# Patient Record
Sex: Female | Born: 1995 | Race: Black or African American | Hispanic: No | Smoking: Former smoker
Health system: Southern US, Community
[De-identification: ages and names within clinical notes are randomized; demographics above are authoritative.]

## PROBLEM LIST (undated history)

## (undated) ENCOUNTER — Inpatient Hospital Stay (HOSPITAL_COMMUNITY): Payer: Medicaid Other

## (undated) ENCOUNTER — Ambulatory Visit (HOSPITAL_COMMUNITY): Admission: EM | Disposition: A | Discharge status: Home / Self Care | Payer: Medicaid Other

## (undated) DIAGNOSIS — E119 Type 2 diabetes mellitus without complications: Secondary | ICD-10-CM

## (undated) DIAGNOSIS — F419 Anxiety disorder, unspecified: Secondary | ICD-10-CM

## (undated) DIAGNOSIS — N12 Tubulo-interstitial nephritis, not specified as acute or chronic: Secondary | ICD-10-CM

## (undated) DIAGNOSIS — N83209 Unspecified ovarian cyst, unspecified side: Secondary | ICD-10-CM

## (undated) DIAGNOSIS — R1011 Right upper quadrant pain: Secondary | ICD-10-CM

## (undated) DIAGNOSIS — E876 Hypokalemia: Secondary | ICD-10-CM

## (undated) DIAGNOSIS — E1110 Type 2 diabetes mellitus with ketoacidosis without coma: Secondary | ICD-10-CM

## (undated) HISTORY — PX: NO PAST SURGERIES: SHX2092

## (undated) HISTORY — DX: Right upper quadrant pain: R10.11

---
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
## 2017-01-26 ENCOUNTER — Other Ambulatory Visit: Payer: Self-pay | Admitting: Pediatrics

## 2017-01-26 DIAGNOSIS — E1065 Type 1 diabetes mellitus with hyperglycemia: Secondary | ICD-10-CM

## 2017-01-26 DIAGNOSIS — IMO0002 Reserved for concepts with insufficient information to code with codable children: Secondary | ICD-10-CM

## 2017-02-12 ENCOUNTER — Other Ambulatory Visit: Payer: Self-pay | Admitting: Internal Medicine

## 2017-02-13 ENCOUNTER — Telehealth: Payer: Self-pay | Admitting: Internal Medicine

## 2017-02-13 MED ORDER — INSULIN PEN NEEDLE 31G X 5 MM MISC: 2 refills | Status: DC

## 2017-02-13 NOTE — Telephone Encounter (Signed)
Called mother to discuss no shows and not making follow up appointments to manage DM.   The phone number on file is her mother's. She reports that patient has been going through a lot of things while here. She was being around people she shouldn't be around. Because of this she is currently with her father for the past 1.5 months.  Mother is unsure when she is back. Reports that she has been doing well with her cbgs which are under 160. The only highs is when she eats and does not take her insulin. Mother also reports that she needs more needles. Mother reports that she will speak to patient about coming in. She states that patient is still having a hard time with this diagnosis of DM1.   Mother provided patient's number.  Patient's Phone number: [phone]

## 2017-06-12 ENCOUNTER — Other Ambulatory Visit: Payer: Self-pay | Admitting: Internal Medicine

## 2017-06-12 NOTE — Telephone Encounter (Signed)
Will forward to MD to advise.  We have still been unable to reach patient to get her in the office for an appointment. Jazmin Hartsell,CMA

## 2017-06-13 ENCOUNTER — Emergency Department (HOSPITAL_COMMUNITY): Payer: Medicaid Other

## 2017-06-13 ENCOUNTER — Inpatient Hospital Stay (HOSPITAL_COMMUNITY)
Admission: EM | Admit: 2017-06-13 | Discharge: 2017-06-15 | DRG: 638 | Disposition: A | Discharge status: Home / Self Care | Payer: Medicaid Other | Attending: Family Medicine | Admitting: Family Medicine

## 2017-06-13 ENCOUNTER — Encounter (HOSPITAL_COMMUNITY): Payer: Self-pay

## 2017-06-13 ENCOUNTER — Encounter: Payer: Self-pay | Admitting: Internal Medicine

## 2017-06-13 ENCOUNTER — Other Ambulatory Visit: Payer: Self-pay

## 2017-06-13 DIAGNOSIS — E861 Hypovolemia: Secondary | ICD-10-CM | POA: Diagnosis present

## 2017-06-13 DIAGNOSIS — E109 Type 1 diabetes mellitus without complications: Secondary | ICD-10-CM | POA: Diagnosis not present

## 2017-06-13 DIAGNOSIS — F411 Generalized anxiety disorder: Secondary | ICD-10-CM | POA: Diagnosis present

## 2017-06-13 DIAGNOSIS — Z833 Family history of diabetes mellitus: Secondary | ICD-10-CM

## 2017-06-13 DIAGNOSIS — E1065 Type 1 diabetes mellitus with hyperglycemia: Secondary | ICD-10-CM

## 2017-06-13 DIAGNOSIS — D72828 Other elevated white blood cell count: Secondary | ICD-10-CM | POA: Diagnosis present

## 2017-06-13 DIAGNOSIS — E876 Hypokalemia: Secondary | ICD-10-CM | POA: Diagnosis present

## 2017-06-13 DIAGNOSIS — Z823 Family history of stroke: Secondary | ICD-10-CM

## 2017-06-13 DIAGNOSIS — N179 Acute kidney failure, unspecified: Secondary | ICD-10-CM | POA: Diagnosis present

## 2017-06-13 DIAGNOSIS — E1010 Type 1 diabetes mellitus with ketoacidosis without coma: Secondary | ICD-10-CM | POA: Diagnosis present

## 2017-06-13 DIAGNOSIS — E860 Dehydration: Secondary | ICD-10-CM | POA: Diagnosis present

## 2017-06-13 DIAGNOSIS — IMO0002 Reserved for concepts with insufficient information to code with codable children: Secondary | ICD-10-CM

## 2017-06-13 DIAGNOSIS — B009 Herpesviral infection, unspecified: Secondary | ICD-10-CM | POA: Insufficient documentation

## 2017-06-13 DIAGNOSIS — E108 Type 1 diabetes mellitus with unspecified complications: Secondary | ICD-10-CM | POA: Diagnosis present

## 2017-06-13 DIAGNOSIS — Z794 Long term (current) use of insulin: Secondary | ICD-10-CM | POA: Diagnosis not present

## 2017-06-13 HISTORY — DX: Type 2 diabetes mellitus with ketoacidosis without coma: E11.10

## 2017-06-13 HISTORY — DX: Hypokalemia: E87.6

## 2017-06-13 HISTORY — DX: Tubulo-interstitial nephritis, not specified as acute or chronic: N12

## 2017-06-13 LAB — I-STAT VENOUS BLOOD GAS, ED
Acid-base deficit: 17 mmol/L — ABNORMAL HIGH (ref 0.0–2.0)
BICARBONATE: 8.6 mmol/L — AB (ref 20.0–28.0)
O2 Saturation: 75 %
PCO2 VEN: 22.2 mmHg — AB (ref 44.0–60.0)
PH VEN: 7.195 — AB (ref 7.250–7.430)
PO2 VEN: 48 mmHg — AB (ref 32.0–45.0)
TCO2: 9 mmol/L — ABNORMAL LOW (ref 22–32)

## 2017-06-13 LAB — BASIC METABOLIC PANEL
ANION GAP: 10 (ref 5–15)
ANION GAP: 12 (ref 5–15)
Anion gap: 25 — ABNORMAL HIGH (ref 5–15)
Anion gap: 16 — ABNORMAL HIGH (ref 5–15)
BUN: 19 mg/dL (ref 6–20)
BUN: 16 mg/dL (ref 6–20)
BUN: 10 mg/dL (ref 6–20)
BUN: 8 mg/dL (ref 6–20)
BUN: 7 mg/dL (ref 6–20)
CHLORIDE: 89 mmol/L — AB (ref 101–111)
CHLORIDE: 98 mmol/L — AB (ref 101–111)
CO2: <7 mmol/L — ABNORMAL LOW (ref 22–32)
CO2: 10 mmol/L — ABNORMAL LOW (ref 22–32)
CO2: 15 mmol/L — ABNORMAL LOW (ref 22–32)
CO2: 14 mmol/L — ABNORMAL LOW (ref 22–32)
CO2: 13 mmol/L — ABNORMAL LOW (ref 22–32)
Calcium: 9.7 mg/dL (ref 8.9–10.3)
Calcium: 8.8 mg/dL — ABNORMAL LOW (ref 8.9–10.3)
Calcium: 7.6 mg/dL — ABNORMAL LOW (ref 8.9–10.3)
Calcium: 7.9 mg/dL — ABNORMAL LOW (ref 8.9–10.3)
Calcium: 8.5 mg/dL — ABNORMAL LOW (ref 8.9–10.3)
Chloride: 110 mmol/L (ref 101–111)
Chloride: 108 mmol/L (ref 101–111)
Chloride: 105 mmol/L (ref 101–111)
Creatinine, Ser: 1.88 mg/dL — ABNORMAL HIGH (ref 0.44–1.00)
Creatinine, Ser: 1.66 mg/dL — ABNORMAL HIGH (ref 0.44–1.00)
Creatinine, Ser: 1.02 mg/dL — ABNORMAL HIGH (ref 0.44–1.00)
Creatinine, Ser: 0.88 mg/dL (ref 0.44–1.00)
Creatinine, Ser: 0.92 mg/dL (ref 0.44–1.00)
GFR calc Af Amer: >60 mL/min (ref >60)
GFR, EST AFRICAN AMERICAN: 44 mL/min — AB (ref >60)
GFR, EST AFRICAN AMERICAN: 51 mL/min — AB (ref >60)
GFR, EST NON AFRICAN AMERICAN: 38 mL/min — AB (ref >60)
GFR, EST NON AFRICAN AMERICAN: 44 mL/min — AB (ref >60)
Glucose, Bld: 619 mg/dL (ref 65–99)
Glucose, Bld: 420 mg/dL — ABNORMAL HIGH (ref 65–99)
Glucose, Bld: 170 mg/dL — ABNORMAL HIGH (ref 65–99)
Glucose, Bld: 184 mg/dL — ABNORMAL HIGH (ref 65–99)
Glucose, Bld: 275 mg/dL — ABNORMAL HIGH (ref 65–99)
POTASSIUM: 3.7 mmol/L (ref 3.5–5.1)
POTASSIUM: 3.9 mmol/L (ref 3.5–5.1)
POTASSIUM: 3.8 mmol/L (ref 3.5–5.1)
POTASSIUM: 3.6 mmol/L (ref 3.5–5.1)
POTASSIUM: 3.7 mmol/L (ref 3.5–5.1)
SODIUM: 128 mmol/L — AB (ref 135–145)
SODIUM: 133 mmol/L — AB (ref 135–145)
SODIUM: 134 mmol/L — AB (ref 135–145)
SODIUM: 134 mmol/L — AB (ref 135–145)
Sodium: 135 mmol/L (ref 135–145)

## 2017-06-13 LAB — CBC WITH DIFFERENTIAL/PLATELET
BASOS ABS: 0 10*3/uL (ref 0.0–0.1)
BASOS PCT: 0 %
EOS ABS: 0 10*3/uL (ref 0.0–0.7)
EOS PCT: 0 %
HCT: 46 % (ref 36.0–46.0)
HEMOGLOBIN: 16.3 g/dL — AB (ref 12.0–15.0)
Lymphocytes Relative: 22 %
Lymphs Abs: 2.9 10*3/uL (ref 0.7–4.0)
MCH: 30.5 pg (ref 26.0–34.0)
MCHC: 35.4 g/dL (ref 30.0–36.0)
MCV: 86 fL (ref 78.0–100.0)
Monocytes Absolute: 0.5 10*3/uL (ref 0.1–1.0)
Monocytes Relative: 4 %
NEUTROS PCT: 74 %
Neutro Abs: 9.6 10*3/uL — ABNORMAL HIGH (ref 1.7–7.7)
PLATELETS: 331 10*3/uL (ref 150–400)
RBC: 5.35 MIL/uL — AB (ref 3.87–5.11)
RDW: 14.5 % (ref 11.5–15.5)
WBC: 13 10*3/uL — AB (ref 4.0–10.5)

## 2017-06-13 LAB — URINALYSIS, ROUTINE W REFLEX MICROSCOPIC
BILIRUBIN URINE: NEGATIVE
Glucose, UA: >=500 mg/dL — AB
KETONES UR: 80 mg/dL — AB
NITRITE: NEGATIVE
Protein, ur: 30 mg/dL — AB
SPECIFIC GRAVITY, URINE: 1.025 (ref 1.005–1.030)
pH: 5 (ref 5.0–8.0)

## 2017-06-13 LAB — CBG MONITORING, ED
GLUCOSE-CAPILLARY: 587 mg/dL — AB (ref 65–99)
GLUCOSE-CAPILLARY: 226 mg/dL — AB (ref 65–99)
GLUCOSE-CAPILLARY: 146 mg/dL — AB (ref 65–99)
GLUCOSE-CAPILLARY: 173 mg/dL — AB (ref 65–99)
GLUCOSE-CAPILLARY: 198 mg/dL — AB (ref 65–99)
Glucose-Capillary: 338 mg/dL — ABNORMAL HIGH (ref 65–99)
Glucose-Capillary: 158 mg/dL — ABNORMAL HIGH (ref 65–99)

## 2017-06-13 LAB — I-STAT BETA HCG BLOOD, ED (MC, WL, AP ONLY)

## 2017-06-13 LAB — GLUCOSE, CAPILLARY
Glucose-Capillary: 186 mg/dL — ABNORMAL HIGH (ref 65–99)
Glucose-Capillary: 186 mg/dL — ABNORMAL HIGH (ref 65–99)
Glucose-Capillary: 140 mg/dL — ABNORMAL HIGH (ref 65–99)

## 2017-06-13 MED ORDER — SODIUM CHLORIDE 0.9 % IV BOLUS (SEPSIS)
1000.0000 mL | Freq: Once | INTRAVENOUS | Status: AC
  Administered 2017-06-13: 1000 mL via INTRAVENOUS

## 2017-06-13 MED ORDER — INSULIN GLARGINE 100 UNIT/ML ~~LOC~~ SOLN
10.0000 [IU] | Freq: Every day | SUBCUTANEOUS | Status: DC
  Administered 2017-06-13 – 2017-06-14 (×2): 10 [IU] via SUBCUTANEOUS
  Filled 2017-06-13 (×2): qty 0.1

## 2017-06-13 MED ORDER — POTASSIUM CHLORIDE CRYS ER 20 MEQ PO TBCR
40.0000 meq | EXTENDED_RELEASE_TABLET | Freq: Once | ORAL | Status: AC
  Administered 2017-06-13: 40 meq via ORAL
  Filled 2017-06-13: qty 2

## 2017-06-13 MED ORDER — ENOXAPARIN SODIUM 40 MG/0.4ML ~~LOC~~ SOLN
40.0000 mg | SUBCUTANEOUS | Status: DC
  Administered 2017-06-13 – 2017-06-14 (×2): 40 mg via SUBCUTANEOUS
  Filled 2017-06-13 (×2): qty 0.4

## 2017-06-13 MED ORDER — DEXTROSE-NACL 5-0.45 % IV SOLN
INTRAVENOUS | Status: DC
  Administered 2017-06-13 (×2): via INTRAVENOUS

## 2017-06-13 MED ORDER — SODIUM CHLORIDE 0.9 % IV SOLN
INTRAVENOUS | Status: AC
  Administered 2017-06-13: 999 mL via INTRAVENOUS

## 2017-06-13 MED ORDER — LACTATED RINGERS IV BOLUS (SEPSIS): 1000.0000 mL | Freq: Once | INTRAVENOUS | Status: DC

## 2017-06-13 MED ORDER — POTASSIUM CHLORIDE 10 MEQ/100ML IV SOLN
10.0000 meq | INTRAVENOUS | Status: AC
  Administered 2017-06-13 (×2): 10 meq via INTRAVENOUS
  Filled 2017-06-13 (×2): qty 100

## 2017-06-13 MED ORDER — INSULIN ASPART 100 UNIT/ML ~~LOC~~ SOLN: 0.0000 [IU] | Freq: Three times a day (TID) | SUBCUTANEOUS | Status: DC

## 2017-06-13 MED ORDER — POTASSIUM CHLORIDE 10 MEQ/100ML IV SOLN: 10.0000 meq | INTRAVENOUS | Status: DC

## 2017-06-13 MED ORDER — SODIUM CHLORIDE 0.9 % IV SOLN
INTRAVENOUS | Status: DC
  Administered 2017-06-13: 125 mL/h via INTRAVENOUS
  Administered 2017-06-14: 09:00:00 via INTRAVENOUS

## 2017-06-13 MED ORDER — SODIUM CHLORIDE 0.9 % IV SOLN
INTRAVENOUS | Status: DC
  Administered 2017-06-13: 2.8 [IU]/h via INTRAVENOUS
  Filled 2017-06-13: qty 1

## 2017-06-13 MED ORDER — ONDANSETRON 4 MG PO TBDP
4.0000 mg | ORAL_TABLET | Freq: Four times a day (QID) | ORAL | Status: DC | PRN
  Administered 2017-06-13: 4 mg via ORAL
  Filled 2017-06-13 (×2): qty 1

## 2017-06-13 NOTE — H&P (Signed)
[facility] Admission History and Physical Service Pager: [phone]  Patient name: Sabrina Sutton Medical record number: [id number] Date of birth: 07/31/1995 Age: 21 y.o. Gender: female  Primary Care Provider: Smiley Houseman, MD Consultants: None  Code Status:  Full   Chief Complaint: Hyperglycemia   Assessment and Plan: Sabrina Sutton is a 21 y.o. female presenting with  . PMH is significant T1DM, hx of DKA, and hx of pyelonephritis   Moderate/Severe DKA: DKA likely induced by lack of patient taking her Lantus over the last 2 days. Alert and orienatated x 3, tachycardic to 120s, RR 18.  Bicarb less than 7, approximately  AG of 35, CBG  619, potassium of 3.7; VBG done 3 hours later with ph 7.195, bicarb 22. Due hx of 2 weeks of cough, getting over a cold,  chest xray obtained, negative for any acute findings. Leukocytosis and elevated hemoglobin likely due to severe dehydration.  - Admit to stepdown, attending Dr. McDiramid  -Insulin ggt, NS running at 150, transition to Dextrose containing saline at CBG of 250  -q1h CBGs  -q4h BMETs  -Transition to home Lantus when gap is closed x 2  -Watch potassium - 10 meq x1 in ED, replete as needed  -UA pending  -NPO for now  -vitals per floor  - Consider another fluid bolus if tachycardia does not improve - EKG -sinus tachycardia- poor quality, will repeat   T1DM: Last A1C 13.2 (2017) Per patient she is currently on a regimen of 26 units of Lantus daily, and NovoLog 10 units 3 times daily.  She sometimes will give an extra 10 units of NovoLog if she is having snacks.  Likely poor social circumstances patient lives with her sister and denies much motivation to come into clinic. -Obtain A1c -Diabetes teaching -Social work consult -Transtion home regiment when patient is ready  AKI: Likely due to dehydration from DKA, SCr 1.88, baseline Scr 1.00.  Has received 3 L bolus today, still tachycardic,  lips  chapped -Continue to follow serum creatinine -Will continue IV fluid replacement  FEN/GI: NPO Prophylaxis: Lovenox   Disposition: Home   History of Present Illness:  Sabrina Sutton is a 21 y.o. female presenting with hyperglycemia. Patient has been two day without her Lantus. She states she called her pharmacy two days ago and was waiting for refills. Patient states that on Monday blood sugars were slight elevated in the 200s without her blood sugars. Yesterday she developed vomiting and polyuria. Her blood sugars continued to rise into 300s and then greater. She used 16 units of novolog x3 times yesterday. Patient denies any symptoms of illness. States she has residual cough from a cold about two weeks ago. Patient is not sure why she has not been able to make to the clinic for medical appointments. She lives with her older sister. She has dropped out of high school. She would like to pursue a GED in the future. She states her mother is peripheral involved in her care.   Per ED when patient first came in she did have some Kussmaul respirations and seemed a little drowsy. However improved significantly after fluid rehydration. Patient alert and active, no longer with Kussmaul respirations at my assessment.   Review Of Systems: Per HPI with the following additions:   Review of Systems  Constitutional: Negative for chills and fever.  HENT: Negative for congestion and sore throat.   Eyes: Negative for pain and discharge.  Respiratory: Positive for cough. Negative for  sputum production and shortness of breath.   Cardiovascular: Positive for palpitations. Negative for chest pain.  Gastrointestinal: Positive for nausea and vomiting. Negative for abdominal pain and diarrhea.  Genitourinary: Positive for frequency. Negative for dysuria and urgency.  Musculoskeletal: Negative for myalgias and neck pain.  Neurological: Positive for weakness. Negative for headaches.    Patient Active Problem List    Diagnosis Date Noted  . Frequent No-show for appointment 11/03/2016  . Hyperglycemia due to type 1 diabetes mellitus ([place])   . Hypokalemia   . Pyelonephritis 04/17/2016  . Type 1 diabetes mellitus without complication ([place]) 67/34/1937  . Hypoglycemia due to type 1 diabetes mellitus ([place]) 11/19/2015  . Diabetic ketoacidosis without coma associated with type 1 diabetes mellitus ([place])   . Diabetes mellitus, new onset ([place])   . Dehydration   . DKA (diabetic ketoacidoses) ([place]) 09/20/2015    Past Medical History: Past Medical History:  Diagnosis Date  . Diabetes mellitus without complication ([place]) 03/26/39   + GAD Ab    Past Surgical History: History reviewed. No pertinent surgical history.  Social History: Social History   Tobacco Use  . Smoking status: Passive Smoke Exposure - Never Smoker  . Smokeless tobacco: Never Used  Substance Use Topics  . Alcohol use: Yes    Comment: Last drank 1 month ago per MD note  . Drug use: Yes    Types: Marijuana    Comment: Last used 1  month ago per MD note   Additional social history: Lives with older sister currently.  Please also refer to relevant sections of EMR.  Family History: Family History  Problem Relation Age of Onset  . Diabetes Maternal Grandmother   . Heart disease Maternal Grandmother        Deceased from MI at age 16  . Hypertension Maternal Grandmother   . Hypercholesterolemia Mother   . Seizures Mother   . Kidney Stones Mother   . Hyperlipidemia Mother   . Stroke Maternal Grandfather        Deceased from stroke at age 11  . Hypertension Paternal Grandmother   . Healthy Father     Allergies and Medications: No Known Allergies No current facility-administered medications on file prior to encounter.    Current Outpatient Medications on File Prior to Encounter  Medication Sig Dispense Refill  . ACCU-CHEK FASTCLIX LANCETS MISC Check sugar 10 x daily 304 each 4  . acetone, urine, test strip Check ketones per  protocol 50 each 3  . Alcohol Swabs (ALCOHOL PADS) 70 % PADS Use to wipe skin prior to insulin injections twice daily 200 each 6  . glucagon 1 MG injection Inject 1mg  IM if unconscious, seizing, or unable to eat to correct low blood sugar 2 each 2  . glucose blood (ACCU-CHEK GUIDE) test strip Check Bg 6x daily 200 each 12  . Insulin Pen Needle (B-D UF III MINI PEN NEEDLES) 31G X 5 MM MISC CHECK BLOOD SUGAR IN THE MORNING BEFORE EATING, BEFORE EACH MEAL, AND AS NEEDED 100 each 2  . NOVOLOG FLEXPEN 100 UNIT/ML FlexPen PLACE TAKE AS INSTRUCTED AT OFFICE VISIT( CURRENT DOSE 9 UNITS BEFORE MEALS) (Patient taking differently: INJECT 10 UNITS SUBCUTANEOUSLY THREE TIMES DAILY BEFORE MEALS) 15 mL 0  . acyclovir (ZOVIRAX) 400 MG tablet Take 0.5 tablets (200 mg total) by mouth 5 (five) times daily. (Patient not taking: Reported on 06/13/2017) 25 tablet 0  . imiquimod (ALDARA) 5 % cream Apply topically 3 (three) times a week. Apply a  thin layer 3 times per week (on alternate days) prior to bedtime, levae on skin for 6-10 hours, then remove with mild soap and water. Continue until there is total clearance of the genital, perianal warts or for a maximum duration of therapy of 16 weeks. (Patient not taking: Reported on 06/13/2017) 24 each 0  . LANTUS SOLOSTAR 100 UNIT/ML Solostar Pen INJECT 26 UNITS UNDER THE SKIN DAILY OR AS DIRECTED 15 mL 0    Objective: BP 117/83   Pulse (!) 127   Temp 98.3 F (36.8 C) (Oral)   Resp (!) 22   LMP 06/10/2017   SpO2 100%  Exam: Physical Exam  Constitutional: She is oriented to person, place, and time and well-developed, well-nourished, and in no distress.  HENT:  Head: Normocephalic and atraumatic.  Mouth/Throat: Oropharynx is clear and moist.  Mucous membranes are dry   Eyes: Conjunctivae are normal. Pupils are equal, round, and reactive to light.  Neck: Normal range of motion. Neck supple.  Cardiovascular: Regular rhythm, normal heart sounds and intact distal pulses.   tachycardic  Pulmonary/Chest: Effort normal and breath sounds normal.  Abdominal: Soft. Bowel sounds are normal.  Musculoskeletal: Normal range of motion. She exhibits no edema or tenderness.  Neurological: She is alert and oriented to person, place, and time.  Skin: Skin is warm and dry.  Psychiatric: Affect and judgment normal.    Labs and Imaging: CBC BMET  Recent Labs  Lab 06/13/17 0744  WBC 13.0*  HGB 16.3*  HCT 46.0  PLT 331   Recent Labs  Lab 06/13/17 0744  NA 128*  K 3.7  CL 89*  CO2 <7*  BUN 19  CREATININE 1.88*  GLUCOSE 619*  CALCIUM 9.7     Dg Chest 2 View  Result Date: 06/13/2017 CLINICAL DATA:  Productive cough for 2 days EXAM: CHEST  2 VIEW COMPARISON:  04/17/2016 FINDINGS: The heart size and mediastinal contours are within normal limits. Both lungs are clear. The visualized skeletal structures are unremarkable. IMPRESSION: No active cardiopulmonary disease. Electronically Signed   By: Inez Catalina M.D.   On: 06/13/2017 08:41    EKG - sinus tachycardia  Tonette Bihari, MD 06/13/2017, 10:06 AM PGY-3, [place] [facility] pager: [phone], text pages welcome

## 2017-06-13 NOTE — ED Notes (Signed)
CBG 198 

## 2017-06-13 NOTE — ED Notes (Signed)
I-Stat venous blood gas to be redrawn due to bad sample.

## 2017-06-13 NOTE — Progress Notes (Signed)
Social Visit:   I tried to get in contact with Sabrina Sutton this morning after seeing a refill request for Lantus. Her mother answered the phone and reported that she is currently in the hospital.   I went to see patient who is in the process of being admitted to the hospital for DKA. For the past few months we have been having a difficult time getting in contact with her to try to make a follow up appointment in clinic. I discussed this with her this morning. She reports she does understand the importance of this. She is living with her sister and reports everything is fine at home. She now has her own cell phone and I updated the contact information in her chart. She reports that she was told she will be admitted to the ICU from the ED. I told her that our inpatient team will be more than happy to take over care after she is transfered out from the ICU.

## 2017-06-13 NOTE — ED Provider Notes (Signed)
[facility] EMERGENCY DEPARTMENT Provider Note   CSN: [id number] Arrival date & time: 06/13/17  0865     History   Chief Complaint Chief Complaint  Patient presents with  . Emesis    HPI Sabrina Rautio is a 21 y.o. female.  Patient with history of type 1 diabetes diagnosed approximately 1 year ago, treating at home with IM insulin --presents with vomiting, cough over the past 2 days.  Patient states that she has not been taking her insulin during this time.  No fevers or URI symptoms.  She has occasional nonproductive cough.  No abdominal pain or diarrhea.  No urinary symptoms.  She states that she has been very thirsty and urinating a lot. The onset of this condition was acute. The course is worsening. Aggravating factors: none. Alleviating factors: none.        Past Medical History:  Diagnosis Date  . Diabetes mellitus without complication ([place]) 7/84/69   + GAD Ab    Patient Active Problem List   Diagnosis Date Noted  . Frequent No-show for appointment 11/03/2016  . Hyperglycemia due to type 1 diabetes mellitus ([place])   . Hypokalemia   . Pyelonephritis 04/17/2016  . Type 1 diabetes mellitus without complication ([place]) 62/95/2841  . Hypoglycemia due to type 1 diabetes mellitus ([place]) 11/19/2015  . Diabetic ketoacidosis without coma associated with type 1 diabetes mellitus ([place])   . Diabetes mellitus, new onset ([place])   . Dehydration   . DKA (diabetic ketoacidoses) ([place]) 09/20/2015    History reviewed. No pertinent surgical history.  OB History    No data available       Home Medications    Prior to Admission medications   Medication Sig Start Date End Date Taking? Authorizing Provider  ACCU-CHEK FASTCLIX LANCETS MISC Check sugar 10 x daily 09/23/15   Levon Hedger, MD  acetone, urine, test strip Check ketones per protocol 09/23/15   Levon Hedger, MD  acyclovir (ZOVIRAX) 400 MG tablet Take 0.5 tablets (200 mg total) by  mouth 5 (five) times daily. 08/06/16   Orlie Dakin, MD  Alcohol Swabs (ALCOHOL PADS) 70 % PADS Use to wipe skin prior to insulin injections twice daily 09/23/15   Levon Hedger, MD  glucagon 1 MG injection Inject 1mg  IM if unconscious, seizing, or unable to eat to correct low blood sugar 09/23/15   Levon Hedger, MD  glucose blood (ACCU-CHEK GUIDE) test strip Check Bg 6x daily 11/19/15   Levon Hedger, MD  imiquimod Leroy [place]) 5 % cream Apply topically 3 (three) times a week. Apply a thin layer 3 times per week (on alternate days) prior to bedtime, levae on skin for 6-10 hours, then remove with mild soap and water. Continue until there is total clearance of the genital, perianal warts or for a maximum duration of therapy of 16 weeks. 08/25/16   Gareth Morgan, MD  Insulin Pen Needle (B-D UF III MINI PEN NEEDLES) 31G X 5 MM MISC CHECK BLOOD SUGAR IN THE MORNING BEFORE EATING, BEFORE EACH MEAL, AND AS NEEDED 02/13/17   Smiley Houseman, MD  LANTUS SOLOSTAR 100 UNIT/ML Solostar Pen INJECT 26 UNITS UNDER THE SKIN DAILY OR AS DIRECTED 02/13/17   Smiley Houseman, MD  NOVOLOG FLEXPEN 100 UNIT/ML FlexPen PLACE TAKE AS INSTRUCTED AT OFFICE VISIT( CURRENT DOSE 9 UNITS BEFORE MEALS) 02/13/17   Smiley Houseman, MD    Family History Family History  Problem Relation Age of Onset  .  Diabetes Maternal Grandmother   . Heart disease Maternal Grandmother        Deceased from MI at age 32  . Hypertension Maternal Grandmother   . Hypercholesterolemia Mother   . Seizures Mother   . Kidney Stones Mother   . Hyperlipidemia Mother   . Stroke Maternal Grandfather        Deceased from stroke at age 33  . Hypertension Paternal Grandmother   . Healthy Father     Social History Social History   Tobacco Use  . Smoking status: Passive Smoke Exposure - Never Smoker  . Smokeless tobacco: Never Used  Substance Use Topics  . Alcohol use: Yes    Comment: Last drank 1 month ago per  MD note  . Drug use: Yes    Types: Marijuana    Comment: Last used 1  month ago per MD note     Allergies   Patient has no known allergies.   Review of Systems Review of Systems  Constitutional: Negative for fever.  HENT: Negative for rhinorrhea and sore throat.   Eyes: Negative for redness.  Respiratory: Positive for cough. Negative for shortness of breath.   Cardiovascular: Negative for chest pain.  Gastrointestinal: Positive for nausea and vomiting. Negative for abdominal pain and diarrhea.  Genitourinary: Negative for dysuria.  Musculoskeletal: Negative for myalgias.  Skin: Negative for rash.  Neurological: Negative for headaches.     Physical Exam Updated Vital Signs BP 125/83 (BP Location: Right Arm)   Pulse (!) 25   Temp 98.3 F (36.8 C) (Oral)   Resp (!) 31   SpO2 (!) 89%   Physical Exam  Constitutional: She appears well-developed and well-nourished.  HENT:  Head: Normocephalic and atraumatic.  Mouth/Throat: Oropharynx is clear and moist.  Eyes: Conjunctivae are normal. Right eye exhibits no discharge. Left eye exhibits no discharge.  Neck: Normal range of motion. Neck supple.  Cardiovascular: Regular rhythm and normal heart sounds. Tachycardia present.  Pulmonary/Chest: Effort normal and breath sounds normal. No stridor. Tachypnea noted. No respiratory distress. She has no wheezes.  Abdominal: Soft. There is no tenderness.  Neurological: She is alert.  Skin: Skin is warm and dry.  Psychiatric: She has a normal mood and affect.  Nursing note and vitals reviewed.    ED Treatments / Results  Labs (all labs ordered are listed, but only abnormal results are displayed) Labs Reviewed  CBC WITH DIFFERENTIAL/PLATELET - Abnormal; Notable for the following components:      Result Value   WBC 13.0 (*)    RBC 5.35 (*)    Hemoglobin 16.3 (*)    Neutro Abs 9.6 (*)    All other components within normal limits  BASIC METABOLIC PANEL - Abnormal; Notable for the  following components:   Sodium 128 (*)    Chloride 89 (*)    CO2 <7 (*)    Glucose, Bld 619 (*)    Creatinine, Ser 1.88 (*)    GFR calc non Af Amer 38 (*)    GFR calc Af Amer 44 (*)    All other components within normal limits  CBG MONITORING, ED - Abnormal; Notable for the following components:   Glucose-Capillary 587 (*)    All other components within normal limits  URINALYSIS, ROUTINE W REFLEX MICROSCOPIC  I-STAT BETA HCG BLOOD, ED ([facility], [facility], AP ONLY)  I-STAT VENOUS BLOOD GAS, ED    EKG  EKG Interpretation  Date/Time:  Wednesday June 13 2017 07:47:54 EST Ventricular Rate:  142  PR Interval:    QRS Duration: 80 QT Interval:  320 QTC Calculation: 491 R Axis:   86 Text Interpretation:  Sinus tachycardia Multiple ventricular premature complexes Consider right atrial enlargement Borderline prolonged QT interval Rate faster Confirmed by Ezequiel Essex [phone]) on 06/13/2017 7:53:03 AM       Radiology No results found.  Procedures Procedures (including critical care time)  Medications Ordered in ED Medications  sodium chloride 0.9 % bolus 1,000 mL (not administered)  sodium chloride 0.9 % bolus 1,000 mL (not administered)     Initial Impression / Assessment and Plan / ED Course  I have reviewed the triage vital signs and the nursing notes.  Pertinent labs & imaging results that were available during my care of the patient were reviewed by me and considered in my medical decision making (see chart for details).     Patient seen and examined.   Vital signs reviewed and are as follows: BP 125/83 (BP Location: Right Arm)   Pulse (!) 25   Temp 98.3 F (36.8 C) (Oral)   Resp (!) 31   SpO2 (!) 89%    Seen with Dr. Wyvonnia Dusky.   9:05 AM CXR clear. AG>33, AKI. Insulin and K repletion ordered. Additional 1L LR ordered for total of 3L.   Will call [facility] for admission. Pt aware. States she is starting to feel better. Clinically respirations have improved.    [facility]  aware and will admit to stepdown.      CRITICAL CARE Performed by: Faustino Congress Total critical care time: 40 minutes Critical care time was exclusive of separately billable procedures and treating other patients. Critical care was necessary to treat or prevent imminent or life-threatening deterioration. Critical care was time spent personally by me on the following activities: development of treatment plan with patient and/or surrogate as well as nursing, discussions with consultants, evaluation of patient's response to treatment, examination of patient, obtaining history from patient or surrogate, ordering and performing treatments and interventions, ordering and review of laboratory studies, ordering and review of radiographic studies, pulse oximetry and re-evaluation of patient's condition.    Final Clinical Impressions(s) / ED Diagnoses   Final diagnoses:  Diabetic ketoacidosis without coma associated with type 1 diabetes mellitus ([place])   Admit.   ED Discharge Orders    None       Carlisle Cater, Hershal Coria 06/13/17 1227    Ezequiel Essex, MD 06/14/17 [phone]

## 2017-06-13 NOTE — Telephone Encounter (Signed)
Called home phone and mother picked up.   Mother reports that patient is living with her sister. The sister called the mother today to report that patient is in the ICU at [facility] and has renal failure. Mother reports that she has been telling patient to come to be seen in clinic but "she is being very stubborn". Mother reports that she apparently has not been taking her medications for the past few days.

## 2017-06-13 NOTE — Progress Notes (Signed)
Patient threw up after eating dinner after being NPO all day. Paged MD to notify and orders for PRN zofran to be written by MD.

## 2017-06-13 NOTE — ED Triage Notes (Signed)
Pt states she has been vomiting for 2 days. She states she is diabetic and has been out of meds for 2 days. Unable to check her sugar at home. Pt very weak. AOX4.

## 2017-06-13 NOTE — Progress Notes (Addendum)
Inpatient Diabetes Program Recommendations  AACE/ADA: New Consensus Statement on Inpatient Glycemic Control (2015)  Target Ranges:  Prepandial:   less than 140 mg/dL      Peak postprandial:   less than 180 mg/dL (1-2 hours)      Critically ill patients:  140 - 180 mg/dL   Lab Results  Component Value Date   GLUCAP 158 (H) 06/13/2017   HGBA1C 13.2 (H) 04/17/2016    Spoke with patient at bedside to discuss reasons related to her admission. Patient was diagnosed with Diabetes about 1 year ago. Patient reports she ran out of insulin and tried to get the the clinics phone number from her mom but could not get up with her mom. In past notes patient does not have her own phone. Spoke with patient about looking the number up on the Internet and calling. Spoke with patient about the back up option of insulin at [facility] she had an emergency or was out of town in the future. Patient reports not knowing what her A1c number was but said she thought it was within a good range. Patient says she checks her glucose 3 times a day with meals and it usually reads in the 100 level. Last A1c on file was 13.2% last year on 04/17/16, I spoke with her at that time in regards to her glucose control. Patient had seen Dr. Charna Archer in the past with the pediatric sub-specialists Endocrinology group. Per Dr. Grover Canavan note patient was diagnosed with DM type 1 on 09/20/15. Patient has seen Pharmacist, Dr. Darrick Meigs for DM management/adjustments/education. Note Current A1c in process.  Thanks,  Tama Headings RN, MSN, [facility] Inpatient Diabetes Coordinator Team Pager [phone] (8a-5p)

## 2017-06-13 NOTE — Progress Notes (Signed)
Spoke to Dr Rosalyn Gess, he stated he would enter orders for sliding scale every 4 hours.

## 2017-06-13 NOTE — ED Notes (Signed)
Attempted IV stick x 2 unsuccessfully.  

## 2017-06-14 DIAGNOSIS — E860 Dehydration: Secondary | ICD-10-CM

## 2017-06-14 DIAGNOSIS — E109 Type 1 diabetes mellitus without complications: Secondary | ICD-10-CM

## 2017-06-14 DIAGNOSIS — N179 Acute kidney failure, unspecified: Secondary | ICD-10-CM

## 2017-06-14 LAB — HEMOGLOBIN A1C
Hgb A1c MFr Bld: >15.5 % — ABNORMAL HIGH (ref 4.8–5.6)
Mean Plasma Glucose: >398 mg/dL

## 2017-06-14 LAB — BASIC METABOLIC PANEL
ANION GAP: 20 — AB (ref 5–15)
ANION GAP: 19 — AB (ref 5–15)
ANION GAP: 10 (ref 5–15)
Anion gap: 19 — ABNORMAL HIGH (ref 5–15)
BUN: 8 mg/dL (ref 6–20)
BUN: 7 mg/dL (ref 6–20)
BUN: 6 mg/dL (ref 6–20)
CHLORIDE: 102 mmol/L (ref 101–111)
CHLORIDE: 104 mmol/L (ref 101–111)
CHLORIDE: 108 mmol/L (ref 101–111)
CO2: 8 mmol/L — AB (ref 22–32)
CO2: 7 mmol/L — AB (ref 22–32)
CO2: 9 mmol/L — AB (ref 22–32)
CO2: 15 mmol/L — ABNORMAL LOW (ref 22–32)
Calcium: 8.4 mg/dL — ABNORMAL LOW (ref 8.9–10.3)
Calcium: 8.8 mg/dL — ABNORMAL LOW (ref 8.9–10.3)
Calcium: 8.7 mg/dL — ABNORMAL LOW (ref 8.9–10.3)
Calcium: 8.6 mg/dL — ABNORMAL LOW (ref 8.9–10.3)
Chloride: 105 mmol/L (ref 101–111)
Creatinine, Ser: 1.2 mg/dL — ABNORMAL HIGH (ref 0.44–1.00)
Creatinine, Ser: 1.29 mg/dL — ABNORMAL HIGH (ref 0.44–1.00)
Creatinine, Ser: 1.18 mg/dL — ABNORMAL HIGH (ref 0.44–1.00)
Creatinine, Ser: 0.79 mg/dL (ref 0.44–1.00)
GFR calc Af Amer: >60 mL/min (ref >60)
GFR calc Af Amer: >60 mL/min (ref >60)
GFR calc Af Amer: >60 mL/min (ref >60)
GFR calc non Af Amer: >60 mL/min (ref >60)
GFR calc non Af Amer: 60 mL/min — ABNORMAL LOW (ref >60)
GFR calc non Af Amer: >60 mL/min (ref >60)
GFR calc non Af Amer: >60 mL/min (ref >60)
GLUCOSE: 417 mg/dL — AB (ref 65–99)
GLUCOSE: 281 mg/dL — AB (ref 65–99)
GLUCOSE: 200 mg/dL — AB (ref 65–99)
Glucose, Bld: 271 mg/dL — ABNORMAL HIGH (ref 65–99)
POTASSIUM: 4.2 mmol/L (ref 3.5–5.1)
POTASSIUM: 3.8 mmol/L (ref 3.5–5.1)
POTASSIUM: 3.5 mmol/L (ref 3.5–5.1)
POTASSIUM: 3.5 mmol/L (ref 3.5–5.1)
SODIUM: 132 mmol/L — AB (ref 135–145)
SODIUM: 133 mmol/L — AB (ref 135–145)
Sodium: 130 mmol/L — ABNORMAL LOW (ref 135–145)
Sodium: 131 mmol/L — ABNORMAL LOW (ref 135–145)

## 2017-06-14 LAB — GLUCOSE, CAPILLARY
GLUCOSE-CAPILLARY: 434 mg/dL — AB (ref 65–99)
GLUCOSE-CAPILLARY: 207 mg/dL — AB (ref 65–99)
GLUCOSE-CAPILLARY: 244 mg/dL — AB (ref 65–99)
Glucose-Capillary: 175 mg/dL — ABNORMAL HIGH (ref 65–99)
Glucose-Capillary: 249 mg/dL — ABNORMAL HIGH (ref 65–99)

## 2017-06-14 MED ORDER — POTASSIUM CHLORIDE CRYS ER 20 MEQ PO TBCR
40.0000 meq | EXTENDED_RELEASE_TABLET | Freq: Once | ORAL | Status: AC
  Administered 2017-06-14: 40 meq via ORAL
  Filled 2017-06-14: qty 2

## 2017-06-14 MED ORDER — INSULIN GLARGINE 100 UNIT/ML ~~LOC~~ SOLN
5.0000 [IU] | Freq: Once | SUBCUTANEOUS | Status: AC
  Administered 2017-06-14: 5 [IU] via SUBCUTANEOUS
  Filled 2017-06-14: qty 0.05

## 2017-06-14 MED ORDER — INSULIN ASPART 100 UNIT/ML ~~LOC~~ SOLN
11.0000 [IU] | Freq: Once | SUBCUTANEOUS | Status: AC
  Administered 2017-06-14: 11 [IU] via SUBCUTANEOUS

## 2017-06-14 MED ORDER — SODIUM CHLORIDE 0.9 % IV BOLUS (SEPSIS)
1000.0000 mL | Freq: Once | INTRAVENOUS | Status: AC
  Administered 2017-06-14: 1000 mL via INTRAVENOUS

## 2017-06-14 MED ORDER — INSULIN ASPART 100 UNIT/ML ~~LOC~~ SOLN
3.0000 [IU] | Freq: Three times a day (TID) | SUBCUTANEOUS | Status: DC
  Administered 2017-06-14 (×2): 3 [IU] via SUBCUTANEOUS

## 2017-06-14 MED ORDER — INSULIN ASPART 100 UNIT/ML ~~LOC~~ SOLN
0.0000 [IU] | Freq: Three times a day (TID) | SUBCUTANEOUS | Status: DC
  Administered 2017-06-14: 7 [IU] via SUBCUTANEOUS

## 2017-06-14 MED ORDER — INSULIN ASPART 100 UNIT/ML ~~LOC~~ SOLN
0.0000 [IU] | Freq: Three times a day (TID) | SUBCUTANEOUS | Status: DC
  Administered 2017-06-14: 5 [IU] via SUBCUTANEOUS
  Administered 2017-06-14: 3 [IU] via SUBCUTANEOUS

## 2017-06-14 MED ORDER — SODIUM CHLORIDE 0.9 % IV SOLN
INTRAVENOUS | Status: DC
  Administered 2017-06-14 – 2017-06-15 (×2): via INTRAVENOUS

## 2017-06-14 MED ORDER — INSULIN GLARGINE 100 UNIT/ML ~~LOC~~ SOLN
15.0000 [IU] | Freq: Every day | SUBCUTANEOUS | Status: DC
  Filled 2017-06-14: qty 0.15

## 2017-06-14 NOTE — Plan of Care (Signed)
Patient nauseated tonight. Vomited after eating. Zofran PRN given with some relief. Encouraged patient to slowly advance diet. Will continue to monitor.

## 2017-06-14 NOTE — Discharge Summary (Signed)
[facility] Discharge Summary  Patient name: Sabrina Sutton Medical record number: [id number] Date of birth: Apr 15, 1996 Age: 21 y.o. Gender: female Date of Admission: 06/13/2017  Date of Discharge: 06/15/2017 Admitting Physician: Blane Ohara McDiarmid, MD  Primary Care Provider: Smiley Houseman, MD Consultants: None  Indication for Hospitalization: DKA  Discharge Diagnoses/Problem List:  Principal Problem:   Type 1 diabetes mellitus without complication ([place]) Active Problems:   Dehydration   DKA, type 1 ([place])   Acute kidney injury ([place])  Disposition: Home  Discharge Condition: Improved  Discharge Exam:  Temp:  [97.9 F (36.6 C)-98.9 F (37.2 C)] 97.9 F (36.6 C) (11/23 0532) Pulse Rate:  [85-113] 85 (11/23 0532) Resp:  [16-18] 17 (11/23 0027) BP: (102-119)/(66-79) 102/72 (11/23 0532) SpO2:  [97 %-100 %] 100 % (11/23 0532) Weight:  [129 lb 3.2 oz (58.6 kg)] 129 lb 3.2 oz (58.6 kg) (11/23 0532) Physical Exam: General: Thin young woman, resting in bed; AOx3 HENT: MMM Cardiovascular: RRR, no m/r/g; cap refill < 3 sec Respiratory: CTAB, no increased WOB Abdomen: +BS, soft, NT, ND Extremities: warm, moves all spontaneously  Brief Hospital Course:  Sabrina Sutton is a 21-y/o female with history of T1DM (diagnosed in 2017) and history of pyelonephritis who presented in moderate/severe DKA in the setting of running out of insulin for 2 days. She denied feeling unwell prior to running out of insulin. She was tachycardic and had leukocytosis and elevated hgb, thought to be 2/2 dehydration. She was placed on insulin drip and transitioned the evening of 11/21. She had nausea and vomiting overnight 11/21 and continued to be tachycardic 06/14/17. Anion gap remained closed 11/22-11/23. Patient able to tolerate PO by time of discharge. She was counseled by a diabetes coordinator while admitted; ways to avoid running out of insulin were reviewed.  Patient's A1c was significantly elevated at >15.5. However, she is able to correctly recount her insulin regimen and denies missing doses, so more frequent outpatient follow-up will be very important. New glucometer was ordered to patient's pharmacy along with other blood sugar monitoring supplies, as she reports having run out.   Issues for Follow Up:  1. Ensure patient has received diabetes supplies and glucometer 2. F/u urine culture --> multiple species present 3. Consider [facility] consult at follow-up to investigate further resources that may be helpful for patient to better manage her diabetes; had been seen previously by pediatric endocrinology  Significant Procedures:   Significant Labs and Imaging:  Recent Labs  Lab 06/13/17 0744 06/15/17 0356  WBC 13.0* 7.0  HGB 16.3* 13.1  HCT 46.0 38.4  PLT 331 211   Recent Labs  Lab 06/14/17 0150 06/14/17 0544 06/14/17 0955 06/14/17 1438 06/15/17 0356  NA 130* 131* 132* 133* 133*  K 4.2 3.8 3.5 3.5 3.3*  CL 102 105 104 108 104  CO2 8* 7* 9* 15* 20*  GLUCOSE 417* 281* 271* 200* 315*  BUN _0 <5* <5*  CREATININE 1.20* 1.29* 1.18* 0.79 0.74  CALCIUM 8.4* 8.8* 8.7* 8.6* 8.4*   Dg Chest 2 View  Result Date: 06/13/2017 CLINICAL DATA:  Productive cough for 2 days EXAM: CHEST  2 VIEW COMPARISON:  04/17/2016 FINDINGS: The heart size and mediastinal contours are within normal limits. Both lungs are clear. The visualized skeletal structures are unremarkable. IMPRESSION: No active cardiopulmonary disease. Electronically Signed   By: Inez Catalina M.D.   On: 06/13/2017 08:41   Results/Tests Pending at Time of Discharge: Urine culture  Discharge Medications:  Allergies as of 06/15/2017   No Known Allergies     Medication List    STOP taking these medications   acyclovir 400 MG tablet Commonly known as:  ZOVIRAX   imiquimod 5 % cream Commonly known as:  ALDARA     TAKE these medications   ACCU-CHEK AVIVA PLUS w/Device Kit 1 Device  by Does not apply route once for 1 dose.   ACCU-CHEK FASTCLIX LANCET Kit 1 Device by Does not apply route once for 1 dose.   ACCU-CHEK FASTCLIX LANCETS Misc Check sugar 10 x daily   acetone (urine) test strip Check ketones per protocol   Alcohol Pads 70 % Pads Use to wipe skin prior to insulin injections twice daily   glucagon 1 MG injection Inject 23m IM if unconscious, seizing, or unable to eat to correct low blood sugar   glucose blood test strip Commonly known as:  ACCU-CHEK AVIVA PLUS Use as instructed What changed:  additional instructions   insulin aspart 100 UNIT/ML FlexPen Commonly known as:  NOVOLOG FLEXPEN INJECT 10 UNITS SUBCUTANEOUSLY THREE TIMES DAILY BEFORE MEALS   Insulin Glargine 100 UNIT/ML Solostar Pen Commonly known as:  LANTUS SOLOSTAR INJECT 26 UNITS UNDER THE SKIN DAILY OR AS DIRECTED   Insulin Pen Needle 31G X 5 MM Misc Commonly known as:  B-D UF III MINI PEN NEEDLES CHECK BLOOD SUGAR IN THE MORNING BEFORE EATING, BEFORE EACH MEAL, AND AS NEEDED   ondansetron 4 MG disintegrating tablet Commonly known as:  ZOFRAN-ODT Take 1 tablet (4 mg total) by mouth every 6 (six) hours as needed for nausea or vomiting.       Discharge Instructions: Please refer to Patient Instructions section of EMR for full details.  Patient was counseled important signs and symptoms that should prompt return to medical care, changes in medications, dietary instructions, activity restrictions, and follow up appointments.   Follow-Up Appointments: Follow-up Information    FRogue Bussing MD. Go on 06/18/2017.   Specialty:  Family Medicine Why:  hospital follow-up appointment at 2:50 pm. Please call if need to reschedule.  Contact information: [phone]           FRogue Bussing MD 06/15/2017, 11:52 AM PGY-3, [place]

## 2017-06-14 NOTE — Progress Notes (Signed)
Family Medicine Teaching Service Daily Progress Note Intern Pager: [phone]  Patient name: Sabrina Sutton Medical record number: [id number] Date of birth: Oct 01, 1995 Age: 21 y.o. Gender: female  Primary Care Provider: Smiley Houseman, MD Consultants:  Code Status:   Pt Overview and Major Events to Date:   Assessment and Plan: Sabrina Sutton is a 21 y.o. female presenting with DKA. PMH is significant T1DM, hx of DKA, and hx of pyelonephritis   Moderate/Severe DKA: DKA likely induced by lack of patient taking her Lantus the 2 days prior to admission. CBG 207 this am but BMP with anion gap of 19; patient tachycardic and vomited overnight so still hypovolemic. UA with hgb and leuks but no nitrites; patient does not endorse abdominal pain or dysuria.  - Transition from SDU to med-surg if pm BMP shows closed AG - lantus 10 U nightly as just starting to eat again this a.m. - will increase SSI to resistant scale - urine cx - vitals per floor  - will give 1L fluid bolus and start IVFs at 100 cc/hr  T1DM: Last A1C 13.2 (2017) Per patient she is currently on a regimen of 26 units of Lantus daily, and NovoLog 10 units 3 times daily.  She sometimes will give an extra 10 units of NovoLog if she is having snacks.  Likely poor social circumstances patient lives with her sister and denies much motivation to come into clinic. - worsening A1c at >15.5 -Diabetes teaching -Social work consult -Transtion home regiment when patient is ready (would like to see eating more consistently)  AKI: SCr improved to 1.18. Likely due to dehydration from DKA, SCr 1.88 on admission, baseline Scr 1.00.   -Continue to follow serum creatinine -Will continue IV fluid replacement  FEN/GI: carb-modified diet Prophylaxis: Lovenox   Disposition: home pending improvement in PO intake and CBGs, possibly tomorrow (11/23)  Subjective:  Patient able to tolerate PO this am. She did have several episodes of  vomiting overnight. Denies abdominal pain or nausea at present.   Objective: Temp:  [97.6 F (36.4 C)-98.9 F (37.2 C)] 97.8 F (36.6 C) (11/22 0546) Pulse Rate:  [100-139] 118 (11/22 0546) Resp:  [15-25] 22 (11/22 0546) BP: (99-122)/(56-87) 118/87 (11/22 0546) SpO2:  [96 %-100 %] 100 % (11/22 0546) Weight:  [120 lb 11.2 oz (54.7 kg)-123 lb 10.9 oz (56.1 kg)] 120 lb 11.2 oz (54.7 kg) (11/22 0546) Physical Exam: General: Thin young woman, resting in bed; AOx3 HENT: MMM Cardiovascular: Tachycardic, regular, no m/r/g; cap refill < 3 sec Respiratory: CTAB, no increased WOB Abdomen: +BS, soft, NT, ND Extremities: warm, moves all spontaneously  Laboratory: Recent Labs  Lab 06/13/17 0744  WBC 13.0*  HGB 16.3*  HCT 46.0  PLT 331   Recent Labs  Lab 06/13/17 2214 06/14/17 0150 06/14/17 0544  NA 134* 130* 131*  K 3.7 4.2 3.8  CL 105 102 105  CO2 13* 8* 7*  BUN 7 8 7   CREATININE 0.92 1.20* 1.29*  CALCIUM 8.5* 8.4* 8.8*  GLUCOSE 275* 417* 281*    Imaging/Diagnostic Tests: Dg Chest 2 View  Result Date: 06/13/2017 CLINICAL DATA:  Productive cough for 2 days EXAM: CHEST  2 VIEW COMPARISON:  04/17/2016 FINDINGS: The heart size and mediastinal contours are within normal limits. Both lungs are clear. The visualized skeletal structures are unremarkable. IMPRESSION: No active cardiopulmonary disease. Electronically Signed   By: Inez Catalina M.D.   On: 06/13/2017 08:41    Rogue Bussing, MD 06/14/2017, 7:56 AM PGY-3,  [place] Intern pager: [phone], text pages welcome

## 2017-06-14 NOTE — Progress Notes (Signed)
Paged on call MD for CBG result of 434.

## 2017-06-15 DIAGNOSIS — E1010 Type 1 diabetes mellitus with ketoacidosis without coma: Principal | ICD-10-CM

## 2017-06-15 LAB — CBC
HEMATOCRIT: 38.4 % (ref 36.0–46.0)
HEMOGLOBIN: 13.1 g/dL (ref 12.0–15.0)
MCH: 29.2 pg (ref 26.0–34.0)
MCHC: 34.1 g/dL (ref 30.0–36.0)
MCV: 85.5 fL (ref 78.0–100.0)
Platelets: 211 10*3/uL (ref 150–400)
RBC: 4.49 MIL/uL (ref 3.87–5.11)
RDW: 14.7 % (ref 11.5–15.5)
WBC: 7 10*3/uL (ref 4.0–10.5)

## 2017-06-15 LAB — URINE CULTURE

## 2017-06-15 LAB — BASIC METABOLIC PANEL
ANION GAP: 9 (ref 5–15)
BUN: <5 mg/dL — ABNORMAL LOW (ref 6–20)
CHLORIDE: 104 mmol/L (ref 101–111)
CO2: 20 mmol/L — AB (ref 22–32)
Calcium: 8.4 mg/dL — ABNORMAL LOW (ref 8.9–10.3)
Creatinine, Ser: 0.74 mg/dL (ref 0.44–1.00)
GFR calc Af Amer: >60 mL/min (ref >60)
GFR calc non Af Amer: >60 mL/min (ref >60)
GLUCOSE: 315 mg/dL — AB (ref 65–99)
POTASSIUM: 3.3 mmol/L — AB (ref 3.5–5.1)
Sodium: 133 mmol/L — ABNORMAL LOW (ref 135–145)

## 2017-06-15 LAB — GLUCOSE, CAPILLARY
Glucose-Capillary: 302 mg/dL — ABNORMAL HIGH (ref 65–99)
Glucose-Capillary: 152 mg/dL — ABNORMAL HIGH (ref 65–99)

## 2017-06-15 MED ORDER — INSULIN GLARGINE 100 UNIT/ML ~~LOC~~ SOLN
26.0000 [IU] | Freq: Every day | SUBCUTANEOUS | Status: DC
  Filled 2017-06-15: qty 0.26

## 2017-06-15 MED ORDER — INSULIN GLARGINE 100 UNIT/ML SOLOSTAR PEN: PEN_INJECTOR | SUBCUTANEOUS | 1 refills | Status: DC

## 2017-06-15 MED ORDER — ONDANSETRON 4 MG PO TBDP: 4.0000 mg | ORAL_TABLET | Freq: Four times a day (QID) | ORAL | 0 refills | Status: DC | PRN

## 2017-06-15 MED ORDER — GLUCOSE BLOOD VI STRP: ORAL_STRIP | 12 refills | Status: DC

## 2017-06-15 MED ORDER — ACCU-CHEK FASTCLIX LANCET KIT: 1.0000 | PACK | Freq: Once | 0 refills | Status: AC

## 2017-06-15 MED ORDER — ACCU-CHEK FASTCLIX LANCETS MISC: 3 refills | Status: DC

## 2017-06-15 MED ORDER — POTASSIUM CHLORIDE CRYS ER 20 MEQ PO TBCR
40.0000 meq | EXTENDED_RELEASE_TABLET | Freq: Once | ORAL | Status: AC
  Administered 2017-06-15: 40 meq via ORAL
  Filled 2017-06-15: qty 2

## 2017-06-15 MED ORDER — INSULIN PEN NEEDLE 31G X 5 MM MISC: 2 refills | Status: DC

## 2017-06-15 MED ORDER — INSULIN ASPART 100 UNIT/ML FLEXPEN: PEN_INJECTOR | SUBCUTANEOUS | 1 refills | Status: DC

## 2017-06-15 MED ORDER — ACCU-CHEK AVIVA PLUS W/DEVICE KIT: 1.0000 | PACK | Freq: Once | 0 refills | Status: AC

## 2017-06-15 MED ORDER — INSULIN GLARGINE 100 UNIT/ML ~~LOC~~ SOLN
26.0000 [IU] | Freq: Every day | SUBCUTANEOUS | Status: DC
  Administered 2017-06-15: 26 [IU] via SUBCUTANEOUS
  Filled 2017-06-15: qty 0.26

## 2017-06-15 MED ORDER — INSULIN ASPART 100 UNIT/ML ~~LOC~~ SOLN
0.0000 [IU] | Freq: Three times a day (TID) | SUBCUTANEOUS | Status: DC
  Administered 2017-06-15: 11 [IU] via SUBCUTANEOUS
  Administered 2017-06-15: 3 [IU] via SUBCUTANEOUS

## 2017-06-15 NOTE — Progress Notes (Signed)
Patient's glucose level at 315, text MD and changed order for Lantus to be given early 26 units.  Waiting on pharmacy confirmation.

## 2017-06-15 NOTE — Progress Notes (Signed)
Patient has not complained of nausea at this time, has been resting all night w/o any problems.

## 2017-06-15 NOTE — Discharge Instructions (Signed)
Ms. Uzelac,  Please resume lantus 26 units daily. Continue 10 units novolog with meals. However, if you are unable to eat due to nausea, hold mealtime insulin. I have ordered the anti-nausea medicine zofran to take in case you have continued trouble with nausea.   Please pick up your insulin and meter with supplies from your pharmacy. I recommend checking your blood sugar at least 3 x a day.   Drink plenty of water as you continue to recover.   Thank you for letting us take part in your care.

## 2017-06-15 NOTE — Progress Notes (Signed)
Family Medicine Teaching Service Daily Progress Note Intern Pager: [phone]  Patient name: Sabrina Sutton Medical record number: [id number] Date of birth: 1995-11-06 Age: 21 y.o. Gender: female  Primary Care Provider: Smiley Houseman, MD Consultants:  Code Status:   Pt Overview and Major Events to Date:   Assessment and Plan: Sabrina Sutton is a 21 y.o. female presenting with DKA. PMH is significant T1DM, hx of DKA, and hx of pyelonephritis   Moderate/Severe DKA: DKA likely induced by lack of patient taking her Lantus the 2 days prior to admission. Glucose 315 this am and no anion gap; tolerated PO yesterday and HR improved. UA with hgb and leuks but no nitrites; patient does not endorse abdominal pain or dysuria.  - Transferred to med-surg floor yesterday afternoon, 11/22 - resumed home lantus 26 U this am - moderate SSI - urine cx in process - vitals per floor  - d/c IVFs  T1DM: Last A1C 13.2 (2017) Per patient she is currently on a regimen of 26 units of Lantus daily, and NovoLog 10 units 3 times daily.  She sometimes will give an extra 10 units of NovoLog if she is having snacks.  Likely poor social circumstances patient lives with her sister and denies much motivation to come into clinic. - worsening A1c at >15.5 -Diabetes teaching --> Coordinator reviewed ways to avoid running out of insulin -Social work consult  AKI: Resolved. SCr 0.74 compared to 1.88 on admission.   Hypokalemia: K 3.3 this a.m. - ordered 40 mEq kdur  FEN/GI: carb-modified diet Prophylaxis: Lovenox   Disposition: anticipate discharge home today with close follow-up  Subjective:  Patient says she has been eating well since yesterday. She feels better and would like to go home. She knows to drink plenty of water. She also requests glucometer, as she no longer has one at home.   Objective: Temp:  [97.9 F (36.6 C)-98.9 F (37.2 C)] 97.9 F (36.6 C) (11/23 0532) Pulse Rate:  [85-113]  85 (11/23 0532) Resp:  [16-18] 17 (11/23 0027) BP: (102-119)/(66-79) 102/72 (11/23 0532) SpO2:  [97 %-100 %] 100 % (11/23 0532) Weight:  [129 lb 3.2 oz (58.6 kg)] 129 lb 3.2 oz (58.6 kg) (11/23 0532) Physical Exam: General: Thin young woman, resting in bed; AOx3 HENT: MMM Cardiovascular: RRR, no m/r/g; cap refill < 3 sec Respiratory: CTAB, no increased WOB Abdomen: +BS, soft, NT, ND Extremities: warm, moves all spontaneously  Laboratory: Recent Labs  Lab 06/13/17 0744 06/15/17 0356  WBC 13.0* 7.0  HGB 16.3* 13.1  HCT 46.0 38.4  PLT 331 211   Recent Labs  Lab 06/14/17 0955 06/14/17 1438 06/15/17 0356  NA 132* 133* 133*  K 3.5 3.5 3.3*  CL 104 108 104  CO2 9* 15* 20*  BUN 6 <5* <5*  CREATININE 1.18* 0.79 0.74  CALCIUM 8.7* 8.6* 8.4*  GLUCOSE 271* 200* 315*    Imaging/Diagnostic Tests: Dg Chest 2 View  Result Date: 06/13/2017 CLINICAL DATA:  Productive cough for 2 days EXAM: CHEST  2 VIEW COMPARISON:  04/17/2016 FINDINGS: The heart size and mediastinal contours are within normal limits. Both lungs are clear. The visualized skeletal structures are unremarkable. IMPRESSION: No active cardiopulmonary disease. Electronically Signed   By: Inez Catalina M.D.   On: 06/13/2017 08:41    Rogue Bussing, MD 06/15/2017, 6:27 AM PGY-3, [place] Intern pager: [phone], text pages welcome

## 2017-06-18 ENCOUNTER — Ambulatory Visit: Payer: Medicaid Other | Admitting: Internal Medicine

## 2017-06-26 ENCOUNTER — Ambulatory Visit: Payer: Medicaid Other | Admitting: Internal Medicine

## 2017-08-25 IMAGING — CR DG CHEST 1V PORT
1 series · 1 of 1 positions shown · non-contrast
Comparison: None.

CLINICAL DATA: Acute onset of fever.  Initial encounter.

EXAM:
PORTABLE CHEST 1 VIEW

[AP]
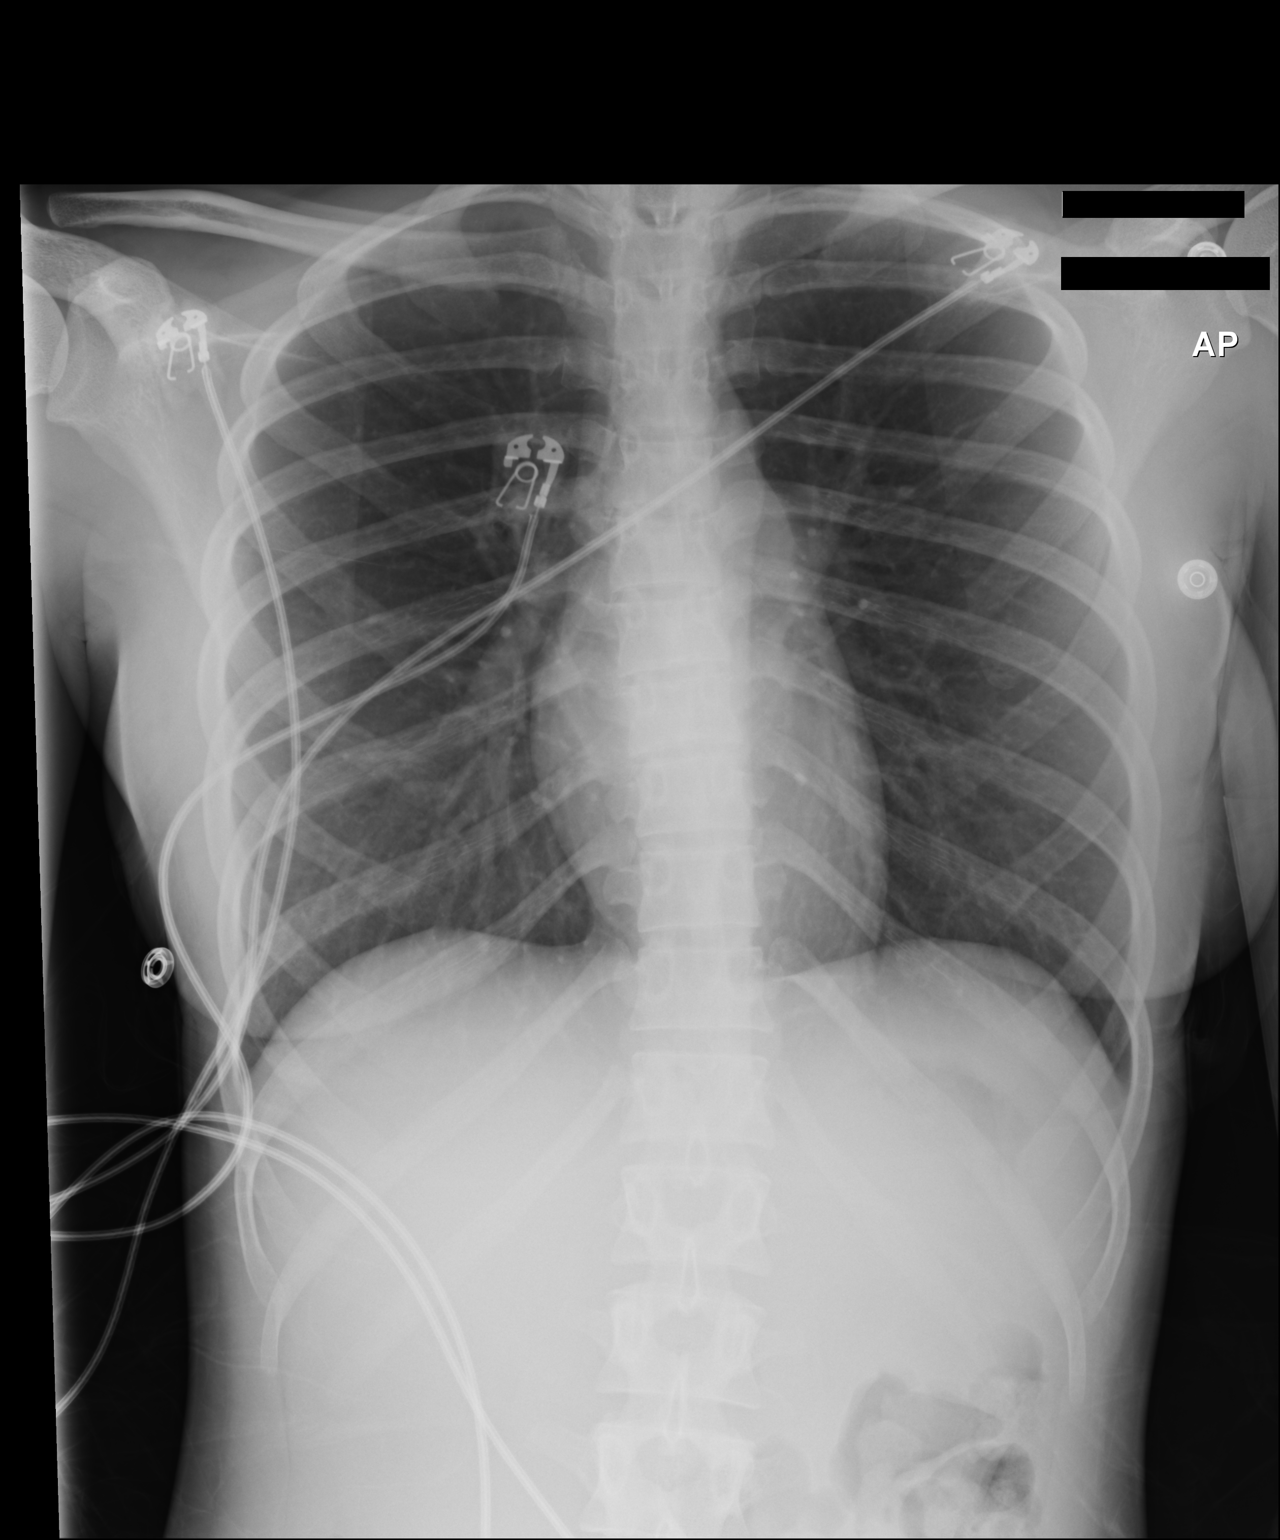

[1 of 1 positions shown; findings below may reference images not displayed]

FINDINGS: The lungs are well-aerated and clear. There is no evidence of focal
opacification, pleural effusion or pneumothorax.

The cardiomediastinal silhouette is within normal limits. No acute
osseous abnormalities are seen.
IMPRESSION: No acute cardiopulmonary process seen.

## 2017-10-23 ENCOUNTER — Encounter (HOSPITAL_COMMUNITY): Payer: Self-pay

## 2017-10-23 ENCOUNTER — Inpatient Hospital Stay (HOSPITAL_COMMUNITY)
Admission: EM | Admit: 2017-10-23 | Discharge: 2017-10-24 | DRG: 638 | Disposition: A | Discharge status: Home / Self Care | Payer: Medicaid Other | Attending: Family Medicine | Admitting: Family Medicine

## 2017-10-23 ENCOUNTER — Other Ambulatory Visit: Payer: Self-pay

## 2017-10-23 DIAGNOSIS — Z823 Family history of stroke: Secondary | ICD-10-CM | POA: Diagnosis not present

## 2017-10-23 DIAGNOSIS — N179 Acute kidney failure, unspecified: Secondary | ICD-10-CM | POA: Diagnosis present

## 2017-10-23 DIAGNOSIS — E860 Dehydration: Secondary | ICD-10-CM | POA: Diagnosis present

## 2017-10-23 DIAGNOSIS — E1010 Type 1 diabetes mellitus with ketoacidosis without coma: Secondary | ICD-10-CM | POA: Diagnosis not present

## 2017-10-23 DIAGNOSIS — B951 Streptococcus, group B, as the cause of diseases classified elsewhere: Secondary | ICD-10-CM | POA: Diagnosis present

## 2017-10-23 DIAGNOSIS — E1065 Type 1 diabetes mellitus with hyperglycemia: Secondary | ICD-10-CM | POA: Diagnosis not present

## 2017-10-23 DIAGNOSIS — E109 Type 1 diabetes mellitus without complications: Secondary | ICD-10-CM

## 2017-10-23 DIAGNOSIS — E108 Type 1 diabetes mellitus with unspecified complications: Secondary | ICD-10-CM | POA: Diagnosis present

## 2017-10-23 DIAGNOSIS — Z8249 Family history of ischemic heart disease and other diseases of the circulatory system: Secondary | ICD-10-CM

## 2017-10-23 DIAGNOSIS — Z833 Family history of diabetes mellitus: Secondary | ICD-10-CM

## 2017-10-23 DIAGNOSIS — Z794 Long term (current) use of insulin: Secondary | ICD-10-CM | POA: Diagnosis not present

## 2017-10-23 DIAGNOSIS — R739 Hyperglycemia, unspecified: Secondary | ICD-10-CM | POA: Diagnosis not present

## 2017-10-23 DIAGNOSIS — E861 Hypovolemia: Secondary | ICD-10-CM | POA: Diagnosis present

## 2017-10-23 LAB — BASIC METABOLIC PANEL
ANION GAP: 15 (ref 5–15)
Anion gap: 11 (ref 5–15)
Anion gap: 12 (ref 5–15)
Anion gap: 14 (ref 5–15)
BUN: 15 mg/dL (ref 6–20)
BUN: 12 mg/dL (ref 6–20)
BUN: 10 mg/dL (ref 6–20)
BUN: 8 mg/dL (ref 6–20)
CALCIUM: 8.5 mg/dL — AB (ref 8.9–10.3)
CALCIUM: 8.4 mg/dL — AB (ref 8.9–10.3)
CALCIUM: 9 mg/dL (ref 8.9–10.3)
CALCIUM: 9.1 mg/dL (ref 8.9–10.3)
CO2: 12 mmol/L — ABNORMAL LOW (ref 22–32)
CO2: 15 mmol/L — ABNORMAL LOW (ref 22–32)
CO2: 17 mmol/L — ABNORMAL LOW (ref 22–32)
CO2: 17 mmol/L — ABNORMAL LOW (ref 22–32)
CREATININE: 0.78 mg/dL (ref 0.44–1.00)
Chloride: 106 mmol/L (ref 101–111)
Chloride: 107 mmol/L (ref 101–111)
Chloride: 104 mmol/L (ref 101–111)
Chloride: 101 mmol/L (ref 101–111)
Creatinine, Ser: 1 mg/dL (ref 0.44–1.00)
Creatinine, Ser: 0.68 mg/dL (ref 0.44–1.00)
Creatinine, Ser: 0.84 mg/dL (ref 0.44–1.00)
GFR calc Af Amer: >60 mL/min (ref >60)
GFR calc Af Amer: >60 mL/min (ref >60)
GFR calc Af Amer: >60 mL/min (ref >60)
GFR calc Af Amer: >60 mL/min (ref >60)
GLUCOSE: 164 mg/dL — AB (ref 65–99)
GLUCOSE: 173 mg/dL — AB (ref 65–99)
GLUCOSE: 149 mg/dL — AB (ref 65–99)
GLUCOSE: 293 mg/dL — AB (ref 65–99)
Potassium: 4 mmol/L (ref 3.5–5.1)
Potassium: 3.7 mmol/L (ref 3.5–5.1)
Potassium: 3.6 mmol/L (ref 3.5–5.1)
Potassium: 3.8 mmol/L (ref 3.5–5.1)
Sodium: 133 mmol/L — ABNORMAL LOW (ref 135–145)
Sodium: 133 mmol/L — ABNORMAL LOW (ref 135–145)
Sodium: 133 mmol/L — ABNORMAL LOW (ref 135–145)
Sodium: 132 mmol/L — ABNORMAL LOW (ref 135–145)

## 2017-10-23 LAB — CBC WITH DIFFERENTIAL/PLATELET
BASOS ABS: 0.1 10*3/uL (ref 0.0–0.1)
Basophils Relative: 0 %
Eosinophils Absolute: 0 10*3/uL (ref 0.0–0.7)
Eosinophils Relative: 0 %
HEMATOCRIT: 49.6 % — AB (ref 36.0–46.0)
Hemoglobin: 16.9 g/dL — ABNORMAL HIGH (ref 12.0–15.0)
LYMPHS ABS: 5.1 10*3/uL — AB (ref 0.7–4.0)
LYMPHS PCT: 21 %
MCH: 30.3 pg (ref 26.0–34.0)
MCHC: 34.1 g/dL (ref 30.0–36.0)
MCV: 89 fL (ref 78.0–100.0)
MONO ABS: 1.5 10*3/uL — AB (ref 0.1–1.0)
MONOS PCT: 6 %
Neutro Abs: 17.3 10*3/uL — ABNORMAL HIGH (ref 1.7–7.7)
Neutrophils Relative %: 73 %
Platelets: 434 10*3/uL — ABNORMAL HIGH (ref 150–400)
RBC: 5.57 MIL/uL — ABNORMAL HIGH (ref 3.87–5.11)
RDW: 13.1 % (ref 11.5–15.5)
WBC: 24 10*3/uL — ABNORMAL HIGH (ref 4.0–10.5)

## 2017-10-23 LAB — CBG MONITORING, ED
GLUCOSE-CAPILLARY: 220 mg/dL — AB (ref 65–99)
GLUCOSE-CAPILLARY: 192 mg/dL — AB (ref 65–99)
GLUCOSE-CAPILLARY: 172 mg/dL — AB (ref 65–99)
GLUCOSE-CAPILLARY: 272 mg/dL — AB (ref 65–99)
Glucose-Capillary: 366 mg/dL — ABNORMAL HIGH (ref 65–99)
Glucose-Capillary: 178 mg/dL — ABNORMAL HIGH (ref 65–99)
Glucose-Capillary: 165 mg/dL — ABNORMAL HIGH (ref 65–99)
Glucose-Capillary: 181 mg/dL — ABNORMAL HIGH (ref 65–99)
Glucose-Capillary: 164 mg/dL — ABNORMAL HIGH (ref 65–99)

## 2017-10-23 LAB — I-STAT VENOUS BLOOD GAS, ED
Acid-base deficit: 22 mmol/L — ABNORMAL HIGH (ref 0.0–2.0)
Bicarbonate: 8.2 mmol/L — ABNORMAL LOW (ref 20.0–28.0)
O2 SAT: 37 %
PCO2 VEN: 31.9 mmHg — AB (ref 44.0–60.0)
TCO2: 9 mmol/L — AB (ref 22–32)
pH, Ven: 7.016 — CL (ref 7.250–7.430)
pO2, Ven: 32 mmHg (ref 32.0–45.0)

## 2017-10-23 LAB — HEMOGLOBIN A1C
HEMOGLOBIN A1C: 17.4 % — AB (ref 4.8–5.6)
Mean Plasma Glucose: 452.68 mg/dL

## 2017-10-23 LAB — GLUCOSE, CAPILLARY
GLUCOSE-CAPILLARY: 136 mg/dL — AB (ref 65–99)
GLUCOSE-CAPILLARY: 329 mg/dL — AB (ref 65–99)
Glucose-Capillary: 174 mg/dL — ABNORMAL HIGH (ref 65–99)
Glucose-Capillary: 155 mg/dL — ABNORMAL HIGH (ref 65–99)
Glucose-Capillary: 298 mg/dL — ABNORMAL HIGH (ref 65–99)

## 2017-10-23 LAB — COMPREHENSIVE METABOLIC PANEL
ALBUMIN: 4.7 g/dL (ref 3.5–5.0)
ALT: 30 U/L (ref 14–54)
AST: 44 U/L — ABNORMAL HIGH (ref 15–41)
Alkaline Phosphatase: 180 U/L — ABNORMAL HIGH (ref 38–126)
Anion gap: 32 — ABNORMAL HIGH (ref 5–15)
BUN: 22 mg/dL — AB (ref 6–20)
CHLORIDE: 89 mmol/L — AB (ref 101–111)
CO2: 8 mmol/L — AB (ref 22–32)
Calcium: 9.8 mg/dL (ref 8.9–10.3)
Creatinine, Ser: 1.64 mg/dL — ABNORMAL HIGH (ref 0.44–1.00)
GFR calc Af Amer: 52 mL/min — ABNORMAL LOW (ref >60)
GFR calc non Af Amer: 45 mL/min — ABNORMAL LOW (ref >60)
GLUCOSE: 693 mg/dL — AB (ref 65–99)
POTASSIUM: 4.8 mmol/L (ref 3.5–5.1)
Sodium: 129 mmol/L — ABNORMAL LOW (ref 135–145)
Total Bilirubin: 1.6 mg/dL — ABNORMAL HIGH (ref 0.3–1.2)
Total Protein: 9 g/dL — ABNORMAL HIGH (ref 6.5–8.1)

## 2017-10-23 LAB — I-STAT BETA HCG BLOOD, ED (MC, WL, AP ONLY)

## 2017-10-23 LAB — LIPASE, BLOOD: Lipase: 22 U/L (ref 11–51)

## 2017-10-23 MED ORDER — INSULIN ASPART 100 UNIT/ML ~~LOC~~ SOLN
0.0000 [IU] | Freq: Three times a day (TID) | SUBCUTANEOUS | Status: DC
  Administered 2017-10-24: 7 [IU] via SUBCUTANEOUS

## 2017-10-23 MED ORDER — SODIUM CHLORIDE 0.9 % IV SOLN
INTRAVENOUS | Status: DC
  Administered 2017-10-23: 2.6 [IU]/h via INTRAVENOUS
  Administered 2017-10-23: 2.1 [IU]/h via INTRAVENOUS
  Administered 2017-10-23: 3.1 [IU]/h via INTRAVENOUS
  Filled 2017-10-23: qty 1

## 2017-10-23 MED ORDER — ONDANSETRON HCL 4 MG/2ML IJ SOLN
4.0000 mg | Freq: Once | INTRAMUSCULAR | Status: AC
  Administered 2017-10-23: 4 mg via INTRAVENOUS
  Filled 2017-10-23: qty 2

## 2017-10-23 MED ORDER — POTASSIUM CHLORIDE 10 MEQ/100ML IV SOLN
10.0000 meq | INTRAVENOUS | Status: DC
  Administered 2017-10-23 (×3): 10 meq via INTRAVENOUS
  Filled 2017-10-23 (×3): qty 100

## 2017-10-23 MED ORDER — DEXTROSE-NACL 5-0.45 % IV SOLN
INTRAVENOUS | Status: DC
  Administered 2017-10-23: 10:00:00 via INTRAVENOUS

## 2017-10-23 MED ORDER — INSULIN ASPART 100 UNIT/ML ~~LOC~~ SOLN
0.0000 [IU] | SUBCUTANEOUS | Status: DC
  Administered 2017-10-23: 7 [IU] via SUBCUTANEOUS
  Administered 2017-10-24: 5 [IU] via SUBCUTANEOUS

## 2017-10-23 MED ORDER — ACETAMINOPHEN 650 MG RE SUPP: 650.0000 mg | Freq: Four times a day (QID) | RECTAL | Status: DC | PRN

## 2017-10-23 MED ORDER — ENOXAPARIN SODIUM 30 MG/0.3ML ~~LOC~~ SOLN
30.0000 mg | SUBCUTANEOUS | Status: DC
  Administered 2017-10-23: 30 mg via SUBCUTANEOUS
  Filled 2017-10-23: qty 0.3

## 2017-10-23 MED ORDER — ACETAMINOPHEN 325 MG PO TABS: 650.0000 mg | ORAL_TABLET | Freq: Four times a day (QID) | ORAL | Status: DC | PRN

## 2017-10-23 MED ORDER — SODIUM CHLORIDE 0.9 % IV SOLN
INTRAVENOUS | Status: DC
  Administered 2017-10-23: 07:00:00 via INTRAVENOUS

## 2017-10-23 MED ORDER — DEXTROSE 50 % IV SOLN: 25.0000 mL | INTRAVENOUS | Status: DC | PRN

## 2017-10-23 MED ORDER — ONDANSETRON HCL 4 MG/2ML IJ SOLN: 4.0000 mg | Freq: Four times a day (QID) | INTRAMUSCULAR | Status: DC | PRN

## 2017-10-23 MED ORDER — INSULIN GLARGINE 100 UNIT/ML ~~LOC~~ SOLN
26.0000 [IU] | Freq: Every day | SUBCUTANEOUS | Status: DC
  Administered 2017-10-23 – 2017-10-24 (×2): 26 [IU] via SUBCUTANEOUS
  Filled 2017-10-23 (×2): qty 0.26

## 2017-10-23 MED ORDER — SODIUM CHLORIDE 0.9 % IV BOLUS
1000.0000 mL | Freq: Once | INTRAVENOUS | Status: AC
  Administered 2017-10-23: 1000 mL via INTRAVENOUS

## 2017-10-23 MED ORDER — KCL IN DEXTROSE-NACL 20-5-0.45 MEQ/L-%-% IV SOLN
INTRAVENOUS | Status: DC
  Administered 2017-10-23: 15:00:00 via INTRAVENOUS
  Filled 2017-10-23: qty 1000

## 2017-10-23 MED ORDER — SODIUM CHLORIDE 0.9 % IV SOLN
INTRAVENOUS | Status: DC
  Administered 2017-10-23 (×2): via INTRAVENOUS

## 2017-10-23 MED ORDER — ONDANSETRON HCL 4 MG PO TABS: 4.0000 mg | ORAL_TABLET | Freq: Four times a day (QID) | ORAL | Status: DC | PRN

## 2017-10-23 MED ORDER — INSULIN REGULAR BOLUS VIA INFUSION
0.0000 [IU] | Freq: Three times a day (TID) | INTRAVENOUS | Status: DC
  Administered 2017-10-23: 3.1 [IU] via INTRAVENOUS
  Filled 2017-10-23: qty 10

## 2017-10-23 MED ORDER — MORPHINE SULFATE (PF) 4 MG/ML IV SOLN
4.0000 mg | Freq: Once | INTRAVENOUS | Status: AC
  Administered 2017-10-23: 4 mg via INTRAVENOUS
  Filled 2017-10-23: qty 1

## 2017-10-23 NOTE — ED Provider Notes (Signed)
[facility] EMERGENCY DEPARTMENT Provider Note   CSN: [id number] Arrival date & time: 10/23/17  0409     History   Chief Complaint Chief Complaint  Patient presents with  . Hyperglycemia  . Flank Pain    HPI Sabrina Sutton is a 22 y.o. female.  The history is provided by the patient.  Hyperglycemia  Flank Pain   She has history of diabetes mellitus with ketoacidosis, pyelonephritis and comes in complaining of bilateral flank pain, nausea, vomiting.  Flank pain is severe and she rates it at 8/10.  It started about 10 PM.  Vomiting started at about 11 PM.  She admits to missing her morning dose of insulin because she left the house and forgot to take it.  Apparently, her mother gave her insulin tonight when she started throwing up.  She denies fever or chills.  There is been no constipation or diarrhea.  She denies any sick contacts.  Past Medical History:  Diagnosis Date  . Diabetes mellitus without complication ([place]) 4/76/54   + GAD Ab  . DKA (diabetic ketoacidoses) ([place]) 09/20/2015  . DKA (diabetic ketoacidoses) ([place]) 06/13/2017  . Hyperglycemia due to type 1 diabetes mellitus ([place])   . Hypoglycemia due to type 1 diabetes mellitus ([place]) 11/19/2015  . Hypokalemia   . Pyelonephritis 04/17/2016    Patient Active Problem List   Diagnosis Date Noted  . DKA, type 1 ([place]) 06/13/2017  . Acute kidney injury ([place]) 06/13/2017  . 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 Frequent No-show for appointment 11/03/2016  . Type 1 diabetes mellitus without complication ([place]) 65/09/5463  . Dehydration     Past Surgical History:  Procedure Laterality Date  . NO PAST SURGERIES       OB History   None      Home Medications    Prior to Admission medications   Medication Sig Start Date End Date Taking? Authorizing Provider  ACCU-CHEK FASTCLIX LANCETS MISC Check sugar 10 x daily 06/15/17   Rogue Bussing, MD  acetone, urine, test strip Check ketones per protocol  09/23/15   Levon Hedger, MD  Alcohol Swabs (ALCOHOL PADS) 70 % PADS Use to wipe skin prior to insulin injections twice daily 09/23/15   Levon Hedger, MD  glucagon 1 MG injection Inject 1mg  IM if unconscious, seizing, or unable to eat to correct low blood sugar 09/23/15   Levon Hedger, MD  glucose blood (ACCU-CHEK AVIVA PLUS) test strip Use as instructed 06/15/17   Rogue Bussing, MD  insulin aspart (NOVOLOG FLEXPEN) 100 UNIT/ML FlexPen INJECT 10 UNITS SUBCUTANEOUSLY THREE TIMES DAILY BEFORE MEALS 06/15/17   Rogue Bussing, MD  Insulin Glargine (LANTUS SOLOSTAR) 100 UNIT/ML Solostar Pen INJECT 26 UNITS UNDER THE SKIN DAILY OR AS DIRECTED 06/15/17   Rogue Bussing, MD  Insulin Pen Needle (B-D UF III MINI PEN NEEDLES) 31G X 5 MM MISC CHECK BLOOD SUGAR IN THE MORNING BEFORE EATING, BEFORE EACH MEAL, AND AS NEEDED 06/15/17   Rogue Bussing, MD  ondansetron (ZOFRAN-ODT) 4 MG disintegrating tablet Take 1 tablet (4 mg total) by mouth every 6 (six) hours as needed for nausea or vomiting. 06/15/17   Rogue Bussing, MD    Family History Family History  Problem Relation Age of Onset  . Diabetes Maternal Grandmother   . Heart disease Maternal Grandmother        Deceased from MI at age 85  . Hypertension Maternal Grandmother   . Hypercholesterolemia Mother   .  Seizures Mother   . Kidney Stones Mother   . Hyperlipidemia Mother   . Stroke Maternal Grandfather        Deceased from stroke at age 75  . Hypertension Paternal Grandmother   . Healthy Father     Social History Social History   Tobacco Use  . Smoking status: Passive Smoke Exposure - Never Smoker  . Smokeless tobacco: Never Used  Substance Use Topics  . Alcohol use: Yes    Comment: Last drank 1 month ago per MD note  . Drug use: Yes    Types: Marijuana    Comment: Last used 1  month ago per MD note     Allergies   Patient has no known  allergies.   Review of Systems Review of Systems  Genitourinary: Positive for flank pain.  All other systems reviewed and are negative.    Physical Exam Updated Vital Signs BP 107/86   Pulse (!) 141   Temp 98.4 F (36.9 C) (Oral)   Resp (!) 43   Ht 5\' 7"  (1.702 m)   Wt 59 kg (130 lb)   LMP 09/24/2017 (Approximate)   SpO2 98%   BMI 20.36 kg/m   Physical Exam  Nursing note and vitals reviewed.  22 year old female, appears uncomfortable and with clue small respirations, but is in no acute distress. Vital signs are significant for tachycardia and tachypnea. Oxygen saturation is 98%, which is normal. Head is normocephalic and atraumatic. PERRLA, EOMI. Oropharynx is clear. Neck is nontender and supple without adenopathy or JVD. Back is nontender and there is no CVA tenderness. Lungs are clear without rales, wheezes, or rhonchi. Chest is nontender. Heart has regular rate and rhythm without murmur. Abdomen is soft, flat, nontender without masses or hepatosplenomegaly and peristalsis is hypoactive. Extremities have no cyanosis or edema, full range of motion is present. Skin is warm and dry without rash. Neurologic: Mental status is normal, cranial nerves are intact, there are no motor or sensory deficits.  ED Treatments / Results  Labs (all labs ordered are listed, but only abnormal results are displayed) Labs Reviewed  COMPREHENSIVE METABOLIC PANEL - Abnormal; Notable for the following components:      Result Value   Sodium 129 (*)    Chloride 89 (*)    CO2 8 (*)    Glucose, Bld 693 (*)    BUN 22 (*)    Creatinine, Ser 1.64 (*)    Total Protein 9.0 (*)    AST 44 (*)    Alkaline Phosphatase 180 (*)    Total Bilirubin 1.6 (*)    GFR calc non Af Amer 45 (*)    GFR calc Af Amer 52 (*)    Anion gap 32 (*)    All other components within normal limits  CBC WITH DIFFERENTIAL/PLATELET - Abnormal; Notable for the following components:   WBC 24.0 (*)    RBC 5.57 (*)     Hemoglobin 16.9 (*)    HCT 49.6 (*)    Platelets 434 (*)    Neutro Abs 17.3 (*)    Lymphs Abs 5.1 (*)    Monocytes Absolute 1.5 (*)    All other components within normal limits  CBG MONITORING, ED - Abnormal; Notable for the following components:   Glucose-Capillary >600 (*)    All other components within normal limits  I-STAT VENOUS BLOOD GAS, ED - Abnormal; Notable for the following components:   pH, Ven 7.016 (*)    pCO2, Ven  31.9 (*)    Bicarbonate 8.2 (*)    TCO2 9 (*)    Acid-base deficit 22.0 (*)    All other components within normal limits  LIPASE, BLOOD  URINALYSIS, ROUTINE W REFLEX MICROSCOPIC  CBG MONITORING, ED  I-STAT BETA HCG BLOOD, ED ([facility], [facility], [facility] ONLY)    EKG EKG Interpretation  Date/Time:  Tuesday October 23 2017 04:15:45 EDT Ventricular Rate:  144 PR Interval:    QRS Duration: 166 QT Interval:  316 QTC Calculation: 491 R Axis:   78 Text Interpretation:  Sinus tachycardia Consider right atrial enlargement Nonspecific intraventricular conduction delay Minimal ST depression, lateral leads Artifact in lead(s) I II III aVR aVL aVF V3 V4 V6 When compared with ECG of 06/13/2017, No significant change was found Confirmed by Delora Fuel ([id number]) on 10/23/2017 4:28:41 AM  Procedures Procedures  CRITICAL CARE Performed by: Delora Fuel Total critical care time: 100 minutes Critical care time was exclusive of separately billable procedures and treating other patients. Critical care was necessary to treat or prevent imminent or life-threatening deterioration. Critical care was time spent personally by me on the following activities: development of treatment plan with patient and/or surrogate as well as nursing, discussions with consultants, evaluation of patient's response to treatment, examination of patient, obtaining history from patient or surrogate, ordering and performing treatments and interventions, ordering and review of laboratory studies, ordering and review of  radiographic studies, pulse oximetry and re-evaluation of patient's condition.  Medications Ordered in ED Medications - No data to display   Initial Impression / Assessment and Plan / ED Course  I have reviewed the triage vital signs and the nursing notes.  Pertinent labs & imaging results that were available during my care of the patient were reviewed by me and considered in my medical decision making (see chart for details).  Flank pain with vomiting and hyperglycemia and associated clue small respirations strongly suggestive of diabetic ketoacidosis.  Screening labs are obtained and she is given IV fluids and ondansetron.  Old records are reviewed showing prior hospital admissions for ketoacidosis, and also for pyelonephritis.  Venous blood gas confirms metabolic acidosis.  Metabolic panel shows glucose 693, CO2 of 8 with anion gap of 32 consistent with ketoacidosis.  Creatinine is elevated to 1.64.  Last creatinine on record was 0.745 months ago.  Potassium is normal, so she started on insulin infusion.  Case is discussed with Dr. Reesa Chew of family practice service who agrees to admit the patient.  Final Clinical Impressions(s) / ED Diagnoses   Final diagnoses:  Diabetic ketoacidosis without coma associated with type 1 diabetes mellitus ([place])  Acute kidney injury (nontraumatic) [facility])    ED Discharge Orders    None       Delora Fuel, MD 94/49/67 [phone]

## 2017-10-23 NOTE — ED Triage Notes (Signed)
Per [facility], pt has had bilateral flank pain 8/10 since 10 pm on 10/22/17. Pt has type 1 DM and had not taken insulin since yesterday. Pt reports throwing up so mother gave 30 units of novolog insulin without checking her sugar. Pt has had 3 episodes of vomiting. Pt reports mother gave insulin around 0200. EMS CBG read 216.

## 2017-10-23 NOTE — ED Notes (Signed)
ED Provider at bedside. 

## 2017-10-23 NOTE — Progress Notes (Signed)
Inpatient Diabetes Program   AACE/ADA: New Consensus Statement on Inpatient Glycemic Control (2015)  Target Ranges:  Prepandial:   less than 140 mg/dL      Peak postprandial:   less than 180 mg/dL (1-2 hours)      Critically ill patients:  140 - 180 mg/dL   Lab Results  Component Value Date   GLUCAP 165 (H) 10/23/2017   HGBA1C 17.4 (H) 10/23/2017   Review of Glycemic Control  Diabetes history: DM 1 Outpatient Diabetes medications: Lantus 26 units, Novolog 10 units tid with meals Current orders for Inpatient glycemic control: IV insulin gtt  Inpatient Diabetes Program Recommendations:    Spoke with patient about diabetes and home regimen for diabetes control. Patient reports that she is checking her blood sugars and taking her insulin. Discussed her A1c value of 17.4%, patient admits to not taking her insulin as she should. Spoke to patient about complications and importance of taking her insulin. [place] reports needing supplies for her meter. Mom to bring in her supplies so we know the brand needed for refills. Reviewed sick day guidelines of taking insulin and checking glucose more often. Will see patient tomorrow and will follow this admission.  Thanks,  Tama Headings RN, MSN, [facility] Inpatient Diabetes Coordinator Team Pager [phone] (8a-5p)

## 2017-10-23 NOTE — ED Notes (Signed)
Meal tray delivered.

## 2017-10-23 NOTE — H&P (Signed)
[facility] Admission History and Physical Service Pager: [phone]  Patient name: Sabrina Sutton Medical record number: [id number] Date of birth: [birth year] Age: 22 y.o. Gender: female  Primary Care Provider: Smiley Houseman, MD Consultants: None Code Status: FULL  Chief Complaint: hyperglycemia  Assessment and Plan: Sabrina Sutton is a 22 y.o. female presenting with hyperglycemia and flank pain. PMH is significant for T1DM, h/o pyelonephritis and HSV-2.   Hyperglycemia 2/2 DKA On arrival to ED, pt reporting bilateral flank pain, nausea and vomiting since yesterday evening. In ED found to be hyperglycemic with CBG >600 and BMP glucose 693.  Reports missing morning dose of insulin because she forgot and her mother gave her 30 units of NovoLog insulin due to feeling ill.  Home insulin regimen is NovoLog 10 units TID before meals and Lantus 26 units daily.  Initially tachycardic to 141 with respiratory rate 43 in the ED.  She is afebrile. In setting of tachycardia with elevated white count of 24 and hgb elevated to 16.9 this is likely 2/2 severe dehydration.  Labs notable for SCr 1.64, AST 44, Na 129, K+ 4.8 and anion gap of 32.  Lipase within normal limit and urine pregnancy test negative.  EKG with sinus tachycardia and UA still pending.  Most recent A1c >15.5 in 05/2017.  Patient appears chronically poorly controlled with frequent no-show at PCP office.  I-STAT VBG exhibiting metabolic acidosis with pH 7.061, bicarb 8, and PCO2 31.9.  In the ED, she received IV fluids, insulin, morphine for pain control and Zofran for nausea.   It is possible in setting of flank pain she may have an underlying infection that precipitated DKA.  Will admit to [facility]PTS for diabetic ketoacidosis management.   -Admitting to stepdown unit, attending Dr. McDiarmid - Insulin gtt per glucose stabilizer -s/p 1L NS bolus x1 in ED - Continue IV NS at 250 ml/hr; change fluids to D5 1/2  once CBGs <250 -q1h CBGs  -Q4h BMET -Diet NPO for now -UA pending -Will add urine GC chlamydia -May consider repeat A1c -Vitals per unit routine  AKI SCr on admission elevated to 1.64, baseline appears to be 0.7-0.8 per chart.  Acute kidney injury likely secondary to severe dehydration. -Continue IV fluids  -monitor on BMET for resolution   HSV-2 Controlled.  Patient is sexually active.  Not currently on medications and patient denies active lesions.  Social Per chart review, pt is a frequent no show-er, however has had numerous visits to ED. Patient will need extensive diabetes teaching regarding her insulin therapy. -Encourage close f/u outpatient with PCP -May benefit from DM educator while inpatient; consult placed -CSW consulted to assess for barriers to follow-up  FEN/GI: NPO for now, IV NS >> followed by D5 1/2 NS Prophylaxis: lovenox   Disposition: Admit to stepdown, attending Dr. McDiarmid  History of Present Illness:  Sabrina Sutton is a 22 y.o. female presenting with nausea and vomiting that began yesterday evening.  Patient endorses forgetting to take a dose of her insulin.  Her mother gave her 30 units of Lantus without checking a CBG.  She also reports bilateral flank pain that comes and goes.  Pain is worse with deep inspiration.  Denies urinary symptoms.  No fevers, chills.  She has been eating and drinking normally.  She states she usually manages her own insulin unless she is feeling too weak at which time her mother helps her.  She has had 3-4 episodes of vomiting total.  Denies abdominal  pain, diarrhea.    Review Of Systems: Per HPI with the following additions:   Review of Systems  Constitutional: Negative for chills and fever.  HENT: Negative for congestion.   Respiratory: Positive for shortness of breath. Negative for cough.   Cardiovascular: Negative for chest pain and leg swelling.  Gastrointestinal: Positive for nausea and vomiting. Negative for  abdominal pain and diarrhea.  Genitourinary: Positive for flank pain. Negative for dysuria, frequency and urgency.  Skin: Negative for rash.   Patient Active Problem List   Diagnosis Date Noted  . Acute kidney injury ([place]) 06/13/2017  . 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 Frequent No-show for appointment 11/03/2016  . Type 1 diabetes mellitus without complication ([place]) 82/99/3716  . Hypovolemia due to dehydration   . Diabetic ketoacidosis without coma associated with type 1 diabetes mellitus ([place]) 09/20/2015   Past Medical History: Past Medical History:  Diagnosis Date  . Diabetes mellitus without complication ([place]) 9/67/89   + GAD Ab  . DKA (diabetic ketoacidoses) ([place]) 09/20/2015  . Hypokalemia   . Pyelonephritis 04/17/2016   Past Surgical History: Past Surgical History:  Procedure Laterality Date  . NO PAST SURGERIES     Social History: Social History   Tobacco Use  . Smoking status: Passive Smoke Exposure - Never Smoker  . Smokeless tobacco: Never Used  Substance Use Topics  . Alcohol use: Yes    Comment: Last drank 1 month ago per MD note  . Drug use: Yes    Types: Marijuana    Comment: Last used 1  month ago per MD note   Additional social history: Patient is a never smoker, endorses occasional marijuana and alcohol use. Please also refer to relevant sections of EMR.  Family History: Family History  Problem Relation Age of Onset  . Diabetes Maternal Grandmother   . Heart disease Maternal Grandmother        Deceased from MI at age 92  . Hypertension Maternal Grandmother   . Hypercholesterolemia Mother   . Seizures Mother   . Kidney Stones Mother   . Hyperlipidemia Mother   . Stroke Maternal Grandfather        Deceased from stroke at age 47  . Hypertension Paternal Grandmother   . Healthy Father    Allergies and Medications: No Known Allergies No current facility-administered medications on file prior to encounter.    Current Outpatient Medications on File  Prior to Encounter  Medication Sig Dispense Refill  . ACCU-CHEK FASTCLIX LANCETS MISC Check sugar 10 x daily 304 each 3  . acetone, urine, test strip Check ketones per protocol 50 each 3  . Alcohol Swabs (ALCOHOL PADS) 70 % PADS Use to wipe skin prior to insulin injections twice daily 200 each 6  . glucagon 1 MG injection Inject 1mg  IM if unconscious, seizing, or unable to eat to correct low blood sugar 2 each 2  . glucose blood (ACCU-CHEK AVIVA PLUS) test strip Use as instructed 100 each 12  . insulin aspart (NOVOLOG FLEXPEN) 100 UNIT/ML FlexPen INJECT 10 UNITS SUBCUTANEOUSLY THREE TIMES DAILY BEFORE MEALS 15 mL 1  . Insulin Glargine (LANTUS SOLOSTAR) 100 UNIT/ML Solostar Pen INJECT 26 UNITS UNDER THE SKIN DAILY OR AS DIRECTED 15 mL 1  . Insulin Pen Needle (B-D UF III MINI PEN NEEDLES) 31G X 5 MM MISC CHECK BLOOD SUGAR IN THE MORNING BEFORE EATING, BEFORE EACH MEAL, AND AS NEEDED 100 each 2  . ondansetron (ZOFRAN-ODT) 4 MG disintegrating tablet Take 1 tablet (4 mg  total) by mouth every 6 (six) hours as needed for nausea or vomiting. 20 tablet 0   Objective: BP 108/77   Pulse (!) 130   Temp 98.4 F (36.9 C) (Oral)   Resp 12   Ht 5\' 7"  (1.702 m)   Wt 130 lb (59 kg)   LMP 09/24/2017 (Approximate)   SpO2 99%   BMI 20.36 kg/m    Exam: General: Pleasant 22 year old female, NAD  Eyes: EOMI, PERRLA ENTM: Dry mucous membranes, oropharynx is clear Neck: Supple, normal range of motion Cardiovascular: Tachycardic, normal rhythm, no MRG Respiratory: CTA B, normal effort Gastrointestinal: Soft, NT ND, no CVA tenderness bilaterally, no rebound or guarding, +bs  MSK: No edema or tenderness Derm: Warm, dry, no rash Neuro: Alert, oriented x3, no focal deficits, motor strength 5/5 bilaterally in upper and lower extremities Psych: Normal mood and affect  Labs and Imaging: CBC BMET  Recent Labs  Lab 10/23/17 0434  WBC 24.0*  HGB 16.9*  HCT 49.6*  PLT 434*   Recent Labs  Lab 10/23/17 0434   NA 129*  K 4.8  CL 89*  CO2 8*  BUN 22*  CREATININE 1.64*  GLUCOSE 693*  CALCIUM 9.8     Lovenia Kim, MD 10/23/2017, 9:12 AM PGY-2, [place] Intern pager: [phone], text pages welcome

## 2017-10-23 NOTE — ED Notes (Signed)
EMERGENCY DEPARTMENT  US GUIDANCE EXAM Emergency Ultrasound:  US Guidance for Needle Guidance  INDICATIONS: Difficult vascular access Linear probe used in real-time to visualize location of needle entry through skin.   PERFORMED BY: Myself IMAGES ARCHIVED?: no LIMITATIONS: none VIEWS USED: Transverse INTERPRETATION: Needle visualized within vein   Tolerated well w/no complications.   Aaron Edelman RN  4:55 AM 10/23/17

## 2017-10-23 NOTE — ED Notes (Signed)
Provider at bedside

## 2017-10-23 NOTE — ED Notes (Signed)
Admitting team at bedside.

## 2017-10-24 LAB — BASIC METABOLIC PANEL
ANION GAP: 11 (ref 5–15)
ANION GAP: 16 — AB (ref 5–15)
Anion gap: 9 (ref 5–15)
BUN: 6 mg/dL (ref 6–20)
BUN: 8 mg/dL (ref 6–20)
BUN: 7 mg/dL (ref 6–20)
CALCIUM: 9 mg/dL (ref 8.9–10.3)
CHLORIDE: 105 mmol/L (ref 101–111)
CO2: 19 mmol/L — ABNORMAL LOW (ref 22–32)
CO2: 21 mmol/L — AB (ref 22–32)
CO2: 15 mmol/L — AB (ref 22–32)
CREATININE: 0.59 mg/dL (ref 0.44–1.00)
Calcium: 9.2 mg/dL (ref 8.9–10.3)
Calcium: 9.1 mg/dL (ref 8.9–10.3)
Chloride: 103 mmol/L (ref 101–111)
Chloride: 108 mmol/L (ref 101–111)
Creatinine, Ser: 0.76 mg/dL (ref 0.44–1.00)
Creatinine, Ser: 0.59 mg/dL (ref 0.44–1.00)
GFR calc Af Amer: >60 mL/min (ref >60)
GFR calc Af Amer: >60 mL/min (ref >60)
GFR calc Af Amer: >60 mL/min (ref >60)
GFR calc non Af Amer: >60 mL/min (ref >60)
GFR calc non Af Amer: >60 mL/min (ref >60)
GFR calc non Af Amer: >60 mL/min (ref >60)
GLUCOSE: 103 mg/dL — AB (ref 65–99)
GLUCOSE: 296 mg/dL — AB (ref 65–99)
GLUCOSE: 105 mg/dL — AB (ref 65–99)
Potassium: 3.6 mmol/L (ref 3.5–5.1)
Potassium: 4.6 mmol/L (ref 3.5–5.1)
Potassium: 3.2 mmol/L — ABNORMAL LOW (ref 3.5–5.1)
Sodium: 133 mmol/L — ABNORMAL LOW (ref 135–145)
Sodium: 136 mmol/L (ref 135–145)
Sodium: 138 mmol/L (ref 135–145)

## 2017-10-24 LAB — RAPID URINE DRUG SCREEN, HOSP PERFORMED
Amphetamines: NOT DETECTED
BENZODIAZEPINES: NOT DETECTED
Barbiturates: NOT DETECTED
COCAINE: NOT DETECTED
OPIATES: POSITIVE — AB
Tetrahydrocannabinol: POSITIVE — AB

## 2017-10-24 LAB — GLUCOSE, CAPILLARY
GLUCOSE-CAPILLARY: 258 mg/dL — AB (ref 65–99)
Glucose-Capillary: 93 mg/dL (ref 65–99)
Glucose-Capillary: 90 mg/dL (ref 65–99)
Glucose-Capillary: 318 mg/dL — ABNORMAL HIGH (ref 65–99)
Glucose-Capillary: 155 mg/dL — ABNORMAL HIGH (ref 65–99)

## 2017-10-24 LAB — CBC
HEMATOCRIT: 37.4 % (ref 36.0–46.0)
Hemoglobin: 13.1 g/dL (ref 12.0–15.0)
MCH: 29.6 pg (ref 26.0–34.0)
MCHC: 35 g/dL (ref 30.0–36.0)
MCV: 84.4 fL (ref 78.0–100.0)
Platelets: 290 10*3/uL (ref 150–400)
RBC: 4.43 MIL/uL (ref 3.87–5.11)
RDW: 13 % (ref 11.5–15.5)
WBC: 7.2 10*3/uL (ref 4.0–10.5)

## 2017-10-24 LAB — URINALYSIS, ROUTINE W REFLEX MICROSCOPIC
BACTERIA UA: NONE SEEN
Bilirubin Urine: NEGATIVE
Hgb urine dipstick: NEGATIVE
Ketones, ur: 5 mg/dL — AB
Nitrite: NEGATIVE
PROTEIN: NEGATIVE mg/dL
Specific Gravity, Urine: 1.018 (ref 1.005–1.030)
pH: 6 (ref 5.0–8.0)

## 2017-10-24 LAB — HIV ANTIBODY (ROUTINE TESTING W REFLEX): HIV Screen 4th Generation wRfx: NONREACTIVE

## 2017-10-24 LAB — MRSA PCR SCREENING: MRSA by PCR: NEGATIVE

## 2017-10-24 MED ORDER — POTASSIUM CHLORIDE CRYS ER 20 MEQ PO TBCR
40.0000 meq | EXTENDED_RELEASE_TABLET | Freq: Two times a day (BID) | ORAL | Status: DC
  Administered 2017-10-24: 40 meq via ORAL
  Filled 2017-10-24: qty 2

## 2017-10-24 MED ORDER — ENOXAPARIN SODIUM 40 MG/0.4ML ~~LOC~~ SOLN: 40.0000 mg | SUBCUTANEOUS | Status: DC

## 2017-10-24 MED ORDER — ACCU-CHEK SOFT TOUCH LANCETS MISC: 5 refills | Status: DC

## 2017-10-24 NOTE — Progress Notes (Addendum)
Inpatient Diabetes Program Recommendations  AACE/ADA: New Consensus Statement on Inpatient Glycemic Control (2015)  Target Ranges:  Prepandial:   less than 140 mg/dL      Peak postprandial:   less than 180 mg/dL (1-2 hours)      Critically ill patients:  140 - 180 mg/dL   Lab Results  Component Value Date   GLUCAP 90 10/24/2017   HGBA1C 17.4 (H) 10/23/2017   Saw patient again today. Asked patient what her plan was for discharge to better care for herself. Patient states that she will take her insulin like she is suppose. When asked how she reports setting alarms in her phone. Patient reports she can do better with her diet. I asked her what does that look like. Patient reports that she will portion out her food and not eat fried foods. Patient reports perfecting her carbohydrate counting skills in order to better match her insulin needs.   Patient reports she told her mom about her A1c. She said it scared bother her mom and herself. Reiterated complications from uncontrolled diabetes. Patient seems motivated for changes. Discussed A1c goals again.  D/C needs: Patient needs lancets for her glucose monitor at home (Accu-Chek Aviva). The ones prescribed last time had multiple lancets in each unit and patient's lancet device can only accommodate one lancet needle at a time. Try ordering the Accu-Chek soft touch lancets.  Thanks,  Tama Headings RN, MSN, BC-ADM, [facility] Inpatient Diabetes Coordinator Team Pager [phone] (8a-5p)

## 2017-10-24 NOTE — Progress Notes (Signed)
CSW received consult regarding helping counsel patient with diabetes, however, Diabetes Coordinator has been working with patient. CSW will sign off as no social work needs.  Percell Locus Ahmed Inniss LCSW [phone]

## 2017-10-24 NOTE — Discharge Instructions (Signed)
Ms. Sabrina Sutton, you were admitted for diabetic ketoacidosis.  After we controlled your sugars and helped you recover from your illness, we talked about ways to prevent this from happening in the future.  It will be very important for you to see Dr. Valentina Lucks on Monday, April 8 at 8:30AM to talk about ways to better treat your diabetes and Dr. Dallas Schimke on Wednesday, April 10 at 1:30PM to check on your health.  These appointments will be at the [facility].  You should also hear from Dr. Cindra Eves (the endocrinologist) office to set up an appointment with him.  People with type 1 diabetes need to see an endocrinologist and their primary doctor regularly to stay healthy, and this is extremely important for you to do.   I also prescribed different lancets for you since the ones you had did not work well for your meter.  Please call the [place] at [phone] if these new lancets do not work for you.  Please continue taking 26 units Lantus daily and using mealtime Novolog, changing the amount to fit what you're eating and what your sugar is.  Best of luck to you - we hope you will see [place] at the [facility] and we look forward to helping your diabetes get better controlled.

## 2017-10-24 NOTE — Progress Notes (Signed)
Pt given discharge instructions, prescriptions, and care notes. Pt strongly encouraged to keep follow-up appts and make changes as needed. Pt encouraged to keep checking blood sugars and take insulin as ordered. Pt verbalized understanding AEB no further questions or concerns at this time. IV was discontinued, no redness, pain, or swelling noted at this time. Telemetry discontinued and Centralized Telemetry was notified. Pt left the floor with staff in stable condition.

## 2017-10-24 NOTE — Progress Notes (Addendum)
Family Medicine Teaching Service Daily Progress Note Intern Pager: [phone]  Patient name: Sabrina Sutton Medical record number: [id number] Date of birth: Jul 20, 1996 Age: 22 y.o. Gender: female  Primary Care Provider: Smiley Houseman, MD Consultants: none Code Status: full  Pt Overview and Major Events to Date:  Admitted on 4/2 for DKA   Assessment and Plan:  Sabrina Sutton is a 22 y.o. female presenting with hyperglycemia and flank pain. PMH is significant for T1DM, h/o pyelonephritis and HSV-2.   DKA in setting of poorly controlled T1DM Home insulin regimen is NovoLog 10 units TID before meals and Lantus 26 units daily.  Has been managed over the past day with glucose stabilizer, and CBGs have now normalized.  Last BMP showed anion gap of 16, so we will repeat BMP.  It is possible in setting of flank pain she may have an underlying infection that precipitated DKA.  Hgb A1c 17.4.  CBGs elevated to low 300s but latest CBG was 105.  WBC down to 7.2 today. - transfer to med-surg - lantus 26 units - novolog 10 units TID with meals - carb modified diet - f/u Urinalysis, urine GC/Chlamydia, UDS   - discuss long term management with patient, will set up appointments with Dr. Valentina Lucks and Dr. Buddy Duty, endocrinologist - diabetes educator consult - order lancets at discharge - replenished potassium 40 mEq BID d/t K of 3.2  AKI SCr on admission elevated to 1.64, has returned to baseline of 0.59.  Acute kidney injury likely secondary to severe dehydration that has now resolved.  HSV-2 Controlled.  Patient is sexually active.  Not currently on medications and patient denies active lesions.  Social Per chart review, pt is a frequent no show-er, however has had numerous visits to ED. Patient will need extensive diabetes teaching regarding her insulin therapy. -Encourage close f/u outpatient with PCP -May benefit from DM educator while inpatient; consult placed -CSW consulted to  assess for barriers to follow-up   FEN/GI: carb-modified diet PPx: lovenox  Disposition: home  Subjective:  Patient says she feels better today than yesterday and is open to talking about plans for better management of diabetes outpatient.  She says she struggles with giving herself injections because she doesn't like needles.  Objective: Temp:  [98.5 F (36.9 C)-98.6 F (37 C)] 98.6 F (37 C) (04/02 2011) Pulse Rate:  [110-130] 112 (04/02 2011) Resp:  [12-19] 19 (04/02 2011) BP: (108-128)/(67-88) 128/88 (04/02 2011) SpO2:  [99 %-100 %] 100 % (04/02 2011) Physical Exam: General: alert, relaxed, pleasant and communicative Cardiovascular: tachycardic, regular rhythm Respiratory: CTAB, normal respiratory rate Abdomen: nontender, soft Extremities: no edema, moves all extremities spontaneously  Laboratory: Recent Labs  Lab 10/23/17 0434 10/24/17 1035  WBC 24.0* 7.2  HGB 16.9* 13.1  HCT 49.6* 37.4  PLT 434* 290   Recent Labs  Lab 10/23/17 0434  10/24/17 0042 10/24/17 0538 10/24/17 0912  NA 129*   < > 133* 136 138  K 4.8   < > 3.6 4.6 3.2*  CL 89*   < > 103 105 108  CO2 8*   < > 19* 15* 21*  BUN 22*   < > 6 8 7   CREATININE 1.64*   < > 0.76 0.59 0.59  CALCIUM 9.8   < > 9.2 9.0 9.1  PROT 9.0*  --   --   --   --   BILITOT 1.6*  --   --   --   --   ALKPHOS 180*  --   --   --   --  ALT 30  --   --   --   --   AST 44*  --   --   --   --   GLUCOSE 693*   < > 296* 105* 103*   < > = values in this interval not displayed.    Imaging/Diagnostic Tests: No results found.   Kathrene Alu, MD 10/24/2017, 7:34 AM PGY-1, [place] Intern pager: [phone], text pages welcome

## 2017-10-24 NOTE — Progress Notes (Signed)
npatient Diabetes Program Recommendations  AACE/ADA: New Consensus Statement on Inpatient Glycemic Control (2015)  Target Ranges:  Prepandial:   less than 140 mg/dL      Peak postprandial:   less than 180 mg/dL (1-2 hours)      Critically ill patients:  140 - 180 mg/dL   Lab Results  Component Value Date   GLUCAP 93 10/24/2017   HGBA1C 17.4 (H) 10/23/2017   Review of Glycemic Control  Diabetes history: DM 1 Outpatient Diabetes medications: Lantus 26 units, Novolog 10 units tid with meals Current orders for Inpatient glycemic control: Lantus 26 units, Novolog Sensitive Correction 0-9 units  Inpatient Diabetes Program Recommendations:    Patient has diet ordered and takes meal coverage at home. Patient has DM type 1 and will need a portion of meal coverage while here. Consider Novolog 5 units tid meal coverage if patient consumes at least 50% of meals and glucose is at least 80 mg/dl.  Thanks,  Tama Headings RN, MSN, BC-ADM, [facility] Inpatient Diabetes Coordinator Team Pager [phone] (8a-5p)

## 2017-10-25 LAB — URINE CULTURE

## 2017-10-25 NOTE — Discharge Summary (Signed)
[facility] Discharge Summary  Patient name: Sabrina Sutton Medical record number: [id number] Date of birth: 10/25/1995 Age: 22 y.o. Gender: female Date of Admission: 10/23/2017  Date of Discharge: 10/24/17 Admitting Physician: Blane Ohara McDiarmid, MD  Primary Care Provider: Smiley Houseman, MD Consultants: none  Indication for Hospitalization: DKA  Discharge Diagnoses/Problem List:  Type 1 DM, poorly controlled HSV2  Disposition: home  Discharge Condition: stable, improved  Discharge Exam:  General: alert, relaxed, pleasant and communicative Cardiovascular: tachycardic, regular rhythm Respiratory: CTAB, normal respiratory rate Abdomen: nontender, soft Extremities: no edema, moves all extremities spontaneously  Brief Hospital Course:  Ms. Hyneman was admitted on 10/23/17 after presenting to the ED for DKA after reporting that she had missed some insulin doses at home.  Her glucose was >600, her anion gap was 32, and her pH was 7.016.  Hgb A1c was 17.4.  She did report some abdominal pain, so a UA and urine GC/Chlamydia were ordered (urine chlamydia was not collected). UA was not indicative of UTI.  Urine culture positive for 50,000 colonies group B strep.  She was started treated according to DKA protocol and transitioned to subcutaneous insulin when her anion gap closed, which occurred at around 1800 on 4/2.  Her subcutaneous orders included 26 units Lantus and sliding scale Novolog.  After she had recovered from DKA, barriers to adherence to clinic visits and her insulin regimen were discussed with the patient.  She noted that she did not like to stick herself but was amenable to further diabetes counseling at [facility].  Dr. Buddy Duty (endocrinologist)'s office was called and plans were made for patient to set up care with him since she had not seen an endocrinologist since she was 73 and saw pediatric endocrinology.     Issues for Follow Up:  1. Patient  scheduled to see Dr. Valentina Lucks on Monday for diabetes education.  She would benefit from further discussion of diabetes care and any barriers she has to this care. 2. She will likely need care set up with ophthalmology and any other specialists needed for her diabetes care. 3. Would benefit from birth control discussion since she is sexually active and not currently on contraceptives. 4. Consider STI testing since urine chlamydia never collected  Significant Procedures: none  Significant Labs and Imaging:  Recent Labs  Lab 10/23/17 0434 10/24/17 1035  WBC 24.0* 7.2  HGB 16.9* 13.1  HCT 49.6* 37.4  PLT 434* 290   Recent Labs  Lab 10/23/17 0434  10/23/17 1735 10/23/17 2142 10/24/17 0042 10/24/17 0538 10/24/17 0912  NA 129*   < > 133* 132* 133* 136 138  K 4.8   < > 3.6 3.8 3.6 4.6 3.2*  CL 89*   < > 104 101 103 105 108  CO2 8*   < > 17* 17* 19* 15* 21*  GLUCOSE 693*   < > 149* 293* 296* 105* 103*  BUN 22*   < > 10 8 6 8 7   CREATININE 1.64*   < > 0.68 0.84 0.76 0.59 0.59  CALCIUM 9.8   < > 9.0 9.1 9.2 9.0 9.1  ALKPHOS 180*  --   --   --   --   --   --   AST 44*  --   --   --   --   --   --   ALT 30  --   --   --   --   --   --  ALBUMIN 4.7  --   --   --   --   --   --    < > = values in this interval not displayed.     Results/Tests Pending at Time of Discharge: Gonoccocal urine culture  Discharge Medications:  Allergies as of 10/24/2017   No Known Allergies     Medication List    TAKE these medications   ACCU-CHEK FASTCLIX LANCETS Misc Check sugar 10 x daily What changed:  Another medication with the same name was added. Make sure you understand how and when to take each.   accu-chek soft touch lancets Use as directed. What changed:  You were already taking a medication with the same name, and this prescription was added. Make sure you understand how and when to take each.   acetone (urine) test strip Check ketones per protocol   Alcohol Pads 70 % Pads Use to  wipe skin prior to insulin injections twice daily   glucagon 1 MG injection Inject 1mg  IM if unconscious, seizing, or unable to eat to correct low blood sugar   glucose blood test strip Commonly known as:  ACCU-CHEK AVIVA PLUS Use as instructed   insulin aspart 100 UNIT/ML FlexPen Commonly known as:  NOVOLOG FLEXPEN INJECT 10 UNITS SUBCUTANEOUSLY THREE TIMES DAILY BEFORE MEALS   Insulin Glargine 100 UNIT/ML Solostar Pen Commonly known as:  LANTUS SOLOSTAR INJECT 26 UNITS UNDER THE SKIN DAILY OR AS DIRECTED   Insulin Pen Needle 31G X 5 MM Misc Commonly known as:  B-D UF III MINI PEN NEEDLES CHECK BLOOD SUGAR IN THE MORNING BEFORE EATING, BEFORE EACH MEAL, AND AS NEEDED   ondansetron 4 MG disintegrating tablet Commonly known as:  ZOFRAN-ODT Take 1 tablet (4 mg total) by mouth every 6 (six) hours as needed for nausea or vomiting.       Discharge Instructions: Please refer to Patient Instructions section of EMR for full details.  Patient was counseled important signs and symptoms that should prompt return to medical care, changes in medications, dietary instructions, activity restrictions, and follow up appointments.   Follow-Up Appointments: Follow-up Information    Koval,Peter. Go on 10/29/2017.   Why:  Please go to appointment with Dr. Valentina Lucks at 8:30AM.       Smiley Houseman, MD. Go on 10/31/2017.   Specialty:  Family Medicine Why:  Please go to appointment with Dr. Dallas Schimke at 1:30PM. Contact information: [place] [id number] [phone]           Kathrene Alu, MD 10/25/2017, 12:11 PM PGY-1, [place]

## 2017-10-29 ENCOUNTER — Ambulatory Visit: Payer: Medicaid Other | Admitting: Pharmacist

## 2017-10-29 NOTE — Progress Notes (Signed)
   [facility] Phone: [phone]   Date of Visit: 10/31/2017   HPI:  Hospital Follow Up: DKA - patient admitted from 4/2 to 4/3 for DKA due to missed insulin doses  - Dr. Buddy Duty (endocrinologist)'s office was called and plans were made for patient to set up care with him since she had not seen an endocrinologist since she was 63 and saw pediatric endocrinology.   DM1: - saw the endocrinologist on 4/5. Currently checking cbs 7 times a day per endocrinology.  - in the mornings: the AM 180 but last few have been in 200s - lunch: 200s (mid 200s)  - before dinner: high 100s  - no lows  - Current doses: Lantus: 26 units, Novolong 10 units - snack coverage: 6 units or so (carb counts)  - next endo appointment is May 9th.  - the endocrinologist is having her keep a food log for a week. She is to send this to him at the end of this week.   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 We had made multiple attempts to call and get in touch to inform her.  - she reports that the bumps never went away, even with the medication given to her by the ED physicians (Acyclovir)  - reports it is not painful but sometimes itchy   ROS: See HPI.  [place]:  PMH: DM1 HSV 2 Infection  PHYSICAL EXAM: BP (!) 94/56   Pulse (!) 119   Temp 98 F (36.7 C) (Oral)   Wt 131 lb (59.4 kg)   LMP 10/22/2017   SpO2 98%   BMI 20.52 kg/m  Reports that she is nervous. Denies any lightheadedness.  GEN: NAD CV: RRR, no murmurs, rubs, or gallops PULM: CTAB, normal effort GU: on external vaginal exam, there are a few small (< 0.5cm) flat-topped and globoid shaped papules that are skin color without erythema or tenderness on the vulva. SKIN: No rash or cyanosis; warm and well-perfused EXTR: No lower extremity edema or calf tenderness PSYCH: Mood and affect euthymic, normal rate and  volume of speech NEURO: Awake, alert, no focal deficits grossly, normal speech   ASSESSMENT/PLAN:   Type 1 diabetes mellitus without complication (HCC) Increase Lantus to 30 units daily. Continue Novolog 10units TID and SSI with snack (she is carb counting). Patient to call if she gets lows. Will defer further management to endocrinology.   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 Discussed possible treatment for this which is mainly cosmetic. Patient would like to this about this. She would like to be screened for STI. She is not currently having any symptoms. HIV, RPR, Urine Gc/Chlamydia obtained.     Smiley Houseman, MD PGY [place]

## 2017-10-31 ENCOUNTER — Encounter: Payer: Self-pay | Admitting: Internal Medicine

## 2017-10-31 ENCOUNTER — Other Ambulatory Visit (HOSPITAL_COMMUNITY)
Admission: RE | Admit: 2017-10-31 | Discharge: 2017-10-31 | Disposition: A | Discharge status: Home / Self Care | Payer: Medicaid Other | Source: Ambulatory Visit | Attending: Family Medicine | Admitting: Family Medicine

## 2017-10-31 ENCOUNTER — Ambulatory Visit (INDEPENDENT_AMBULATORY_CARE_PROVIDER_SITE_OTHER): Payer: Medicaid Other | Admitting: Internal Medicine

## 2017-10-31 ENCOUNTER — Other Ambulatory Visit: Payer: Self-pay

## 2017-10-31 VITALS — BP 94/56 | HR 119 | Temp 98.0°F | Wt 131.0 lb

## 2017-10-31 DIAGNOSIS — E109 Type 1 diabetes mellitus without complications: Secondary | ICD-10-CM | POA: Diagnosis not present

## 2017-10-31 DIAGNOSIS — Z113 Encounter for screening for infections with a predominantly sexual mode of transmission: Secondary | ICD-10-CM | POA: Insufficient documentation

## 2017-10-31 DIAGNOSIS — Z09 Encounter for follow-up examination after completed treatment for conditions other than malignant neoplasm: Secondary | ICD-10-CM | POA: Diagnosis present

## 2017-10-31 DIAGNOSIS — A630 Anogenital (venereal) warts: Secondary | ICD-10-CM

## 2017-10-31 DIAGNOSIS — Z114 Encounter for screening for human immunodeficiency virus [HIV]: Secondary | ICD-10-CM | POA: Diagnosis not present

## 2017-10-31 HISTORY — DX: Anogenital (venereal) warts: A63.0

## 2017-10-31 NOTE — Patient Instructions (Addendum)
Please increase your Lantus to 30 units daily. Continue Novolog 10 units three times a day.    Genital Warts Genital warts are small growths in the genital area or anal area. They are caused by a type of germ (HPV virus). This germ is spread from person to person during sex. It can be spread through vaginal, anal, and oral sex. Genital warts can lead to other problems if they are not treated. Follow these instructions at home: Medicines  Apply over-the-counter and prescription medicines only as told by your doctor.  Do not use medicines that are meant for treating hand warts.  Talk with your doctor about using anti-itch creams. General instructions  Do not touch or scratch the warts.  Do not have sex until your treatment is done.  Tell your current and past sexual partners about your condition. They may need treatment.  Keep all follow-up visits as told by your doctor. This is important.  After treatment, use condoms during sex. Other Instructions for Women  Women who have genital warts may need to be checked more often for cervical cancer.  If you become pregnant, tell your doctor that you have had genital warts. The germ can be passed to the baby. Contact a doctor if:  You have redness, swelling, or pain in the area of the treated skin.  You have a fever.  You feel generally sick.  You feel lumps in and around your genital area or anal area.  You have bleeding in your genital area or anal area.  You have pain during sex. This information is not intended to replace advice given to you by your health care provider. Make sure you discuss any questions you have with your health care provider. Document Released: 10/04/2009 Document Revised: 12/16/2015 Document Reviewed: 10/05/2014 Elsevier Interactive Patient Education  [employer].

## 2017-10-31 NOTE — Assessment & Plan Note (Signed)
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 Discussed possible treatment for this which is mainly cosmetic. Patient would like to this about this. She would like to be screened for STI. She is not currently having any symptoms. HIV, RPR, Urine Gc/Chlamydia obtained.

## 2017-10-31 NOTE — Assessment & Plan Note (Signed)
Increase Lantus to 30 units daily. Continue Novolog 10units TID and SSI with snack (she is carb counting). Patient to call if she gets lows. Will defer further management to endocrinology.

## 2017-11-01 ENCOUNTER — Encounter: Payer: Self-pay | Admitting: Internal Medicine

## 2017-11-01 LAB — RPR: RPR Ser Ql: NONREACTIVE

## 2017-11-01 LAB — URINE CYTOLOGY ANCILLARY ONLY
Chlamydia: NEGATIVE
Neisseria Gonorrhea: NEGATIVE

## 2017-11-01 LAB — HIV ANTIBODY (ROUTINE TESTING W REFLEX): HIV SCREEN 4TH GENERATION: NONREACTIVE

## 2017-11-14 ENCOUNTER — Ambulatory Visit: Payer: Medicaid Other | Admitting: *Deleted

## 2017-12-05 ENCOUNTER — Other Ambulatory Visit: Payer: Self-pay | Admitting: Internal Medicine

## 2017-12-15 ENCOUNTER — Other Ambulatory Visit: Payer: Self-pay | Admitting: Internal Medicine

## 2017-12-16 ENCOUNTER — Other Ambulatory Visit: Payer: Self-pay | Admitting: Internal Medicine

## 2018-02-08 ENCOUNTER — Other Ambulatory Visit: Payer: Self-pay | Admitting: Internal Medicine

## 2018-03-13 ENCOUNTER — Ambulatory Visit: Payer: Medicaid Other | Admitting: Family Medicine

## 2018-03-14 ENCOUNTER — Other Ambulatory Visit: Payer: Self-pay | Admitting: Internal Medicine

## 2018-03-14 NOTE — Telephone Encounter (Signed)
Patient has an appointment on 04-15-18 with PCP. Naveyah Iacovelli,CMA

## 2018-04-10 ENCOUNTER — Emergency Department (HOSPITAL_COMMUNITY)
Admission: EM | Admit: 2018-04-10 | Discharge: 2018-04-10 | Disposition: A | Discharge status: Home / Self Care | Payer: Medicaid Other | Attending: Emergency Medicine | Admitting: Emergency Medicine

## 2018-04-10 ENCOUNTER — Encounter (HOSPITAL_COMMUNITY): Payer: Self-pay | Admitting: *Deleted

## 2018-04-10 DIAGNOSIS — R739 Hyperglycemia, unspecified: Secondary | ICD-10-CM

## 2018-04-10 DIAGNOSIS — N3001 Acute cystitis with hematuria: Secondary | ICD-10-CM

## 2018-04-10 DIAGNOSIS — Z79899 Other long term (current) drug therapy: Secondary | ICD-10-CM | POA: Insufficient documentation

## 2018-04-10 DIAGNOSIS — E1065 Type 1 diabetes mellitus with hyperglycemia: Secondary | ICD-10-CM | POA: Diagnosis not present

## 2018-04-10 DIAGNOSIS — Z7722 Contact with and (suspected) exposure to environmental tobacco smoke (acute) (chronic): Secondary | ICD-10-CM | POA: Insufficient documentation

## 2018-04-10 DIAGNOSIS — R112 Nausea with vomiting, unspecified: Secondary | ICD-10-CM | POA: Diagnosis not present

## 2018-04-10 LAB — URINALYSIS, ROUTINE W REFLEX MICROSCOPIC
BILIRUBIN URINE: NEGATIVE
Glucose, UA: >=500 mg/dL — AB
Ketones, ur: NEGATIVE mg/dL
Nitrite: POSITIVE — AB
PH: 6 (ref 5.0–8.0)
Protein, ur: NEGATIVE mg/dL
SPECIFIC GRAVITY, URINE: 1.027 (ref 1.005–1.030)

## 2018-04-10 LAB — COMPREHENSIVE METABOLIC PANEL
ALK PHOS: 125 U/L (ref 38–126)
ALT: 14 U/L (ref 0–44)
AST: 17 U/L (ref 15–41)
Albumin: 3.6 g/dL (ref 3.5–5.0)
Anion gap: 12 (ref 5–15)
BILIRUBIN TOTAL: 0.6 mg/dL (ref 0.3–1.2)
BUN: 8 mg/dL (ref 6–20)
CALCIUM: 9.5 mg/dL (ref 8.9–10.3)
CO2: 25 mmol/L (ref 22–32)
CREATININE: 0.8 mg/dL (ref 0.44–1.00)
Chloride: 95 mmol/L — ABNORMAL LOW (ref 98–111)
Glucose, Bld: 592 mg/dL (ref 70–99)
Potassium: 4.4 mmol/L (ref 3.5–5.1)
SODIUM: 132 mmol/L — AB (ref 135–145)
TOTAL PROTEIN: 6.9 g/dL (ref 6.5–8.1)

## 2018-04-10 LAB — CBC
HEMATOCRIT: 39.3 % (ref 36.0–46.0)
HEMOGLOBIN: 13.1 g/dL (ref 12.0–15.0)
MCH: 29.2 pg (ref 26.0–34.0)
MCHC: 33.3 g/dL (ref 30.0–36.0)
MCV: 87.7 fL (ref 78.0–100.0)
Platelets: 304 10*3/uL (ref 150–400)
RBC: 4.48 MIL/uL (ref 3.87–5.11)
RDW: 13.6 % (ref 11.5–15.5)
WBC: 7.2 10*3/uL (ref 4.0–10.5)

## 2018-04-10 LAB — I-STAT BETA HCG BLOOD, ED (MC, WL, AP ONLY)

## 2018-04-10 LAB — LIPASE, BLOOD: Lipase: 30 U/L (ref 11–51)

## 2018-04-10 LAB — POC URINE PREG, ED: PREG TEST UR: NEGATIVE

## 2018-04-10 MED ORDER — SODIUM CHLORIDE 0.9 % IV BOLUS: 1000.0000 mL | Freq: Once | INTRAVENOUS | Status: DC

## 2018-04-10 MED ORDER — CEPHALEXIN 500 MG PO CAPS: 500.0000 mg | ORAL_CAPSULE | Freq: Two times a day (BID) | ORAL | 0 refills | Status: DC

## 2018-04-10 NOTE — Discharge Instructions (Addendum)
Please read attached information. If you experience any new or worsening signs or symptoms please return to the emergency room for evaluation. Please follow-up with your primary care provider or specialist as discussed. Please use medication prescribed only as directed and discontinue taking if you have any concerning signs or symptoms.   °

## 2018-04-10 NOTE — ED Provider Notes (Signed)
[facility] EMERGENCY DEPARTMENT Provider Note   CSN: [id number] Arrival date & time: 04/10/18  1414     History   Chief Complaint Chief Complaint  Patient presents with  . Emesis  . Back Pain    HPI Sabrina Sutton is a 22 y.o. female.  HPI  22 year old female presents today with complaints of nausea vomiting and low back pain.  Patient notes symptoms started on Monday with intermittent episodes of vomiting.  She notes she is feeling better today.  She notes generalized back pain around the upper lumbar region, worse with movement.  She denies any abdominal pain or fever.  She notes increased urinary frequency.  Notes she is a diabetic and forgot to take her insulin today.  Denies any fever.     Past Medical History:  Diagnosis Date  . Diabetes mellitus without complication ([place]) 6/73/41   + GAD Ab  . DKA (diabetic ketoacidoses) ([place]) 09/20/2015  . Hypokalemia   . Pyelonephritis 04/17/2016    Patient Active Problem List   Diagnosis Date Noted  . Condyloma acuminatum of vulva 10/31/2017  . Acute kidney injury ([place]) 06/13/2017  . 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 Frequent No-show for appointment 11/03/2016  . Type 1 diabetes mellitus without complication ([place]) 93/79/0240  . Hypovolemia due to dehydration   . Diabetic ketoacidosis without coma associated with type 1 diabetes mellitus ([place]) 09/20/2015    Past Surgical History:  Procedure Laterality Date  . NO PAST SURGERIES       OB History   None      Home Medications    Prior to Admission medications   Medication Sig Start Date End Date Taking? Authorizing Provider  ACCU-CHEK FASTCLIX LANCETS MISC Check sugar 10 x daily 06/15/17   Rogue Bussing, MD  acetone, urine, test strip Check ketones per protocol 09/23/15   Levon Hedger, MD  Alcohol Swabs (ALCOHOL PADS) 70 % PADS Use to wipe skin prior to insulin injections twice daily 09/23/15   Levon Hedger, MD    cephALEXin (KEFLEX) 500 MG capsule Take 1 capsule (500 mg total) by mouth 2 (two) times daily. 04/10/18   Lannie Heaps, Dellis Filbert, PA-C  glucagon 1 MG injection Inject 1mg  IM if unconscious, seizing, or unable to eat to correct low blood sugar 09/23/15   Levon Hedger, MD  glucose blood (ACCU-CHEK AVIVA PLUS) test strip Use as instructed 06/15/17   Rogue Bussing, MD  Insulin Pen Needle (B-D UF III MINI PEN NEEDLES) 31G X 5 MM MISC CHECK BLOOD SUGAR IN THE MORNING BEFORE EATING, BEFORE EACH MEAL, AND AS NEEDED 06/15/17   Rogue Bussing, MD  Insulin Pen Needle (B-D UF III MINI PEN NEEDLES) 31G X 5 MM MISC USE TO CHECK BLOOD SUGAR IN THE MORNING BEFORE EATING, BEFORE EACH MEAL, AND AS NEEDED 02/08/18   Lucila Maine C, DO  Insulin Pen Needle (B-D UF III MINI PEN NEEDLES) 31G X 5 MM MISC USE TO CHECK BLOOD SUGAR IN THE MORNING BEFORE EATING, BEFORE EACH MEAL, AND AS NEEDED 02/08/18   Lucila Maine C, DO  Lancets (ACCU-CHEK SOFT TOUCH) lancets Use as directed. 10/24/17   Kathrene Alu, MD  LANTUS SOLOSTAR 100 UNIT/ML Solostar Pen INJECT 26 UNITS UNDER THE SKIN DAILY OR AS DIRECTED 03/14/18   Riccio, Levada Dy C, DO  NOVOLOG FLEXPEN 100 UNIT/ML FlexPen ADMINISTER 10 UNITS UNDER THE SKIN THREE TIMES DAILY BEFORE MEALS 03/14/18   Lucila Maine C, DO  ondansetron (ZOFRAN-ODT) 4  MG disintegrating tablet Take 1 tablet (4 mg total) by mouth every 6 (six) hours as needed for nausea or vomiting. 06/15/17   Rogue Bussing, MD    Family History Family History  Problem Relation Age of Onset  . Diabetes Maternal Grandmother   . Heart disease Maternal Grandmother        Deceased from MI at age 72  . Hypertension Maternal Grandmother   . Hypercholesterolemia Mother   . Seizures Mother   . Kidney Stones Mother   . Hyperlipidemia Mother   . Stroke Maternal Grandfather        Deceased from stroke at age 86  . Hypertension Paternal Grandmother   . Healthy Father     Social  History Social History   Tobacco Use  . Smoking status: Passive Smoke Exposure - Never Smoker  . Smokeless tobacco: Never Used  Substance Use Topics  . Alcohol use: Yes    Comment: Last drank 1 month ago per MD note  . Drug use: Yes    Types: Marijuana    Comment: Last used 1  month ago per MD note     Allergies   Patient has no known allergies.   Review of Systems Review of Systems  All other systems reviewed and are negative.  Physical Exam Updated Vital Signs BP 128/74 (BP Location: Right Arm)   Pulse 78   Temp 98.9 F (37.2 C) (Oral)   Resp 20   SpO2 100%   Physical Exam  Constitutional: She is oriented to person, place, and time. She appears well-developed and well-nourished.  HENT:  Head: Normocephalic and atraumatic.  Eyes: Pupils are equal, round, and reactive to light. Conjunctivae are normal. Right eye exhibits no discharge. Left eye exhibits no discharge. No scleral icterus.  Neck: Normal range of motion. No JVD present. No tracheal deviation present.  Pulmonary/Chest: Effort normal. No stridor.  Abdominal: Soft. She exhibits no distension and no mass. There is no tenderness. There is no rebound and no guarding. No hernia.  Musculoskeletal:  Minimal tenderness palpation of the bilateral lumbar musculature no midline tenderness  Neurological: She is alert and oriented to person, place, and time. Coordination normal.  Skin: Skin is warm.  Psychiatric: She has a normal mood and affect. Her behavior is normal. Judgment and thought content normal.  Nursing note and vitals reviewed.    ED Treatments / Results  Labs (all labs ordered are listed, but only abnormal results are displayed) Labs Reviewed  URINALYSIS, ROUTINE W REFLEX MICROSCOPIC - Abnormal; Notable for the following components:      Result Value   APPearance HAZY (*)    Glucose, UA >=500 (*)    Hgb urine dipstick LARGE (*)    Nitrite POSITIVE (*)    Leukocytes, UA TRACE (*)    Bacteria, UA  FEW (*)    All other components within normal limits  COMPREHENSIVE METABOLIC PANEL - Abnormal; Notable for the following components:   Sodium 132 (*)    Chloride 95 (*)    Glucose, Bld 592 (*)    All other components within normal limits  LIPASE, BLOOD  CBC  POC URINE PREG, ED  I-STAT BETA HCG BLOOD, ED ([facility], [facility], [facility] ONLY)    EKG None  Radiology No results found.  Procedures Procedures (including critical care time)  Medications Ordered in ED Medications - No data to display   Initial Impression / Assessment and Plan / ED Course  I have reviewed the triage  vital signs and the nursing notes.  Pertinent labs & imaging results that were available during my care of the patient were reviewed by me and considered in my medical decision making (see chart for details).      Labs: Point-of-care urine prior, i-STAT beta-hCG, lipase, CMP, CBC, urinalysis  Imaging:  Consults:  Therapeutics:  Discharge Meds: Keflex  Assessment/Plan: 22 year old female presents today with complaints of nausea and vomiting.  She is well-appearing in no acute distress at the time my evaluation.  Patient's blood sugar significantly elevated at 592.  She has no other significant electrolyte derangement, no signs of DKA.  She does have urine that appears to indicate urinary tract infection, given her symptoms of dysuria she will be started on antibiotics.  Patient unwilling to stay for further evaluation and management.  She understands that her blood sugar significantly elevated, she understands that by not addressing it immediately this could lead to permanent disability and death.  She verbalizes the understanding and risks of leaving [facility].  She is advised to return the emergency room at any time for repeat evaluation and management.      Final Clinical Impressions(s) / ED Diagnoses   Final diagnoses:  Hyperglycemia  Acute cystitis with hematuria    ED Discharge Orders          Ordered    cephALEXin (KEFLEX) 500 MG capsule  2 times daily     04/10/18 1705           Okey Regal, PA-C 04/11/18 1553    Davonna Belling, MD 04/13/18 [id number]

## 2018-04-10 NOTE — ED Notes (Signed)
Upon entering room pt reports she does not want the IV and IV fluids, explained importance of intervention, PA Merry Proud notified and will speak with patient.

## 2018-04-10 NOTE — ED Triage Notes (Addendum)
Pt in c/o lower back pain with n/v x1 yesterday, denies today, no distress noted, reports she has been having trouble sleeping lately as well, denies vaginal discharge or dysuria

## 2018-04-12 ENCOUNTER — Other Ambulatory Visit: Payer: Self-pay | Admitting: Family Medicine

## 2018-04-13 ENCOUNTER — Emergency Department (HOSPITAL_COMMUNITY)
Admission: EM | Admit: 2018-04-13 | Discharge: 2018-04-13 | Disposition: A | Discharge status: Home / Self Care | Payer: Medicaid Other | Attending: Emergency Medicine | Admitting: Emergency Medicine

## 2018-04-13 ENCOUNTER — Encounter (HOSPITAL_COMMUNITY): Payer: Self-pay | Admitting: Emergency Medicine

## 2018-04-13 ENCOUNTER — Other Ambulatory Visit: Payer: Self-pay

## 2018-04-13 DIAGNOSIS — Z79899 Other long term (current) drug therapy: Secondary | ICD-10-CM | POA: Insufficient documentation

## 2018-04-13 DIAGNOSIS — E1065 Type 1 diabetes mellitus with hyperglycemia: Secondary | ICD-10-CM | POA: Diagnosis present

## 2018-04-13 DIAGNOSIS — Z7722 Contact with and (suspected) exposure to environmental tobacco smoke (acute) (chronic): Secondary | ICD-10-CM | POA: Insufficient documentation

## 2018-04-13 DIAGNOSIS — Z794 Long term (current) use of insulin: Secondary | ICD-10-CM | POA: Diagnosis not present

## 2018-04-13 DIAGNOSIS — E1010 Type 1 diabetes mellitus with ketoacidosis without coma: Secondary | ICD-10-CM | POA: Diagnosis not present

## 2018-04-13 LAB — BASIC METABOLIC PANEL
Anion gap: 22 — ABNORMAL HIGH (ref 5–15)
Anion gap: 9 (ref 5–15)
BUN: 15 mg/dL (ref 6–20)
BUN: 10 mg/dL (ref 6–20)
CALCIUM: 9.8 mg/dL (ref 8.9–10.3)
CALCIUM: 8.9 mg/dL (ref 8.9–10.3)
CO2: 16 mmol/L — AB (ref 22–32)
CO2: 25 mmol/L (ref 22–32)
CREATININE: 1.12 mg/dL — AB (ref 0.44–1.00)
CREATININE: 0.71 mg/dL (ref 0.44–1.00)
Chloride: 94 mmol/L — ABNORMAL LOW (ref 98–111)
Chloride: 102 mmol/L (ref 98–111)
GFR calc Af Amer: >60 mL/min (ref >60)
GFR calc non Af Amer: >60 mL/min (ref >60)
GFR calc non Af Amer: >60 mL/min (ref >60)
GLUCOSE: 355 mg/dL — AB (ref 70–99)
Glucose, Bld: 204 mg/dL — ABNORMAL HIGH (ref 70–99)
Potassium: 4.4 mmol/L (ref 3.5–5.1)
Potassium: 3.4 mmol/L — ABNORMAL LOW (ref 3.5–5.1)
SODIUM: 136 mmol/L (ref 135–145)
Sodium: 132 mmol/L — ABNORMAL LOW (ref 135–145)

## 2018-04-13 LAB — URINALYSIS, ROUTINE W REFLEX MICROSCOPIC
Bilirubin Urine: NEGATIVE
Glucose, UA: >=500 mg/dL — AB
Hgb urine dipstick: NEGATIVE
Ketones, ur: 80 mg/dL — AB
Leukocytes, UA: NEGATIVE
Nitrite: NEGATIVE
PH: 5 (ref 5.0–8.0)
PROTEIN: NEGATIVE mg/dL
Specific Gravity, Urine: 1.027 (ref 1.005–1.030)

## 2018-04-13 LAB — CBC
HCT: 44.1 % (ref 36.0–46.0)
Hemoglobin: 14.4 g/dL (ref 12.0–15.0)
MCH: 29.1 pg (ref 26.0–34.0)
MCHC: 32.7 g/dL (ref 30.0–36.0)
MCV: 89.3 fL (ref 78.0–100.0)
PLATELETS: 417 10*3/uL — AB (ref 150–400)
RBC: 4.94 MIL/uL (ref 3.87–5.11)
RDW: 13.6 % (ref 11.5–15.5)
WBC: 12.8 10*3/uL — ABNORMAL HIGH (ref 4.0–10.5)

## 2018-04-13 LAB — I-STAT TROPONIN, ED: TROPONIN I, POC: 0 ng/mL (ref 0.00–0.08)

## 2018-04-13 LAB — CBG MONITORING, ED
GLUCOSE-CAPILLARY: 226 mg/dL — AB (ref 70–99)
Glucose-Capillary: 340 mg/dL — ABNORMAL HIGH (ref 70–99)
Glucose-Capillary: 184 mg/dL — ABNORMAL HIGH (ref 70–99)
Glucose-Capillary: 129 mg/dL — ABNORMAL HIGH (ref 70–99)

## 2018-04-13 LAB — HCG, QUANTITATIVE, PREGNANCY: hCG, Beta Chain, Quant, S: 1 m[IU]/mL (ref <5)

## 2018-04-13 MED ORDER — SODIUM CHLORIDE 0.9 % IV SOLN
INTRAVENOUS | Status: DC
  Administered 2018-04-13: 18:00:00 via INTRAVENOUS

## 2018-04-13 MED ORDER — SODIUM CHLORIDE 0.9 % IV BOLUS
1000.0000 mL | Freq: Once | INTRAVENOUS | Status: AC
  Administered 2018-04-13: 1000 mL via INTRAVENOUS

## 2018-04-13 MED ORDER — SODIUM CHLORIDE 0.9 % IV SOLN
INTRAVENOUS | Status: DC
  Administered 2018-04-13: 2.8 [IU]/h via INTRAVENOUS
  Filled 2018-04-13: qty 1

## 2018-04-13 MED ORDER — DEXTROSE-NACL 5-0.45 % IV SOLN
INTRAVENOUS | Status: DC
  Administered 2018-04-13: 16:00:00 via INTRAVENOUS

## 2018-04-13 NOTE — ED Provider Notes (Signed)
[facility] EMERGENCY DEPARTMENT Provider Note   CSN: [id number] Arrival date & time: 04/13/18  1225     History   Chief Complaint Chief Complaint  Patient presents with  . Hyperglycemia    HPI Sabrina Sutton is a 22 y.o. female who presents the emergency department with chief complaint of hyperglycemia.  Patient was diagnosed with type 1 diabetes 2 years ago.  She uses insulin by injection daily.  Patient states that last night she ate ice cream and forgot to take her insulin when she awoke this morning she was sick to her stomach with 3 episodes of vomiting some epigastric abdominal pain and noticed that her blood sugar was greater than 300.  She did not take her home insulin but came to the emergency department.  Patient states that she also was diagnosed with a urinary tract infection 2 weeks ago but did not have the time to go get her antibiotic.  She denies any current urinary symptoms.  Patient denies fevers, chills, abdominal pain, diarrhea, cough.  HPI  Past Medical History:  Diagnosis Date  . Diabetes mellitus without complication ([place]) 10/31/79   + GAD Ab  . DKA (diabetic ketoacidoses) ([place]) 09/20/2015  . Hypokalemia   . Pyelonephritis 04/17/2016    Patient Active Problem List   Diagnosis Date Noted  . Condyloma acuminatum of vulva 10/31/2017  . Acute kidney injury ([place]) 06/13/2017  . 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 Frequent No-show for appointment 11/03/2016  . Type 1 diabetes mellitus without complication ([place]) 19/14/7829  . Hypovolemia due to dehydration   . Diabetic ketoacidosis without coma associated with type 1 diabetes mellitus ([place]) 09/20/2015    Past Surgical History:  Procedure Laterality Date  . NO PAST SURGERIES       OB History   None      Home Medications    Prior to Admission medications   Medication Sig Start Date End Date Taking? Authorizing Provider  ACCU-CHEK FASTCLIX LANCETS MISC Check sugar 10 x daily 06/15/17    Rogue Bussing, MD  acetone, urine, test strip Check ketones per protocol 09/23/15   Levon Hedger, MD  Alcohol Swabs (ALCOHOL PADS) 70 % PADS Use to wipe skin prior to insulin injections twice daily 09/23/15   Levon Hedger, MD  cephALEXin (KEFLEX) 500 MG capsule Take 1 capsule (500 mg total) by mouth 2 (two) times daily. 04/10/18   Hedges, Dellis Filbert, PA-C  glucagon 1 MG injection Inject 1mg  IM if unconscious, seizing, or unable to eat to correct low blood sugar 09/23/15   Levon Hedger, MD  glucose blood (ACCU-CHEK AVIVA PLUS) test strip Use as instructed 06/15/17   Rogue Bussing, MD  Insulin Pen Needle (B-D UF III MINI PEN NEEDLES) 31G X 5 MM MISC CHECK BLOOD SUGAR IN THE MORNING BEFORE EATING, BEFORE EACH MEAL, AND AS NEEDED 06/15/17   Rogue Bussing, MD  Insulin Pen Needle (B-D UF III MINI PEN NEEDLES) 31G X 5 MM MISC USE TO CHECK BLOOD SUGAR IN THE MORNING BEFORE EATING, BEFORE EACH MEAL, AND AS NEEDED 02/08/18   Lucila Maine C, DO  Insulin Pen Needle (B-D UF III MINI PEN NEEDLES) 31G X 5 MM MISC USE TO CHECK BLOOD SUGAR IN THE MORNING BEFORE EATING, BEFORE EACH MEAL, AND AS NEEDED 02/08/18   Lucila Maine C, DO  Lancets (ACCU-CHEK SOFT TOUCH) lancets Use as directed. 10/24/17   Kathrene Alu, MD  LANTUS SOLOSTAR 100 UNIT/ML Solostar Pen [employer]  26 UNITS UNDER THE SKIN DAILY OR AS DIRECTED 03/14/18   Riccio, Levada Dy C, DO  NOVOLOG FLEXPEN 100 UNIT/ML FlexPen ADMINISTER 10 UNITS UNDER THE SKIN THREE TIMES DAILY BEFORE MEALS 03/14/18   Riccio, Angela C, DO  ondansetron (ZOFRAN-ODT) 4 MG disintegrating tablet Take 1 tablet (4 mg total) by mouth every 6 (six) hours as needed for nausea or vomiting. 06/15/17   Rogue Bussing, MD    Family History Family History  Problem Relation Age of Onset  . Diabetes Maternal Grandmother   . Heart disease Maternal Grandmother        Deceased from MI at age 67  . Hypertension Maternal Grandmother     . Hypercholesterolemia Mother   . Seizures Mother   . Kidney Stones Mother   . Hyperlipidemia Mother   . Stroke Maternal Grandfather        Deceased from stroke at age 32  . Hypertension Paternal Grandmother   . Healthy Father     Social History Social History   Tobacco Use  . Smoking status: Passive Smoke Exposure - Never Smoker  . Smokeless tobacco: Never Used  Substance Use Topics  . Alcohol use: Yes    Comment: Last drank 1 month ago per MD note  . Drug use: Yes    Types: Marijuana    Comment: Last used 1  month ago per MD note     Allergies   Patient has no known allergies.   Review of Systems Review of Systems Ten systems reviewed and are negative for acute change, except as noted in the HPI.    Physical Exam Updated Vital Signs BP 109/72 (BP Location: Right Arm)   Pulse (!) 117   Temp 98.5 F (36.9 C) (Oral)   Resp (!) 26   LMP 03/13/2018   SpO2 97%   Physical Exam  Physical Exam  Nursing note and vitals reviewed. Constitutional: She is oriented to person, place, and time. She appears well-developed and well-nourished. No distress.  HENT:  Head: Normocephalic and atraumatic.  Eyes: Conjunctivae normal and EOM are normal. Pupils are equal, round, and reactive to light. No scleral icterus.  Neck: Normal range of motion.  Cardiovascular: Normal rate, regular rhythm and normal heart sounds.  Exam reveals no gallop and no friction rub.   No murmur heard. Pulmonary/Chest: Effort normal and breath sounds normal. No respiratory distress.  Abdominal: Soft. Bowel sounds are normal. She exhibits no distension and no mass. There is no tenderness. There is no guarding. No CVA tenderness neurological: She is alert and oriented to person, place, and time.  Skin: Skin is warm and dry. She is not diaphoretic.    ED Treatments / Results  Labs (all labs ordered are listed, but only abnormal results are displayed) Labs Reviewed  BASIC METABOLIC PANEL - Abnormal;  Notable for the following components:      Result Value   Sodium 132 (*)    Chloride 94 (*)    CO2 16 (*)    Glucose, Bld 355 (*)    Creatinine, Ser 1.12 (*)    Anion gap 22 (*)    All other components within normal limits  CBC - Abnormal; Notable for the following components:   WBC 12.8 (*)    Platelets 417 (*)    All other components within normal limits  URINALYSIS, ROUTINE W REFLEX MICROSCOPIC - Abnormal; Notable for the following components:   Color, Urine STRAW (*)    Glucose, UA >=500 (*)  Ketones, ur 80 (*)    Bacteria, UA RARE (*)    All other components within normal limits  CBG MONITORING, ED - Abnormal; Notable for the following components:   Glucose-Capillary 340 (*)    All other components within normal limits  I-STAT BETA HCG BLOOD, ED ([facility], [facility], [facility] ONLY)  I-STAT TROPONIN, ED    EKG None  Radiology No results found.  Procedures .Critical Care Performed by: Margarita Mail, PA-C Authorized by: Margarita Mail, PA-C   Critical care provider statement:    Critical care time (minutes):  45   Critical care was necessary to treat or prevent imminent or life-threatening deterioration of the following conditions:  Metabolic crisis   Critical care was time spent personally by me on the following activities:  Discussions with consultants, evaluation of patient's response to treatment, examination of patient, ordering and performing treatments and interventions, ordering and review of laboratory studies, ordering and review of radiographic studies, pulse oximetry, re-evaluation of patient's condition, obtaining history from patient or surrogate and review of old charts   (including critical care time)  Medications Ordered in ED Medications - No data to display   Initial Impression / Assessment and Plan / ED Course  I have reviewed the triage vital signs and the nursing notes.  Pertinent labs & imaging results that were available during my care of the patient  were reviewed by me and considered in my medical decision making (see chart for details).  Clinical Course as of Apr 13 2329  Sat Apr 13, 2018  1651 Recent blood sugar elevated to about 355 on BMP.  She is an anion gap of 22 and ketones in her urine suggestive of DKA.  Patient receiving fluids and IV insulin.   [AH]    Clinical Course User Index [AH] Margarita Mail, PA-C    Patient here with DKA after not using her insulin and eating ice cream.  Anion gap of 22.  Patient placed on the glucose stabilizer with IV insulin drip.  She was able to close her anion gap.  Given fluids and insulin.  Patient very well-appearing.  I reviewed her lab work and she has no signs of active urinary tract infection although she did not treat it with any antibiotic.  Patient's re-Pete BMP shows her blood glucose 204 but anion gap of 9.  Patient appears appropriate for discharge at this time to use her home insulin therapy.  She is having no fever or back pain.  Gust return precautions.   Final Clinical Impressions(s) / ED Diagnoses   Final diagnoses:  None    ED Discharge Orders    None       Margarita Mail, PA-C 04/13/18 2331    Charlesetta Shanks, MD 04/14/18 0001

## 2018-04-13 NOTE — Discharge Instructions (Addendum)
Contact a health care provider if: Your blood glucose level is too high or too low. You have ketones in your urine. You have a fever. You cannot eat. You cannot tolerate fluids. You have been vomiting for more than 2 hours. You continue to have symptoms of this condition. You develop new symptoms. Get help right away if: Your blood glucose levels continue to be high (elevated). Your monitor reads high even when you are taking insulin. You faint. You have chest pain. You have trouble breathing. You have a sudden, severe headache. You have sudden weakness in one arm or one leg. You have sudden trouble speaking or swallowing. You have vomiting or diarrhea that gets worse after 3 hours. You feel severely fatigued. You have trouble thinking. You have abdominal pain. You are severely dehydrated. Symptoms of severe dehydration include: Extreme thirst. Dry mouth. Blue lips. Cold hands and feet. Rapid breathing.

## 2018-04-13 NOTE — ED Triage Notes (Addendum)
Pt states CBG was normal yesterday throughout the day. The pt states she ate ice cream last night and forgot to take her insulin. Woke up and threw up and her CBG was in the 300s. Has not taken any insulin today. Pt states she also has a UTI, was diagnosed 2 weeks ago but has not had time to get her antibiotic filled since then.

## 2018-04-15 ENCOUNTER — Ambulatory Visit (INDEPENDENT_AMBULATORY_CARE_PROVIDER_SITE_OTHER): Payer: Medicaid Other | Admitting: Family Medicine

## 2018-04-15 ENCOUNTER — Encounter: Payer: Self-pay | Admitting: Family Medicine

## 2018-04-15 ENCOUNTER — Other Ambulatory Visit: Payer: Self-pay

## 2018-04-15 ENCOUNTER — Other Ambulatory Visit (HOSPITAL_COMMUNITY)
Admission: RE | Admit: 2018-04-15 | Discharge: 2018-04-15 | Disposition: A | Discharge status: Home / Self Care | Payer: Medicaid Other | Source: Ambulatory Visit | Attending: Family Medicine | Admitting: Family Medicine

## 2018-04-15 VITALS — BP 108/70 | HR 108 | Temp 98.0°F | Ht 67.0 in | Wt 131.0 lb

## 2018-04-15 DIAGNOSIS — E109 Type 1 diabetes mellitus without complications: Secondary | ICD-10-CM | POA: Diagnosis not present

## 2018-04-15 DIAGNOSIS — Z113 Encounter for screening for infections with a predominantly sexual mode of transmission: Secondary | ICD-10-CM | POA: Insufficient documentation

## 2018-04-15 LAB — GLUCOSE, POCT (MANUAL RESULT ENTRY): POC GLUCOSE: 286 mg/dL — AB (ref 70–99)

## 2018-04-15 LAB — POCT GLYCOSYLATED HEMOGLOBIN (HGB A1C): HBA1C, POC (CONTROLLED DIABETIC RANGE): 13.4 % — AB (ref 0.0–7.0)

## 2018-04-15 NOTE — Progress Notes (Signed)
    Subjective:    Patient ID: Sabrina Sutton, female    DOB: September 06, 1995, 22 y.o.   MRN: [id number]   CC: follow up T1DM  Patient was diagnosed with T1DM about 2 years ago. She has struggled with controlling her sugars. She reports she is finally getting used to having T1DM. She reports good family support. She lives with mom, mom's boyfriend, and her nephew. She works at [employer].   She reports the hardest part of T1DM is injecting herself with insulin. She often avoids it. She went to ED this weekend after not taking insulin for 2-3 days due to avoidance. She was treated with insulin drip for DKA. AG closed x 2 and she was sent home. She has since been adherent with insulin. This morning her CBG was 150. She checks CBGs 3-4 times, fasting and w/ meals. She takes lantus 30 units every morning and 10 units novolog with meals. She reports having enough supply of her medication. She voices understanding of the need for insulin as her body does not make it for her. She reports she more often has high sugars than lows. When sugars high she feels weak, hot, feels nauseated - maybe happens 3 x week  When sugars low feels like she'll pass out - maybe 1 x week occurrence.  She endorses increased thirst. Denies increased urination or increased hunger.  She needs to see eye doctor and dentist. Denies other needs or concerns at this time.  Smoking status reviewed- non-smoker  Review of Systems- see HPI   Objective:  BP 108/70   Pulse (!) 108   Temp 98 F (36.7 C) (Oral)   Ht 5\' 7"  (1.702 m)   Wt 131 lb (59.4 kg)   LMP 03/25/2018 (Exact Date)   SpO2 99%   BMI 20.52 kg/m  Vitals and nursing note reviewed  General: well nourished, in no acute distress HEENT: normocephalic, MMM, no erythema or exudate in posterior oropharynx. Neck: supple, non-tender, without lymphadenopathy Cardiac: RRR, clear S1 and S2, no murmurs, rubs, or gallops Respiratory: clear to auscultation bilaterally, no  increased work of breathing Abdomen: soft, nontender Extremities: no edema or cyanosis Skin: warm and dry, no rashes noted Neuro: alert and oriented, no focal deficits   Assessment & Plan:    Type 1 diabetes mellitus without complication ([place])  Poorly controlled due to not taking insulin as prescribed. She does not like to inject herself. She did have improvement in A1C from 17.4 to 13.4 over past several months. Discussed importance of taking insulin as prescribed. At this point I do not want to change around her insulin regimen, I think if she were adherent she would have better control. I do want her to follow up with pharmacy clinic in 1 month to review sugars. Her fasting this morning was quite good. She is able to tell me the importance of eating well. If sugars still elevated in 1 month would change regimen. Follow up w/ me in 3 months for next A1C. Referral made to ophthalmology for eye exam. Urine microalbumin ordered today.     Return in about 4 weeks (around 05/13/2018).   Lucila Maine, DO Family Medicine Resident PGY-3

## 2018-04-15 NOTE — Patient Instructions (Signed)
Good to see you today!  Please continue to stick to your insulin plan.   We'll have you see Dr. Valentina Lucks to check in at the end of October.  I'll see you back in 3 months to repeat A1C.  If you have questions or concerns please do not hesitate to call at [phone].  Lucila Maine, DO PGY-3, [facility] 04/15/2018 3:12 PM    Type 1 Diabetes Mellitus, Self Care, Adult When you have type 1 diabetes (type 1 diabetes mellitus), you must keep your blood sugar (glucose) under control. You can do this with:  Insulin.  Nutrition.  Exercise.  Lifestyle changes.  Other medicines, if needed.  Support from your doctors and others.  How do I manage my blood sugar?  Check your blood sugar every day, as often as told.  Call your doctor if your blood sugar is above your goal numbers for 2 tests in a row.  Have your A1c (hemoglobin A1c) level checked at least twice a year. Have it checked more often if your doctor tells you to. Your doctor will set treatment goals for you. Generally, you should have these blood sugar levels:  Before meals (preprandial): 80-130 mg/dL (4.4-7.2 mmol/L).  After meals (postprandial): below 180 mg/dL (10 mmol/L).  A1c level: less than 7%.  What do I need to know about high blood sugar? High blood sugar is called hyperglycemia. Know the signs of high blood sugar. Signs may include:  Feeling: ? Thirsty. ? Hungry. ? Very tired.  Needing to pee (urinate) more than usual.  Blurry vision.  What do I need to know about low blood sugar? Low blood sugar is called hypoglycemia. This is when blood sugar is at or below 70 mg/dL (3.9 mmol/L). Symptoms may include:  Feeling: ? Hungry. ? Worried or nervous (anxious). ? Sweaty and clammy. ? Confused. ? Dizzy. ? Sleepy. ? Sick to your stomach (nauseous).  Having: ? A fast heartbeat. ? A headache. ? A change in your vision. ? Jerky movements that you cannot control  (seizure). ? Nightmares. ? Tingling or no feeling (numbness) around the mouth, lips, or tongue.  Having trouble with: ? Talking. ? Paying attention (concentrating). ? Moving (coordination). ? Sleeping.  Shaking.  Passing out (fainting).  Getting upset easily (irritability).  Treating low blood sugar  To treat low blood sugar, eat or drink something sugary right away. If you can think clearly and swallow safely, follow the 15:15 rule:  Take 15 grams of a fast-acting carb (carbohydrate). Some fast-acting carbs are: ? 1 tube of glucose gel. ? 3 sugar tablets (glucose pills). ? 6-8 pieces of hard candy. ? 4 oz (120 mL) of fruit juice. ? 4 oz (120 mL) regular (not diet) soda.  Check your blood sugar 15 minutes after you take the carb.  If your blood sugar is still at or below 70 mg/dL (3.9 mmol/L), take 15 grams of a carb again.  If your blood sugar does not go above 70 mg/dL (3.9 mmol/L) after 3 tries, get help right away.  After your blood sugar goes back to normal, eat a meal or a snack within 1 hour.  Treating very low blood sugar If your blood sugar is at or below 54 mg/dL (3 mmol/L), you have very low blood sugar (severe hypoglycemia). This is an emergency. Do not wait to see if the symptoms will go away. Get medical help right away. Call your local emergency services (911 in the [place] Do not drive  yourself to the hospital. If you have very low blood sugar and you cannot eat or drink, you may need a glucagon shot (injection). A family member or friend should learn how to check your blood sugar and how to give you a glucagon shot. Ask your doctor if you need to have a glucagon shot kit at home. What else is important to manage my diabetes? Medicine  Take insulin and diabetes medicines as told.  Adjust your insulin and medicines as told.  Do not run out of insulin or medicines. Having diabetes can put you at risk for other long-term (chronic) conditions. These may  include heart disease and kidney disease. Your doctor may prescribe medicines to help prevent problems from diabetes. Food  Make healthy food choices. These include: ? Chicken, fish, egg whites, and beans. ? Oats, whole wheat, bulgur, brown rice, quinoa, and millet. ? Fresh fruits and vegetables. ? Low-fat dairy products. ? Nuts, avocado, olive oil, and canola oil.  Meet with a food specialist (registered dietitian). He or she can help you make an eating plan that is right for you.  Follow instructions from your doctor about what you cannot eat or drink.  Drink enough fluid to keep your pee (urine) clear or pale yellow.  Eat healthy snacks between healthy meals.  Keep track of carbs that you eat. Do this by reading food labels and learning food serving sizes.  Follow your sick day plan when you cannot eat or drink normally. Make this plan with your doctor so it is ready to use. Activity   Exercise at least 3 times a week.  Do not go more than 2 days without exercising.  Talk with your doctor before you start a new exercise. Your doctor may need to adjust your insulin, medicines, or food. Lifestyle   Do not use any tobacco products. These include cigarettes, chewing tobacco, and e-cigarettes. If you need help quitting, ask your doctor.  Ask your doctor how much alcohol is safe for you.  Learn to deal with stress. If you need help with this, ask your doctor. Body care  Stay up to date with your shots (immunizations).  Have your eyes and feet checked by a doctor as often as told.  Check your skin and feet every day. Check for cuts, bruises, redness, blisters, or sores.  Brush your teeth and gums two times a day, and floss at least one time a day.  Go to the dentist least one time every 6 months.  Stay at a healthy weight. General instructions   Take over-the-counter and prescription medicines only as told by your doctor.  Share your diabetes care plan with: ? Your  work or school. ? People you live with.  Check your pee (urine) for ketones: ? When you are sick. ? As told by your doctor.  Carry a card or wear jewelry that says that you have diabetes.  Ask your doctor: ? Do I need to meet with a diabetes educator? ? Where can I find a support group for people with diabetes?  Keep all follow-up visits as told by your doctor. This is important. Where to find more information: To learn more about diabetes, visit:  American Diabetes Association: www.diabetes.org  American Association of Diabetes Educators: www.diabeteseducator.org/patient-resources  This information is not intended to replace advice given to you by your health care provider. Make sure you discuss any questions you have with your health care provider. Document Released: 11/01/2015 Document Revised: 12/16/2015 Document Reviewed: 08/13/2015  Chartered certified accountant Patient Education  [employer].

## 2018-04-15 NOTE — Assessment & Plan Note (Addendum)
  Poorly controlled due to not taking insulin as prescribed. She does not like to inject herself. She did have improvement in A1C from 17.4 to 13.4 over past several months. Discussed importance of taking insulin as prescribed. At this point I do not want to change around her insulin regimen, I think if she were adherent she would have better control. I do want her to follow up with pharmacy clinic in 1 month to review sugars. Her fasting this morning was quite good. She is able to tell me the importance of eating well. If sugars still elevated in 1 month would change regimen. Follow up w/ me in 3 months for next A1C. Referral made to ophthalmology for eye exam. Urine microalbumin ordered today.

## 2018-04-16 LAB — MICROALBUMIN, URINE: Microalbumin, Urine: 18.4 ug/mL

## 2018-04-16 LAB — URINE CYTOLOGY ANCILLARY ONLY: Chlamydia: NEGATIVE

## 2018-05-02 ENCOUNTER — Other Ambulatory Visit: Payer: Self-pay | Admitting: *Deleted

## 2018-05-02 NOTE — Telephone Encounter (Signed)
Pharmacy needs specific directions so insurance will pay.  They say that pt has been getting 100 strips every 10 days. Fleeger, Salome Spotted, CMA

## 2018-05-03 ENCOUNTER — Encounter (HOSPITAL_COMMUNITY): Payer: Self-pay

## 2018-05-03 ENCOUNTER — Emergency Department (HOSPITAL_COMMUNITY): Payer: Medicaid Other

## 2018-05-03 ENCOUNTER — Observation Stay (HOSPITAL_COMMUNITY)
Admission: EM | Admit: 2018-05-03 | Discharge: 2018-05-04 | Disposition: A | Discharge status: Home / Self Care | Payer: Medicaid Other | Attending: Family Medicine | Admitting: Family Medicine

## 2018-05-03 ENCOUNTER — Other Ambulatory Visit: Payer: Self-pay

## 2018-05-03 DIAGNOSIS — F17210 Nicotine dependence, cigarettes, uncomplicated: Secondary | ICD-10-CM | POA: Diagnosis not present

## 2018-05-03 DIAGNOSIS — N390 Urinary tract infection, site not specified: Secondary | ICD-10-CM | POA: Diagnosis not present

## 2018-05-03 DIAGNOSIS — Z794 Long term (current) use of insulin: Secondary | ICD-10-CM

## 2018-05-03 DIAGNOSIS — Z79899 Other long term (current) drug therapy: Secondary | ICD-10-CM | POA: Diagnosis not present

## 2018-05-03 DIAGNOSIS — E1165 Type 2 diabetes mellitus with hyperglycemia: Secondary | ICD-10-CM

## 2018-05-03 DIAGNOSIS — R55 Syncope and collapse: Secondary | ICD-10-CM

## 2018-05-03 DIAGNOSIS — E1065 Type 1 diabetes mellitus with hyperglycemia: Secondary | ICD-10-CM | POA: Diagnosis not present

## 2018-05-03 DIAGNOSIS — A419 Sepsis, unspecified organism: Secondary | ICD-10-CM

## 2018-05-03 HISTORY — DX: Syncope and collapse: R55

## 2018-05-03 LAB — CBC WITH DIFFERENTIAL/PLATELET
ABS IMMATURE GRANULOCYTES: 0.04 10*3/uL (ref 0.00–0.07)
Basophils Absolute: 0 10*3/uL (ref 0.0–0.1)
Basophils Relative: 0 %
Eosinophils Absolute: 0 10*3/uL (ref 0.0–0.5)
Eosinophils Relative: 0 %
HEMATOCRIT: 39.1 % (ref 36.0–46.0)
HEMOGLOBIN: 12.7 g/dL (ref 12.0–15.0)
IMMATURE GRANULOCYTES: 1 %
LYMPHS ABS: 2.2 10*3/uL (ref 0.7–4.0)
Lymphocytes Relative: 27 %
MCH: 28.5 pg (ref 26.0–34.0)
MCHC: 32.5 g/dL (ref 30.0–36.0)
MCV: 87.9 fL (ref 80.0–100.0)
MONO ABS: 0.4 10*3/uL (ref 0.1–1.0)
MONOS PCT: 6 %
NEUTROS ABS: 5.4 10*3/uL (ref 1.7–7.7)
Neutrophils Relative %: 66 %
PLATELETS: 355 10*3/uL (ref 150–400)
RBC: 4.45 MIL/uL (ref 3.87–5.11)
RDW: 13.3 % (ref 11.5–15.5)
WBC: 8.1 10*3/uL (ref 4.0–10.5)
nRBC: 0 % (ref 0.0–0.2)

## 2018-05-03 LAB — I-STAT CG4 LACTIC ACID, ED
LACTIC ACID, VENOUS: 2.06 mmol/L — AB (ref 0.5–1.9)
Lactic Acid, Venous: 5.32 mmol/L (ref 0.5–1.9)

## 2018-05-03 LAB — URINALYSIS, ROUTINE W REFLEX MICROSCOPIC
BILIRUBIN URINE: NEGATIVE
Glucose, UA: >=500 mg/dL — AB
Ketones, ur: 20 mg/dL — AB
NITRITE: POSITIVE — AB
PH: 6 (ref 5.0–8.0)
Protein, ur: NEGATIVE mg/dL
SPECIFIC GRAVITY, URINE: 1.015 (ref 1.005–1.030)

## 2018-05-03 LAB — BASIC METABOLIC PANEL
ANION GAP: 9 (ref 5–15)
BUN: 14 mg/dL (ref 6–20)
CO2: 26 mmol/L (ref 22–32)
Calcium: 9.1 mg/dL (ref 8.9–10.3)
Chloride: 103 mmol/L (ref 98–111)
Creatinine, Ser: 0.87 mg/dL (ref 0.44–1.00)
GFR calc Af Amer: >60 mL/min (ref >60)
GFR calc non Af Amer: >60 mL/min (ref >60)
GLUCOSE: 162 mg/dL — AB (ref 70–99)
Potassium: 4.6 mmol/L (ref 3.5–5.1)
Sodium: 138 mmol/L (ref 135–145)

## 2018-05-03 LAB — CBG MONITORING, ED: Glucose-Capillary: 148 mg/dL — ABNORMAL HIGH (ref 70–99)

## 2018-05-03 LAB — I-STAT BETA HCG BLOOD, ED (MC, WL, AP ONLY)

## 2018-05-03 LAB — I-STAT VENOUS BLOOD GAS, ED
Acid-Base Excess: 6 mmol/L — ABNORMAL HIGH (ref 0.0–2.0)
Bicarbonate: 31.8 mmol/L — ABNORMAL HIGH (ref 20.0–28.0)
O2 Saturation: 26 %
PCO2 VEN: 50.1 mmHg (ref 44.0–60.0)
PH VEN: 7.41 (ref 7.250–7.430)
TCO2: 33 mmol/L — AB (ref 22–32)
pO2, Ven: 18 mmHg — CL (ref 32.0–45.0)

## 2018-05-03 LAB — GLUCOSE, CAPILLARY: Glucose-Capillary: 161 mg/dL — ABNORMAL HIGH (ref 70–99)

## 2018-05-03 MED ORDER — ENOXAPARIN SODIUM 40 MG/0.4ML ~~LOC~~ SOLN
40.0000 mg | SUBCUTANEOUS | Status: DC
  Administered 2018-05-04: 40 mg via SUBCUTANEOUS
  Filled 2018-05-03: qty 0.4

## 2018-05-03 MED ORDER — ACETAMINOPHEN 325 MG PO TABS: 650.0000 mg | ORAL_TABLET | Freq: Four times a day (QID) | ORAL | Status: DC | PRN

## 2018-05-03 MED ORDER — SODIUM CHLORIDE 0.9 % IV BOLUS
1000.0000 mL | Freq: Once | INTRAVENOUS | Status: AC
  Administered 2018-05-03: 1000 mL via INTRAVENOUS

## 2018-05-03 MED ORDER — SODIUM CHLORIDE 0.9 % IV BOLUS
500.0000 mL | Freq: Once | INTRAVENOUS | Status: AC
  Administered 2018-05-03: 500 mL via INTRAVENOUS

## 2018-05-03 MED ORDER — INSULIN ASPART 100 UNIT/ML ~~LOC~~ SOLN: 0.0000 [IU] | Freq: Every day | SUBCUTANEOUS | Status: DC

## 2018-05-03 MED ORDER — ONDANSETRON HCL 4 MG PO TABS: 4.0000 mg | ORAL_TABLET | Freq: Four times a day (QID) | ORAL | Status: DC | PRN

## 2018-05-03 MED ORDER — INSULIN ASPART 100 UNIT/ML ~~LOC~~ SOLN
0.0000 [IU] | Freq: Three times a day (TID) | SUBCUTANEOUS | Status: DC
  Administered 2018-05-04: 7 [IU] via SUBCUTANEOUS
  Administered 2018-05-04: 3 [IU] via SUBCUTANEOUS
  Filled 2018-05-03 (×2): qty 1

## 2018-05-03 MED ORDER — SODIUM CHLORIDE 0.9% FLUSH
3.0000 mL | Freq: Two times a day (BID) | INTRAVENOUS | Status: DC
  Administered 2018-05-04: 3 mL via INTRAVENOUS

## 2018-05-03 MED ORDER — ONDANSETRON HCL 4 MG/2ML IJ SOLN: 4.0000 mg | Freq: Four times a day (QID) | INTRAMUSCULAR | Status: DC | PRN

## 2018-05-03 MED ORDER — LACTATED RINGERS IV SOLN
INTRAVENOUS | Status: DC
  Administered 2018-05-03: 21:00:00 via INTRAVENOUS

## 2018-05-03 MED ORDER — CEPHALEXIN 500 MG PO CAPS
500.0000 mg | ORAL_CAPSULE | Freq: Two times a day (BID) | ORAL | Status: DC
  Administered 2018-05-04 (×2): 500 mg via ORAL
  Filled 2018-05-03 (×2): qty 1

## 2018-05-03 MED ORDER — GLUCOSE BLOOD VI STRP: ORAL_STRIP | 12 refills | Status: DC

## 2018-05-03 MED ORDER — SODIUM CHLORIDE 0.9 % IV SOLN
1.0000 g | Freq: Once | INTRAVENOUS | Status: AC
  Administered 2018-05-03: 1 g via INTRAVENOUS
  Filled 2018-05-03: qty 10

## 2018-05-03 NOTE — ED Notes (Signed)
Lactic acid results given to Dr. Ronnald Nian

## 2018-05-03 NOTE — ED Triage Notes (Signed)
Pt BIB ems for near syncope while walking into [employer], pt felt dizzy and had 3-4 vomiting episodes prior to feeling like she was going to pass out. Hx of diabetes and her CBg was 420 this morning, she took 30 units Lantus and 10 units Novolog and her CBG with ems was 231. Pt given 4mg  zofran and 246ml saline en route. Pt a.o, nad at this time.    BP 96/66 HR100 CBG 231

## 2018-05-03 NOTE — ED Provider Notes (Signed)
[facility] Provider Note   CSN: [id number] Arrival date & time: 05/03/18  1547     History   Chief Complaint Chief Complaint  Patient presents with  . Near Syncope  . Emesis  . Hyperglycemia    HPI Sabrina Sutton is a 22 y.o. female.  The history is provided by the patient.  Near Syncope  This is a new problem. The current episode started 1 to 2 hours ago. The problem has been resolved. Pertinent negatives include no chest pain, no abdominal pain, no headaches and no shortness of breath. Associated symptoms comments: Hx of IDDM, felt warm and flushed while walking around [employer]. Blood sugar elevated with EMS. Compliant with medications. No LOC. Feels better after fluids. Had one episode of emesis. . Nothing aggravates the symptoms. The symptoms are relieved by rest. She has tried nothing for the symptoms. The treatment provided no relief.    Past Medical History:  Diagnosis Date  . Diabetes mellitus without complication ([place]) 7/84/69   + GAD Ab  . DKA (diabetic ketoacidoses) ([place]) 09/20/2015  . Hypokalemia   . Pyelonephritis 04/17/2016    Patient Active Problem List   Diagnosis Date Noted  . Near syncope 05/03/2018  . Type 2 diabetes mellitus with hyperglycemia, with long-term current use of insulin ([place])   . Condyloma acuminatum of vulva 10/31/2017  . 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 Frequent No-show for appointment 11/03/2016  . Type 1 diabetes mellitus without complication ([place]) 62/95/2841    Past Surgical History:  Procedure Laterality Date  . NO PAST SURGERIES       OB History   None      Home Medications    Prior to Admission medications   Medication Sig Start Date End Date Taking? Authorizing Provider  ACCU-CHEK FASTCLIX LANCETS MISC Check sugar 10 x daily 06/15/17  Yes Rogue Bussing, MD  Alcohol Swabs (ALCOHOL PADS) 70 % PADS Use to wipe skin prior to insulin injections twice daily 09/23/15  Yes  Levon Hedger, MD  glucose blood (ACCU-CHEK AVIVA PLUS) test strip Please check blood sugars 4 times a day 05/03/18  Yes Riccio, Gardiner Rhyme, DO  Insulin Glargine (LANTUS SOLOSTAR) 100 UNIT/ML Solostar Pen Inject 30 Units into the skin daily before breakfast. 04/15/18  Yes Riccio, Angela C, DO  Insulin Pen Needle (B-D UF III MINI PEN NEEDLES) 31G X 5 MM MISC CHECK BLOOD SUGAR IN THE MORNING BEFORE EATING, BEFORE EACH MEAL, AND AS NEEDED 06/15/17  Yes Rogue Bussing, MD  Insulin Pen Needle (B-D UF III MINI PEN NEEDLES) 31G X 5 MM MISC USE TO CHECK BLOOD SUGAR IN THE MORNING BEFORE EATING, BEFORE EACH MEAL, AND AS NEEDED 02/08/18  Yes Riccio, Angela C, DO  Lancets (ACCU-CHEK SOFT TOUCH) lancets Use as directed. 10/24/17  Yes Winfrey, Alcario Drought, MD  NOVOLOG FLEXPEN 100 UNIT/ML FlexPen ADMINISTER 10 UNITS UNDER THE SKIN THREE TIMES DAILY BEFORE MEALS Patient taking differently: Inject 10 Units into the skin 3 (three) times daily with meals.  04/15/18  Yes Steve Rattler, DO  acetaminophen (TYLENOL) 325 MG tablet Take 650 mg by mouth every 6 (six) hours as needed for mild pain.     [provider]  acetone, urine, test strip Check ketones per protocol Patient not taking: Reported on 05/03/2018 09/23/15   Levon Hedger, MD  cephALEXin (KEFLEX) 500 MG capsule Take 1 capsule (500 mg total) by mouth 2 (two) times daily. Patient not taking: Reported  on 05/03/2018 04/10/18   Hedges, Dellis Filbert, PA-C  glucagon 1 MG injection Inject 1mg  IM if unconscious, seizing, or unable to eat to correct low blood sugar Patient not taking: Reported on 05/03/2018 09/23/15   Levon Hedger, MD  Insulin Pen Needle (B-D UF III MINI PEN NEEDLES) 31G X 5 MM MISC USE TO CHECK BLOOD SUGAR IN THE MORNING BEFORE EATING, BEFORE EACH MEAL, AND AS NEEDED Patient not taking: Reported on 05/03/2018 02/08/18   Steve Rattler, DO  ondansetron (ZOFRAN-ODT) 4 MG disintegrating tablet Take 1 tablet (4 mg total)  by mouth every 6 (six) hours as needed for nausea or vomiting. Patient not taking: Reported on 04/13/2018 06/15/17   Rogue Bussing, MD    Family History Family History  Problem Relation Age of Onset  . Diabetes Maternal Grandmother   . Heart disease Maternal Grandmother        Deceased from MI at age 48  . Hypertension Maternal Grandmother   . Hypercholesterolemia Mother   . Seizures Mother   . Kidney Stones Mother   . Hyperlipidemia Mother   . Stroke Maternal Grandfather        Deceased from stroke at age 59  . Hypertension Paternal Grandmother   . Healthy Father     Social History Social History   Tobacco Use  . Smoking status: Passive Smoke Exposure - Never Smoker  . Smokeless tobacco: Never Used  Substance Use Topics  . Alcohol use: Yes    Comment: Last drank 1 month ago per MD note  . Drug use: Yes    Types: Marijuana    Comment: Last used 1  month ago per MD note     Allergies   Patient has no known allergies.   Review of Systems Review of Systems  Constitutional: Negative for chills and fever.  HENT: Negative for ear pain and sore throat.   Eyes: Negative for pain and visual disturbance.  Respiratory: Negative for cough and shortness of breath.   Cardiovascular: Positive for near-syncope. Negative for chest pain and palpitations.  Gastrointestinal: Positive for nausea. Negative for abdominal pain and vomiting.  Genitourinary: Negative for dysuria and hematuria.  Musculoskeletal: Negative for arthralgias and back pain.  Skin: Negative for color change and rash.  Neurological: Positive for light-headedness. Negative for dizziness, tremors, seizures, syncope, facial asymmetry, speech difficulty, weakness, numbness and headaches.  All other systems reviewed and are negative.    Physical Exam Updated Vital Signs  ED Triage Vitals  Enc Vitals Group     BP 05/03/18 1556 97/67     Pulse Rate 05/03/18 1556 92     Resp 05/03/18 1556 16     Temp  05/03/18 1556 97.8 F (36.6 C)     Temp Source 05/03/18 1556 Oral     SpO2 05/03/18 1556 99 %     Weight 05/03/18 1555 131 lb (59.4 kg)     Height 05/03/18 1555 5\' 7"  (1.702 m)     Head Circumference --      Peak Flow --      Pain Score 05/03/18 1555 0     Pain Loc --      Pain Edu? --      Excl. in [place]? --     Physical Exam  Constitutional: She is oriented to person, place, and time. She appears well-developed and well-nourished. No distress.  HENT:  Head: Normocephalic and atraumatic.  Eyes: Pupils are equal, round, and reactive to light. Conjunctivae and  EOM are normal.  Neck: Normal range of motion. Neck supple.  Cardiovascular: Normal rate, regular rhythm, normal heart sounds and intact distal pulses.  No murmur heard. Pulmonary/Chest: Effort normal and breath sounds normal. No respiratory distress.  Abdominal: Soft. She exhibits no distension and no mass. There is no tenderness. There is no guarding.  Musculoskeletal: Normal range of motion. She exhibits no edema or deformity.  Neurological: She is alert and oriented to person, place, and time. No cranial nerve deficit or sensory deficit. She exhibits normal muscle tone. Coordination normal.  5+/5 strength, normal sensation, no drift, normal finger to nose finger  Skin: Skin is warm and dry. Capillary refill takes less than 2 seconds.  Psychiatric: She has a normal mood and affect.  Nursing note and vitals reviewed.    ED Treatments / Results  Labs (all labs ordered are listed, but only abnormal results are displayed) Labs Reviewed  BASIC METABOLIC PANEL - Abnormal; Notable for the following components:      Result Value   Glucose, Bld 162 (*)    All other components within normal limits  URINALYSIS, ROUTINE W REFLEX MICROSCOPIC - Abnormal; Notable for the following components:   APPearance CLOUDY (*)    Glucose, UA >=500 (*)    Hgb urine dipstick SMALL (*)    Ketones, ur 20 (*)    Nitrite POSITIVE (*)     Leukocytes, UA LARGE (*)    WBC, UA >50 (*)    Bacteria, UA FEW (*)    All other components within normal limits  GLUCOSE, CAPILLARY - Abnormal; Notable for the following components:   Glucose-Capillary 161 (*)    All other components within normal limits  CBG MONITORING, ED - Abnormal; Notable for the following components:   Glucose-Capillary 148 (*)    All other components within normal limits  I-STAT VENOUS BLOOD GAS, ED - Abnormal; Notable for the following components:   pO2, Ven 18.0 (*)    Bicarbonate 31.8 (*)    TCO2 33 (*)    Acid-Base Excess 6.0 (*)    All other components within normal limits  I-STAT CG4 LACTIC ACID, ED - Abnormal; Notable for the following components:   Lactic Acid, Venous 5.32 (*)    All other components within normal limits  I-STAT CG4 LACTIC ACID, ED - Abnormal; Notable for the following components:   Lactic Acid, Venous 2.06 (*)    All other components within normal limits  URINE CULTURE  CULTURE, BLOOD (ROUTINE X 2)  CULTURE, BLOOD (ROUTINE X 2)  CBC WITH DIFFERENTIAL/PLATELET  HEMOGLOBIN A1C  TSH  LIPID PANEL  I-STAT BETA HCG BLOOD, ED ([facility], [facility], [facility] ONLY)    EKG EKG Interpretation  Date/Time:  Friday May 03 2018 15:59:43 EDT Ventricular Rate:  89 PR Interval:    QRS Duration: 79 QT Interval:  349 QTC Calculation: 425 R Axis:   62 Text Interpretation:  Sinus rhythm Confirmed by Lennice Sites [phone]) on 05/03/2018 4:09:26 PM   Radiology Dg Chest Portable 1 View  Result Date: 05/03/2018 CLINICAL DATA:  Patient with dizziness. EXAM: PORTABLE CHEST 1 VIEW COMPARISON:  Chest radiograph 06/13/2017 FINDINGS: Monitoring leads overlie the patient. Normal cardiac and mediastinal contours. No consolidative pulmonary opacities. No pleural effusion or pneumothorax. IMPRESSION: No acute cardiopulmonary process. Electronically Signed   By: Lovey Newcomer M.D.   On: 05/03/2018 18:29    Procedures .Critical Care Performed by: Lennice Sites,  DO Authorized by: Lennice Sites, DO   Critical care provider  statement:    Critical care time (minutes):  35   Critical care time was exclusive of:  Separately billable procedures and treating other patients and teaching time   Critical care was necessary to treat or prevent imminent or life-threatening deterioration of the following conditions:  Sepsis   Critical care was time spent personally by me on the following activities:  Development of treatment plan with patient or surrogate, discussions with primary provider, evaluation of patient's response to treatment, examination of patient, obtaining history from patient or surrogate, ordering and performing treatments and interventions, ordering and review of laboratory studies, ordering and review of radiographic studies, pulse oximetry, re-evaluation of patient's condition and review of old charts   I assumed direction of critical care for this patient from another provider in my specialty: no     (including critical care time)  Medications Ordered in ED Medications  lactated ringers infusion ( Intravenous New Bag/Given 05/03/18 2100)  acetaminophen (TYLENOL) tablet 650 mg (has no administration in time range)  cephALEXin (KEFLEX) capsule 500 mg (has no administration in time range)  sodium chloride flush (NS) 0.9 % injection 3 mL (has no administration in time range)  enoxaparin (LOVENOX) injection 40 mg (has no administration in time range)  ondansetron (ZOFRAN) tablet 4 mg (has no administration in time range)    Or  ondansetron (ZOFRAN) injection 4 mg (has no administration in time range)  insulin aspart (novoLOG) injection 0-9 Units (has no administration in time range)  insulin aspart (novoLOG) injection 0-5 Units (0 Units Subcutaneous Not Given 05/03/18 2352)  sodium chloride 0.9 % bolus 1,000 mL (0 mLs Intravenous Stopped 05/03/18 1735)  sodium chloride 0.9 % bolus 500 mL (0 mLs Intravenous Stopped 05/03/18 1818)  sodium chloride  0.9 % bolus 500 mL (0 mLs Intravenous Stopped 05/03/18 2013)  sodium chloride 0.9 % bolus 1,000 mL (0 mLs Intravenous Stopped 05/03/18 1922)  cefTRIAXone (ROCEPHIN) 1 g in sodium chloride 0.9 % 100 mL IVPB (0 g Intravenous Stopped 05/03/18 2216)     Initial Impression / Assessment and Plan / ED Course  I have reviewed the triage vital signs and the nursing notes.  Pertinent labs & imaging results that were available during my care of the patient were reviewed by me and considered in my medical decision making (see chart for details).     Sabrina Sutton is a 22 year old female with history of diabetes who presents to the ED after syncopal event.  Patient with low blood pressure upon arrival but otherwise not tachycardic, no fever.  Patient was feeling fairly well today but while walking around [employer] got lightheaded and dizzy and almost passed out.  She denies any chest pain, shortness of breath, abdominal pain.  Has had some nausea, vomiting.  Denies any urinary symptoms.  Patient with normal neurological exam.  Overall well-appearing.  No abdominal tenderness.  Had some intermittently low blood pressures in the 33A systolic but was difficult to assess as believed was secondary to large blood pressure cuff. Improved with change in BP cuff. However infectious work-up was initiated when her lactic acid came back at 5.3.  However mostly concerned that patient may just be volume depleted from poor diabetic control.  She admits to poor p.o. intake over the last several days.  Patient was given 30 cc/kg of fluids.    Chest x-ray showed no signs of pneumonia, no pneumothorax, no pleural effusion.  Patient with no significant leukocytosis.  No significant anemia or electrolyte abnormality.  Lab  work is not consistent with DKA.  Urinalysis concerning for urinary tract infection and thus patient was started on IV ceftriaxone for concern for sepsis from urinary tract infection.  Blood pressure stabilized  following fluids and lactic acid improved to 2.06 on repeat.  Patient mentating well.  No concern for septic shock.  Given her risk factors with diabetes she was admitted to family medicine for further antibiotics and hydration.  This chart was dictated using voice recognition software.  Despite best efforts to proofread,  errors can occur which can change the documentation meaning.   Final Clinical Impressions(s) / ED Diagnoses   Final diagnoses:  Sepsis, due to unspecified organism, unspecified whether acute organ dysfunction present [facility])  Urinary tract infection without hematuria, site unspecified    ED Discharge Orders    None       Lennice Sites, DO 05/04/18 [id number]

## 2018-05-03 NOTE — ED Notes (Signed)
Patient ambulated to bathroom without assistance, stead gait noted. Denies dizziness.

## 2018-05-03 NOTE — ED Notes (Signed)
Patient given turkey sandwich and PO fluids.  

## 2018-05-03 NOTE — H&P (Addendum)
[facility] Admission History and Physical Service Pager: [phone]  Patient name: Sabrina Sutton Medical record number: [id number] Date of birth: [birth year] Age: 22 y.o. Gender: female  Primary Care Provider: Steve Rattler, DO Consultants: None Code Status: Full  Chief Complaint: vomiting, near syncope, hyperglycemia  Assessment and Plan: Sabrina Sutton is a 22 y.o. female presenting with near syncope, emesis, hyperglycemia . PMH is significant for T1 IDDM.  Near syncope, emesis  Vomited multiple times today and felt very dizzy and close to passing out.  CBG in 200s by EMS.  EKG NSR on presentation to ED.  Afebrile with WBC WNL.  Lactic acid elevated to 5.32, corrected to 2.06 with 1.5L NS IV fluids.  BP hypotensive to 88/66 at presentation, but cuff was too large and when was changed, was WNL, per ED provider's report.  Tachypneia noted to 25, that improved to WNL while in ED.  Negative orthostatic vitals in ED.  Patient very non-toxic appearing and states "I feel completely fine now."  Considering her one recorded respiratory rate of 25 and SBP <100, patient has qSOFA score of 2, but does not meet SIRS criteria.  UA positive for large leukocytes, positive nitrites, 20 ketones, >500 glucose, small Hgb.  S/P 1g rocephin in ED.  Suspect UTI could be contributing or patient could be starting to develop early DKA.  We did consider arrythmia but total clinical picture doesn't fit that as likely etiology.  PE also unlikely given stable respiratory status and improvement s/p fluids. - Admit to telemetry, Dr. Lowella Bandy service - monitor vitals per floor protocol - continuous pulse ox - cardiac monitoring  - keflex 500 mg BID x 6 days (10/11- ) - sSSI with HS coverage - CBGs AC/HS - CBC, BMP in AM - LR @ 100cc/hr - lipid panel - TSH - zofran prn nausea - tylenol prn pain - lovenox DVT PPx - carb modified diet  Hyperglycemia, Type 1 IDDM Last A1c 13.4.   Notes CBG 400s this AM.  States that it is usually 200-220s in the AM.  When EMS arrived, the noted her to be in 200s.  20 ketones and >500 glucose on UA.  CBG 162 on admission with normal anion gap. - plan per above  UTI UA positive for large leukocytes, positive nitrites, 20 ketones, >500 glucose, small Hgb.  No dysuria complaints.  Notes a UTI 1 month ago for which she received a course of Keflex that resolved.  - keflex 500mg  BID - f/u Urine Cx  Tobacco abuse: ~67yr, 3-4 cigarrettes per day. Patient declines nicotine patch and states she would like to quit -will encourage cessation and offer cessation meds on d/c   FEN/GI: Carb modified Prophylaxis: Lovenox  Disposition: admit to telemetry, likely d/c 10/12  History of Present Illness:  Sabrina Sutton is a 22 y.o. female presenting with emesis, near syncope, and hyperglycemia.  She states that she woke up this morning was in her usual state of health.  Her blood glucose was in the 400s upon waking around 11 AM.  She states that she left her house around 2 PM and stopped at a gas station where she got very dizzy and threw up 3-4 times (NBNB).  She then went to [facility] where she was dropped off and proceeded to vomit 2 more times.  She states at that time she felt very dizzy and felt as though she was going to pass out but did not.  She states that she sat  down into random women called the ambulance for her.  She states that when the ambulance arrived EMS checked her blood glucose and it was in the 200s.  Prior to today she has been feeling fine and without any symptoms.  She denies any abdominal pain, diarrhea, constipation.  She denies any dysuria, hematuria.  Denies any vaginal itching, burning.  In the ED, patient received 1.5 L normal saline bolus.  She was also found to have a urinary tract infection and was given 1 g of Rocephin.  At the time of examination patient was without complaints and was eating a [place] sandwich.   She  notes that she had a UTI 1 month ago and was treated with a course of Keflex.  She states that she took the entire course of Keflex.  She states that at the time she was not having any dysuria, and continues to deny dysuria.   Review Of Systems: Per HPI with the following additions:   Review of Systems  Constitutional: Negative for chills and fever.  HENT: Negative for congestion.   Eyes: Positive for blurred vision (while vomiting, resolved).  Respiratory: Negative for cough and shortness of breath.   Cardiovascular: Negative for chest pain.  Gastrointestinal: Positive for nausea and vomiting. Negative for abdominal pain, constipation and diarrhea.  Genitourinary: Negative for dysuria and hematuria.  Neurological: Positive for dizziness. Negative for loss of consciousness and headaches.    Patient Active Problem List   Diagnosis Date Noted  . Condyloma acuminatum of vulva 10/31/2017  . 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 Frequent No-show for appointment 11/03/2016  . Type 1 diabetes mellitus without complication ([place]) 32/44/0102    Past Medical History: Past Medical History:  Diagnosis Date  . Diabetes mellitus without complication ([place]) 02/15/35   + GAD Ab  . DKA (diabetic ketoacidoses) ([place]) 09/20/2015  . Hypokalemia   . Pyelonephritis 04/17/2016    Past Surgical History: Past Surgical History:  Procedure Laterality Date  . NO PAST SURGERIES      Social History: Social History   Tobacco Use  . Smoking status: Passive Smoke Exposure - Never Smoker  . Smokeless tobacco: Never Used  Substance Use Topics  . Alcohol use: Yes    Comment: Last drank 1 month ago per MD note  . Drug use: Yes    Types: Marijuana    Comment: Last used 1  month ago per MD note   Additional social history: Smokes 3-4 cigarettes a day  Please also refer to relevant sections of EMR.  Family History: Family History  Problem Relation Age of Onset  . Diabetes Maternal Grandmother   . Heart disease  Maternal Grandmother        Deceased from MI at age 20  . Hypertension Maternal Grandmother   . Hypercholesterolemia Mother   . Seizures Mother   . Kidney Stones Mother   . Hyperlipidemia Mother   . Stroke Maternal Grandfather        Deceased from stroke at age 18  . Hypertension Paternal Grandmother   . Healthy Father     Allergies and Medications: No Known Allergies No current facility-administered medications on file prior to encounter.    Current Outpatient Medications on File Prior to Encounter  Medication Sig Dispense Refill  . ACCU-CHEK FASTCLIX LANCETS MISC Check sugar 10 x daily 304 each 3  . Alcohol Swabs (ALCOHOL PADS) 70 % PADS Use to wipe skin prior to insulin injections twice daily 200 each 6  . glucose  blood (ACCU-CHEK AVIVA PLUS) test strip Please check blood sugars 4 times a day 300 each 12  . Insulin Glargine (LANTUS SOLOSTAR) 100 UNIT/ML Solostar Pen Inject 30 Units into the skin daily before breakfast. 15 mL 0  . Insulin Pen Needle (B-D UF III MINI PEN NEEDLES) 31G X 5 MM MISC CHECK BLOOD SUGAR IN THE MORNING BEFORE EATING, BEFORE EACH MEAL, AND AS NEEDED 100 each 2  . Insulin Pen Needle (B-D UF III MINI PEN NEEDLES) 31G X 5 MM MISC USE TO CHECK BLOOD SUGAR IN THE MORNING BEFORE EATING, BEFORE EACH MEAL, AND AS NEEDED 100 each 0  . Lancets (ACCU-CHEK SOFT TOUCH) lancets Use as directed. 100 each 5  . NOVOLOG FLEXPEN 100 UNIT/ML FlexPen ADMINISTER 10 UNITS UNDER THE SKIN THREE TIMES DAILY BEFORE MEALS (Patient taking differently: Inject 10 Units into the skin 3 (three) times daily with meals. ) 15 mL 0  . acetaminophen (TYLENOL) 325 MG tablet Take 650 mg by mouth every 6 (six) hours as needed for mild pain.     Marland Kitchen acetone, urine, test strip Check ketones per protocol (Patient not taking: Reported on 05/03/2018) 50 each 3  . cephALEXin (KEFLEX) 500 MG capsule Take 1 capsule (500 mg total) by mouth 2 (two) times daily. (Patient not taking: Reported on 05/03/2018) 10  capsule 0  . glucagon 1 MG injection Inject 1mg  IM if unconscious, seizing, or unable to eat to correct low blood sugar (Patient not taking: Reported on 05/03/2018) 2 each 2  . Insulin Pen Needle (B-D UF III MINI PEN NEEDLES) 31G X 5 MM MISC USE TO CHECK BLOOD SUGAR IN THE MORNING BEFORE EATING, BEFORE EACH MEAL, AND AS NEEDED (Patient not taking: Reported on 05/03/2018) 100 each 2  . ondansetron (ZOFRAN-ODT) 4 MG disintegrating tablet Take 1 tablet (4 mg total) by mouth every 6 (six) hours as needed for nausea or vomiting. (Patient not taking: Reported on 04/13/2018) 20 tablet 0    Objective: BP (!) 92/58   Pulse 73   Temp 97.8 F (36.6 C) (Oral)   Resp 12   Ht 5\' 7"  (1.702 m)   Wt 59.4 kg   LMP 04/01/2018 (Approximate)   SpO2 100%   BMI 20.52 kg/m   Physical Exam: General: 22 y.o. female in NAD, sitting up, eating a [place] sandwich Cardio: RRR no m/r/g Lungs: CTAB, no wheezing, no rhonchi, no crackles Abdomen: Soft, non-tender to palpation, positive bowel sounds Skin: warm and dry Extremities: No edema   Labs and Imaging: CBC BMET  Recent Labs  Lab 05/03/18 1615  WBC 8.1  HGB 12.7  HCT 39.1  PLT 355   Recent Labs  Lab 05/03/18 1615  NA 138  K 4.6  CL 103  CO2 26  BUN 14  CREATININE 0.87  GLUCOSE 162*  CALCIUM 9.1     Lactic Acid, Venous    Component Value Date/Time   LATICACIDVEN 2.06 (HH) 05/03/2018 1942    Urinalysis    Component Value Date/Time   COLORURINE YELLOW 05/03/2018 1928   APPEARANCEUR CLOUDY (A) 05/03/2018 1928   LABSPEC 1.015 05/03/2018 1928   PHURINE 6.0 05/03/2018 1928   GLUCOSEU >=500 (A) 05/03/2018 1928   HGBUR SMALL (A) 05/03/2018 1928   BILIRUBINUR NEGATIVE 05/03/2018 1928   KETONESUR 20 (A) 05/03/2018 1928   PROTEINUR NEGATIVE 05/03/2018 1928   UROBILINOGEN 1.0 08/21/2014 1549   NITRITE POSITIVE (A) 05/03/2018 1928   LEUKOCYTESUR LARGE (A) 05/03/2018 1928     Dg Chest Portable  1 View  Result Date: 05/03/2018 CLINICAL  DATA:  Patient with dizziness. EXAM: PORTABLE CHEST 1 VIEW COMPARISON:  Chest radiograph 06/13/2017 FINDINGS: Monitoring leads overlie the patient. Normal cardiac and mediastinal contours. No consolidative pulmonary opacities. No pleural effusion or pneumothorax. IMPRESSION: No acute cardiopulmonary process. Electronically Signed   By: Lovey Newcomer M.D.   On: 05/03/2018 18:29     [place], Bernita Raisin, DO 05/03/2018, 10:28 PM PGY-1, [place] Intern pager: [phone], text pages welcome  FPTS Upper-Level Resident Addendum   I have independently interviewed and examined the patient. I have discussed the above with the original author and agree with their documentation. My edits for correction/addition/clarification are in blue. Please see also any attending notes.    Sherene Sires, DO PGY-2, [facility] 05/04/2018 4:12 AM  FPTS Service pager: [phone] (text pages welcome through [facility])

## 2018-05-04 DIAGNOSIS — N390 Urinary tract infection, site not specified: Secondary | ICD-10-CM

## 2018-05-04 DIAGNOSIS — A419 Sepsis, unspecified organism: Secondary | ICD-10-CM | POA: Diagnosis not present

## 2018-05-04 DIAGNOSIS — R55 Syncope and collapse: Secondary | ICD-10-CM | POA: Diagnosis not present

## 2018-05-04 LAB — CBC
HEMATOCRIT: 36.1 % (ref 36.0–46.0)
Hemoglobin: 12.1 g/dL (ref 12.0–15.0)
MCH: 29.1 pg (ref 26.0–34.0)
MCHC: 33.5 g/dL (ref 30.0–36.0)
MCV: 86.8 fL (ref 80.0–100.0)
PLATELETS: 300 10*3/uL (ref 150–400)
RBC: 4.16 MIL/uL (ref 3.87–5.11)
RDW: 13.4 % (ref 11.5–15.5)
WBC: 10.9 10*3/uL — ABNORMAL HIGH (ref 4.0–10.5)
nRBC: 0 % (ref 0.0–0.2)

## 2018-05-04 LAB — LIPID PANEL
CHOL/HDL RATIO: 3.2 ratio
CHOLESTEROL: 180 mg/dL (ref 0–200)
HDL: 57 mg/dL (ref >40)
LDL Cholesterol: 99 mg/dL (ref 0–99)
TRIGLYCERIDES: 120 mg/dL (ref <150)
VLDL: 24 mg/dL (ref 0–40)

## 2018-05-04 LAB — GLUCOSE, CAPILLARY
GLUCOSE-CAPILLARY: 340 mg/dL — AB (ref 70–99)
Glucose-Capillary: 219 mg/dL — ABNORMAL HIGH (ref 70–99)

## 2018-05-04 LAB — HEMOGLOBIN A1C
Hgb A1c MFr Bld: 12.8 % — ABNORMAL HIGH (ref 4.8–5.6)
Mean Plasma Glucose: 320.66 mg/dL

## 2018-05-04 LAB — BASIC METABOLIC PANEL
ANION GAP: 9 (ref 5–15)
BUN: 6 mg/dL (ref 6–20)
CO2: 23 mmol/L (ref 22–32)
Calcium: 8.6 mg/dL — ABNORMAL LOW (ref 8.9–10.3)
Chloride: 102 mmol/L (ref 98–111)
Creatinine, Ser: 0.66 mg/dL (ref 0.44–1.00)
GFR calc Af Amer: >60 mL/min (ref >60)
GLUCOSE: 270 mg/dL — AB (ref 70–99)
POTASSIUM: 3.4 mmol/L — AB (ref 3.5–5.1)
Sodium: 134 mmol/L — ABNORMAL LOW (ref 135–145)

## 2018-05-04 LAB — TSH: TSH: 0.586 u[IU]/mL (ref 0.350–4.500)

## 2018-05-04 LAB — LACTIC ACID, PLASMA: Lactic Acid, Venous: 2.2 mmol/L (ref 0.5–1.9)

## 2018-05-04 MED ORDER — CEPHALEXIN 500 MG PO CAPS: 500.0000 mg | ORAL_CAPSULE | Freq: Two times a day (BID) | ORAL | 0 refills | Status: AC

## 2018-05-04 NOTE — Discharge Instructions (Signed)
Dear Sabrina Sutton,   Thank you for letting us participate in your care! In this section, you will find a brief hospital admission summary of why you were admitted to the hospital, what happened, and any further follow up we think would benefit you and your health:   You were admitted because you were experiencing vomiting and high blood sugar. You were found to have a UTI. We started you on an antibiotic. It will be important for you to finish the entire course of your antibiotic. You can pick this up at your local pharmacy.    You were also found to have high blood sugar. This is likely secondary to your UTI. Please finish your antibiotics as above and start taking your home Lantus and Novolog.   Please follow-up at [facility] on 05/13/18 to see how you have improved and have your diabetes re-evaluated.  POST-HOSPITAL/FOLLOW-UP CARE INSTRUCTIONS Home instructions:  1. Please finish Keflex for 4 more days. Take your next dose tonight and twice a day there after. 2. Please follow up at [facility] on 05/13/2018 3. Please restart your home diabetes medicine  Doctors appointments & follow up:  Future Appointments  Date Time Provider [place]  05/13/2018  1:45 PM Benay Pike, MD [facility]  05/17/2018  8:00 AM Bernarda Caffey, MD [facility] None  06/03/2018  9:45 AM Leavy Cella, [facility] [place]   Thank you for choosing [facility]! Take care and be well!  [facility]  [place], [place] [address] [phone]

## 2018-05-04 NOTE — Progress Notes (Signed)
Family Medicine Teaching Service Daily Progress Note Intern Pager: [phone]  Patient name: Sabrina Sutton Medical record number: [id number] Date of birth: 1996/04/15 Age: 22 y.o. Gender: female  Primary Care Provider: Steve Rattler, DO Consultants: None Code Status: Full  Pt Overview and Major Events to Date:  10/11 Admitted to FPTS  Assessment and Plan: Ladesha Pacini is a 22 y.o. female presenting with near syncope, emesis, hyperglycemia . PMH is significant for T1 IDDM.  Near syncope, emesis  Likely 2/2 UTI vs uncontrolled DM.  VSS this AM.  Lactic Acid pending this AM. Patient feeling well without complaints, currently at her baseline.  Has not eaten yet this AM, but tolerated PO last PM. - keflex 500 mg BID x 6 days (10/11- ) - sSSI with HS coverage - CBGs AC/HS - CBC, BMP in AM - LR @ 100cc/hr - zofran prn nausea - tylenol prn pain  Hyperglycemia: Resolved, Type 1 IDDM A1c 12.8.  TSH 0.586.  LDL 99, TG 120, HDL 57.  Blood glucose 270 this AM. - sSSI with HS coverage - CBGs AC/HS  UTI Continues to deny urinary complaints. - cont keflex 500mg  BID x 6 days (10/11- ) - f/u Urine Cx  Tobacco abuse Smokes 3-4 cigarettes a day for 1 year. - declined nicotine patch  FEN/GI: carb modified PPx: lovenox  Disposition: likely home today  Subjective:  Patient states that she is feeling well this AM.  She denies any complaints and states that she is "back to normal."  Objective: Temp:  [97.8 F (36.6 C)-98.7 F (37.1 C)] 98 F (36.7 C) (10/12 0626) Pulse Rate:  [73-106] 81 (10/12 0626) Resp:  [12-25] 15 (10/12 0626) BP: (82-114)/(54-78) 109/71 (10/12 0626) SpO2:  [98 %-100 %] 100 % (10/12 0626) Weight:  [59.4 kg] 59.4 kg (10/12 0441)  Physical Exam:  General: 21 y.o. female in NAD Cardio: RRR no m/r/g Lungs: CTAB, no wheezing, no rhonchi, no crackles Abdomen: Soft, non-tender to palpation, positive bowel sounds Skin: warm and dry Extremities: No  edema   Laboratory: Recent Labs  Lab 05/03/18 1615 05/04/18 0410  WBC 8.1 10.9*  HGB 12.7 12.1  HCT 39.1 36.1  PLT 355 300   Recent Labs  Lab 05/03/18 1615 05/04/18 0410  NA 138 134*  K 4.6 3.4*  CL 103 102  CO2 26 23  BUN 14 6  CREATININE 0.87 0.66  CALCIUM 9.1 8.6*  GLUCOSE 162* 270*   CBG (last 3)  Recent Labs    05/03/18 1622 05/03/18 2330 05/04/18 0620  GLUCAP 148* 161* 219*     Imaging/Diagnostic Tests: Dg Chest Portable 1 View  Result Date: 05/03/2018 CLINICAL DATA:  Patient with dizziness. EXAM: PORTABLE CHEST 1 VIEW COMPARISON:  Chest radiograph 06/13/2017 FINDINGS: Monitoring leads overlie the patient. Normal cardiac and mediastinal contours. No consolidative pulmonary opacities. No pleural effusion or pneumothorax. IMPRESSION: No acute cardiopulmonary process. Electronically Signed   By: Lovey Newcomer M.D.   On: 05/03/2018 18:29    [place]cc[place], Bernita Raisin, DO 05/04/2018, 7:00 AM PGY-1, [place] Intern pager: [phone], text pages welcome

## 2018-05-05 NOTE — Discharge Summary (Signed)
[facility] Discharge Summary  Patient name: Sabrina Sutton Medical record number: [id number] Date of birth: 1996/04/14 Age: 22 y.o. Gender: female Date of Admission: 05/03/2018  Date of Discharge: 05/04/2018 Admitting Physician: Zenia Resides, MD  Primary Care Provider: Steve Rattler, DO Consultants: None  Indication for Hospitalization: near syncope, emesis, hyperglycemia with elevated Lactic acid  Discharge Diagnoses/Problem List:  Near syncope, emesis Hyperglycemia UTI Tobacco abuse  Disposition: home  Discharge Condition: stable  Discharge Exam:   Physical Exam:  General: 22 y.o. female in NAD Cardio: RRR no m/r/g Lungs: CTAB, no wheezing, no rhonchi, no crackles Abdomen: Soft, non-tender to palpation, positive bowel sounds Skin: warm and dry Extremities: No edema  Brief Hospital Course:  Sabrina McCellonis a 22 y.o.who presented with near syncope, emesis, hyperglycemia. PMH is significant for T1 IDDM.  Her hospital course is outlined below.  For admission details please see H&P.  Lactic acid was elevated on admission, although patient not septic appearing and vitals, once taken appropriately, did not meet sepsis criteria.  LA improved with fluid resuscitation.  UTI that was found on admission was treated with Keflex and patient will complete the rest of her treatment as an outpatient.  She also felt much improved and vomiting had resolved by the time she was discharged.  Hyperglycemia has resolved by the time the patient was admitted to the hospital and her BS remained well controlled while she was inpatient.  Her A1c was 12.8, however, and will likely require close outpatient management of her diabetes.  The near syncope workup including EKG and orthostatic vitals were negative.  Given that this was in the setting of vomiting, it was likely caused by her illness/emesis.  It is suspected that her UTI caused her to become ill and  likely, in conjunction with her uncontrolled DM, tip her towards developing DKA.  She was likely admitted in the early stages and therefore was not in DKA at the time of admission.  Her vital signs were stable and she was without any complaints at the time of discharge.  Issues for Follow Up:  1. She will need to complete a course of Keflex for her UTI, ending 10/16 2. She will need continued management of her diabetes, given her A1c of 12.8  Significant Procedures: None  Significant Labs and Imaging:  Recent Labs  Lab 05/03/18 1615 05/04/18 0410  WBC 8.1 10.9*  HGB 12.7 12.1  HCT 39.1 36.1  PLT 355 300   Recent Labs  Lab 05/03/18 1615 05/04/18 0410  NA 138 134*  K 4.6 3.4*  CL 103 102  CO2 26 23  GLUCOSE 162* 270*  BUN 14 6  CREATININE 0.87 0.66  CALCIUM 9.1 8.6*    CBG (last 3)  Recent Labs    05/03/18 2330 05/04/18 0620 05/04/18 1132  GLUCAP 161* 219* 340*    Results/Tests Pending at Time of Discharge: Urine culture  Discharge Medications:  Allergies as of 05/04/2018   No Known Allergies     Medication List    STOP taking these medications   acetaminophen 325 MG tablet Commonly known as:  TYLENOL     TAKE these medications   ACCU-CHEK FASTCLIX LANCETS Misc Check sugar 10 x daily   accu-chek soft touch lancets Use as directed.   acetone (urine) test strip Check ketones per protocol   Alcohol Pads 70 % Pads Use to wipe skin prior to insulin injections twice daily   cephALEXin 500 MG capsule Commonly  known as:  KEFLEX Take 1 capsule (500 mg total) by mouth 2 (two) times daily for 4 days.   glucagon 1 MG injection Inject 1mg  IM if unconscious, seizing, or unable to eat to correct low blood sugar   glucose blood test strip Please check blood sugars 4 times a day   Insulin Glargine 100 UNIT/ML Solostar Pen Commonly known as:  LANTUS Inject 30 Units into the skin daily before breakfast.   Insulin Pen Needle 31G X 5 MM Misc CHECK BLOOD  SUGAR IN THE MORNING BEFORE EATING, BEFORE EACH MEAL, AND AS NEEDED What changed:  Another medication with the same name was removed. Continue taking this medication, and follow the directions you see here.   Insulin Pen Needle 31G X 5 MM Misc USE TO CHECK BLOOD SUGAR IN THE MORNING BEFORE EATING, BEFORE EACH MEAL, AND AS NEEDED What changed:  Another medication with the same name was removed. Continue taking this medication, and follow the directions you see here.   NOVOLOG FLEXPEN 100 UNIT/ML FlexPen Generic drug:  insulin aspart ADMINISTER 10 UNITS UNDER THE SKIN THREE TIMES DAILY BEFORE MEALS What changed:  See the new instructions.   ondansetron 4 MG disintegrating tablet Commonly known as:  ZOFRAN-ODT Take 1 tablet (4 mg total) by mouth every 6 (six) hours as needed for nausea or vomiting.       Discharge Instructions: Please refer to Patient Instructions section of EMR for full details.  Patient was counseled important signs and symptoms that should prompt return to medical care, changes in medications, dietary instructions, activity restrictions, and follow up appointments.   Follow-Up Appointments: Follow-up Information    [place]. Go on 05/13/2018.   Why:  Go to appointment at 1:45 PM. Please bring medications and arrive 15 minutes early. Contact information: [address] [place]          Cleophas Dunker, DO 05/05/2018, 8:46 PM PGY-1, [place]

## 2018-05-06 LAB — URINE CULTURE

## 2018-05-08 LAB — CULTURE, BLOOD (ROUTINE X 2)
CULTURE: NO GROWTH
Culture: NO GROWTH
SPECIAL REQUESTS: ADEQUATE

## 2018-05-13 ENCOUNTER — Ambulatory Visit: Payer: Medicaid Other | Admitting: Family Medicine

## 2018-05-14 ENCOUNTER — Telehealth: Payer: Self-pay | Admitting: Family Medicine

## 2018-05-14 NOTE — Telephone Encounter (Signed)
Faxed

## 2018-05-14 NOTE — Telephone Encounter (Signed)
Derrick from [employer] is calling and would like to have the notes from the pt's appointment concerning having a diabetic eye exam. Dr. Vanetta Shawl referred her to Dr. Coralyn Pear. They would like to have the notes faxed to [phone]. Her next appointment with them is 05/17/18.

## 2018-05-16 ENCOUNTER — Ambulatory Visit: Payer: Medicaid Other | Admitting: Pharmacist

## 2018-05-17 ENCOUNTER — Encounter (INDEPENDENT_AMBULATORY_CARE_PROVIDER_SITE_OTHER): Payer: Medicaid Other | Admitting: Ophthalmology

## 2018-06-03 ENCOUNTER — Ambulatory Visit: Payer: Medicaid Other | Admitting: Pharmacist

## 2018-06-18 ENCOUNTER — Other Ambulatory Visit: Payer: Self-pay | Admitting: Internal Medicine

## 2018-06-19 ENCOUNTER — Telehealth: Payer: Self-pay | Admitting: Family Medicine

## 2018-06-19 NOTE — Telephone Encounter (Signed)
According to [facility] , patient is overusing the test strips and [facility] needs to know the maximum amount of times per day she can test.  Please call to let them know, at [phone]

## 2018-06-25 NOTE — Telephone Encounter (Signed)
Spoke with pharmacist at [facility] and informed him of appropriate use for test strips.   He updated the prescription.

## 2018-06-25 NOTE — Telephone Encounter (Signed)
Please let pharmacy know she can test 5 times a day- fasting, 3 times with meals, once before bed.

## 2018-07-27 ENCOUNTER — Other Ambulatory Visit: Payer: Self-pay | Admitting: Family Medicine

## 2018-08-14 ENCOUNTER — Other Ambulatory Visit: Payer: Self-pay | Admitting: Family Medicine

## 2018-08-21 ENCOUNTER — Encounter (HOSPITAL_COMMUNITY): Payer: Self-pay | Admitting: Emergency Medicine

## 2018-08-21 ENCOUNTER — Emergency Department (HOSPITAL_COMMUNITY): Payer: Medicaid Other

## 2018-08-21 ENCOUNTER — Inpatient Hospital Stay (HOSPITAL_COMMUNITY)
Admission: EM | Admit: 2018-08-21 | Discharge: 2018-08-24 | DRG: 871 | Disposition: A | Discharge status: Home / Self Care | Payer: Medicaid Other | Attending: Internal Medicine | Admitting: Internal Medicine

## 2018-08-21 ENCOUNTER — Other Ambulatory Visit: Payer: Self-pay

## 2018-08-21 DIAGNOSIS — E109 Type 1 diabetes mellitus without complications: Secondary | ICD-10-CM

## 2018-08-21 DIAGNOSIS — E1065 Type 1 diabetes mellitus with hyperglycemia: Secondary | ICD-10-CM

## 2018-08-21 DIAGNOSIS — Z8249 Family history of ischemic heart disease and other diseases of the circulatory system: Secondary | ICD-10-CM

## 2018-08-21 DIAGNOSIS — Z9114 Patient's other noncompliance with medication regimen: Secondary | ICD-10-CM | POA: Diagnosis not present

## 2018-08-21 DIAGNOSIS — Z794 Long term (current) use of insulin: Secondary | ICD-10-CM

## 2018-08-21 DIAGNOSIS — Z91128 Patient's intentional underdosing of medication regimen for other reason: Secondary | ICD-10-CM

## 2018-08-21 DIAGNOSIS — Z823 Family history of stroke: Secondary | ICD-10-CM

## 2018-08-21 DIAGNOSIS — Z833 Family history of diabetes mellitus: Secondary | ICD-10-CM | POA: Diagnosis not present

## 2018-08-21 DIAGNOSIS — D49511 Neoplasm of unspecified behavior of right kidney: Secondary | ICD-10-CM | POA: Diagnosis present

## 2018-08-21 DIAGNOSIS — R000 Tachycardia, unspecified: Secondary | ICD-10-CM

## 2018-08-21 DIAGNOSIS — Z87440 Personal history of urinary (tract) infections: Secondary | ICD-10-CM | POA: Diagnosis not present

## 2018-08-21 DIAGNOSIS — N12 Tubulo-interstitial nephritis, not specified as acute or chronic: Secondary | ICD-10-CM | POA: Diagnosis present

## 2018-08-21 DIAGNOSIS — A419 Sepsis, unspecified organism: Secondary | ICD-10-CM | POA: Diagnosis not present

## 2018-08-21 DIAGNOSIS — Z7722 Contact with and (suspected) exposure to environmental tobacco smoke (acute) (chronic): Secondary | ICD-10-CM | POA: Diagnosis present

## 2018-08-21 DIAGNOSIS — N390 Urinary tract infection, site not specified: Secondary | ICD-10-CM | POA: Diagnosis present

## 2018-08-21 DIAGNOSIS — E860 Dehydration: Secondary | ICD-10-CM | POA: Diagnosis present

## 2018-08-21 DIAGNOSIS — T383X6A Underdosing of insulin and oral hypoglycemic [antidiabetic] drugs, initial encounter: Secondary | ICD-10-CM | POA: Diagnosis present

## 2018-08-21 DIAGNOSIS — N289 Disorder of kidney and ureter, unspecified: Secondary | ICD-10-CM

## 2018-08-21 DIAGNOSIS — IMO0002 Reserved for concepts with insufficient information to code with codable children: Secondary | ICD-10-CM

## 2018-08-21 DIAGNOSIS — Z8349 Family history of other endocrine, nutritional and metabolic diseases: Secondary | ICD-10-CM

## 2018-08-21 DIAGNOSIS — N2889 Other specified disorders of kidney and ureter: Secondary | ICD-10-CM

## 2018-08-21 DIAGNOSIS — E1010 Type 1 diabetes mellitus with ketoacidosis without coma: Secondary | ICD-10-CM

## 2018-08-21 DIAGNOSIS — E108 Type 1 diabetes mellitus with unspecified complications: Secondary | ICD-10-CM | POA: Diagnosis present

## 2018-08-21 DIAGNOSIS — X58XXXA Exposure to other specified factors, initial encounter: Secondary | ICD-10-CM | POA: Diagnosis present

## 2018-08-21 DIAGNOSIS — N151 Renal and perinephric abscess: Secondary | ICD-10-CM | POA: Diagnosis not present

## 2018-08-21 LAB — COMPREHENSIVE METABOLIC PANEL
ALT: 31 U/L (ref 0–44)
AST: 26 U/L (ref 15–41)
Albumin: 3.1 g/dL — ABNORMAL LOW (ref 3.5–5.0)
Alkaline Phosphatase: 223 U/L — ABNORMAL HIGH (ref 38–126)
Anion gap: 20 — ABNORMAL HIGH (ref 5–15)
BUN: 9 mg/dL (ref 6–20)
CO2: 16 mmol/L — ABNORMAL LOW (ref 22–32)
Calcium: 9 mg/dL (ref 8.9–10.3)
Chloride: 93 mmol/L — ABNORMAL LOW (ref 98–111)
Creatinine, Ser: 0.94 mg/dL (ref 0.44–1.00)
GFR calc Af Amer: >60 mL/min (ref >60)
Glucose, Bld: 292 mg/dL — ABNORMAL HIGH (ref 70–99)
Potassium: 3.5 mmol/L (ref 3.5–5.1)
Sodium: 129 mmol/L — ABNORMAL LOW (ref 135–145)
Total Bilirubin: 1.7 mg/dL — ABNORMAL HIGH (ref 0.3–1.2)
Total Protein: 8.2 g/dL — ABNORMAL HIGH (ref 6.5–8.1)

## 2018-08-21 LAB — BLOOD GAS, VENOUS
Acid-base deficit: 11.1 mmol/L — ABNORMAL HIGH (ref 0.0–2.0)
Bicarbonate: 12.6 mmol/L — ABNORMAL LOW (ref 20.0–28.0)
O2 SAT: 89.3 %
Patient temperature: 98.6
pCO2, Ven: 22.7 mmHg — ABNORMAL LOW (ref 44.0–60.0)
pH, Ven: 7.363 (ref 7.250–7.430)
pO2, Ven: 58.8 mmHg — ABNORMAL HIGH (ref 32.0–45.0)

## 2018-08-21 LAB — BASIC METABOLIC PANEL
Anion gap: 11 (ref 5–15)
Anion gap: 11 (ref 5–15)
BUN: 7 mg/dL (ref 6–20)
BUN: 6 mg/dL (ref 6–20)
CHLORIDE: 104 mmol/L (ref 98–111)
CHLORIDE: 102 mmol/L (ref 98–111)
CO2: 18 mmol/L — ABNORMAL LOW (ref 22–32)
CO2: 19 mmol/L — ABNORMAL LOW (ref 22–32)
Calcium: 8 mg/dL — ABNORMAL LOW (ref 8.9–10.3)
Calcium: 8 mg/dL — ABNORMAL LOW (ref 8.9–10.3)
Creatinine, Ser: 0.7 mg/dL (ref 0.44–1.00)
Creatinine, Ser: 0.64 mg/dL (ref 0.44–1.00)
GFR calc Af Amer: >60 mL/min (ref >60)
GFR calc non Af Amer: >60 mL/min (ref >60)
GFR calc non Af Amer: >60 mL/min (ref >60)
Glucose, Bld: 197 mg/dL — ABNORMAL HIGH (ref 70–99)
Glucose, Bld: 215 mg/dL — ABNORMAL HIGH (ref 70–99)
POTASSIUM: 3.8 mmol/L (ref 3.5–5.1)
Potassium: 3.6 mmol/L (ref 3.5–5.1)
Sodium: 133 mmol/L — ABNORMAL LOW (ref 135–145)
Sodium: 132 mmol/L — ABNORMAL LOW (ref 135–145)

## 2018-08-21 LAB — CBG MONITORING, ED
GLUCOSE-CAPILLARY: 166 mg/dL — AB (ref 70–99)
GLUCOSE-CAPILLARY: 160 mg/dL — AB (ref 70–99)
Glucose-Capillary: 286 mg/dL — ABNORMAL HIGH (ref 70–99)
Glucose-Capillary: 254 mg/dL — ABNORMAL HIGH (ref 70–99)
Glucose-Capillary: 240 mg/dL — ABNORMAL HIGH (ref 70–99)
Glucose-Capillary: 158 mg/dL — ABNORMAL HIGH (ref 70–99)
Glucose-Capillary: 163 mg/dL — ABNORMAL HIGH (ref 70–99)
Glucose-Capillary: 184 mg/dL — ABNORMAL HIGH (ref 70–99)
Glucose-Capillary: 170 mg/dL — ABNORMAL HIGH (ref 70–99)
Glucose-Capillary: 149 mg/dL — ABNORMAL HIGH (ref 70–99)

## 2018-08-21 LAB — CBC
HCT: 38.2 % (ref 36.0–46.0)
Hemoglobin: 12.7 g/dL (ref 12.0–15.0)
MCH: 30 pg (ref 26.0–34.0)
MCHC: 33.2 g/dL (ref 30.0–36.0)
MCV: 90.3 fL (ref 80.0–100.0)
NRBC: 0 % (ref 0.0–0.2)
Platelets: 300 10*3/uL (ref 150–400)
RBC: 4.23 MIL/uL (ref 3.87–5.11)
RDW: 13.6 % (ref 11.5–15.5)
WBC: 16.3 10*3/uL — ABNORMAL HIGH (ref 4.0–10.5)

## 2018-08-21 LAB — URINALYSIS, ROUTINE W REFLEX MICROSCOPIC
Bilirubin Urine: NEGATIVE
Glucose, UA: >=500 mg/dL — AB
Ketones, ur: 80 mg/dL — AB
Nitrite: NEGATIVE
Protein, ur: 30 mg/dL — AB
Specific Gravity, Urine: 1.012 (ref 1.005–1.030)
WBC, UA: >50 WBC/hpf — ABNORMAL HIGH (ref 0–5)
pH: 5 (ref 5.0–8.0)

## 2018-08-21 LAB — LACTIC ACID, PLASMA
LACTIC ACID, VENOUS: 1.1 mmol/L (ref 0.5–1.9)
Lactic Acid, Venous: 1.2 mmol/L (ref 0.5–1.9)

## 2018-08-21 LAB — I-STAT BETA HCG BLOOD, ED (MC, WL, AP ONLY): I-stat hCG, quantitative: <5 m[IU]/mL (ref <5)

## 2018-08-21 LAB — LIPASE, BLOOD: LIPASE: 19 U/L (ref 11–51)

## 2018-08-21 LAB — INFLUENZA PANEL BY PCR (TYPE A & B)
Influenza A By PCR: NEGATIVE
Influenza B By PCR: NEGATIVE

## 2018-08-21 MED ORDER — ONDANSETRON HCL 4 MG/2ML IJ SOLN: 4.0000 mg | Freq: Four times a day (QID) | INTRAMUSCULAR | Status: DC | PRN

## 2018-08-21 MED ORDER — SODIUM CHLORIDE 0.9 % IV SOLN
2.0000 g | INTRAVENOUS | Status: DC
  Administered 2018-08-22 – 2018-08-23 (×2): 2 g via INTRAVENOUS
  Filled 2018-08-21 (×2): qty 20
  Filled 2018-08-21: qty 2

## 2018-08-21 MED ORDER — SODIUM CHLORIDE 0.9 % IV SOLN
1.0000 g | Freq: Once | INTRAVENOUS | Status: AC
  Administered 2018-08-21: 1 g via INTRAVENOUS
  Filled 2018-08-21: qty 10

## 2018-08-21 MED ORDER — SODIUM CHLORIDE 0.9 % IV SOLN: 1.0000 g | Freq: Once | INTRAVENOUS | Status: DC

## 2018-08-21 MED ORDER — SODIUM CHLORIDE 0.9 % IV BOLUS
1000.0000 mL | Freq: Once | INTRAVENOUS | Status: AC
  Administered 2018-08-21: 1000 mL via INTRAVENOUS

## 2018-08-21 MED ORDER — DEXTROSE-NACL 5-0.45 % IV SOLN
INTRAVENOUS | Status: DC
  Administered 2018-08-21: 16:00:00 via INTRAVENOUS

## 2018-08-21 MED ORDER — ONDANSETRON HCL 4 MG/2ML IJ SOLN
4.0000 mg | Freq: Once | INTRAMUSCULAR | Status: AC
  Administered 2018-08-21: 4 mg via INTRAVENOUS
  Filled 2018-08-21: qty 2

## 2018-08-21 MED ORDER — SODIUM CHLORIDE 0.9 % IV SOLN
1.0000 g | INTRAVENOUS | Status: AC
  Administered 2018-08-21: 1 g via INTRAVENOUS
  Filled 2018-08-21: qty 10

## 2018-08-21 MED ORDER — ACETAMINOPHEN 650 MG RE SUPP: 650.0000 mg | Freq: Four times a day (QID) | RECTAL | Status: DC | PRN

## 2018-08-21 MED ORDER — INSULIN REGULAR(HUMAN) IN NACL 100-0.9 UT/100ML-% IV SOLN: INTRAVENOUS | Status: DC

## 2018-08-21 MED ORDER — SODIUM CHLORIDE (PF) 0.9 % IJ SOLN
INTRAMUSCULAR | Status: AC
  Filled 2018-08-21: qty 50

## 2018-08-21 MED ORDER — ACETAMINOPHEN 500 MG PO TABS
1000.0000 mg | ORAL_TABLET | Freq: Once | ORAL | Status: AC
  Administered 2018-08-21: 1000 mg via ORAL
  Filled 2018-08-21: qty 2

## 2018-08-21 MED ORDER — SODIUM CHLORIDE 0.9 % IV SOLN
INTRAVENOUS | Status: DC
  Administered 2018-08-21: 20:00:00 via INTRAVENOUS

## 2018-08-21 MED ORDER — POTASSIUM CHLORIDE 10 MEQ/100ML IV SOLN
10.0000 meq | INTRAVENOUS | Status: AC
  Administered 2018-08-21: 10 meq via INTRAVENOUS
  Filled 2018-08-21: qty 100

## 2018-08-21 MED ORDER — INSULIN REGULAR(HUMAN) IN NACL 100-0.9 UT/100ML-% IV SOLN
INTRAVENOUS | Status: DC
  Administered 2018-08-21: 1.9 [IU]/h via INTRAVENOUS
  Filled 2018-08-21: qty 100

## 2018-08-21 MED ORDER — HEPARIN SODIUM (PORCINE) 5000 UNIT/ML IJ SOLN
5000.0000 [IU] | Freq: Three times a day (TID) | INTRAMUSCULAR | Status: DC
  Administered 2018-08-21 – 2018-08-24 (×8): 5000 [IU] via SUBCUTANEOUS
  Filled 2018-08-21 (×9): qty 1

## 2018-08-21 MED ORDER — IOPAMIDOL (ISOVUE-300) INJECTION 61%
100.0000 mL | Freq: Once | INTRAVENOUS | Status: AC | PRN
  Administered 2018-08-21: 100 mL via INTRAVENOUS

## 2018-08-21 MED ORDER — POTASSIUM CHLORIDE 10 MEQ/100ML IV SOLN
10.0000 meq | INTRAVENOUS | Status: AC
  Administered 2018-08-21 (×2): 10 meq via INTRAVENOUS
  Filled 2018-08-21 (×2): qty 100

## 2018-08-21 MED ORDER — IOPAMIDOL (ISOVUE-300) INJECTION 61%
INTRAVENOUS | Status: AC
  Filled 2018-08-21: qty 100

## 2018-08-21 MED ORDER — DEXTROSE-NACL 5-0.45 % IV SOLN
INTRAVENOUS | Status: DC
  Administered 2018-08-21: 100 mL/h via INTRAVENOUS

## 2018-08-21 MED ORDER — ACETAMINOPHEN 325 MG PO TABS
650.0000 mg | ORAL_TABLET | Freq: Four times a day (QID) | ORAL | Status: DC | PRN
  Administered 2018-08-22 (×2): 650 mg via ORAL
  Filled 2018-08-21 (×2): qty 2

## 2018-08-21 MED ORDER — ONDANSETRON HCL 4 MG PO TABS: 4.0000 mg | ORAL_TABLET | Freq: Four times a day (QID) | ORAL | Status: DC | PRN

## 2018-08-21 NOTE — ED Notes (Addendum)
Pt. CBG 184, RN,Sarah made aware.

## 2018-08-21 NOTE — ED Notes (Signed)
Pt wanted to wait till she got back to a room to get her blood drawn.

## 2018-08-21 NOTE — ED Notes (Signed)
MD made aware of pt glucose.

## 2018-08-21 NOTE — ED Notes (Signed)
Patient transported to CT 

## 2018-08-21 NOTE — Consult Note (Signed)
Urology Consult   Physician requesting consult: Dr. Tyrell Antonio  Reason for consult: Right renal mass  History of Present Illness: Sabrina Sutton is a 23 y.o. female with poorly controlled Type I diabetes who presented to the ED with a 4 day history of right flank pain, nausea/vomiting, and anorexia.  She had a low grade temperature last night.  She did have an E. Coli UTI in October with symptoms consistent with simple cystitis but she denies a history of recurrent UTIs or pyelonephritis.  She underwent a CT scan due to her pain and this demonstrated a right renal lesion.  She is being admitted for DKA.  She has a history of acute renal failure in the past related to complications from her diabetes but her baseline renal function is normal with a Cr of 0.9.  She denies a history of voiding or storage urinary symptoms, hematuria, UTIs, STDs, urolithiasis, GU malignancy/trauma/surgery.  Past Medical History:  Diagnosis Date  . Diabetes mellitus without complication ([place]) 4/48/18   + GAD Ab  . DKA (diabetic ketoacidoses) ([place]) 09/20/2015  . Hypokalemia   . Pyelonephritis 04/17/2016    Past Surgical History:  Procedure Laterality Date  . NO PAST SURGERIES       Current Hospital Medications:  Home meds:  No current facility-administered medications on file prior to encounter.    Current Outpatient Medications on File Prior to Encounter  Medication Sig Dispense Refill  . LANTUS SOLOSTAR 100 UNIT/ML Solostar Pen INJECT 30 UNITS UNDER THE SKIN DAILY BEFORE BREAKFAST (Patient taking differently: Inject 30 Units into the skin daily before breakfast. Send home with patient when discharged. Do NOT mix with other insulins.) 15 mL 0  . NOVOLOG FLEXPEN 100 UNIT/ML FlexPen ADMINISTER 10 UNITS UNDER THE SKIN THREE TIMES DAILY BEFORE MEALS (Patient taking differently: Inject 10 Units into the skin 3 (three) times daily before meals. ) 15 mL 0  . ACCU-CHEK AVIVA PLUS test strip USE AS DIRECTED 100  each 11  . ACCU-CHEK FASTCLIX LANCETS MISC Check sugar 10 x daily 304 each 3  . acetone, urine, test strip Check ketones per protocol (Patient not taking: Reported on 05/03/2018) 50 each 3  . Alcohol Swabs (ALCOHOL PADS) 70 % PADS Use to wipe skin prior to insulin injections twice daily 200 each 6  . glucagon 1 MG injection Inject 1mg  IM if unconscious, seizing, or unable to eat to correct low blood sugar (Patient not taking: Reported on 05/03/2018) 2 each 2  . glucose blood (ACCU-CHEK AVIVA PLUS) test strip Please check blood sugars 4 times a day 300 each 12  . Insulin Pen Needle (B-D UF III MINI PEN NEEDLES) 31G X 5 MM MISC CHECK BLOOD SUGAR IN THE MORNING BEFORE EATING, BEFORE EACH MEAL, AND AS NEEDED 100 each 2  . Insulin Pen Needle (B-D UF III MINI PEN NEEDLES) 31G X 5 MM MISC USE TO CHECK BLOOD SUGAR IN THE MORNING BEFORE EATING, BEFORE EACH MEAL, AND AS NEEDED 100 each 0  . Lancets (ACCU-CHEK SOFT TOUCH) lancets Use as directed. 100 each 5  . ondansetron (ZOFRAN-ODT) 4 MG disintegrating tablet Take 1 tablet (4 mg total) by mouth every 6 (six) hours as needed for nausea or vomiting. (Patient not taking: Reported on 04/13/2018) 20 tablet 0     Scheduled Meds: . iopamidol      . sodium chloride (PF)       Continuous Infusions: . [START ON 08/22/2018] cefTRIAXone (ROCEPHIN)  IV    . dextrose 5 %  and 0.45% NaCl 125 mL/hr at 08/21/18 1605  . insulin Stopped (08/21/18 1618)   PRN Meds:.acetaminophen **OR** acetaminophen, ondansetron **OR** ondansetron (ZOFRAN) IV  Allergies: No Known Allergies  Family History  Problem Relation Age of Onset  . Diabetes Maternal Grandmother   . Heart disease Maternal Grandmother        Deceased from MI at age 57  . Hypertension Maternal Grandmother   . Hypercholesterolemia Mother   . Seizures Mother   . Kidney Stones Mother   . Hyperlipidemia Mother   . Stroke Maternal Grandfather        Deceased from stroke at age 38  . Hypertension Paternal  Grandmother   . Healthy Father     Social History:  reports that she is a non-smoker but has been exposed to tobacco smoke. She has never used smokeless tobacco. She reports current alcohol use. She reports current drug use. Drug: Marijuana.  ROS: A complete review of systems was performed.  All systems are negative except for pertinent findings as noted.  Physical Exam:  Vital signs in last 24 hours: Temp:  [100 F (37.8 C)] 100 F (37.8 C) (01/29 0749) Pulse Rate:  [109-140] 124 (01/29 1745) Resp:  [17-38] 38 (01/29 1745) BP: (98-125)/(53-82) 120/75 (01/29 1745) SpO2:  [97 %-100 %] 99 % (01/29 1745) Constitutional:  Alert and oriented, No acute distress Cardiovascular: Regular but tachycardic, No JVD Respiratory: Normal respiratory effort, Lungs clear bilaterally GI: Abdomen is soft, nondistended, no abdominal masses. She does have moderate RUQ tenderness to palpation without guarding or rebound tenderness.  GU: No CVA tenderness Lymphatic: No lymphadenopathy Neurologic: Grossly intact, no focal deficits Psychiatric: Normal mood and affect  Laboratory Data:  Recent Labs    08/21/18 1033  WBC 16.3*  HGB 12.7  HCT 38.2  PLT 300    Recent Labs    08/21/18 1033 08/21/18 1603  NA 129* 133*  K 3.5 3.8  CL 93* 104  GLUCOSE 292* 197*  BUN 9 7  CALCIUM 9.0 8.0*  CREATININE 0.94 0.70     Results for orders placed or performed during the hospital encounter of 08/21/18 (from the past 24 hour(s))  Lipase, blood     Status: None   Collection Time: 08/21/18 10:33 AM  Result Value Ref Range   Lipase 19 11 - 51 U/L  Comprehensive metabolic panel     Status: Abnormal   Collection Time: 08/21/18 10:33 AM  Result Value Ref Range   Sodium 129 (L) 135 - 145 mmol/L   Potassium 3.5 3.5 - 5.1 mmol/L   Chloride 93 (L) 98 - 111 mmol/L   CO2 16 (L) 22 - 32 mmol/L   Glucose, Bld 292 (H) 70 - 99 mg/dL   BUN 9 6 - 20 mg/dL   Creatinine, Ser 0.94 0.44 - 1.00 mg/dL   Calcium 9.0  8.9 - 10.3 mg/dL   Total Protein 8.2 (H) 6.5 - 8.1 g/dL   Albumin 3.1 (L) 3.5 - 5.0 g/dL   AST 26 15 - 41 U/L   ALT 31 0 - 44 U/L   Alkaline Phosphatase 223 (H) 38 - 126 U/L   Total Bilirubin 1.7 (H) 0.3 - 1.2 mg/dL   GFR calc non Af Amer >60 >60 mL/min   GFR calc Af Amer >60 >60 mL/min   Anion gap 20 (H) 5 - 15  CBC     Status: Abnormal   Collection Time: 08/21/18 10:33 AM  Result Value Ref Range   WBC  16.3 (H) 4.0 - 10.5 K/uL   RBC 4.23 3.87 - 5.11 MIL/uL   Hemoglobin 12.7 12.0 - 15.0 g/dL   HCT 38.2 36.0 - 46.0 %   MCV 90.3 80.0 - 100.0 fL   MCH 30.0 26.0 - 34.0 pg   MCHC 33.2 30.0 - 36.0 g/dL   RDW 13.6 11.5 - 15.5 %   Platelets 300 150 - 400 K/uL   nRBC 0.0 0.0 - 0.2 %  POC CBG, ED     Status: Abnormal   Collection Time: 08/21/18 10:37 AM  Result Value Ref Range   Glucose-Capillary 286 (H) 70 - 99 mg/dL  I-Stat beta hCG blood, ED     Status: None   Collection Time: 08/21/18 10:39 AM  Result Value Ref Range   I-stat hCG, quantitative <5.0 <5 mIU/mL   Comment 3          Influenza panel by PCR (type A & B)     Status: None   Collection Time: 08/21/18 10:39 AM  Result Value Ref Range   Influenza A By PCR NEGATIVE NEGATIVE   Influenza B By PCR NEGATIVE NEGATIVE  Urinalysis, Routine w reflex microscopic     Status: Abnormal   Collection Time: 08/21/18 12:39 PM  Result Value Ref Range   Color, Urine YELLOW YELLOW   APPearance CLOUDY (A) CLEAR   Specific Gravity, Urine 1.012 1.005 - 1.030   pH 5.0 5.0 - 8.0   Glucose, UA >=500 (A) NEGATIVE mg/dL   Hgb urine dipstick MODERATE (A) NEGATIVE   Bilirubin Urine NEGATIVE NEGATIVE   Ketones, ur 80 (A) NEGATIVE mg/dL   Protein, ur 30 (A) NEGATIVE mg/dL   Nitrite NEGATIVE NEGATIVE   Leukocytes, UA LARGE (A) NEGATIVE   RBC / HPF 21-50 0 - 5 RBC/hpf   WBC, UA >50 (H) 0 - 5 WBC/hpf   Bacteria, UA FEW (A) NONE SEEN   Squamous Epithelial / LPF 0-5 0 - 5   WBC Clumps PRESENT    Mucus PRESENT   CBG monitoring, ED     Status:  Abnormal   Collection Time: 08/21/18 12:53 PM  Result Value Ref Range   Glucose-Capillary 254 (H) 70 - 99 mg/dL  Blood gas, venous     Status: Abnormal   Collection Time: 08/21/18 12:55 PM  Result Value Ref Range   pH, Ven 7.363 7.250 - 7.430   pCO2, Ven 22.7 (L) 44.0 - 60.0 mmHg   pO2, Ven 58.8 (H) 32.0 - 45.0 mmHg   Bicarbonate 12.6 (L) 20.0 - 28.0 mmol/L   Acid-base deficit 11.1 (H) 0.0 - 2.0 mmol/L   O2 Saturation 89.3 %   Patient temperature 98.6    Collection site VEIN    Drawn by COLLECTED BY LABORATORY    Sample type VENOUS   CBG monitoring, ED     Status: Abnormal   Collection Time: 08/21/18  2:28 PM  Result Value Ref Range   Glucose-Capillary 240 (H) 70 - 99 mg/dL  Lactic acid, plasma     Status: None   Collection Time: 08/21/18  3:04 PM  Result Value Ref Range   Lactic Acid, Venous 1.2 0.5 - 1.9 mmol/L  CBG monitoring, ED     Status: Abnormal   Collection Time: 08/21/18  3:31 PM  Result Value Ref Range   Glucose-Capillary 158 (H) 70 - 99 mg/dL  Basic metabolic panel     Status: Abnormal   Collection Time: 08/21/18  4:03 PM  Result Value Ref Range  Sodium 133 (L) 135 - 145 mmol/L   Potassium 3.8 3.5 - 5.1 mmol/L   Chloride 104 98 - 111 mmol/L   CO2 18 (L) 22 - 32 mmol/L   Glucose, Bld 197 (H) 70 - 99 mg/dL   BUN 7 6 - 20 mg/dL   Creatinine, Ser 0.70 0.44 - 1.00 mg/dL   Calcium 8.0 (L) 8.9 - 10.3 mg/dL   GFR calc non Af Amer >60 >60 mL/min   GFR calc Af Amer >60 >60 mL/min   Anion gap 11 5 - 15  Lactic acid, plasma     Status: None   Collection Time: 08/21/18  4:36 PM  Result Value Ref Range   Lactic Acid, Venous 1.1 0.5 - 1.9 mmol/L  CBG monitoring, ED     Status: Abnormal   Collection Time: 08/21/18  4:38 PM  Result Value Ref Range   Glucose-Capillary 163 (H) 70 - 99 mg/dL  CBG monitoring, ED     Status: Abnormal   Collection Time: 08/21/18  5:57 PM  Result Value Ref Range   Glucose-Capillary 166 (H) 70 - 99 mg/dL   No results found for this or any  previous visit (from the past 240 hour(s)).  Renal Function: Recent Labs    08/21/18 1033 08/21/18 1603  CREATININE 0.94 0.70   CrCl cannot be calculated (Unknown ideal weight.).  Radiologic Imaging: Ct Abdomen Pelvis W Contrast  Result Date: 08/21/2018 CLINICAL DATA:  Acute right-sided abdominal pain. EXAM: CT ABDOMEN AND PELVIS WITH CONTRAST TECHNIQUE: Multidetector CT imaging of the abdomen and pelvis was performed using the standard protocol following bolus administration of intravenous contrast. CONTRAST:  130mL ISOVUE-300 IOPAMIDOL (ISOVUE-300) INJECTION 61% COMPARISON:  None. FINDINGS: Lower chest: No acute abnormality. Hepatobiliary: No focal liver abnormality is seen. No gallstones, gallbladder wall thickening, or biliary dilatation. Pancreas: Unremarkable. No pancreatic ductal dilatation or surrounding inflammatory changes. Spleen: Normal in size without focal abnormality. Adrenals/Urinary Tract: Adrenal glands appear normal. 3.4 x 2.8 cm heterogeneously enhancing mass is seen in the posterior portion of the midpole of the right kidney concerning for neoplasm or malignancy. Left kidney demonstrates wedge-shaped low density most consistent with cyst, but other pathology can not be excluded. No hydronephrosis or renal obstruction is noted. Urinary bladder is unremarkable. No renal or ureteral calculi are noted. Stomach/Bowel: Stomach is within normal limits. Appendix appears normal. No evidence of bowel wall thickening, distention, or inflammatory changes. Vascular/Lymphatic: No significant vascular findings are present. No enlarged abdominal or pelvic lymph nodes. Reproductive: Uterus and left adnexal regions are unremarkable. 3.2 cm right ovarian cyst is noted. Other: No abdominal wall hernia or abnormality. No abdominopelvic ascites. Musculoskeletal: No acute or significant osseous findings. IMPRESSION: 3.4 cm heterogeneously enhancing mass seen in the right kidney concerning for probable  renal cell carcinoma. Abscess is less likely. Further evaluation with MRI and urologic consult is recommended. These results were called by telephone at the time of interpretation on 08/21/2018 at 2:13 pm to Dr. Franchot Heidelberg , who verbally acknowledged these results. Wedge-shaped low density seen in the left kidney which may represent cyst, but other pathology can not be excluded. Further evaluation with ultrasound is recommended. 3.2 cm right ovarian cyst. Electronically Signed   By: Marijo Conception, M.D.   On: 08/21/2018 14:13    I independently reviewed the above imaging studies.  Impression/Recommendation: Right renal mass/UTI:  I reviewed her CT scan and her clinical picture is most consistent with an infectious etiology.  Her imaging is likely  a focal segmental pyelonephritis with early abscess formation although a renal tumor cannot be definitively excluded at this time.  I would recommend continued broad spectrum IV antibiotics (she is currently receiving ceftriaxone) pending her urine and blood culture results.  If she improves clinically over the next few days, she will just need appropriate antibiotic therapy but likely will need to continue therapy for 4-6 weeks with repeat abdominal imaging afterwards.  If she is not clinically improving over the next 48-72 hours, will consider repeat imaging at that time to ensure she does not have progression of abscess formation.  Will continue to follow her in the hospital.  Dutch Gray 08/21/2018, 6:11 PM  Pryor Curia. MD   CC: Dr. Frederic Jericho

## 2018-08-21 NOTE — ED Notes (Signed)
Pt cbg 160. RN notified

## 2018-08-21 NOTE — ED Notes (Signed)
Pt. CBG 149, RN,Sarah made aware.

## 2018-08-21 NOTE — ED Notes (Signed)
Pt c/o abd pains, vomiting, headache for 3 days.

## 2018-08-21 NOTE — H&P (Signed)
History and Physical  Andrienne Havener CZY:[id number] DOB: [birth year] DOA: 08/21/2018  PCP: Steve Rattler, DO Patient coming from: Home   I have personally briefly reviewed patient's old medical records in [place]   Chief Complaint: Right side flank pain, nausea and vomiting.  HPI: Sabrina Sutton is a 23 y.o. female past medical history significant for type 1 diabetes, pyelonephritis, who presents complaining of nausea vomiting, right-sided flank pain that is started to 3 days prior to admission.  He reports the pain is sharp, 8 out of 10.  Symptoms has been associated with chills, sweats.  She has been using her insulin.  She denies dysuria, chest pain, shortness of breath.  Evaluation in the ED; tachycardic heart rate 130s, febrile temperature 093, systolic blood pressure in the 90s.  Lactic acid 1.1, pH 7.3, bicarb 16, glucose 292, sodium 129, anion gap 20, alkaline phosphatase 223, white blood cell 16.  UA white blood cell more than 50 red blood cell 21-50.  Pregnancy test negative.  CT abdomen and pelvis:3.4 cm heterogeneously enhancing mass seen in the right kidney concerning for probable renal cell carcinoma. Abscess is less likely. Further evaluation with MRI and urologic consult is recommended.  Wedge-shaped low density seen in the left kidney which may represent cyst, but other pathology can not be excluded. Further evaluation with ultrasound is recommended Audiology will see patient in consultation.  Review of Systems: All systems reviewed and apart from history of presenting illness, are negative.  Past Medical History:  Diagnosis Date  . Diabetes mellitus without complication ([place]) 2/35/57   + GAD Ab  . DKA (diabetic ketoacidoses) ([place]) 09/20/2015  . Hypokalemia   . Pyelonephritis 04/17/2016   Past Surgical History:  Procedure Laterality Date  . NO PAST SURGERIES     Social History:  reports that she is a non-smoker but has been exposed to tobacco smoke.  She has never used smokeless tobacco. She reports current alcohol use. She reports current drug use. Drug: Marijuana.   No Known Allergies  Family History  Problem Relation Age of Onset  . Diabetes Maternal Grandmother   . Heart disease Maternal Grandmother        Deceased from MI at age 67  . Hypertension Maternal Grandmother   . Hypercholesterolemia Mother   . Seizures Mother   . Kidney Stones Mother   . Hyperlipidemia Mother   . Stroke Maternal Grandfather        Deceased from stroke at age 60  . Hypertension Paternal Grandmother   . Healthy Father     Prior to Admission medications   Medication Sig Start Date End Date Taking? Authorizing Provider  LANTUS SOLOSTAR 100 UNIT/ML Solostar Pen INJECT 30 UNITS UNDER THE SKIN DAILY BEFORE BREAKFAST Patient taking differently: Inject 30 Units into the skin daily before breakfast. Send home with patient when discharged. Do NOT mix with other insulins. 07/29/18  Yes Riccio, Angela C, DO  NOVOLOG FLEXPEN 100 UNIT/ML FlexPen ADMINISTER 10 UNITS UNDER THE SKIN THREE TIMES DAILY BEFORE MEALS Patient taking differently: Inject 10 Units into the skin 3 (three) times daily before meals.  07/29/18  Yes Riccio, Levada Dy C, DO  ACCU-CHEK AVIVA PLUS test strip USE AS DIRECTED 06/18/18   Steve Rattler, DO  ACCU-CHEK FASTCLIX LANCETS MISC Check sugar 10 x daily 06/15/17   Rogue Bussing, MD  acetone, urine, test strip Check ketones per protocol Patient not taking: Reported on 05/03/2018 09/23/15   Levon Hedger, MD  Alcohol Swabs (ALCOHOL PADS) 70 % PADS Use to wipe skin prior to insulin injections twice daily 09/23/15   Levon Hedger, MD  glucagon 1 MG injection Inject 66m IM if unconscious, seizing, or unable to eat to correct low blood sugar Patient not taking: Reported on 05/03/2018 09/23/15   JLevon Hedger MD  glucose blood (ACCU-CHEK AVIVA PLUS) test strip Please check blood sugars 4 times a day 05/03/18   RLucila MaineC, DO  Insulin Pen Needle (B-D UF III MINI PEN NEEDLES) 31G X 5 MM MISC CHECK BLOOD SUGAR IN THE MORNING BEFORE EATING, BEFORE EACH MEAL, AND AS NEEDED 06/15/17   FRogue Bussing MD  Insulin Pen Needle (B-D UF III MINI PEN NEEDLES) 31G X 5 MM MISC USE TO CHECK BLOOD SUGAR IN THE MORNING BEFORE EATING, BEFORE EACH MEAL, AND AS NEEDED 08/14/18   RLucila MaineC, DO  Lancets (ACCU-CHEK SOFT TOUCH) lancets Use as directed. 10/24/17   WKathrene Alu MD  ondansetron (ZOFRAN-ODT) 4 MG disintegrating tablet Take 1 tablet (4 mg total) by mouth every 6 (six) hours as needed for nausea or vomiting. Patient not taking: Reported on 04/13/2018 06/15/17   FRogue Bussing MD   Physical Exam: Vitals:   08/21/18 1400 08/21/18 1430 08/21/18 1500 08/21/18 1530  BP: 102/64 102/63 (!) 98/57 99/64  Pulse: (!) 109 (!) 111 (!) 110 (!) 109  Resp: 19 (!) 22 19 (!) 29  Temp:      TempSrc:      SpO2: 100% 100% 99% 100%  Height:         General exam: Moderately built and nourished patient, lying comfortably supine on the gurney in no obvious distress.  Head, eyes and ENT: Nontraumatic and normocephalic. Pupils equally reacting to light and accommodation. Oral mucosa moist.  Neck: Supple. No JVD, carotid bruit or thyromegaly.  Lymphatics: No lymphadenopathy.  Respiratory system: Clear to auscultation.   Mild tachypnea  Cardiovascular system: S1 and S2 heard, RRR. No JVD, murmurs, gallops, clicks or pedal edema.  Gastrointestinal system: Bowel sounds present, abdomen is soft, tenderness in the right side of abdomen and flank area, CVA tenderness.  Central nervous system: Alert and oriented. No focal neurological deficits.  Extremities: Symmetric 5 x 5 power. Peripheral pulses symmetrically felt.   Skin: No rashes or acute findings.  Musculoskeletal system: Negative exam.  Psychiatry: Pleasant and cooperative.   Labs on Admission:  Basic Metabolic Panel: Recent Labs  Lab  08/21/18 1033  NA 129*  K 3.5  CL 93*  CO2 16*  GLUCOSE 292*  BUN 9  CREATININE 0.94  CALCIUM 9.0   Liver Function Tests: Recent Labs  Lab 08/21/18 1033  AST 26  ALT 31  ALKPHOS 223*  BILITOT 1.7*  PROT 8.2*  ALBUMIN 3.1*   Recent Labs  Lab 08/21/18 1033  LIPASE 19   No results for input(s): AMMONIA in the last 168 hours. CBC: Recent Labs  Lab 08/21/18 1033  WBC 16.3*  HGB 12.7  HCT 38.2  MCV 90.3  PLT 300   Cardiac Enzymes: No results for input(s): CKTOTAL, CKMB, CKMBINDEX, TROPONINI in the last 168 hours.  BNP (last 3 results) No results for input(s): PROBNP in the last 8760 hours. CBG: Recent Labs  Lab 08/21/18 1037 08/21/18 1253 08/21/18 1428 08/21/18 1531  GLUCAP 286* 254* 240* 158*    Radiological Exams on Admission: Ct Abdomen Pelvis W Contrast  Result Date: 08/21/2018 CLINICAL DATA:  Acute right-sided abdominal pain.  EXAM: CT ABDOMEN AND PELVIS WITH CONTRAST TECHNIQUE: Multidetector CT imaging of the abdomen and pelvis was performed using the standard protocol following bolus administration of intravenous contrast. CONTRAST:  190m ISOVUE-300 IOPAMIDOL (ISOVUE-300) INJECTION 61% COMPARISON:  None. FINDINGS: Lower chest: No acute abnormality. Hepatobiliary: No focal liver abnormality is seen. No gallstones, gallbladder wall thickening, or biliary dilatation. Pancreas: Unremarkable. No pancreatic ductal dilatation or surrounding inflammatory changes. Spleen: Normal in size without focal abnormality. Adrenals/Urinary Tract: Adrenal glands appear normal. 3.4 x 2.8 cm heterogeneously enhancing mass is seen in the posterior portion of the midpole of the right kidney concerning for neoplasm or malignancy. Left kidney demonstrates wedge-shaped low density most consistent with cyst, but other pathology can not be excluded. No hydronephrosis or renal obstruction is noted. Urinary bladder is unremarkable. No renal or ureteral calculi are noted. Stomach/Bowel:  Stomach is within normal limits. Appendix appears normal. No evidence of bowel wall thickening, distention, or inflammatory changes. Vascular/Lymphatic: No significant vascular findings are present. No enlarged abdominal or pelvic lymph nodes. Reproductive: Uterus and left adnexal regions are unremarkable. 3.2 cm right ovarian cyst is noted. Other: No abdominal wall hernia or abnormality. No abdominopelvic ascites. Musculoskeletal: No acute or significant osseous findings. IMPRESSION: 3.4 cm heterogeneously enhancing mass seen in the right kidney concerning for probable renal cell carcinoma. Abscess is less likely. Further evaluation with MRI and urologic consult is recommended. These results were called by telephone at the time of interpretation on 08/21/2018 at 2:13 pm to Dr. SFranchot Heidelberg, who verbally acknowledged these results. Wedge-shaped low density seen in the left kidney which may represent cyst, but other pathology can not be excluded. Further evaluation with ultrasound is recommended. 3.2 cm right ovarian cyst. Electronically Signed   By: JMarijo Conception M.D.   On: 08/21/2018 14:13    EKG: tachycardic on telemetry   Assessment/Plan Active Problems:   Type 1 diabetes mellitus without complication (HCC)   D[place], type 1 ([place]   Urinary tract infection without hematuria   [place]   Tachycardia   Renal mass  1-DKA; patient present with increased anion gap at 28, bicarb 16 blood sugar 290.  Likely in the setting of infection. She has been compliant with her insulin, she uses Lantus 30 units daily. Continue with IV fluids, insulin drip. Blood sugar less than 250, IV fluids will be changed to D5 half-normal saline. Follow the B-Met.  Supplement potassium. We will need to transition from insulin drip to home dose Lantus when gap is close.  2 -Sepsis; pyelonephritis.  Presents with hypotension, tachycardia, fever.  Leukocytosis white blood cell at 16.  UA with more than 50 white blood  cell.  Nausea vomiting. CT abdomen showed renal mass versus infection. IV fluids, IV ceftriaxone. Increase ceftriaxone to 2 gr IV.  Check blood cultures, follow urine culture. We will check chlamydia and gonorrhea and urine.  3-Renal mass, CT report renal mass versus less likely abscess. Patient will be evaluated by urology. Continue with IV fluids. IV antibiotics.   4 -screening for HIV.  DVT Prophylaxis: Heparin Code Status: Full code Family Communication: Care discussed with patient Disposition Plan: Admit to the hospital for treatment of DKA, sepsis, UTI, concern for renal mass versus abscess.  Stepdown unit.  Time spent: 75 minutes.   BElmarie ShileyMD [facility]   If 7PM-7AM, please contact night-coverage [email or web address] Password [email or web address] 08/21/2018, 3:53 PM

## 2018-08-21 NOTE — ED Notes (Signed)
Pt. CBG 170, RN,Sarah made aware.

## 2018-08-21 NOTE — ED Provider Notes (Signed)
[facility] DEPT Provider Note   CSN: [id number] Arrival date & time: 08/21/18  4098     History   Chief Complaint Chief Complaint  Patient presents with  . Abdominal Pain  . Emesis  . Diarrhea    HPI Sabrina Sutton is a 23 y.o. female presenting for evaluation of nausea, vomiting, abdominal pain.  Patient states 3 days ago, she developed right upper quadrant abdominal pain, nausea, and vomiting.  She has been having subjective fevers at home.  She reports throwing up 3-5 times a day, it is nonbloody and nonbilious.  She has been unable to tolerate solid foods, has been drinking some water without difficulty.  She denies sick contacts.  She denies chest pain, shortness of breath, left-sided abdominal pain, abnormal urination, or abnormal bowel movements.  Patient states her pain is mostly on the right side, it is constant.  It was mildly improved with ibuprofen and cold and flu medicine.  Additional history obtained from chart review, patient with a history of diabetes and frequent UTIs.  HPI  Past Medical History:  Diagnosis Date  . Diabetes mellitus without complication ([place]) 08/11/12   + GAD Ab  . DKA (diabetic ketoacidoses) ([place]) 09/20/2015  . Hypokalemia   . Pyelonephritis 04/17/2016    Patient Active Problem List   Diagnosis Date Noted  . Urinary tract infection without hematuria   . Sepsis ([place])   . Near syncope 05/03/2018  . Type 2 diabetes mellitus with hyperglycemia, with long-term current use of insulin ([place])   . Condyloma acuminatum of vulva 10/31/2017  . 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 Frequent No-show for appointment 11/03/2016  . Type 1 diabetes mellitus without complication ([place]) 78/29/5621    Past Surgical History:  Procedure Laterality Date  . NO PAST SURGERIES       OB History   No obstetric history on file.      Home Medications    Prior to Admission medications   Medication Sig Start Date End Date Taking?  Authorizing Provider  LANTUS SOLOSTAR 100 UNIT/ML Solostar Pen INJECT 30 UNITS UNDER THE SKIN DAILY BEFORE BREAKFAST Patient taking differently: Inject 30 Units into the skin daily before breakfast. Send home with patient when discharged. Do NOT mix with other insulins. 07/29/18  Yes Riccio, Angela C, DO  NOVOLOG FLEXPEN 100 UNIT/ML FlexPen ADMINISTER 10 UNITS UNDER THE SKIN THREE TIMES DAILY BEFORE MEALS Patient taking differently: Inject 10 Units into the skin 3 (three) times daily before meals.  07/29/18  Yes Riccio, Levada Dy C, DO  ACCU-CHEK AVIVA PLUS test strip USE AS DIRECTED 06/18/18   Steve Rattler, DO  ACCU-CHEK FASTCLIX LANCETS MISC Check sugar 10 x daily 06/15/17   Rogue Bussing, MD  acetone, urine, test strip Check ketones per protocol Patient not taking: Reported on 05/03/2018 09/23/15   Levon Hedger, MD  Alcohol Swabs (ALCOHOL PADS) 70 % PADS Use to wipe skin prior to insulin injections twice daily 09/23/15   Levon Hedger, MD  glucagon 1 MG injection Inject 1mg  IM if unconscious, seizing, or unable to eat to correct low blood sugar Patient not taking: Reported on 05/03/2018 09/23/15   Levon Hedger, MD  glucose blood (ACCU-CHEK AVIVA PLUS) test strip Please check blood sugars 4 times a day 05/03/18   Lucila Maine C, DO  Insulin Pen Needle (B-D UF III MINI PEN NEEDLES) 31G X 5 MM MISC CHECK BLOOD SUGAR IN THE MORNING BEFORE EATING, BEFORE EACH MEAL, AND AS  NEEDED 06/15/17   Rogue Bussing, MD  Insulin Pen Needle (B-D UF III MINI PEN NEEDLES) 31G X 5 MM MISC USE TO CHECK BLOOD SUGAR IN THE MORNING BEFORE EATING, BEFORE EACH MEAL, AND AS NEEDED 08/14/18   Lucila Maine C, DO  Lancets (ACCU-CHEK SOFT TOUCH) lancets Use as directed. 10/24/17   Kathrene Alu, MD  ondansetron (ZOFRAN-ODT) 4 MG disintegrating tablet Take 1 tablet (4 mg total) by mouth every 6 (six) hours as needed for nausea or vomiting. Patient not taking: Reported on 04/13/2018  06/15/17   Rogue Bussing, MD    Family History Family History  Problem Relation Age of Onset  . Diabetes Maternal Grandmother   . Heart disease Maternal Grandmother        Deceased from MI at age 29  . Hypertension Maternal Grandmother   . Hypercholesterolemia Mother   . Seizures Mother   . Kidney Stones Mother   . Hyperlipidemia Mother   . Stroke Maternal Grandfather        Deceased from stroke at age 77  . Hypertension Paternal Grandmother   . Healthy Father     Social History Social History   Tobacco Use  . Smoking status: Passive Smoke Exposure - Never Smoker  . Smokeless tobacco: Never Used  Substance Use Topics  . Alcohol use: Yes    Comment: Last drank 1 month ago per MD note  . Drug use: Yes    Types: Marijuana    Comment: Last used 1  month ago per MD note     Allergies   Patient has no known allergies.   Review of Systems Review of Systems  Gastrointestinal: Positive for abdominal pain, nausea and vomiting.  All other systems reviewed and are negative.    Physical Exam Updated Vital Signs BP 98/64 (BP Location: Left Arm)   Pulse (!) 115   Temp 100 F (37.8 C) (Oral)   Resp 18   Ht 5\' 7"  (1.702 m)   LMP 07/25/2018 Comment: negative HCG blood test 08-21-2018  SpO2 100%   BMI 21.44 kg/m   Physical Exam Vitals signs and nursing note reviewed.  Constitutional:      Comments: Appears dehydrated.  HENT:     Head: Normocephalic and atraumatic.     Comments: MM dry Eyes:     Conjunctiva/sclera: Conjunctivae normal.     Pupils: Pupils are equal, round, and reactive to light.  Neck:     Musculoskeletal: Normal range of motion and neck supple.  Cardiovascular:     Rate and Rhythm: Normal rate and regular rhythm.  Pulmonary:     Effort: Pulmonary effort is normal. No respiratory distress.     Breath sounds: Normal breath sounds. No wheezing.     Comments: Clear lung sounds in all fields Abdominal:     General: There is no  distension.     Palpations: Abdomen is soft. There is no mass.     Tenderness: There is abdominal tenderness. There is no guarding or rebound.     Comments: TTP of R sided abd, worse in the RUQ. No cva tenderness.  Musculoskeletal: Normal range of motion.     Comments: No TTP of back or midline spine.   Skin:    General: Skin is warm and dry.  Neurological:     Mental Status: She is alert and oriented to person, place, and time.      ED Treatments / Results  Labs (all labs ordered are listed,  but only abnormal results are displayed) Labs Reviewed  COMPREHENSIVE METABOLIC PANEL - Abnormal; Notable for the following components:      Result Value   Sodium 129 (*)    Chloride 93 (*)    CO2 16 (*)    Glucose, Bld 292 (*)    Total Protein 8.2 (*)    Albumin 3.1 (*)    Alkaline Phosphatase 223 (*)    Total Bilirubin 1.7 (*)    Anion gap 20 (*)    All other components within normal limits  CBC - Abnormal; Notable for the following components:   WBC 16.3 (*)    All other components within normal limits  URINALYSIS, ROUTINE W REFLEX MICROSCOPIC - Abnormal; Notable for the following components:   APPearance CLOUDY (*)    Glucose, UA >=500 (*)    Hgb urine dipstick MODERATE (*)    Ketones, ur 80 (*)    Protein, ur 30 (*)    Leukocytes, UA LARGE (*)    WBC, UA >50 (*)    Bacteria, UA FEW (*)    All other components within normal limits  BLOOD GAS, VENOUS - Abnormal; Notable for the following components:   pCO2, Ven 22.7 (*)    pO2, Ven 58.8 (*)    Bicarbonate 12.6 (*)    Acid-base deficit 11.1 (*)    All other components within normal limits  CBG MONITORING, ED - Abnormal; Notable for the following components:   Glucose-Capillary 286 (*)    All other components within normal limits  CBG MONITORING, ED - Abnormal; Notable for the following components:   Glucose-Capillary 254 (*)    All other components within normal limits  URINE CULTURE  LIPASE, BLOOD  INFLUENZA PANEL BY  PCR (TYPE A & B)  I-STAT BETA HCG BLOOD, ED ([facility], [facility], [facility] ONLY)    EKG None  Radiology Ct Abdomen Pelvis W Contrast  Result Date: 08/21/2018 CLINICAL DATA:  Acute right-sided abdominal pain. EXAM: CT ABDOMEN AND PELVIS WITH CONTRAST TECHNIQUE: Multidetector CT imaging of the abdomen and pelvis was performed using the standard protocol following bolus administration of intravenous contrast. CONTRAST:  185mL ISOVUE-300 IOPAMIDOL (ISOVUE-300) INJECTION 61% COMPARISON:  None. FINDINGS: Lower chest: No acute abnormality. Hepatobiliary: No focal liver abnormality is seen. No gallstones, gallbladder wall thickening, or biliary dilatation. Pancreas: Unremarkable. No pancreatic ductal dilatation or surrounding inflammatory changes. Spleen: Normal in size without focal abnormality. Adrenals/Urinary Tract: Adrenal glands appear normal. 3.4 x 2.8 cm heterogeneously enhancing mass is seen in the posterior portion of the midpole of the right kidney concerning for neoplasm or malignancy. Left kidney demonstrates wedge-shaped low density most consistent with cyst, but other pathology can not be excluded. No hydronephrosis or renal obstruction is noted. Urinary bladder is unremarkable. No renal or ureteral calculi are noted. Stomach/Bowel: Stomach is within normal limits. Appendix appears normal. No evidence of bowel wall thickening, distention, or inflammatory changes. Vascular/Lymphatic: No significant vascular findings are present. No enlarged abdominal or pelvic lymph nodes. Reproductive: Uterus and left adnexal regions are unremarkable. 3.2 cm right ovarian cyst is noted. Other: No abdominal wall hernia or abnormality. No abdominopelvic ascites. Musculoskeletal: No acute or significant osseous findings. IMPRESSION: 3.4 cm heterogeneously enhancing mass seen in the right kidney concerning for probable renal cell carcinoma. Abscess is less likely. Further evaluation with MRI and urologic consult is recommended. These  results were called by telephone at the time of interpretation on 08/21/2018 at 2:13 pm to Dr. Franchot Heidelberg , who verbally  acknowledged these results. Wedge-shaped low density seen in the left kidney which may represent cyst, but other pathology can not be excluded. Further evaluation with ultrasound is recommended. 3.2 cm right ovarian cyst. Electronically Signed   By: Marijo Conception, M.D.   On: 08/21/2018 14:13    Procedures .Critical Care Performed by: Franchot Heidelberg, PA-C Authorized by: Franchot Heidelberg, PA-C   Critical care provider statement:    Critical care time (minutes):  45   Critical care time was exclusive of:  Separately billable procedures and treating other patients and teaching time   Critical care was necessary to treat or prevent imminent or life-threatening deterioration of the following conditions:  Metabolic crisis   Critical care was time spent personally by me on the following activities:  Blood draw for specimens, development of treatment plan with patient or surrogate, discussions with consultants, evaluation of patient's response to treatment, examination of patient, obtaining history from patient or surrogate, ordering and performing treatments and interventions, ordering and review of laboratory studies, ordering and review of radiographic studies, pulse oximetry, review of old charts and re-evaluation of patient's condition   I assumed direction of critical care for this patient from another provider in my specialty: no   Comments:     Pt in DKA, insulin gtt started.    (including critical care time)  Medications Ordered in ED Medications  insulin regular, human (MYXREDLIN) 100 units/ 100 mL infusion (1.9 Units/hr Intravenous New Bag/Given 08/21/18 1307)  iopamidol (ISOVUE-300) 61 % injection (  Hold 08/21/18 1311)  sodium chloride (PF) 0.9 % injection (0 mLs  Hold 08/21/18 1311)  sodium chloride 0.9 % bolus 1,000 mL (0 mLs Intravenous Stopped 08/21/18 1244)    acetaminophen (TYLENOL) tablet 1,000 mg (1,000 mg Oral Given 08/21/18 1056)  ondansetron (ZOFRAN) injection 4 mg (4 mg Intravenous Given 08/21/18 1057)  potassium chloride 10 mEq in 100 mL IVPB (0 mEq Intravenous Stopped 08/21/18 1259)  iopamidol (ISOVUE-300) 61 % injection 100 mL (100 mLs Intravenous Contrast Given 08/21/18 1317)     Initial Impression / Assessment and Plan / ED Course  I have reviewed the triage vital signs and the nursing notes.  Pertinent labs & imaging results that were available during my care of the patient were reviewed by me and considered in my medical decision making (see chart for details).     Patient presenting for evaluation of nausea, vomiting, and right-sided abdominal pain.  Physical exam shows patient appears dehydrated.  She is tachycardic around 130 with a low-grade fever of 100.  History of diabetes, concern for possible DKA.  Also consider flu, infected gallstone, kidney infection, and appendicitis.  As such, will obtain labs, urine, and CT abdomen pelvis for further evaluation.  Tylenol, Zofran, and fluids started for symptom control.  Lab work concerning for DKA, patient with gap of 20, bicarb of 16, and CBG of 292. DKA protocol started, will give K with insulin.  Ketones in UA with possible UTI. Urine sent for culture, will start abx. White count elevated at 16. Favor DKA over sepsis at this time. vbg pH reassuring at 7.3. CT pending.  On reassessment, patient reports pain is improved.  She is feeling better, heart rate improved to 115.  CT concerning for heterogeneous enhancing mass of the right kidney concerning for renal cell carcinoma.  Also consider possible abscess, although felt to be less likely by the radiologist.  Will consult with urology, and plan for admission to hospitalist service for DKA and  further evaluation. Pt informed of results and plan.   Discussed with Dr. Alinda Money from urology, who favors infection/early abscess over carcinoma at  this time, however will consult while patient is in the hospital.  Discussed with Dr. Tyrell Antonio from [facility], pt to be admitted.    Final Clinical Impressions(s) / ED Diagnoses   Final diagnoses:  Diabetic ketoacidosis without coma associated with type 1 diabetes mellitus ([place])  Urinary tract infection without hematuria, site unspecified  Lesion of right native kidney    ED Discharge Orders    None       Franchot Heidelberg, PA-C 08/21/18 1547    Davonna Belling, MD 08/22/18 1459

## 2018-08-22 DIAGNOSIS — N151 Renal and perinephric abscess: Secondary | ICD-10-CM

## 2018-08-22 DIAGNOSIS — N289 Disorder of kidney and ureter, unspecified: Secondary | ICD-10-CM

## 2018-08-22 LAB — URINE CULTURE

## 2018-08-22 LAB — HEMOGLOBIN A1C
Hgb A1c MFr Bld: 13.3 % — ABNORMAL HIGH (ref 4.8–5.6)
Mean Plasma Glucose: 335.01 mg/dL

## 2018-08-22 LAB — CBG MONITORING, ED
GLUCOSE-CAPILLARY: 139 mg/dL — AB (ref 70–99)
GLUCOSE-CAPILLARY: 154 mg/dL — AB (ref 70–99)
GLUCOSE-CAPILLARY: 167 mg/dL — AB (ref 70–99)
Glucose-Capillary: 113 mg/dL — ABNORMAL HIGH (ref 70–99)
Glucose-Capillary: 114 mg/dL — ABNORMAL HIGH (ref 70–99)
Glucose-Capillary: 122 mg/dL — ABNORMAL HIGH (ref 70–99)
Glucose-Capillary: 123 mg/dL — ABNORMAL HIGH (ref 70–99)
Glucose-Capillary: 141 mg/dL — ABNORMAL HIGH (ref 70–99)
Glucose-Capillary: 148 mg/dL — ABNORMAL HIGH (ref 70–99)
Glucose-Capillary: 178 mg/dL — ABNORMAL HIGH (ref 70–99)
Glucose-Capillary: 184 mg/dL — ABNORMAL HIGH (ref 70–99)
Glucose-Capillary: 197 mg/dL — ABNORMAL HIGH (ref 70–99)
Glucose-Capillary: 245 mg/dL — ABNORMAL HIGH (ref 70–99)

## 2018-08-22 LAB — CBC
HCT: 32.1 % — ABNORMAL LOW (ref 36.0–46.0)
Hemoglobin: 10.5 g/dL — ABNORMAL LOW (ref 12.0–15.0)
MCH: 29 pg (ref 26.0–34.0)
MCHC: 32.7 g/dL (ref 30.0–36.0)
MCV: 88.7 fL (ref 80.0–100.0)
Platelets: 292 10*3/uL (ref 150–400)
RBC: 3.62 MIL/uL — AB (ref 3.87–5.11)
RDW: 13.8 % (ref 11.5–15.5)
WBC: 11.5 10*3/uL — ABNORMAL HIGH (ref 4.0–10.5)
nRBC: 0 % (ref 0.0–0.2)

## 2018-08-22 LAB — RPR: RPR Ser Ql: NONREACTIVE

## 2018-08-22 LAB — COMPREHENSIVE METABOLIC PANEL
ALBUMIN: 2.2 g/dL — AB (ref 3.5–5.0)
ALT: 38 U/L (ref 0–44)
AST: 61 U/L — ABNORMAL HIGH (ref 15–41)
Alkaline Phosphatase: 232 U/L — ABNORMAL HIGH (ref 38–126)
Anion gap: 8 (ref 5–15)
BUN: <5 mg/dL — ABNORMAL LOW (ref 6–20)
CO2: 17 mmol/L — ABNORMAL LOW (ref 22–32)
Calcium: 7.2 mg/dL — ABNORMAL LOW (ref 8.9–10.3)
Chloride: 105 mmol/L (ref 98–111)
Creatinine, Ser: 0.55 mg/dL (ref 0.44–1.00)
GFR calc Af Amer: >60 mL/min (ref >60)
GFR calc non Af Amer: >60 mL/min (ref >60)
GLUCOSE: 231 mg/dL — AB (ref 70–99)
POTASSIUM: 3.8 mmol/L (ref 3.5–5.1)
Sodium: 130 mmol/L — ABNORMAL LOW (ref 135–145)
Total Bilirubin: 0.7 mg/dL (ref 0.3–1.2)
Total Protein: 5.8 g/dL — ABNORMAL LOW (ref 6.5–8.1)

## 2018-08-22 LAB — GLUCOSE, CAPILLARY
GLUCOSE-CAPILLARY: 183 mg/dL — AB (ref 70–99)
Glucose-Capillary: 213 mg/dL — ABNORMAL HIGH (ref 70–99)

## 2018-08-22 LAB — BASIC METABOLIC PANEL
Anion gap: 9 (ref 5–15)
Anion gap: 9 (ref 5–15)
BUN: <5 mg/dL — ABNORMAL LOW (ref 6–20)
BUN: <5 mg/dL — ABNORMAL LOW (ref 6–20)
CALCIUM: 7.6 mg/dL — AB (ref 8.9–10.3)
CO2: 17 mmol/L — ABNORMAL LOW (ref 22–32)
CO2: 17 mmol/L — ABNORMAL LOW (ref 22–32)
Calcium: 7.4 mg/dL — ABNORMAL LOW (ref 8.9–10.3)
Chloride: 102 mmol/L (ref 98–111)
Chloride: 104 mmol/L (ref 98–111)
Creatinine, Ser: 0.52 mg/dL (ref 0.44–1.00)
Creatinine, Ser: 0.55 mg/dL (ref 0.44–1.00)
GFR calc Af Amer: >60 mL/min (ref >60)
GFR calc Af Amer: >60 mL/min (ref >60)
GFR calc non Af Amer: >60 mL/min (ref >60)
GFR calc non Af Amer: >60 mL/min (ref >60)
Glucose, Bld: 130 mg/dL — ABNORMAL HIGH (ref 70–99)
Glucose, Bld: 140 mg/dL — ABNORMAL HIGH (ref 70–99)
Potassium: 3.2 mmol/L — ABNORMAL LOW (ref 3.5–5.1)
Potassium: 3.5 mmol/L (ref 3.5–5.1)
Sodium: 128 mmol/L — ABNORMAL LOW (ref 135–145)
Sodium: 130 mmol/L — ABNORMAL LOW (ref 135–145)

## 2018-08-22 LAB — MAGNESIUM: Magnesium: 1.7 mg/dL (ref 1.7–2.4)

## 2018-08-22 LAB — HIV ANTIBODY (ROUTINE TESTING W REFLEX): HIV SCREEN 4TH GENERATION: NONREACTIVE

## 2018-08-22 MED ORDER — INSULIN ASPART 100 UNIT/ML ~~LOC~~ SOLN: 0.0000 [IU] | Freq: Every day | SUBCUTANEOUS | Status: DC

## 2018-08-22 MED ORDER — INSULIN ASPART 100 UNIT/ML ~~LOC~~ SOLN
0.0000 [IU] | Freq: Three times a day (TID) | SUBCUTANEOUS | Status: DC
  Administered 2018-08-22: 1 [IU] via SUBCUTANEOUS
  Administered 2018-08-22: 3 [IU] via SUBCUTANEOUS
  Administered 2018-08-23: 1 [IU] via SUBCUTANEOUS
  Administered 2018-08-23: 3 [IU] via SUBCUTANEOUS
  Administered 2018-08-23: 1 [IU] via SUBCUTANEOUS
  Administered 2018-08-24: 3 [IU] via SUBCUTANEOUS
  Filled 2018-08-22: qty 1

## 2018-08-22 MED ORDER — SODIUM CHLORIDE 0.9 % IV SOLN
INTRAVENOUS | Status: DC
  Administered 2018-08-22: 12:00:00 via INTRAVENOUS

## 2018-08-22 MED ORDER — POTASSIUM CHLORIDE CRYS ER 20 MEQ PO TBCR
40.0000 meq | EXTENDED_RELEASE_TABLET | Freq: Once | ORAL | Status: AC
  Administered 2018-08-22: 40 meq via ORAL
  Filled 2018-08-22: qty 2

## 2018-08-22 MED ORDER — INSULIN GLARGINE 100 UNIT/ML ~~LOC~~ SOLN
30.0000 [IU] | Freq: Every morning | SUBCUTANEOUS | Status: DC
  Administered 2018-08-22 – 2018-08-24 (×3): 30 [IU] via SUBCUTANEOUS
  Filled 2018-08-22 (×3): qty 0.3

## 2018-08-22 MED ORDER — INSULIN GLARGINE 100 UNIT/ML ~~LOC~~ SOLN
30.0000 [IU] | Freq: Every day | SUBCUTANEOUS | Status: DC
  Filled 2018-08-22: qty 0.3

## 2018-08-22 MED ORDER — SODIUM CHLORIDE 0.9 % IV SOLN: INTRAVENOUS | Status: DC

## 2018-08-22 MED ORDER — POTASSIUM CHLORIDE 10 MEQ/100ML IV SOLN
10.0000 meq | INTRAVENOUS | Status: AC
  Administered 2018-08-22 (×3): 10 meq via INTRAVENOUS
  Filled 2018-08-22 (×3): qty 100

## 2018-08-22 MED ORDER — DEXTROSE 5 % IV SOLN: INTRAVENOUS | Status: DC

## 2018-08-22 MED ORDER — INSULIN ASPART 100 UNIT/ML ~~LOC~~ SOLN: 0.0000 [IU] | SUBCUTANEOUS | Status: DC

## 2018-08-22 MED ORDER — SODIUM CHLORIDE 0.9 % IV BOLUS
1000.0000 mL | Freq: Once | INTRAVENOUS | Status: AC
  Administered 2018-08-22: 1000 mL via INTRAVENOUS

## 2018-08-22 NOTE — Progress Notes (Signed)
Patient ID: Sabrina Sutton, female   DOB: 1996-01-03, 23 y.o.   MRN: [id number]    Subjective: Pt feeling better. Denies any flank or abdominal pain today.  Nausea better.  Objective: Vital signs in last 24 hours: Temp:  [99.9 F (37.7 C)] 99.9 F (37.7 C) (01/30 1320) Pulse Rate:  [104-131] 117 (01/30 1600) Resp:  [12-28] 26 (01/30 1600) BP: (93-125)/(59-90) 119/71 (01/30 1600) SpO2:  [98 %-100 %] 100 % (01/30 1600)  Intake/Output from previous day: 01/29 0701 - 01/30 0700 In: 2586.7 [I.V.:36.7; IV Piggyback:2550] Out: -  Intake/Output this shift: Total I/O In: 2519.6 [I.V.:2419.6; IV Piggyback:100] Out: -   Physical Exam:  General: Alert and oriented Abdomen: Soft, ND, NT, No CVAT Ext: NT, No erythema  Lab Results: Recent Labs    08/21/18 1033 08/22/18 0614  HGB 12.7 10.5*  HCT 38.2 32.1*   CBC Latest Ref Rng & Units 08/22/2018 08/21/2018 05/04/2018  WBC 4.0 - 10.5 K/uL 11.5(H) 16.3(H) 10.9(H)  Hemoglobin 12.0 - 15.0 g/dL 10.5(L) 12.7 12.1  Hematocrit 36.0 - 46.0 % 32.1(L) 38.2 36.1  Platelets 150 - 400 K/uL 292 300 300     BMET Recent Labs    08/22/18 0142 08/22/18 0614  NA 128* 130*  K 3.2* 3.8  CL 102 105  CO2 17* 17*  GLUCOSE 130* 231*  BUN <5* <5*  CREATININE 0.55 0.55  CALCIUM 7.4* 7.2*     Studies/Results:   Assessment/Plan: 1) Right focal pyelonephritis/renal abscess/renal mass:  Pt improving clinically on IV antibiotics.  Continue pending culture results.     LOS: 1 day   Dutch Gray 08/22/2018, 6:11 PM

## 2018-08-22 NOTE — Progress Notes (Signed)
[facility]                                                                              Patient Demographics  Sabrina Sutton, is a 23 y.o. female, DOB - 01/07/1996, BWI:[id number]  Admit date - 08/21/2018   Admitting Physician No admitting provider for patient encounter.  Outpatient Primary MD for the patient is Steve Rattler, DO  Outpatient specialists:   LOS - 1  days   Medical records reviewed and are as summarized below:    Chief Complaint  Patient presents with  . Abdominal Pain  . Emesis  . Diarrhea       Brief summary   Sabrina Sutton is a 23 y.o. female past medical history significant for type 1 diabetes, pyelonephritis, presented with nausea vomiting, right-sided flank pain that is started to 3 days prior to admission.  She had been using her insulin.  She denied dysuria, chest pain, shortness of breath.  In ED, patient was noticed to be tachycardia, febrile, systolic BP in 63A, lactic acid 1.1, pH 7.3, bicarb 16, anion gap 20, CT abdomen pelvis showed 3.4 cm enhancing mass concerning for renal cell CA versus abscess Urology was consulted, patient seen by Dr. Alinda Money, recommended IV antibiotics for likely renal abscess.   Assessment & Plan    Principal Problem:   DKA, type 1 ([place]) -Presented with anion gap of 28, bicarb 16, blood sugar 290 likely capacitated due to pyelonephritis and right renal abscess -CBGs improving less than 1804, gap closed, bicarb improving, will transition to subcu insulin -Started on Lantus 30 units per patient's regimen, NovoLog meal coverage.  -Continue IV fluids until patient is taking solid diet  Active Problems:   Type 1 diabetes mellitus with DKA ([place]) -Follow hemoglobin A1c, transition to subcu insulin    Urinary tract infection without hematuria with right-sided pyelonephritis and abscess,   Sepsis ([place]) -Patient met sepsis criteria at the time of admission with leukocytosis, hypotension, fever,  tachycardia, source likely due to UTI with pyelonephritis -CT abdomen pelvis showed 3.4 cm heterogeneously enhancing mass in the right kidney -Urology consulted per Raynelle Bring, felt imaging consistent with infectious etiology although renal tumor cannot be definitively excluded at this time -Recommended continue broad-spectrum antibiotics, follow urine culture and blood culture results -She will need appropriate antibiotic therapy for 4 to 6 weeks and repeat additional imagings.  If not clinically improving in next 48 to 72 hours, repeat imaging to assess any progression of abscess. -Urine culture showed multiple species, follow blood cultures.  Previous urine cultures in 04/2018 had shown pansensitive E. coli   Code Status: Full CODE STATUS DVT Prophylaxis: Heparin subcu Family Communication: Discussed in detail with the patient, all imaging results, lab results explained to the patient *   Disposition Plan: Once transitioned to subcutaneous insulin, will admit to med telemetry floor, does not need stepdown.  Discussed with RN  Time Spent in minutes   35 minutes  Procedures:  CT abdomen pelvis  Consultants:   Urology  Antimicrobials:   Anti-infectives (From admission, onward)   Start     Dose/Rate Route Frequency Ordered Stop  08/22/18 1500  cefTRIAXone (ROCEPHIN) 2 g in sodium chloride 0.9 % 100 mL IVPB     2 g 200 mL/hr over 30 Minutes Intravenous Every 24 hours 08/21/18 1546     08/21/18 1615  cefTRIAXone (ROCEPHIN) 1 g in sodium chloride 0.9 % 100 mL IVPB     1 g 200 mL/hr over 30 Minutes Intravenous STAT 08/21/18 1608 08/21/18 1809   08/21/18 1600  cefTRIAXone (ROCEPHIN) 1 g in sodium chloride 0.9 % 100 mL IVPB  Status:  Discontinued     1 g 200 mL/hr over 30 Minutes Intravenous  Once 08/21/18 1545 08/21/18 1607   08/21/18 1445  cefTRIAXone (ROCEPHIN) 1 g in sodium chloride 0.9 % 100 mL IVPB     1 g 200 mL/hr over 30 Minutes Intravenous  Once 08/21/18 1434 08/21/18  1655         Medications  Scheduled Meds: . heparin  5,000 Units Subcutaneous Q8H  . insulin aspart  0-5 Units Subcutaneous QHS  . insulin aspart  0-9 Units Subcutaneous TID WC  . insulin glargine  30 Units Subcutaneous q morning - 10a   Continuous Infusions: . sodium chloride 100 mL/hr at 08/22/18 1130  . cefTRIAXone (ROCEPHIN)  IV    . insulin Stopped (08/21/18 1618)   PRN Meds:.acetaminophen **OR** acetaminophen, ondansetron **OR** ondansetron (ZOFRAN) IV      Subjective:   Sabrina Sutton was seen and examined today.  Reports nausea and vomiting x1 episode this morning otherwise starting to feel better.  Patient denies dizziness, chest pain, shortness of breath.  Currently no fever.  Flank pain improving.  Objective:   Vitals:   08/22/18 0900 08/22/18 0915 08/22/18 0930 08/22/18 1000  BP: 118/72  121/77 112/78  Pulse: (!) 118 (!) 119 (!) 125 (!) 119  Resp: (!) 21 (!) 28    Temp:      TempSrc:      SpO2: 99% 98% 100% 100%  Height:        Intake/Output Summary (Last 24 hours) at 08/22/2018 1140 Last data filed at 08/22/2018 1125 Gross per 24 hour  Intake 5006.22 ml  Output -  Net 5006.22 ml     Wt Readings from Last 3 Encounters:  05/04/18 62.1 kg  04/15/18 59.4 kg  04/13/18 59 kg     Exam  General: Alert and oriented x 3, NAD  Eyes: PERRLA, EOMI, Anicteric Sclera,  HEENT:  Atraumatic, normocephalic, normal oropharynx  Cardiovascular: S1 S2 auscultated, sinus tachycardia regular rate and rhythm.  Respiratory: Clear to auscultation bilaterally, no wheezing, rales or rhonchi  Gastrointestinal: Soft, nontender, nondistended, + bowel sounds, no CVAT  Ext: no pedal edema bilaterally  Neuro: no new deficits  Musculoskeletal: No digital cyanosis, clubbing  Skin: No rashes  Psych: Normal affect and demeanor, alert and oriented x3    Data Reviewed:  I have personally reviewed following labs and imaging studies  Micro Results Recent  Results (from the past 240 hour(s))  Urine culture     Status: Abnormal   Collection Time: 08/21/18 12:39 PM  Result Value Ref Range Status   Specimen Description   Final    URINE, CLEAN CATCH Performed at [facility] [address] [place], [place] [address]    Special Requests   Final    NONE Performed at [facility], [place] [address] [place],  [address]    Culture MULTIPLE SPECIES PRESENT, SUGGEST RECOLLECTION (A)  Final   Report Status 08/22/2018 FINAL  Final    Radiology Reports Ct Abdomen Pelvis W Contrast  Result Date: 08/21/2018 CLINICAL DATA:  Acute right-sided abdominal pain. EXAM: CT ABDOMEN AND PELVIS WITH CONTRAST TECHNIQUE: Multidetector CT imaging of the abdomen and pelvis was performed using the standard protocol following bolus administration of intravenous contrast. CONTRAST:  190m ISOVUE-300 IOPAMIDOL (ISOVUE-300) INJECTION 61% COMPARISON:  None. FINDINGS: Lower chest: No acute abnormality. Hepatobiliary: No focal liver abnormality is seen. No gallstones, gallbladder wall thickening, or biliary dilatation. Pancreas: Unremarkable. No pancreatic ductal dilatation or surrounding inflammatory changes. Spleen: Normal in size without focal abnormality. Adrenals/Urinary Tract: Adrenal glands appear normal. 3.4 x 2.8 cm heterogeneously enhancing mass is seen in the posterior portion of the midpole of the right kidney concerning for neoplasm or malignancy. Left kidney demonstrates wedge-shaped low density most consistent with cyst, but other pathology can not be excluded. No hydronephrosis or renal obstruction is noted. Urinary bladder is unremarkable. No renal or ureteral calculi are noted. Stomach/Bowel: Stomach is within normal limits. Appendix appears normal. No evidence of bowel wall thickening, distention, or inflammatory changes. Vascular/Lymphatic: No significant vascular findings are present. No enlarged abdominal or pelvic lymph  nodes. Reproductive: Uterus and left adnexal regions are unremarkable. 3.2 cm right ovarian cyst is noted. Other: No abdominal wall hernia or abnormality. No abdominopelvic ascites. Musculoskeletal: No acute or significant osseous findings. IMPRESSION: 3.4 cm heterogeneously enhancing mass seen in the right kidney concerning for probable renal cell carcinoma. Abscess is less likely. Further evaluation with MRI and urologic consult is recommended. These results were called by telephone at the time of interpretation on 08/21/2018 at 2:13 pm to Dr. SFranchot Heidelberg, who verbally acknowledged these results. Wedge-shaped low density seen in the left kidney which may represent cyst, but other pathology can not be excluded. Further evaluation with ultrasound is recommended. 3.2 cm right ovarian cyst. Electronically Signed   By: JMarijo Conception M.D.   On: 08/21/2018 14:13    Lab Data:  CBC: Recent Labs  Lab 08/21/18 1033 08/22/18 0614  WBC 16.3* 11.5*  HGB 12.7 10.5*  HCT 38.2 32.1*  MCV 90.3 88.7  PLT 300 2212  Basic Metabolic Panel: Recent Labs  Lab 08/21/18 1603 08/21/18 1945 08/21/18 2359 08/22/18 0142 08/22/18 0614  NA 133* 132* 130* 128* 130*  K 3.8 3.6 3.5 3.2* 3.8  CL 104 102 104 102 105  CO2 18* 19* 17* 17* 17*  GLUCOSE 197* 215* 140* 130* 231*  BUN 7 6 <5* <5* <5*  CREATININE 0.70 0.64 0.52 0.55 0.55  CALCIUM 8.0* 8.0* 7.6* 7.4* 7.2*  MG  --   --   --   --  1.7   GFR: CrCl cannot be calculated (Unknown ideal weight.). Liver Function Tests: Recent Labs  Lab 08/21/18 1033 08/22/18 0614  AST 26 61*  ALT 31 38  ALKPHOS 223* 232*  BILITOT 1.7* 0.7  PROT 8.2* 5.8*  ALBUMIN 3.1* 2.2*   Recent Labs  Lab 08/21/18 1033  LIPASE 19   No results for input(s): AMMONIA in the last 168 hours. Coagulation Profile: No results for input(s): INR, PROTIME in the last 168 hours. Cardiac Enzymes: No results for input(s): CKTOTAL, CKMB, CKMBINDEX, TROPONINI in the last 168  hours. BNP (last 3 results) No results for input(s): PROBNP in the last 8760 hours. HbA1C: Recent Labs    08/22/18 0614  HGBA1C 13.3*   CBG: Recent Labs  Lab 08/22/18 0705 08/22/18 0809 08/22/18 0918 08/22/18 1023 08/22/18 1123  GLUCAP 184* 167*  141* 122* 113*   Lipid Profile: No results for input(s): CHOL, HDL, LDLCALC, TRIG, CHOLHDL, LDLDIRECT in the last 72 hours. Thyroid Function Tests: No results for input(s): TSH, T4TOTAL, FREET4, T3FREE, THYROIDAB in the last 72 hours. Anemia Panel: No results for input(s): VITAMINB12, FOLATE, FERRITIN, TIBC, IRON, RETICCTPCT in the last 72 hours. Urine analysis:    Component Value Date/Time   COLORURINE YELLOW 08/21/2018 1239   APPEARANCEUR CLOUDY (A) 08/21/2018 1239   LABSPEC 1.012 08/21/2018 1239   PHURINE 5.0 08/21/2018 1239   GLUCOSEU >=500 (A) 08/21/2018 1239   HGBUR MODERATE (A) 08/21/2018 1239   BILIRUBINUR NEGATIVE 08/21/2018 1239   KETONESUR 80 (A) 08/21/2018 1239   PROTEINUR 30 (A) 08/21/2018 1239   UROBILINOGEN 1.0 08/21/2014 1549   NITRITE NEGATIVE 08/21/2018 1239   LEUKOCYTESUR LARGE (A) 08/21/2018 1239       M.D. [facility] 08/22/2018, 11:40 AM  Pager: [phone] Between 7am to 7pm - call Pager - [phone]  After 7pm go to [email or web address] - password [email or web address]  Call night coverage person covering after 7pm

## 2018-08-22 NOTE — ED Notes (Signed)
Sabrina Najjar, MD notified that bmp has resulted

## 2018-08-22 NOTE — ED Notes (Signed)
ED TO INPATIENT HANDOFF REPORT  Name/Age/Gender Sabrina Sutton 23 y.o. female  Code Status    Code Status Orders  (From admission, onward)         Start     Ordered   08/21/18 1603  Full code  Continuous     08/21/18 1602        Code Status History    Date Active Date Inactive Code Status Order ID Comments User Context   05/03/2018 2327 05/04/2018 1719 Full Code [id number]  Sherene Sires, DO Inpatient   10/23/2017 2037 10/24/2017 2046 Full Code [id number]  Lovenia Kim, MD Inpatient   06/13/2017 1027 06/15/2017 1726 Full Code [id number]  Tonette Bihari, MD ED   04/17/2016 0540 04/18/2016 1823 Full Code [id number]  Veatrice Bourbon, MD ED   09/20/2015 2211 09/25/2015 1853 Full Code [id number]  Montel Clock, MD ED      Home/SNF/Other Home  Chief Complaint Flu Sx  Level of Care/Admitting Diagnosis ED Disposition    ED Disposition Condition [facility] Area: [facility] [[id number]]  Level of Care: Telemetry [5]  Admit to tele based on following criteria: Complex arrhythmia (Bradycardia/Tachycardia)  Diagnosis: Renal abscess [[id number]]  Admitting Physician: Elmarie Shiley [phone]  Attending Physician: RAI, RIPUDEEP K [[id number]]  Estimated length of stay: past midnight tomorrow  Certification:: I certify this patient will need inpatient services for at least 2 midnights  PT Class (Do Not Modify): Inpatient [101]  PT Acc Code (Do Not Modify): Private [1]       Medical History Past Medical History:  Diagnosis Date  . Diabetes mellitus without complication ([place]) 0/37/04   + GAD Ab  . DKA (diabetic ketoacidoses) ([place]) 09/20/2015  . Hypokalemia   . Pyelonephritis 04/17/2016    Allergies No Known Allergies  IV Location/Drains/Wounds Patient Lines/Drains/Airways Status   Active Line/Drains/Airways    Name:   Placement date:   Placement time:   Site:   Days:   Peripheral IV 08/21/18 Right Forearm   08/21/18    1035    Forearm   1    Peripheral IV 08/21/18 Right;Upper Arm   08/21/18    1300    Arm   1          Labs/Imaging Results for orders placed or performed during the hospital encounter of 08/21/18 (from the past 48 hour(s))  Lipase, blood     Status: None   Collection Time: 08/21/18 10:33 AM  Result Value Ref Range   Lipase 19 11 - 51 U/L    Comment: Performed at [facility], [place] [address] [place],  [address]  Comprehensive metabolic panel     Status: Abnormal   Collection Time: 08/21/18 10:33 AM  Result Value Ref Range   Sodium 129 (L) 135 - 145 mmol/L   Potassium 3.5 3.5 - 5.1 mmol/L   Chloride 93 (L) 98 - 111 mmol/L   CO2 16 (L) 22 - 32 mmol/L   Glucose, Bld 292 (H) 70 - 99 mg/dL   BUN 9 6 - 20 mg/dL   Creatinine, Ser 0.94 0.44 - 1.00 mg/dL   Calcium 9.0 8.9 - 10.3 mg/dL   Total Protein 8.2 (H) 6.5 - 8.1 g/dL   Albumin 3.1 (L) 3.5 - 5.0 g/dL   AST 26 15 - 41 U/L   ALT 31 0 - 44 U/L   Alkaline Phosphatase 223 (H) 38 - 126 U/L   Total Bilirubin 1.7 (H) 0.3 -  1.2 mg/dL   GFR calc non Af Amer >60 >60 mL/min   GFR calc Af Amer >60 >60 mL/min   Anion gap 20 (H) 5 - 15    Comment: Performed at [facility], [place] [address] [place], [place] [address]  CBC     Status: Abnormal   Collection Time: 08/21/18 10:33 AM  Result Value Ref Range   WBC 16.3 (H) 4.0 - 10.5 K/uL   RBC 4.23 3.87 - 5.11 MIL/uL   Hemoglobin 12.7 12.0 - 15.0 g/dL   HCT 38.2 36.0 - 46.0 %   MCV 90.3 80.0 - 100.0 fL   MCH 30.0 26.0 - 34.0 pg   MCHC 33.2 30.0 - 36.0 g/dL   RDW 13.6 11.5 - 15.5 %   Platelets 300 150 - 400 K/uL   nRBC 0.0 0.0 - 0.2 %    Comment: Performed at [facility], [place] [address] [place], [place] [address]  POC CBG, ED     Status: Abnormal   Collection Time: 08/21/18 10:37 AM  Result Value Ref Range   Glucose-Capillary 286 (H) 70 - 99 mg/dL  I-Stat beta hCG blood, ED     Status: None   Collection Time: 08/21/18 10:39 AM  Result Value Ref  Range   I-stat hCG, quantitative <5.0 <5 mIU/mL   Comment 3            Comment:   GEST. AGE      CONC.  (mIU/mL)   <=1 WEEK        5 - 50     2 WEEKS       50 - 500     3 WEEKS       100 - 10,000     4 WEEKS     1,000 - 30,000        FEMALE AND NON-PREGNANT FEMALE:     LESS THAN 5 mIU/mL   Influenza panel by PCR (type A & B)     Status: None   Collection Time: 08/21/18 10:39 AM  Result Value Ref Range   Influenza A By PCR NEGATIVE NEGATIVE   Influenza B By PCR NEGATIVE NEGATIVE    Comment: (NOTE) The Xpert Xpress Flu assay is intended as an aid in the diagnosis of  influenza and should not be used as a sole basis for treatment.  This  assay is FDA approved for nasopharyngeal swab specimens only. Nasal  washings and aspirates are unacceptable for Xpert Xpress Flu testing. Performed at [facility], [place] [address], [place], [place] [address]   RPR     Status: None   Collection Time: 08/21/18 10:53 AM  Result Value Ref Range   RPR Ser Ql Non Reactive Non Reactive    Comment: (NOTE) Performed At: [facility] [address] [place], [place] [id number] Rush Farmer MD [id number]   HIV Antibody (routine testing w rflx)     Status: None   Collection Time: 08/21/18 10:53 AM  Result Value Ref Range   HIV Screen 4th Generation wRfx Non Reactive Non Reactive    Comment: (NOTE) Performed At: [facility] [address] [place], [place] [id number] Rush Farmer MD [id number]   Urinalysis, Routine w reflex microscopic     Status: Abnormal   Collection Time: 08/21/18 12:39 PM  Result Value Ref Range   Color, Urine YELLOW YELLOW   APPearance CLOUDY (A) CLEAR   Specific Gravity, Urine 1.012 1.005 - 1.030  pH 5.0 5.0 - 8.0   Glucose, UA >=500 (A) NEGATIVE mg/dL   Hgb urine dipstick MODERATE (A) NEGATIVE   Bilirubin Urine NEGATIVE NEGATIVE   Ketones, ur 80 (A) NEGATIVE mg/dL   Protein, ur 30 (A) NEGATIVE mg/dL   Nitrite NEGATIVE NEGATIVE    Leukocytes, UA LARGE (A) NEGATIVE   RBC / HPF 21-50 0 - 5 RBC/hpf   WBC, UA >50 (H) 0 - 5 WBC/hpf   Bacteria, UA FEW (A) NONE SEEN   Squamous Epithelial / LPF 0-5 0 - 5   WBC Clumps PRESENT    Mucus PRESENT     Comment: Performed at [facility], [place] [address], [place], [place] [address]  Urine culture     Status: Abnormal   Collection Time: 08/21/18 12:39 PM  Result Value Ref Range   Specimen Description      URINE, CLEAN CATCH Performed at [facility], [place] [address], [place], [place] [address]    Special Requests      NONE Performed at [facility], [place] [address], [place], [place] [address]    Culture MULTIPLE SPECIES PRESENT, SUGGEST RECOLLECTION (A)    Report Status 08/22/2018 FINAL   CBG monitoring, ED     Status: Abnormal   Collection Time: 08/21/18 12:53 PM  Result Value Ref Range   Glucose-Capillary 254 (H) 70 - 99 mg/dL  Blood gas, venous     Status: Abnormal   Collection Time: 08/21/18 12:55 PM  Result Value Ref Range   pH, Ven 7.363 7.250 - 7.430   pCO2, Ven 22.7 (L) 44.0 - 60.0 mmHg   pO2, Ven 58.8 (H) 32.0 - 45.0 mmHg   Bicarbonate 12.6 (L) 20.0 - 28.0 mmol/L   Acid-base deficit 11.1 (H) 0.0 - 2.0 mmol/L   O2 Saturation 89.3 %   Patient temperature 98.6    Collection site VEIN    Drawn by COLLECTED BY LABORATORY    Sample type VENOUS     Comment: Performed at [facility], [place] [address], [place], [place] [address]  CBG monitoring, ED     Status: Abnormal   Collection Time: 08/21/18  2:28 PM  Result Value Ref Range   Glucose-Capillary 240 (H) 70 - 99 mg/dL  Lactic acid, plasma     Status: None   Collection Time: 08/21/18  3:04 PM  Result Value Ref Range   Lactic Acid, Venous 1.2 0.5 - 1.9 mmol/L    Comment: Performed at [facility], [place] [address], [place], [place] [address]  CBG monitoring, ED     Status: Abnormal   Collection Time: 08/21/18  3:31 PM   Result Value Ref Range   Glucose-Capillary 158 (H) 70 - 99 mg/dL  Basic metabolic panel     Status: Abnormal   Collection Time: 08/21/18  4:03 PM  Result Value Ref Range   Sodium 133 (L) 135 - 145 mmol/L   Potassium 3.8 3.5 - 5.1 mmol/L   Chloride 104 98 - 111 mmol/L   CO2 18 (L) 22 - 32 mmol/L   Glucose, Bld 197 (H) 70 - 99 mg/dL   BUN 7 6 - 20 mg/dL   Creatinine, Ser 0.70 0.44 - 1.00 mg/dL   Calcium 8.0 (L) 8.9 - 10.3 mg/dL   GFR calc non Af Amer >60 >60 mL/min   GFR calc Af Amer >60 >60 mL/min   Anion gap 11 5 - 15    Comment: Performed at  [facility], [place] [address] [place], [place] [address]  Lactic acid, plasma     Status: None   Collection Time: 08/21/18  4:36 PM  Result Value Ref Range   Lactic Acid, Venous 1.1 0.5 - 1.9 mmol/L    Comment: Performed at [facility], [place] [address], [place], [place] [address]  CBG monitoring, ED     Status: Abnormal   Collection Time: 08/21/18  4:38 PM  Result Value Ref Range   Glucose-Capillary 163 (H) 70 - 99 mg/dL  CBG monitoring, ED     Status: Abnormal   Collection Time: 08/21/18  5:57 PM  Result Value Ref Range   Glucose-Capillary 166 (H) 70 - 99 mg/dL  CBG monitoring, ED     Status: Abnormal   Collection Time: 08/21/18  7:27 PM  Result Value Ref Range   Glucose-Capillary 160 (H) 70 - 99 mg/dL   Comment 1 Notify RN   Basic metabolic panel     Status: Abnormal   Collection Time: 08/21/18  7:45 PM  Result Value Ref Range   Sodium 132 (L) 135 - 145 mmol/L   Potassium 3.6 3.5 - 5.1 mmol/L   Chloride 102 98 - 111 mmol/L   CO2 19 (L) 22 - 32 mmol/L   Glucose, Bld 215 (H) 70 - 99 mg/dL   BUN 6 6 - 20 mg/dL   Creatinine, Ser 0.64 0.44 - 1.00 mg/dL   Calcium 8.0 (L) 8.9 - 10.3 mg/dL   GFR calc non Af Amer >60 >60 mL/min   GFR calc Af Amer >60 >60 mL/min   Anion gap 11 5 - 15    Comment: Performed at [facility], [place] [address], [place], [place] [address]  CBG  monitoring, ED     Status: Abnormal   Collection Time: 08/21/18  8:39 PM  Result Value Ref Range   Glucose-Capillary 184 (H) 70 - 99 mg/dL   Comment 1 Notify RN   CBG monitoring, ED     Status: Abnormal   Collection Time: 08/21/18  9:58 PM  Result Value Ref Range   Glucose-Capillary 170 (H) 70 - 99 mg/dL   Comment 1 Notify RN   CBG monitoring, ED     Status: Abnormal   Collection Time: 08/21/18 11:22 PM  Result Value Ref Range   Glucose-Capillary 149 (H) 70 - 99 mg/dL   Comment 1 Notify RN   Basic metabolic panel     Status: Abnormal   Collection Time: 08/21/18 11:59 PM  Result Value Ref Range   Sodium 130 (L) 135 - 145 mmol/L   Potassium 3.5 3.5 - 5.1 mmol/L   Chloride 104 98 - 111 mmol/L   CO2 17 (L) 22 - 32 mmol/L   Glucose, Bld 140 (H) 70 - 99 mg/dL   BUN <5 (L) 6 - 20 mg/dL   Creatinine, Ser 0.52 0.44 - 1.00 mg/dL   Calcium 7.6 (L) 8.9 - 10.3 mg/dL   GFR calc non Af Amer >60 >60 mL/min   GFR calc Af Amer >60 >60 mL/min   Anion gap 9 5 - 15    Comment: Performed at [facility], [place] [address], [place], [place] [address]  CBG monitoring, ED     Status: Abnormal   Collection Time: 08/22/18 12:53 AM  Result Value Ref Range   Glucose-Capillary 123 (H) 70 - 99 mg/dL   Comment 1 Notify RN   CBG monitoring, ED  Status: Abnormal   Collection Time: 08/22/18  1:35 AM  Result Value Ref Range   Glucose-Capillary 114 (H) 70 - 99 mg/dL  Basic metabolic panel     Status: Abnormal   Collection Time: 08/22/18  1:42 AM  Result Value Ref Range   Sodium 128 (L) 135 - 145 mmol/L   Potassium 3.2 (L) 3.5 - 5.1 mmol/L   Chloride 102 98 - 111 mmol/L   CO2 17 (L) 22 - 32 mmol/L   Glucose, Bld 130 (H) 70 - 99 mg/dL   BUN <5 (L) 6 - 20 mg/dL   Creatinine, Ser 0.55 0.44 - 1.00 mg/dL   Calcium 7.4 (L) 8.9 - 10.3 mg/dL   GFR calc non Af Amer >60 >60 mL/min   GFR calc Af Amer >60 >60 mL/min   Anion gap 9 5 - 15    Comment: Performed at [facility],  [place] [address], [place], [place] [address]  CBG monitoring, ED     Status: Abnormal   Collection Time: 08/22/18  2:45 AM  Result Value Ref Range   Glucose-Capillary 139 (H) 70 - 99 mg/dL   Comment 1 Notify RN   CBG monitoring, ED     Status: Abnormal   Collection Time: 08/22/18  3:49 AM  Result Value Ref Range   Glucose-Capillary 154 (H) 70 - 99 mg/dL  CBG monitoring, ED     Status: Abnormal   Collection Time: 08/22/18  4:53 AM  Result Value Ref Range   Glucose-Capillary 178 (H) 70 - 99 mg/dL  CBG monitoring, ED     Status: Abnormal   Collection Time: 08/22/18  6:08 AM  Result Value Ref Range   Glucose-Capillary 197 (H) 70 - 99 mg/dL  Comprehensive metabolic panel     Status: Abnormal   Collection Time: 08/22/18  6:14 AM  Result Value Ref Range   Sodium 130 (L) 135 - 145 mmol/L   Potassium 3.8 3.5 - 5.1 mmol/L   Chloride 105 98 - 111 mmol/L   CO2 17 (L) 22 - 32 mmol/L   Glucose, Bld 231 (H) 70 - 99 mg/dL   BUN <5 (L) 6 - 20 mg/dL   Creatinine, Ser 0.55 0.44 - 1.00 mg/dL   Calcium 7.2 (L) 8.9 - 10.3 mg/dL   Total Protein 5.8 (L) 6.5 - 8.1 g/dL   Albumin 2.2 (L) 3.5 - 5.0 g/dL   AST 61 (H) 15 - 41 U/L   ALT 38 0 - 44 U/L   Alkaline Phosphatase 232 (H) 38 - 126 U/L   Total Bilirubin 0.7 0.3 - 1.2 mg/dL   GFR calc non Af Amer >60 >60 mL/min   GFR calc Af Amer >60 >60 mL/min   Anion gap 8 5 - 15    Comment: Performed at [facility], [place] [address] [place], [place] [address]  Magnesium     Status: None   Collection Time: 08/22/18  6:14 AM  Result Value Ref Range   Magnesium 1.7 1.7 - 2.4 mg/dL    Comment: Performed at [facility], [place] [address], [place], [place] [address]  CBC     Status: Abnormal   Collection Time: 08/22/18  6:14 AM  Result Value Ref Range   WBC 11.5 (H) 4.0 - 10.5 K/uL   RBC 3.62 (L) 3.87 - 5.11 MIL/uL   Hemoglobin 10.5 (L) 12.0 - 15.0 g/dL   HCT 32.1 (L) 36.0 - 46.0 %   MCV 88.7 80.0 -  100.0 fL   MCH 29.0  26.0 - 34.0 pg   MCHC 32.7 30.0 - 36.0 g/dL   RDW 13.8 11.5 - 15.5 %   Platelets 292 150 - 400 K/uL   nRBC 0.0 0.0 - 0.2 %    Comment: Performed at [facility], [place] [address] [place], [place] [address]  Hemoglobin A1c     Status: Abnormal   Collection Time: 08/22/18  6:14 AM  Result Value Ref Range   Hgb A1c MFr Bld 13.3 (H) 4.8 - 5.6 %    Comment: (NOTE) Pre diabetes:          5.7%-6.4% Diabetes:              >6.4% Glycemic control for   <7.0% adults with diabetes    Mean Plasma Glucose 335.01 mg/dL    Comment: Performed at [place] [address], [place], [place] [address]  CBG monitoring, ED     Status: Abnormal   Collection Time: 08/22/18  7:05 AM  Result Value Ref Range   Glucose-Capillary 184 (H) 70 - 99 mg/dL  CBG monitoring, ED     Status: Abnormal   Collection Time: 08/22/18  8:09 AM  Result Value Ref Range   Glucose-Capillary 167 (H) 70 - 99 mg/dL  CBG monitoring, ED     Status: Abnormal   Collection Time: 08/22/18  9:18 AM  Result Value Ref Range   Glucose-Capillary 141 (H) 70 - 99 mg/dL  CBG monitoring, ED     Status: Abnormal   Collection Time: 08/22/18 10:23 AM  Result Value Ref Range   Glucose-Capillary 122 (H) 70 - 99 mg/dL  CBG monitoring, ED     Status: Abnormal   Collection Time: 08/22/18 11:23 AM  Result Value Ref Range   Glucose-Capillary 113 (H) 70 - 99 mg/dL  CBG monitoring, ED     Status: Abnormal   Collection Time: 08/22/18 12:43 PM  Result Value Ref Range   Glucose-Capillary 148 (H) 70 - 99 mg/dL  CBG monitoring, ED     Status: Abnormal   Collection Time: 08/22/18  4:43 PM  Result Value Ref Range   Glucose-Capillary 245 (H) 70 - 99 mg/dL   Ct Abdomen Pelvis W Contrast  Result Date: 08/21/2018 CLINICAL DATA:  Acute right-sided abdominal pain. EXAM: CT ABDOMEN AND PELVIS WITH CONTRAST TECHNIQUE: Multidetector CT imaging of the abdomen and pelvis was performed using the standard protocol following bolus  administration of intravenous contrast. CONTRAST:  142mL ISOVUE-300 IOPAMIDOL (ISOVUE-300) INJECTION 61% COMPARISON:  None. FINDINGS: Lower chest: No acute abnormality. Hepatobiliary: No focal liver abnormality is seen. No gallstones, gallbladder wall thickening, or biliary dilatation. Pancreas: Unremarkable. No pancreatic ductal dilatation or surrounding inflammatory changes. Spleen: Normal in size without focal abnormality. Adrenals/Urinary Tract: Adrenal glands appear normal. 3.4 x 2.8 cm heterogeneously enhancing mass is seen in the posterior portion of the midpole of the right kidney concerning for neoplasm or malignancy. Left kidney demonstrates wedge-shaped low density most consistent with cyst, but other pathology can not be excluded. No hydronephrosis or renal obstruction is noted. Urinary bladder is unremarkable. No renal or ureteral calculi are noted. Stomach/Bowel: Stomach is within normal limits. Appendix appears normal. No evidence of bowel wall thickening, distention, or inflammatory changes. Vascular/Lymphatic: No significant vascular findings are present. No enlarged abdominal or pelvic lymph nodes. Reproductive: Uterus and left adnexal regions are unremarkable. 3.2 cm right ovarian cyst is noted. Other: No abdominal wall hernia or abnormality. No abdominopelvic  ascites. Musculoskeletal: No acute or significant osseous findings. IMPRESSION: 3.4 cm heterogeneously enhancing mass seen in the right kidney concerning for probable renal cell carcinoma. Abscess is less likely. Further evaluation with MRI and urologic consult is recommended. These results were called by telephone at the time of interpretation on 08/21/2018 at 2:13 pm to Dr. Franchot Heidelberg , who verbally acknowledged these results. Wedge-shaped low density seen in the left kidney which may represent cyst, but other pathology can not be excluded. Further evaluation with ultrasound is recommended. 3.2 cm right ovarian cyst. Electronically  Signed   By: Marijo Conception, M.D.   On: 08/21/2018 14:13    Pending Labs Unresulted Labs (From admission, onward)    Start     Ordered   08/22/18 1147  Urine Culture  ONCE - STAT,   R    Comments:  Need repeat urine culture    08/22/18 1147   08/21/18 1532  Culture, blood (routine x 2)  BLOOD CULTURE X 2,   R     08/21/18 1531          Vitals/Pain Today's Vitals   08/22/18 1515 08/22/18 1530 08/22/18 1545 08/22/18 1600  BP:  121/81  119/71  Pulse: (!) 116 (!) 113 (!) 114 (!) 117  Resp: (!) 25 (!) 22 (!) 24 (!) 26  Temp:      TempSrc:      SpO2: 99% 99% 100% 100%  Height:      PainSc:        Isolation Precautions No active isolations  Medications Medications  iopamidol (ISOVUE-300) 61 % injection (  Hold 08/21/18 1503)  sodium chloride (PF) 0.9 % injection (0 mLs  Hold 08/21/18 1503)  cefTRIAXone (ROCEPHIN) 2 g in sodium chloride 0.9 % 100 mL IVPB (0 g Intravenous Stopped 08/22/18 1747)  ondansetron (ZOFRAN) tablet 4 mg (has no administration in time range)    Or  ondansetron (ZOFRAN) injection 4 mg (has no administration in time range)  acetaminophen (TYLENOL) tablet 650 mg (650 mg Oral Given 08/22/18 0101)    Or  acetaminophen (TYLENOL) suppository 650 mg ( Rectal See Alternative 08/22/18 0101)  heparin injection 5,000 Units (5,000 Units Subcutaneous Given 08/22/18 1636)  potassium chloride 10 mEq in 100 mL IVPB ( Intravenous Canceled Entry 08/22/18 0800)  potassium chloride 10 mEq in 100 mL IVPB (10 mEq Intravenous Not Given 08/21/18 2141)  potassium chloride 10 mEq in 100 mL IVPB ( Intravenous Canceled Entry 08/22/18 1346)  insulin glargine (LANTUS) injection 30 Units (30 Units Subcutaneous Given 08/22/18 0922)  insulin aspart (novoLOG) injection 0-5 Units (has no administration in time range)  insulin aspart (novoLOG) injection 0-9 Units (1 Units Subcutaneous Given 08/22/18 1256)  0.9 %  sodium chloride infusion ( Intravenous New Bag/Given 08/22/18 1130)  sodium chloride  0.9 % bolus 1,000 mL (0 mLs Intravenous Stopped 08/21/18 1244)  acetaminophen (TYLENOL) tablet 1,000 mg (1,000 mg Oral Given 08/21/18 1056)  ondansetron (ZOFRAN) injection 4 mg (4 mg Intravenous Given 08/21/18 1057)  potassium chloride 10 mEq in 100 mL IVPB (0 mEq Intravenous Stopped 08/21/18 1259)  iopamidol (ISOVUE-300) 61 % injection 100 mL (100 mLs Intravenous Contrast Given 08/21/18 1317)  cefTRIAXone (ROCEPHIN) 1 g in sodium chloride 0.9 % 100 mL IVPB (0 g Intravenous Stopped 08/21/18 1655)  sodium chloride 0.9 % bolus 1,000 mL (0 mLs Intravenous Stopped 08/21/18 1655)  cefTRIAXone (ROCEPHIN) 1 g in sodium chloride 0.9 % 100 mL IVPB (0 g Intravenous Stopped 08/21/18 1809)  potassium chloride SA (K-DUR,KLOR-CON) CR tablet 40 mEq (40 mEq Oral Given 08/22/18 0256)  sodium chloride 0.9 % bolus 1,000 mL (0 mLs Intravenous Stopped 08/22/18 0438)    Mobility walks

## 2018-08-22 NOTE — ED Notes (Signed)
Pt. CBG 197, RN, Judson Roch made aware.

## 2018-08-22 NOTE — ED Notes (Signed)
CBG 167 

## 2018-08-22 NOTE — ED Notes (Signed)
Pt.  CBG 178, RN, Judson Roch made aware.

## 2018-08-22 NOTE — ED Notes (Addendum)
Pt reports emesis x4. Clear bile looking liquid.

## 2018-08-22 NOTE — ED Notes (Signed)
Pt. CBG 123, RN,Sarah made aware.

## 2018-08-22 NOTE — ED Notes (Signed)
Pt. CBG 139, RN, Judson Roch made aware.

## 2018-08-22 NOTE — ED Notes (Signed)
Pt stated that are "in a little bit of pain"

## 2018-08-22 NOTE — Progress Notes (Signed)
Patient received from ED.  Stable at time of transfer.  Telemetry placed, verified with CMT.  Ordered dinner for patient.  Patient oriented to unit and equipment.  Will continue to monitor.

## 2018-08-23 LAB — URINE CULTURE: Culture: NO GROWTH

## 2018-08-23 LAB — GLUCOSE, CAPILLARY
Glucose-Capillary: 130 mg/dL — ABNORMAL HIGH (ref 70–99)
Glucose-Capillary: 225 mg/dL — ABNORMAL HIGH (ref 70–99)
Glucose-Capillary: 141 mg/dL — ABNORMAL HIGH (ref 70–99)
Glucose-Capillary: 123 mg/dL — ABNORMAL HIGH (ref 70–99)

## 2018-08-23 LAB — GC/CHLAMYDIA PROBE AMP (~~LOC~~) NOT AT ARMC
Chlamydia: NEGATIVE
Neisseria Gonorrhea: NEGATIVE

## 2018-08-23 MED ORDER — INSULIN ASPART 100 UNIT/ML ~~LOC~~ SOLN
10.0000 [IU] | Freq: Three times a day (TID) | SUBCUTANEOUS | Status: DC
  Administered 2018-08-23 – 2018-08-24 (×3): 10 [IU] via SUBCUTANEOUS

## 2018-08-23 MED ORDER — INSULIN ASPART 100 UNIT/ML ~~LOC~~ SOLN: 5.0000 [IU] | Freq: Three times a day (TID) | SUBCUTANEOUS | Status: DC

## 2018-08-23 NOTE — Progress Notes (Signed)
Nutrition Brief Note  Patient identified on the Malnutrition Screening Tool (MST) Report  Wt Readings from Last 15 Encounters:  08/22/18 60.9 kg  05/04/18 62.1 kg  04/15/18 59.4 kg  04/13/18 59 kg  10/31/17 59.4 kg (55 %, Z= 0.12)*  10/23/17 59 kg (53 %, Z= 0.08)*  06/15/17 58.6 kg (53 %, Z= 0.07)*  08/05/16 59 kg (58 %, Z= 0.21)*  05/09/16 60.3 kg (64 %, Z= 0.37)*  04/27/16 64.9 kg (77 %, Z= 0.75)*  04/21/16 61 kg (67 %, Z= 0.43)*  04/17/16 56.1 kg (48 %, Z= -0.06)*  11/19/15 63.8 kg (76 %, Z= 0.71)*  10/20/15 61.1 kg (69 %, Z= 0.50)*  10/15/15 61.8 kg (71 %, Z= 0.55)*   * Growth percentiles are based on CDC (Girls, 2-20 Years) data.   MST filled by nursing. Patient reports good at home PO, 3 meals/day and denies recent changes in wt. Patient recognizes when experiencing high/low blood sugar and how to respond.   Body mass index is 21.03 kg/m. Patient meets criteria for WDL based on current BMI.   Current diet order is CHO Mod, patient is consuming approximately 100% of meals at this time. Labs and medications reviewed.   No nutrition interventions warranted at this time. If nutrition issues arise, please consult RD.   Lajuan Lines, RD, LDN  After Hours/Weekend Pager: [phone]

## 2018-08-23 NOTE — Progress Notes (Signed)
Inpatient Diabetes Program Recommendations  AACE/ADA: New Consensus Statement on Inpatient Glycemic Control (2015)  Target Ranges:  Prepandial:   less than 140 mg/dL      Peak postprandial:   less than 180 mg/dL (1-2 hours)      Critically ill patients:  140 - 180 mg/dL   Lab Results  Component Value Date   GLUCAP 130 (H) 08/23/2018   HGBA1C 13.3 (H) 08/22/2018    Review of Glycemic Control  Diabetes history: DM1 Outpatient Diabetes medications: Lantus 30 units ac Breakfast, Novolog 10 units tidwc Current orders for Inpatient glycemic control: Lantus 30 units QAM, Novolog 0-9 units tidwc and hs + 10 units tidwc  HgbA1C - 13.3% Spoke with pt regarding her HgbA1C and diabetes control at home. Pt eating 75% meal tray. Pt states she wants an insulin pump but was told that Medicare would not pay for it. Has not been checking blood sugars and taking insulin as ordered. Pt states she needs to "get back on track" and take care of herself. Last visit with PCP was 04/15/2018.  Inpatient Diabetes Program Recommendations:     Agree with orders.  Long discussion with pt regarding her HgbA1C and glucose control at home. Pt "does not like sticking herself" and would love to have an insulin pump, although worried that insurance would not cover it. Recommended pt talk with PCP about requirements for an insulin pump and insurance info on co-pays. Also recommended pt to talk with PCP about a continuous glucose monitor such as Elenor Legato and she states she will do that. Stressed importance of checking blood sugars and taking insulin as prescribed to prevent complications from high blood sugars. Very pleasant and appreciative of information.  Continue to follow.  Thank you. Lorenda Peck, RD, LDN, CDE Inpatient Diabetes Coordinator [phone]

## 2018-08-23 NOTE — Progress Notes (Signed)
[facility]                                                                              Patient Demographics  Sabrina Sutton, is a 23 y.o. female, DOB - Mar 18, 1996, GOT:[id number]  Admit date - 08/21/2018   Admitting Physician Elmarie Shiley, MD  Outpatient Primary MD for the patient is Steve Rattler, DO  Outpatient specialists:   LOS - 2  days   Medical records reviewed and are as summarized below:    Chief Complaint  Patient presents with  . Abdominal Pain  . Emesis  . Diarrhea       Brief summary   Sabrina Sutton is a 23 y.o. female past medical history significant for type 1 diabetes, pyelonephritis, presented with nausea vomiting, right-sided flank pain that is started to 3 days prior to admission.  She had been using her insulin.  She denied dysuria, chest pain, shortness of breath.  In ED, patient was noticed to be tachycardia, febrile, systolic BP in 59R, lactic acid 1.1, pH 7.3, bicarb 16, anion gap 20, CT abdomen pelvis showed 3.4 cm enhancing mass concerning for renal cell CA versus abscess Urology was consulted, patient seen by Dr. Alinda Money, recommended IV antibiotics for likely renal abscess.   Assessment & Plan    Principal Problem:   DKA, type 1 ([place]) with a history of type 1 diabetes mellitus, uncontrolled -Presented with anion gap of 28, bicarb 16, blood sugar 290 3 was precipitated due to pyelonephritis and abscess. -Patient has been transitioned to subcu insulin, Lantus 30 units daily, added NovoLog 10 units 3 times daily AC, continue sliding scale insulin  -Hemoglobin A1c 13.3.  Diabetic coordinator and dietitian consulted -Patient reports that she had not been taking her insulin as prescribed as she does not like poking herself and she tries to "avoid it".  Recommended to talk to her PCP regarding insulin pump, per patient, she is afraid that Medicaid will not cover it.   Active Problems:    Urinary tract infection without  hematuria with right-sided pyelonephritis and abscess,   Sepsis ([place]) -Patient met sepsis criteria at the time of admission with leukocytosis, hypotension, fever, tachycardia, source likely due to UTI with pyelonephritis -CT abdomen pelvis showed 3.4 cm heterogeneously enhancing mass in the right kidney -Urology consulted per Raynelle Bring, felt imaging consistent with infectious etiology although renal tumor cannot be definitively excluded at this time -Recommended continue broad-spectrum antibiotics, follow urine culture and blood culture results -She will need appropriate antibiotic therapy for 4 to 6 weeks and repeat additional imagings.  If not clinically improving in next 48 to 72 hours, repeat imaging to assess any progression of abscess. -Initial urine culture showed multiple species, blood cultures negative so far, repeat urine culture pending - Previous urine cultures in 04/2018 had shown pansensitive E. coli.  For now continue IV Rocephin.    Code Status: Full CODE STATUS DVT Prophylaxis: Heparin subcu Family Communication: Discussed in detail with the patient, all imaging results, lab results explained to the patient    Disposition Plan: Urine culture pending, if no fevers and sensitivities available, hopefully DC  home tomorrow, will await urology recommendations  Time Spent in minutes   25 minutes  Procedures:  CT abdomen pelvis  Consultants:   Urology  Antimicrobials:   Anti-infectives (From admission, onward)   Start     Dose/Rate Route Frequency Ordered Stop   08/22/18 1500  cefTRIAXone (ROCEPHIN) 2 g in sodium chloride 0.9 % 100 mL IVPB     2 g 200 mL/hr over 30 Minutes Intravenous Every 24 hours 08/21/18 1546     08/21/18 1615  cefTRIAXone (ROCEPHIN) 1 g in sodium chloride 0.9 % 100 mL IVPB     1 g 200 mL/hr over 30 Minutes Intravenous STAT 08/21/18 1608 08/21/18 1809   08/21/18 1600  cefTRIAXone (ROCEPHIN) 1 g in sodium chloride 0.9 % 100 mL IVPB  Status:   Discontinued     1 g 200 mL/hr over 30 Minutes Intravenous  Once 08/21/18 1545 08/21/18 1607   08/21/18 1445  cefTRIAXone (ROCEPHIN) 1 g in sodium chloride 0.9 % 100 mL IVPB     1 g 200 mL/hr over 30 Minutes Intravenous  Once 08/21/18 1434 08/21/18 1655         Medications  Scheduled Meds: . heparin  5,000 Units Subcutaneous Q8H  . insulin aspart  0-5 Units Subcutaneous QHS  . insulin aspart  0-9 Units Subcutaneous TID WC  . insulin aspart  10 Units Subcutaneous TID WC  . insulin glargine  30 Units Subcutaneous q morning - 10a   Continuous Infusions: . sodium chloride Stopped (08/22/18 1826)  . cefTRIAXone (ROCEPHIN)  IV Stopped (08/22/18 1747)   PRN Meds:.acetaminophen **OR** acetaminophen, ondansetron **OR** ondansetron (ZOFRAN) IV      Subjective:   Sabrina Sutton was seen and examined today.  Overall starting to feel better, no fevers or chills, no nausea or vomiting.  Flank pain improving. Patient denies dizziness, chest pain, shortness of breath.   Objective:   Vitals:   08/22/18 2314 08/22/18 2315 08/23/18 0203 08/23/18 0642  BP:   107/72 119/69  Pulse:   (!) 106 (!) 112  Resp:   18 18  Temp: 98.3 F (36.8 C) 98.3 F (36.8 C) 98 F (36.7 C) 99.3 F (37.4 C)  TempSrc: Oral Oral Oral Oral  SpO2:   100% 100%  Weight:      Height:        Intake/Output Summary (Last 24 hours) at 08/23/2018 1258 Last data filed at 08/23/2018 0900 Gross per 24 hour  Intake 940 ml  Output 4 ml  Net 936 ml     Wt Readings from Last 3 Encounters:  08/22/18 60.9 kg  05/04/18 62.1 kg  04/15/18 59.4 kg    Physical Exam  General: Alert and oriented x 3, NAD  Eyes:  HEENT:    Cardiovascular: S1 S2 clear, RRR. No pedal edema b/l  Respiratory: CTAB, no wheezing, rales or rhonchi  Gastrointestinal: Soft, nontender, nondistended, NBS  Ext: no pedal edema bilaterally  Neuro: no new deficits  Musculoskeletal: No cyanosis, clubbing  Skin: No rashes  Psych:  Normal affect and demeanor, alert and oriented x3   Data Reviewed:  I have personally reviewed following labs and imaging studies  Micro Results Recent Results (from the past 240 hour(s))  Urine culture     Status: Abnormal   Collection Time: 08/21/18 12:39 PM  Result Value Ref Range Status   Specimen Description   Final    URINE, CLEAN CATCH Performed at [facility] [address] [place], [place]  [address]    Special Requests   Final    NONE Performed at [facility], [place] [address], [place], [place] [address]    Culture MULTIPLE SPECIES PRESENT, SUGGEST RECOLLECTION (A)  Final   Report Status 08/22/2018 FINAL  Final  Culture, blood (routine x 2)     Status: None (Preliminary result)   Collection Time: 08/21/18  3:32 PM  Result Value Ref Range Status   Specimen Description   Final    BLOOD RIGHT ARM Performed at [facility] [address] [place], [place] [address]    Special Requests   Final    BOTTLES DRAWN AEROBIC AND ANAEROBIC Blood Culture adequate volume Performed at [facility] [address], [place], [place] [address]    Culture   Final    NO GROWTH 1 DAY Performed at [facility], [place] [address] [place], [place] [address]    Report Status PENDING  Incomplete  Culture, blood (routine x 2)     Status: None (Preliminary result)   Collection Time: 08/21/18  3:37 PM  Result Value Ref Range Status   Specimen Description   Final    BLOOD LEFT ANTECUBITAL Performed at [facility] [address], [place], [place] [address]    Special Requests   Final    BOTTLES DRAWN AEROBIC AND ANAEROBIC Blood Culture adequate volume Performed at [facility] [address] [place], [place] [address]    Culture   Final    NO GROWTH 1 DAY Performed at [facility], [place] [address], [place], [place] [address]    Report Status PENDING  Incomplete      Radiology Reports Ct Abdomen Pelvis W Contrast  Result Date: 08/21/2018 CLINICAL DATA:  Acute right-sided abdominal pain. EXAM: CT ABDOMEN AND PELVIS WITH CONTRAST TECHNIQUE: Multidetector CT imaging of the abdomen and pelvis was performed using the standard protocol following bolus administration of intravenous contrast. CONTRAST:  142m ISOVUE-300 IOPAMIDOL (ISOVUE-300) INJECTION 61% COMPARISON:  None. FINDINGS: Lower chest: No acute abnormality. Hepatobiliary: No focal liver abnormality is seen. No gallstones, gallbladder wall thickening, or biliary dilatation. Pancreas: Unremarkable. No pancreatic ductal dilatation or surrounding inflammatory changes. Spleen: Normal in size without focal abnormality. Adrenals/Urinary Tract: Adrenal glands appear normal. 3.4 x 2.8 cm heterogeneously enhancing mass is seen in the posterior portion of the midpole of the right kidney concerning for neoplasm or malignancy. Left kidney demonstrates wedge-shaped low density most consistent with cyst, but other pathology can not be excluded. No hydronephrosis or renal obstruction is noted. Urinary bladder is unremarkable. No renal or ureteral calculi are noted. Stomach/Bowel: Stomach is within normal limits. Appendix appears normal. No evidence of bowel wall thickening, distention, or inflammatory changes. Vascular/Lymphatic: No significant vascular findings are present. No enlarged abdominal or pelvic lymph nodes. Reproductive: Uterus and left adnexal regions are unremarkable. 3.2 cm right ovarian cyst is noted. Other: No abdominal wall hernia or abnormality. No abdominopelvic ascites. Musculoskeletal: No acute or significant osseous findings. IMPRESSION: 3.4 cm heterogeneously enhancing mass seen in the right kidney concerning for probable renal cell carcinoma. Abscess is less likely. Further evaluation with MRI and urologic consult is recommended. These results were called by telephone at the time of interpretation on  08/21/2018 at 2:13 pm to Dr. SFranchot Heidelberg, who verbally acknowledged these results. Wedge-shaped low density seen in the left kidney which may represent cyst, but other pathology can not be excluded. Further evaluation with  ultrasound is recommended. 3.2 cm right ovarian cyst. Electronically Signed   By: Marijo Conception, M.D.   On: 08/21/2018 14:13    Lab Data:  CBC: Recent Labs  Lab 08/21/18 1033 08/22/18 0614  WBC 16.3* 11.5*  HGB 12.7 10.5*  HCT 38.2 32.1*  MCV 90.3 88.7  PLT 300 131   Basic Metabolic Panel: Recent Labs  Lab 08/21/18 1603 08/21/18 1945 08/21/18 2359 08/22/18 0142 08/22/18 0614  NA 133* 132* 130* 128* 130*  K 3.8 3.6 3.5 3.2* 3.8  CL 104 102 104 102 105  CO2 18* 19* 17* 17* 17*  GLUCOSE 197* 215* 140* 130* 231*  BUN 7 6 <5* <5* <5*  CREATININE 0.70 0.64 0.52 0.55 0.55  CALCIUM 8.0* 8.0* 7.6* 7.4* 7.2*  MG  --   --   --   --  1.7   GFR: Estimated Creatinine Clearance: 107.8 mL/min (by C-G formula based on SCr of 0.55 mg/dL). Liver Function Tests: Recent Labs  Lab 08/21/18 1033 08/22/18 0614  AST 26 61*  ALT 31 38  ALKPHOS 223* 232*  BILITOT 1.7* 0.7  PROT 8.2* 5.8*  ALBUMIN 3.1* 2.2*   Recent Labs  Lab 08/21/18 1033  LIPASE 19   No results for input(s): AMMONIA in the last 168 hours. Coagulation Profile: No results for input(s): INR, PROTIME in the last 168 hours. Cardiac Enzymes: No results for input(s): CKTOTAL, CKMB, CKMBINDEX, TROPONINI in the last 168 hours. BNP (last 3 results) No results for input(s): PROBNP in the last 8760 hours. HbA1C: Recent Labs    08/22/18 0614  HGBA1C 13.3*   CBG: Recent Labs  Lab 08/22/18 1643 08/22/18 1823 08/22/18 2021 08/23/18 0757 08/23/18 1237  GLUCAP 245* 213* 183* 130* 225*   Lipid Profile: No results for input(s): CHOL, HDL, LDLCALC, TRIG, CHOLHDL, LDLDIRECT in the last 72 hours. Thyroid Function Tests: No results for input(s): TSH, T4TOTAL, FREET4, T3FREE, THYROIDAB in the  last 72 hours. Anemia Panel: No results for input(s): VITAMINB12, FOLATE, FERRITIN, TIBC, IRON, RETICCTPCT in the last 72 hours. Urine analysis:    Component Value Date/Time   COLORURINE YELLOW 08/21/2018 1239   APPEARANCEUR CLOUDY (A) 08/21/2018 1239   LABSPEC 1.012 08/21/2018 1239   PHURINE 5.0 08/21/2018 1239   GLUCOSEU >=500 (A) 08/21/2018 1239   HGBUR MODERATE (A) 08/21/2018 1239   BILIRUBINUR NEGATIVE 08/21/2018 1239   KETONESUR 80 (A) 08/21/2018 1239   PROTEINUR 30 (A) 08/21/2018 1239   UROBILINOGEN 1.0 08/21/2014 1549   NITRITE NEGATIVE 08/21/2018 1239   LEUKOCYTESUR LARGE (A) 08/21/2018 1239     Boleslaus Holloway M.D. [facility] 08/23/2018, 12:58 PM  Pager: [phone] Between 7am to 7pm - call Pager - [phone]  After 7pm go to [email or web address] - password [email or web address]  Call night coverage person covering after 7pm

## 2018-08-23 NOTE — Progress Notes (Signed)
Patient ID: Sabrina Sutton, female   DOB: 1995-08-12, 23 y.o.   MRN: [id number]    Subjective: She continues to deny any pain over last 24 hrs.  Low grade fever yesterday.  Objective: Vital signs in last 24 hours: Temp:  [98 F (36.7 C)-100.6 F (38.1 C)] 98.9 F (37.2 C) (01/31 1433) Pulse Rate:  [106-119] 117 (01/31 1433) Resp:  [15-20] 15 (01/31 1433) BP: (107-130)/(69-84) 121/76 (01/31 1433) SpO2:  [99 %-100 %] 99 % (01/31 1433) Weight:  [60.9 kg] 60.9 kg (01/30 2023)  Intake/Output from previous day: 01/30 0701 - 01/31 0700 In: 2999.6 [P.O.:480; I.V.:2419.6; IV Piggyback:100] Out: 4 [Urine:3; Stool:1] Intake/Output this shift: Total I/O In: 726 [P.O.:720; I.V.:6] Out: -   Physical Exam:  General: Alert and oriented CV: RRR Lungs: Clear Abdomen: Soft, ND Incisions: Ext: NT, No erythema  Lab Results: Recent Labs    08/21/18 1033 08/22/18 0614  HGB 12.7 10.5*  HCT 38.2 32.1*   CBC Latest Ref Rng & Units 08/22/2018 08/21/2018 05/04/2018  WBC 4.0 - 10.5 K/uL 11.5(H) 16.3(H) 10.9(H)  Hemoglobin 12.0 - 15.0 g/dL 10.5(L) 12.7 12.1  Hematocrit 36.0 - 46.0 % 32.1(L) 38.2 36.1  Platelets 150 - 400 K/uL 292 300 300     BMET Recent Labs    08/22/18 0142 08/22/18 0614  NA 128* 130*  K 3.2* 3.8  CL 102 105  CO2 17* 17*  GLUCOSE 130* 231*  BUN <5* <5*  CREATININE 0.55 0.55  CALCIUM 7.4* 7.2*     Studies/Results: Urine culture negative  Assessment/Plan: Right renal mass (pyelo/abscess vs tumor): If blood cultures prove to be negative, I would recommend discharging patient on 1 month of lipid soluble antibiotic based on last urine culture (TMP/SMX or ciprofloxacin) as long as she remains afebrile.  I will then arrange outpatient follow up at that time with repeat imaging of the kidney.    LOS: 2 days   Dutch Gray 08/23/2018, 5:05 PM

## 2018-08-23 NOTE — Progress Notes (Signed)
Assumed care of patient at this time. Patient is stable with no complaints at this time. Agree with previously documented assessment. Will continue to monitor patient.   

## 2018-08-24 DIAGNOSIS — E1065 Type 1 diabetes mellitus with hyperglycemia: Secondary | ICD-10-CM

## 2018-08-24 LAB — GLUCOSE, CAPILLARY
Glucose-Capillary: 208 mg/dL — ABNORMAL HIGH (ref 70–99)
Glucose-Capillary: 188 mg/dL — ABNORMAL HIGH (ref 70–99)

## 2018-08-24 MED ORDER — GLUCAGON (RDNA) 1 MG IJ KIT: PACK | INTRAMUSCULAR | 2 refills | Status: DC

## 2018-08-24 MED ORDER — ACETAMINOPHEN 325 MG PO TABS: 650.0000 mg | ORAL_TABLET | Freq: Four times a day (QID) | ORAL | 0 refills | Status: DC | PRN

## 2018-08-24 MED ORDER — ONDANSETRON 4 MG PO TBDP: 4.0000 mg | ORAL_TABLET | Freq: Four times a day (QID) | ORAL | 0 refills | Status: DC | PRN

## 2018-08-24 MED ORDER — CIPROFLOXACIN HCL 500 MG PO TABS: 500.0000 mg | ORAL_TABLET | Freq: Two times a day (BID) | ORAL | 0 refills | Status: AC

## 2018-08-24 NOTE — Discharge Summary (Signed)
Physician Discharge Summary   Patient ID: Sabrina Sutton MRN: [id number] DOB/AGE: 26-Nov-1995 23 y.o.  Admit date: 08/21/2018 Discharge date: 08/24/2018  Primary Care Physician:  Sabrina Rattler, DO   Recommendations for Outpatient Follow-up:  1. Follow up with PCP in 1-2 weeks 2. Patient will follow-up with urology, office will arrange, will need repeat imaging to ensure resolution of the renal abscess 3. Per urology recommendations, placed on ciprofloxacin for a month  Home Health: None Equipment/Devices:   Discharge Condition: stable CODE STATUS: FULL  Diet recommendation: Carb modified diet   Discharge Diagnoses:    . Type 1 diabetes mellitus uncontrolled with DKA . Sepsis ([place]) . UTI with right-sided pyelonephritis . Renal abscess   Consults: Urology, Dr. Alinda Money    Allergies:  No Known Allergies   DISCHARGE MEDICATIONS: Allergies as of 08/24/2018   No Known Allergies     Medication List    TAKE these medications   ACCU-CHEK FASTCLIX LANCETS Misc Check sugar 10 x daily   accu-chek soft touch lancets Use as directed.   acetaminophen 325 MG tablet Commonly known as:  TYLENOL Take 2 tablets (650 mg total) by mouth every 6 (six) hours as needed for mild pain or fever (over the counter).   acetone (urine) test strip Check ketones per protocol   Alcohol Pads 70 % Pads Use to wipe skin prior to insulin injections twice daily   ciprofloxacin 500 MG tablet Commonly known as:  CIPRO Take 1 tablet (500 mg total) by mouth 2 (two) times daily for 30 days.   glucagon 1 MG injection Inject 60m IM if unconscious, seizing, or unable to eat to correct low blood sugar   glucose blood test strip Commonly known as:  ACCU-CHEK AVIVA PLUS Please check blood sugars 4 times a day   ACCU-CHEK AVIVA PLUS test strip Generic drug:  glucose blood USE AS DIRECTED   Insulin Pen Needle 31G X 5 MM Misc Commonly known as:  B-D UF III MINI PEN NEEDLES CHECK BLOOD SUGAR  IN THE MORNING BEFORE EATING, BEFORE EACH MEAL, AND AS NEEDED   Insulin Pen Needle 31G X 5 MM Misc Commonly known as:  B-D UF III MINI PEN NEEDLES USE TO CHECK BLOOD SUGAR IN THE MORNING BEFORE EATING, BEFORE EACH MEAL, AND AS NEEDED   LANTUS SOLOSTAR 100 UNIT/ML Solostar Pen Generic drug:  Insulin Glargine INJECT 30 UNITS UNDER THE SKIN DAILY BEFORE BREAKFAST What changed:  See the new instructions.   NOVOLOG FLEXPEN 100 UNIT/ML FlexPen Generic drug:  insulin aspart ADMINISTER 10 UNITS UNDER THE SKIN THREE TIMES DAILY BEFORE MEALS What changed:  See the new instructions.   ondansetron 4 MG disintegrating tablet Commonly known as:  ZOFRAN-ODT Take 1 tablet (4 mg total) by mouth every 6 (six) hours as needed for nausea or vomiting.        Brief H and P: For complete details please refer to admission H and P, but in brief Sabrina Sutton a 23y.o.femalepast medical history significant for type 1 diabetes, pyelonephritis, presented with nausea vomiting, right-sided flank pain that is started to 3 days prior to admission. She had been using her insulin. She denied dysuria, chest pain, shortness of breath.  In ED, patient was noticed to be tachycardia, febrile, systolic BP in 982N lactic acid 1.1, pH 7.3, bicarb 16, anion gap 20, CT abdomen pelvis showed 3.4 cm enhancing mass concerning for renal cell CA versus abscess Urology was consulted, patient seen by Dr. BAlinda Money recommended IV antibiotics  for likely renal abscess.   Hospital Course:  DKA, type 1 ([place]) with a history of type 1 diabetes mellitus, uncontrolled -Presented with anion gap of 28, bicarb 16, blood sugar 290, likely DKA.was precipitated due to pyelonephritis and abscess, noncompliance with insulin. -Patient has been transitioned to subcu insulin, Lantus 30 units daily, added NovoLog 10 units 3 times daily AC and sliding scale insulin -Hemoglobin A1c 13.3.  Diabetic coordinator and dietitian was  consulted -Patient reported that she had not been taking her insulin as prescribed as she does not like poking herself and she tries to "avoid it".  Recommended to talk to her PCP regarding insulin pump and Libre continuous glucose monitor     Urinary tract infection without hematuria with right-sided pyelonephritis and abscess, Sepsis ([place]) -Patient met sepsis criteria at the time of admission with leukocytosis, hypotension, fever, tachycardia, source likely due to UTI with pyelonephritis -CT abdomen pelvis showed 3.4 cm heterogeneously enhancing mass in the right kidney -Neurology was consulted and patient was seen by Dr. Alinda Money who felt that imaging was consistent with infectious etiology although renal tumor cannot be definitively excluded at this time -Blood cultures were negative, initial urine culture showed multiple species, repeat urine culture showed no growth -- Previous urine cultures in 04/2018 had shown pansensitive E. Coli -Patient was placed on IV Rocephin, transition to oral ciprofloxacin for a month, follow closely outpatient with urology, will need repeat imaging  Day of Discharge S: Feels good, no fevers or chills, wants to go home, tolerating diet.  BP 124/83 (BP Location: Left Arm)   Pulse 88   Temp 98.7 F (37.1 C) (Oral)   Resp 14   Ht '5\' 7"'  (1.702 m)   Wt 60.9 kg   LMP 07/25/2018 Comment: negative HCG blood test 08-21-2018  SpO2 100%   BMI 21.03 kg/m   Physical Exam: General: Alert and awake oriented x3 not in any acute distress. HEENT: anicteric sclera, pupils reactive to light and accommodation CVS: S1-S2 clear no murmur rubs or gallops Chest: clear to auscultation bilaterally, no wheezing rales or rhonchi Abdomen: soft nontender, nondistended, normal bowel sounds Extremities: no cyanosis, clubbing or edema noted bilaterally Neuro: Cranial nerves II-XII intact, no focal neurological deficits   The results of significant diagnostics from this  hospitalization (including imaging, microbiology, ancillary and laboratory) are listed below for reference.      Procedures/Studies:  Ct Abdomen Pelvis W Contrast  Result Date: 08/21/2018 CLINICAL DATA:  Acute right-sided abdominal pain. EXAM: CT ABDOMEN AND PELVIS WITH CONTRAST TECHNIQUE: Multidetector CT imaging of the abdomen and pelvis was performed using the standard protocol following bolus administration of intravenous contrast. CONTRAST:  151m ISOVUE-300 IOPAMIDOL (ISOVUE-300) INJECTION 61% COMPARISON:  None. FINDINGS: Lower chest: No acute abnormality. Hepatobiliary: No focal liver abnormality is seen. No gallstones, gallbladder wall thickening, or biliary dilatation. Pancreas: Unremarkable. No pancreatic ductal dilatation or surrounding inflammatory changes. Spleen: Normal in size without focal abnormality. Adrenals/Urinary Tract: Adrenal glands appear normal. 3.4 x 2.8 cm heterogeneously enhancing mass is seen in the posterior portion of the midpole of the right kidney concerning for neoplasm or malignancy. Left kidney demonstrates wedge-shaped low density most consistent with cyst, but other pathology can not be excluded. No hydronephrosis or renal obstruction is noted. Urinary bladder is unremarkable. No renal or ureteral calculi are noted. Stomach/Bowel: Stomach is within normal limits. Appendix appears normal. No evidence of bowel wall thickening, distention, or inflammatory changes. Vascular/Lymphatic: No significant vascular findings are present. No enlarged abdominal or  pelvic lymph nodes. Reproductive: Uterus and left adnexal regions are unremarkable. 3.2 cm right ovarian cyst is noted. Other: No abdominal wall hernia or abnormality. No abdominopelvic ascites. Musculoskeletal: No acute or significant osseous findings. IMPRESSION: 3.4 cm heterogeneously enhancing mass seen in the right kidney concerning for probable renal cell carcinoma. Abscess is less likely. Further evaluation with MRI  and urologic consult is recommended. These results were called by telephone at the time of interpretation on 08/21/2018 at 2:13 pm to Dr. Franchot Heidelberg , who verbally acknowledged these results. Wedge-shaped low density seen in the left kidney which may represent cyst, but other pathology can not be excluded. Further evaluation with ultrasound is recommended. 3.2 cm right ovarian cyst. Electronically Signed   By: Marijo Conception, M.D.   On: 08/21/2018 14:13       LAB RESULTS: Basic Metabolic Panel: Recent Labs  Lab 08/22/18 0142 08/22/18 0614  NA 128* 130*  K 3.2* 3.8  CL 102 105  CO2 17* 17*  GLUCOSE 130* 231*  BUN <5* <5*  CREATININE 0.55 0.55  CALCIUM 7.4* 7.2*  MG  --  1.7   Liver Function Tests: Recent Labs  Lab 08/21/18 1033 08/22/18 0614  AST 26 61*  ALT 31 38  ALKPHOS 223* 232*  BILITOT 1.7* 0.7  PROT 8.2* 5.8*  ALBUMIN 3.1* 2.2*   Recent Labs  Lab 08/21/18 1033  LIPASE 19   No results for input(s): AMMONIA in the last 168 hours. CBC: Recent Labs  Lab 08/21/18 1033 08/22/18 0614  WBC 16.3* 11.5*  HGB 12.7 10.5*  HCT 38.2 32.1*  MCV 90.3 88.7  PLT 300 292   Cardiac Enzymes: No results for input(s): CKTOTAL, CKMB, CKMBINDEX, TROPONINI in the last 168 hours. BNP: Invalid input(s): POCBNP CBG: Recent Labs  Lab 08/24/18 0739 08/24/18 1104  GLUCAP 208* 188*      Disposition and Follow-up: Discharge Instructions    Diet Carb Modified   Complete by:  As directed    Discharge instructions   Complete by:  As directed    It is VERY IMPORTANT that you follow up with a PCP on a regular basis.  Check your blood glucoses before each meal and at bedtime and maintain a log of your readings.  Bring this log with you when you follow up with your PCP so that he or she can adjust your insulin at your follow up visit.   Increase activity slowly   Complete by:  As directed        DISPOSITION: [place]     Raynelle Bring, MD Follow up.   Specialty:  Urology Why:  We will call you to set up appt Contact information: [place] [place] [address] [place], [place], [place]. Schedule an appointment as soon as possible for a visit in 2 week(s).   Specialty:  Family Medicine Contact information: [place] [address] [phone]            Time coordinating discharge:  35 minutes  Signed:   Estill Cotta M.D. [facility] 08/24/2018, 11:32 AM Pager: [phone]

## 2018-08-24 NOTE — Progress Notes (Signed)
Subjective: Patient feels much better.  No pain, no shakes or chills.  She is eating well.  Objective: Vital signs in last 24 hours: Temp:  [98.5 F (36.9 C)-98.9 F (37.2 C)] 98.7 F (37.1 C) (02/01 0521) Pulse Rate:  [88-117] 88 (02/01 0521) Resp:  [14-16] 14 (02/01 0521) BP: (117-128)/(72-83) 124/83 (02/01 0521) SpO2:  [99 %-100 %] 100 % (02/01 0521)  Intake/Output from previous day: 01/31 0701 - 02/01 0700 In: 826 [P.O.:720; I.V.:6; IV Piggyback:100] Out: -  Intake/Output this shift: No intake/output data recorded.  Physical Exam:  Constitutional: Vital signs reviewed. WD WN in NAD   Eyes: PERRL, No scleral icterus.   Cardiovascular: RRR Pulmonary/Chest: Normal effort   Lab Results: Recent Labs    08/21/18 1033 08/22/18 0614  HGB 12.7 10.5*  HCT 38.2 32.1*   BMET Recent Labs    08/22/18 0142 08/22/18 0614  NA 128* 130*  K 3.2* 3.8  CL 102 105  CO2 17* 17*  GLUCOSE 130* 231*  BUN <5* <5*  CREATININE 0.55 0.55  CALCIUM 7.4* 7.2*   No results for input(s): LABPT, INR in the last 72 hours. No results for input(s): LABURIN in the last 72 hours. Results for orders placed or performed during the hospital encounter of 08/21/18  Urine culture     Status: Abnormal   Collection Time: 08/21/18 12:39 PM  Result Value Ref Range Status   Specimen Description   Final    URINE, CLEAN CATCH Performed at [facility], [place] [address], [place], [place] [address]    Special Requests   Final    NONE Performed at [facility], [place] [address] [place], [place] [address]    Culture MULTIPLE SPECIES PRESENT, SUGGEST RECOLLECTION (A)  Final   Report Status 08/22/2018 FINAL  Final  Culture, blood (routine x 2)     Status: None (Preliminary result)   Collection Time: 08/21/18  3:32 PM  Result Value Ref Range Status   Specimen Description BLOOD RIGHT ARM  Final   Special Requests   Final    BOTTLES DRAWN AEROBIC AND ANAEROBIC Blood  Culture adequate volume Performed at [facility] [address] [place], [place] [address]    Culture NO GROWTH 2 DAYS  Final   Report Status PENDING  Incomplete  Culture, blood (routine x 2)     Status: None (Preliminary result)   Collection Time: 08/21/18  3:37 PM  Result Value Ref Range Status   Specimen Description BLOOD LEFT ANTECUBITAL  Final   Special Requests   Final    BOTTLES DRAWN AEROBIC AND ANAEROBIC Blood Culture adequate volume Performed at [facility] [address] [place], [place] [address]    Culture NO GROWTH 2 DAYS  Final   Report Status PENDING  Incomplete  Urine Culture     Status: None   Collection Time: 08/22/18  5:10 PM  Result Value Ref Range Status   Specimen Description URINE, CLEAN CATCH  Final   Special Requests   Final    NONE Performed at [facility], [place] [address], [place], [place] [address]    Culture NO GROWTH  Final   Report Status 08/23/2018 FINAL  Final    Studies/Results: No results found.  Assessment/Plan:   Right renal mass, infectious source versus tumor.  Clinically improving.    Urine/blood cultures negative.  Recommend sending home with 1 month of Cipro or Bactrim.  We will arrange follow-up  in our office.    Okay for DC from urologic standpoint   LOS: 3 days   Jorja Loa 08/24/2018, 9:00 AM

## 2018-08-27 LAB — CULTURE, BLOOD (ROUTINE X 2)
Culture: NO GROWTH
Culture: NO GROWTH
Special Requests: ADEQUATE
Special Requests: ADEQUATE

## 2018-09-13 ENCOUNTER — Ambulatory Visit: Payer: Medicaid Other | Admitting: Family Medicine

## 2018-09-30 ENCOUNTER — Other Ambulatory Visit: Payer: Self-pay | Admitting: Family Medicine

## 2018-10-04 ENCOUNTER — Ambulatory Visit: Payer: Medicaid Other | Admitting: Family Medicine

## 2018-10-13 ENCOUNTER — Encounter (HOSPITAL_COMMUNITY): Payer: Self-pay

## 2018-10-13 ENCOUNTER — Other Ambulatory Visit: Payer: Self-pay

## 2018-10-13 ENCOUNTER — Inpatient Hospital Stay (HOSPITAL_COMMUNITY)
Admission: EM | Admit: 2018-10-13 | Discharge: 2018-10-15 | DRG: 638 | Disposition: A | Discharge status: Home / Self Care | Payer: Medicaid Other | Attending: Internal Medicine | Admitting: Internal Medicine

## 2018-10-13 DIAGNOSIS — R7989 Other specified abnormal findings of blood chemistry: Secondary | ICD-10-CM | POA: Diagnosis not present

## 2018-10-13 DIAGNOSIS — E109 Type 1 diabetes mellitus without complications: Secondary | ICD-10-CM

## 2018-10-13 DIAGNOSIS — Z87448 Personal history of other diseases of urinary system: Secondary | ICD-10-CM | POA: Diagnosis present

## 2018-10-13 DIAGNOSIS — E876 Hypokalemia: Secondary | ICD-10-CM | POA: Diagnosis present

## 2018-10-13 DIAGNOSIS — N10 Acute tubulo-interstitial nephritis: Secondary | ICD-10-CM

## 2018-10-13 DIAGNOSIS — E1010 Type 1 diabetes mellitus with ketoacidosis without coma: Secondary | ICD-10-CM | POA: Diagnosis not present

## 2018-10-13 DIAGNOSIS — Z7722 Contact with and (suspected) exposure to environmental tobacco smoke (acute) (chronic): Secondary | ICD-10-CM | POA: Diagnosis present

## 2018-10-13 DIAGNOSIS — Z833 Family history of diabetes mellitus: Secondary | ICD-10-CM

## 2018-10-13 DIAGNOSIS — Z794 Long term (current) use of insulin: Secondary | ICD-10-CM

## 2018-10-13 DIAGNOSIS — R778 Other specified abnormalities of plasma proteins: Secondary | ICD-10-CM | POA: Diagnosis present

## 2018-10-13 DIAGNOSIS — R112 Nausea with vomiting, unspecified: Secondary | ICD-10-CM | POA: Diagnosis present

## 2018-10-13 DIAGNOSIS — E108 Type 1 diabetes mellitus with unspecified complications: Secondary | ICD-10-CM | POA: Diagnosis present

## 2018-10-13 DIAGNOSIS — Z66 Do not resuscitate: Secondary | ICD-10-CM | POA: Diagnosis present

## 2018-10-13 DIAGNOSIS — Z9114 Patient's other noncompliance with medication regimen: Secondary | ICD-10-CM

## 2018-10-13 DIAGNOSIS — F1290 Cannabis use, unspecified, uncomplicated: Secondary | ICD-10-CM | POA: Diagnosis present

## 2018-10-13 DIAGNOSIS — R1110 Vomiting, unspecified: Secondary | ICD-10-CM

## 2018-10-13 DIAGNOSIS — N12 Tubulo-interstitial nephritis, not specified as acute or chronic: Secondary | ICD-10-CM | POA: Diagnosis present

## 2018-10-13 DIAGNOSIS — N83201 Unspecified ovarian cyst, right side: Secondary | ICD-10-CM | POA: Diagnosis present

## 2018-10-13 HISTORY — DX: Acute pyelonephritis: N10

## 2018-10-13 HISTORY — DX: Hypokalemia: E87.6

## 2018-10-13 LAB — CBC WITH DIFFERENTIAL/PLATELET
Abs Immature Granulocytes: 0.04 10*3/uL (ref 0.00–0.07)
Basophils Absolute: 0 10*3/uL (ref 0.0–0.1)
Basophils Relative: 1 %
Eosinophils Absolute: 0 10*3/uL (ref 0.0–0.5)
Eosinophils Relative: 0 %
HCT: 45.2 % (ref 36.0–46.0)
HEMOGLOBIN: 15.9 g/dL — AB (ref 12.0–15.0)
Immature Granulocytes: 1 %
LYMPHS PCT: 28 %
Lymphs Abs: 2 10*3/uL (ref 0.7–4.0)
MCH: 28.9 pg (ref 26.0–34.0)
MCHC: 35.2 g/dL (ref 30.0–36.0)
MCV: 82 fL (ref 80.0–100.0)
Monocytes Absolute: 0.4 10*3/uL (ref 0.1–1.0)
Monocytes Relative: 5 %
NEUTROS ABS: 4.6 10*3/uL (ref 1.7–7.7)
Neutrophils Relative %: 65 %
Platelets: 340 10*3/uL (ref 150–400)
RBC: 5.51 MIL/uL — AB (ref 3.87–5.11)
RDW: 12.5 % (ref 11.5–15.5)
WBC: 7.1 10*3/uL (ref 4.0–10.5)
nRBC: 0 % (ref 0.0–0.2)

## 2018-10-13 LAB — URINALYSIS, ROUTINE W REFLEX MICROSCOPIC
Bacteria, UA: NONE SEEN
Glucose, UA: 150 mg/dL — AB
Ketones, ur: 80 mg/dL — AB
Nitrite: NEGATIVE
PROTEIN: 100 mg/dL — AB
RBC / HPF: >50 RBC/hpf — ABNORMAL HIGH (ref 0–5)
Specific Gravity, Urine: 1.03 (ref 1.005–1.030)
WBC, UA: >50 WBC/hpf — ABNORMAL HIGH (ref 0–5)
pH: 5 (ref 5.0–8.0)

## 2018-10-13 LAB — POCT I-STAT EG7
Acid-base deficit: 4 mmol/L — ABNORMAL HIGH (ref 0.0–2.0)
Bicarbonate: 20.7 mmol/L (ref 20.0–28.0)
CALCIUM ION: 1.16 mmol/L (ref 1.15–1.40)
HCT: 48 % — ABNORMAL HIGH (ref 36.0–46.0)
Hemoglobin: 16.3 g/dL — ABNORMAL HIGH (ref 12.0–15.0)
O2 Saturation: 32 %
Potassium: 3.3 mmol/L — ABNORMAL LOW (ref 3.5–5.1)
Sodium: 137 mmol/L (ref 135–145)
TCO2: 22 mmol/L (ref 22–32)
pCO2, Ven: 35.5 mmHg — ABNORMAL LOW (ref 44.0–60.0)
pH, Ven: 7.374 (ref 7.250–7.430)
pO2, Ven: 20 mmHg — CL (ref 32.0–45.0)

## 2018-10-13 LAB — HCG, QUANTITATIVE, PREGNANCY: hCG, Beta Chain, Quant, S: <1 m[IU]/mL (ref <5)

## 2018-10-13 LAB — COMPREHENSIVE METABOLIC PANEL
ALT: 53 U/L — ABNORMAL HIGH (ref 0–44)
AST: 104 U/L — ABNORMAL HIGH (ref 15–41)
Albumin: 4.4 g/dL (ref 3.5–5.0)
Alkaline Phosphatase: 109 U/L (ref 38–126)
Anion gap: 18 — ABNORMAL HIGH (ref 5–15)
BUN: 16 mg/dL (ref 6–20)
CHLORIDE: 99 mmol/L (ref 98–111)
CO2: 18 mmol/L — ABNORMAL LOW (ref 22–32)
Calcium: 9.9 mg/dL (ref 8.9–10.3)
Creatinine, Ser: 0.85 mg/dL (ref 0.44–1.00)
GFR calc non Af Amer: >60 mL/min (ref >60)
Glucose, Bld: 163 mg/dL — ABNORMAL HIGH (ref 70–99)
Potassium: 3.3 mmol/L — ABNORMAL LOW (ref 3.5–5.1)
Sodium: 135 mmol/L (ref 135–145)
Total Bilirubin: 1 mg/dL (ref 0.3–1.2)
Total Protein: 8.5 g/dL — ABNORMAL HIGH (ref 6.5–8.1)

## 2018-10-13 LAB — BASIC METABOLIC PANEL
Anion gap: 20 — ABNORMAL HIGH (ref 5–15)
BUN: 11 mg/dL (ref 6–20)
CO2: 12 mmol/L — ABNORMAL LOW (ref 22–32)
Calcium: 8.7 mg/dL — ABNORMAL LOW (ref 8.9–10.3)
Chloride: 101 mmol/L (ref 98–111)
Creatinine, Ser: 0.98 mg/dL (ref 0.44–1.00)
GFR calc Af Amer: >60 mL/min (ref >60)
GFR calc non Af Amer: >60 mL/min (ref >60)
Glucose, Bld: 177 mg/dL — ABNORMAL HIGH (ref 70–99)
Potassium: 3.3 mmol/L — ABNORMAL LOW (ref 3.5–5.1)
Sodium: 133 mmol/L — ABNORMAL LOW (ref 135–145)

## 2018-10-13 LAB — CBG MONITORING, ED
Glucose-Capillary: 143 mg/dL — ABNORMAL HIGH (ref 70–99)
Glucose-Capillary: 170 mg/dL — ABNORMAL HIGH (ref 70–99)

## 2018-10-13 LAB — I-STAT TROPONIN, ED: Troponin i, poc: 0.13 ng/mL (ref 0.00–0.08)

## 2018-10-13 LAB — MAGNESIUM: Magnesium: 1.6 mg/dL — ABNORMAL LOW (ref 1.7–2.4)

## 2018-10-13 LAB — GLUCOSE, CAPILLARY: Glucose-Capillary: 168 mg/dL — ABNORMAL HIGH (ref 70–99)

## 2018-10-13 LAB — TROPONIN I: Troponin I: <0.03 ng/mL (ref <0.03)

## 2018-10-13 LAB — LIPASE, BLOOD: Lipase: 23 U/L (ref 11–51)

## 2018-10-13 MED ORDER — INSULIN REGULAR(HUMAN) IN NACL 100-0.9 UT/100ML-% IV SOLN
INTRAVENOUS | Status: DC
  Administered 2018-10-13: 1.1 [IU]/h via INTRAVENOUS
  Filled 2018-10-13: qty 100

## 2018-10-13 MED ORDER — PROMETHAZINE HCL 25 MG/ML IJ SOLN
12.5000 mg | Freq: Once | INTRAMUSCULAR | Status: AC
  Administered 2018-10-14: 12.5 mg via INTRAVENOUS
  Filled 2018-10-13: qty 1

## 2018-10-13 MED ORDER — SODIUM CHLORIDE 0.9 % IV SOLN: INTRAVENOUS | Status: AC

## 2018-10-13 MED ORDER — SODIUM CHLORIDE 0.9 % IV BOLUS
1000.0000 mL | Freq: Once | INTRAVENOUS | Status: AC
  Administered 2018-10-13: 1000 mL via INTRAVENOUS

## 2018-10-13 MED ORDER — ACETAMINOPHEN 325 MG PO TABS: 650.0000 mg | ORAL_TABLET | Freq: Four times a day (QID) | ORAL | Status: DC | PRN

## 2018-10-13 MED ORDER — ONDANSETRON HCL 4 MG/2ML IJ SOLN
4.0000 mg | Freq: Once | INTRAMUSCULAR | Status: AC
  Administered 2018-10-13: 4 mg via INTRAVENOUS
  Filled 2018-10-13: qty 2

## 2018-10-13 MED ORDER — DEXTROSE-NACL 5-0.45 % IV SOLN
INTRAVENOUS | Status: DC
  Administered 2018-10-13 – 2018-10-14 (×3): via INTRAVENOUS

## 2018-10-13 MED ORDER — POTASSIUM CHLORIDE 10 MEQ/100ML IV SOLN
10.0000 meq | INTRAVENOUS | Status: AC
  Administered 2018-10-13 – 2018-10-14 (×4): 10 meq via INTRAVENOUS
  Filled 2018-10-13: qty 100

## 2018-10-13 MED ORDER — ENOXAPARIN SODIUM 40 MG/0.4ML ~~LOC~~ SOLN
40.0000 mg | SUBCUTANEOUS | Status: DC
  Administered 2018-10-14: 40 mg via SUBCUTANEOUS
  Filled 2018-10-13 (×2): qty 0.4

## 2018-10-13 MED ORDER — POTASSIUM CHLORIDE 10 MEQ/100ML IV SOLN
INTRAVENOUS | Status: AC
  Administered 2018-10-13: 10 meq via INTRAVENOUS
  Filled 2018-10-13: qty 100

## 2018-10-13 MED ORDER — SODIUM CHLORIDE 0.9 % IV SOLN
1.0000 g | INTRAVENOUS | Status: DC
  Administered 2018-10-13 – 2018-10-14 (×2): 1 g via INTRAVENOUS
  Filled 2018-10-13 (×2): qty 10

## 2018-10-13 MED ORDER — SODIUM CHLORIDE 0.9 % IV SOLN: INTRAVENOUS | Status: DC

## 2018-10-13 MED ORDER — POTASSIUM CHLORIDE 10 MEQ/100ML IV SOLN
10.0000 meq | Freq: Once | INTRAVENOUS | Status: AC
  Administered 2018-10-13: 10 meq via INTRAVENOUS
  Filled 2018-10-13: qty 100

## 2018-10-13 MED ORDER — ASPIRIN EC 81 MG PO TBEC
81.0000 mg | DELAYED_RELEASE_TABLET | Freq: Every day | ORAL | Status: DC
  Administered 2018-10-15: 81 mg via ORAL
  Filled 2018-10-13: qty 1

## 2018-10-13 MED ORDER — METOCLOPRAMIDE HCL 5 MG/ML IJ SOLN
10.0000 mg | Freq: Once | INTRAMUSCULAR | Status: AC
  Administered 2018-10-13: 10 mg via INTRAVENOUS
  Filled 2018-10-13: qty 2

## 2018-10-13 MED ORDER — ONDANSETRON HCL 4 MG/2ML IJ SOLN
4.0000 mg | Freq: Three times a day (TID) | INTRAMUSCULAR | Status: DC | PRN
  Administered 2018-10-13: 4 mg via INTRAVENOUS
  Filled 2018-10-13: qty 2

## 2018-10-13 NOTE — ED Provider Notes (Signed)
[facility] EMERGENCY DEPARTMENT Provider Note   CSN: [id number] Arrival date & time: 10/13/18  1535    History   Chief Complaint Chief Complaint  Patient presents with  . Nausea  . Emesis    HPI Sabrina Sutton is a 23 y.o. female with a PMH of type 1 Diabetes, DKA, and Pyelonephritis presenting with nausea and vomiting onset 2 days ago. Mother is a contributing historian. Mother reports patient does not like to take her insulin and did not take her insulin on Friday. Mother states her blood glucose was not able to be read by the machine on Saturday because it was elevated. Patient reports generalized abdominal pain and reports multiple episodes of vomiting. Patient reports she took zofran at 12pm today with minimal relief. Patient denies fever, cough, congestion, diarrhea, dysuria, sick contacts, or recent travel. Patient reports marijuana use and tobacco use. Patient denies alcohol use. Mother reports last meal was on Friday night. Mother reports patient takes 30 units of Lantus at night and 10 units of Novolog with meals.     HPI  Past Medical History:  Diagnosis Date  . Diabetes mellitus without complication ([place]) 6/60/63   + GAD Ab  . DKA (diabetic ketoacidoses) ([place]) 09/20/2015  . Hypokalemia   . Pyelonephritis 04/17/2016    Patient Active Problem List   Diagnosis Date Noted  . Acute pyelonephritis 10/13/2018  . Elevated troponin 10/13/2018  . Hypokalemia 10/13/2018  . Nausea & vomiting 10/13/2018  . Renal abscess 08/22/2018  . Tachycardia 08/21/2018  . Renal mass 08/21/2018  . Urinary tract infection without hematuria   . Sepsis ([place])   . Near syncope 05/03/2018  . Type 2 diabetes mellitus with hyperglycemia, with long-term current use of insulin ([place])   . Condyloma acuminatum of vulva 10/31/2017  . DKA, type 1 ([place]) 10/23/2017  . 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 Frequent No-show for appointment 11/03/2016  . Type 1 diabetes mellitus without  complication ([place]) 01/60/1093    Past Surgical History:  Procedure Laterality Date  . NO PAST SURGERIES       OB History   No obstetric history on file.      Home Medications    Prior to Admission medications   Medication Sig Start Date End Date Taking? Authorizing Provider  LANTUS SOLOSTAR 100 UNIT/ML Solostar Pen ADMINISTER 30 UNITS UNDER THE SKIN DAILY BEFORE BREAKFAST Patient taking differently: Inject 30 Units into the skin daily.  10/01/18  Yes Riccio, Angela C, DO  NOVOLOG FLEXPEN 100 UNIT/ML FlexPen ADMINISTER 10 UNITS UNDER THE SKIN THREE TIMES DAILY BEFORE MEALS Patient taking differently: Inject 10 Units into the skin 3 (three) times daily before meals.  07/29/18  Yes Riccio, Angela C, DO  ondansetron (ZOFRAN-ODT) 4 MG disintegrating tablet Take 1 tablet (4 mg total) by mouth every 6 (six) hours as needed for nausea or vomiting. 08/24/18  Yes Rai, Vernelle Emerald, MD  ACCU-CHEK AVIVA PLUS test strip USE AS DIRECTED 06/18/18   Steve Rattler, DO  ACCU-CHEK FASTCLIX LANCETS MISC Check sugar 10 x daily 06/15/17   Rogue Bussing, MD  acetaminophen (TYLENOL) 325 MG tablet Take 2 tablets (650 mg total) by mouth every 6 (six) hours as needed for mild pain or fever (over the counter). Patient not taking: Reported on 10/13/2018 08/24/18   Mendel Corning, MD  acetone, urine, test strip Check ketones per protocol Patient not taking: Reported on 05/03/2018 09/23/15   Levon Hedger, MD  Alcohol Swabs (ALCOHOL  PADS) 70 % PADS Use to wipe skin prior to insulin injections twice daily 09/23/15   Levon Hedger, MD  glucagon 1 MG injection Inject 1mg  IM if unconscious, seizing, or unable to eat to correct low blood sugar 08/24/18   Rai, Ripudeep K, MD  glucose blood (ACCU-CHEK AVIVA PLUS) test strip Please check blood sugars 4 times a day 05/03/18   Lucila Maine C, DO  Insulin Pen Needle (B-D UF III MINI PEN NEEDLES) 31G X 5 MM MISC CHECK BLOOD SUGAR IN THE MORNING BEFORE  EATING, BEFORE EACH MEAL, AND AS NEEDED 06/15/17   Rogue Bussing, MD  Insulin Pen Needle (B-D UF III MINI PEN NEEDLES) 31G X 5 MM MISC USE TO CHECK BLOOD SUGAR IN THE MORNING BEFORE EATING, BEFORE EACH MEAL, AND AS NEEDED 08/14/18   Lucila Maine C, DO  Lancets (ACCU-CHEK SOFT TOUCH) lancets Use as directed. 10/24/17   Kathrene Alu, MD    Family History Family History  Problem Relation Age of Onset  . Diabetes Maternal Grandmother   . Heart disease Maternal Grandmother        Deceased from MI at age 54  . Hypertension Maternal Grandmother   . Hypercholesterolemia Mother   . Seizures Mother   . Kidney Stones Mother   . Hyperlipidemia Mother   . Stroke Maternal Grandfather        Deceased from stroke at age 78  . Hypertension Paternal Grandmother   . Healthy Father     Social History Social History   Tobacco Use  . Smoking status: Passive Smoke Exposure - Never Smoker  . Smokeless tobacco: Never Used  Substance Use Topics  . Alcohol use: Yes    Comment: Last drank 1 month ago per MD note  . Drug use: Yes    Types: Marijuana    Comment: Last used 1  month ago per MD note     Allergies   Patient has no known allergies.   Review of Systems Review of Systems  Constitutional: Negative for activity change, appetite change, chills, fever and unexpected weight change.  HENT: Negative for congestion, rhinorrhea and sore throat.   Eyes: Negative for visual disturbance.  Respiratory: Negative for cough and shortness of breath.   Cardiovascular: Negative for chest pain.  Gastrointestinal: Positive for abdominal pain, nausea and vomiting. Negative for constipation and diarrhea.  Genitourinary: Negative for dysuria, flank pain and frequency.  Musculoskeletal: Negative for back pain.  Skin: Negative for rash.  Allergic/Immunologic: Negative for immunocompromised state.  Neurological: Negative for dizziness and weakness.  Psychiatric/Behavioral: The patient is not  nervous/anxious.     Physical Exam Updated Vital Signs BP 118/83   Pulse 77   Temp 98 F (36.7 C) (Oral)   Resp (!) 22   Ht 5\' 7"  (1.702 m)   Wt 59 kg   SpO2 100%   BMI 20.36 kg/m   Physical Exam Vitals signs and nursing note reviewed.  Constitutional:      General: She is not in acute distress.    Appearance: She is well-developed. She is not diaphoretic.     Comments: Patient had nausea and vomiting throughout the exam.  HENT:     Head: Normocephalic and atraumatic.     Right Ear: Tympanic membrane, ear canal and external ear normal.     Left Ear: Tympanic membrane, ear canal and external ear normal.     Nose: Nose normal. No congestion or rhinorrhea.     Mouth/Throat:  Mouth: Mucous membranes are dry.     Pharynx: No posterior oropharyngeal erythema.  Eyes:     General: No scleral icterus.    Extraocular Movements: Extraocular movements intact.     Conjunctiva/sclera: Conjunctivae normal.     Pupils: Pupils are equal, round, and reactive to light.  Neck:     Musculoskeletal: Normal range of motion and neck supple.  Cardiovascular:     Rate and Rhythm: Normal rate and regular rhythm.     Heart sounds: Normal heart sounds. No murmur. No friction rub. No gallop.   Pulmonary:     Effort: Pulmonary effort is normal. No respiratory distress.     Breath sounds: Normal breath sounds. No wheezing or rales.  Abdominal:     General: Bowel sounds are normal. There is no distension.     Palpations: Abdomen is soft. Abdomen is not rigid. There is no mass.     Tenderness: There is no abdominal tenderness. There is no guarding or rebound.     Hernia: No hernia is present.     Comments: No tenderness on abdominal exam.   Musculoskeletal: Normal range of motion.  Skin:    Findings: No rash.  Neurological:     Mental Status: She is alert and oriented to person, place, and time.     ED Treatments / Results  Labs (all labs ordered are listed, but only abnormal results are  displayed) Labs Reviewed  COMPREHENSIVE METABOLIC PANEL - Abnormal; Notable for the following components:      Result Value   Potassium 3.3 (*)    CO2 18 (*)    Glucose, Bld 163 (*)    Total Protein 8.5 (*)    AST 104 (*)    ALT 53 (*)    Anion gap 18 (*)    All other components within normal limits  CBC WITH DIFFERENTIAL/PLATELET - Abnormal; Notable for the following components:   RBC 5.51 (*)    Hemoglobin 15.9 (*)    All other components within normal limits  URINALYSIS, ROUTINE W REFLEX MICROSCOPIC - Abnormal; Notable for the following components:   Color, Urine AMBER (*)    APPearance CLOUDY (*)    Glucose, UA 150 (*)    Hgb urine dipstick LARGE (*)    Bilirubin Urine SMALL (*)    Ketones, ur 80 (*)    Protein, ur 100 (*)    Leukocytes,Ua MODERATE (*)    RBC / HPF >50 (*)    WBC, UA >50 (*)    All other components within normal limits  CBG MONITORING, ED - Abnormal; Notable for the following components:   Glucose-Capillary 143 (*)    All other components within normal limits  POCT I-STAT EG7 - Abnormal; Notable for the following components:   pCO2, Ven 35.5 (*)    pO2, Ven 20.0 (*)    Acid-base deficit 4.0 (*)    Potassium 3.3 (*)    HCT 48.0 (*)    Hemoglobin 16.3 (*)    All other components within normal limits  I-STAT TROPONIN, ED - Abnormal; Notable for the following components:   Troponin i, poc 0.13 (*)    All other components within normal limits  URINE CULTURE  LIPASE, BLOOD  TROPONIN I  I-STAT VENOUS BLOOD GAS, ED    EKG EKG Interpretation  Date/Time:  Sunday October 13 2018 16:13:23 EDT Ventricular Rate:  71 PR Interval:    QRS Duration: 69 QT Interval:  399 QTC Calculation: 434 R  Axis:   78 Text Interpretation:  Sinus rhythm Atrial premature complex Short PR interval Confirmed by Quintella Reichert [phone]) on 10/13/2018 5:27:34 PM   Radiology No results found.  Procedures Procedures (including critical care time)  Medications Ordered in ED  Medications  potassium chloride 10 mEq in 100 mL IVPB (has no administration in time range)  sodium chloride 0.9 % bolus 1,000 mL (1,000 mLs Intravenous Bolus from Bag 10/13/18 1651)  metoCLOPramide (REGLAN) injection 10 mg (10 mg Intravenous Given 10/13/18 1652)  ondansetron (ZOFRAN) injection 4 mg (4 mg Intravenous Given 10/13/18 2001)     Initial Impression / Assessment and Plan / ED Course  I have reviewed the triage vital signs and the nursing notes.  Pertinent labs & imaging results that were available during my care of the patient were reviewed by me and considered in my medical decision making (see chart for details).  Clinical Course as of Oct 13 2002  Sun Oct 13, 2018  1716 UA reveals moderate leukocytes, RBCs, WBCs, glucose, ketones, and Hgb. Will order urine culture.  [place] GuestMarland Kitchen): MODERATE [AH]  4665 WBCs are within normal limits.  WBC: 7.1 [AH]  9935 Patient reports symptoms have improved while in the ER.   [AH]  1754 Elevated anion gap, potassium at 3.3, CO2 low at 18, and hyperglycemia noted at 163.  Anion gap(!): 18 [AH]  1934 Troponin elevated at 0.13. Lab accidentally ran troponin instead of blood gas sample. Patient denies chest pain.  Troponin i, poc(!!): 0.13 [AH]  1937 Venous pH is 7.374. PCO2 is 35.5.   pCO2, Ven(!): 35.5 [AH]  1938 Hypokalemia noted at 3.3.  Potassium(!): 3.3 [AH]    Clinical Course User Index [AH] Arville Lime, PA-C      Patient presents with nausea and vomiting. Patient has a history of type 1 diabetes. Patient has ketones, elevated anion gap, and mild hyperglycemia. Provided IVF, antiemetics, and potassium. Symptoms have improved while in the ER, but patient continues to have intermittent vomiting. Patient will require admission for DKA. Troponin was elevated at 0.13. Ordered troponin I due to possibility of false positive value. Serial troponin and EKGs will need monitoring. Consulted hospitalist. Hospitalist has agreed to admit  patient. Findings and plan of care discussed with supervising physician Dr. Ralene Bathe.   Final Clinical Impressions(s) / ED Diagnoses   Final diagnoses:  Diabetic ketoacidosis without coma associated with type 1 diabetes mellitus [facility])    ED Discharge Orders    None       Julienne Kass 10/13/18 2004    Quintella Reichert, MD 10/13/18 2022

## 2018-10-13 NOTE — ED Notes (Signed)
Respiratory called about ABG being ordered.

## 2018-10-13 NOTE — ED Notes (Signed)
ED TO INPATIENT HANDOFF REPORT  ED Nurse Name and Phone #: Crystal/Saxton Chain [phone]  S Name/Age/Gender Sabrina Sutton 23 y.o. female Room/Bed: 039C/039C  Code Status   Code Status: Full Code  Home/SNF/Other Home Patient oriented to: self, place, time and situation Is this baseline? Yes   Triage Complete: Triage complete  Chief Complaint dehydrated  Triage Note Pt has been having fluctuating blood sugars  since yesterday as well as n/v. The last meal she was able to hold down was Friday.   Allergies No Known Allergies  Level of Care/Admitting Diagnosis ED Disposition    ED Disposition Condition Comment   Admit  Hospital Area: [facility] [[id number]]  Level of Care: Progressive [102]  I expect the patient will be discharged within 24 hours: No (not a candidate for 5C-Observation unit)  Diagnosis: DKA, type 1 [facility]) [[id number]]  Admitting Physician: Ivor Costa [[id number]]  Attending Physician: Ivor Costa [[id number]]  PT Class (Do Not Modify): Observation [104]  PT Acc Code (Do Not Modify): Observation [10022]       B Medical/Surgery History Past Medical History:  Diagnosis Date  . Diabetes mellitus without complication ([place]) 2/37/62   + GAD Ab  . DKA (diabetic ketoacidoses) ([place]) 09/20/2015  . Hypokalemia   . Pyelonephritis 04/17/2016   Past Surgical History:  Procedure Laterality Date  . NO PAST SURGERIES       A IV Location/Drains/Wounds Patient Lines/Drains/Airways Status   Active Line/Drains/Airways    Name:   Placement date:   Placement time:   Site:   Days:   Peripheral IV 10/13/18 Right Hand   10/13/18    1651    Hand   less than 1          Intake/Output Last 24 hours No intake or output data in the 24 hours ending 10/13/18 2039  Labs/Imaging Results for orders placed or performed during the hospital encounter of 10/13/18 (from the past 48 hour(s))  CBG monitoring, ED     Status: Abnormal   Collection Time: 10/13/18  3:48 PM  Result  Value Ref Range   Glucose-Capillary 143 (H) 70 - 99 mg/dL   Comment 1 Notify RN    Comment 2 Document in Chart   Urinalysis, Routine w reflex microscopic     Status: Abnormal   Collection Time: 10/13/18  4:22 PM  Result Value Ref Range   Color, Urine AMBER (A) YELLOW    Comment: BIOCHEMICALS MAY BE AFFECTED BY COLOR   APPearance CLOUDY (A) CLEAR   Specific Gravity, Urine 1.030 1.005 - 1.030   pH 5.0 5.0 - 8.0   Glucose, UA 150 (A) NEGATIVE mg/dL   Hgb urine dipstick LARGE (A) NEGATIVE   Bilirubin Urine SMALL (A) NEGATIVE   Ketones, ur 80 (A) NEGATIVE mg/dL   Protein, ur 100 (A) NEGATIVE mg/dL   Nitrite NEGATIVE NEGATIVE   Leukocytes,Ua MODERATE (A) NEGATIVE   RBC / HPF >50 (H) 0 - 5 RBC/hpf   WBC, UA >50 (H) 0 - 5 WBC/hpf   Bacteria, UA NONE SEEN NONE SEEN   Squamous Epithelial / LPF 6-10 0 - 5   WBC Clumps PRESENT    Mucus PRESENT     Comment: Performed at [facility], [address], [place],  [address]  Comprehensive metabolic panel     Status: Abnormal   Collection Time: 10/13/18  4:45 PM  Result Value Ref Range   Sodium 135 135 - 145 mmol/L   Potassium 3.3 (L)  3.5 - 5.1 mmol/L   Chloride 99 98 - 111 mmol/L   CO2 18 (L) 22 - 32 mmol/L   Glucose, Bld 163 (H) 70 - 99 mg/dL   BUN 16 6 - 20 mg/dL   Creatinine, Ser 0.85 0.44 - 1.00 mg/dL   Calcium 9.9 8.9 - 10.3 mg/dL   Total Protein 8.5 (H) 6.5 - 8.1 g/dL   Albumin 4.4 3.5 - 5.0 g/dL   AST 104 (H) 15 - 41 U/L   ALT 53 (H) 0 - 44 U/L   Alkaline Phosphatase 109 38 - 126 U/L   Total Bilirubin 1.0 0.3 - 1.2 mg/dL   GFR calc non Af Amer >60 >60 mL/min   GFR calc Af Amer >60 >60 mL/min   Anion gap 18 (H) 5 - 15    Comment: Performed at [facility] [address] [place], [place] [address]  CBC with Differential     Status: Abnormal   Collection Time: 10/13/18  4:45 PM  Result Value Ref Range   WBC 7.1 4.0 - 10.5 K/uL   RBC 5.51 (H) 3.87 - 5.11 MIL/uL   Hemoglobin 15.9 (H) 12.0 - 15.0 g/dL   HCT  45.2 36.0 - 46.0 %   MCV 82.0 80.0 - 100.0 fL   MCH 28.9 26.0 - 34.0 pg   MCHC 35.2 30.0 - 36.0 g/dL   RDW 12.5 11.5 - 15.5 %   Platelets 340 150 - 400 K/uL   nRBC 0.0 0.0 - 0.2 %   Neutrophils Relative % 65 %   Neutro Abs 4.6 1.7 - 7.7 K/uL   Lymphocytes Relative 28 %   Lymphs Abs 2.0 0.7 - 4.0 K/uL   Monocytes Relative 5 %   Monocytes Absolute 0.4 0.1 - 1.0 K/uL   Eosinophils Relative 0 %   Eosinophils Absolute 0.0 0.0 - 0.5 K/uL   Basophils Relative 1 %   Basophils Absolute 0.0 0.0 - 0.1 K/uL   Immature Granulocytes 1 %   Abs Immature Granulocytes 0.04 0.00 - 0.07 K/uL    Comment: Performed at [facility] [address] [place], [place] [address]  Lipase, blood     Status: None   Collection Time: 10/13/18  4:45 PM  Result Value Ref Range   Lipase 23 11 - 51 U/L    Comment: Performed at [facility] [address] [place], [place] [address]  I-stat troponin, ED     Status: Abnormal   Collection Time: 10/13/18  7:08 PM  Result Value Ref Range   Troponin i, poc 0.13 (HH) 0.00 - 0.08 ng/mL   Comment NOTIFIED PHYSICIAN    Comment 3            Comment: Due to the release kinetics of cTnI, a negative result within the first hours of the onset of symptoms does not rule out myocardial infarction with certainty. If myocardial infarction is still suspected, repeat the test at appropriate intervals.   POCT I-Stat EG7     Status: Abnormal   Collection Time: 10/13/18  7:09 PM  Result Value Ref Range   pH, Ven 7.374 7.250 - 7.430   pCO2, Ven 35.5 (L) 44.0 - 60.0 mmHg   pO2, Ven 20.0 (LL) 32.0 - 45.0 mmHg   Bicarbonate 20.7 20.0 - 28.0 mmol/L   TCO2 22 22 - 32 mmol/L   O2 Saturation 32.0 %   Acid-base deficit 4.0 (H) 0.0 - 2.0 mmol/L   Sodium 137  135 - 145 mmol/L   Potassium 3.3 (L) 3.5 - 5.1 mmol/L   Calcium, Ion 1.16 1.15 - 1.40 mmol/L   HCT 48.0 (H) 36.0 - 46.0 %   Hemoglobin 16.3 (H) 12.0 - 15.0 g/dL   Patient temperature HIDE    Sample type VENOUS     Comment NOTIFIED PHYSICIAN    No results found.  Pending Labs Unresulted Labs (From admission, onward)    Start     Ordered   10/14/18 0500  Lipid panel  Tomorrow morning,   R    Comments:  Please obtain as a fasting lipid panel - should not have eaten/ drank food for 8 hours prior to labs.    10/13/18 2013   10/14/18 0500  Hemoglobin A1c  Tomorrow morning,   R     10/13/18 2013   10/13/18 2024  Rapid urine drug screen (hospital performed)  Once,   R     10/13/18 2023   10/13/18 2022  hCG, quantitative, pregnancy  Once,   R     10/13/18 2021   10/13/18 5397  Basic metabolic panel  STAT Now then every 4 hours ,   STAT     10/13/18 2015   10/13/18 2013  Culture, blood (Routine X 2) w Reflex to ID Panel  BLOOD CULTURE X 2,   R    Comments:  Please obtain prior to antibiotic administration.    10/13/18 2012   10/13/18 2013  Troponin I - Now Then Q6H  Now then every 6 hours,   R     10/13/18 2013   10/13/18 2012  Magnesium  Add-on,   R     10/13/18 2012   10/13/18 1926  Troponin I - ONCE - STAT  ONCE - STAT,   STAT     10/13/18 1926   10/13/18 1706  Urine Culture  Add-on,   STAT     10/13/18 1705          Vitals/Pain Today's Vitals   10/13/18 1830 10/13/18 1900 10/13/18 1930 10/13/18 2000  BP: 120/79 118/83  129/85  Pulse: 71 79 77 82  Resp: 14 19 (!) 22 18  Temp:      TempSrc:      SpO2: 100% 100% 100% 100%  Weight:      Height:      PainSc:        Isolation Precautions No active isolations  Medications Medications  potassium chloride 10 mEq in 100 mL IVPB (10 mEq Intravenous New Bag/Given 10/13/18 2011)  cefTRIAXone (ROCEPHIN) 1 g in sodium chloride 0.9 % 100 mL IVPB (has no administration in time range)  0.9 %  sodium chloride infusion (has no administration in time range)  0.9 %  sodium chloride infusion (has no administration in time range)  dextrose 5 %-0.45 % sodium chloride infusion (has no administration in time range)  insulin regular, human (MYXREDLIN)  100 units/ 100 mL infusion (has no administration in time range)  enoxaparin (LOVENOX) injection 40 mg (has no administration in time range)  potassium chloride 10 mEq in 100 mL IVPB (has no administration in time range)  ondansetron (ZOFRAN) injection 4 mg (has no administration in time range)  acetaminophen (TYLENOL) tablet 650 mg (has no administration in time range)  sodium chloride 0.9 % bolus 1,000 mL (1,000 mLs Intravenous Bolus from Bag 10/13/18 1651)  metoCLOPramide (REGLAN) injection 10 mg (10 mg Intravenous Given 10/13/18 1652)  ondansetron (ZOFRAN) injection 4 mg (4 mg  Intravenous Given 10/13/18 2001)  sodium chloride 0.9 % bolus 1,000 mL (1,000 mLs Intravenous New Bag/Given 10/13/18 2020)    Mobility walks     Focused Assessments          R Recommendations: See Admitting Provider Note  Report given to:   Additional Notes: pt is noncompliant with her home meds, states "I don't like sticking myself". Pt provided with education regarding the importance of taking her home meds.

## 2018-10-13 NOTE — H&P (Addendum)
History and Physical    Sabrina Sutton KDT:[id number] DOB: 1995/11/19 DOA: 10/13/2018  Referring MD/NP/PA:   PCP: Steve Rattler, DO   Patient coming from:  The patient is coming from home.  At baseline, pt is independent for most of ADL.        Chief Complaint: Nausea, vomiting, generalized weakness  HPI: Sabrina Sutton is a 23 y.o. female with medical history significant of type 1 diabetes, DKA, pyelonephritis/possible renal abscess, who presents with nausea, vomiting and generalized weakness.  Patient was recently hospitalized from 1/20 9-2/1 due to DKA and pyelonephritis and possible renal abscess.  Patient was treated with Rocephin in hospital and discharged on ciprofloxacin.  Patient states that she completed course of antibiotics.  She also followed up with urologist, Dr. Alinda Money, and scheduled another appointment on 10/25/2018.  Patient states that she has been doing normally on 2 yesterday when he started having nausea vomiting.  He has been having nonbilious and nonbloody vomitus, more than 10 times today.  No abdominal pain, diarrhea.  Denies fever or chills.  No sick contact.  No long distant traveling.  Patient denies any chest pain, shortness of breath, cough.  Patient also denies symptoms of UTI.  No unilateral weakness. Of note, due to mistake when collecting blood sample, poc Trop was checked, which is 0.013.  Patient does not have any chest pain.  ED Course: pt was found to have possible DKA (bicarbonate 18, blood sugar 163, anion gap 18, urinalysis positive for ketone). UA (showed cloudy appearance, moderate amount of leukocyte, no bacteria, WBC>50), WBC 7.1, lipase 23, pending pregnancy test, potassium 3.3, renal function normal, temperature normal, no tachycardia, oxygen saturation 99% on room air.  Patient is placed on stepdown bed for observation.   Review of Systems:   General: no fevers, chills, no body weight gain, has poor appetite, has fatigue HEENT: no blurry  vision, hearing changes or sore throat Respiratory: no dyspnea, coughing, wheezing CV: no chest pain, no palpitations GI: Has nausea, vomiting, no abdominal pain, diarrhea, constipation GU: no dysuria, burning on urination, increased urinary frequency, hematuria  Ext: no leg edema Neuro: no unilateral weakness, numbness, or tingling, no vision change or hearing loss Skin: no rash, no skin tear. MSK: No muscle spasm, no deformity, no limitation of range of movement in spin Heme: No easy bruising.  Travel history: No recent long distant travel.  Allergy: No Known Allergies  Past Medical History:  Diagnosis Date  . Diabetes mellitus without complication ([place]) 9/98/33   + GAD Ab  . DKA (diabetic ketoacidoses) ([place]) 09/20/2015  . Hypokalemia   . Pyelonephritis 04/17/2016    Past Surgical History:  Procedure Laterality Date  . NO PAST SURGERIES      Social History:  reports that she is a non-smoker but has been exposed to tobacco smoke. She has never used smokeless tobacco. She reports current alcohol use. She reports current drug use. Drug: Marijuana.  Family History:  Family History  Problem Relation Age of Onset  . Diabetes Maternal Grandmother   . Heart disease Maternal Grandmother        Deceased from MI at age 27  . Hypertension Maternal Grandmother   . Hypercholesterolemia Mother   . Seizures Mother   . Kidney Stones Mother   . Hyperlipidemia Mother   . Stroke Maternal Grandfather        Deceased from stroke at age 16  . Hypertension Paternal Grandmother   . Healthy Father  Prior to Admission medications   Medication Sig Start Date End Date Taking? Authorizing Provider  LANTUS SOLOSTAR 100 UNIT/ML Solostar Pen ADMINISTER 30 UNITS UNDER THE SKIN DAILY BEFORE BREAKFAST Patient taking differently: Inject 30 Units into the skin daily.  10/01/18  Yes Riccio, Angela C, DO  NOVOLOG FLEXPEN 100 UNIT/ML FlexPen ADMINISTER 10 UNITS UNDER THE SKIN THREE TIMES DAILY BEFORE  MEALS Patient taking differently: Inject 10 Units into the skin 3 (three) times daily before meals.  07/29/18  Yes Riccio, Angela C, DO  ondansetron (ZOFRAN-ODT) 4 MG disintegrating tablet Take 1 tablet (4 mg total) by mouth every 6 (six) hours as needed for nausea or vomiting. 08/24/18  Yes Rai, Vernelle Emerald, MD  ACCU-CHEK AVIVA PLUS test strip USE AS DIRECTED 06/18/18   Steve Rattler, DO  ACCU-CHEK FASTCLIX LANCETS MISC Check sugar 10 x daily 06/15/17   Rogue Bussing, MD  acetaminophen (TYLENOL) 325 MG tablet Take 2 tablets (650 mg total) by mouth every 6 (six) hours as needed for mild pain or fever (over the counter). Patient not taking: Reported on 10/13/2018 08/24/18   Mendel Corning, MD  acetone, urine, test strip Check ketones per protocol Patient not taking: Reported on 05/03/2018 09/23/15   Levon Hedger, MD  Alcohol Swabs (ALCOHOL PADS) 70 % PADS Use to wipe skin prior to insulin injections twice daily 09/23/15   Levon Hedger, MD  glucagon 1 MG injection Inject 1mg  IM if unconscious, seizing, or unable to eat to correct low blood sugar 08/24/18   Rai, Ripudeep K, MD  glucose blood (ACCU-CHEK AVIVA PLUS) test strip Please check blood sugars 4 times a day 05/03/18   Lucila Maine C, DO  Insulin Pen Needle (B-D UF III MINI PEN NEEDLES) 31G X 5 MM MISC CHECK BLOOD SUGAR IN THE MORNING BEFORE EATING, BEFORE EACH MEAL, AND AS NEEDED 06/15/17   Rogue Bussing, MD  Insulin Pen Needle (B-D UF III MINI PEN NEEDLES) 31G X 5 MM MISC USE TO CHECK BLOOD SUGAR IN THE MORNING BEFORE EATING, BEFORE EACH MEAL, AND AS NEEDED 08/14/18   Lucila Maine C, DO  Lancets (ACCU-CHEK SOFT TOUCH) lancets Use as directed. 10/24/17   Kathrene Alu, MD    Physical Exam: Vitals:   10/13/18 1900 10/13/18 1930 10/13/18 2000 10/14/18 0008  BP: 118/83  129/85 (!) 105/58  Pulse: 79 77 82 80  Resp: 19 (!) 22 18 17   Temp:    98.7 F (37.1 C)  TempSrc:    Oral  SpO2: 100% 100% 100% 100%   Weight:      Height:       General: Not in acute distress.  Dry mucus membrane HEENT:       Eyes: PERRL, EOMI, no scleral icterus.       ENT: No discharge from the ears and nose, no pharynx injection, no tonsillar enlargement.        Neck: No JVD, no bruit, no mass felt. Heme: No neck lymph node enlargement. Cardiac: S1/S2, RRR, No murmurs, No gallops or rubs. Respiratory: No rales, wheezing, rhonchi or rubs. GI: Soft, nondistended, nontender, no rebound pain, no organomegaly, BS present. GU: No hematuria Ext: No pitting leg edema bilaterally. 2+DP/PT pulse bilaterally. Musculoskeletal: No joint deformities, No joint redness or warmth, no limitation of ROM in spin. Skin: No rashes.  Neuro: Alert, oriented X3, cranial nerves II-XII grossly intact, moves all extremities normally.  Psych: Patient is not psychotic, no suicidal or hemocidal ideation.  Labs on Admission: I have personally reviewed following labs and imaging studies  CBC: Recent Labs  Lab 10/13/18 1645 10/13/18 1909  WBC 7.1  --   NEUTROABS 4.6  --   HGB 15.9* 16.3*  HCT 45.2 48.0*  MCV 82.0  --   PLT 340  --    Basic Metabolic Panel: Recent Labs  Lab 10/13/18 1645 10/13/18 1909 10/13/18 2200  NA 135 137 133*  K 3.3* 3.3* 3.3*  CL 99  --  101  CO2 18*  --  12*  GLUCOSE 163*  --  177*  BUN 16  --  11  CREATININE 0.85  --  0.98  CALCIUM 9.9  --  8.7*  MG  --   --  1.6*   GFR: Estimated Creatinine Clearance: 85.3 mL/min (by C-G formula based on SCr of 0.98 mg/dL). Liver Function Tests: Recent Labs  Lab 10/13/18 1645  AST 104*  ALT 53*  ALKPHOS 109  BILITOT 1.0  PROT 8.5*  ALBUMIN 4.4   Recent Labs  Lab 10/13/18 1645  LIPASE 23   No results for input(s): AMMONIA in the last 168 hours. Coagulation Profile: No results for input(s): INR, PROTIME in the last 168 hours. Cardiac Enzymes: Recent Labs  Lab 10/13/18 2200  TROPONINI <0.03   BNP (last 3 results) No results for input(s): PROBNP  in the last 8760 hours. HbA1C: No results for input(s): HGBA1C in the last 72 hours. CBG: Recent Labs  Lab 10/13/18 2211 10/13/18 2309 10/14/18 0004 10/14/18 0106 10/14/18 0206  GLUCAP 168* 187* 166* 154* 153*   Lipid Profile: No results for input(s): CHOL, HDL, LDLCALC, TRIG, CHOLHDL, LDLDIRECT in the last 72 hours. Thyroid Function Tests: No results for input(s): TSH, T4TOTAL, FREET4, T3FREE, THYROIDAB in the last 72 hours. Anemia Panel: No results for input(s): VITAMINB12, FOLATE, FERRITIN, TIBC, IRON, RETICCTPCT in the last 72 hours. Urine analysis:    Component Value Date/Time   COLORURINE AMBER (A) 10/13/2018 1622   APPEARANCEUR CLOUDY (A) 10/13/2018 1622   LABSPEC 1.030 10/13/2018 1622   PHURINE 5.0 10/13/2018 1622   GLUCOSEU 150 (A) 10/13/2018 1622   HGBUR LARGE (A) 10/13/2018 1622   BILIRUBINUR SMALL (A) 10/13/2018 1622   KETONESUR 80 (A) 10/13/2018 1622   PROTEINUR 100 (A) 10/13/2018 1622   UROBILINOGEN 1.0 08/21/2014 1549   NITRITE NEGATIVE 10/13/2018 1622   LEUKOCYTESUR MODERATE (A) 10/13/2018 1622   Sepsis Labs: @LABRCNTIP (procalcitonin:4,lacticidven:4) )No results found for this or any previous visit (from the past 240 hour(s)).   Radiological Exams on Admission: No results found.   EKG: Independently reviewed. Sinus rhythm, QTC 434, PAC, nonspecific T wave change   Assessment/Plan Principal Problem:   DKA, type 1 (HCC) Active Problems:   Type 1 diabetes mellitus without complication (HCC)   Pyelonephritis   Elevated troponin   Hypokalemia   Nausea & vomiting   DKA, type 1 ([place]): bicarbonate 18, blood sugar 163, anion gap 18, urinalysis positive for ketone. This is likely due to not taking insulin consistently.    - Place in SDU for obs - 2L of NS bolus - start DKA protocol with BMP q4h - IVF: NS 125 cc/h; and switch to D5-1/2NS for CBG<250 - replete K as needed - Zofran prn nausea  - NPO  - consult to diabetic educator and case manager   Pyelonephritis: pt was recently hospitalized from 1/20 9-2/1 due to DKA and pyelonephritis and possible renal abscess.  Patient was treated with Rocephin in hospital and discharged  on ciprofloxacin.  Patient states that she completed course of antibiotics.  She also followed up with urologist, Dr. Alinda Money, and scheduled another appointment on 10/25/2018. Now pt has severe nausea vomiting.  Urinalysis positive, indicating possible incomplete clearance of previous pyelonephritis. -Start IV Rocephin - follow-up blood culture and urine culture -f/u with Dr. Alinda Money  # CT-abdomen/pelvis on 08/21/2018: 1. 3.4 cm heterogeneously enhancing mass seen in the right kidney concerning for probable renal cell carcinoma. Abscess is less likely. Further evaluation with MRI and urologic consult is recommended. These results were called by telephone at the time of interpretation on 08/21/2018 at 2:13 pm to Dr. Franchot Heidelberg , who verbally acknowledged these results. 2. Wedge-shaped low density seen in the left kidney which may represent cyst, but other pathology can not be excluded. Further evaluation with ultrasound is recommended. 3. 3.2 cm right ovarian cyst.  Nausea & vomiting: Lipase normal.  Differential diagnosis include pyelonephritis, viral gastritis, gastroparesis and partially from DKA.  No abdominal pain or diarrhea. -IV fluid and supportive care  Type 1 diabetes mellitus without complication ([place]): Last A1c 13.3 on 08/22/18, poorly controled. Patient is taking NovoLog and Lantus at home, but stating that she has not been taking insulin consistently.  Patient has a DKA now. -On DKA protocol now.  Hypokalemia: K= 3.3 on admission. - Repleted - Check Mg level -->1.6, will give 2 g of magnesium sulfate.  Elevated troponin: poc trop 0.13. Trop was checked by mistake.  Patient denies any chest pain or shortness of breath. - cycle CE q6 x3 and repeat EKG in the am  - Aspirin - Risk factor stratification:  will check FLP and A1C, UDS   DVT ppx: SQ Lovenox Code Status: Full code Family Communication: None at bed side.       Disposition Plan:  Anticipate discharge back to previous home environment Consults called:  None Admission status:   SDU/obs  Date of Service 10/14/2018   [facility]   If 7PM-7AM, please contact night-coverage [email or web address] Password [facility] 10/14/2018, 3:04 AM

## 2018-10-13 NOTE — ED Triage Notes (Signed)
Pt has been having fluctuating blood sugars  since yesterday as well as n/v. The last meal she was able to hold down was Friday.

## 2018-10-14 ENCOUNTER — Observation Stay (HOSPITAL_COMMUNITY): Payer: Medicaid Other

## 2018-10-14 DIAGNOSIS — N83201 Unspecified ovarian cyst, right side: Secondary | ICD-10-CM | POA: Diagnosis present

## 2018-10-14 DIAGNOSIS — Z9114 Patient's other noncompliance with medication regimen: Secondary | ICD-10-CM | POA: Diagnosis not present

## 2018-10-14 DIAGNOSIS — R7989 Other specified abnormal findings of blood chemistry: Secondary | ICD-10-CM | POA: Diagnosis present

## 2018-10-14 DIAGNOSIS — E876 Hypokalemia: Secondary | ICD-10-CM | POA: Diagnosis present

## 2018-10-14 DIAGNOSIS — R112 Nausea with vomiting, unspecified: Secondary | ICD-10-CM

## 2018-10-14 DIAGNOSIS — Z833 Family history of diabetes mellitus: Secondary | ICD-10-CM | POA: Diagnosis not present

## 2018-10-14 DIAGNOSIS — F1290 Cannabis use, unspecified, uncomplicated: Secondary | ICD-10-CM | POA: Diagnosis present

## 2018-10-14 DIAGNOSIS — Z794 Long term (current) use of insulin: Secondary | ICD-10-CM | POA: Diagnosis not present

## 2018-10-14 DIAGNOSIS — Z66 Do not resuscitate: Secondary | ICD-10-CM | POA: Diagnosis present

## 2018-10-14 DIAGNOSIS — E1010 Type 1 diabetes mellitus with ketoacidosis without coma: Secondary | ICD-10-CM | POA: Diagnosis present

## 2018-10-14 DIAGNOSIS — N12 Tubulo-interstitial nephritis, not specified as acute or chronic: Secondary | ICD-10-CM | POA: Diagnosis present

## 2018-10-14 DIAGNOSIS — Z7722 Contact with and (suspected) exposure to environmental tobacco smoke (acute) (chronic): Secondary | ICD-10-CM | POA: Diagnosis present

## 2018-10-14 LAB — GLUCOSE, CAPILLARY
GLUCOSE-CAPILLARY: 181 mg/dL — AB (ref 70–99)
Glucose-Capillary: 120 mg/dL — ABNORMAL HIGH (ref 70–99)
Glucose-Capillary: 123 mg/dL — ABNORMAL HIGH (ref 70–99)
Glucose-Capillary: 127 mg/dL — ABNORMAL HIGH (ref 70–99)
Glucose-Capillary: 134 mg/dL — ABNORMAL HIGH (ref 70–99)
Glucose-Capillary: 136 mg/dL — ABNORMAL HIGH (ref 70–99)
Glucose-Capillary: 136 mg/dL — ABNORMAL HIGH (ref 70–99)
Glucose-Capillary: 153 mg/dL — ABNORMAL HIGH (ref 70–99)
Glucose-Capillary: 154 mg/dL — ABNORMAL HIGH (ref 70–99)
Glucose-Capillary: 154 mg/dL — ABNORMAL HIGH (ref 70–99)
Glucose-Capillary: 155 mg/dL — ABNORMAL HIGH (ref 70–99)
Glucose-Capillary: 166 mg/dL — ABNORMAL HIGH (ref 70–99)
Glucose-Capillary: 170 mg/dL — ABNORMAL HIGH (ref 70–99)
Glucose-Capillary: 186 mg/dL — ABNORMAL HIGH (ref 70–99)
Glucose-Capillary: 187 mg/dL — ABNORMAL HIGH (ref 70–99)
Glucose-Capillary: 191 mg/dL — ABNORMAL HIGH (ref 70–99)
Glucose-Capillary: 204 mg/dL — ABNORMAL HIGH (ref 70–99)
Glucose-Capillary: 231 mg/dL — ABNORMAL HIGH (ref 70–99)
Glucose-Capillary: 260 mg/dL — ABNORMAL HIGH (ref 70–99)
Glucose-Capillary: 277 mg/dL — ABNORMAL HIGH (ref 70–99)
Glucose-Capillary: 96 mg/dL (ref 70–99)

## 2018-10-14 LAB — BASIC METABOLIC PANEL
ANION GAP: 11 (ref 5–15)
Anion gap: 14 (ref 5–15)
Anion gap: 13 (ref 5–15)
Anion gap: 14 (ref 5–15)
Anion gap: 9 (ref 5–15)
BUN: 6 mg/dL (ref 6–20)
BUN: 6 mg/dL (ref 6–20)
BUN: <5 mg/dL — ABNORMAL LOW (ref 6–20)
BUN: <5 mg/dL — ABNORMAL LOW (ref 6–20)
BUN: <5 mg/dL — ABNORMAL LOW (ref 6–20)
CHLORIDE: 103 mmol/L (ref 98–111)
CO2: 18 mmol/L — ABNORMAL LOW (ref 22–32)
CO2: 19 mmol/L — ABNORMAL LOW (ref 22–32)
CO2: 21 mmol/L — ABNORMAL LOW (ref 22–32)
CO2: 22 mmol/L (ref 22–32)
CO2: 19 mmol/L — ABNORMAL LOW (ref 22–32)
Calcium: 8.7 mg/dL — ABNORMAL LOW (ref 8.9–10.3)
Calcium: 8.6 mg/dL — ABNORMAL LOW (ref 8.9–10.3)
Calcium: 9 mg/dL (ref 8.9–10.3)
Calcium: 8.6 mg/dL — ABNORMAL LOW (ref 8.9–10.3)
Calcium: 8.2 mg/dL — ABNORMAL LOW (ref 8.9–10.3)
Chloride: 104 mmol/L (ref 98–111)
Chloride: 103 mmol/L (ref 98–111)
Chloride: 105 mmol/L (ref 98–111)
Chloride: 106 mmol/L (ref 98–111)
Creatinine, Ser: 0.71 mg/dL (ref 0.44–1.00)
Creatinine, Ser: 0.73 mg/dL (ref 0.44–1.00)
Creatinine, Ser: 0.83 mg/dL (ref 0.44–1.00)
Creatinine, Ser: 0.86 mg/dL (ref 0.44–1.00)
Creatinine, Ser: 0.85 mg/dL (ref 0.44–1.00)
GFR calc Af Amer: >60 mL/min (ref >60)
GFR calc Af Amer: >60 mL/min (ref >60)
GFR calc Af Amer: >60 mL/min (ref >60)
GFR calc Af Amer: >60 mL/min (ref >60)
GFR calc Af Amer: >60 mL/min (ref >60)
GFR calc non Af Amer: >60 mL/min (ref >60)
GFR calc non Af Amer: >60 mL/min (ref >60)
GFR calc non Af Amer: >60 mL/min (ref >60)
GFR calc non Af Amer: >60 mL/min (ref >60)
GFR calc non Af Amer: >60 mL/min (ref >60)
Glucose, Bld: 167 mg/dL — ABNORMAL HIGH (ref 70–99)
Glucose, Bld: 88 mg/dL (ref 70–99)
Glucose, Bld: 132 mg/dL — ABNORMAL HIGH (ref 70–99)
Glucose, Bld: 125 mg/dL — ABNORMAL HIGH (ref 70–99)
Glucose, Bld: 266 mg/dL — ABNORMAL HIGH (ref 70–99)
Potassium: 3.4 mmol/L — ABNORMAL LOW (ref 3.5–5.1)
Potassium: 2.9 mmol/L — ABNORMAL LOW (ref 3.5–5.1)
Potassium: 3.1 mmol/L — ABNORMAL LOW (ref 3.5–5.1)
Potassium: 3 mmol/L — ABNORMAL LOW (ref 3.5–5.1)
Potassium: 3.2 mmol/L — ABNORMAL LOW (ref 3.5–5.1)
SODIUM: 138 mmol/L (ref 135–145)
SODIUM: 138 mmol/L (ref 135–145)
Sodium: 135 mmol/L (ref 135–145)
Sodium: 136 mmol/L (ref 135–145)
Sodium: 134 mmol/L — ABNORMAL LOW (ref 135–145)

## 2018-10-14 LAB — LIPID PANEL
Cholesterol: 237 mg/dL — ABNORMAL HIGH (ref 0–200)
HDL: 70 mg/dL (ref >40)
LDL Cholesterol: 158 mg/dL — ABNORMAL HIGH (ref 0–99)
Total CHOL/HDL Ratio: 3.4 RATIO
Triglycerides: 45 mg/dL (ref <150)
VLDL: 9 mg/dL (ref 0–40)

## 2018-10-14 LAB — RAPID URINE DRUG SCREEN, HOSP PERFORMED
Amphetamines: NOT DETECTED
Barbiturates: NOT DETECTED
Benzodiazepines: NOT DETECTED
Cocaine: NOT DETECTED
Opiates: NOT DETECTED
Tetrahydrocannabinol: POSITIVE — AB

## 2018-10-14 LAB — HEMOGLOBIN A1C
HEMOGLOBIN A1C: 11.7 % — AB (ref 4.8–5.6)
Mean Plasma Glucose: 289.09 mg/dL

## 2018-10-14 LAB — TROPONIN I
Troponin I: <0.03 ng/mL (ref <0.03)
Troponin I: <0.03 ng/mL (ref <0.03)

## 2018-10-14 MED ORDER — INSULIN ASPART 100 UNIT/ML ~~LOC~~ SOLN
0.0000 [IU] | Freq: Three times a day (TID) | SUBCUTANEOUS | Status: DC
  Administered 2018-10-14: 1 [IU] via SUBCUTANEOUS

## 2018-10-14 MED ORDER — PANTOPRAZOLE SODIUM 40 MG IV SOLR
40.0000 mg | Freq: Two times a day (BID) | INTRAVENOUS | Status: DC
  Administered 2018-10-14 – 2018-10-15 (×3): 40 mg via INTRAVENOUS
  Filled 2018-10-14 (×3): qty 40

## 2018-10-14 MED ORDER — INSULIN GLARGINE 100 UNIT/ML ~~LOC~~ SOLN
20.0000 [IU] | Freq: Every day | SUBCUTANEOUS | Status: DC
  Administered 2018-10-15 (×2): 20 [IU] via SUBCUTANEOUS
  Filled 2018-10-14 (×2): qty 0.2

## 2018-10-14 MED ORDER — POTASSIUM CHLORIDE 10 MEQ/100ML IV SOLN
INTRAVENOUS | Status: AC
  Administered 2018-10-14: 10 meq via INTRAVENOUS
  Filled 2018-10-14: qty 100

## 2018-10-14 MED ORDER — POTASSIUM CHLORIDE CRYS ER 20 MEQ PO TBCR
40.0000 meq | EXTENDED_RELEASE_TABLET | Freq: Once | ORAL | Status: AC
  Administered 2018-10-14: 40 meq via ORAL
  Filled 2018-10-14 (×2): qty 2

## 2018-10-14 MED ORDER — METOCLOPRAMIDE HCL 5 MG/ML IJ SOLN
10.0000 mg | Freq: Three times a day (TID) | INTRAMUSCULAR | Status: DC
  Administered 2018-10-14 – 2018-10-15 (×4): 10 mg via INTRAVENOUS
  Filled 2018-10-14 (×4): qty 2

## 2018-10-14 MED ORDER — PROMETHAZINE HCL 25 MG/ML IJ SOLN: 12.5000 mg | INTRAMUSCULAR | Status: DC | PRN

## 2018-10-14 MED ORDER — POTASSIUM CHLORIDE 10 MEQ/100ML IV SOLN
10.0000 meq | INTRAVENOUS | Status: AC
  Administered 2018-10-14 (×2): 10 meq via INTRAVENOUS
  Filled 2018-10-14 (×3): qty 100

## 2018-10-14 MED ORDER — MAGNESIUM SULFATE 2 GM/50ML IV SOLN
2.0000 g | Freq: Once | INTRAVENOUS | Status: AC
  Administered 2018-10-14: 2 g via INTRAVENOUS
  Filled 2018-10-14: qty 50

## 2018-10-14 MED ORDER — ONDANSETRON HCL 4 MG/2ML IJ SOLN
4.0000 mg | Freq: Three times a day (TID) | INTRAMUSCULAR | Status: DC
  Administered 2018-10-14: 4 mg via INTRAVENOUS
  Filled 2018-10-14: qty 2

## 2018-10-14 MED ORDER — INSULIN ASPART 100 UNIT/ML ~~LOC~~ SOLN: 0.0000 [IU] | Freq: Three times a day (TID) | SUBCUTANEOUS | Status: DC

## 2018-10-14 MED ORDER — POTASSIUM CHLORIDE 10 MEQ/100ML IV SOLN
10.0000 meq | INTRAVENOUS | Status: AC
  Administered 2018-10-14 (×2): 10 meq via INTRAVENOUS
  Filled 2018-10-14: qty 100

## 2018-10-14 NOTE — Progress Notes (Signed)
Inpatient Diabetes Program Recommendations  AACE/ADA: New Consensus Statement on Inpatient Glycemic Control (2015)  Target Ranges:  Prepandial:   less than 140 mg/dL      Peak postprandial:   less than 180 mg/dL (1-2 hours)      Critically ill patients:  140 - 180 mg/dL   Lab Results  Component Value Date   GLUCAP 134 (H) 10/14/2018   HGBA1C 11.7 (H) 10/14/2018    Review of Glycemic Control  Diabetes history: Type 1 DM  Outpatient Diabetes medications: Lantus 30 units sq daily                                                        Novolog 10 units tid with meals  Current orders for Inpatient glycemic control: Insulin drip (currently at 0.0 units/hour)                                                                          Novolog sensitive scale (0-9 units) tid with meals     Diabetic coordinator consult noted for DKA admission. Labs looking much better this afternoon (Co2 = 21 and AG =14) than upon admission. Just spoke to bedside RN and while patient was trying to see if she could keep down clear liquids MD ordered the Novolog sensitive scale (0-9 units) in addition to the drip to cover the intake. She only received 1 unit of Novolog for that at 1654. RN was calling MD back now with information she was able to tolerate CL diet.  Reminded RN that patient will need to have Basal insulin ordered and given 2 hours prior to drip being stopped (when order given to stop drip).   In addition, patient will need to have Novolog meal coverage ordered as Type 1 diabetic patients do not make any insulin and need to have basal, correction, and meal coverage ordered.   -- Will follow during hospitalization.--  Jonna Clark RN, MSN Diabetes Coordinator Inpatient Glycemic Control Team Team Pager: [phone] (8am-5pm)

## 2018-10-14 NOTE — Progress Notes (Signed)
PROGRESS NOTE        PATIENT DETAILS Name: Sabrina Sutton Age: 23 y.o. Sex: female Date of Birth: [birth year] Admit Date: 10/13/2018 Admitting Physician Ivor Costa, MD [id number], Gardiner Rhyme, DO  Brief Narrative: Patient is a 23 y.o. female with history of type I DM-noncompliant to medications-presenting with intractable vomiting-found to have DKA.  See below for further details  Subjective: Continues to vomit-has had multiple as needed antiemetics overnight.  Assessment/Plan: DKA: Has almost resolved-given intractable vomiting-would keep on IV insulin infusion-until oral intake has somewhat stabilized.  Type I DM: Currently on IV insulin-unfortunately she is noncompliant with insulin.  Have counseled.  Intractable vomiting: Either has gastroparesis in the setting of diabetes-or cannabinoid hyperemesis syndrome.  Counseled regarding importance of stopping cannabinoid use.  Start scheduled Reglan, scheduled Zofran-and as needed Phenergan (has received numerous PRN antiemetics overnight).  We will also start PPI.  Keep n.p.o. and obtain a x-ray of the abdomen to make sure she does not have a bowel obstruction.  Recent history of renal abscess: Apparently has completed a course of antimicrobial therapy-and has followed up with [facility] in the outpatient setting.  Currently has no symptoms of UTI (denies dysuria/frequency) and await blood/urine cultures.  DVT Prophylaxis: Prophylactic Lovenox  Code Status: Full code   Family Communication: None at bedside  Disposition Plan: Remain inpatient  Antimicrobial agents: Anti-infectives (From admission, onward)   Start     Dose/Rate Route Frequency Ordered Stop   10/13/18 2015  cefTRIAXone (ROCEPHIN) 1 g in sodium chloride 0.9 % 100 mL IVPB     1 g 200 mL/hr over 30 Minutes Intravenous Every 24 hours 10/13/18 2012        Procedures: None  CONSULTS:  None  Time spent: 25- minutes-Greater than  50% of this time was spent in counseling, explanation of diagnosis, planning of further management, and coordination of care.  MEDICATIONS: Scheduled Meds: . aspirin EC  81 mg Oral Daily  . enoxaparin (LOVENOX) injection  40 mg Subcutaneous Q24H  . metoCLOPramide (REGLAN) injection  10 mg Intravenous TID AC  . ondansetron (ZOFRAN) IV  4 mg Intravenous Q8H  . pantoprazole (PROTONIX) IV  40 mg Intravenous Q12H   Continuous Infusions: . sodium chloride Stopped (10/13/18 2236)  . cefTRIAXone (ROCEPHIN)  IV 1 g (10/13/18 2245)  . dextrose 5 % and 0.45% NaCl 125 mL/hr at 10/13/18 2111  . insulin 2.9 Units/hr (10/14/18 1027)  . potassium chloride     PRN Meds:.acetaminophen, promethazine   PHYSICAL EXAM: Vital signs: Vitals:   10/14/18 0008 10/14/18 0415 10/14/18 0502 10/14/18 0906  BP: (!) 105/58 (!) 120/99  (!) 122/95  Pulse: 80 96  82  Resp: 17 18  11   Temp: 98.7 F (37.1 C) 98.9 F (37.2 C) 99.1 F (37.3 C) 98.6 F (37 C)  TempSrc: Oral Oral Oral Oral  SpO2: 100%   100%  Weight:      Height:       Filed Weights   10/13/18 1548  Weight: 59 kg   Body mass index is 20.36 kg/m.   General appearance :Awake, alert, not in any distress. Speech Clear. Not toxic Looking Eyes:, pupils equally reactive to light and accomodation,no scleral icterus.Pink conjunctiva HEENT: Atraumatic and Normocephalic Neck: supple, no JVD. No cervical lymphadenopathy. No thyromegaly Resp:Good air entry bilaterally, no added sounds  CVS: S1 S2  regular, no murmurs.  GI: Bowel sounds present, Non tender and not distended with no gaurding, rigidity or rebound.No organomegaly Extremities: B/L Lower Ext shows no edema, both legs are warm to touch Neurology:  speech clear,Non focal, sensation is grossly intact. Psychiatric: Normal judgment and insight. Alert and oriented x 3. Normal mood. Musculoskeletal:No digital cyanosis Skin:No Rash, warm and dry Wounds:N/A  I have personally reviewed following  labs and imaging studies  LABORATORY DATA: CBC: Recent Labs  Lab 10/13/18 1645 10/13/18 1909  WBC 7.1  --   NEUTROABS 4.6  --   HGB 15.9* 16.3*  HCT 45.2 48.0*  MCV 82.0  --   PLT 340  --     Basic Metabolic Panel: Recent Labs  Lab 10/13/18 1645 10/13/18 1909 10/13/18 2200 10/14/18 0220 10/14/18 0720  NA 135 137 133* 135 136  K 3.3* 3.3* 3.3* 3.4* 2.9*  CL 99  --  101 103 104  CO2 18*  --  12* 18* 19*  GLUCOSE 163*  --  177* 167* 88  BUN 16  --  11 6 6   CREATININE 0.85  --  0.98 0.71 0.73  CALCIUM 9.9  --  8.7* 8.7* 8.6*  MG  --   --  1.6*  --   --     GFR: Estimated Creatinine Clearance: 104.5 mL/min (by C-G formula based on SCr of 0.73 mg/dL).  Liver Function Tests: Recent Labs  Lab 10/13/18 1645  AST 104*  ALT 53*  ALKPHOS 109  BILITOT 1.0  PROT 8.5*  ALBUMIN 4.4   Recent Labs  Lab 10/13/18 1645  LIPASE 23   No results for input(s): AMMONIA in the last 168 hours.  Coagulation Profile: No results for input(s): INR, PROTIME in the last 168 hours.  Cardiac Enzymes: Recent Labs  Lab 10/13/18 2200 10/14/18 0220 10/14/18 0720  TROPONINI <0.03 <0.03 <0.03    BNP (last 3 results) No results for input(s): PROBNP in the last 8760 hours.  HbA1C: Recent Labs    10/14/18 0220  HGBA1C 11.7*    CBG: Recent Labs  Lab 10/14/18 0407 10/14/18 0504 10/14/18 0617 10/14/18 0846 10/14/18 1011  GLUCAP 181* 186* 120* 170* 204*    Lipid Profile: Recent Labs    10/14/18 0220  CHOL 237*  HDL 70  LDLCALC 158*  TRIG 45  CHOLHDL 3.4    Thyroid Function Tests: No results for input(s): TSH, T4TOTAL, FREET4, T3FREE, THYROIDAB in the last 72 hours.  Anemia Panel: No results for input(s): VITAMINB12, FOLATE, FERRITIN, TIBC, IRON, RETICCTPCT in the last 72 hours.  Urine analysis:    Component Value Date/Time   COLORURINE AMBER (A) 10/13/2018 1622   APPEARANCEUR CLOUDY (A) 10/13/2018 1622   LABSPEC 1.030 10/13/2018 1622   PHURINE 5.0  10/13/2018 1622   GLUCOSEU 150 (A) 10/13/2018 1622   HGBUR LARGE (A) 10/13/2018 1622   BILIRUBINUR SMALL (A) 10/13/2018 1622   KETONESUR 80 (A) 10/13/2018 1622   PROTEINUR 100 (A) 10/13/2018 1622   UROBILINOGEN 1.0 08/21/2014 1549   NITRITE NEGATIVE 10/13/2018 1622   LEUKOCYTESUR MODERATE (A) 10/13/2018 1622    Sepsis Labs: Lactic Acid, Venous    Component Value Date/Time   LATICACIDVEN 1.1 08/21/2018 1636    MICROBIOLOGY: Recent Results (from the past 240 hour(s))  Culture, blood (Routine X 2) w Reflex to ID Panel     Status: None (Preliminary result)   Collection Time: 10/13/18  8:36 PM  Result Value Ref Range Status   Specimen Description BLOOD  LEFT HAND  Final   Special Requests   Final    BOTTLES DRAWN AEROBIC AND ANAEROBIC Blood Culture results may not be optimal due to an inadequate volume of blood received in culture bottles   Culture   Final    NO GROWTH < 24 HOURS Performed at [facility] [address] [place], [place] [address]    Report Status PENDING  Incomplete  Culture, blood (Routine X 2) w Reflex to ID Panel     Status: None (Preliminary result)   Collection Time: 10/13/18 10:08 PM  Result Value Ref Range Status   Specimen Description BLOOD LEFT ARM  Final   Special Requests   Final    BOTTLES DRAWN AEROBIC ONLY Blood Culture adequate volume   Culture   Final    NO GROWTH < 12 HOURS Performed at [facility], [place] [address] [place], [place] [address]    Report Status PENDING  Incomplete    RADIOLOGY STUDIES/RESULTS: No results found.   LOS: 0 days   Oren Binet, MD  [facility]  If 7PM-7AM, please contact night-coverage  Please page via [email or web address]  Go to [email or web address] and use [facility]'s universal password to access. If you do not have the password, please contact the hospital operator.  Locate the [facility] provider you are looking for under [facility] and page to a number that you can be directly reached. If  you still have difficulty reaching the provider, please page the [facility] (Director on Call) for the Hospitalists listed on amion for assistance.  10/14/2018, 10:41 AM

## 2018-10-15 LAB — CBC
HCT: 35.5 % — ABNORMAL LOW (ref 36.0–46.0)
Hemoglobin: 12.2 g/dL (ref 12.0–15.0)
MCH: 28.9 pg (ref 26.0–34.0)
MCHC: 34.4 g/dL (ref 30.0–36.0)
MCV: 84.1 fL (ref 80.0–100.0)
Platelets: 236 10*3/uL (ref 150–400)
RBC: 4.22 MIL/uL (ref 3.87–5.11)
RDW: 13 % (ref 11.5–15.5)
WBC: 8 10*3/uL (ref 4.0–10.5)
nRBC: 0 % (ref 0.0–0.2)

## 2018-10-15 LAB — URINE CULTURE: Culture: 10000 — AB

## 2018-10-15 LAB — GLUCOSE, CAPILLARY
Glucose-Capillary: 115 mg/dL — ABNORMAL HIGH (ref 70–99)
Glucose-Capillary: 136 mg/dL — ABNORMAL HIGH (ref 70–99)
Glucose-Capillary: 136 mg/dL — ABNORMAL HIGH (ref 70–99)
Glucose-Capillary: 143 mg/dL — ABNORMAL HIGH (ref 70–99)
Glucose-Capillary: 64 mg/dL — ABNORMAL LOW (ref 70–99)
Glucose-Capillary: 86 mg/dL (ref 70–99)
Glucose-Capillary: 86 mg/dL (ref 70–99)

## 2018-10-15 LAB — BASIC METABOLIC PANEL
Anion gap: 12 (ref 5–15)
CO2: 20 mmol/L — ABNORMAL LOW (ref 22–32)
Calcium: 8.7 mg/dL — ABNORMAL LOW (ref 8.9–10.3)
Chloride: 106 mmol/L (ref 98–111)
Creatinine, Ser: 0.67 mg/dL (ref 0.44–1.00)
GFR calc Af Amer: >60 mL/min (ref >60)
GFR calc non Af Amer: >60 mL/min (ref >60)
Glucose, Bld: 120 mg/dL — ABNORMAL HIGH (ref 70–99)
Potassium: 3.3 mmol/L — ABNORMAL LOW (ref 3.5–5.1)
Sodium: 138 mmol/L (ref 135–145)

## 2018-10-15 LAB — MAGNESIUM: Magnesium: 1.9 mg/dL (ref 1.7–2.4)

## 2018-10-15 MED ORDER — DEXTROSE 50 % IV SOLN
INTRAVENOUS | Status: AC
  Administered 2018-10-15: 50 mL
  Filled 2018-10-15: qty 50

## 2018-10-15 MED ORDER — SODIUM CHLORIDE 0.9 % IV SOLN: INTRAVENOUS | Status: DC

## 2018-10-15 MED ORDER — SODIUM CHLORIDE 0.9 % IV SOLN
INTRAVENOUS | Status: DC
  Administered 2018-10-15: 05:00:00 via INTRAVENOUS

## 2018-10-15 NOTE — Progress Notes (Signed)
Marjarie Cordell to be D/C'd home per MD order. Discussed with the patient and all questions fully answered. VVS, Skin clean, dry and intact without evidence of skin break down, no evidence of skin tears noted.  IV catheter discontinued intact. Site without signs and symptoms of complications. Dressing and pressure applied.  An After Visit Summary was printed and given to the patient.  Patient ambulated off unit, and D/C home via private auto.  Melonie Florida  10/15/2018 10:30 AM

## 2018-10-15 NOTE — Progress Notes (Signed)
Spoke to Dr. Hilbert Bible and was given orders to now give patient lantus dose now. She will increase D5 & 1/2 NS  To 150 ml per hour. Dr. Hilbert Bible says repeat blood sugar for the next two hours and hold insulin drip for right now. Give her a call back after second blood sugar check.

## 2018-10-15 NOTE — Discharge Summary (Addendum)
PATIENT DETAILS Name: Sabrina Sutton Age: 23 y.o. Sex: female Date of Birth: January 24, 1996 MRN: [id number]. Admitting Physician: Ivor Costa, MD VQQ:[id number], Sabrina Rhyme, DO  Admit Date: 10/13/2018 Discharge date: 10/15/2018  Recommendations for Outpatient Follow-up:  1. Follow up with PCP in 1-2 weeks 2. Please obtain BMP/CBC in one week 3. Please follow blood/urine cultures until final  Admitted From:  Home  Disposition: [place]: No  Equipment/Devices: None  Discharge Condition: Stable  CODE STATUS: DNR  Diet recommendation:  Carb Modified   Brief Summary: See H&P, Labs, Consult and Test reports for all details in brief, Patient is a 23 y.o. female with history of type I DM-noncompliant to medications-presenting with intractable vomiting-found to have DKA.  See below for further details  Brief Hospital Course: DKA:  Resolved with IV insulin and IV fluids-titrated off to subcutaneous insulin last evening.  Type I DM: CBGs currently stable-was titrated off insulin drip.  Noncompliant-have counseled extensively.  Stable for discharge-continue usual regimen of insulin on discharge-she has been asked to keep a record of her CBGs-and take it to her next PCP appointment.  Once he demonstrates compliance-hopefully we can optimize regimen further.  Intractable vomiting: Either has gastroparesis in the setting of diabetes-or cannabinoid hyperemesis syndrome.    Treated with scheduled Reglan, scheduled Zofran and PPI.  Vomiting has completely resolved-tolerated advancement in diet-on a regular diet this morning.  Counseled extensively to stop using marijuana.    Recent history of renal abscess: Apparently has completed a course of antimicrobial therapy just earlier this month-both urine/blood cultures are negative so far.  She also has no symptoms of dysuria/frequency-and has been afebrile.  Continue outpatient follow-up with urology.  Procedures/Studies: None   Discharge Diagnoses:  Principal Problem:   DKA, type 1 ([place]) Active Problems:   Type 1 diabetes mellitus without complication (HCC)   Pyelonephritis   Elevated troponin   Hypokalemia   Nausea & vomiting   Discharge Instructions:  Activity:  As tolerated  Discharge Instructions    Call MD for:  persistant nausea and vomiting   Complete by:  As directed    Diet general   Complete by:  As directed    Discharge instructions   Complete by:  As directed    Follow with Primary MD  Sabrina Rattler, DO in 1 week  Stop using marijuana!!  Please get a complete blood count and chemistry panel checked by your Primary MD at your next visit, and again as instructed by your Primary MD.  Get Medicines reviewed and adjusted: Please take all your medications with you for your next visit with your Primary MD  Laboratory/radiological data: Please request your Primary MD to go over all hospital tests and procedure/radiological results at the follow up, please ask your Primary MD to get all Hospital records sent to his/her office.  In some cases, they will be blood work, cultures and biopsy results pending at the time of your discharge. Please request that your primary care M.D. follows up on these results.  Also Note the following: If you experience worsening of your admission symptoms, develop shortness of breath, life threatening emergency, suicidal or homicidal thoughts you must seek medical attention immediately by calling 911 or calling your MD immediately  if symptoms less severe.  You must read complete instructions/literature along with all the possible adverse reactions/side effects for all the Medicines you take and that have been prescribed to you. Take any new Medicines after you have completely  understood and accpet all the possible adverse reactions/side effects.   Do not drive when taking Pain medications or sleeping medications (Benzodaizepines)  Do not take more than  prescribed Pain, Sleep and Anxiety Medications. It is not advisable to combine anxiety,sleep and pain medications without talking with your primary care practitioner  Special Instructions: If you have smoked or chewed Tobacco  in the last 2 yrs please stop smoking, stop any regular Alcohol  and or any Recreational drug use.  Wear Seat belts while driving.  Please note: You were cared for by a hospitalist during your hospital stay. Once you are discharged, your primary care physician will handle any further medical issues. Please note that NO REFILLS for any discharge medications will be authorized once you are discharged, as it is imperative that you return to your primary care physician (or establish a relationship with a primary care physician if you do not have one) for your post hospital discharge needs so that they can reassess your need for medications and monitor your lab values.   Increase activity slowly   Complete by:  As directed      Allergies as of 10/15/2018   No Known Allergies     Medication List    TAKE these medications   Accu-Chek FastClix Lancets Misc Check sugar 10 x daily   accu-chek soft touch lancets Use as directed.   acetaminophen 325 MG tablet Commonly known as:  TYLENOL Take 2 tablets (650 mg total) by mouth every 6 (six) hours as needed for mild pain or fever (over the counter).   acetone (urine) test strip Check ketones per protocol   Alcohol Pads 70 % Pads Use to wipe skin prior to insulin injections twice daily   glucagon 1 MG injection Inject 1mg  IM if unconscious, seizing, or unable to eat to correct low blood sugar   glucose blood test strip Commonly known as:  Accu-Chek Aviva Plus Please check blood sugars 4 times a day   Accu-Chek Aviva Plus test strip Generic drug:  glucose blood USE AS DIRECTED   Insulin Pen Needle 31G X 5 MM Misc Commonly known as:  B-D UF III MINI PEN NEEDLES CHECK BLOOD SUGAR IN THE MORNING BEFORE EATING, BEFORE  EACH MEAL, AND AS NEEDED   Insulin Pen Needle 31G X 5 MM Misc Commonly known as:  B-D UF III MINI PEN NEEDLES USE TO CHECK BLOOD SUGAR IN THE MORNING BEFORE EATING, BEFORE EACH MEAL, AND AS NEEDED   Lantus SoloStar 100 UNIT/ML Solostar Pen Generic drug:  Insulin Glargine ADMINISTER 30 UNITS UNDER THE SKIN DAILY BEFORE BREAKFAST What changed:  See the new instructions.   NovoLOG FlexPen 100 UNIT/ML FlexPen Generic drug:  insulin aspart ADMINISTER 10 UNITS UNDER THE SKIN THREE TIMES DAILY BEFORE MEALS What changed:  See the new instructions.   ondansetron 4 MG disintegrating tablet Commonly known as:  ZOFRAN-ODT Take 1 tablet (4 mg total) by mouth every 6 (six) hours as needed for nausea or vomiting.      Follow-up Information    Sabrina Rattler, DO. Schedule an appointment as soon as possible for a visit in 1 week(s).   Specialty:  Family Medicine Contact information: [place] [address] [phone]          No Known Allergies  Consultations:   None   Other Procedures/Studies: Dg Abd 2 Views  Result Date: 10/14/2018 CLINICAL DATA:  Nausea with vomiting lower abdominal pain EXAM: ABDOMEN - 2 VIEW  COMPARISON:  09/23/2018 CT FINDINGS: The bowel gas pattern is normal. There is no evidence of free air. No radio-opaque calculi or other significant radiographic abnormality is seen. Benign left pelvic vascular calcification noted. IMPRESSION: Negative. Electronically Signed   By: Jerilynn Mages.  Shick M.D.   On: 10/14/2018 11:23      TODAY-DAY OF DISCHARGE:  Subjective:   Sabrina Sutton today has no headache,no chest abdominal pain,no new weakness tingling or numbness, feels much better wants to go home today.   Objective:   Blood pressure 113/69, pulse 81, temperature 98.7 F (37.1 C), temperature source Oral, resp. rate 18, height 5\' 7"  (1.702 m), weight 59 kg, last menstrual period 10/14/2018, SpO2 100 %.  Intake/Output Summary (Last 24 hours) at  10/15/2018 0755 Last data filed at 10/15/2018 0600 Gross per 24 hour  Intake 383.5 ml  Output -  Net 383.5 ml   Filed Weights   10/13/18 1548  Weight: 59 kg    Exam: Awake Alert, Oriented *3, No new F.N deficits, Normal affect [place].AT,PERRAL Supple Neck,No JVD, No cervical lymphadenopathy appriciated.  Symmetrical Chest wall movement, Good air movement bilaterally, CTAB RRR,No Gallops,Rubs or new Murmurs, No Parasternal Heave +ve B.Sounds, Abd Soft, Non tender, No organomegaly appriciated, No rebound -guarding or rigidity. No Cyanosis, Clubbing or edema, No new Rash or bruise   PERTINENT RADIOLOGIC STUDIES: Dg Abd 2 Views  Result Date: 10/14/2018 CLINICAL DATA:  Nausea with vomiting lower abdominal pain EXAM: ABDOMEN - 2 VIEW COMPARISON:  09/23/2018 CT FINDINGS: The bowel gas pattern is normal. There is no evidence of free air. No radio-opaque calculi or other significant radiographic abnormality is seen. Benign left pelvic vascular calcification noted. IMPRESSION: Negative. Electronically Signed   By: Jerilynn Mages.  Shick M.D.   On: 10/14/2018 11:23     PERTINENT LAB RESULTS: CBC: Recent Labs    10/13/18 1645 10/13/18 1909 10/15/18 0557  WBC 7.1  --  8.0  HGB 15.9* 16.3* 12.2  HCT 45.2 48.0* 35.5*  PLT 340  --  236   CMET CMP     Component Value Date/Time   NA 138 10/15/2018 0557   K 3.3 (L) 10/15/2018 0557   CL 106 10/15/2018 0557   CO2 20 (L) 10/15/2018 0557   GLUCOSE 120 (H) 10/15/2018 0557   BUN <5 (L) 10/15/2018 0557   CREATININE 0.67 10/15/2018 0557   CALCIUM 8.7 (L) 10/15/2018 0557   PROT 8.5 (H) 10/13/2018 1645   ALBUMIN 4.4 10/13/2018 1645   AST 104 (H) 10/13/2018 1645   ALT 53 (H) 10/13/2018 1645   ALKPHOS 109 10/13/2018 1645   BILITOT 1.0 10/13/2018 1645   GFRNONAA >60 10/15/2018 0557   GFRAA >60 10/15/2018 0557    GFR Estimated Creatinine Clearance: 104.5 mL/min (by C-G formula based on SCr of 0.67 mg/dL). Recent Labs    10/13/18 1645  LIPASE 23    Recent Labs    10/13/18 2200 10/14/18 0220 10/14/18 0720  TROPONINI <0.03 <0.03 <0.03   Invalid input(s): POCBNP No results for input(s): DDIMER in the last 72 hours. Recent Labs    10/14/18 0220  HGBA1C 11.7*   Recent Labs    10/14/18 0220  CHOL 237*  HDL 70  LDLCALC 158*  TRIG 45  CHOLHDL 3.4   No results for input(s): TSH, T4TOTAL, T3FREE, THYROIDAB in the last 72 hours.  Invalid input(s): FREET3 No results for input(s): VITAMINB12, FOLATE, FERRITIN, TIBC, IRON, RETICCTPCT in the last 72 hours. Coags: No results for input(s): INR in the  last 72 hours.  Invalid input(s): PT Microbiology: Recent Results (from the past 240 hour(s))  Urine Culture     Status: None (Preliminary result)   Collection Time: 10/13/18  5:06 PM  Result Value Ref Range Status   Specimen Description URINE, RANDOM  Final   Special Requests NONE  Final   Culture   Final    CULTURE REINCUBATED FOR BETTER GROWTH Performed at [facility], [address] [place], [place] [address]    Report Status PENDING  Incomplete  Culture, blood (Routine X 2) w Reflex to ID Panel     Status: None (Preliminary result)   Collection Time: 10/13/18  8:36 PM  Result Value Ref Range Status   Specimen Description BLOOD LEFT HAND  Final   Special Requests   Final    BOTTLES DRAWN AEROBIC AND ANAEROBIC Blood Culture results may not be optimal due to an inadequate volume of blood received in culture bottles   Culture   Final    NO GROWTH < 24 HOURS Performed at [facility], [place] [address] [place], [place] [address]    Report Status PENDING  Incomplete  Culture, blood (Routine X 2) w Reflex to ID Panel     Status: None (Preliminary result)   Collection Time: 10/13/18 10:08 PM  Result Value Ref Range Status   Specimen Description BLOOD LEFT ARM  Final   Special Requests   Final    BOTTLES DRAWN AEROBIC ONLY Blood Culture adequate volume   Culture   Final    NO GROWTH < 12 HOURS Performed at  [facility], [place] [address] [place], [place] [address]    Report Status PENDING  Incomplete    FURTHER DISCHARGE INSTRUCTIONS:  Get Medicines reviewed and adjusted: Please take all your medications with you for your next visit with your Primary MD  Laboratory/radiological data: Please request your Primary MD to go over all hospital tests and procedure/radiological results at the follow up, please ask your Primary MD to get all Hospital records sent to his/her office.  In some cases, they will be blood work, cultures and biopsy results pending at the time of your discharge. Please request that your primary care M.D. goes through all the records of your hospital data and follows up on these results.  Also Note the following: If you experience worsening of your admission symptoms, develop shortness of breath, life threatening emergency, suicidal or homicidal thoughts you must seek medical attention immediately by calling 911 or calling your MD immediately  if symptoms less severe.  You must read complete instructions/literature along with all the possible adverse reactions/side effects for all the Medicines you take and that have been prescribed to you. Take any new Medicines after you have completely understood and accpet all the possible adverse reactions/side effects.   Do not drive when taking Pain medications or sleeping medications (Benzodaizepines)  Do not take more than prescribed Pain, Sleep and Anxiety Medications. It is not advisable to combine anxiety,sleep and pain medications without talking with your primary care practitioner  Special Instructions: If you have smoked or chewed Tobacco  in the last 2 yrs please stop smoking, stop any regular Alcohol  and or any Recreational drug use.  Wear Seat belts while driving.  Please note: You were cared for by a hospitalist during your hospital stay. Once you are discharged, your primary care physician will handle any further  medical issues. Please note that NO REFILLS for any  discharge medications will be authorized once you are discharged, as it is imperative that you return to your primary care physician (or establish a relationship with a primary care physician if you do not have one) for your post hospital discharge needs so that they can reassess your need for medications and monitor your lab values.  Total Time spent coordinating discharge including counseling, education and face to face time equals 25  Minutes.  SignedOren Binet 10/15/2018 7:55 AM

## 2018-10-15 NOTE — Progress Notes (Signed)
Paged Dr. Hilbert Bible regarding last two blood sugar results and further treatment. 115 and 143.

## 2018-10-15 NOTE — Progress Notes (Signed)
Inpatient Diabetes Program Recommendations  AACE/ADA: New Consensus Statement on Inpatient Glycemic Control (2015)  Target Ranges:  Prepandial:   less than 140 mg/dL      Peak postprandial:   less than 180 mg/dL (1-2 hours)      Critically ill patients:  140 - 180 mg/dL   Lab Results  Component Value Date   GLUCAP 86 10/15/2018   HGBA1C 11.7 (H) 10/14/2018    Review of Glycemic Control  Inpatient Diabetes Program Recommendations:   Spoke with patient @ bedside and reviewed patient received Lantus 20 units @ 0229 am and again @ 0926 am today. Patient takes her Lantus daily in am @ home. Patient also agrees to check her CBGs more often after discharge to assess blood glucose levels. Discussed with patient need to take her insulin as prescribed and patient states she just doesn't like to stick herself. Patient uses insulin pen @ home. Patient acknowledges need to take her insulin regularly and reviewed risk factors of elevated glucose levels. Spoke with pt about A1C results 11.7 (down from 13.3 on 08/22/18)  and explained what an A1C is, basic pathophysiology of DM Type 1, basic home care, basic diabetes diet nutrition principles, importance of checking CBGs and maintaining good CBG control to prevent long-term and short-term complications. Reviewed signs and symptoms of hyperglycemia and hypoglycemia and how to treat hypoglycemia at home. Also reviewed blood sugar goals at home.  Reviewed basic nutrition information regarding limiting sugary drinks, carbohydrates, and patient states understanding.  Thank you, Nani Gasser. Avonne Berkery, RN, MSN, CDE  Diabetes Coordinator Inpatient Glycemic Control Team Team Pager [phone] (8am-5pm) 10/15/2018 10:15 AM

## 2018-10-15 NOTE — Progress Notes (Signed)
Spoke to Dr.Schorr received orders to discontinue insulin drip and D5 & 1/2 NS

## 2018-10-18 LAB — CULTURE, BLOOD (ROUTINE X 2)
CULTURE: NO GROWTH
CULTURE: NO GROWTH
Special Requests: ADEQUATE

## 2018-10-22 ENCOUNTER — Other Ambulatory Visit: Payer: Self-pay | Admitting: Family Medicine

## 2018-10-29 ENCOUNTER — Other Ambulatory Visit: Payer: Self-pay

## 2018-10-29 NOTE — Telephone Encounter (Signed)
Holly, case Freight forwarder with [facility], LVM on nurse line to have a continuous blood glucose monitor sent in for the patient. Holly left some information on to where and what kind of meter, however her phone was breaking up. I LVM her a VM to return my call so I could obtain that information.

## 2018-10-29 NOTE — Telephone Encounter (Signed)
Holly returned my call. Holly suggesting a [employer] for continuous blood glucose monitoring. Holly isn't even sure if the patient is a good candidate. This can be sent to her normal [facility] pharmacy. If she needs a telemedicine or office visit (once covid is under control) I can set that up as well. Please advise.

## 2018-10-31 NOTE — Telephone Encounter (Signed)
I am unsure if I can prescribe a CGM- Dr. Valentina Lucks, can we help this patient out? I have seen her once in clinic, she has poorly controlled T1DM w/ frequent admissions for DKA. She was seeing peds endo but aged out I assume.

## 2018-11-04 NOTE — Telephone Encounter (Signed)
Yes, attempt to send with "taking multi-shot inuslin regimen" on her prescription.   Does she have any experience with the reader or will she need [place] to monitor?  Sounds like an appropriate candidate. Let me know if she needs Rx clinic help.

## 2018-11-07 NOTE — Telephone Encounter (Addendum)
Contacted patient's mother, Smith Robert. We discussed that [facility] doesn't like sticking herself, so the [facility] case manager had recommended pursuing coverage for FreeStyle Libre CGM, to at least reduce needing finger sticks. Unfortunately, [place] Medicaid does not cover the FreeStyle Libre CGM. She denies any cost concerns in obtaining insulin therapy; frequent hospitalizations have been more related to patient adherence.   Letreile noted that in the past, insulin pump therapy had been discussed but they seemed to have "fallen through the cracks". She noted that Laelani has just been generally overwhelmed with her medical conditions, and would likely benefit from additional support.   [place]; they are not taking Medicaid patients for endocrine/pump therapy at this time.   Scheduled appointment in pharmacy clinic for patient next Friday. Her mother plans to be in attendance as well.  Catie Darnelle Maffucci, PharmD, [place] PGY2 Ambulatory Care Pharmacy Resident, [facility] Phone: [phone]

## 2018-11-12 ENCOUNTER — Encounter (HOSPITAL_COMMUNITY): Payer: Self-pay

## 2018-11-12 ENCOUNTER — Other Ambulatory Visit: Payer: Self-pay

## 2018-11-12 ENCOUNTER — Inpatient Hospital Stay (HOSPITAL_COMMUNITY)
Admission: EM | Admit: 2018-11-12 | Discharge: 2018-11-16 | DRG: 638 | Disposition: A | Discharge status: Home / Self Care | Payer: Medicaid Other | Attending: Internal Medicine | Admitting: Internal Medicine

## 2018-11-12 DIAGNOSIS — D751 Secondary polycythemia: Secondary | ICD-10-CM | POA: Diagnosis present

## 2018-11-12 DIAGNOSIS — E876 Hypokalemia: Secondary | ICD-10-CM | POA: Diagnosis present

## 2018-11-12 DIAGNOSIS — E875 Hyperkalemia: Secondary | ICD-10-CM | POA: Diagnosis present

## 2018-11-12 DIAGNOSIS — D72829 Elevated white blood cell count, unspecified: Secondary | ICD-10-CM | POA: Diagnosis present

## 2018-11-12 DIAGNOSIS — E109 Type 1 diabetes mellitus without complications: Secondary | ICD-10-CM

## 2018-11-12 DIAGNOSIS — Z9119 Patient's noncompliance with other medical treatment and regimen: Secondary | ICD-10-CM

## 2018-11-12 DIAGNOSIS — N179 Acute kidney failure, unspecified: Secondary | ICD-10-CM | POA: Diagnosis not present

## 2018-11-12 DIAGNOSIS — Z794 Long term (current) use of insulin: Secondary | ICD-10-CM

## 2018-11-12 DIAGNOSIS — E1110 Type 2 diabetes mellitus with ketoacidosis without coma: Secondary | ICD-10-CM | POA: Diagnosis present

## 2018-11-12 DIAGNOSIS — E1010 Type 1 diabetes mellitus with ketoacidosis without coma: Secondary | ICD-10-CM | POA: Diagnosis not present

## 2018-11-12 DIAGNOSIS — Z9114 Patient's other noncompliance with medication regimen: Secondary | ICD-10-CM | POA: Diagnosis not present

## 2018-11-12 DIAGNOSIS — E869 Volume depletion, unspecified: Secondary | ICD-10-CM | POA: Diagnosis present

## 2018-11-12 DIAGNOSIS — Z833 Family history of diabetes mellitus: Secondary | ICD-10-CM

## 2018-11-12 LAB — COMPREHENSIVE METABOLIC PANEL
ALT: 130 U/L — ABNORMAL HIGH (ref 0–44)
AST: 50 U/L — ABNORMAL HIGH (ref 15–41)
Albumin: 4.7 g/dL (ref 3.5–5.0)
Alkaline Phosphatase: 200 U/L — ABNORMAL HIGH (ref 38–126)
BUN: 34 mg/dL — ABNORMAL HIGH (ref 6–20)
CO2: <7 mmol/L — ABNORMAL LOW (ref 22–32)
Calcium: 9.3 mg/dL (ref 8.9–10.3)
Chloride: 91 mmol/L — ABNORMAL LOW (ref 98–111)
Creatinine, Ser: 1.99 mg/dL — ABNORMAL HIGH (ref 0.44–1.00)
GFR calc Af Amer: 41 mL/min — ABNORMAL LOW (ref >60)
GFR calc non Af Amer: 35 mL/min — ABNORMAL LOW (ref >60)
Glucose, Bld: 853 mg/dL (ref 70–99)
Potassium: 6.2 mmol/L — ABNORMAL HIGH (ref 3.5–5.1)
Sodium: 132 mmol/L — ABNORMAL LOW (ref 135–145)
Total Bilirubin: 2.3 mg/dL — ABNORMAL HIGH (ref 0.3–1.2)
Total Protein: 9.5 g/dL — ABNORMAL HIGH (ref 6.5–8.1)

## 2018-11-12 LAB — CBG MONITORING, ED
Glucose-Capillary: >600 mg/dL (ref 70–99)
Glucose-Capillary: >600 mg/dL (ref 70–99)
Glucose-Capillary: 480 mg/dL — ABNORMAL HIGH (ref 70–99)
Glucose-Capillary: >600 mg/dL (ref 70–99)
Glucose-Capillary: 352 mg/dL — ABNORMAL HIGH (ref 70–99)

## 2018-11-12 LAB — URINALYSIS, ROUTINE W REFLEX MICROSCOPIC
Bilirubin Urine: NEGATIVE
Glucose, UA: >=500 mg/dL — AB
Ketones, ur: 80 mg/dL — AB
Leukocytes,Ua: NEGATIVE
Nitrite: NEGATIVE
Protein, ur: 30 mg/dL — AB
RBC / HPF: >50 RBC/hpf — ABNORMAL HIGH (ref 0–5)
Specific Gravity, Urine: 1.021 (ref 1.005–1.030)
pH: 5 (ref 5.0–8.0)

## 2018-11-12 LAB — CBC
HCT: 48.6 % — ABNORMAL HIGH (ref 36.0–46.0)
Hemoglobin: 15.4 g/dL — ABNORMAL HIGH (ref 12.0–15.0)
MCH: 28.4 pg (ref 26.0–34.0)
MCHC: 31.7 g/dL (ref 30.0–36.0)
MCV: 89.5 fL (ref 80.0–100.0)
Platelets: 462 10*3/uL — ABNORMAL HIGH (ref 150–400)
RBC: 5.43 MIL/uL — ABNORMAL HIGH (ref 3.87–5.11)
RDW: 14.6 % (ref 11.5–15.5)
WBC: 24 10*3/uL — ABNORMAL HIGH (ref 4.0–10.5)
nRBC: 0 % (ref 0.0–0.2)

## 2018-11-12 LAB — I-STAT BETA HCG BLOOD, ED (MC, WL, AP ONLY): I-stat hCG, quantitative: <5 m[IU]/mL (ref <5)

## 2018-11-12 LAB — GLUCOSE, CAPILLARY
Glucose-Capillary: 245 mg/dL — ABNORMAL HIGH (ref 70–99)
Glucose-Capillary: 168 mg/dL — ABNORMAL HIGH (ref 70–99)
Glucose-Capillary: 148 mg/dL — ABNORMAL HIGH (ref 70–99)

## 2018-11-12 LAB — MRSA PCR SCREENING: MRSA by PCR: NEGATIVE

## 2018-11-12 MED ORDER — POTASSIUM CHLORIDE 10 MEQ/100ML IV SOLN: 10.0000 meq | INTRAVENOUS | Status: DC

## 2018-11-12 MED ORDER — MORPHINE SULFATE (PF) 4 MG/ML IV SOLN
4.0000 mg | Freq: Once | INTRAVENOUS | Status: AC
  Administered 2018-11-12: 4 mg via INTRAVENOUS
  Filled 2018-11-12: qty 1

## 2018-11-12 MED ORDER — SODIUM CHLORIDE 0.9 % IV SOLN: INTRAVENOUS | Status: DC

## 2018-11-12 MED ORDER — ONDANSETRON HCL 4 MG/2ML IJ SOLN
4.0000 mg | Freq: Once | INTRAMUSCULAR | Status: AC
  Administered 2018-11-12: 4 mg via INTRAVENOUS
  Filled 2018-11-12: qty 2

## 2018-11-12 MED ORDER — INSULIN REGULAR(HUMAN) IN NACL 100-0.9 UT/100ML-% IV SOLN
INTRAVENOUS | Status: DC
  Filled 2018-11-12: qty 100

## 2018-11-12 MED ORDER — INSULIN REGULAR(HUMAN) IN NACL 100-0.9 UT/100ML-% IV SOLN
INTRAVENOUS | Status: DC
  Administered 2018-11-12: 4.2 [IU]/h via INTRAVENOUS
  Filled 2018-11-12: qty 100

## 2018-11-12 MED ORDER — SODIUM CHLORIDE 0.9 % IV BOLUS
1000.0000 mL | Freq: Once | INTRAVENOUS | Status: AC
  Administered 2018-11-12: 1000 mL via INTRAVENOUS

## 2018-11-12 MED ORDER — DEXTROSE-NACL 5-0.45 % IV SOLN
INTRAVENOUS | Status: DC
  Administered 2018-11-13: 16:00:00 via INTRAVENOUS

## 2018-11-12 MED ORDER — SODIUM CHLORIDE 0.9 % IV SOLN
1.0000 g | Freq: Once | INTRAVENOUS | Status: AC
  Administered 2018-11-12: 1 g via INTRAVENOUS
  Filled 2018-11-12: qty 10

## 2018-11-12 MED ORDER — HEPARIN SODIUM (PORCINE) 5000 UNIT/ML IJ SOLN
5000.0000 [IU] | Freq: Three times a day (TID) | INTRAMUSCULAR | Status: DC
  Administered 2018-11-13 – 2018-11-14 (×3): 5000 [IU] via SUBCUTANEOUS
  Filled 2018-11-12 (×4): qty 1

## 2018-11-12 MED ORDER — DEXTROSE-NACL 5-0.45 % IV SOLN
INTRAVENOUS | Status: DC
  Administered 2018-11-12 – 2018-11-13 (×2): via INTRAVENOUS

## 2018-11-12 NOTE — Progress Notes (Signed)
Pt complained of nausea and then vomited. No PRN zofran.  ELink notified. Will continue to monitor patient.  Mariann Laster RN

## 2018-11-12 NOTE — ED Notes (Signed)
Date and time results received: 11/12/18 5:29 PM  (use smartphrase ".now" to insert current time)  Test: Glucose Critical Value: 853  Name of Provider Notified: Venora Maples  Orders Received? Or Actions Taken?: awaiting orders

## 2018-11-12 NOTE — ED Notes (Signed)
Urine culture also sent

## 2018-11-12 NOTE — ED Triage Notes (Signed)
Patient coming from home with c/o hyperglycemia/abdominal pain. Patient mom state she is non complice with her insulin. She last took her last night. Patient alert and oriented x 4. Patient given Zofran 4 mg IM per ems.

## 2018-11-12 NOTE — ED Notes (Signed)
Main lab aware to draw light green on pain.

## 2018-11-12 NOTE — H&P (Signed)
History and Physical  Sabrina Sutton SHF:[id number] DOB: 08-16-1995 DOA: 11/12/2018  Referring physician: ER provider PCP: Steve Rattler, DO  Outpatient Specialists:    Patient coming from: Home  Chief Complaint: Nausea, vomiting and abdominal pain since yesterday morning  HPI: Patient is a 23 year old African-American female, diagnosed with diabetes mellitus since the age of 85, on subcutaneous Lantus 30 units once daily and subcu NovoLog 10 units before meals.  Patient is noncompliant with insulin therapy, as patient reports not liking today with new dose.  Patient has been admitted repeatedly for DKA.  Urgent medical history includes pyelonephritis and hypokalemia.  Patient presents with nausea, vomiting and abdominal pain since yesterday morning.  According to the patient, she has not stopped vomiting since yesterday, and has not been able to keep anything down.  On presentation to the hospital, the blood sugar was 853, with CO2 of less than 7.  Sodium of 132, potassium of 6.2, serum creatinine of 1.99 (baseline of 0.67), total protein of 9.5 with albumin of 4.7, ALT of 130 and AST of 50 and alkaline phosphatase of 200.  WBC is 24.  No headache, no neck pain, no fever chills, no URI symptoms, no shortness of breath, no chest pain, no urinary symptoms.  Patient will be admitted for further assessment and management.  ED Course: Patient is currently being hydrated.  Insulin drip has been started.  Pertinent labs: As documented above.  Review of Systems:   Negative for fever, visual changes, sore throat, rash, new muscle aches, chest pain, SOB, dysuria, bleeding.  Past Medical History:  Diagnosis Date  . Diabetes mellitus without complication ([place]) 11/23/75   + GAD Ab  . DKA (diabetic ketoacidoses) ([place]) 09/20/2015  . Hypokalemia   . Pyelonephritis 04/17/2016    Past Surgical History:  Procedure Laterality Date  . NO PAST SURGERIES       reports that she is a non-smoker but has  been exposed to tobacco smoke. She has never used smokeless tobacco. She reports current alcohol use. She reports current drug use. Drug: Marijuana.  No Known Allergies  Family History  Problem Relation Age of Onset  . Diabetes Maternal Grandmother   . Heart disease Maternal Grandmother        Deceased from MI at age 64  . Hypertension Maternal Grandmother   . Hypercholesterolemia Mother   . Seizures Mother   . Kidney Stones Mother   . Hyperlipidemia Mother   . Stroke Maternal Grandfather        Deceased from stroke at age 62  . Hypertension Paternal Grandmother   . Healthy Father      Prior to Admission medications   Medication Sig Start Date End Date Taking? Authorizing Provider  glucagon 1 MG injection Inject 1mg  IM if unconscious, seizing, or unable to eat to correct low blood sugar 08/24/18  Yes Rai, Ripudeep K, MD  insulin aspart (NOVOLOG FLEXPEN) 100 UNIT/ML FlexPen Inject 10 Units into the skin 3 (three) times daily before meals. 10/23/18  Yes Riccio, Angela C, DO  LANTUS SOLOSTAR 100 UNIT/ML Solostar Pen ADMINISTER 30 UNITS UNDER THE SKIN DAILY BEFORE BREAKFAST Patient taking differently: Inject 30 Units into the skin daily.  10/01/18  Yes Riccio, Levada Dy C, DO  ACCU-CHEK AVIVA PLUS test strip USE AS DIRECTED 06/18/18   Steve Rattler, DO  ACCU-CHEK FASTCLIX LANCETS MISC Check sugar 10 x daily 06/15/17   Rogue Bussing, MD  acetaminophen (TYLENOL) 325 MG tablet Take 2 tablets (650 mg  total) by mouth every 6 (six) hours as needed for mild pain or fever (over the counter). Patient not taking: Reported on 10/13/2018 08/24/18   Mendel Corning, MD  acetone, urine, test strip Check ketones per protocol Patient not taking: Reported on 05/03/2018 09/23/15   Levon Hedger, MD  Alcohol Swabs (ALCOHOL PADS) 70 % PADS Use to wipe skin prior to insulin injections twice daily 09/23/15   Levon Hedger, MD  glucose blood (ACCU-CHEK AVIVA PLUS) test strip Please check  blood sugars 4 times a day 05/03/18   Lucila Maine C, DO  Insulin Pen Needle (B-D UF III MINI PEN NEEDLES) 31G X 5 MM MISC CHECK BLOOD SUGAR IN THE MORNING BEFORE EATING, BEFORE EACH MEAL, AND AS NEEDED 06/15/17   Rogue Bussing, MD  Insulin Pen Needle (B-D UF III MINI PEN NEEDLES) 31G X 5 MM MISC USE TO CHECK BLOOD SUGAR IN THE MORNING BEFORE EATING, BEFORE EACH MEAL, AND AS NEEDED 08/14/18   Lucila Maine C, DO  Lancets (ACCU-CHEK SOFT TOUCH) lancets Use as directed. 10/24/17   Kathrene Alu, MD  ondansetron (ZOFRAN-ODT) 4 MG disintegrating tablet Take 1 tablet (4 mg total) by mouth every 6 (six) hours as needed for nausea or vomiting. Patient not taking: Reported on 11/12/2018 08/24/18   Mendel Corning, MD    Physical Exam: Vitals:   11/12/18 1200 11/12/18 1623 11/12/18 1639 11/12/18 1800  BP: 135/90 (!) 138/101  137/84  Pulse:  (!) 142  (!) 133  Resp:  20  (!) 22  Temp:      TempSrc:      SpO2:  100%  100%  Weight:   58.5 kg   Height:   5\' 7"  (1.702 m)     Constitutional:  . Appears calm and comfortable.  Thin lady. Eyes:  . No pallor. No jaundice.  ENMT:  . external ears, nose appear normal.  Dry buccal mucosa. Neck:  . Neck is supple. No JVD Respiratory:  . CTA bilaterally, no w/r/r.  . Respiratory effort normal. No retractions or accessory muscle use Cardiovascular:  . S1S2 . No LE extremity edema   Abdomen:  . Abdomen is soft and non tender. Organs are difficult to assess. Neurologic:  . Awake and alert. . Moves all limbs.  Wt Readings from Last 3 Encounters:  11/12/18 58.5 kg  10/13/18 59 kg  08/22/18 60.9 kg    I have personally reviewed following labs and imaging studies  Labs on Admission:  CBC: Recent Labs  Lab 11/12/18 1218  WBC 24.0*  HGB 15.4*  HCT 48.6*  MCV 89.5  PLT 332*   Basic Metabolic Panel: Recent Labs  Lab 11/12/18 1619  NA 132*  K 6.2*  CL 91*  CO2 <7*  GLUCOSE 853*  BUN 34*  CREATININE 1.99*  CALCIUM 9.3    Liver Function Tests: Recent Labs  Lab 11/12/18 1619  AST 50*  ALT 130*  ALKPHOS 200*  BILITOT 2.3*  PROT 9.5*  ALBUMIN 4.7   No results for input(s): LIPASE, AMYLASE in the last 168 hours. No results for input(s): AMMONIA in the last 168 hours. Coagulation Profile: No results for input(s): INR, PROTIME in the last 168 hours. Cardiac Enzymes: No results for input(s): CKTOTAL, CKMB, CKMBINDEX, TROPONINI in the last 168 hours. BNP (last 3 results) No results for input(s): PROBNP in the last 8760 hours. HbA1C: No results for input(s): HGBA1C in the last 72 hours. CBG: Recent Labs  Lab 11/12/18  1139 11/12/18 1409 11/12/18 1635 11/12/18 1746  GLUCAP >600* >600* 480* >600*   Lipid Profile: No results for input(s): CHOL, HDL, LDLCALC, TRIG, CHOLHDL, LDLDIRECT in the last 72 hours. Thyroid Function Tests: No results for input(s): TSH, T4TOTAL, FREET4, T3FREE, THYROIDAB in the last 72 hours. Anemia Panel: No results for input(s): VITAMINB12, FOLATE, FERRITIN, TIBC, IRON, RETICCTPCT in the last 72 hours. Urine analysis:    Component Value Date/Time   COLORURINE YELLOW 11/12/2018 1247   APPEARANCEUR HAZY (A) 11/12/2018 1247   LABSPEC 1.021 11/12/2018 1247   PHURINE 5.0 11/12/2018 1247   GLUCOSEU >=500 (A) 11/12/2018 1247   HGBUR LARGE (A) 11/12/2018 1247   BILIRUBINUR NEGATIVE 11/12/2018 1247   KETONESUR 80 (A) 11/12/2018 1247   PROTEINUR 30 (A) 11/12/2018 1247   UROBILINOGEN 1.0 08/21/2014 1549   NITRITE NEGATIVE 11/12/2018 1247   LEUKOCYTESUR NEGATIVE 11/12/2018 1247   Sepsis Labs: @LABRCNTIP (procalcitonin:4,lacticidven:4) )No results found for this or any previous visit (from the past 240 hour(s)).    Radiological Exams on Admission: No results found.   Active Problems:   * No active hospital problems. *   Assessment/Plan DKA, severe: Admit patient to ICU Aggressive hydration Insulin drip Monitor and correct electrolytes Counseled patient regarding need  to be compliant.  Noncompliance: See above Consult diabetic resource nurse Likely DC with home health nursing for disease management and compliance  Volume depletion: Likely secondary to DKA, nausea and vomiting. Continue with aggressive hydration.  AKI, likely prerenal: Hydrate patient aggressively. Further management if no improvement is noted.  Abnormal liver function test: Repeat LFTs in the morning If no improvement in LFTs, consider further management  Leukocytosis: Likely reactive  Nausea/vomiting/abdominal pain: Secondary to DKA.  DVT prophylaxis: Subcutaneous heparin Code Status: Full code Family Communication:  Disposition Plan: Home eventually Consults called: Patient will need to be seen by diabetic resource nurse Admission status: Inpatient  Time spent: 65 minutes  Dana Allan, MD  [facility] Pager #: [phone] 7PM-7AM contact night coverage as above  11/12/2018, 6:58 PM

## 2018-11-12 NOTE — ED Notes (Signed)
Attempted to give report x 1. This Probation officer was told to call back to give report.

## 2018-11-12 NOTE — ED Notes (Signed)
Bed: EH20 Expected date:  Expected time:  Means of arrival:  Comments: EMS hyperglycemia/DKA?

## 2018-11-12 NOTE — ED Provider Notes (Signed)
[name]   CSN: [id number] Arrival date & time: 11/12/18  1127    History   Chief Complaint Chief Complaint  Patient presents with  . Hyperglycemia    HPI Sabrina Sutton is a 23 y.o. female.     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  She reports the majority of this is gone on since last night.  She admits to noncompliance with her insulin.  She states she does not like to give herself shots.  She has had multiple admissions for diabetic ketoacidosis.  Patient still with nausea at this time.  Symptoms are moderate to severe in severity.  No fevers or chills.  Denies dysuria but does report some urinary frequency.  Denies flank pain.  No chest pain or shortness of breath.  No recent cough or congestion.  Denies recent travel.   Past Medical History:  Diagnosis Date  . Diabetes mellitus without complication ([place]) 11/10/35   + GAD Ab  . DKA (diabetic ketoacidoses) ([place]) 09/20/2015  . Hypokalemia   . Pyelonephritis 04/17/2016    Patient Active Problem List   Diagnosis Date Noted  . Acute pyelonephritis 10/13/2018  . Elevated troponin 10/13/2018  . Hypokalemia 10/13/2018  . Nausea & vomiting 10/13/2018  . Renal abscess 08/22/2018  . Tachycardia 08/21/2018  . Renal mass 08/21/2018  . Urinary tract infection without hematuria   . Sepsis ([place])   . Near syncope 05/03/2018  . Type 2 diabetes mellitus with hyperglycemia, with long-term current use of insulin ([place])   . Condyloma acuminatum of vulva 10/31/2017  . DKA, type 1 ([place]) 10/23/2017  . 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 Frequent No-show for appointment 11/03/2016  . Pyelonephritis 04/17/2016  . Type 1 diabetes mellitus without complication ([place]) 90/24/0973    Past Surgical History:  Procedure Laterality Date  . NO PAST SURGERIES       OB History   No obstetric history on file.      Home Medications    Prior to Admission medications   Medication Sig Start Date End Date Taking? Authorizing Provider  glucagon 1 MG injection Inject 1mg  IM if unconscious, seizing, or unable to eat to correct low blood sugar 08/24/18  Yes Rai, Ripudeep K, MD  insulin aspart (NOVOLOG FLEXPEN) 100 UNIT/ML FlexPen Inject 10 Units into the skin 3 (three) times daily before meals. 10/23/18  Yes Riccio, Angela C, DO  LANTUS SOLOSTAR 100 UNIT/ML Solostar Pen ADMINISTER 30 UNITS UNDER THE SKIN DAILY BEFORE BREAKFAST Patient taking differently: Inject 30 Units into the skin daily.  10/01/18  Yes Lucila Maine C, DO  ACCU-CHEK AVIVA PLUS test strip USE AS DIRECTED 06/18/18   Steve Rattler, DO  ACCU-CHEK FASTCLIX LANCETS MISC Check sugar 10 x daily 06/15/17   Rogue Bussing, MD  acetaminophen (TYLENOL) 325 MG tablet Take 2 tablets (650 mg total) by mouth every 6 (six) hours as needed for mild pain or fever (over the counter). Patient not taking: Reported on 10/13/2018 08/24/18   Mendel Corning, MD  acetone, urine, test strip Check ketones per protocol Patient not taking: Reported on 05/03/2018 09/23/15   Levon Hedger, MD  Alcohol Swabs (ALCOHOL PADS) 70 % PADS Use to wipe skin prior to insulin injections twice daily 09/23/15   Levon Hedger, MD  glucose blood (ACCU-CHEK AVIVA PLUS) test strip Please check blood sugars 4  times a day 05/03/18   Lucila Maine C, DO  Insulin Pen Needle (B-D UF III MINI PEN NEEDLES) 31G X 5 MM MISC CHECK BLOOD SUGAR IN THE MORNING BEFORE EATING, BEFORE EACH MEAL, AND AS NEEDED 06/15/17   Rogue Bussing, MD  Insulin Pen Needle (B-D UF III MINI PEN NEEDLES) 31G X 5 MM MISC USE TO CHECK BLOOD SUGAR IN THE MORNING BEFORE EATING, BEFORE EACH MEAL, AND AS NEEDED 08/14/18   Lucila Maine C, DO  Lancets (ACCU-CHEK SOFT TOUCH) lancets Use as directed. 10/24/17   Kathrene Alu, MD  ondansetron  (ZOFRAN-ODT) 4 MG disintegrating tablet Take 1 tablet (4 mg total) by mouth every 6 (six) hours as needed for nausea or vomiting. Patient not taking: Reported on 11/12/2018 08/24/18   Mendel Corning, MD    Family History Family History  Problem Relation Age of Onset  . Diabetes Maternal Grandmother   . Heart disease Maternal Grandmother        Deceased from MI at age 73  . Hypertension Maternal Grandmother   . Hypercholesterolemia Mother   . Seizures Mother   . Kidney Stones Mother   . Hyperlipidemia Mother   . Stroke Maternal Grandfather        Deceased from stroke at age 64  . Hypertension Paternal Grandmother   . Healthy Father     Social History Social History   Tobacco Use  . Smoking status: Passive Smoke Exposure - Never Smoker  . Smokeless tobacco: Never Used  Substance Use Topics  . Alcohol use: Yes    Comment: Last drank 1 month ago per MD note  . Drug use: Yes    Types: Marijuana    Comment: Last used 1  month ago per MD note     Allergies   Patient has no known allergies.   Review of Systems Review of Systems  All other systems reviewed and are negative.    Physical Exam Updated Vital Signs BP 135/90   Pulse (!) 114   Temp 98 F (36.7 C) (Oral)   Resp 18   LMP 10/14/2018   SpO2 100%   Physical Exam Vitals signs and nursing note reviewed.  Constitutional:      General: She is not in acute distress.    Appearance: She is well-developed.  HENT:     Head: Normocephalic and atraumatic.  Neck:     Musculoskeletal: Normal range of motion.  Cardiovascular:     Rate and Rhythm: Regular rhythm. Tachycardia present.     Heart sounds: Normal heart sounds.  Pulmonary:     Effort: Pulmonary effort is normal.     Breath sounds: Normal breath sounds.  Abdominal:     General: There is no distension.     Palpations: Abdomen is soft.     Tenderness: There is no abdominal tenderness.  Musculoskeletal: Normal range of motion.  Skin:    General: Skin is  warm and dry.  Neurological:     Mental Status: She is alert and oriented to person, place, and time.  Psychiatric:        Judgment: Judgment normal.      ED Treatments / Results  Labs (all labs ordered are listed, but only abnormal results are displayed) Labs Reviewed  CBC - Abnormal; Notable for the following components:      Result Value   WBC 24.0 (*)    RBC 5.43 (*)    Hemoglobin 15.4 (*)    HCT 48.6 (*)  Platelets 462 (*)    All other components within normal limits  URINALYSIS, ROUTINE W REFLEX MICROSCOPIC - Abnormal; Notable for the following components:   APPearance HAZY (*)    Glucose, UA >=500 (*)    Hgb urine dipstick LARGE (*)    Ketones, ur 80 (*)    Protein, ur 30 (*)    RBC / HPF >50 (*)    Bacteria, UA RARE (*)    All other components within normal limits  CBG MONITORING, ED - Abnormal; Notable for the following components:   Glucose-Capillary >600 (*)    All other components within normal limits  CBG MONITORING, ED - Abnormal; Notable for the following components:   Glucose-Capillary >600 (*)    All other components within normal limits  URINE CULTURE  COMPREHENSIVE METABOLIC PANEL  I-STAT BETA HCG BLOOD, ED ([facility], [facility], [facility] ONLY)  I-STAT BETA HCG BLOOD, ED ([facility], [facility], [facility] ONLY)    EKG None  Radiology No results found.  Procedures .Critical Care Performed by: Jola Schmidt, MD Authorized by: Jola Schmidt, MD   Critical care provider statement:    Critical care time (minutes):  32   Critical care was time spent personally by me on the following activities:  Discussions with consultants, evaluation of patient's response to treatment, examination of patient, ordering and performing treatments and interventions, ordering and review of laboratory studies, ordering and review of radiographic studies, pulse oximetry, re-evaluation of patient's condition, obtaining history from patient or surrogate and review of old charts   (including critical care time)   Medications Ordered in ED Medications  morphine 4 MG/ML injection 4 mg (has no administration in time range)  dextrose 5 %-0.45 % sodium chloride infusion (has no administration in time range)  insulin regular, human (MYXREDLIN) 100 units/ 100 mL infusion (has no administration in time range)  sodium chloride 0.9 % bolus 1,000 mL (has no administration in time range)  sodium chloride 0.9 % bolus 1,000 mL (0 mLs Intravenous Stopped 11/12/18 1418)  ondansetron (ZOFRAN) injection 4 mg (4 mg Intravenous Given 11/12/18 1212)  cefTRIAXone (ROCEPHIN) 1 g in sodium chloride 0.9 % 100 mL IVPB (1 g Intravenous New Bag/Given 11/12/18 1405)     Initial Impression / Assessment and Plan / ED Course  I have reviewed the triage vital signs and the nursing notes.  Pertinent labs & imaging results that were available during my care of the patient were reviewed by me and considered in my medical decision making (see chart for details).        Suspect diabetic ketoacidosis.  IV fluid resuscitation now.  Will initiate IV insulin.  Awaiting anion gap for further delineation.  Patient will need admission to the hospital for dehydration and profound hyperglycemia.  Appears to have a urinary tract infection.  Urine culture sent.  IV Rocephin now.  Final Clinical Impressions(s) / ED Diagnoses   Final diagnoses:  None    ED Discharge Orders    None       Jola Schmidt, MD 11/12/18 1623

## 2018-11-12 NOTE — ED Notes (Signed)
ED TO INPATIENT HANDOFF REPORT  ED Nurse Name and Phone #:Sarah,RN  S Name/Age/Gender Sabrina Sutton 23 y.o. female Room/Bed: WA25/WA25  Code Status   Code Status: Prior  Home/SNF/Other Home Patient oriented to: self, place, time and situation Is this baseline? Yes   Triage Complete: Triage complete  Chief Complaint hyperglycemic  Triage Note Patient coming from home with c/o hyperglycemia/abdominal pain. Patient mom state she is non complice with her insulin. She last took her last night. Patient alert and oriented x 4. Patient given Zofran 4 mg IM per ems.   Allergies No Known Allergies  Level of Care/Admitting Diagnosis ED Disposition    ED Disposition Condition [facility] Area: [place] [[id number]]  Level of Care: ICU [6]  Covid Evaluation: N/A  Diagnosis: DKA (diabetic ketoacidoses) ([place]) [329924]  Admitting Physician: Shirlean Mylar  Attending Physician: Dana Allan I [[id number]]  Estimated length of stay: past midnight tomorrow  Certification:: I certify this patient will need inpatient services for at least 2 midnights  PT Class (Do Not Modify): Inpatient [101]  PT Acc Code (Do Not Modify): Private [1]       B Medical/Surgery History Past Medical History:  Diagnosis Date  . Diabetes mellitus without complication ([place]) 2/68/34   + GAD Ab  . DKA (diabetic ketoacidoses) ([place]) 09/20/2015  . Hypokalemia   . Pyelonephritis 04/17/2016   Past Surgical History:  Procedure Laterality Date  . NO PAST SURGERIES       A IV Location/Drains/Wounds Patient Lines/Drains/Airways Status   Active Line/Drains/Airways    Name:   Placement date:   Placement time:   Site:   Days:   Peripheral IV 11/12/18 Left Antecubital   11/12/18    1211    Antecubital   less than 1          Intake/Output Last 24 hours  Intake/Output Summary (Last 24 hours) at 11/12/2018 2045 Last data filed at 11/12/2018 2004 Gross per 24  hour  Intake 1032.91 ml  Output -  Net 1032.91 ml    Labs/Imaging Results for orders placed or performed during the hospital encounter of 11/12/18 (from the past 48 hour(s))  CBG monitoring, ED     Status: Abnormal   Collection Time: 11/12/18 11:39 AM  Result Value Ref Range   Glucose-Capillary >600 (HH) 70 - 99 mg/dL  I-Stat Beta hCG blood, ED ([facility], [facility], [facility] only)     Status: None   Collection Time: 11/12/18 12:10 PM  Result Value Ref Range   I-stat hCG, quantitative <5.0 <5 mIU/mL   Comment 3            Comment:   GEST. AGE      CONC.  (mIU/mL)   <=1 WEEK        5 - 50     2 WEEKS       50 - 500     3 WEEKS       100 - 10,000     4 WEEKS     1,000 - 30,000        FEMALE AND NON-PREGNANT FEMALE:     LESS THAN 5 mIU/mL   CBC     Status: Abnormal   Collection Time: 11/12/18 12:18 PM  Result Value Ref Range   WBC 24.0 (H) 4.0 - 10.5 K/uL   RBC 5.43 (H) 3.87 - 5.11 MIL/uL   Hemoglobin 15.4 (H) 12.0 - 15.0 g/dL   HCT 48.6 (  H) 36.0 - 46.0 %   MCV 89.5 80.0 - 100.0 fL   MCH 28.4 26.0 - 34.0 pg   MCHC 31.7 30.0 - 36.0 g/dL   RDW 14.6 11.5 - 15.5 %   Platelets 462 (H) 150 - 400 K/uL   nRBC 0.0 0.0 - 0.2 %    Comment: Performed at [facility], [place] [address], [place], [place] [address]  Urinalysis, Routine w reflex microscopic     Status: Abnormal   Collection Time: 11/12/18 12:47 PM  Result Value Ref Range   Color, Urine YELLOW YELLOW   APPearance HAZY (A) CLEAR   Specific Gravity, Urine 1.021 1.005 - 1.030   pH 5.0 5.0 - 8.0   Glucose, UA >=500 (A) NEGATIVE mg/dL   Hgb urine dipstick LARGE (A) NEGATIVE   Bilirubin Urine NEGATIVE NEGATIVE   Ketones, ur 80 (A) NEGATIVE mg/dL   Protein, ur 30 (A) NEGATIVE mg/dL   Nitrite NEGATIVE NEGATIVE   Leukocytes,Ua NEGATIVE NEGATIVE   RBC / HPF >50 (H) 0 - 5 RBC/hpf   WBC, UA 21-50 0 - 5 WBC/hpf   Bacteria, UA RARE (A) NONE SEEN   Squamous Epithelial / LPF 0-5 0 - 5   Mucus PRESENT     Comment: Performed at [facility], [place] [address], [place], [place] [address]  CBG monitoring, ED     Status: Abnormal   Collection Time: 11/12/18  2:09 PM  Result Value Ref Range   Glucose-Capillary >600 (HH) 70 - 99 mg/dL  Comprehensive metabolic panel     Status: Abnormal   Collection Time: 11/12/18  4:19 PM  Result Value Ref Range   Sodium 132 (L) 135 - 145 mmol/L   Potassium 6.2 (H) 3.5 - 5.1 mmol/L   Chloride 91 (L) 98 - 111 mmol/L   CO2 <7 (L) 22 - 32 mmol/L    Comment: REPEATED TO VERIFY   Glucose, Bld 853 (HH) 70 - 99 mg/dL    Comment: CRITICAL RESULT CALLED TO, READ BACK BY AND VERIFIED WITH: M.BRILL AT 1730 ON 11/12/18 BY N.THOMPSON    BUN 34 (H) 6 - 20 mg/dL   Creatinine, Ser 1.99 (H) 0.44 - 1.00 mg/dL   Calcium 9.3 8.9 - 10.3 mg/dL   Total Protein 9.5 (H) 6.5 - 8.1 g/dL   Albumin 4.7 3.5 - 5.0 g/dL   AST 50 (H) 15 - 41 U/L   ALT 130 (H) 0 - 44 U/L   Alkaline Phosphatase 200 (H) 38 - 126 U/L   Total Bilirubin 2.3 (H) 0.3 - 1.2 mg/dL   GFR calc non Af Amer 35 (L) >60 mL/min   GFR calc Af Amer 41 (L) >60 mL/min   Anion gap NOT CALCULATED 5 - 15    Comment: Performed at [facility], [place] [address], [place], [place] [address]  CBG monitoring, ED     Status: Abnormal   Collection Time: 11/12/18  4:35 PM  Result Value Ref Range   Glucose-Capillary 480 (H) 70 - 99 mg/dL  CBG monitoring, ED     Status: Abnormal   Collection Time: 11/12/18  5:46 PM  Result Value Ref Range   Glucose-Capillary >600 (HH) 70 - 99 mg/dL  CBG monitoring, ED     Status: Abnormal   Collection Time: 11/12/18  8:00 PM  Result Value Ref Range   Glucose-Capillary 352 (H) 70 - 99 mg/dL   No results found.  Pending Labs [employer] (From admission,  onward)    Start     Ordered   11/12/18 1327  Urine culture  Add-on,   STAT     11/12/18 1326   Signed and Held  Basic metabolic panel  STAT Now then every 4 hours ,   STAT     Signed and Held   Signed and Held  CBC  (heparin)  Once,    R    Comments:  Baseline for heparin therapy IF NOT ALREADY DRAWN.  Notify MD if PLT < 100 K.    Signed and Held   Signed and Held  Creatinine, serum  (heparin)  Once,   R    Comments:  Baseline for heparin therapy IF NOT ALREADY DRAWN.    Signed and Held   Signed and Held  Magnesium  Now then every 4 hours,   R     Signed and Held   Signed and Held  Phosphorus  Now then every 4 hours,   R     Signed and Held   Signed and Held  Hemoglobin A1c  Once,   R     Signed and Held   Signed and Held  Hepatic function panel  Tomorrow morning,   R     Signed and Held          Vitals/Pain Today's Vitals   11/12/18 1830 11/12/18 1900 11/12/18 1930 11/12/18 2000  BP: 126/87 125/81 120/80 128/78  Pulse: (!) 130 (!) 134 (!) 133 (!) 137  Resp: (!) 22 (!) 22 18 (!) 27  Temp:      TempSrc:      SpO2: 98% 99% 99% 99%  Weight:      Height:      PainSc:        Isolation Precautions No active isolations  Medications Medications  dextrose 5 %-0.45 % sodium chloride infusion ( Intravenous Hold 11/12/18 1636)  insulin regular, human (MYXREDLIN) 100 units/ 100 mL infusion ( Intravenous Rate/Dose Verify 11/12/18 2004)  sodium chloride 0.9 % bolus 1,000 mL (0 mLs Intravenous Stopped 11/12/18 1418)  ondansetron (ZOFRAN) injection 4 mg (4 mg Intravenous Given 11/12/18 1212)  cefTRIAXone (ROCEPHIN) 1 g in sodium chloride 0.9 % 100 mL IVPB (0 g Intravenous Stopped 11/12/18 1623)  morphine 4 MG/ML injection 4 mg (4 mg Intravenous Given 11/12/18 1620)  sodium chloride 0.9 % bolus 1,000 mL (1,000 mLs Intravenous New Bag/Given 11/12/18 1636)    Mobility walks Low fall risk   Focused Assessments Cardiac Assessment Handoff:  Cardiac Rhythm: Sinus tachycardia Lab Results  Component Value Date   TROPONINI <0.03 10/14/2018   No results found for: DDIMER Does the Patient currently have chest pain? No     R Recommendations: See Admitting Provider Note  Report given to:   Additional Notes:

## 2018-11-13 LAB — BASIC METABOLIC PANEL
Anion gap: 11 (ref 5–15)
Anion gap: 20 — ABNORMAL HIGH (ref 5–15)
Anion gap: 14 (ref 5–15)
Anion gap: 17 — ABNORMAL HIGH (ref 5–15)
Anion gap: 11 (ref 5–15)
Anion gap: 15 (ref 5–15)
BUN: 36 mg/dL — ABNORMAL HIGH (ref 6–20)
BUN: 23 mg/dL — ABNORMAL HIGH (ref 6–20)
BUN: 15 mg/dL (ref 6–20)
BUN: 13 mg/dL (ref 6–20)
BUN: 11 mg/dL (ref 6–20)
BUN: 9 mg/dL (ref 6–20)
CO2: 18 mmol/L — ABNORMAL LOW (ref 22–32)
CO2: 12 mmol/L — ABNORMAL LOW (ref 22–32)
CO2: 16 mmol/L — ABNORMAL LOW (ref 22–32)
CO2: 15 mmol/L — ABNORMAL LOW (ref 22–32)
CO2: 19 mmol/L — ABNORMAL LOW (ref 22–32)
CO2: 16 mmol/L — ABNORMAL LOW (ref 22–32)
Calcium: 7.5 mg/dL — ABNORMAL LOW (ref 8.9–10.3)
Calcium: 9 mg/dL (ref 8.9–10.3)
Calcium: 8.8 mg/dL — ABNORMAL LOW (ref 8.9–10.3)
Calcium: 9 mg/dL (ref 8.9–10.3)
Calcium: 9 mg/dL (ref 8.9–10.3)
Calcium: 9.2 mg/dL (ref 8.9–10.3)
Chloride: 103 mmol/L (ref 98–111)
Chloride: 102 mmol/L (ref 98–111)
Chloride: 102 mmol/L (ref 98–111)
Chloride: 101 mmol/L (ref 98–111)
Chloride: 104 mmol/L (ref 98–111)
Chloride: 102 mmol/L (ref 98–111)
Creatinine, Ser: 3.63 mg/dL — ABNORMAL HIGH (ref 0.44–1.00)
Creatinine, Ser: 1.15 mg/dL — ABNORMAL HIGH (ref 0.44–1.00)
Creatinine, Ser: 0.81 mg/dL (ref 0.44–1.00)
Creatinine, Ser: 0.72 mg/dL (ref 0.44–1.00)
Creatinine, Ser: 0.71 mg/dL (ref 0.44–1.00)
Creatinine, Ser: 0.7 mg/dL (ref 0.44–1.00)
GFR calc Af Amer: 20 mL/min — ABNORMAL LOW (ref >60)
GFR calc Af Amer: >60 mL/min (ref >60)
GFR calc Af Amer: >60 mL/min (ref >60)
GFR calc Af Amer: >60 mL/min (ref >60)
GFR calc Af Amer: >60 mL/min (ref >60)
GFR calc Af Amer: >60 mL/min (ref >60)
GFR calc non Af Amer: 17 mL/min — ABNORMAL LOW (ref >60)
GFR calc non Af Amer: >60 mL/min (ref >60)
GFR calc non Af Amer: >60 mL/min (ref >60)
GFR calc non Af Amer: >60 mL/min (ref >60)
GFR calc non Af Amer: >60 mL/min (ref >60)
GFR calc non Af Amer: >60 mL/min (ref >60)
Glucose, Bld: 111 mg/dL — ABNORMAL HIGH (ref 70–99)
Glucose, Bld: 153 mg/dL — ABNORMAL HIGH (ref 70–99)
Glucose, Bld: 186 mg/dL — ABNORMAL HIGH (ref 70–99)
Glucose, Bld: 188 mg/dL — ABNORMAL HIGH (ref 70–99)
Glucose, Bld: 137 mg/dL — ABNORMAL HIGH (ref 70–99)
Glucose, Bld: 162 mg/dL — ABNORMAL HIGH (ref 70–99)
Potassium: 3.8 mmol/L (ref 3.5–5.1)
Potassium: 3.6 mmol/L (ref 3.5–5.1)
Potassium: 3.3 mmol/L — ABNORMAL LOW (ref 3.5–5.1)
Potassium: 3.6 mmol/L (ref 3.5–5.1)
Potassium: 3.5 mmol/L (ref 3.5–5.1)
Potassium: 4.3 mmol/L (ref 3.5–5.1)
Sodium: 132 mmol/L — ABNORMAL LOW (ref 135–145)
Sodium: 134 mmol/L — ABNORMAL LOW (ref 135–145)
Sodium: 132 mmol/L — ABNORMAL LOW (ref 135–145)
Sodium: 133 mmol/L — ABNORMAL LOW (ref 135–145)
Sodium: 134 mmol/L — ABNORMAL LOW (ref 135–145)
Sodium: 133 mmol/L — ABNORMAL LOW (ref 135–145)

## 2018-11-13 LAB — FERRITIN: Ferritin: 143 ng/mL (ref 11–307)

## 2018-11-13 LAB — CBC
HCT: 28.5 % — ABNORMAL LOW (ref 36.0–46.0)
Hemoglobin: 8.7 g/dL — ABNORMAL LOW (ref 12.0–15.0)
MCH: 20.8 pg — ABNORMAL LOW (ref 26.0–34.0)
MCHC: 30.5 g/dL (ref 30.0–36.0)
MCV: 68 fL — ABNORMAL LOW (ref 80.0–100.0)
Platelets: 236 10*3/uL (ref 150–400)
RBC: 4.19 MIL/uL (ref 3.87–5.11)
RDW: 22.6 % — ABNORMAL HIGH (ref 11.5–15.5)
WBC: 16.3 10*3/uL — ABNORMAL HIGH (ref 4.0–10.5)

## 2018-11-13 LAB — GLUCOSE, CAPILLARY
Glucose-Capillary: 121 mg/dL — ABNORMAL HIGH (ref 70–99)
Glucose-Capillary: 126 mg/dL — ABNORMAL HIGH (ref 70–99)
Glucose-Capillary: 129 mg/dL — ABNORMAL HIGH (ref 70–99)
Glucose-Capillary: 132 mg/dL — ABNORMAL HIGH (ref 70–99)
Glucose-Capillary: 141 mg/dL — ABNORMAL HIGH (ref 70–99)
Glucose-Capillary: 142 mg/dL — ABNORMAL HIGH (ref 70–99)
Glucose-Capillary: 144 mg/dL — ABNORMAL HIGH (ref 70–99)
Glucose-Capillary: 144 mg/dL — ABNORMAL HIGH (ref 70–99)
Glucose-Capillary: 146 mg/dL — ABNORMAL HIGH (ref 70–99)
Glucose-Capillary: 149 mg/dL — ABNORMAL HIGH (ref 70–99)
Glucose-Capillary: 150 mg/dL — ABNORMAL HIGH (ref 70–99)
Glucose-Capillary: 154 mg/dL — ABNORMAL HIGH (ref 70–99)
Glucose-Capillary: 163 mg/dL — ABNORMAL HIGH (ref 70–99)
Glucose-Capillary: 163 mg/dL — ABNORMAL HIGH (ref 70–99)
Glucose-Capillary: 169 mg/dL — ABNORMAL HIGH (ref 70–99)
Glucose-Capillary: 169 mg/dL — ABNORMAL HIGH (ref 70–99)
Glucose-Capillary: 177 mg/dL — ABNORMAL HIGH (ref 70–99)
Glucose-Capillary: 177 mg/dL — ABNORMAL HIGH (ref 70–99)
Glucose-Capillary: 190 mg/dL — ABNORMAL HIGH (ref 70–99)
Glucose-Capillary: 191 mg/dL — ABNORMAL HIGH (ref 70–99)
Glucose-Capillary: 200 mg/dL — ABNORMAL HIGH (ref 70–99)
Glucose-Capillary: 542 mg/dL (ref 70–99)

## 2018-11-13 LAB — HEPATIC FUNCTION PANEL
ALT: 107 U/L — ABNORMAL HIGH (ref 0–44)
AST: 35 U/L (ref 15–41)
Albumin: 4.5 g/dL (ref 3.5–5.0)
Alkaline Phosphatase: 163 U/L — ABNORMAL HIGH (ref 38–126)
Bilirubin, Direct: <0.1 mg/dL (ref 0.0–0.2)
Total Bilirubin: 1.5 mg/dL — ABNORMAL HIGH (ref 0.3–1.2)
Total Protein: 8.5 g/dL — ABNORMAL HIGH (ref 6.5–8.1)

## 2018-11-13 LAB — PHOSPHORUS
Phosphorus: 4.5 mg/dL (ref 2.5–4.6)
Phosphorus: 2 mg/dL — ABNORMAL LOW (ref 2.5–4.6)

## 2018-11-13 LAB — URINE CULTURE

## 2018-11-13 LAB — IRON AND TIBC
Iron: 128 ug/dL (ref 28–170)
Saturation Ratios: 40 % — ABNORMAL HIGH (ref 10.4–31.8)
TIBC: 319 ug/dL (ref 250–450)
UIBC: 191 ug/dL

## 2018-11-13 LAB — HEMOGLOBIN A1C
Hgb A1c MFr Bld: 5.4 % (ref 4.8–5.6)
Mean Plasma Glucose: 108 mg/dL

## 2018-11-13 LAB — MAGNESIUM
Magnesium: 2.3 mg/dL (ref 1.7–2.4)
Magnesium: 2.3 mg/dL (ref 1.7–2.4)
Magnesium: 2.2 mg/dL (ref 1.7–2.4)
Magnesium: 2.3 mg/dL (ref 1.7–2.4)
Magnesium: 2.2 mg/dL (ref 1.7–2.4)

## 2018-11-13 MED ORDER — INSULIN GLARGINE 100 UNIT/ML ~~LOC~~ SOLN
30.0000 [IU] | Freq: Every day | SUBCUTANEOUS | Status: DC
  Administered 2018-11-13: 30 [IU] via SUBCUTANEOUS
  Filled 2018-11-13: qty 0.3

## 2018-11-13 MED ORDER — INSULIN GLARGINE 100 UNIT/ML ~~LOC~~ SOLN: 30.0000 [IU] | Freq: Every day | SUBCUTANEOUS | Status: DC

## 2018-11-13 MED ORDER — CHLORHEXIDINE GLUCONATE CLOTH 2 % EX PADS: 6.0000 | MEDICATED_PAD | Freq: Every day | CUTANEOUS | Status: DC

## 2018-11-13 MED ORDER — POTASSIUM CHLORIDE 10 MEQ/100ML IV SOLN
10.0000 meq | INTRAVENOUS | Status: AC
  Administered 2018-11-13 (×4): 10 meq via INTRAVENOUS
  Filled 2018-11-13 (×4): qty 100

## 2018-11-13 MED ORDER — ONDANSETRON HCL 4 MG/2ML IJ SOLN
4.0000 mg | Freq: Four times a day (QID) | INTRAMUSCULAR | Status: DC | PRN
  Administered 2018-11-13 – 2018-11-16 (×8): 4 mg via INTRAVENOUS
  Filled 2018-11-13 (×8): qty 2

## 2018-11-13 MED ORDER — INSULIN ASPART 100 UNIT/ML ~~LOC~~ SOLN: 0.0000 [IU] | Freq: Three times a day (TID) | SUBCUTANEOUS | Status: DC

## 2018-11-13 MED ORDER — INSULIN ASPART 100 UNIT/ML ~~LOC~~ SOLN: 0.0000 [IU] | Freq: Every day | SUBCUTANEOUS | Status: DC

## 2018-11-13 NOTE — Progress Notes (Signed)
PROGRESS NOTE    Sabrina Sutton  KYH:[id number] DOB: 10-23-1995 DOA: 11/12/2018 PCP: Steve Rattler, DO     Brief Narrative:  Sabrina Sutton is a 23 year old African-American female, diagnosed with diabetes mellitus since the age of 47, on subcutaneous Lantus 30 units once daily and subcu NovoLog 10 units before meals.  Patient is noncompliant with insulin therapy.  Patient has been admitted repeatedly for DKA.  Other medical history includes pyelonephritis and hypokalemia.  Patient presents with nausea, vomiting and abdominal pain.  According to the patient, she has not stopped vomiting since yesterday, and has not been able to keep anything down.  On presentation to the hospital, the blood sugar was 853, with CO2 of less than 7.  Sodium of 132, potassium of 6.2, serum creatinine of 1.99 (baseline of 0.67), total protein of 9.5 with albumin of 4.7, ALT of 130 and AST of 50 and alkaline phosphatase of 200.  WBC is 24.  Patient was admitted for DKA and was started on insulin drip.  New events last 24 hours / Subjective: Continues to be on insulin drip this morning, labs pending this morning.  She had episode of nausea and vomiting last night, none this morning.  Assessment & Plan:   Active Problems:   DKA (diabetic ketoacidoses) ([place])   DKA, type 1 diabetes -Secondary to noncompliance -Continue IV insulin, IVF, monitor labs  Hyperkalemia -Resolved  Acute kidney injury -Improving, monitor BMP  Elevated liver enzymes -Improved  Leukocytosis, polycythemia, thrombocytosis -Secondary to volume depletion, improving with IV fluids -Check iron studies due to microcytic anemia   DVT prophylaxis: Subcutaneous heparin Code Status: Full code Family Communication: None Disposition Plan: Pending improvement in DKA.  Remain in stepdown unit while on insulin drip   Consultants:   None  Procedures:   None  Antimicrobials:  Anti-infectives (From admission, onward)   Start      Dose/Rate Route Frequency Ordered Stop   11/12/18 1330  cefTRIAXone (ROCEPHIN) 1 g in sodium chloride 0.9 % 100 mL IVPB     1 g 200 mL/hr over 30 Minutes Intravenous  Once 11/12/18 1328 11/12/18 1623       Objective: Vitals:   11/13/18 0359 11/13/18 0500 11/13/18 0800 11/13/18 0830  BP:  112/68 123/68   Pulse:  (!) 119 (!) 125 (!) 114  Resp:  17 (!) 22 17  Temp: 98.9 F (37.2 C)     TempSrc: Oral     SpO2:  100% 100% 100%  Weight:  52.6 kg    Height:        Intake/Output Summary (Last 24 hours) at 11/13/2018 1009 Last data filed at 11/13/2018 0300 Gross per 24 hour  Intake 1774.06 ml  Output -  Net 1774.06 ml   Filed Weights   11/12/18 1639 11/12/18 2107 11/13/18 0500  Weight: 58.5 kg 51.3 kg 52.6 kg    Examination: General exam: Appears calm and comfortable  Respiratory system: Clear to auscultation. Respiratory effort normal. Cardiovascular system: S1 & S2 heard, tachycardic rate, regular rhythm Gastrointestinal system: Abdomen is nondistended, soft and nontender. No organomegaly or masses felt. Normal bowel sounds heard. Central nervous system: Alert and oriented. No focal neurological deficits. Extremities: Symmetric Skin: No rashes, lesions or ulcers Psychiatry: Judgement and insight appear normal. Mood & affect appropriate.   Data Reviewed: I have personally reviewed following labs and imaging studies  CBC: Recent Labs  Lab 11/12/18 1218 11/12/18 2318  WBC 24.0* 16.3*  HGB 15.4* 8.7*  HCT 48.6* 28.5*  MCV 89.5 68.0*  PLT 462* 829   Basic Metabolic Panel: Recent Labs  Lab 11/12/18 1619 11/12/18 2318 11/13/18 0231  NA 132* 132* 134*  K 6.2* 3.8 3.6  CL 91* 103 102  CO2 <7* 18* 12*  GLUCOSE 853* 111* 153*  BUN 34* 36* 23*  CREATININE 1.99* 3.63* 1.15*  CALCIUM 9.3 7.5* 9.0  MG  --  2.3 2.3  PHOS  --  4.5 2.0*   GFR: Estimated Creatinine Clearance: 64.8 mL/min (A) (by C-G formula based on SCr of 1.15 mg/dL (H)). Liver Function Tests:  Recent Labs  Lab 11/12/18 1619 11/13/18 0231  AST 50* 35  ALT 130* 107*  ALKPHOS 200* 163*  BILITOT 2.3* 1.5*  PROT 9.5* 8.5*  ALBUMIN 4.7 4.5   No results for input(s): LIPASE, AMYLASE in the last 168 hours. No results for input(s): AMMONIA in the last 168 hours. Coagulation Profile: No results for input(s): INR, PROTIME in the last 168 hours. Cardiac Enzymes: No results for input(s): CKTOTAL, CKMB, CKMBINDEX, TROPONINI in the last 168 hours. BNP (last 3 results) No results for input(s): PROBNP in the last 8760 hours. HbA1C: No results for input(s): HGBA1C in the last 72 hours. CBG: Recent Labs  Lab 11/13/18 0540 11/13/18 0636 11/13/18 0736 11/13/18 0835 11/13/18 0935  GLUCAP 150* 132* 163* 169* 191*   Lipid Profile: No results for input(s): CHOL, HDL, LDLCALC, TRIG, CHOLHDL, LDLDIRECT in the last 72 hours. Thyroid Function Tests: No results for input(s): TSH, T4TOTAL, FREET4, T3FREE, THYROIDAB in the last 72 hours. Anemia Panel: No results for input(s): VITAMINB12, FOLATE, FERRITIN, TIBC, IRON, RETICCTPCT in the last 72 hours. Sepsis Labs: No results for input(s): PROCALCITON, LATICACIDVEN in the last 168 hours.  Recent Results (from the past 240 hour(s))  MRSA PCR Screening     Status: None   Collection Time: 11/12/18 10:08 PM  Result Value Ref Range Status   MRSA by PCR NEGATIVE NEGATIVE Final    Comment:        The GeneXpert MRSA Assay (FDA approved for NASAL specimens only), is one component of a comprehensive MRSA colonization surveillance program. It is not intended to diagnose MRSA infection nor to guide or monitor treatment for MRSA infections. Performed at [facility], [place] [address], [place], [place] [address]        Radiology Studies: No results found.    Scheduled Meds: . Chlorhexidine Gluconate Cloth  6 each Topical Daily  . heparin  5,000 Units Subcutaneous Q8H   Continuous Infusions: . sodium chloride    .  dextrose 5 % and 0.45% NaCl 75 mL/hr at 11/13/18 0300  . dextrose 5 % and 0.45% NaCl 100 mL/hr at 11/12/18 2321  . insulin 2.6 Units/hr (11/12/18 2327)     LOS: 1 day    Time spent: 35 minutes   Dessa Phi, DO [facility] [email or web address] 11/13/2018, 10:09 AM

## 2018-11-14 LAB — BASIC METABOLIC PANEL
Anion gap: 13 (ref 5–15)
Anion gap: 16 — ABNORMAL HIGH (ref 5–15)
Anion gap: 18 — ABNORMAL HIGH (ref 5–15)
Anion gap: 20 — ABNORMAL HIGH (ref 5–15)
Anion gap: 24 — ABNORMAL HIGH (ref 5–15)
BUN: 10 mg/dL (ref 6–20)
BUN: 10 mg/dL (ref 6–20)
BUN: 11 mg/dL (ref 6–20)
BUN: 9 mg/dL (ref 6–20)
BUN: 9 mg/dL (ref 6–20)
CO2: 13 mmol/L — ABNORMAL LOW (ref 22–32)
CO2: 17 mmol/L — ABNORMAL LOW (ref 22–32)
CO2: 19 mmol/L — ABNORMAL LOW (ref 22–32)
CO2: 23 mmol/L (ref 22–32)
CO2: 9 mmol/L — ABNORMAL LOW (ref 22–32)
Calcium: 8.8 mg/dL — ABNORMAL LOW (ref 8.9–10.3)
Calcium: 8.9 mg/dL (ref 8.9–10.3)
Calcium: 9 mg/dL (ref 8.9–10.3)
Calcium: 9 mg/dL (ref 8.9–10.3)
Calcium: 9.8 mg/dL (ref 8.9–10.3)
Chloride: 100 mmol/L (ref 98–111)
Chloride: 100 mmol/L (ref 98–111)
Chloride: 100 mmol/L (ref 98–111)
Chloride: 102 mmol/L (ref 98–111)
Chloride: 102 mmol/L (ref 98–111)
Creatinine, Ser: 0.74 mg/dL (ref 0.44–1.00)
Creatinine, Ser: 0.75 mg/dL (ref 0.44–1.00)
Creatinine, Ser: 0.78 mg/dL (ref 0.44–1.00)
Creatinine, Ser: 0.86 mg/dL (ref 0.44–1.00)
Creatinine, Ser: 0.88 mg/dL (ref 0.44–1.00)
GFR calc Af Amer: >60 mL/min (ref >60)
GFR calc Af Amer: >60 mL/min (ref >60)
GFR calc Af Amer: >60 mL/min (ref >60)
GFR calc Af Amer: >60 mL/min (ref >60)
GFR calc Af Amer: >60 mL/min (ref >60)
GFR calc non Af Amer: >60 mL/min (ref >60)
GFR calc non Af Amer: >60 mL/min (ref >60)
GFR calc non Af Amer: >60 mL/min (ref >60)
GFR calc non Af Amer: >60 mL/min (ref >60)
GFR calc non Af Amer: >60 mL/min (ref >60)
Glucose, Bld: 163 mg/dL — ABNORMAL HIGH (ref 70–99)
Glucose, Bld: 184 mg/dL — ABNORMAL HIGH (ref 70–99)
Glucose, Bld: 247 mg/dL — ABNORMAL HIGH (ref 70–99)
Glucose, Bld: 256 mg/dL — ABNORMAL HIGH (ref 70–99)
Glucose, Bld: 419 mg/dL — ABNORMAL HIGH (ref 70–99)
Potassium: 3.4 mmol/L — ABNORMAL LOW (ref 3.5–5.1)
Potassium: 3.5 mmol/L (ref 3.5–5.1)
Potassium: 3.6 mmol/L (ref 3.5–5.1)
Potassium: 3.9 mmol/L (ref 3.5–5.1)
Potassium: 3.9 mmol/L (ref 3.5–5.1)
Sodium: 131 mmol/L — ABNORMAL LOW (ref 135–145)
Sodium: 133 mmol/L — ABNORMAL LOW (ref 135–145)
Sodium: 137 mmol/L (ref 135–145)
Sodium: 137 mmol/L (ref 135–145)
Sodium: 138 mmol/L (ref 135–145)

## 2018-11-14 LAB — GLUCOSE, CAPILLARY
Glucose-Capillary: 286 mg/dL — ABNORMAL HIGH (ref 70–99)
Glucose-Capillary: 416 mg/dL — ABNORMAL HIGH (ref 70–99)
Glucose-Capillary: 245 mg/dL — ABNORMAL HIGH (ref 70–99)
Glucose-Capillary: 222 mg/dL — ABNORMAL HIGH (ref 70–99)
Glucose-Capillary: 254 mg/dL — ABNORMAL HIGH (ref 70–99)
Glucose-Capillary: 221 mg/dL — ABNORMAL HIGH (ref 70–99)
Glucose-Capillary: 176 mg/dL — ABNORMAL HIGH (ref 70–99)
Glucose-Capillary: 160 mg/dL — ABNORMAL HIGH (ref 70–99)
Glucose-Capillary: 176 mg/dL — ABNORMAL HIGH (ref 70–99)
Glucose-Capillary: 129 mg/dL — ABNORMAL HIGH (ref 70–99)
Glucose-Capillary: 112 mg/dL — ABNORMAL HIGH (ref 70–99)
Glucose-Capillary: 140 mg/dL — ABNORMAL HIGH (ref 70–99)
Glucose-Capillary: 185 mg/dL — ABNORMAL HIGH (ref 70–99)

## 2018-11-14 LAB — MAGNESIUM
Magnesium: 2 mg/dL (ref 1.7–2.4)
Magnesium: 2 mg/dL (ref 1.7–2.4)

## 2018-11-14 LAB — CBC
HCT: 40.2 % (ref 36.0–46.0)
Hemoglobin: 13.6 g/dL (ref 12.0–15.0)
MCH: 28.9 pg (ref 26.0–34.0)
MCHC: 33.8 g/dL (ref 30.0–36.0)
MCV: 85.4 fL (ref 80.0–100.0)
Platelets: 310 10*3/uL (ref 150–400)
RBC: 4.71 MIL/uL (ref 3.87–5.11)
RDW: 14.3 % (ref 11.5–15.5)
WBC: 14 10*3/uL — ABNORMAL HIGH (ref 4.0–10.5)
nRBC: 0 % (ref 0.0–0.2)

## 2018-11-14 MED ORDER — POTASSIUM CHLORIDE 10 MEQ/100ML IV SOLN
10.0000 meq | INTRAVENOUS | Status: AC
  Administered 2018-11-14 (×4): 10 meq via INTRAVENOUS
  Filled 2018-11-14 (×4): qty 100

## 2018-11-14 MED ORDER — INSULIN ASPART 100 UNIT/ML ~~LOC~~ SOLN
0.0000 [IU] | SUBCUTANEOUS | Status: DC
  Administered 2018-11-14: 9 [IU] via SUBCUTANEOUS

## 2018-11-14 MED ORDER — SODIUM CHLORIDE 0.9% FLUSH
10.0000 mL | Freq: Two times a day (BID) | INTRAVENOUS | Status: DC
  Administered 2018-11-15: 10 mL

## 2018-11-14 MED ORDER — DEXTROSE-NACL 5-0.45 % IV SOLN
INTRAVENOUS | Status: DC
  Administered 2018-11-14 (×2): via INTRAVENOUS

## 2018-11-14 MED ORDER — SODIUM CHLORIDE 0.9 % IV SOLN
INTRAVENOUS | Status: DC
  Administered 2018-11-14: 12:00:00 via INTRAVENOUS

## 2018-11-14 MED ORDER — PHENOL 1.4 % MT LIQD
1.0000 | OROMUCOSAL | Status: DC | PRN
  Filled 2018-11-14: qty 177

## 2018-11-14 MED ORDER — DEXTROSE 50 % IV SOLN: 25.0000 mL | INTRAVENOUS | Status: DC | PRN

## 2018-11-14 MED ORDER — PROMETHAZINE HCL 25 MG/ML IJ SOLN
12.5000 mg | Freq: Four times a day (QID) | INTRAMUSCULAR | Status: DC | PRN
  Administered 2018-11-14 – 2018-11-16 (×6): 12.5 mg via INTRAVENOUS
  Filled 2018-11-14 (×7): qty 1

## 2018-11-14 MED ORDER — SODIUM CHLORIDE 0.9% FLUSH
10.0000 mL | INTRAVENOUS | Status: DC | PRN
  Administered 2018-11-14: 10 mL
  Filled 2018-11-14: qty 40

## 2018-11-14 MED ORDER — INSULIN REGULAR(HUMAN) IN NACL 100-0.9 UT/100ML-% IV SOLN
INTRAVENOUS | Status: DC
  Administered 2018-11-14: 1.6 [IU]/h via INTRAVENOUS
  Filled 2018-11-14: qty 100

## 2018-11-14 MED ORDER — CHLORHEXIDINE GLUCONATE CLOTH 2 % EX PADS: 6.0000 | MEDICATED_PAD | Freq: Every day | CUTANEOUS | Status: DC

## 2018-11-14 MED ORDER — INSULIN REGULAR BOLUS VIA INFUSION: 0.0000 [IU] | Freq: Three times a day (TID) | INTRAVENOUS | Status: DC

## 2018-11-14 NOTE — Progress Notes (Signed)
Patient started on the insulin drip.  Report given to Raven and transported to stepdown.

## 2018-11-14 NOTE — Progress Notes (Addendum)
Inpatient Diabetes Program Recommendations  AACE/ADA: New Consensus Statement on Inpatient Glycemic Control (2015)  Target Ranges:  Prepandial:   less than 140 mg/dL      Peak postprandial:   less than 180 mg/dL (1-2 hours)      Critically ill patients:  140 - 180 mg/dL   Lab Results  Component Value Date   GLUCAP 416 (H) 11/14/2018   HGBA1C 5.4 11/12/2018    Review of Glycemic Control  Diabetes history: DM2 Outpatient Diabetes medications: Lantus 30 units QD, Novolog 10 units tidwc Current orders for Inpatient glycemic control: Lantus 30 units QD, Novolog 0-9 units Q4H.  HgbA1C - 5.4% - likely not accurate d/t low H/H. Last one 10/14/18 was 11.7%. CBG this am - 286-416.  Seen by Diabetes Coordinator on 3/23 regarding importance of taking insulin as prescribed and checking her blood sugars at home. Pt states she doesn't like to stick herself. Also seen on 08/23/18 by undersigned regarding HgbA1C and info regarding insulin pump. Does not appear that pt ever followed up with PCP for diabetes management.  Inpatient Diabetes Program Recommendations:     Add Novolog 5 units tidwc for meal coverage insulin if pt eats > 50% meal. Speak with pt regarding her f/u with PCP regarding uncontrolled diabetes.  Addendum - Labs reveal DKA and pt transferred back to SDU for IV insulin drip. Did not get to talk with pt today.  Will continue to follow.  Thank you. Lorenda Peck, RD, LDN, CDE Inpatient Diabetes Coordinator [phone]

## 2018-11-14 NOTE — Progress Notes (Signed)
PROGRESS NOTE    Sabrina Sutton  JOI:[id number] DOB: August 02, 1995 DOA: 11/12/2018 PCP: Steve Rattler, DO     Brief Narrative:  Sabrina Sutton is a 23 year old African-American female, diagnosed with diabetes mellitus since the age of 29, on subcutaneous Lantus 30 units once daily and subcu NovoLog 10 units before meals.  Patient is noncompliant with insulin therapy.  Patient has been admitted repeatedly for DKA.  Other medical history includes pyelonephritis and hypokalemia.  Patient presents with nausea, vomiting and abdominal pain.  According to the patient, she has not stopped vomiting since yesterday, and has not been able to keep anything down.  On presentation to the hospital, the blood sugar was 853, with CO2 of less than 7.  Sodium of 132, potassium of 6.2, serum creatinine of 1.99 (baseline of 0.67), total protein of 9.5 with albumin of 4.7, ALT of 130 and AST of 50 and alkaline phosphatase of 200.  WBC is 24.  Patient was admitted for DKA and was started on insulin drip.  New events last 24 hours / Subjective: Patient's anion gap closed yesterday evening, transition to subcutaneous insulin.  However this morning, patient has been having dry heaves, blood sugar has been elevated up to the 400s, anion gap elevated now to 24.  Assessment & Plan:   Active Problems:   DKA (diabetic ketoacidoses) ([place])   DKA, type 1 diabetes -Secondary to noncompliance -Restart IV insulin, monitor BMP every 4 hours  Hypokalemia -Replace, trend  Acute kidney injury -Improving, monitor BMP  Elevated liver enzymes -Improved  Leukocytosis, polycythemia, thrombocytosis -Secondary to volume depletion, improving with IV fluids   DVT prophylaxis: Subcutaneous heparin Code Status: Full code Family Communication: None Disposition Plan: Transfer back to stepdown unit today for insulin drip   Consultants:   None  Procedures:   None  Antimicrobials:  Anti-infectives (From admission,  onward)   Start     Dose/Rate Route Frequency Ordered Stop   11/12/18 1330  cefTRIAXone (ROCEPHIN) 1 g in sodium chloride 0.9 % 100 mL IVPB     1 g 200 mL/hr over 30 Minutes Intravenous  Once 11/12/18 1328 11/12/18 1623       Objective: Vitals:   11/14/18 0000 11/14/18 0400 11/14/18 0537 11/14/18 0813  BP:  121/77 122/84 125/88  Pulse: (!) 113 (!) 101 91 (!) 109  Resp: 19 20 19 14   Temp: 98.6 F (37 C) 98.5 F (36.9 C) 97.7 F (36.5 C) 97.9 F (36.6 C)  TempSrc: Oral Oral Oral Oral  SpO2: 100% 99% 100% 100%  Weight:      Height:       No intake or output data in the 24 hours ending 11/14/18 1132 Filed Weights   11/12/18 1639 11/12/18 2107 11/13/18 0500  Weight: 58.5 kg 51.3 kg 52.6 kg    Examination: General exam: Appears calm and comfortable, weak Respiratory system: Clear to auscultation. Respiratory effort normal. Cardiovascular system: S1 & S2 heard, RRR. No JVD, murmurs, rubs, gallops or clicks. No pedal edema. Gastrointestinal system: Abdomen is nondistended, soft and nontender. No organomegaly or masses felt. Normal bowel sounds heard. Central nervous system: Alert and oriented. No focal neurological deficits. Extremities: Symmetric 5 x 5 power. Skin: No rashes, lesions or ulcers Psychiatry: Judgement and insight appear normal. Mood & affect appropriate.   Data Reviewed: I have personally reviewed following labs and imaging studies  CBC: Recent Labs  Lab 11/12/18 1218 11/12/18 2318 11/14/18 0039  WBC 24.0* 16.3* 14.0*  HGB 15.4* 8.7* 13.6  HCT 48.6* 28.5* 40.2  MCV 89.5 68.0* 85.4  PLT 462* 236 035   Basic Metabolic Panel: Recent Labs  Lab 11/12/18 2318 11/13/18 0231  11/13/18 1338 11/13/18 1659 11/13/18 2044 11/14/18 0039 11/14/18 0855  NA 132* 134*   < > 133* 134* 133* 131* 133*  K 3.8 3.6   < > 3.6 3.5 4.3 3.6 3.4*  CL 103 102   < > 101 104 102 100 100  CO2 18* 12*   < > 15* 19* 16* 13* 9*  GLUCOSE 111* 153*   < > 188* 137* 162* 256* 419*   BUN 36* 23*   < > 13 11 9 9 10   CREATININE 3.63* 1.15*   < > 0.72 0.71 0.70 0.74 0.86  CALCIUM 7.5* 9.0   < > 9.0 9.0 9.2 9.0 8.8*  MG 2.3 2.3  --  2.2 2.3 2.2 2.0 2.0  PHOS 4.5 2.0*  --   --   --   --   --   --    < > = values in this interval not displayed.   GFR: Estimated Creatinine Clearance: 86.6 mL/min (by C-G formula based on SCr of 0.86 mg/dL). Liver Function Tests: Recent Labs  Lab 11/12/18 1619 11/13/18 0231  AST 50* 35  ALT 130* 107*  ALKPHOS 200* 163*  BILITOT 2.3* 1.5*  PROT 9.5* 8.5*  ALBUMIN 4.7 4.5   No results for input(s): LIPASE, AMYLASE in the last 168 hours. No results for input(s): AMMONIA in the last 168 hours. Coagulation Profile: No results for input(s): INR, PROTIME in the last 168 hours. Cardiac Enzymes: No results for input(s): CKTOTAL, CKMB, CKMBINDEX, TROPONINI in the last 168 hours. BNP (last 3 results) No results for input(s): PROBNP in the last 8760 hours. HbA1C: Recent Labs    11/12/18 2318  HGBA1C 5.4   CBG: Recent Labs  Lab 11/13/18 2025 11/13/18 2132 11/14/18 0540 11/14/18 0757 11/14/18 1118  GLUCAP 146* 200* 286* 416* 245*   Lipid Profile: No results for input(s): CHOL, HDL, LDLCALC, TRIG, CHOLHDL, LDLDIRECT in the last 72 hours. Thyroid Function Tests: No results for input(s): TSH, T4TOTAL, FREET4, T3FREE, THYROIDAB in the last 72 hours. Anemia Panel: Recent Labs    11/13/18 1036  FERRITIN 143  TIBC 319  IRON 128   Sepsis Labs: No results for input(s): PROCALCITON, LATICACIDVEN in the last 168 hours.  Recent Results (from the past 240 hour(s))  Urine culture     Status: Abnormal   Collection Time: 11/12/18 12:47 PM  Result Value Ref Range Status   Specimen Description   Final    URINE, CLEAN CATCH Performed at [facility], [place] [address], [place], [place] [address]    Special Requests   Final    NONE Performed at [facility], [place] [address], [place], [place]  [address]    Culture MULTIPLE SPECIES PRESENT, SUGGEST RECOLLECTION (A)  Final   Report Status 11/13/2018 FINAL  Final  MRSA PCR Screening     Status: None   Collection Time: 11/12/18 10:08 PM  Result Value Ref Range Status   MRSA by PCR NEGATIVE NEGATIVE Final    Comment:        The GeneXpert MRSA Assay (FDA approved for NASAL specimens only), is one component of a comprehensive MRSA colonization surveillance program. It is not intended to diagnose MRSA infection nor to guide or monitor treatment for MRSA infections. Performed at [facility], [place] [address]  [place], [place] [address]        Radiology Studies: No results found.    Scheduled Meds: . heparin  5,000 Units Subcutaneous Q8H   Continuous Infusions: . sodium chloride    . dextrose 5 % and 0.45% NaCl    . insulin    . potassium chloride       LOS: 2 days    Time spent: 25 minutes   Dessa Phi, DO [facility] [email or web address] 11/14/2018, 11:32 AM

## 2018-11-14 NOTE — Progress Notes (Signed)
Received patient from icu at [place] no complaints noted

## 2018-11-15 ENCOUNTER — Ambulatory Visit: Payer: Medicaid Other | Admitting: Pharmacist

## 2018-11-15 LAB — GLUCOSE, CAPILLARY
Glucose-Capillary: 102 mg/dL — ABNORMAL HIGH (ref 70–99)
Glucose-Capillary: 136 mg/dL — ABNORMAL HIGH (ref 70–99)
Glucose-Capillary: 147 mg/dL — ABNORMAL HIGH (ref 70–99)
Glucose-Capillary: 158 mg/dL — ABNORMAL HIGH (ref 70–99)
Glucose-Capillary: 202 mg/dL — ABNORMAL HIGH (ref 70–99)
Glucose-Capillary: 70 mg/dL (ref 70–99)
Glucose-Capillary: 92 mg/dL (ref 70–99)
Glucose-Capillary: 93 mg/dL (ref 70–99)

## 2018-11-15 LAB — BASIC METABOLIC PANEL
Anion gap: 10 (ref 5–15)
Anion gap: 14 (ref 5–15)
Anion gap: 10 (ref 5–15)
BUN: 9 mg/dL (ref 6–20)
BUN: 10 mg/dL (ref 6–20)
BUN: 10 mg/dL (ref 6–20)
CO2: 25 mmol/L (ref 22–32)
CO2: 21 mmol/L — ABNORMAL LOW (ref 22–32)
CO2: 25 mmol/L (ref 22–32)
Calcium: 8.8 mg/dL — ABNORMAL LOW (ref 8.9–10.3)
Calcium: 8.8 mg/dL — ABNORMAL LOW (ref 8.9–10.3)
Calcium: 8.7 mg/dL — ABNORMAL LOW (ref 8.9–10.3)
Chloride: 102 mmol/L (ref 98–111)
Chloride: 100 mmol/L (ref 98–111)
Chloride: 104 mmol/L (ref 98–111)
Creatinine, Ser: 0.7 mg/dL (ref 0.44–1.00)
Creatinine, Ser: 0.78 mg/dL (ref 0.44–1.00)
Creatinine, Ser: 0.79 mg/dL (ref 0.44–1.00)
GFR calc Af Amer: >60 mL/min (ref >60)
GFR calc Af Amer: >60 mL/min (ref >60)
GFR calc Af Amer: >60 mL/min (ref >60)
GFR calc non Af Amer: >60 mL/min (ref >60)
GFR calc non Af Amer: >60 mL/min (ref >60)
GFR calc non Af Amer: >60 mL/min (ref >60)
Glucose, Bld: 109 mg/dL — ABNORMAL HIGH (ref 70–99)
Glucose, Bld: 192 mg/dL — ABNORMAL HIGH (ref 70–99)
Glucose, Bld: 84 mg/dL (ref 70–99)
Potassium: 2.9 mmol/L — ABNORMAL LOW (ref 3.5–5.1)
Potassium: 3 mmol/L — ABNORMAL LOW (ref 3.5–5.1)
Potassium: 3.3 mmol/L — ABNORMAL LOW (ref 3.5–5.1)
Sodium: 137 mmol/L (ref 135–145)
Sodium: 135 mmol/L (ref 135–145)
Sodium: 139 mmol/L (ref 135–145)

## 2018-11-15 LAB — CBC
HCT: 39.4 % (ref 36.0–46.0)
Hemoglobin: 13.8 g/dL (ref 12.0–15.0)
MCH: 29.2 pg (ref 26.0–34.0)
MCHC: 35 g/dL (ref 30.0–36.0)
MCV: 83.5 fL (ref 80.0–100.0)
Platelets: 312 10*3/uL (ref 150–400)
RBC: 4.72 MIL/uL (ref 3.87–5.11)
RDW: 14 % (ref 11.5–15.5)
WBC: 9.8 10*3/uL (ref 4.0–10.5)
nRBC: 0 % (ref 0.0–0.2)

## 2018-11-15 MED ORDER — SODIUM CHLORIDE 0.9 % IV SOLN
INTRAVENOUS | Status: DC
  Administered 2018-11-15: 03:00:00 via INTRAVENOUS

## 2018-11-15 MED ORDER — POTASSIUM CHLORIDE IN NACL 40-0.9 MEQ/L-% IV SOLN
Freq: Once | INTRAVENOUS | Status: AC
  Administered 2018-11-15: 75 mL/h via INTRAVENOUS
  Filled 2018-11-15: qty 1000

## 2018-11-15 MED ORDER — MENTHOL 3 MG MT LOZG
1.0000 | LOZENGE | OROMUCOSAL | Status: DC | PRN
  Administered 2018-11-15: 3 mg via ORAL
  Filled 2018-11-15: qty 9

## 2018-11-15 MED ORDER — INSULIN GLARGINE 100 UNIT/ML ~~LOC~~ SOLN
10.0000 [IU] | Freq: Once | SUBCUTANEOUS | Status: AC
  Administered 2018-11-15: 10 [IU] via SUBCUTANEOUS
  Filled 2018-11-15: qty 0.1

## 2018-11-15 MED ORDER — ENOXAPARIN SODIUM 40 MG/0.4ML ~~LOC~~ SOLN: 40.0000 mg | SUBCUTANEOUS | Status: DC

## 2018-11-15 MED ORDER — INSULIN GLARGINE 100 UNIT/ML ~~LOC~~ SOLN
30.0000 [IU] | Freq: Every day | SUBCUTANEOUS | Status: DC
  Administered 2018-11-15 – 2018-11-16 (×2): 30 [IU] via SUBCUTANEOUS
  Filled 2018-11-15 (×2): qty 0.3

## 2018-11-15 MED ORDER — INSULIN ASPART 100 UNIT/ML ~~LOC~~ SOLN
0.0000 [IU] | SUBCUTANEOUS | Status: DC
  Administered 2018-11-15 (×2): 3 [IU] via SUBCUTANEOUS
  Administered 2018-11-15: 2 [IU] via SUBCUTANEOUS

## 2018-11-15 MED ORDER — INSULIN ASPART 100 UNIT/ML ~~LOC~~ SOLN: 0.0000 [IU] | SUBCUTANEOUS | Status: DC

## 2018-11-15 NOTE — Progress Notes (Signed)
PROGRESS NOTE    Sabrina Sutton  HDQ:[id number] DOB: Mar 03, 1996 DOA: 11/12/2018 PCP: Steve Rattler, DO     Brief Narrative:  Sabrina Sutton is a 23 year old African-American female, diagnosed with diabetes mellitus since the age of 89, on subcutaneous Lantus 30 units once daily and subcu NovoLog 10 units before meals.  Patient is noncompliant with insulin therapy.  Patient has been admitted repeatedly for DKA.  Other medical history includes pyelonephritis and hypokalemia.  Patient presents with nausea, vomiting and abdominal pain.  According to the patient, she has not stopped vomiting since yesterday, and has not been able to keep anything down.  On presentation to the hospital, the blood sugar was 853, with CO2 of less than 7.  Sodium of 132, potassium of 6.2, serum creatinine of 1.99 (baseline of 0.67), total protein of 9.5 with albumin of 4.7, ALT of 130 and AST of 50 and alkaline phosphatase of 200.  WBC is 24.  Patient was admitted for DKA and was started on insulin drip.  New events last 24 hours / Subjective: Patient feeling better this morning, has not tried any diet by mouth yet. No nausea, vomiting, labs looking better.   Assessment & Plan:   Active Problems:   DKA (diabetic ketoacidoses) ([place])   DKA, type 1 diabetes -Secondary to noncompliance -Again transitioned off of IV insulin.  Resume Lantus, sliding scale insulin.  Will remain sliding scale every 4 hours until diet well-tolerated  Hypokalemia -Replace, trend  Acute kidney injury -Resolved  Elevated liver enzymes -Improved  Leukocytosis, polycythemia, thrombocytosis -Secondary to volume depletion, improving with IV fluids -Resolved   DVT prophylaxis: Subcutaneous heparin Code Status: Full code Family Communication: Spoke with mother over the phone while in patient's room Disposition Plan: Transfer out of stepdown unit today.  Replace potassium.  Continue Lantus, sliding scale insulin, watch labs  closely.  Hopefully discharge home 4/25 as long as she remains out of DKA, blood sugars well controlled, tolerating diet   Consultants:   None  Procedures:   None  Antimicrobials:  Anti-infectives (From admission, onward)   Start     Dose/Rate Route Frequency Ordered Stop   11/12/18 1330  cefTRIAXone (ROCEPHIN) 1 g in sodium chloride 0.9 % 100 mL IVPB     1 g 200 mL/hr over 30 Minutes Intravenous  Once 11/12/18 1328 11/12/18 1623       Objective: Vitals:   11/15/18 0600 11/15/18 0700 11/15/18 0800 11/15/18 0900  BP: 104/76 (!) 128/92 119/85 119/82  Pulse: 82 74 (!) 104 (!) 103  Resp: 10 16 16  (!) 9  Temp:   98.4 F (36.9 C)   TempSrc:   Oral   SpO2: 100% 100% 100% 100%  Weight:      Height:        Intake/Output Summary (Last 24 hours) at 11/15/2018 0928 Last data filed at 11/15/2018 0536 Gross per 24 hour  Intake 2197.33 ml  Output 400 ml  Net 1797.33 ml   Filed Weights   11/12/18 1639 11/12/18 2107 11/13/18 0500  Weight: 58.5 kg 51.3 kg 52.6 kg    Examination: General exam: Appears calm and comfortable  Respiratory system: Clear to auscultation. Respiratory effort normal. Cardiovascular system: S1 & S2 heard, tachycardic, regular rhythm. No JVD, murmurs, rubs, gallops or clicks. No pedal edema. Gastrointestinal system: Abdomen is nondistended, soft and nontender. No organomegaly or masses felt. Normal bowel sounds heard. Central nervous system: Alert and oriented. No focal neurological deficits. Extremities: Symmetric 5 x 5 power.  Skin: No rashes, lesions or ulcers Psychiatry: Judgement and insight appear normal. Mood & affect appropriate.   Data Reviewed: I have personally reviewed following labs and imaging studies  CBC: Recent Labs  Lab 11/12/18 1218 11/12/18 2318 11/14/18 0039 11/15/18 0500  WBC 24.0* 16.3* 14.0* 9.8  HGB 15.4* 8.7* 13.6 13.8  HCT 48.6* 28.5* 40.2 39.4  MCV 89.5 68.0* 85.4 83.5  PLT 462* 236 310 875   Basic Metabolic Panel:  Recent Labs  Lab 11/12/18 2318 11/13/18 0231  11/13/18 1338 11/13/18 1659 11/13/18 2044 11/14/18 0039 11/14/18 0855 11/14/18 1335 11/14/18 1737 11/14/18 2100 11/15/18 0100 11/15/18 0500  NA 132* 134*   < > 133* 134* 133* 131* 133* 137 137 138 137 135  K 3.8 3.6   < > 3.6 3.5 4.3 3.6 3.4* 3.9 3.5 3.9 2.9* 3.0*  CL 103 102   < > 101 104 102 100 100 100 102 102 102 100   CO2 18* 12*   < > 15* 19* 16* 13* 9* 17* 19* 23 25 21*  GLUCOSE 111* 153*   < > 188* 137* 162* 256* 419* 247* 184* 163* 109* 192*  BUN 36* 23*   < > 13 11 9 9 10 11 9 10 9 10   CREATININE 3.63* 1.15*   < > 0.72 0.71 0.70 0.74 0.86 0.88 0.75 0.78 0.70 0.78  CALCIUM 7.5* 9.0   < > 9.0 9.0 9.2 9.0 8.8* 9.8 8.9 9.0 8.8* 8.8*  MG 2.3 2.3  --  2.2 2.3 2.2 2.0 2.0  --   --   --   --   --   PHOS 4.5 2.0*  --   --   --   --   --   --   --   --   --   --   --    < > = values in this interval not displayed.   GFR: Estimated Creatinine Clearance: 93.1 mL/min (by C-G formula based on SCr of 0.78 mg/dL). Liver Function Tests: Recent Labs  Lab 11/12/18 1619 11/13/18 0231  AST 50* 35  ALT 130* 107*  ALKPHOS 200* 163*  BILITOT 2.3* 1.5*  PROT 9.5* 8.5*  ALBUMIN 4.7 4.5   No results for input(s): LIPASE, AMYLASE in the last 168 hours. No results for input(s): AMMONIA in the last 168 hours. Coagulation Profile: No results for input(s): INR, PROTIME in the last 168 hours. Cardiac Enzymes: No results for input(s): CKTOTAL, CKMB, CKMBINDEX, TROPONINI in the last 168 hours. BNP (last 3 results) No results for input(s): PROBNP in the last 8760 hours. HbA1C: Recent Labs    11/12/18 2318  HGBA1C 5.4   CBG: Recent Labs  Lab 11/14/18 2308 11/15/18 0001 11/15/18 0106 11/15/18 0221 11/15/18 0410  GLUCAP 185* 202* 136* 102* 158*   Lipid Profile: No results for input(s): CHOL, HDL, LDLCALC, TRIG, CHOLHDL, LDLDIRECT in the last 72 hours. Thyroid Function Tests: No results for input(s): TSH, T4TOTAL, FREET4, T3FREE,  THYROIDAB in the last 72 hours. Anemia Panel: Recent Labs    11/13/18 1036  FERRITIN 143  TIBC 319  IRON 128   Sepsis Labs: No results for input(s): PROCALCITON, LATICACIDVEN in the last 168 hours.  Recent Results (from the past 240 hour(s))  Urine culture     Status: Abnormal   Collection Time: 11/12/18 12:47 PM  Result Value Ref Range Status   Specimen Description   Final    URINE, CLEAN CATCH Performed at [facility], [place]  [address] [place], [place] [address]    Special Requests   Final    NONE Performed at [facility], [place] [address], [place], [place] [address]    Culture MULTIPLE SPECIES PRESENT, SUGGEST RECOLLECTION (A)  Final   Report Status 11/13/2018 FINAL  Final  MRSA PCR Screening     Status: None   Collection Time: 11/12/18 10:08 PM  Result Value Ref Range Status   MRSA by PCR NEGATIVE NEGATIVE Final    Comment:        The GeneXpert MRSA Assay (FDA approved for NASAL specimens only), is one component of a comprehensive MRSA colonization surveillance program. It is not intended to diagnose MRSA infection nor to guide or monitor treatment for MRSA infections. Performed at [facility], [place] [address] [place], [place] [address]        Radiology Studies: No results found.    Scheduled Meds: . Chlorhexidine Gluconate Cloth  6 each Topical Daily  . heparin  5,000 Units Subcutaneous Q8H  . insulin aspart  0-15 Units Subcutaneous Q4H  . insulin glargine  30 Units Subcutaneous Daily  . sodium chloride flush  10-40 mL Intracatheter Q12H   Continuous Infusions: . 0.9 % NaCl with KCl 40 mEq / L       LOS: 3 days    Time spent: 25 minutes   Dessa Phi, DO [facility] [email or web address] 11/15/2018, 9:28 AM

## 2018-11-15 NOTE — Progress Notes (Signed)
Inpatient Diabetes Program Recommendations  AACE/ADA: New Consensus Statement on Inpatient Glycemic Control (2015)  Target Ranges:  Prepandial:   less than 140 mg/dL      Peak postprandial:   less than 180 mg/dL (1-2 hours)      Critically ill patients:  140 - 180 mg/dL   Results for ARTA, STUMP (MRN [id number]) as of 11/15/2018 09:09  Ref. Range 11/14/2018 23:08 11/15/2018 00:01 11/15/2018 01:06 11/15/2018 02:21 11/15/2018 04:10  Glucose-Capillary Latest Ref Range: 70 - 99 mg/dL 185 (H) 202 (H) 136 (H) 102 (H) 158 (H)   Results for XAYLA, PUZIO (MRN [id number]) as of 11/15/2018 09:09  Ref. Range 05/03/2018 23:55 08/22/2018 06:14 10/14/2018 02:20 11/12/2018 23:18  Hemoglobin A1C Latest Ref Range: 4.8 - 5.6 % 12.8 (H)  (320 mg/dl) 13.3 (H)  (335 mg/dl) 11.7 (H)  (289 mg/dl) 5.4  (108 mg/dl)   Admit 04/21 with DKA  History:  Home DM Meds: Lantus 30 units Daily       Novolog 10 units TID with meals  Current Orders: Lantus 30 units Daily      Novolog Moderate Correction Scale/ SSI (0-15 units) Q4 hours   PCP: Dr. Lucila Maine with the [facility]--Last seen 04/15/2018    Transitioned off the IV Insulin Drip on 04/22 to Lantus and Novolog.  Patient began having N&V on the AM of 04/23 and Anion Gap elevated to 24.  IV Insulin Drip and DKA orders restarted yesterday at 1pm.    Transitioned off the IV Insulin Drip again this AM.  Lantus 10 units given at 3am and 30 units Lantus administered at 9am today.      Note patient's A1c has dramatically improved since the last few results have been taken.  Patient was counseled by the DM Coordinator back on 08/23/2018 and also on 10/15/2018.     MD- Recommend the following:  Suggest follow up with PCP as her last visit was back in September 2019.  Also suggest patient seek follow up under the care of an Endocrinologist.    --Will follow patient during hospitalization--  Wyn Quaker  RN, MSN, CDE Diabetes Coordinator Inpatient Glycemic Control Team Team Pager: [phone] (8a-5p)

## 2018-11-16 LAB — GLUCOSE, CAPILLARY
Glucose-Capillary: 164 mg/dL — ABNORMAL HIGH (ref 70–99)
Glucose-Capillary: 94 mg/dL (ref 70–99)
Glucose-Capillary: 162 mg/dL — ABNORMAL HIGH (ref 70–99)
Glucose-Capillary: 151 mg/dL — ABNORMAL HIGH (ref 70–99)

## 2018-11-16 LAB — BASIC METABOLIC PANEL
Anion gap: 11 (ref 5–15)
BUN: 9 mg/dL (ref 6–20)
CO2: 22 mmol/L (ref 22–32)
Calcium: 8.4 mg/dL — ABNORMAL LOW (ref 8.9–10.3)
Chloride: 104 mmol/L (ref 98–111)
Creatinine, Ser: 0.68 mg/dL (ref 0.44–1.00)
GFR calc Af Amer: >60 mL/min (ref >60)
GFR calc non Af Amer: >60 mL/min (ref >60)
Glucose, Bld: 95 mg/dL (ref 70–99)
Potassium: 2.8 mmol/L — ABNORMAL LOW (ref 3.5–5.1)
Sodium: 137 mmol/L (ref 135–145)

## 2018-11-16 MED ORDER — METOCLOPRAMIDE HCL 5 MG/ML IJ SOLN
5.0000 mg | Freq: Four times a day (QID) | INTRAMUSCULAR | Status: DC | PRN
  Administered 2018-11-16: 5 mg via INTRAVENOUS
  Filled 2018-11-16: qty 2

## 2018-11-16 MED ORDER — METOCLOPRAMIDE HCL 5 MG PO TABS: 5.0000 mg | ORAL_TABLET | Freq: Three times a day (TID) | ORAL | 0 refills | Status: DC | PRN

## 2018-11-16 MED ORDER — INSULIN ASPART 100 UNIT/ML ~~LOC~~ SOLN
0.0000 [IU] | Freq: Three times a day (TID) | SUBCUTANEOUS | Status: DC
  Administered 2018-11-16 (×2): 2 [IU] via SUBCUTANEOUS

## 2018-11-16 MED ORDER — POTASSIUM CHLORIDE CRYS ER 20 MEQ PO TBCR: 40.0000 meq | EXTENDED_RELEASE_TABLET | Freq: Once | ORAL | Status: DC

## 2018-11-16 MED ORDER — POTASSIUM CHLORIDE CRYS ER 20 MEQ PO TBCR
40.0000 meq | EXTENDED_RELEASE_TABLET | ORAL | Status: AC
  Administered 2018-11-16 (×2): 40 meq via ORAL
  Filled 2018-11-16 (×2): qty 2

## 2018-11-16 MED ORDER — PROMETHAZINE HCL 12.5 MG PO TABS: 12.5000 mg | ORAL_TABLET | Freq: Four times a day (QID) | ORAL | 0 refills | Status: DC | PRN

## 2018-11-16 MED ORDER — ONDANSETRON 4 MG PO TBDP: 4.0000 mg | ORAL_TABLET | Freq: Four times a day (QID) | ORAL | 0 refills | Status: DC | PRN

## 2018-11-16 NOTE — Discharge Summary (Signed)
Physician Discharge Summary  Sabrina Sutton TML:[id number] DOB: [birth year] DOA: 11/12/2018  PCP: Steve Rattler, DO  Admit date: 11/12/2018 Discharge date: 11/16/2018  Admitted From: Home Disposition:  Home  Recommendations for Outpatient Follow-up:  1. Follow up with PCP in 1 week 2. Recheck BMP in 1 week to check potassium level. Hypokalemia replaced prior to discharge home.   Discharge Condition: Stable, improved CODE STATUS: Full  Diet recommendation: Carb modified   Brief/Interim Summary: Sabrina Sutton is a 23 year old African-American female, diagnosed with diabetes mellitus since the age of 75, on subcutaneous Lantus 30 units once daily and subcu NovoLog 10 units before meals. Patient is noncompliant with insulin therapy. Patient has been admitted repeatedly for DKA. Other medical history includes pyelonephritis and hypokalemia. Patient presents with nausea, vomiting and abdominal pain. According to the patient, she has not stopped vomiting since yesterday, and has not been able to keep anything down. On presentation to the hospital, the blood sugar was 853, with CO2 of less than 7. Sodium of 132, potassium of 6.2, serum creatinine of 1.99 (baseline of 0.67), total protein of 9.5 with albumin of 4.7, ALT of 130 and AST of 50 and alkaline phosphatase of 200. WBC is 24.  Patient was admitted for DKA and was started on insulin drip.   She had initial improvement with closed anion gap. She was transitioned off insulin drip and back to subq lantus/novolog. She had worsening nausea, vomiting, acidosis and had to be put back on insulin drip. She has again improved >24 hours in lab and clinical status. She was given zofran, phenergan, and reglan for nausea and vomiting. On day of discharge, she had tolerated lunch and ready for discharge home.   Discharge Diagnoses:  Active Problems:   DKA (diabetic ketoacidoses) ([place])  DKA, type 1 diabetes -Secondary to  noncompliance -Resolved. Resume lantus, novolog   Hypokalemia -Replace, trend  Acute kidney injury -Resolved  Elevated liver enzymes -Improved  Leukocytosis, polycythemia, thrombocytosis -Secondary to volume depletion, improved with IV fluids -Resolved   Discharge Instructions  Discharge Instructions    Call MD for:  difficulty breathing, headache or visual disturbances   Complete by:  As directed    Call MD for:  extreme fatigue   Complete by:  As directed    Call MD for:  persistant dizziness or light-headedness   Complete by:  As directed    Call MD for:  persistant nausea and vomiting   Complete by:  As directed    Call MD for:  severe uncontrolled pain   Complete by:  As directed    Call MD for:  temperature >100.4   Complete by:  As directed    Diet Carb Modified   Complete by:  As directed    Discharge instructions   Complete by:  As directed    You were cared for by a hospitalist during your hospital stay. If you have any questions about your discharge medications or the care you received while you were in the hospital after you are discharged, you can call the unit and ask to speak with the hospitalist on call if the hospitalist that took care of you is not available. Once you are discharged, your primary care physician will handle any further medical issues. Please note that NO REFILLS for any discharge medications will be authorized once you are discharged, as it is imperative that you return to your primary care physician (or establish a relationship with a primary care physician if you do  not have one) for your aftercare needs so that they can reassess your need for medications and monitor your lab values.   Increase activity slowly   Complete by:  As directed      Allergies as of 11/16/2018   No Known Allergies     Medication List    STOP taking these medications   acetaminophen 325 MG tablet Commonly known as:  TYLENOL     TAKE these medications    Accu-Chek FastClix Lancets Misc Check sugar 10 x daily   accu-chek soft touch lancets Use as directed.   acetone (urine) test strip Check ketones per protocol   Alcohol Pads 70 % Pads Use to wipe skin prior to insulin injections twice daily   glucagon 1 MG injection Inject 1mg  IM if unconscious, seizing, or unable to eat to correct low blood sugar   glucose blood test strip Commonly known as:  Accu-Chek Aviva Plus Please check blood sugars 4 times a day   Accu-Chek Aviva Plus test strip Generic drug:  glucose blood USE AS DIRECTED   insulin aspart 100 UNIT/ML FlexPen Commonly known as:  NovoLOG FlexPen Inject 10 Units into the skin 3 (three) times daily before meals.   Insulin Pen Needle 31G X 5 MM Misc Commonly known as:  B-D UF III MINI PEN NEEDLES CHECK BLOOD SUGAR IN THE MORNING BEFORE EATING, BEFORE EACH MEAL, AND AS NEEDED   Insulin Pen Needle 31G X 5 MM Misc Commonly known as:  B-D UF III MINI PEN NEEDLES USE TO CHECK BLOOD SUGAR IN THE MORNING BEFORE EATING, BEFORE EACH MEAL, AND AS NEEDED   Lantus SoloStar 100 UNIT/ML Solostar Pen Generic drug:  Insulin Glargine ADMINISTER 30 UNITS UNDER THE SKIN DAILY BEFORE BREAKFAST What changed:  See the new instructions.   metoCLOPramide 5 MG tablet Commonly known as:  Reglan Take 1 tablet (5 mg total) by mouth every 8 (eight) hours as needed for up to 10 days for nausea or vomiting.   ondansetron 4 MG disintegrating tablet Commonly known as:  ZOFRAN-ODT Take 1 tablet (4 mg total) by mouth every 6 (six) hours as needed for nausea or vomiting.   promethazine 12.5 MG tablet Commonly known as:  PHENERGAN Take 1 tablet (12.5 mg total) by mouth every 6 (six) hours as needed for nausea or vomiting.       No Known Allergies  Consultations:  None   Procedures/Studies: No results found.      Discharge Exam: Vitals:   11/15/18 2057 11/16/18 0554  BP: 113/82 109/76  Pulse: 85 83  Resp: 16 16  Temp: 98.7  F (37.1 C) 99 F (37.2 C)  SpO2: 100% 100%    General: Pt is alert, awake, not in acute distress Cardiovascular: RRR, S1/S2 +, no rubs, no gallops Respiratory: CTA bilaterally, no wheezing, no rhonchi Abdominal: Soft, NT, ND, bowel sounds + Extremities: no edema, no cyanosis    The results of significant diagnostics from this hospitalization (including imaging, microbiology, ancillary and laboratory) are listed below for reference.     Microbiology: Recent Results (from the past 240 hour(s))  Urine culture     Status: Abnormal   Collection Time: 11/12/18 12:47 PM  Result Value Ref Range Status   Specimen Description   Final    URINE, CLEAN CATCH Performed at [facility], [place] [address] [place], [place] [address]    Special Requests   Final    NONE Performed at [facility], [address] [place], [place] [address]    Culture MULTIPLE SPECIES PRESENT, SUGGEST RECOLLECTION (A)  Final   Report Status 11/13/2018 FINAL  Final  MRSA PCR Screening     Status: None   Collection Time: 11/12/18 10:08 PM  Result Value Ref Range Status   MRSA by PCR NEGATIVE NEGATIVE Final    Comment:        The GeneXpert MRSA Assay (FDA approved for NASAL specimens only), is one component of a comprehensive MRSA colonization surveillance program. It is not intended to diagnose MRSA infection nor to guide or monitor treatment for MRSA infections. Performed at [facility], [place] [address], [place], [place] [address]      Labs: BNP (last 3 results) No results for input(s): BNP in the last 8760 hours. Basic Metabolic Panel: Recent Labs  Lab 11/12/18 2318 11/13/18 0231  11/13/18 1338 11/13/18 1659 11/13/18 2044 11/14/18 0039 11/14/18 0855  11/14/18 2100 11/15/18 0100 11/15/18 0500 11/15/18 1738 11/16/18 0345  NA 132* 134*   < > 133* 134* 133* 131* 133*   < > 138 137 135 139 137  K 3.8 3.6   < > 3.6 3.5 4.3 3.6 3.4*   <  > 3.9 2.9* 3.0* 3.3* 2.8*  CL 103 102   < > 101 104 102 100 100   < > 102 102 100 104 104  CO2 18* 12*   < > 15* 19* 16* 13* 9*   < > 23 25 21* 25 22  GLUCOSE 111* 153*   < > 188* 137* 162* 256* 419*   < > 163* 109* 192* 84 95  BUN 36* 23*   < > 13 11 9 9 10    < > 10 9 10 10 9   CREATININE 3.63* 1.15*   < > 0.72 0.71 0.70 0.74 0.86   < > 0.78 0.70 0.78 0.79 0.68  CALCIUM 7.5* 9.0   < > 9.0 9.0 9.2 9.0 8.8*   < > 9.0 8.8* 8.8* 8.7* 8.4*  MG 2.3 2.3  --  2.2 2.3 2.2 2.0 2.0  --   --   --   --   --   --   PHOS 4.5 2.0*  --   --   --   --   --   --   --   --   --   --   --   --    < > = values in this interval not displayed.   Liver Function Tests: Recent Labs  Lab 11/12/18 1619 11/13/18 0231  AST 50* 35  ALT 130* 107*  ALKPHOS 200* 163*  BILITOT 2.3* 1.5*  PROT 9.5* 8.5*  ALBUMIN 4.7 4.5   No results for input(s): LIPASE, AMYLASE in the last 168 hours. No results for input(s): AMMONIA in the last 168 hours. CBC: Recent Labs  Lab 11/12/18 1218 11/12/18 2318 11/14/18 0039 11/15/18 0500  WBC 24.0* 16.3* 14.0* 9.8  HGB 15.4* 8.7* 13.6 13.8  HCT 48.6* 28.5* 40.2 39.4  MCV 89.5 68.0* 85.4 83.5  PLT 462* 236 310 312   Cardiac Enzymes: No results for input(s): CKTOTAL, CKMB, CKMBINDEX, TROPONINI in the last 168 hours. BNP: Invalid input(s): POCBNP CBG: Recent Labs  Lab 11/15/18 2057 11/15/18 2350 11/16/18 0449 11/16/18 0757 11/16/18 1108  GLUCAP 93 92 94 162* 151*   D-Dimer No results for input(s): DDIMER in the last 72 hours. Hgb A1c No results for input(s): HGBA1C in the last 72 hours.  Lipid Profile No results for input(s): CHOL, HDL, LDLCALC, TRIG, CHOLHDL, LDLDIRECT in the last 72 hours. Thyroid function studies No results for input(s): TSH, T4TOTAL, T3FREE, THYROIDAB in the last 72 hours.  Invalid input(s): FREET3 Anemia work up No results for input(s): VITAMINB12, FOLATE, FERRITIN, TIBC, IRON, RETICCTPCT in the last 72 hours. Urinalysis    Component Value  Date/Time   COLORURINE YELLOW 11/12/2018 1247   APPEARANCEUR HAZY (A) 11/12/2018 1247   LABSPEC 1.021 11/12/2018 1247   PHURINE 5.0 11/12/2018 1247   GLUCOSEU >=500 (A) 11/12/2018 1247   HGBUR LARGE (A) 11/12/2018 1247   BILIRUBINUR NEGATIVE 11/12/2018 1247   KETONESUR 80 (A) 11/12/2018 1247   PROTEINUR 30 (A) 11/12/2018 1247   UROBILINOGEN 1.0 08/21/2014 1549   NITRITE NEGATIVE 11/12/2018 1247   LEUKOCYTESUR NEGATIVE 11/12/2018 1247   Sepsis Labs Invalid input(s): PROCALCITONIN,  WBC,  LACTICIDVEN Microbiology Recent Results (from the past 240 hour(s))  Urine culture     Status: Abnormal   Collection Time: 11/12/18 12:47 PM  Result Value Ref Range Status   Specimen Description   Final    URINE, CLEAN CATCH Performed at [facility], [place] [address], [place], [place] [address]    Special Requests   Final    NONE Performed at [facility], [place] [address] [place], [place] [address]    Culture MULTIPLE SPECIES PRESENT, SUGGEST RECOLLECTION (A)  Final   Report Status 11/13/2018 FINAL  Final  MRSA PCR Screening     Status: None   Collection Time: 11/12/18 10:08 PM  Result Value Ref Range Status   MRSA by PCR NEGATIVE NEGATIVE Final    Comment:        The GeneXpert MRSA Assay (FDA approved for NASAL specimens only), is one component of a comprehensive MRSA colonization surveillance program. It is not intended to diagnose MRSA infection nor to guide or monitor treatment for MRSA infections. Performed at [facility], [place] [address] [place], [place] [address]      Patient was seen and examined on the day of discharge and was found to be in stable condition. Time coordinating discharge: 25 minutes including assessment and coordination of care, as well as examination of the patient.   SIGNED:  Dessa Phi, DO [facility] [email or web address] 11/16/2018, 12:54 PM

## 2019-02-07 ENCOUNTER — Ambulatory Visit: Payer: Medicaid Other | Admitting: Family Medicine

## 2019-02-07 NOTE — Progress Notes (Deleted)
  Subjective:   Patient ID: Sabrina Sutton    DOB: [birth year], 23 y.o. 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 Dizziness described as:  *** Feels like room spins: *** Lightheadedness when stands: *** Palpitations or heart racing: *** Prior dizziness: *** Medications tried: *** Patient believes might be causing their pain: ***  Taking blood thinners: ***  Symptoms Hearing Loss: *** Ear Pain or fullness: *** Nausea or vomiting: *** Vision difficulty or double vision: *** Falls: *** Head trauma: *** Weakness in arm or leg: *** Speaking problems: *** Headache: ***  ROS see HPI Smoking Status noted   ***needs pap appt  Review of Systems:  Per HPI.  [place], medications and smoking status reviewed.  Objective:   There were no vitals taken for this visit. Vitals and nursing note reviewed.  General: well nourished, well developed, in no acute distress with non-toxic appearance HEENT: normocephalic, atraumatic, moist mucous membranes Neck: supple, non-tender without lymphadenopathy CV: regular rate and rhythm without murmurs, rubs, or gallops, no lower extremity edema Lungs: clear to auscultation bilaterally with normal work of breathing Abdomen: soft, non-tender, non-distended, no masses or organomegaly palpable, normoactive bowel sounds Skin: warm, dry, no rashes or lesions Extremities: warm and well perfused, normal tone MSK: ROM grossly intact, strength intact, gait normal Neuro: Alert and oriented, speech  normal  Assessment & Plan:   No problem-specific Assessment & Plan notes found for this encounter.  No orders of the defined types were placed in this encounter.  No orders of the defined types were placed in this encounter.   Sabrina Percy, DO PGY-3, [facility] 02/07/2019 12:26 PM

## 2019-02-17 ENCOUNTER — Other Ambulatory Visit: Payer: Self-pay

## 2019-02-17 MED ORDER — BD PEN NEEDLE MINI U/F 31G X 5 MM MISC: 0 refills | Status: DC

## 2019-02-17 MED ORDER — LANTUS SOLOSTAR 100 UNIT/ML ~~LOC~~ SOPN: PEN_INJECTOR | SUBCUTANEOUS | 3 refills | Status: DC

## 2019-02-17 NOTE — Telephone Encounter (Signed)
Patient needs an appointment for her diabetes follow-up and to meet new PCP.

## 2019-02-20 ENCOUNTER — Inpatient Hospital Stay (HOSPITAL_COMMUNITY): Payer: Medicaid Other

## 2019-02-20 ENCOUNTER — Other Ambulatory Visit (HOSPITAL_COMMUNITY): Payer: Medicaid Other

## 2019-02-20 ENCOUNTER — Inpatient Hospital Stay (HOSPITAL_COMMUNITY)
Admission: EM | Admit: 2019-02-20 | Discharge: 2019-02-22 | DRG: 638 | Disposition: A | Discharge status: Home / Self Care | Payer: Medicaid Other | Attending: Family Medicine | Admitting: Family Medicine

## 2019-02-20 ENCOUNTER — Other Ambulatory Visit: Payer: Self-pay

## 2019-02-20 DIAGNOSIS — Z9114 Patient's other noncompliance with medication regimen: Secondary | ICD-10-CM

## 2019-02-20 DIAGNOSIS — E1010 Type 1 diabetes mellitus with ketoacidosis without coma: Principal | ICD-10-CM | POA: Diagnosis present

## 2019-02-20 DIAGNOSIS — E860 Dehydration: Secondary | ICD-10-CM | POA: Diagnosis present

## 2019-02-20 DIAGNOSIS — E1065 Type 1 diabetes mellitus with hyperglycemia: Secondary | ICD-10-CM | POA: Diagnosis not present

## 2019-02-20 DIAGNOSIS — D72829 Elevated white blood cell count, unspecified: Secondary | ICD-10-CM

## 2019-02-20 DIAGNOSIS — Z20828 Contact with and (suspected) exposure to other viral communicable diseases: Secondary | ICD-10-CM | POA: Diagnosis present

## 2019-02-20 DIAGNOSIS — E108 Type 1 diabetes mellitus with unspecified complications: Secondary | ICD-10-CM | POA: Diagnosis not present

## 2019-02-20 DIAGNOSIS — Z794 Long term (current) use of insulin: Secondary | ICD-10-CM | POA: Diagnosis not present

## 2019-02-20 DIAGNOSIS — R1011 Right upper quadrant pain: Secondary | ICD-10-CM

## 2019-02-20 DIAGNOSIS — R112 Nausea with vomiting, unspecified: Secondary | ICD-10-CM

## 2019-02-20 DIAGNOSIS — E871 Hypo-osmolality and hyponatremia: Secondary | ICD-10-CM | POA: Diagnosis not present

## 2019-02-20 DIAGNOSIS — Z833 Family history of diabetes mellitus: Secondary | ICD-10-CM | POA: Diagnosis not present

## 2019-02-20 DIAGNOSIS — N390 Urinary tract infection, site not specified: Secondary | ICD-10-CM | POA: Diagnosis present

## 2019-02-20 DIAGNOSIS — N179 Acute kidney failure, unspecified: Secondary | ICD-10-CM | POA: Diagnosis present

## 2019-02-20 DIAGNOSIS — E1110 Type 2 diabetes mellitus with ketoacidosis without coma: Secondary | ICD-10-CM | POA: Diagnosis present

## 2019-02-20 DIAGNOSIS — K3184 Gastroparesis: Secondary | ICD-10-CM | POA: Diagnosis not present

## 2019-02-20 DIAGNOSIS — N2889 Other specified disorders of kidney and ureter: Secondary | ICD-10-CM

## 2019-02-20 DIAGNOSIS — IMO0002 Reserved for concepts with insufficient information to code with codable children: Secondary | ICD-10-CM

## 2019-02-20 LAB — BASIC METABOLIC PANEL
Anion gap: 25 — ABNORMAL HIGH (ref 5–15)
Anion gap: 18 — ABNORMAL HIGH (ref 5–15)
Anion gap: 9 (ref 5–15)
Anion gap: 14 (ref 5–15)
Anion gap: 10 (ref 5–15)
BUN: 19 mg/dL (ref 6–20)
BUN: 16 mg/dL (ref 6–20)
BUN: 14 mg/dL (ref 6–20)
BUN: 12 mg/dL (ref 6–20)
BUN: 11 mg/dL (ref 6–20)
CO2: 13 mmol/L — ABNORMAL LOW (ref 22–32)
CO2: 18 mmol/L — ABNORMAL LOW (ref 22–32)
CO2: 22 mmol/L (ref 22–32)
CO2: 20 mmol/L — ABNORMAL LOW (ref 22–32)
CO2: 21 mmol/L — ABNORMAL LOW (ref 22–32)
Calcium: 10.4 mg/dL — ABNORMAL HIGH (ref 8.9–10.3)
Calcium: 10 mg/dL (ref 8.9–10.3)
Calcium: 9.1 mg/dL (ref 8.9–10.3)
Calcium: 9.4 mg/dL (ref 8.9–10.3)
Calcium: 9.3 mg/dL (ref 8.9–10.3)
Chloride: 96 mmol/L — ABNORMAL LOW (ref 98–111)
Chloride: 102 mmol/L (ref 98–111)
Chloride: 107 mmol/L (ref 98–111)
Chloride: 105 mmol/L (ref 98–111)
Chloride: 107 mmol/L (ref 98–111)
Creatinine, Ser: 1.51 mg/dL — ABNORMAL HIGH (ref 0.44–1.00)
Creatinine, Ser: 0.92 mg/dL (ref 0.44–1.00)
Creatinine, Ser: 0.82 mg/dL (ref 0.44–1.00)
Creatinine, Ser: 0.75 mg/dL (ref 0.44–1.00)
Creatinine, Ser: 0.68 mg/dL (ref 0.44–1.00)
GFR calc Af Amer: 57 mL/min — ABNORMAL LOW (ref >60)
GFR calc Af Amer: >60 mL/min (ref >60)
GFR calc Af Amer: >60 mL/min (ref >60)
GFR calc Af Amer: >60 mL/min (ref >60)
GFR calc Af Amer: >60 mL/min (ref >60)
GFR calc non Af Amer: 49 mL/min — ABNORMAL LOW (ref >60)
GFR calc non Af Amer: >60 mL/min (ref >60)
GFR calc non Af Amer: >60 mL/min (ref >60)
GFR calc non Af Amer: >60 mL/min (ref >60)
GFR calc non Af Amer: >60 mL/min (ref >60)
Glucose, Bld: 483 mg/dL — ABNORMAL HIGH (ref 70–99)
Glucose, Bld: 255 mg/dL — ABNORMAL HIGH (ref 70–99)
Glucose, Bld: 199 mg/dL — ABNORMAL HIGH (ref 70–99)
Glucose, Bld: 138 mg/dL — ABNORMAL HIGH (ref 70–99)
Glucose, Bld: 128 mg/dL — ABNORMAL HIGH (ref 70–99)
Potassium: 4 mmol/L (ref 3.5–5.1)
Potassium: 3.8 mmol/L (ref 3.5–5.1)
Potassium: 4.7 mmol/L (ref 3.5–5.1)
Potassium: 3.6 mmol/L (ref 3.5–5.1)
Potassium: 3.5 mmol/L (ref 3.5–5.1)
Sodium: 134 mmol/L — ABNORMAL LOW (ref 135–145)
Sodium: 138 mmol/L (ref 135–145)
Sodium: 138 mmol/L (ref 135–145)
Sodium: 139 mmol/L (ref 135–145)
Sodium: 138 mmol/L (ref 135–145)

## 2019-02-20 LAB — URINALYSIS, ROUTINE W REFLEX MICROSCOPIC
Bilirubin Urine: NEGATIVE
Glucose, UA: >=500 mg/dL — AB
Ketones, ur: 80 mg/dL — AB
Nitrite: NEGATIVE
Protein, ur: 30 mg/dL — AB
RBC / HPF: >50 RBC/hpf — ABNORMAL HIGH (ref 0–5)
Specific Gravity, Urine: 1.022 (ref 1.005–1.030)
pH: 5 (ref 5.0–8.0)

## 2019-02-20 LAB — CBC
HCT: 43.4 % (ref 36.0–46.0)
Hemoglobin: 14.6 g/dL (ref 12.0–15.0)
MCH: 29.7 pg (ref 26.0–34.0)
MCHC: 33.6 g/dL (ref 30.0–36.0)
MCV: 88.2 fL (ref 80.0–100.0)
Platelets: 362 10*3/uL (ref 150–400)
RBC: 4.92 MIL/uL (ref 3.87–5.11)
RDW: 13.1 % (ref 11.5–15.5)
WBC: 27.3 10*3/uL — ABNORMAL HIGH (ref 4.0–10.5)
nRBC: 0 % (ref 0.0–0.2)

## 2019-02-20 LAB — GLUCOSE, CAPILLARY
Glucose-Capillary: 127 mg/dL — ABNORMAL HIGH (ref 70–99)
Glucose-Capillary: 135 mg/dL — ABNORMAL HIGH (ref 70–99)
Glucose-Capillary: 112 mg/dL — ABNORMAL HIGH (ref 70–99)

## 2019-02-20 LAB — HEMOGLOBIN A1C
Hgb A1c MFr Bld: 10.6 % — ABNORMAL HIGH (ref 4.8–5.6)
Mean Plasma Glucose: 257.52 mg/dL

## 2019-02-20 LAB — CBG MONITORING, ED
Glucose-Capillary: 301 mg/dL — ABNORMAL HIGH (ref 70–99)
Glucose-Capillary: 240 mg/dL — ABNORMAL HIGH (ref 70–99)
Glucose-Capillary: 252 mg/dL — ABNORMAL HIGH (ref 70–99)
Glucose-Capillary: 262 mg/dL — ABNORMAL HIGH (ref 70–99)
Glucose-Capillary: 200 mg/dL — ABNORMAL HIGH (ref 70–99)
Glucose-Capillary: 224 mg/dL — ABNORMAL HIGH (ref 70–99)
Glucose-Capillary: 226 mg/dL — ABNORMAL HIGH (ref 70–99)
Glucose-Capillary: 189 mg/dL — ABNORMAL HIGH (ref 70–99)
Glucose-Capillary: 168 mg/dL — ABNORMAL HIGH (ref 70–99)
Glucose-Capillary: 126 mg/dL — ABNORMAL HIGH (ref 70–99)
Glucose-Capillary: 113 mg/dL — ABNORMAL HIGH (ref 70–99)

## 2019-02-20 LAB — I-STAT BETA HCG BLOOD, ED (MC, WL, AP ONLY)
I-stat hCG, quantitative: <5 m[IU]/mL (ref <5)
I-stat hCG, quantitative: <5 m[IU]/mL (ref <5)

## 2019-02-20 LAB — LACTIC ACID, PLASMA
Lactic Acid, Venous: 2.6 mmol/L (ref 0.5–1.9)
Lactic Acid, Venous: 3 mmol/L (ref 0.5–1.9)
Lactic Acid, Venous: 1.7 mmol/L (ref 0.5–1.9)

## 2019-02-20 MED ORDER — INSULIN ASPART 100 UNIT/ML ~~LOC~~ SOLN: 0.0000 [IU] | SUBCUTANEOUS | Status: DC

## 2019-02-20 MED ORDER — SODIUM CHLORIDE 0.9 % IV SOLN
1.0000 g | Freq: Once | INTRAVENOUS | Status: AC
  Administered 2019-02-20: 06:00:00 1 g via INTRAVENOUS
  Filled 2019-02-20: qty 10

## 2019-02-20 MED ORDER — INSULIN REGULAR(HUMAN) IN NACL 100-0.9 UT/100ML-% IV SOLN
INTRAVENOUS | Status: DC
  Administered 2019-02-20: 2.4 [IU]/h via INTRAVENOUS
  Filled 2019-02-20: qty 100

## 2019-02-20 MED ORDER — ENOXAPARIN SODIUM 40 MG/0.4ML ~~LOC~~ SOLN
40.0000 mg | SUBCUTANEOUS | Status: DC
  Administered 2019-02-21: 10:00:00 40 mg via SUBCUTANEOUS
  Filled 2019-02-20 (×2): qty 0.4

## 2019-02-20 MED ORDER — INSULIN GLARGINE 100 UNIT/ML ~~LOC~~ SOLN
10.0000 [IU] | Freq: Every day | SUBCUTANEOUS | Status: DC
  Administered 2019-02-20 – 2019-02-22 (×3): 10 [IU] via SUBCUTANEOUS
  Filled 2019-02-20 (×4): qty 0.1

## 2019-02-20 MED ORDER — INSULIN REGULAR(HUMAN) IN NACL 100-0.9 UT/100ML-% IV SOLN: INTRAVENOUS | Status: AC

## 2019-02-20 MED ORDER — DEXTROSE-NACL 5-0.45 % IV SOLN
INTRAVENOUS | Status: DC
  Administered 2019-02-20 (×2): via INTRAVENOUS

## 2019-02-20 MED ORDER — SODIUM CHLORIDE 0.9 % IV BOLUS
1000.0000 mL | Freq: Once | INTRAVENOUS | Status: AC
  Administered 2019-02-20: 1000 mL via INTRAVENOUS

## 2019-02-20 MED ORDER — INSULIN ASPART 100 UNIT/ML ~~LOC~~ SOLN
10.0000 [IU] | Freq: Once | SUBCUTANEOUS | Status: AC
  Administered 2019-02-20: 06:00:00 10 [IU] via INTRAVENOUS

## 2019-02-20 MED ORDER — DEXTROSE-NACL 5-0.45 % IV SOLN: INTRAVENOUS | Status: AC

## 2019-02-20 MED ORDER — PROMETHAZINE HCL 25 MG/ML IJ SOLN
12.5000 mg | Freq: Four times a day (QID) | INTRAMUSCULAR | Status: DC | PRN
  Administered 2019-02-20 – 2019-02-21 (×3): 12.5 mg via INTRAVENOUS
  Filled 2019-02-20 (×3): qty 1

## 2019-02-20 MED ORDER — POTASSIUM CHLORIDE 10 MEQ/100ML IV SOLN
10.0000 meq | INTRAVENOUS | Status: AC
  Administered 2019-02-20 (×2): 10 meq via INTRAVENOUS
  Filled 2019-02-20: qty 100

## 2019-02-20 MED ORDER — POTASSIUM CHLORIDE 10 MEQ/100ML IV SOLN
10.0000 meq | INTRAVENOUS | Status: AC
  Administered 2019-02-20 (×2): 10 meq via INTRAVENOUS
  Filled 2019-02-20 (×2): qty 100

## 2019-02-20 MED ORDER — ONDANSETRON HCL 4 MG/2ML IJ SOLN
4.0000 mg | Freq: Once | INTRAMUSCULAR | Status: AC
  Administered 2019-02-20: 4 mg via INTRAVENOUS
  Filled 2019-02-20: qty 2

## 2019-02-20 MED ORDER — ACETAMINOPHEN 650 MG RE SUPP: 650.0000 mg | Freq: Four times a day (QID) | RECTAL | Status: DC | PRN

## 2019-02-20 MED ORDER — INSULIN ASPART 100 UNIT/ML ~~LOC~~ SOLN: 3.0000 [IU] | Freq: Three times a day (TID) | SUBCUTANEOUS | Status: DC

## 2019-02-20 MED ORDER — INSULIN ASPART 100 UNIT/ML ~~LOC~~ SOLN
0.0000 [IU] | SUBCUTANEOUS | Status: DC
  Administered 2019-02-21: 1 [IU] via SUBCUTANEOUS
  Administered 2019-02-21 (×2): 3 [IU] via SUBCUTANEOUS
  Administered 2019-02-21: 2 [IU] via SUBCUTANEOUS
  Administered 2019-02-22: 3 [IU] via SUBCUTANEOUS
  Administered 2019-02-22: 2 [IU] via SUBCUTANEOUS
  Administered 2019-02-22: 5 [IU] via SUBCUTANEOUS

## 2019-02-20 MED ORDER — ACETAMINOPHEN 325 MG PO TABS: 650.0000 mg | ORAL_TABLET | Freq: Four times a day (QID) | ORAL | Status: DC | PRN

## 2019-02-20 NOTE — ED Notes (Signed)
Pt CBG was 113, notified Patricia(RN)

## 2019-02-20 NOTE — H&P (Addendum)
[facility] Admission History and Physical Service Pager: [phone]  Patient name: Sabrina Sutton Medical record number: [id number] Date of birth: [birth year] Age: 23 y.o. Gender: female  Primary Care Provider: Rory Percy, DO Consultants: none Code Status: full Preferred Emergency Contact: Caprice Kluver, [phone]  Chief Complaint: nausea, vomiting  Assessment and Plan: Sabrina Sutton is a 23 y.o. female presenting with likely diabetic ketoacidosis Secondary to medication noncompliance.  Probable diabetic ketoacidosis/type 1 diabetes 2-day history of nausea, vomiting, back pain.  On presentation CBG 43, anion gap 25.  Started on insulin drip with subsequent improvement in CBG.  No ABG so unclear if in true acidosis.  Regardless we will treat as DKA.  Patient is known type I diabetic and has not been taking her insulin recently, which is likely the trigger for her DKA.  Will redraw BMP, transition off of insulin drip when anion gap closed. -Admit to inpatient family medicine, Dr. Owens Shark, inpatient status -Continue insulin drip, will transition once anion gap closed -Check stat BMP to assess potassium and anion gap -Continue normal saline at 125 mL's per hour, transition to D5 half-normal saline when CBG less than 250 -BMP every 4 hours till gap closed - carb-Modified diet -Lovenox for DVT prophylaxis  Right renal mass/Hematuria CT from 07/2018 showing 3.4 heterogenously enhancing mass seen in right kidney, right is concerning for renal cell carcinoma.  Urology was consulted, who felt that it was more likely to be due infectious etiology rather than carcinoma.  She was placed on ciprofloxacin for 1 month, I did have significant clinical benefit.  Patient states that she did finish this course of antibiotics.  Patient's hematuria could reflect healing kidney tissue.  Could also be secondary to AKI due to dehydration. -If nausea vomiting, lactic  acid, leukocytosis do not improve could consider repeat imaging  AKI Patient with acute kidney injury.  Likely secondary to dehydration.  Baseline appears to be around 0.7.  Daily BMP elevated.  Continue normal saline at 125, changed to D5 half-normal at less than 250.  Pseudohyponatremia NA 134.  When corrected for glucose of 48, sodium 139.  Fluids as above.  Will monitor on subsequent BMPs  Concern for urinary tract infection Patient with urinary frequency, urgency.  Likely due to greater than 500 glucose, and high CBG.  Received 1 dose ceftriaxone.  Will discontinue this medication as her symptoms better fit with her diabetes than infection.  Urine culture pending, can restart if symptomatic and culture meets criteria for UTI. -Stopping ceftriaxone -Follow-up urine culture  Lactic acidosis Likely secondary to DKA profound dehydration.  Will recheck lactic acid, subsiding when below 2.  Receiving normal saline at 125 mL/h.  Status post 1 L bolus.  Received dose of ceftriaxone.  Leukocytosis WBC 27.3.  Appears to be hemoconcentrated all 3 cell lines.  On secondary to dehydration from DKA.  Received 1 dose ceftriaxone.  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   FEN/GI: carb-modified Prophylaxis: lovenox  Disposition:  History of Present Illness:  Sabrina Sutton is a 23 y.o. female presenting with 2-day history of back pain, nausea, vomiting.  Patient states that the back pain started first.  Due to the back pain the patient stopped taking her insulin.  She is a known type I diabetic that takes 30 units glargine daily, 10 units aspart with each meal.  She started having more frequent urination, developed nausea/vomiting, and her back pain  got more severe throughout the next day.  Presented to medical emergency department for further work-up.  Work-up in the ED consisted of a BMP, CBC, beta hCG, UA, microscopic  UA.  Work-up significant for sodium 134, creatinine 1.5, glucose 483, anion gap 25, lactic acid 2.6, WBC 27.3.  UA significant for greater than 500 glucose, large hemoglobin, 80 ketones.  Patient started on insulin drip, given 1 L normal saline bolus, and put on normal saline at 125 mL/h.  Patient was given ceftriaxone 1 g presumably to treat for urinary tract infection.  Review Of Systems: Per HPI with the following additions:   Review of Systems  Constitutional: Negative for chills and fever.  HENT: Negative for ear discharge, ear pain and tinnitus.   Respiratory: Negative for cough and hemoptysis.   Cardiovascular: Negative for chest pain and palpitations.  Gastrointestinal: Positive for abdominal pain, nausea and vomiting. Negative for constipation, diarrhea and heartburn.  Genitourinary: Positive for frequency and urgency. Negative for dysuria.  Musculoskeletal: Negative for myalgias.  Neurological: Negative for dizziness and headaches.    Patient Active Problem List   Diagnosis Date Noted  . DKA (diabetic ketoacidoses) ([place]) 02/20/2019  . Renal mass 08/21/2018  . Near syncope 05/03/2018  . Type 2 diabetes mellitus with hyperglycemia, with long-term current use of insulin ([place])   . Condyloma acuminatum of vulva 10/31/2017  . 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 Frequent No-show for appointment 11/03/2016  . History of pyelonephritis 04/17/2016  . Type 1 diabetes mellitus without complication ([place]) 81/85/6314    Past Medical History: Past Medical History:  Diagnosis Date  . Diabetes mellitus without complication ([place]) 9/70/26   + GAD Ab  . DKA (diabetic ketoacidoses) ([place]) 09/20/2015  . Hypokalemia   . Pyelonephritis 04/17/2016    Past Surgical History: Past Surgical History:  Procedure Laterality Date  . NO PAST SURGERIES      Social History: Social History   Tobacco Use  . Smoking status: Passive Smoke Exposure - Never Smoker  . Smokeless tobacco: Never Used   Substance Use Topics  . Alcohol use: Yes    Comment: Last drank 1 month ago per MD note  . Drug use: Yes    Types: Marijuana    Comment: Last used 1  month ago per MD note   Additional social history:  Please also refer to relevant sections of EMR.  Family History: Family History  Problem Relation Age of Onset  . Diabetes Maternal Grandmother   . Heart disease Maternal Grandmother        Deceased from MI at age 25  . Hypertension Maternal Grandmother   . Hypercholesterolemia Mother   . Seizures Mother   . Kidney Stones Mother   . Hyperlipidemia Mother   . Stroke Maternal Grandfather        Deceased from stroke at age 37  . Hypertension Paternal Grandmother   . Healthy Father     Allergies and Medications: No Known Allergies No current facility-administered medications on file prior to encounter.    Current Outpatient Medications on File Prior to Encounter  Medication Sig Dispense Refill  . ACCU-CHEK AVIVA PLUS test strip USE AS DIRECTED 100 each 11  . ACCU-CHEK FASTCLIX LANCETS MISC Check sugar 10 x daily 304 each 3  . Alcohol Swabs (ALCOHOL PADS) 70 % PADS Use to wipe skin prior to insulin injections twice daily 200 each 6  . glucagon 1 MG injection Inject 1mg  IM if unconscious, seizing, or unable to eat to  correct low blood sugar 2 each 2  . glucose blood (ACCU-CHEK AVIVA PLUS) test strip Please check blood sugars 4 times a day 300 each 12  . insulin aspart (NOVOLOG FLEXPEN) 100 UNIT/ML FlexPen Inject 10 Units into the skin 3 (three) times daily before meals. 15 mL 5  . Insulin Glargine (LANTUS SOLOSTAR) 100 UNIT/ML Solostar Pen ADMINISTER 30 UNITS UNDER THE SKIN DAILY BEFORE BREAKFAST (Patient taking differently: Inject 30 Units into the skin every morning. ) 15 mL 3  . Insulin Pen Needle (B-D UF III MINI PEN NEEDLES) 31G X 5 MM MISC CHECK BLOOD SUGAR IN THE MORNING BEFORE EATING, BEFORE EACH MEAL, AND AS NEEDED 100 each 2  . Insulin Pen Needle (B-D UF III MINI PEN  NEEDLES) 31G X 5 MM MISC USE TO CHECK BLOOD SUGAR IN THE MORNING BEFORE EATING, BEFORE EACH MEAL, AND AS NEEDED 100 each 0  . Lancets (ACCU-CHEK SOFT TOUCH) lancets Use as directed. 100 each 5  . ondansetron (ZOFRAN-ODT) 4 MG disintegrating tablet Take 1 tablet (4 mg total) by mouth every 6 (six) hours as needed for nausea or vomiting. 20 tablet 0  . promethazine (PHENERGAN) 12.5 MG tablet Take 1 tablet (12.5 mg total) by mouth every 6 (six) hours as needed for nausea or vomiting. 30 tablet 0  . acetone, urine, test strip Check ketones per protocol (Patient not taking: Reported on 05/03/2018) 50 each 3  . metoCLOPramide (REGLAN) 5 MG tablet Take 1 tablet (5 mg total) by mouth every 8 (eight) hours as needed for up to 10 days for nausea or vomiting. (Patient not taking: Reported on 02/20/2019) 30 tablet 0    Objective: BP 127/89   Pulse 88   Temp 98.2 F (36.8 C) (Oral)   Resp 16   LMP 02/18/2019 (Exact Date)   SpO2 100%  Exam: General: 23 year old African-American female, no acute distress, resting comfortably Eyes: EOMI, PERRLA. ENTM: Dry mucous membranes, no palpable cervical adenopathy Cardiovascular: Regular rate rhythm, no M/R/G.  Warm and dry Respiratory: Lungs clear to auscultation bilaterally, no sensory muscle use Gastrointestinal: Soft, nontender, distended. MSK: 5 5 strength all extensor and flexor muscles bilateral upper, bilateral lower extremity Derm: Warm and dry Neuro: CN II to XII intact, no focal neurologic deficit Psych: Appropriate, pleasant  Labs and Imaging: CBC BMET  Recent Labs  Lab 02/20/19 0452  WBC 27.3*  HGB 14.6  HCT 43.4  PLT 362   Recent Labs  Lab 02/20/19 0452  NA 134*  K 4.0  CL 96*  CO2 13*  BUN 19  CREATININE 1.51*  GLUCOSE 483*  CALCIUM 10.4Guadalupe Dawn, MD 02/20/2019, 9:14 AM PGY-3, [place]

## 2019-02-20 NOTE — ED Notes (Signed)
Patient transported to [facility] 

## 2019-02-20 NOTE — Progress Notes (Signed)
Inpatient Diabetes Program Recommendations  AACE/ADA: New Consensus Statement on Inpatient Glycemic Control (2015)  Target Ranges:  Prepandial:   less than 140 mg/dL      Peak postprandial:   less than 180 mg/dL (1-2 hours)      Critically ill patients:  140 - 180 mg/dL   Lab Results  Component Value Date   GLUCAP 200 (H) 02/20/2019   HGBA1C 10.6 (H) 02/20/2019    Review of Glycemic Control  Diabetes history: DM Outpatient Diabetes medications: Lantus 30 units daily + Novolog 10 units tid meal coverage Current orders for Inpatient glycemic control: IV insulin  Inpatient Diabetes Program Recommendations:   Admitted in DKA. Will followup with patient if admitted. When patient out of DKA and ready for transition to subcutaneous insulin, please give basal insulin 2 hrs. prior to D/C of insulin drip and cover CBG with Novolog correction @ time of insulin drip discontinued.  Patient was counseled by the DM Coordinator back on 08/23/2018 and also on 10/15/2018.   Thank you, Nani Gasser. Aylen Stradford, RN, MSN, CDE  Diabetes Coordinator Inpatient Glycemic Control Team Team Pager [phone] (8am-5pm) 02/20/2019 11:30 AM

## 2019-02-20 NOTE — Progress Notes (Signed)
Normal FM residency pager is broken.  Please page [phone] with concerns until 7pm.  -Dr. Criss Rosales

## 2019-02-20 NOTE — ED Notes (Signed)
Admitting provider paged for n/v medication

## 2019-02-20 NOTE — ED Provider Notes (Addendum)
Emergency Department Provider Note   I have reviewed the triage vital signs and the nursing notes.   HISTORY  Chief Complaint Hyperglycemia   HPI Sabrina Sutton is a 23 y.o. female with a history of diabetes and DKA, hypokalemia pyelonephritis in the past who presents the emergency department today secondary to hyper glycemia.  Patient states that she sounds good probably thinks has some right-sided back pain yesterday and thus was unable to give herself her insulin.  I clarified with incredulous tone and she stated that when she is like that she cannot give her medicine.  No fevers.  Has had some increased urination.  Nausea today as well.     No other associated or modifying symptoms.    Past Medical History:  Diagnosis Date  . Diabetes mellitus without complication ([place]) 8/33/82   + GAD Ab  . DKA (diabetic ketoacidoses) ([place]) 09/20/2015  . Hypokalemia   . Pyelonephritis 04/17/2016    Patient Active Problem List   Diagnosis Date Noted  . Renal mass 08/21/2018  . Near syncope 05/03/2018  . Type 2 diabetes mellitus with hyperglycemia, with long-term current use of insulin ([place])   . Condyloma acuminatum of vulva 10/31/2017  . 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 Frequent No-show for appointment 11/03/2016  . History of pyelonephritis 04/17/2016  . Type 1 diabetes mellitus without complication ([place]) 50/53/9767    Past Surgical History:  Procedure Laterality Date  . NO PAST SURGERIES      Current Outpatient Rx  . Order #: [id number] Class: Normal  . Order #: [id number] Class: Normal  . Order #: [id number] Class: Normal  . Order #: [id number] Class: Normal  . Order #: [id number] Class: Print  . Order #: [id number] Class: Normal  . Order #: [id number] Class: Normal  . Order #: [id number] Class: Normal  . Order #: [id number] Class: Normal  . Order #: [id number] Class: Normal  . Order #: [id number] Class: Normal  . Order #: [id number] Class: Normal  . Order #: [id number] Class: Normal  .  Order #: [id number] Class: Normal    Allergies Patient has no known allergies.  Family History  Problem Relation Age of Onset  . Diabetes Maternal Grandmother   . Heart disease Maternal Grandmother        Deceased from MI at age 48  . Hypertension Maternal Grandmother   . Hypercholesterolemia Mother   . Seizures Mother   . Kidney Stones Mother   . Hyperlipidemia Mother   . Stroke Maternal Grandfather        Deceased from stroke at age 22  . Hypertension Paternal Grandmother   . Healthy Father     Social History Social History   Tobacco Use  . Smoking status: Passive Smoke Exposure - Never Smoker  . Smokeless tobacco: Never Used  Substance Use Topics  . Alcohol use: Yes    Comment: Last drank 1 month ago per MD note  . Drug use: Yes    Types: Marijuana    Comment: Last used 1  month ago per MD note    Review of Systems  All other systems negative except as documented in the HPI. All pertinent positives and negatives as reviewed in the HPI. ____________________________________________   PHYSICAL EXAM:  VITAL SIGNS: ED Triage Vitals  Enc Vitals Group     BP 02/20/19 0445 127/87     Pulse Rate 02/20/19 0445 (!) 110     Resp 02/20/19 0445 (!) 22     Temp 02/20/19 0445 98.2 F (36.8 C)     Temp Source 02/20/19  0445 Oral     SpO2 02/20/19 0445 99 %    Constitutional: Alert and oriented. Well appearing and in no acute distress. Eyes: Conjunctivae are normal. PERRL. EOMI. Head: Atraumatic. Nose: No congestion/rhinnorhea. Mouth/Throat: Mucous membranes are moist.  Oropharynx non-erythematous. Neck: No stridor.  No meningeal signs.   Cardiovascular: tachycardic rate, regular rhythm. Good peripheral circulation. Grossly normal heart sounds.   Respiratory: tachypneic respiratory effort.  No retractions. Lungs CTAB. Gastrointestinal: Soft and nontender. No distention.  Musculoskeletal: No lower extremity tenderness nor edema. No gross deformities of extremities.  Neurologic:  Normal speech and language. No gross focal neurologic deficits are appreciated.  Skin:  Skin is warm, dry and intact. No rash noted.  ____________________________________________   LABS (all labs ordered are listed, but only abnormal results are displayed)  Labs Reviewed  BASIC METABOLIC PANEL - Abnormal; Notable for the following components:      Result Value   Sodium 134 (*)    Chloride 96 (*)    CO2 13 (*)    Glucose, Bld 483 (*)    Creatinine, Ser 1.51 (*)    Calcium 10.4 (*)    GFR calc non Af Amer 49 (*)    GFR calc Af Amer 57 (*)    Anion gap 25 (*)    All other components within normal limits  CBC - Abnormal; Notable for the following components:   WBC 27.3 (*)    All other components within normal limits  URINALYSIS, ROUTINE W REFLEX MICROSCOPIC - Abnormal; Notable for the following components:   Color, Urine STRAW (*)    APPearance HAZY (*)    Glucose, UA >=500 (*)    Hgb urine dipstick LARGE (*)    Ketones, ur 80 (*)    Protein, ur 30 (*)    Leukocytes,Ua TRACE (*)    RBC / HPF >50 (*)    Bacteria, UA RARE (*)    All other components within normal limits  LACTIC ACID, PLASMA - Abnormal; Notable for the following components:   Lactic Acid, Venous 2.6 (*)    All other components within normal limits  CBG MONITORING, ED - Abnormal; Notable for the following components:   Glucose-Capillary 301 (*)    All other components within normal limits  NOVEL CORONAVIRUS, NAA (HOSPITAL ORDER, SEND-OUT TO REF LAB)  URINE CULTURE  LACTIC ACID, PLASMA  I-STAT BETA HCG BLOOD, ED ([facility], [facility], [facility] ONLY)  I-STAT BETA HCG BLOOD, ED ([facility], [facility], [facility] ONLY)   ____________________________________________  RADIOLOGY  No results found.  ____________________________________________   PROCEDURES  Procedure(s) performed:   .Critical Care Performed by: Merrily Pew, MD Authorized by: Merrily Pew, MD   Critical care provider statement:    Critical care time (minutes):   45   Critical care was time spent personally by me on the following activities:  Discussions with consultants, evaluation of patient's response to treatment, examination of patient, ordering and performing treatments and interventions, ordering and review of laboratory studies, ordering and review of radiographic studies, pulse oximetry, re-evaluation of patient's condition, obtaining history from patient or surrogate and review of old charts   ____________________________________________   INITIAL IMPRESSION / [place] / ED COURSE  Here with DKA likely 2/2 noncompliance. With her UA as above and right sided pain yesterday, will start rocephin pending urine cultures. Will consult family medicine for admission.   No call from [facility] teaching service, will repage.   After second page, waited another half an hour, still no  call. Will repage.   After another 15 minutes, still no return call. I paged Dr. Criss Rosales who stated there was an issue with pagers and he had made secretary aware. He took my number and stated he would have admitting team call for admission.   After another 20 minutes, no call back. Care transferred to Dr. Tamera Punt pending return call.   Pertinent labs & imaging results that were available during my care of the patient were reviewed by me and considered in my medical decision making (see chart for details).  ____________________________________________  FINAL CLINICAL IMPRESSION(S) / ED DIAGNOSES  Final diagnoses:  Diabetic ketoacidosis without coma associated with type 1 diabetes mellitus (HCC)     MEDICATIONS GIVEN DURING THIS VISIT:  Medications  insulin regular, human (MYXREDLIN) 100 units/ 100 mL infusion (2.4 Units/hr Intravenous New Bag/Given 02/20/19 0616)  dextrose 5 %-0.45 % sodium chloride infusion (has no administration in time range)  ondansetron (ZOFRAN) injection 4 mg (4 mg Intravenous Given 02/20/19 0530)  sodium chloride 0.9 % bolus 1,000 mL (0  mLs Intravenous Stopped 02/20/19 0654)  insulin aspart (novoLOG) injection 10 Units (10 Units Intravenous Given 02/20/19 0530)  cefTRIAXone (ROCEPHIN) 1 g in sodium chloride 0.9 % 100 mL IVPB (0 g Intravenous Stopped 02/20/19 0654)     NEW OUTPATIENT MEDICATIONS STARTED DURING THIS VISIT:  New Prescriptions   No medications on file    Note:  This note was prepared with assistance of Dragon voice recognition software. Occasional wrong-word or sound-a-like substitutions may have occurred due to the inherent limitations of voice recognition software.   Levonne Carreras, Corene Cornea, MD 02/20/19 0722    Merrily Pew, MD 02/20/19 [phone]

## 2019-02-20 NOTE — ED Notes (Addendum)
ED TO INPATIENT HANDOFF REPORT  ED Nurse Name and Phone #:  Patty [phone]  S Name/Age/Gender Sabrina Sutton 23 y.o. female Room/Bed: 057C/057C  Code Status   Code Status: Full Code  Home/SNF/Other Home Patient oriented to: self, place, time and situation Is this baseline? Yes   Triage Complete: Triage complete  Chief Complaint sick  Triage Note Pt c/o N/V. States that she does not take her home meds like she's "suppose to". Mother gave 81 units Lantus PTA   Allergies No Known Allergies  Level of Care/Admitting Diagnosis ED Disposition    ED Disposition Condition Comment   Admit  Hospital Area: [place] [[id number]]  Level of Care: Progressive [102]  Covid Evaluation: Confirmed COVID Negative  Diagnosis: DKA (diabetic ketoacidoses) [facility]) [[id number]]  Admitting Physician: Martyn Malay [[id number]]  Attending Physician: Martyn Malay [[id number]]  Estimated length of stay: past midnight tomorrow  Certification:: I certify this patient will need inpatient services for at least 2 midnights  PT Class (Do Not Modify): Inpatient [101]  PT Acc Code (Do Not Modify): Private [1]       B Medical/Surgery History Past Medical History:  Diagnosis Date  . Diabetes mellitus without complication ([place]) 9/93/71   + GAD Ab  . DKA (diabetic ketoacidoses) ([place]) 09/20/2015  . Hypokalemia   . Pyelonephritis 04/17/2016   Past Surgical History:  Procedure Laterality Date  . NO PAST SURGERIES       A IV Location/Drains/Wounds Patient Lines/Drains/Airways Status   Active Line/Drains/Airways    Name:   Placement date:   Placement time:   Site:   Days:   Peripheral IV 02/20/19 Left Antecubital   02/20/19    -    Antecubital   less than 1   Peripheral IV 02/20/19 Left Hand   02/20/19    0524    Hand   less than 1   Midline Single Lumen Midline Right   -    -    -             Intake/Output Last 24 hours  Intake/Output Summary (Last 24 hours) at 02/20/2019  1542 Last data filed at 02/20/2019 1246 Gross per 24 hour  Intake 213.33 ml  Output -  Net 213.33 ml    Labs/Imaging Results for orders placed or performed during the hospital encounter of 02/20/19 (from the past 48 hour(s))  Urinalysis, Routine w reflex microscopic     Status: Abnormal   Collection Time: 02/20/19  4:48 AM  Result Value Ref Range   Color, Urine STRAW (A) YELLOW   APPearance HAZY (A) CLEAR   Specific Gravity, Urine 1.022 1.005 - 1.030   pH 5.0 5.0 - 8.0   Glucose, UA >=500 (A) NEGATIVE mg/dL   Hgb urine dipstick LARGE (A) NEGATIVE   Bilirubin Urine NEGATIVE NEGATIVE   Ketones, ur 80 (A) NEGATIVE mg/dL   Protein, ur 30 (A) NEGATIVE mg/dL   Nitrite NEGATIVE NEGATIVE   Leukocytes,Ua TRACE (A) NEGATIVE   RBC / HPF >50 (H) 0 - 5 RBC/hpf   WBC, UA 21-50 0 - 5 WBC/hpf   Bacteria, UA RARE (A) NONE SEEN   Squamous Epithelial / LPF 0-5 0 - 5   Mucus PRESENT     Comment: Performed at [facility], [address], [place], [place] [address]  Lactic acid, plasma     Status: Abnormal   Collection Time: 02/20/19  4:48 AM  Result Value Ref Range   Lactic  Acid, Venous 2.6 (HH) 0.5 - 1.9 mmol/L    Comment: CRITICAL RESULT CALLED TO, READ BACK BY AND VERIFIED WITH: Ferd [facility] 02/20/19 0539 WAYK Performed at [facility], [place] [address], [place], [place] [address]   Basic metabolic panel     Status: Abnormal   Collection Time: 02/20/19  4:52 AM  Result Value Ref Range   Sodium 134 (L) 135 - 145 mmol/L   Potassium 4.0 3.5 - 5.1 mmol/L   Chloride 96 (L) 98 - 111 mmol/L   CO2 13 (L) 22 - 32 mmol/L   Glucose, Bld 483 (H) 70 - 99 mg/dL   BUN 19 6 - 20 mg/dL   Creatinine, Ser 1.51 (H) 0.44 - 1.00 mg/dL   Calcium 10.4 (H) 8.9 - 10.3 mg/dL   GFR calc non Af Amer 49 (L) >60 mL/min   GFR calc Af Amer 57 (L) >60 mL/min   Anion gap 25 (H) 5 - 15    Comment: Performed at [facility] [address] [place], [place] [address]  CBC     Status: Abnormal    Collection Time: 02/20/19  4:52 AM  Result Value Ref Range   WBC 27.3 (H) 4.0 - 10.5 K/uL   RBC 4.92 3.87 - 5.11 MIL/uL   Hemoglobin 14.6 12.0 - 15.0 g/dL   HCT 43.4 36.0 - 46.0 %   MCV 88.2 80.0 - 100.0 fL   MCH 29.7 26.0 - 34.0 pg   MCHC 33.6 30.0 - 36.0 g/dL   RDW 13.1 11.5 - 15.5 %   Platelets 362 150 - 400 K/uL   nRBC 0.0 0.0 - 0.2 %    Comment: Performed at [facility], [place] [address], [place],  [address]  I-Stat beta hCG blood, ED     Status: None   Collection Time: 02/20/19  5:01 AM  Result Value Ref Range   I-stat hCG, quantitative <5.0 <5 mIU/mL   Comment 3            Comment:   GEST. AGE      CONC.  (mIU/mL)   <=1 WEEK        5 - 50     2 WEEKS       50 - 500     3 WEEKS       100 - 10,000     4 WEEKS     1,000 - 30,000        FEMALE AND NON-PREGNANT FEMALE:     LESS THAN 5 mIU/mL   CBG monitoring, ED     Status: Abnormal   Collection Time: 02/20/19  6:14 AM  Result Value Ref Range   Glucose-Capillary 301 (H) 70 - 99 mg/dL  I-Stat beta hCG blood, ED     Status: None   Collection Time: 02/20/19  6:21 AM  Result Value Ref Range   I-stat hCG, quantitative <5.0 <5 mIU/mL   Comment 3            Comment:   GEST. AGE      CONC.  (mIU/mL)   <=1 WEEK        5 - 50     2 WEEKS       50 - 500     3 WEEKS       100 - 10,000     4 WEEKS     1,000 - 30,000        FEMALE AND  NON-PREGNANT FEMALE:     LESS THAN 5 mIU/mL   CBG monitoring, ED     Status: Abnormal   Collection Time: 02/20/19  7:14 AM  Result Value Ref Range   Glucose-Capillary 240 (H) 70 - 99 mg/dL  CBG monitoring, ED     Status: Abnormal   Collection Time: 02/20/19  8:18 AM  Result Value Ref Range   Glucose-Capillary 252 (H) 70 - 99 mg/dL  CBG monitoring, ED     Status: Abnormal   Collection Time: 02/20/19  9:22 AM  Result Value Ref Range   Glucose-Capillary 262 (H) 70 - 99 mg/dL  Basic metabolic panel     Status: Abnormal   Collection Time: 02/20/19 10:21 AM  Result Value Ref Range    Sodium 138 135 - 145 mmol/L   Potassium 3.8 3.5 - 5.1 mmol/L   Chloride 102 98 - 111 mmol/L   CO2 18 (L) 22 - 32 mmol/L   Glucose, Bld 255 (H) 70 - 99 mg/dL   BUN 16 6 - 20 mg/dL   Creatinine, Ser 0.92 0.44 - 1.00 mg/dL   Calcium 10.0 8.9 - 10.3 mg/dL   GFR calc non Af Amer >60 >60 mL/min   GFR calc Af Amer >60 >60 mL/min   Anion gap 18 (H) 5 - 15    Comment: Performed at [facility], [address] [place], [place] [address]  Hemoglobin A1c     Status: Abnormal   Collection Time: 02/20/19 10:21 AM  Result Value Ref Range   Hgb A1c MFr Bld 10.6 (H) 4.8 - 5.6 %    Comment: (NOTE) Pre diabetes:          5.7%-6.4% Diabetes:              >6.4% Glycemic control for   <7.0% adults with diabetes    Mean Plasma Glucose 257.52 mg/dL    Comment: Performed at [facility] [address], [place], [place] [address]  Lactic acid, plasma     Status: Abnormal   Collection Time: 02/20/19 10:21 AM  Result Value Ref Range   Lactic Acid, Venous 3.0 (HH) 0.5 - 1.9 mmol/L    Comment: CRITICAL RESULT CALLED TO, READ BACK BY AND VERIFIED WITHUnice Cobble [id number] WILDERK Performed at [facility] [address] [place], [place] [address]   CBG monitoring, ED     Status: Abnormal   Collection Time: 02/20/19 10:31 AM  Result Value Ref Range   Glucose-Capillary 200 (H) 70 - 99 mg/dL  CBG monitoring, ED     Status: Abnormal   Collection Time: 02/20/19 11:34 AM  Result Value Ref Range   Glucose-Capillary 226 (H) 70 - 99 mg/dL   Comment 1 Notify RN    Comment 2 Document in Chart   CBG monitoring, ED     Status: Abnormal   Collection Time: 02/20/19 11:43 AM  Result Value Ref Range   Glucose-Capillary 224 (H) 70 - 99 mg/dL  CBG monitoring, ED     Status: Abnormal   Collection Time: 02/20/19 12:52 PM  Result Value Ref Range   Glucose-Capillary 189 (H) 70 - 99 mg/dL  Lactic acid, plasma     Status: None   Collection Time: 02/20/19  1:00 PM  Result Value Ref Range    Lactic Acid, Venous 1.7 0.5 - 1.9 mmol/L    Comment: Performed at [facility], [place] [address] [place],  [address]  CBG  monitoring, ED     Status: Abnormal   Collection Time: 02/20/19  1:56 PM  Result Value Ref Range   Glucose-Capillary 168 (H) 70 - 99 mg/dL  Basic metabolic panel     Status: Abnormal   Collection Time: 02/20/19  1:58 PM  Result Value Ref Range   Sodium 138 135 - 145 mmol/L   Potassium 4.7 3.5 - 5.1 mmol/L   Chloride 107 98 - 111 mmol/L   CO2 22 22 - 32 mmol/L   Glucose, Bld 199 (H) 70 - 99 mg/dL   BUN 14 6 - 20 mg/dL   Creatinine, Ser 0.82 0.44 - 1.00 mg/dL   Calcium 9.1 8.9 - 10.3 mg/dL   GFR calc non Af Amer >60 >60 mL/min   GFR calc Af Amer >60 >60 mL/min   Anion gap 9 5 - 15    Comment: Performed at [facility], [place] [address] [place], [place] [address]  CBG monitoring, ED     Status: Abnormal   Collection Time: 02/20/19  3:03 PM  Result Value Ref Range   Glucose-Capillary 126 (H) 70 - 99 mg/dL   Comment 1 Notify RN    Comment 2 Document in Chart    US Abdomen Limited Ruq  Result Date: 02/20/2019 CLINICAL DATA:  Right upper quadrant pain and nausea. EXAM: ULTRASOUND ABDOMEN LIMITED RIGHT UPPER QUADRANT COMPARISON:  None. FINDINGS: Gallbladder: No gallstones or wall thickening visualized. No sonographic Murphy sign noted by sonographer. Common bile duct: Diameter: 2.4 mm, normal. Liver: No focal lesion identified. Within normal limits in parenchymal echogenicity. Portal vein is patent on color Doppler imaging with normal direction of blood flow towards the liver. Other: None. IMPRESSION: Normal exam. Electronically Signed   By: Lorriane Shire M.D.   On: 02/20/2019 13:41    Pending Labs Unresulted Labs (From admission, onward)    Start     Ordered   02/27/19 0500  Creatinine, serum  (enoxaparin (LOVENOX)    CrCl >/= 30 ml/min)  Weekly,   R    Comments: while on enoxaparin therapy    02/20/19 0949   02/21/19 0500  CBC WITH DIFFERENTIAL   Tomorrow morning,   R     02/20/19 0949   02/20/19 2355  Basic metabolic panel  Now then every 4 hours,   R (with STAT occurrences)     02/20/19 1156   02/20/19 0552  Novel Coronavirus,NAA,(SEND-OUT TO REF LAB - TAT 24-48 hrs); Hosp Order  (Asymptomatic Patients Labs)  Once,   STAT    Question:  Rule Out  Answer:  Yes   02/20/19 0551   02/20/19 0552  Urine Culture  Add-on,   AD     02/20/19 0552          Vitals/Pain Today's Vitals   02/20/19 1200 02/20/19 1216 02/20/19 1300 02/20/19 1500  BP: 127/85  119/79 107/76  Pulse: 73  71 84  Resp: 17  18 18   Temp:      TempSrc:      SpO2: 100%  100% 100%  PainSc:  0-No pain      Isolation Precautions No active isolations  Medications Medications  insulin regular, human (MYXREDLIN) 100 units/ 100 mL infusion (3.7 Units/hr Intravenous Rate/Dose Change 02/20/19 1508)  dextrose 5 %-0.45 % sodium chloride infusion ( Intravenous New Bag/Given 02/20/19 1400)  enoxaparin (LOVENOX) injection 40 mg (0 mg Subcutaneous Hold 02/20/19 1035)  acetaminophen (TYLENOL) tablet 650 mg (has no administration in time range)  Or  acetaminophen (TYLENOL) suppository 650 mg (has no administration in time range)  promethazine (PHENERGAN) injection 12.5 mg (12.5 mg Intravenous Given 02/20/19 1238)  ondansetron (ZOFRAN) injection 4 mg (4 mg Intravenous Given 02/20/19 0530)  sodium chloride 0.9 % bolus 1,000 mL (0 mLs Intravenous Stopped 02/20/19 0654)  insulin aspart (novoLOG) injection 10 Units (10 Units Intravenous Given 02/20/19 0530)  cefTRIAXone (ROCEPHIN) 1 g in sodium chloride 0.9 % 100 mL IVPB (0 g Intravenous Stopped 02/20/19 0654)  potassium chloride 10 mEq in 100 mL IVPB (0 mEq Intravenous Stopped 02/20/19 1246)  ondansetron (ZOFRAN) injection 4 mg (4 mg Intravenous Given 02/20/19 0912)  potassium chloride 10 mEq in 100 mL IVPB (10 mEq Intravenous New Bag/Given 02/20/19 1430)    Mobility walks Low fall risk   Focused  Assessments    R Recommendations: See Admitting Provider Note  Report given to:   Additional Notes:  Current CBG 113, anion gap closed, CO2 normal, lactic normal. Attending physician has been informed.   New plan is to give her Lantus once the admitting MD puts the order in. Then to give her a clear liquid diet and d/c the insulin drip and D51/2 one hour from Lantus admin. Then CBG checks every hour.  Family at bedside.

## 2019-02-20 NOTE — ED Notes (Signed)
Family at bedside. 

## 2019-02-20 NOTE — Progress Notes (Signed)
Went to evaluate patient because her anion gap closed and was going to discuss transition to injection insulin as opposed to the running infusion.  She was still sleeping after vomiting and attempt to eat lunch and been given Phenergan by her RN.  We will hold off until she is more reliably able to take p.o. intake and leave her on drip with IV fluids for now.  Potassium stable  -Dr. Criss Rosales

## 2019-02-20 NOTE — Progress Notes (Signed)
No to describe transition plan from insulin pump to Lantus.  1.  10 units Lantus ordered. 2.  60 minutes after Lantus is given IV insulin and IV fluids(D5 half-normal) should be stopped. 3.  CBG should continue every 1 hour 4.  BMP should continue every 4 hours 5.  Patient currently ordered for sliding scale every 4 hours with 3 units meal coverage.  Once more reliably eating these doses can be titrated and her short acting sliding scale can be moved to q. Meal.  -Dr. Criss Rosales

## 2019-02-20 NOTE — Progress Notes (Addendum)
FAMILY MEDICINE TEACHING SERVICE Patient - Please contact intern pager [phone] or check [facility] residency on via website [email or web address] (login: [email or web address]) for questions regarding care. Text pages welcome. DO NOT page listed attending provider unless there is no answer from the number above.  

## 2019-02-20 NOTE — ED Notes (Signed)
Admitting provider at bedside.

## 2019-02-20 NOTE — ED Triage Notes (Signed)
Pt c/o N/V. States that she does not take her home meds like she's "suppose to". Mother gave 43 units Lantus PTA

## 2019-02-21 ENCOUNTER — Inpatient Hospital Stay (HOSPITAL_COMMUNITY): Payer: Medicaid Other

## 2019-02-21 ENCOUNTER — Encounter (HOSPITAL_COMMUNITY): Payer: Self-pay | Admitting: General Practice

## 2019-02-21 DIAGNOSIS — E1065 Type 1 diabetes mellitus with hyperglycemia: Secondary | ICD-10-CM

## 2019-02-21 DIAGNOSIS — R112 Nausea with vomiting, unspecified: Secondary | ICD-10-CM

## 2019-02-21 LAB — BASIC METABOLIC PANEL
Anion gap: 13 (ref 5–15)
Anion gap: 14 (ref 5–15)
Anion gap: 14 (ref 5–15)
Anion gap: 15 (ref 5–15)
BUN: 10 mg/dL (ref 6–20)
BUN: 10 mg/dL (ref 6–20)
BUN: 11 mg/dL (ref 6–20)
BUN: 9 mg/dL (ref 6–20)
CO2: 20 mmol/L — ABNORMAL LOW (ref 22–32)
CO2: 21 mmol/L — ABNORMAL LOW (ref 22–32)
CO2: 21 mmol/L — ABNORMAL LOW (ref 22–32)
CO2: 22 mmol/L (ref 22–32)
Calcium: 9.1 mg/dL (ref 8.9–10.3)
Calcium: 9.3 mg/dL (ref 8.9–10.3)
Calcium: 9.4 mg/dL (ref 8.9–10.3)
Calcium: 9.6 mg/dL (ref 8.9–10.3)
Chloride: 100 mmol/L (ref 98–111)
Chloride: 100 mmol/L (ref 98–111)
Chloride: 102 mmol/L (ref 98–111)
Chloride: 105 mmol/L (ref 98–111)
Creatinine, Ser: 0.69 mg/dL (ref 0.44–1.00)
Creatinine, Ser: 0.72 mg/dL (ref 0.44–1.00)
Creatinine, Ser: 0.74 mg/dL (ref 0.44–1.00)
Creatinine, Ser: 0.75 mg/dL (ref 0.44–1.00)
GFR calc Af Amer: >60 mL/min (ref >60)
GFR calc Af Amer: >60 mL/min (ref >60)
GFR calc Af Amer: >60 mL/min (ref >60)
GFR calc Af Amer: >60 mL/min (ref >60)
GFR calc non Af Amer: >60 mL/min (ref >60)
GFR calc non Af Amer: >60 mL/min (ref >60)
GFR calc non Af Amer: >60 mL/min (ref >60)
GFR calc non Af Amer: >60 mL/min (ref >60)
Glucose, Bld: 163 mg/dL — ABNORMAL HIGH (ref 70–99)
Glucose, Bld: 168 mg/dL — ABNORMAL HIGH (ref 70–99)
Glucose, Bld: 185 mg/dL — ABNORMAL HIGH (ref 70–99)
Glucose, Bld: 202 mg/dL — ABNORMAL HIGH (ref 70–99)
Potassium: 3.1 mmol/L — ABNORMAL LOW (ref 3.5–5.1)
Potassium: 3.4 mmol/L — ABNORMAL LOW (ref 3.5–5.1)
Potassium: 3.6 mmol/L (ref 3.5–5.1)
Potassium: 3.9 mmol/L (ref 3.5–5.1)
Sodium: 136 mmol/L (ref 135–145)
Sodium: 136 mmol/L (ref 135–145)
Sodium: 136 mmol/L (ref 135–145)
Sodium: 139 mmol/L (ref 135–145)

## 2019-02-21 LAB — CBC WITH DIFFERENTIAL/PLATELET
Abs Immature Granulocytes: 0.2 10*3/uL — ABNORMAL HIGH (ref 0.00–0.07)
Basophils Absolute: 0.1 10*3/uL (ref 0.0–0.1)
Basophils Relative: 0 %
Eosinophils Absolute: 0 10*3/uL (ref 0.0–0.5)
Eosinophils Relative: 0 %
HCT: 36.9 % (ref 36.0–46.0)
Hemoglobin: 12.7 g/dL (ref 12.0–15.0)
Immature Granulocytes: 1 %
Lymphocytes Relative: 9 %
Lymphs Abs: 2.2 10*3/uL (ref 0.7–4.0)
MCH: 29.5 pg (ref 26.0–34.0)
MCHC: 34.4 g/dL (ref 30.0–36.0)
MCV: 85.8 fL (ref 80.0–100.0)
Monocytes Absolute: 1.6 10*3/uL — ABNORMAL HIGH (ref 0.1–1.0)
Monocytes Relative: 7 %
Neutro Abs: 20.5 10*3/uL — ABNORMAL HIGH (ref 1.7–7.7)
Neutrophils Relative %: 83 %
Platelets: 319 10*3/uL (ref 150–400)
RBC: 4.3 MIL/uL (ref 3.87–5.11)
RDW: 13.2 % (ref 11.5–15.5)
WBC: 24.6 10*3/uL — ABNORMAL HIGH (ref 4.0–10.5)
nRBC: 0 % (ref 0.0–0.2)

## 2019-02-21 LAB — GLUCOSE, CAPILLARY
Glucose-Capillary: 142 mg/dL — ABNORMAL HIGH (ref 70–99)
Glucose-Capillary: 205 mg/dL — ABNORMAL HIGH (ref 70–99)
Glucose-Capillary: 206 mg/dL — ABNORMAL HIGH (ref 70–99)
Glucose-Capillary: 177 mg/dL — ABNORMAL HIGH (ref 70–99)
Glucose-Capillary: 134 mg/dL — ABNORMAL HIGH (ref 70–99)
Glucose-Capillary: 183 mg/dL — ABNORMAL HIGH (ref 70–99)

## 2019-02-21 MED ORDER — ONDANSETRON HCL 4 MG PO TABS: 4.0000 mg | ORAL_TABLET | Freq: Three times a day (TID) | ORAL | Status: DC | PRN

## 2019-02-21 MED ORDER — METOCLOPRAMIDE HCL 5 MG PO TABS
5.0000 mg | ORAL_TABLET | Freq: Three times a day (TID) | ORAL | Status: DC
  Administered 2019-02-21 – 2019-02-22 (×4): 5 mg via ORAL
  Filled 2019-02-21 (×4): qty 1

## 2019-02-21 MED ORDER — SODIUM CHLORIDE 0.9 % IV SOLN
INTRAVENOUS | Status: AC
  Administered 2019-02-21: 12:00:00 via INTRAVENOUS

## 2019-02-21 MED ORDER — POTASSIUM CHLORIDE CRYS ER 20 MEQ PO TBCR
40.0000 meq | EXTENDED_RELEASE_TABLET | ORAL | Status: AC
  Administered 2019-02-21 (×2): 40 meq via ORAL
  Filled 2019-02-21 (×2): qty 2

## 2019-02-21 MED ORDER — METOCLOPRAMIDE HCL 5 MG PO TABS: 5.0000 mg | ORAL_TABLET | Freq: Once | ORAL | Status: DC

## 2019-02-21 NOTE — Progress Notes (Signed)
Family Medicine Teaching Service Daily Progress Note Intern Pager: [phone]  Patient name: Sabrina Sutton Medical record number: [id number] Date of birth: 03-Jul-1996 Age: 23 y.o. Gender: female  Primary Care Provider: Rory Percy, DO Consultants:  Code Status: FULL   Pt Overview and Major Events to Date:  Sabrina Sutton is a 23 y.o. female presenting with likely diabetic ketoacidosis secondary to medication noncompliance.  Assessment and Plan:   Probable diabetic ketoacidosis/type 1 diabetes 2/2 nonadherence to medications Home meds: Lantus 30 units, 10 units 3 times daily with meals 2-day history of nausea, vomiting and back pain. UA: gluc and ketones    On admission CBG 483, anion gap 25. Started on insulin drip, CBGs 142, 112, 135, 127 and anion gap: 13, 15 on 7/30  Now on subcu insulin CBG 205 today Anion gap 13 K 3.1 -10 units Lantus, 3 units NovoLog 3 times daily with meals -Continue IV fluids, normal saline  -BMP every 4 hours till gap closed  AKI likely 2/2 dehydration  Cr 0.74 today, 1.5 on admission. Baseline around 0.7.   -Continue normal saline -Continue BMP daily  Pseudohyponatremia NA 134 on admission. When corrected for glucose of 48, sodium 139.   Na 136 today, corrected Na 138 -Continue to monitor Na  Lactic acidosis Likely 2/2 to DKA profound dehydration.  Will recheck LA subsiding when below 2. -Recheck LA  Leukocytosis WBC 27.3.  Appears to be hemoconcentrated all 3 cell lines.  On secondary to dehydration from DKA. Received 1 dose ceftriaxone -Continue to monitor  Previous right renal mass-resolved Bilateral kidneys are within normal limits. Prior resolving right renal abscess is no longer visualized. No hydronephrosis.  Concern for urinary tract infection Pt had urinary frequency, urgency admission likely 2/2 to high CBG and 500 of glucose and UA, Received 1 dose ceftriaxone but discontinued as patient's symptoms improved Urine  culture pending, can restart if symptomatic and culture meets criteria for UTI. -Monitor symptoms -Follow-up urine culture  PMH Condyloma acuminatum HSV-2 infection Renal cyst (seen on 07/2018 CT scan).   FEN/GI: carb-modified Prophylaxis: lovenox  Disposition: Home  Subjective:  Patient has had 3 episodes of vomiting this morning and still feels sick.  Denies blood in vomit. retching whilst I am with her at bedside.  Has requested an antiemetic.  Denies abdominal pain which is improved since admission.  Denies polyuria, polydipsia or fevers, chest pain and shortness of breath.                Objective: Temp:  [99 F (37.2 C)-99.2 F (37.3 C)] 99 F (37.2 C) (07/31 0441) Pulse Rate:  [71-100] 82 (07/31 0441) Resp:  [13-24] 16 (07/31 0441) BP: (107-128)/(68-91) 110/75 (07/31 0441) SpO2:  [95 %-100 %] 100 % (07/31 0441) Weight:  [54.8 kg] 54.8 kg (07/31 0441)  General: Alert and cooperative, retching, looks uncomfortable HEENT: Neck non-tender without lymphadenopathy, masses or thyromegaly Cardio: Normal S1 and S2, no S3 or S4. Rhythm is regular.. No murmurs or rubs.   Pulm: Clear to auscultation bilaterally, no crackles, wheezing, or diminished breath sounds. Normal respiratory effort Abdomen: Bowel sounds normal. Abdomen soft, mild generalized tenderness.  No guarding Extremities: No peripheral edema. Warm/ well perfused.  Strong radial pulse. Neuro: Cranial nerves grossly intact  Laboratory: Recent Labs  Lab 02/20/19 0452 02/21/19 0351  WBC 27.3* 24.6*  HGB 14.6 12.7  HCT 43.4 36.9  PLT 362 319   Recent Labs  Lab 02/20/19 2111 02/20/19 2359 02/21/19 0351  NA 138 139 136  K 3.5 3.6 3.4*  CL 107 105 102  CO2 21* 20* 21*  BUN 11 11 10   CREATININE 0.68 0.72 0.75  CALCIUM 9.3 9.4 9.1  GLUCOSE 128* 163* 202*      Imaging/Diagnostic Tests: US Renal  Result Date: 02/21/2019 CLINICAL DATA:  Right renal mass on prior CT, favoring resolving renal abscess EXAM:  RENAL / URINARY TRACT ULTRASOUND COMPLETE COMPARISON:  CT abdomen/pelvis dated 09/23/2018 and 08/21/2018 FINDINGS: Right Kidney: Renal measurements: 11.4 x 4.6 x 5.2 cm = volume: 140 mL . Echogenicity within normal limits. No mass or hydronephrosis visualized. Left Kidney: Renal measurements: 11.0 x 4.6 x 4.3 cm = volume: 113 mL. Echogenicity within normal limits. No mass or hydronephrosis visualized. Bladder: Mildly thick-walled although underdistended. IMPRESSION: Bilateral kidneys are within normal limits. Prior resolving right renal abscess is no longer visualized. No hydronephrosis. Electronically Signed   By: Julian Hy M.D.   On: 02/21/2019 03:31   US Abdomen Limited Ruq  Result Date: 02/20/2019 CLINICAL DATA:  Right upper quadrant pain and nausea. EXAM: ULTRASOUND ABDOMEN LIMITED RIGHT UPPER QUADRANT COMPARISON:  None. FINDINGS: Gallbladder: No gallstones or wall thickening visualized. No sonographic Murphy sign noted by sonographer. Common bile duct: Diameter: 2.4 mm, normal. Liver: No focal lesion identified. Within normal limits in parenchymal echogenicity. Portal vein is patent on color Doppler imaging with normal direction of blood flow towards the liver. Other: None. IMPRESSION: Normal exam. Electronically Signed   By: Lorriane Shire M.D.   On: 02/20/2019 13:41    Lattie Haw, MD 02/21/2019, 6:58 AM PGY-1, [place] Intern pager: [phone], text pages welcome

## 2019-02-21 NOTE — Care Management (Signed)
1156 02-21-19 CM received referral for Medication assistance. Patient has Medicaid [place] Access- prescriptions should be no more than $3.00. No further needs from CM at this time. Bethena Roys, RN,BSN Case Manager [phone]

## 2019-02-21 NOTE — Discharge Summary (Signed)
[facility] Discharge Summary  Patient name: Sabrina Sutton Medical record number: [id number] Date of birth: 12/03/1995 Age: 23 y.o. Gender: female Date of Admission: 02/20/2019  Date of Discharge: 02/22/19 Admitting Physician: Martyn Malay, MD  Primary Care Provider: Rory Percy, DO Consultants: none  Indication for Hospitalization: DKA  Discharge Diagnoses/Problem List:  DM I, poorly controlled  Intractable nausea vomiting   Disposition: home  Discharge Condition: stable  Discharge Exam:  General: NAD, non-toxic, well-appearing, sitting comfortably in bed, smiling    HEENT: [place]/AT. PERRLA. EOMI.  Cardiovascular: RRR, normal S1, S2. B/L 2+ RP. No BLEE Respiratory: CTAB. No IWOB.  Abdomen: + BS. NT, ND, soft to palpation.  Extremities: Warm and well perfused. Moving spontaneously.  Integumentary: No obvious rashes, lesions, trauma on general exam. Neuro: A & O x4. CN grossly intact. No FND   Brief Hospital Course:  Patient was admitted for DKA likely secondary to poor adherence to insulin. Pt has been admitted multiple times before for DKA. Blood sugar 483, AG 25, UA ketonuria urine glucose 500. Pt was started on insulin drip with subsquent improvement in CBG 100-200. Pt switched to 10 units lantus and 3 units novolog tid meals with sliding scale. Patient's nausea and vomiting was difficult to control. Differential was chronic gastroparesis diabetes induced, menses, vs. cyclic vomiting. Pt was treated with zofran, but ultimately responded best to Reglan.  She was discharged with Reglan 3 times daily before meals.  She was not having any nausea vomiting at the time of discharge.  Patient was ultimately discharged home with 15 units of Lantus daily and 3 to 5 units of NovoLog 3 times daily with meals.  Patient is to take 5 units of NovoLog if blood sugars greater than 203 units if from 100-200.  She was instructed not to take insulin if her blood sugar  was under 100.  If Patient also had a AKI on admission which normalized with IV hydration. Additionally, patient had a right renal mass seen in January 2020.  Repeat renal ultrasound during admission shows no mass   Issues for Follow Up:  1. Please ensure patient has regular diabetic medication and glucose measurement supplies 2. Please ensure patient regular diabetic follow up and medication compliance 3. Patient expressed interest in conceiving , please educate patient on strict management of her diabetes given her increase risk of pregnancy complications.  She was provided prenatal vitamins prior to discharge.  Significant Procedures: None  Significant Labs and Imaging:  Recent Labs  Lab 02/20/19 0452 02/21/19 0351 02/22/19 0406  WBC 27.3* 24.6* 14.9*  HGB 14.6 12.7 12.8  HCT 43.4 36.9 37.6  PLT 362 319 317   Recent Labs  Lab 02/20/19 2359 02/21/19 0351 02/21/19 0732 02/21/19 1149 02/22/19 0406  NA 139 136 136 136 134*  K 3.6 3.4* 3.1* 3.9 3.6  CL 105 102 100 100 102  CO2 20* 21* 22 21* 22  GLUCOSE 163* 202* 185* 168* 228*  BUN '11 10 10 9 ' 5*  CREATININE 0.72 0.75 0.74 0.69 0.70  CALCIUM 9.4 9.1 9.6 9.3 8.9   COVID negative  Results/Tests Pending at Time of Discharge: None  Discharge Medications:  Allergies as of 02/22/2019   No Known Allergies     Medication List    STOP taking these medications   acetone (urine) test strip     TAKE these medications   Accu-Chek Aviva Plus test strip Generic drug: glucose blood USE AS DIRECTED   Accu-Chek Aviva  Plus test strip Generic drug: glucose blood Please check blood sugars 4 times a day   accu-chek soft touch lancets Use as directed.   Accu-Chek FastClix Lancets Misc Check sugar 10 x daily   Alcohol Pads 70 % Pads Use to wipe skin prior to insulin injections twice daily   B-D UF III MINI PEN NEEDLES 31G X 5 MM Misc Generic drug: Insulin Pen Needle USE TO CHECK BLOOD SUGAR IN THE MORNING BEFORE EATING,  BEFORE EACH MEAL, AND AS NEEDED   B-D UF III MINI PEN NEEDLES 31G X 5 MM Misc Generic drug: Insulin Pen Needle CHECK BLOOD SUGAR IN THE MORNING BEFORE EATING, BEFORE EACH MEAL, AND AS NEEDED   blood glucose meter kit and supplies Dispense based on patient and insurance preference. Use up to four times daily as directed. (FOR ICD-10 E10.9, E11.9).   glucagon 1 MG injection Inject 27m IM if unconscious, seizing, or unable to eat to correct low blood sugar   insulin aspart 100 UNIT/ML FlexPen Commonly known as: NovoLOG FlexPen Inject 10 Units into the skin 3 (three) times daily before meals. What changed: Another medication with the same name was added. Make sure you understand how and when to take each.   insulin aspart 100 UNIT/ML FlexPen Commonly known as: NOVOLOG Inject 3-5 Units into the skin 3 (three) times daily with meals. What changed: You were already taking a medication with the same name, and this prescription was added. Make sure you understand how and when to take each.   Lantus SoloStar 100 UNIT/ML Solostar Pen Generic drug: Insulin Glargine ADMINISTER 30 UNITS UNDER THE SKIN DAILY BEFORE BREAKFAST What changed:   how much to take  how to take this  when to take this  additional instructions   Insulin Glargine 100 UNIT/ML Solostar Pen Commonly known as: LANTUS Inject 15 Units into the skin daily. What changed: You were already taking a medication with the same name, and this prescription was added. Make sure you understand how and when to take each.   metoCLOPramide 5 MG tablet Commonly known as: REGLAN Take 1 tablet (5 mg total) by mouth 4 (four) times daily -  before meals and at bedtime. What changed:   when to take this  reasons to take this   ondansetron 4 MG disintegrating tablet Commonly known as: ZOFRAN-ODT Take 1 tablet (4 mg total) by mouth every 6 (six) hours as needed for nausea or vomiting.   Prenatal Vitamins 28-0.8 MG Tabs Take 1 tablet  by mouth daily.   promethazine 12.5 MG tablet Commonly known as: PHENERGAN Take 1 tablet (12.5 mg total) by mouth every 6 (six) hours as needed for nausea or vomiting.       Discharge Instructions: Please refer to Patient Instructions section of EMR for full details.  Patient was counseled important signs and symptoms that should prompt return to medical care, changes in medications, dietary instructions, activity restrictions, and follow up appointments.   Follow-Up Appointments: Future Appointments  Date Time Provider DFloyd 02/27/2019  2:30 PM RRory Percy [facility]      KWilber Oliphant MD 02/22/2019, 5:30 PM PGY-2 Dr., CMadison

## 2019-02-21 NOTE — Care Management (Addendum)
1215 02-21-19 CM received a call from [place] case Freight forwarder for [employer]. Patients mother had called outpatient Case Manager to make her aware that the patient was out of all diabetes medications- Lantus, Strips, and pen needles-pt has Retail buyer. Per outpatient case manager patient has been noncompliant with MD visits as well. Patient will need new Rx's for medications listed above- she may benefit from a delivery pharmacy. CM did call to see if the outpatient Case Manager can assist with this pharmacy transition. CM did discuss the case with the inpatient diabetes coordinator. This CM wanted to know if patient had attended outpatient diabetes classes. Per diabetes coordinator patient has taken outpatient diabetes classes. Patient has Medicaid Transportation-for appointments. CM will continue to monitor for additional transition of care needs. Bethena Roys, RN,BSN Case Manager [phone]  1236 02-21-19 CM received call back from community case manager and she states that the mother is able to pick up medications at the actual pharmacy enrolled in [place]. Patient just needs to compliant with MD visits and actually administering and taking medications. Bethena Roys, RN, BSN Case Manager [phone]

## 2019-02-21 NOTE — Progress Notes (Signed)
Inpatient Diabetes Program Recommendations  AACE/ADA: New Consensus Statement on Inpatient Glycemic Control (2015)  Target Ranges:  Prepandial:   less than 140 mg/dL      Peak postprandial:   less than 180 mg/dL (1-2 hours)      Critically ill patients:  140 - 180 mg/dL   Lab Results  Component Value Date   GLUCAP 206 (H) 02/21/2019   HGBA1C 10.6 (H) 02/20/2019    Review of Glycemic Control   Current orders for Inpatient glycemic control: Lantus 10 units QD, Novolog 0-9 units Q4H + 3 units tidwc  Blood sugars > 200 mg/dL this am HgbA1C - 10.6%  Inpatient Diabetes Program Recommendations:     Increase Lantus to 15 units QD Increase Novolog to 0-9 units tidwc and 0-5 units QHS Increase Novolog meal coverage to 4 units tidwc.  Will speak with pt about uncontrolled diabetes and HgbA1C of 10.6%. Will need to f/u with PCP/Endo within a week or two of discharge.   Continue to follow.  Thank you. Lorenda Peck, RD, LDN, CDE Inpatient Diabetes Coordinator [phone]

## 2019-02-21 NOTE — Progress Notes (Signed)
Inpatient Diabetes Program Recommendations  AACE/ADA: New Consensus Statement on Inpatient Glycemic Control (2015)  Target Ranges:  Prepandial:   less than 140 mg/dL      Peak postprandial:   less than 180 mg/dL (1-2 hours)      Critically ill patients:  140 - 180 mg/dL   Lab Results  Component Value Date   GLUCAP 134 (H) 02/21/2019   HGBA1C 10.6 (H) 02/20/2019    Review of Glycemic Control  112-206 mg/dL in past 24H Pt non-compliant with taking meds. Discussed with case manager - pt has Medicaid and should only have $3 copay for meds.   Inpatient Diabetes Program Recommendations:     Increase Lantus to 15 units QD  Unsuccessful in reaching pt by phone to discuss her diabetes.  Will continue to follow.  Thank you. Lorenda Peck, RD, LDN, CDE Inpatient Diabetes Coordinator [phone]

## 2019-02-22 ENCOUNTER — Telehealth: Payer: Self-pay | Admitting: Family Medicine

## 2019-02-22 DIAGNOSIS — E108 Type 1 diabetes mellitus with unspecified complications: Secondary | ICD-10-CM

## 2019-02-22 DIAGNOSIS — K3184 Gastroparesis: Secondary | ICD-10-CM

## 2019-02-22 DIAGNOSIS — R1011 Right upper quadrant pain: Secondary | ICD-10-CM

## 2019-02-22 LAB — BASIC METABOLIC PANEL
Anion gap: 10 (ref 5–15)
BUN: 5 mg/dL — ABNORMAL LOW (ref 6–20)
CO2: 22 mmol/L (ref 22–32)
Calcium: 8.9 mg/dL (ref 8.9–10.3)
Chloride: 102 mmol/L (ref 98–111)
Creatinine, Ser: 0.7 mg/dL (ref 0.44–1.00)
GFR calc Af Amer: >60 mL/min (ref >60)
GFR calc non Af Amer: >60 mL/min (ref >60)
Glucose, Bld: 228 mg/dL — ABNORMAL HIGH (ref 70–99)
Potassium: 3.6 mmol/L (ref 3.5–5.1)
Sodium: 134 mmol/L — ABNORMAL LOW (ref 135–145)

## 2019-02-22 LAB — GLUCOSE, CAPILLARY
Glucose-Capillary: 167 mg/dL — ABNORMAL HIGH (ref 70–99)
Glucose-Capillary: 288 mg/dL — ABNORMAL HIGH (ref 70–99)

## 2019-02-22 LAB — CBC
HCT: 37.6 % (ref 36.0–46.0)
Hemoglobin: 12.8 g/dL (ref 12.0–15.0)
MCH: 29.2 pg (ref 26.0–34.0)
MCHC: 34 g/dL (ref 30.0–36.0)
MCV: 85.8 fL (ref 80.0–100.0)
Platelets: 317 10*3/uL (ref 150–400)
RBC: 4.38 MIL/uL (ref 3.87–5.11)
RDW: 13.1 % (ref 11.5–15.5)
WBC: 14.9 10*3/uL — ABNORMAL HIGH (ref 4.0–10.5)
nRBC: 0 % (ref 0.0–0.2)

## 2019-02-22 MED ORDER — GLUCAGON (RDNA) 1 MG IJ KIT: PACK | INTRAMUSCULAR | 2 refills | Status: DC

## 2019-02-22 MED ORDER — BD PEN NEEDLE MINI U/F 31G X 5 MM MISC: 0 refills | Status: DC

## 2019-02-22 MED ORDER — BLOOD GLUCOSE METER KIT: PACK | 0 refills | Status: DC

## 2019-02-22 MED ORDER — ACCU-CHEK AVIVA PLUS VI STRP: ORAL_STRIP | 0 refills | Status: DC

## 2019-02-22 MED ORDER — METOCLOPRAMIDE HCL 5 MG PO TABS: 5.0000 mg | ORAL_TABLET | Freq: Three times a day (TID) | ORAL | 3 refills | Status: DC

## 2019-02-22 MED ORDER — PRENATAL VITAMINS 28-0.8 MG PO TABS: 1.0000 | ORAL_TABLET | Freq: Every day | ORAL | 3 refills | Status: DC

## 2019-02-22 MED ORDER — INSULIN ASPART 100 UNIT/ML FLEXPEN: 3.0000 [IU] | PEN_INJECTOR | Freq: Three times a day (TID) | SUBCUTANEOUS | 0 refills | Status: DC

## 2019-02-22 MED ORDER — ACCU-CHEK FASTCLIX LANCETS MISC: 3 refills | Status: DC

## 2019-02-22 MED ORDER — INSULIN ASPART 100 UNIT/ML ~~LOC~~ SOLN
0.0000 [IU] | Freq: Three times a day (TID) | SUBCUTANEOUS | Status: DC
  Administered 2019-02-22: 13:00:00 2 [IU] via SUBCUTANEOUS

## 2019-02-22 MED ORDER — INSULIN GLARGINE 100 UNIT/ML SOLOSTAR PEN: 15.0000 [IU] | PEN_INJECTOR | Freq: Every day | SUBCUTANEOUS | 0 refills | Status: DC

## 2019-02-22 MED ORDER — ALCOHOL PADS 70 % PADS: MEDICATED_PAD | 6 refills | Status: DC

## 2019-02-22 NOTE — Discharge Instructions (Signed)
Insulin Instructions for after you leave the hospital  Long acting insulin "Lantus"  Once Daily   Please start with 15  units every morning.   Please write down your fasting glucose (before eating and taking insulin in the morning). Please bring these numbers to your follow up PCP appointment so that your Lantus can be adjusted accordingly.   Meal time insulin "Novolog"  Three times a day before meals  Please take 3 units 30 minutes before every meal.   IF your blood sugar is more than 200, take 5 units  IF your blood sugar is less than 100, do not the meal time insulin   * Please make sure you eat if you take the Novolog *   You can also check your sugars two hours after your meal if you would like to ensure you are taking the correct meal time dose.   If you have any questions, please write them down and ask you PCP at your follow up appointment.    Blood Glucose Monitoring, Adult Monitoring your blood sugar (glucose) is an important part of managing your diabetes (diabetes mellitus). Blood glucose monitoring involves checking your blood glucose as often as directed and keeping a record (log) of your results over time. Checking your blood glucose regularly and keeping a blood glucose log can:  Help you and your health care provider adjust your diabetes management plan as needed, including your medicines or insulin.  Help you understand how food, exercise, illnesses, and medicines affect your blood glucose.  Let you know what your blood glucose is at any time. You can quickly find out if you have low blood glucose (hypoglycemia) or high blood glucose (hyperglycemia). Your health care provider will set individualized treatment goals for you. Your goals will be based on your age, other medical conditions you have, and how you respond to diabetes treatment. Generally, the goal of treatment is to maintain the following blood glucose levels:  Before meals (preprandial): 80-130  mg/dL (4.4-7.2 mmol/L).  After meals (postprandial): below 180 mg/dL (10 mmol/L).  A1c level: less than 7%. Supplies needed:  Blood glucose meter.  Test strips for your meter. Each meter has its own strips. You must use the strips that came with your meter.  A needle to prick your finger (lancet). Do not use a lancet more than one time.  A device that holds the lancet (lancing device).  A journal or log book to write down your results. How to check your blood glucose  1. Wash your hands with soap and water. 2. Prick the side of your finger (not the tip) with the lancet. Use a different finger each time. 3. Gently rub the finger until a small drop of blood appears. 4. Follow instructions that come with your meter for inserting the test strip, applying blood to the strip, and using your blood glucose meter. 5. Write down your result and any notes. Some meters allow you to use areas of your body other than your finger (alternative sites) to test your blood. The most common alternative sites are:  Forearm.  Thigh.  Palm of the hand. If you think you may have hypoglycemia, or if you have a history of not knowing when your blood glucose is getting low (hypoglycemia unawareness), do not use alternative sites. Use your finger instead. Alternative sites may not be as accurate as the fingers, because blood flow is slower in these areas. This means that the result you get may be delayed, and  it may be different from the result that you would get from your finger. Follow these instructions at home: Blood glucose log   Every time you check your blood glucose, write down your result. Also write down any notes about things that may be affecting your blood glucose, such as your diet and exercise for the day. This information can help you and your health care provider: ? Look for patterns in your blood glucose over time. ? Adjust your diabetes management plan as needed.  Check if your meter  allows you to download your records to a computer. Most glucose meters store a record of glucose readings in the meter. If you have type 1 diabetes:  Check your blood glucose 2 or more times a day.  Also check your blood glucose: ? Before every insulin injection. ? Before and after exercise. ? Before meals. ? 2 hours after a meal. ? Occasionally between 2:00 a.m. and 3:00 a.m., as directed. ? Before potentially dangerous tasks, like driving or using heavy machinery. ? At bedtime.  You may need to check your blood glucose more often, up to 6-10 times a day, if you: ? Use an insulin pump. ? Need multiple daily injections (MDI). ? Have diabetes that is not well-controlled. ? Are ill. ? Have a history of severe hypoglycemia. ? Have hypoglycemia unawareness. If you have type 2 diabetes:  If you take insulin or other diabetes medicines, check your blood glucose 2 or more times a day.  If you are on intensive insulin therapy, check your blood glucose 4 or more times a day. Occasionally, you may also need to check between 2:00 a.m. and 3:00 a.m., as directed.  Also check your blood glucose: ? Before and after exercise. ? Before potentially dangerous tasks, like driving or using heavy machinery.  You may need to check your blood glucose more often if: ? Your medicine is being adjusted. ? Your diabetes is not well-controlled. ? You are ill. General tips  Always keep your supplies with you.  If you have questions or need help, all blood glucose meters have a 24-hour "hotline" phone number that you can call. You may also contact your health care provider.  After you use a few boxes of test strips, adjust (calibrate) your blood glucose meter by following instructions that came with your meter. Contact a health care provider if:  Your blood glucose is at or above 240 mg/dL (13.3 mmol/L) for 2 days in a row.  You have been sick or have had a fever for 2 days or longer, and you are not  getting better.  You have any of the following problems for more than 6 hours: ? You cannot eat or drink. ? You have nausea or vomiting. ? You have diarrhea. Get help right away if:  Your blood glucose is lower than 54 mg/dL (3 mmol/L).  You become confused or you have trouble thinking clearly.  You have difficulty breathing.  You have moderate or large ketone levels in your urine. Summary  Monitoring your blood sugar (glucose) is an important part of managing your diabetes (diabetes mellitus).  Blood glucose monitoring involves checking your blood glucose as often as directed and keeping a record (log) of your results over time.  Your health care provider will set individualized treatment goals for you. Your goals will be based on your age, other medical conditions you have, and how you respond to diabetes treatment.  Every time you check your blood glucose, write down  your result. Also write down any notes about things that may be affecting your blood glucose, such as your diet and exercise for the day. This information is not intended to replace advice given to you by your health care provider. Make sure you discuss any questions you have with your health care provider. Document Released: 07/13/2003 Document Revised: 05/03/2018 Document Reviewed: 12/20/2015 Elsevier Patient Education  2020 [employer].  Type 1 Diabetes Mellitus, Diagnosis, Adult  Type 1 diabetes (type 1 diabetes mellitus) is a long-term (chronic) disease. It occurs when the pancreas does not make enough of a hormone called insulin. Normally, insulin allows sugars (glucose) to enter cells in the body. The cells use glucose for energy. Lack of insulin causes excess glucose to build up in the blood instead of going into cells. As a result, high blood glucose (hyperglycemia) develops. The exact cause of type 1 diabetes is not known. There is currently no cure for type 1 diabetes, but it can be managed with insulin  treatment and lifestyle changes. What increases the risk? You may be more likely to develop this condition if you have a family member who has type 1 diabetes. Other factors may also make you more likely to develop type 1 diabetes, such as:  Having a gene for type 1 diabetes that is passed along from parent to child (inherited).  Living in an area with cold weather conditions.  Exposure to certain viruses.  Certain conditions in which the body's disease-fighting (immune) system attacks itself (autoimmune disorders). What are the signs or symptoms? Symptoms may develop gradually over days or weeks, or they may develop suddenly. Symptoms may include:  Increased thirst (polydipsia).  Increased hunger(polyphagia).  Increased urination (polyuria).  Increased urination during the night (nocturia).  Sudden or unexplained weight changes.  Frequent infections that keep coming back (recurring).  Fatigue.  Weakness.  Vision changes, such as blurry vision.  Fruity-smelling breath.  Cuts or bruises that are slow to heal.  Tingling or numbness in the hands or feet. How is this diagnosed? This condition is diagnosed based on your symptoms, your medical history, a physical exam, and your blood glucose level. Your blood glucose may be checked with one or more of the following blood tests:  A fasting blood glucose (FBG) test. You will not be allowed to eat (you will fast) for 8 hours or longer before a blood sample is taken.  A random blood glucose test. This checks blood glucose at any time of day regardless of when you ate.  An A1c (hemoglobin A1c) blood test. This provides information about blood glucose control over the previous 2-3 months. You may be diagnosed with type 1 diabetes if:  Your FBG level is 126 mg/dL (7.0 mmol/L) or higher.  Your random blood glucose level is 200 mg/dL (11.1 mmol/L) or higher.  Your A1c level is 6.5% or higher. These blood tests may be repeated  to confirm your diagnosis. How is this treated? Your treatment may be managed by a specialist called an endocrinologist. Type 1 diabetes can be managed by following instructions from your health care provider about:  Taking insulin daily. This helps to keep your blood glucose levels in the healthy range. ? You may need to adjust your insulin dosage based on how physically active you are and what foods you eat. Your health care provider will tell you how to do this.  Taking medicines to help prevent complications from diabetes, such as: ? Aspirin. ? Medicine to lower cholesterol. ?  Medicine to control blood pressure.  Checking your blood glucose as often as directed.  Making diet and lifestyle changes. These may include: ? Following an individualized nutrition plan that is developed by a diet and nutrition specialist (registered dietitian). ? Exercising regularly. ? Finding ways to manage stress. Your health care provider will set individualized treatment goals for you. Your goals will be based on your age, other medical conditions you have, and how you respond to diabetes treatment. Generally, the goal of treatment is to maintain the following blood glucose levels:  Before meals (preprandial): 80-130 mg/dL (4.4-7.2 mmol/L).  After meals (postprandial): below 180 mg/dL (10 mmol/L).  A1c level: less than 7%. Follow these instructions at home: Questions to ask your health care provider  Consider asking the following questions: ? Do I need to meet with a diabetes educator? ? Should I consider joining a support group for people with diabetes? ? What equipment will I need to manage my diabetes at home? ? What diabetes medicines should I take, and when? ? How often should I check my blood glucose? ? What number should I call if I have questions? ? When is my next appointment? General instructions  Take over-the-counter and prescription medicines only as told by your health care  provider.  Keep all follow-up visits as told by your health care provider. This is important.  For more information about diabetes, visit: ? American Diabetes Association (ADA): www.diabetes.org ? American Association of Diabetes Educators (AADE): www.diabeteseducator.org Contact a health care provider if:  Your blood glucose level is higher than 240 mg/dL (13.3 mmol/L) for 2 days in a row.  You have been sick or have had a fever for 2 days or more and you are not getting better.  You have any of the following problems for more than 6 hours: ? You cannot eat or drink. ? You have nausea and vomiting. ? You have diarrhea. Get help right away if:  Your blood glucose is below 54 mg/dL (3 mmol/L).  You become confused or you have trouble thinking clearly.  You have difficulty breathing.  You have moderate or large ketone levels in your urine. Summary  Type 1 diabetes (type 1 diabetes mellitus) is a long-term (chronic) disease. It occurs when the pancreas does not make enough of a hormone called insulin.  This condition is treated by taking insulin and other medicines to help prevent complications from diabetes. Diet and lifestyle changes are also part of treatment.  Your health care provider will set individualized treatment goals for you. Your goals will be based on your age, other medical conditions you have, and how you respond to diabetes treatment. This information is not intended to replace advice given to you by your health care provider. Make sure you discuss any questions you have with your health care provider. Document Released: 07/07/2000 Document Revised: 06/22/2017 Document Reviewed: 08/13/2015 Elsevier Patient Education  2020 [employer].

## 2019-02-22 NOTE — Telephone Encounter (Signed)
Current outpatient medications:   Current Outpatient Medications:  .  ACCU-CHEK AVIVA PLUS test strip, USE AS DIRECTED, Disp: 100 each, Rfl: 11 .  Accu-Chek FastClix Lancets MISC, Check sugar 10 x daily, Disp: 304 each, Rfl: 3 .  Alcohol Swabs (ALCOHOL PADS) 70 % PADS, Use to wipe skin prior to insulin injections twice daily, Disp: 200 each, Rfl: 6 .  blood glucose meter kit and supplies, Dispense based on patient and insurance preference. Use up to four times daily as directed. (FOR ICD-10 E10.9, E11.9)., Disp: 1 each, Rfl: 0 .  glucagon 1 MG injection, Inject 7m IM if unconscious, seizing, or unable to eat to correct low blood sugar, Disp: 2 each, Rfl: 2 .  glucose blood (ACCU-CHEK AVIVA PLUS) test strip, Please check blood sugars 4 times a day, Disp: 600 each, Rfl: 0 .  insulin aspart (NOVOLOG) 100 UNIT/ML FlexPen, Inject 3-5 Units into the skin 3 (three) times daily with meals., Disp: 13.5 mL, Rfl: 0 .  Insulin Glargine (LANTUS) 100 UNIT/ML Solostar Pen, Inject 15 Units into the skin daily., Disp: 13.5 mL, Rfl: 0 .  Insulin Pen Needle (B-D UF III MINI PEN NEEDLES) 31G X 5 MM MISC, USE TO CHECK BLOOD SUGAR IN THE MORNING BEFORE EATING, BEFORE EACH MEAL, AND AS NEEDED, Disp: 1000 each, Rfl: 0 .  Insulin Pen Needle (B-D UF III MINI PEN NEEDLES) 31G X 5 MM MISC, CHECK BLOOD SUGAR IN THE MORNING BEFORE EATING, BEFORE EACH MEAL, AND AS NEEDED, Disp: 1000 each, Rfl: 0 .  Lancets (ACCU-CHEK SOFT TOUCH) lancets, Use as directed., Disp: 100 each, Rfl: 5 .  metoCLOPramide (REGLAN) 5 MG tablet, Take 1 tablet (5 mg total) by mouth 4 (four) times daily -  before meals and at bedtime., Disp: 120 tablet, Rfl: 3 .  ondansetron (ZOFRAN-ODT) 4 MG disintegrating tablet, Take 1 tablet (4 mg total) by mouth every 6 (six) hours as needed for nausea or vomiting., Disp: 20 tablet, Rfl: 0 .  Prenatal Vit-Fe Fumarate-FA (PRENATAL VITAMINS) 28-0.8 MG TABS, Take 1 tablet by mouth daily., Disp: 90 tablet, Rfl: 3 .   promethazine (PHENERGAN) 12.5 MG tablet, Take 1 tablet (12.5 mg total) by mouth every 6 (six) hours as needed for nausea or vomiting., Disp: 30 tablet, Rfl: 0

## 2019-02-22 NOTE — Progress Notes (Signed)
Pt stable, dc home via wheelchair with paper Rx for glucometer.

## 2019-02-22 NOTE — Progress Notes (Signed)
Patient seen early this morning. She feels markedly improved. Blood glucoses continue to be in appropriate range. Will plan to discharge with all supplies, close follow-up. Will sign progress note for day when available. Dorris Singh, MD  Family Medicine Teaching Service

## 2019-02-23 LAB — NOVEL CORONAVIRUS, NAA (HOSP ORDER, SEND-OUT TO REF LAB; TAT 18-24 HRS): SARS-CoV-2, NAA: NOT DETECTED

## 2019-02-25 LAB — GLUCOSE, CAPILLARY
Glucose-Capillary: 219 mg/dL — ABNORMAL HIGH (ref 70–99)
Glucose-Capillary: 193 mg/dL — ABNORMAL HIGH (ref 70–99)
Glucose-Capillary: 122 mg/dL — ABNORMAL HIGH (ref 70–99)

## 2019-02-27 ENCOUNTER — Ambulatory Visit: Payer: Medicaid Other | Admitting: Family Medicine

## 2019-03-04 ENCOUNTER — Ambulatory Visit (INDEPENDENT_AMBULATORY_CARE_PROVIDER_SITE_OTHER): Payer: Medicaid Other | Admitting: Family Medicine

## 2019-03-04 ENCOUNTER — Encounter: Payer: Self-pay | Admitting: Family Medicine

## 2019-03-04 ENCOUNTER — Other Ambulatory Visit: Payer: Self-pay

## 2019-03-04 VITALS — BP 112/64 | HR 88

## 2019-03-04 DIAGNOSIS — Z3009 Encounter for other general counseling and advice on contraception: Secondary | ICD-10-CM | POA: Insufficient documentation

## 2019-03-04 DIAGNOSIS — Z309 Encounter for contraceptive management, unspecified: Secondary | ICD-10-CM | POA: Insufficient documentation

## 2019-03-04 DIAGNOSIS — E108 Type 1 diabetes mellitus with unspecified complications: Secondary | ICD-10-CM

## 2019-03-04 DIAGNOSIS — Z30011 Encounter for initial prescription of contraceptive pills: Secondary | ICD-10-CM

## 2019-03-04 MED ORDER — ACCU-CHEK AVIVA PLUS VI STRP: ORAL_STRIP | 0 refills | Status: DC

## 2019-03-04 MED ORDER — PRENATAL VITAMIN 27-0.8 MG PO TABS: 1.0000 | ORAL_TABLET | Freq: Every day | ORAL | 3 refills | Status: DC

## 2019-03-04 MED ORDER — NORGESTIMATE-ETH ESTRADIOL 0.25-35 MG-MCG PO TABS: 1.0000 | ORAL_TABLET | Freq: Every day | ORAL | 11 refills | Status: DC

## 2019-03-04 NOTE — Assessment & Plan Note (Signed)
Expresses desire to get pregnant next year, is not currently sexually active.  Is amenable to OCPs until then.  LMP 02/20/2019 and denies sexual intercourse since last. Was instructed to take prenatal vitamins.  Prescription sent for Sprintec to pharmacy.

## 2019-03-04 NOTE — Assessment & Plan Note (Signed)
Doing well since discharge from the hospital with CBGs in appropriate range on current regimen.  No medication changes made today.

## 2019-03-04 NOTE — Patient Instructions (Signed)
It was great to see you!  Our plans for today:  - Accu-Check Aviva Plus is the meter covered by insurance. I sent new strips to your pharmacy. - Continue taking your insulin as you have been doing. - We started birth control today. It is VERY important you stop smoking for your health as well as before you get pregnant. It is also important to stop while you are on birth control as this can increase your risk of blood clots. - Follow up in one month.  Take care and seek immediate care sooner if you develop any concerns.   Dr. [facility]

## 2019-03-04 NOTE — Progress Notes (Signed)
  Subjective:   Patient ID: Sabrina Sutton    DOB: March 28, 1996, 23 y.o. female   MRN: [id number]  Eryn Marandola is a 23 y.o. female with a history of T1DM, h/o pyelonephritis, HSV2 here for   Hospital f/u - admitted for DKA 7/30-8/1 in the setting of missing home insulin, also with persistent N/V, d/ced with Reglan.  - DM regimen: 15u Lantus daily, Novolog 3-5u TID.  Reports compliance. - CBGs: 100-200  -Denies vision changes, jitterniess, shakiness - N/V has improved and is not taking Reglan anymore. - eating 3-4 meals per day, with snacks.  - thinks menses affects CBGs. -Previously expressed interest in getting pregnant.  Patient states that she thinks she may want to have a baby next year.  She is not currently sexually active.  She smokes 3-4 cigarettes/day.  LMP 02/20/2019.  She is not currently on contraception.  She has been prescribed prenatal vitamins but has not taken any yet.  Review of Systems:  Per HPI.  [place]off[place], medications and smoking status reviewed.  Objective:   BP 112/64   Pulse 88   LMP 02/18/2019 (Exact Date)   SpO2 99%  Vitals and nursing note reviewed.  General: well nourished, well developed, in no acute distress with non-toxic appearance CV: regular rate and rhythm without murmurs, rubs, or gallops Lungs: clear to auscultation bilaterally with normal work of breathing Abdomen: soft, non-tender, non-distended, no masses or organomegaly palpable, normoactive bowel sounds Skin: warm, dry, no rashes or lesions Extremities: warm and well perfused, normal tone MSK: ROM grossly intact, strength intact, gait normal Neuro: Alert and oriented, speech normal  Assessment & Plan:   Contraception management Expresses desire to get pregnant next year, is not currently sexually active.  Is amenable to OCPs until then.  LMP 02/20/2019 and denies sexual intercourse since last. Was instructed to take prenatal vitamins.  Prescription sent for Sprintec to pharmacy.  Type  1 diabetes mellitus with complications [facility]) Doing well since discharge from the hospital with CBGs in appropriate range on current regimen.  No medication changes made today.  No orders of the defined types were placed in this encounter.  Meds ordered this encounter  Medications  . norgestimate-ethinyl estradiol (ORTHO-CYCLEN) 0.25-35 MG-MCG tablet    Sig: Take 1 tablet by mouth daily.    Dispense:  1 Package    Refill:  11  . glucose blood (ACCU-CHEK AVIVA PLUS) test strip    Sig: Please check blood sugars 4 times a day    Dispense:  600 each    Refill:  0    Please refill strips that insurance will cover. If not Accu-Check Aviva Plus, please let provider know.  . Prenatal Vit-Fe Fumarate-FA (PRENATAL VITAMIN) 27-0.8 MG TABS    Sig: Take 1 tablet by mouth daily.    Dispense:  90 tablet    Refill:  [place], DO PGY-3, [facility] 03/04/2019 12:14 PM

## 2019-04-08 ENCOUNTER — Telehealth: Payer: Self-pay | Admitting: *Deleted

## 2019-04-08 NOTE — Telephone Encounter (Signed)
Received fax for PA on fastclix lancets.  This is the medicaid preferred lancets.  I think the issue is that it states 10x daily.    If this is correct and you would like to pursue a PA, let me know. Christen Bame, CMA

## 2019-04-09 NOTE — Telephone Encounter (Signed)
Called pharmacy to clarify dosing. Should be 4X a day.   Dalphine Handing, PGY-3 [facility] 04/09/2019 11:29 AM

## 2019-04-11 ENCOUNTER — Encounter (HOSPITAL_COMMUNITY): Payer: Self-pay

## 2019-04-11 ENCOUNTER — Inpatient Hospital Stay (HOSPITAL_COMMUNITY)
Admission: EM | Admit: 2019-04-11 | Discharge: 2019-04-13 | DRG: 638 | Discharge status: Against Medical Advice | Payer: Medicaid Other | Attending: Internal Medicine | Admitting: Internal Medicine

## 2019-04-11 DIAGNOSIS — N179 Acute kidney failure, unspecified: Secondary | ICD-10-CM

## 2019-04-11 DIAGNOSIS — Z833 Family history of diabetes mellitus: Secondary | ICD-10-CM | POA: Diagnosis not present

## 2019-04-11 DIAGNOSIS — E108 Type 1 diabetes mellitus with unspecified complications: Secondary | ICD-10-CM | POA: Diagnosis present

## 2019-04-11 DIAGNOSIS — D72829 Elevated white blood cell count, unspecified: Secondary | ICD-10-CM | POA: Diagnosis present

## 2019-04-11 DIAGNOSIS — R112 Nausea with vomiting, unspecified: Secondary | ICD-10-CM | POA: Diagnosis present

## 2019-04-11 DIAGNOSIS — E876 Hypokalemia: Secondary | ICD-10-CM | POA: Diagnosis present

## 2019-04-11 DIAGNOSIS — Z794 Long term (current) use of insulin: Secondary | ICD-10-CM

## 2019-04-11 DIAGNOSIS — F17210 Nicotine dependence, cigarettes, uncomplicated: Secondary | ICD-10-CM | POA: Diagnosis present

## 2019-04-11 DIAGNOSIS — Z20828 Contact with and (suspected) exposure to other viral communicable diseases: Secondary | ICD-10-CM | POA: Diagnosis present

## 2019-04-11 DIAGNOSIS — R197 Diarrhea, unspecified: Secondary | ICD-10-CM | POA: Diagnosis not present

## 2019-04-11 DIAGNOSIS — Z79899 Other long term (current) drug therapy: Secondary | ICD-10-CM | POA: Diagnosis not present

## 2019-04-11 DIAGNOSIS — E10649 Type 1 diabetes mellitus with hypoglycemia without coma: Secondary | ICD-10-CM | POA: Diagnosis not present

## 2019-04-11 DIAGNOSIS — R748 Abnormal levels of other serum enzymes: Secondary | ICD-10-CM | POA: Diagnosis not present

## 2019-04-11 DIAGNOSIS — E860 Dehydration: Secondary | ICD-10-CM | POA: Diagnosis present

## 2019-04-11 DIAGNOSIS — E1110 Type 2 diabetes mellitus with ketoacidosis without coma: Secondary | ICD-10-CM

## 2019-04-11 DIAGNOSIS — R739 Hyperglycemia, unspecified: Secondary | ICD-10-CM

## 2019-04-11 DIAGNOSIS — E1010 Type 1 diabetes mellitus with ketoacidosis without coma: Principal | ICD-10-CM | POA: Diagnosis present

## 2019-04-11 HISTORY — DX: Diarrhea, unspecified: R19.7

## 2019-04-11 LAB — CBC WITH DIFFERENTIAL/PLATELET
Abs Immature Granulocytes: 0.24 10*3/uL — ABNORMAL HIGH (ref 0.00–0.07)
Basophils Absolute: 0.1 10*3/uL (ref 0.0–0.1)
Basophils Relative: 0 %
Eosinophils Absolute: 0 10*3/uL (ref 0.0–0.5)
Eosinophils Relative: 0 %
HCT: 46.5 % — ABNORMAL HIGH (ref 36.0–46.0)
Hemoglobin: 15.8 g/dL — ABNORMAL HIGH (ref 12.0–15.0)
Immature Granulocytes: 1 %
Lymphocytes Relative: 5 %
Lymphs Abs: 1.2 10*3/uL (ref 0.7–4.0)
MCH: 29 pg (ref 26.0–34.0)
MCHC: 34 g/dL (ref 30.0–36.0)
MCV: 85.5 fL (ref 80.0–100.0)
Monocytes Absolute: 0.4 10*3/uL (ref 0.1–1.0)
Monocytes Relative: 1 %
Neutro Abs: 24.7 10*3/uL — ABNORMAL HIGH (ref 1.7–7.7)
Neutrophils Relative %: 93 %
Platelets: 497 10*3/uL — ABNORMAL HIGH (ref 150–400)
RBC: 5.44 MIL/uL — ABNORMAL HIGH (ref 3.87–5.11)
RDW: 14.3 % (ref 11.5–15.5)
WBC: 26.7 10*3/uL — ABNORMAL HIGH (ref 4.0–10.5)
nRBC: 0 % (ref 0.0–0.2)

## 2019-04-11 LAB — URINALYSIS, ROUTINE W REFLEX MICROSCOPIC
Bilirubin Urine: NEGATIVE
Glucose, UA: >=500 mg/dL — AB
Ketones, ur: 80 mg/dL — AB
Leukocytes,Ua: NEGATIVE
Nitrite: NEGATIVE
Protein, ur: NEGATIVE mg/dL
Specific Gravity, Urine: 1.026 (ref 1.005–1.030)
pH: 5 (ref 5.0–8.0)

## 2019-04-11 LAB — COMPREHENSIVE METABOLIC PANEL
ALT: 39 U/L (ref 0–44)
AST: 40 U/L (ref 15–41)
Albumin: 5.1 g/dL — ABNORMAL HIGH (ref 3.5–5.0)
Alkaline Phosphatase: 147 U/L — ABNORMAL HIGH (ref 38–126)
Anion gap: 28 — ABNORMAL HIGH (ref 5–15)
BUN: 27 mg/dL — ABNORMAL HIGH (ref 6–20)
CO2: 15 mmol/L — ABNORMAL LOW (ref 22–32)
Calcium: 10.3 mg/dL (ref 8.9–10.3)
Chloride: 90 mmol/L — ABNORMAL LOW (ref 98–111)
Creatinine, Ser: 1.33 mg/dL — ABNORMAL HIGH (ref 0.44–1.00)
GFR calc Af Amer: >60 mL/min (ref >60)
GFR calc non Af Amer: 57 mL/min — ABNORMAL LOW (ref >60)
Glucose, Bld: 533 mg/dL (ref 70–99)
Potassium: 4.1 mmol/L (ref 3.5–5.1)
Sodium: 133 mmol/L — ABNORMAL LOW (ref 135–145)
Total Bilirubin: 2.1 mg/dL — ABNORMAL HIGH (ref 0.3–1.2)
Total Protein: 10.3 g/dL — ABNORMAL HIGH (ref 6.5–8.1)

## 2019-04-11 LAB — CBG MONITORING, ED
Glucose-Capillary: 180 mg/dL — ABNORMAL HIGH (ref 70–99)
Glucose-Capillary: 231 mg/dL — ABNORMAL HIGH (ref 70–99)
Glucose-Capillary: 265 mg/dL — ABNORMAL HIGH (ref 70–99)
Glucose-Capillary: 343 mg/dL — ABNORMAL HIGH (ref 70–99)
Glucose-Capillary: 420 mg/dL — ABNORMAL HIGH (ref 70–99)
Glucose-Capillary: 422 mg/dL — ABNORMAL HIGH (ref 70–99)

## 2019-04-11 MED ORDER — SODIUM CHLORIDE 0.9 % IV BOLUS (SEPSIS)
1000.0000 mL | Freq: Once | INTRAVENOUS | Status: AC
  Administered 2019-04-11: 1000 mL via INTRAVENOUS

## 2019-04-11 MED ORDER — ONDANSETRON HCL 4 MG/2ML IJ SOLN
4.0000 mg | Freq: Four times a day (QID) | INTRAMUSCULAR | Status: DC | PRN
  Administered 2019-04-11: 22:00:00 4 mg via INTRAVENOUS
  Filled 2019-04-11: qty 2

## 2019-04-11 MED ORDER — SODIUM CHLORIDE 0.9 % IV SOLN: 1000.0000 mL | INTRAVENOUS | Status: DC

## 2019-04-11 MED ORDER — SODIUM CHLORIDE 0.9 % IV SOLN
INTRAVENOUS | Status: DC
  Administered 2019-04-11: 16:00:00 via INTRAVENOUS

## 2019-04-11 MED ORDER — SODIUM CHLORIDE 0.9 % IV BOLUS (SEPSIS)
1000.0000 mL | Freq: Once | INTRAVENOUS | Status: AC
  Administered 2019-04-11: 14:00:00 1000 mL via INTRAVENOUS

## 2019-04-11 MED ORDER — INSULIN REGULAR(HUMAN) IN NACL 100-0.9 UT/100ML-% IV SOLN
INTRAVENOUS | Status: DC
  Administered 2019-04-11: 3.6 [IU]/h via INTRAVENOUS
  Filled 2019-04-11: qty 100

## 2019-04-11 MED ORDER — DEXTROSE 50 % IV SOLN: 25.0000 mL | INTRAVENOUS | Status: DC | PRN

## 2019-04-11 MED ORDER — DEXTROSE-NACL 5-0.45 % IV SOLN
INTRAVENOUS | Status: DC
  Administered 2019-04-11: 22:00:00 via INTRAVENOUS

## 2019-04-11 MED ORDER — INSULIN REGULAR BOLUS VIA INFUSION
0.0000 [IU] | Freq: Three times a day (TID) | INTRAVENOUS | Status: DC
  Filled 2019-04-11: qty 10

## 2019-04-11 NOTE — Progress Notes (Signed)
Inpatient Diabetes Program Recommendations  AACE/ADA: New Consensus Statement on Inpatient Glycemic Control (2015)  Target Ranges:  Prepandial:   less than 140 mg/dL      Peak postprandial:   less than 180 mg/dL (1-2 hours)      Critically ill patients:  140 - 180 mg/dL   Lab Results  Component Value Date   GLUCAP 420 (H) 04/11/2019   HGBA1C 10.6 (H) 02/20/2019    Review of Glycemic Control  Diabetes history: DM1 Outpatient Diabetes medications: Lantus 15 units QD, Novolog 3-5 units tidwc Current orders for Inpatient glycemic control: IV insulin per DKA orders  HgbA1C - 10.6% - uncontrolled  Inpatient Diabetes Program Recommendations:     When patient out of DKA and ready for transition to subcutaneous insulin, please give basal insulin 2 hrs. prior to D/C of insulin drip and cover CBG with Novolog correction @ time of insulin drip discontinued.  Lantus 15 units QD Novolog 0-9 units Q12H, then tidwc and 0-5 units QHS Novolog 3 units tidwc for meal coverage insulin.  F/U with [facility]. Has been seen multiple times by Inpatient Diabetes Coord.  Thank you. Lorenda Peck, RD, LDN, CDE Inpatient Diabetes Coordinator [phone]

## 2019-04-11 NOTE — H&P (Signed)
.  History and Physical    Sabrina Sutton WOE:[id number] DOB: May 13, 1996 DOA: 04/11/2019  PCP: Rory Percy, DO  Patient coming from: Home   Chief Complaint: Nausea  HPI: Sabrina Sutton is a 23 y.o. female with medical history significant of DMt1. Presents with 2 days of nausea and hyperglycemia. She reports that she ran out of diabetic supplies a couple days ago. She was feeling too sick to go get refills. She describes nausea and some diarrhea all starting about that time. Reports no aggravating or alleviating factors. Reports associated depressed appetite. She has had issues w/ DKA in the past and figured that was what was going on. So she came to the ED.   ED Course: She was found to be hyperglycemic with a gap. TRH was called for admission.   Review of Systems: Reports nausea, diarrhea. Denies CP, dyspnea, palpitations, HA, F. Remainder of 10 point review of systems is otherwise negative for all not mentioned in HPI.    Past Medical History:  Diagnosis Date  . Diabetes mellitus without complication ([place]) 0/03/70   + GAD Ab  . DKA (diabetic ketoacidoses) ([place]) 09/20/2015  . Hypokalemia   . Pyelonephritis 04/17/2016    Past Surgical History:  Procedure Laterality Date  . NO PAST SURGERIES       reports that she has been smoking cigarettes. She has been smoking about 1.00 pack per day. She has never used smokeless tobacco. She reports current alcohol use. She reports current drug use. Drug: Marijuana.  No Known Allergies  Family History  Problem Relation Age of Onset  . Diabetes Maternal Grandmother   . Heart disease Maternal Grandmother        Deceased from MI at age 72  . Hypertension Maternal Grandmother   . Hypercholesterolemia Mother   . Seizures Mother   . Kidney Stones Mother   . Hyperlipidemia Mother   . Stroke Maternal Grandfather        Deceased from stroke at age 40  . Hypertension Paternal Grandmother   . Healthy Father     Prior to Admission  medications   Medication Sig Start Date End Date Taking? Authorizing Provider  glucagon 1 MG injection Inject 57m IM if unconscious, seizing, or unable to eat to correct low blood sugar 02/22/19  Yes KWilber Oliphant MD  insulin aspart (NOVOLOG) 100 UNIT/ML FlexPen Inject 3-5 Units into the skin 3 (three) times daily with meals. 02/22/19 05/23/19 Yes KWilber Oliphant MD  Insulin Glargine (LANTUS) 100 UNIT/ML Solostar Pen Inject 15 Units into the skin daily. 02/22/19 05/23/19 Yes KWilber Oliphant MD  ACCU-CHEK AVIVA PLUS test strip USE AS DIRECTED 06/18/18   RSteve Rattler DO  Accu-Chek FastClix Lancets MISC Check sugar 10 x daily 02/22/19   KWilber Oliphant MD  Alcohol Swabs (ALCOHOL PADS) 70 % PADS Use to wipe skin prior to insulin injections twice daily 02/22/19   KWilber Oliphant MD  blood glucose meter kit and supplies Dispense based on patient and insurance preference. Use up to four times daily as directed. (FOR ICD-10 E10.9, E11.9). 02/22/19   KWilber Oliphant MD  glucose blood (ACCU-CHEK AVIVA PLUS) test strip Please check blood sugars 4 times a day 03/04/19   RRory Percy DO  Insulin Pen Needle (B-D UF III MINI PEN NEEDLES) 31G X 5 MM MISC USE TO CHECK BLOOD SUGAR IN THE MORNING BEFORE EATING, BEFORE EACH MEAL, AND AS NEEDED 02/22/19   KWilber Oliphant MD  Insulin  Pen Needle (B-D UF III MINI PEN NEEDLES) 31G X 5 MM MISC CHECK BLOOD SUGAR IN THE MORNING BEFORE EATING, BEFORE EACH MEAL, AND AS NEEDED 02/22/19   Wilber Oliphant, MD  Lancets (ACCU-CHEK SOFT TOUCH) lancets Use as directed. 10/24/17   Kathrene Alu, MD  metoCLOPramide (REGLAN) 5 MG tablet Take 1 tablet (5 mg total) by mouth 4 (four) times daily -  before meals and at bedtime. Patient not taking: Reported on 04/11/2019 02/22/19   Wilber Oliphant, MD  norgestimate-ethinyl estradiol (ORTHO-CYCLEN) 0.25-35 MG-MCG tablet Take 1 tablet by mouth daily. Patient not taking: Reported on 04/11/2019 03/04/19   Rory Percy, DO  ondansetron (ZOFRAN-ODT) 4 MG  disintegrating tablet Take 1 tablet (4 mg total) by mouth every 6 (six) hours as needed for nausea or vomiting. Patient not taking: Reported on 04/11/2019 11/16/18   Dessa Phi, DO  Prenatal Vit-Fe Fumarate-FA (PRENATAL VITAMIN) 27-0.8 MG TABS Take 1 tablet by mouth daily. Patient not taking: Reported on 04/11/2019 03/04/19   Rory Percy, DO  promethazine (PHENERGAN) 12.5 MG tablet Take 1 tablet (12.5 mg total) by mouth every 6 (six) hours as needed for nausea or vomiting. Patient not taking: Reported on 04/11/2019 11/16/18   Dessa Phi, DO    Physical Exam: Vitals:   04/11/19 1124 04/11/19 1125 04/11/19 1347  BP: 129/84  134/88  Pulse: (!) 105  (!) 121  Resp: 18  (!) 24  Temp: 98.5 F (36.9 C)    TempSrc: Oral    SpO2: 100%  99%  Weight:  54.4 kg   Height:  _0  (1.702 m)     Constitutional: 23 y.o. female NAD, calm, comfortable Vitals:   04/11/19 1124 04/11/19 1125 04/11/19 1347  BP: 129/84  134/88  Pulse: (!) 105  (!) 121  Resp: 18  (!) 24  Temp: 98.5 F (36.9 C)    TempSrc: Oral    SpO2: 100%  99%  Weight:  54.4 kg   Height:  _1  (1.702 m)    General: 23 y.o. female resting in bed in NAD Eyes: PERRL, normal sclera ENMT: Nares patent w/o discharge, orophaynx clear, dentition normal, ears w/o discharge/lesions/ulcers Cardiovascular: tachy, +S1, S2, no m/g/r, equal pulses throughout Respiratory: CTABL, no w/r/r, normal WOB GI: BS+, ND, slight TTP to RUQ, no masses noted, no organomegaly noted MSK: No e/c/c Skin: No rashes, bruises, ulcerations noted Neuro: A&O x 3, no focal deficits Psyc: Appropriate interaction and affect, calm/cooperative   Labs on Admission: I have personally reviewed following labs and imaging studies  CBC: Recent Labs  Lab 04/11/19 1349  WBC 26.7*  NEUTROABS 24.7*  HGB 15.8*  HCT 46.5*  MCV 85.5  PLT 563*   Basic Metabolic Panel: Recent Labs  Lab 04/11/19 1349  NA 133*  K 4.1  CL 90*  CO2 15*  GLUCOSE 533*  BUN 27*   CREATININE 1.33*  CALCIUM 10.3   GFR: Estimated Creatinine Clearance: 57.5 mL/min (A) (by C-G formula based on SCr of 1.33 mg/dL (H)). Liver Function Tests: Recent Labs  Lab 04/11/19 1349  AST 40  ALT 39  ALKPHOS 147*  BILITOT 2.1*  PROT 10.3*  ALBUMIN 5.1*   No results for input(s): LIPASE, AMYLASE in the last 168 hours. No results for input(s): AMMONIA in the last 168 hours. Coagulation Profile: No results for input(s): INR, PROTIME in the last 168 hours. Cardiac Enzymes: No results for input(s): CKTOTAL, CKMB, CKMBINDEX, TROPONINI in the last 168 hours. BNP (last 3  results) No results for input(s): PROBNP in the last 8760 hours. HbA1C: No results for input(s): HGBA1C in the last 72 hours. CBG: No results for input(s): GLUCAP in the last 168 hours. Lipid Profile: No results for input(s): CHOL, HDL, LDLCALC, TRIG, CHOLHDL, LDLDIRECT in the last 72 hours. Thyroid Function Tests: No results for input(s): TSH, T4TOTAL, FREET4, T3FREE, THYROIDAB in the last 72 hours. Anemia Panel: No results for input(s): VITAMINB12, FOLATE, FERRITIN, TIBC, IRON, RETICCTPCT in the last 72 hours. Urine analysis:    Component Value Date/Time   COLORURINE YELLOW 04/11/2019 1245   APPEARANCEUR HAZY (A) 04/11/2019 1245   LABSPEC 1.026 04/11/2019 1245   PHURINE 5.0 04/11/2019 1245   GLUCOSEU >=500 (A) 04/11/2019 1245   HGBUR MODERATE (A) 04/11/2019 1245   BILIRUBINUR NEGATIVE 04/11/2019 1245   KETONESUR 80 (A) 04/11/2019 1245   PROTEINUR NEGATIVE 04/11/2019 1245   UROBILINOGEN 1.0 08/21/2014 1549   NITRITE NEGATIVE 04/11/2019 1245   LEUKOCYTESUR NEGATIVE 04/11/2019 1245    Radiological Exams on Admission: No results found.  Assessment/Plan Principal Problem:   DKA (diabetic ketoacidoses) (HCC) Active Problems:   Type 1 diabetes mellitus with complications (HCC)   Diarrhea   DKA DMt1     - Hasn't been on insulin in about 1.5-2 days d/t lack of supplies     - insulin gtt; q6h BMP      - fluids     - on lantus 15 units daily at home     - admit to inpatient, SDU  Diarrhea     - elevated WBC; may be concentration -- will repeat CBC at 1800 hrs     - 2 days of diarrhea, but not around anyone else with similar symptoms     - check GI panel; if negative, give imodium  Elevated LFTs     - elevated alk phos, t.bili     - complaints of diarrhea     - get ggt, fractionated bili, check RUQ US  DVT prophylaxis: lovenox  Code Status: FULL  Family Communication: None at bedside  Disposition Plan: TBD  Consults called: None  Admission status: Inpatient. Requiring insulin gtt.     Jonnie Finner DO [facility] Pager [phone]  If 7PM-7AM, please contact night-coverage [email or web address] Password [facility]  04/11/2019, 3:13 PM

## 2019-04-11 NOTE — TOC Initial Note (Addendum)
Transition of Care [facility]) - Initial/Assessment Note    Patient Details  Name: Sabrina Sutton MRN: [id number] Date of Birth: 1996/01/13  Transition of [facility]) CM/SW Contact:    Erenest Rasher, RN Phone Number: [phone] 04/11/2019, 4:12 PM  Clinical Narrative:                 Spoke to pt and states she has a glucometer at home. She missed taking her medication one day and states she takes meds as prescribed and can afford meds. Will discuss with attending about having diabetes coordinator consulted.    Expected Discharge Plan: Home/Self Care Barriers to Discharge: Continued Medical Work up   Patient Goals and CMS Choice        Expected Discharge Plan and Services Expected Discharge Plan: Home/Self Care         Expected Discharge Date: (unknown)                                    Prior Living Arrangements/Services   Lives with:: Parents Patient language and need for interpreter reviewed:: Yes Do you feel safe going back to the place where you live?: Yes            Criminal Activity/Legal Involvement Pertinent to Current Situation/Hospitalization: No - Comment as needed  Activities of Daily Living   ADL Screening (condition at time of admission) Patient's cognitive ability adequate to safely complete daily activities?: Yes Is the patient deaf or have difficulty hearing?: No Does the patient have difficulty seeing, even when wearing glasses/contacts?: No Does the patient have difficulty concentrating, remembering, or making decisions?: No Patient able to express need for assistance with ADLs?: Yes Does the patient have difficulty dressing or bathing?: No Independently performs ADLs?: Yes (appropriate for developmental age) Does the patient have difficulty walking or climbing stairs?: No Weakness of Legs: None Weakness of Arms/Hands: None  Permission Sought/Granted Permission sought to share information with : Case Manager, Family Supports,  PCP Permission granted to share information with : Yes, Verbal Permission Granted  Share Information with NAME: Elasha Wingate     Permission granted to share info w Relationship: mother  Permission granted to share info w Contact Information: [phone]  Emotional Assessment       Orientation: : Oriented to Self, Oriented to Place, Oriented to  Time, Oriented to Situation   Psych Involvement: No (comment)  Admission diagnosis:  hyperglycemia Patient Active Problem List   Diagnosis Date Noted  . Diarrhea 04/11/2019  . Contraception management 03/04/2019  . RUQ pain   . DKA (diabetic ketoacidoses) ([place]) 02/20/2019  . Renal mass 08/21/2018  . Near syncope 05/03/2018  . Condyloma acuminatum of vulva 10/31/2017  . 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 Frequent No-show for appointment 11/03/2016  . History of pyelonephritis 04/17/2016  . Type 1 diabetes mellitus with complications ([place]) [id number]   PCP:  Rory Percy, DO Pharmacy:   [facility] [place], [address] [place] [address] Phone: [phone] Fax: [phone]     Social Determinants of Health (SDOH) Interventions    Readmission Risk Interventions No flowsheet data found.

## 2019-04-11 NOTE — ED Notes (Addendum)
Phlebotomy called for blood draw. °

## 2019-04-11 NOTE — ED Provider Notes (Signed)
[facility] DEPT Provider Note   CSN: [id number] Arrival date & time: 04/11/19  1109     History   Chief Complaint Chief Complaint  Patient presents with  . Hyperglycemia    HPI Sabrina Sutton is a 23 y.o. female.     The history is provided by the patient. No language interpreter was used.  Hyperglycemia Blood sugar level PTA:  Over 500 Severity:  Severe Onset quality:  Gradual Timing:  Constant Progression:  Worsening Chronicity:  New Relieved by:  Nothing Ineffective treatments:  None tried Associated symptoms: no abdominal pain   Pt reports she has not been taking her insulin.  Pt reports she ran out of needles to inject insulin  Past Medical History:  Diagnosis Date  . Diabetes mellitus without complication ([place]) 08/07/70   + GAD Ab  . DKA (diabetic ketoacidoses) ([place]) 09/20/2015  . Hypokalemia   . Pyelonephritis 04/17/2016    Patient Active Problem List   Diagnosis Date Noted  . Contraception management 03/04/2019  . RUQ pain   . Renal mass 08/21/2018  . Near syncope 05/03/2018  . Condyloma acuminatum of vulva 10/31/2017  . 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 Frequent No-show for appointment 11/03/2016  . History of pyelonephritis 04/17/2016  . 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 NO PAST SURGERIES       OB History   No obstetric history on file.      Home Medications    Prior to Admission medications   Medication Sig Start Date End Date Taking? Authorizing Provider  glucagon 1 MG injection Inject 92m IM if unconscious, seizing, or unable to eat to correct low blood sugar 02/22/19  Yes KWilber Oliphant MD  insulin aspart (NOVOLOG) 100 UNIT/ML FlexPen Inject 3-5 Units into the skin 3 (three) times daily with meals. 02/22/19 05/23/19 Yes KWilber Oliphant MD  Insulin Glargine (LANTUS) 100 UNIT/ML Solostar Pen Inject 15 Units into the skin daily.  02/22/19 05/23/19 Yes KWilber Oliphant MD  ACCU-CHEK AVIVA PLUS test strip USE AS DIRECTED 06/18/18   RSteve Rattler DO  Accu-Chek FastClix Lancets MISC Check sugar 10 x daily 02/22/19   KWilber Oliphant MD  Alcohol Swabs (ALCOHOL PADS) 70 % PADS Use to wipe skin prior to insulin injections twice daily 02/22/19   KWilber Oliphant MD  blood glucose meter kit and supplies Dispense based on patient and insurance preference. Use up to four times daily as directed. (FOR ICD-10 E10.9, E11.9). 02/22/19   KWilber Oliphant MD  glucose blood (ACCU-CHEK AVIVA PLUS) test strip Please check blood sugars 4 times a day 03/04/19   RRory Percy DO  Insulin Pen Needle (B-D UF III MINI PEN NEEDLES) 31G X 5 MM MISC USE TO CHECK BLOOD SUGAR IN THE MORNING BEFORE EATING, BEFORE EACH MEAL, AND AS NEEDED 02/22/19   KWilber Oliphant MD  Insulin Pen Needle (B-D UF III MINI PEN NEEDLES) 31G X 5 MM MISC CHECK BLOOD SUGAR IN THE MORNING BEFORE EATING, BEFORE EACH MEAL, AND AS NEEDED 02/22/19   KWilber Oliphant MD  Lancets (ACCU-CHEK SOFT TOUCH) lancets Use as directed. 10/24/17   WKathrene Alu MD  metoCLOPramide (REGLAN) 5 MG tablet Take 1 tablet (5 mg total) by mouth 4 (four) times daily -  before meals and at bedtime. Patient not taking: Reported on 04/11/2019 02/22/19   KWilber Oliphant MD  norgestimate-ethinyl  estradiol (ORTHO-CYCLEN) 0.25-35 MG-MCG tablet Take 1 tablet by mouth daily. Patient not taking: Reported on 04/11/2019 03/04/19   Rory Percy, DO  ondansetron (ZOFRAN-ODT) 4 MG disintegrating tablet Take 1 tablet (4 mg total) by mouth every 6 (six) hours as needed for nausea or vomiting. Patient not taking: Reported on 04/11/2019 11/16/18   Dessa Phi, DO  Prenatal Vit-Fe Fumarate-FA (PRENATAL VITAMIN) 27-0.8 MG TABS Take 1 tablet by mouth daily. Patient not taking: Reported on 04/11/2019 03/04/19   Rory Percy, DO  promethazine (PHENERGAN) 12.5 MG tablet Take 1 tablet (12.5 mg total) by mouth every 6 (six) hours as needed for  nausea or vomiting. Patient not taking: Reported on 04/11/2019 11/16/18   Dessa Phi, DO    Family History Family History  Problem Relation Age of Onset  . Diabetes Maternal Grandmother   . Heart disease Maternal Grandmother        Deceased from MI at age 81  . Hypertension Maternal Grandmother   . Hypercholesterolemia Mother   . Seizures Mother   . Kidney Stones Mother   . Hyperlipidemia Mother   . Stroke Maternal Grandfather        Deceased from stroke at age 21  . Hypertension Paternal Grandmother   . Healthy Father     Social History Social History   Tobacco Use  . Smoking status: Current Every Day Smoker    Packs/day: 1.00    Types: Cigarettes  . Smokeless tobacco: Never Used  Substance Use Topics  . Alcohol use: Yes    Comment: Last drank 1 month ago per MD note  . Drug use: Yes    Types: Marijuana    Comment: Last used 1  month ago per MD note     Allergies   Patient has no known allergies.   Review of Systems Review of Systems  Gastrointestinal: Negative for abdominal pain.  All other systems reviewed and are negative.    Physical Exam Updated Vital Signs BP 134/88   Pulse (!) 121   Temp 98.5 F (36.9 C) (Oral)   Resp (!) 24   Ht '5\' 7"'  (1.702 m)   Wt 54.4 kg   LMP 04/11/2019 (Approximate)   SpO2 99%   BMI 18.79 kg/m   Physical Exam Vitals signs and nursing note reviewed.  Constitutional:      Appearance: She is well-developed.  HENT:     Head: Normocephalic.     Nose: Nose normal.     Mouth/Throat:     Mouth: Mucous membranes are moist.  Eyes:     Pupils: Pupils are equal, round, and reactive to light.  Neck:     Musculoskeletal: Normal range of motion.  Cardiovascular:     Rate and Rhythm: Normal rate.     Pulses: Normal pulses.  Pulmonary:     Effort: Pulmonary effort is normal.  Abdominal:     General: Abdomen is flat. There is no distension.  Musculoskeletal: Normal range of motion.  Skin:    General: Skin is warm.   Neurological:     Mental Status: She is alert and oriented to person, place, and time.  Psychiatric:        Mood and Affect: Mood normal.      ED Treatments / Results  Labs (all labs ordered are listed, but only abnormal results are displayed) Labs Reviewed  CBC WITH DIFFERENTIAL/PLATELET - Abnormal; Notable for the following components:      Result Value   WBC 26.7 (*)  RBC 5.44 (*)    Hemoglobin 15.8 (*)    HCT 46.5 (*)    Platelets 497 (*)    Neutro Abs 24.7 (*)    Abs Immature Granulocytes 0.24 (*)    All other components within normal limits  COMPREHENSIVE METABOLIC PANEL - Abnormal; Notable for the following components:   Sodium 133 (*)    Chloride 90 (*)    CO2 15 (*)    Glucose, Bld 533 (*)    BUN 27 (*)    Creatinine, Ser 1.33 (*)    Total Protein 10.3 (*)    Albumin 5.1 (*)    Alkaline Phosphatase 147 (*)    Total Bilirubin 2.1 (*)    GFR calc non Af Amer 57 (*)    Anion gap 28 (*)    All other components within normal limits  URINALYSIS, ROUTINE W REFLEX MICROSCOPIC - Abnormal; Notable for the following components:   APPearance HAZY (*)    Glucose, UA >=500 (*)    Hgb urine dipstick MODERATE (*)    Ketones, ur 80 (*)    Bacteria, UA RARE (*)    All other components within normal limits    EKG None  Radiology No results found.  Procedures Procedures (including critical care time)  Medications Ordered in ED Medications  sodium chloride 0.9 % bolus 1,000 mL (1,000 mLs Intravenous New Bag/Given 04/11/19 1354)    Followed by  sodium chloride 0.9 % bolus 1,000 mL (1,000 mLs Intravenous New Bag/Given 04/11/19 1355)    Followed by  0.9 %  sodium chloride infusion (has no administration in time range)  dextrose 5 %-0.45 % sodium chloride infusion (has no administration in time range)  insulin regular bolus via infusion 0-10 Units (has no administration in time range)  insulin regular, human (MYXREDLIN) 100 units/ 100 mL infusion (has no administration  in time range)  dextrose 50 % solution 25 mL (has no administration in time range)  0.9 %  sodium chloride infusion (has no administration in time range)     Initial Impression / Assessment and Plan / ED Course  I have reviewed the triage vital signs and the nursing notes.  Pertinent labs & imaging results that were available during my care of the patient were reviewed by me and considered in my medical decision making (see chart for details).  Clinical Course as of Apr 10 1448  Fri Apr 10, 2837  933 23 year old diabetic here with elevated blood sugars nausea vomiting.  Her numbers and vitals are consistent with DKA.  She is getting IV fluids and an insulin drip.  Will need to be admitted to the hospital for further management.   [MB]    Clinical Course User Index [MB] Hayden Rasmussen, MD       MDM  PT's glucose 533, bun 27, creat 1.33  co2 is 15, Anion gap 28.   Pt given Iv fluids x 2 liters Glucommander  ordered Hospitalist consulted for admission   Final Clinical Impressions(s) / ED Diagnoses   Final diagnoses:  Diabetic ketoacidosis without coma associated with type 1 diabetes mellitus ([place])  AKI (acute kidney injury) [facility])  Hyperglycemia    ED Discharge Orders    None       Sidney Ace 04/11/19 1458    Hayden Rasmussen, MD 04/11/19 [phone]

## 2019-04-11 NOTE — ED Triage Notes (Signed)
Pt presents with c/o hyperglycemia. Pt reports 2 days of nausea and vomiting, CBG of 463 with EMS. Pt is a diabetic, takes Norvasc and Lantus. Pt also reporting some slight abdominal pain.

## 2019-04-11 NOTE — ED Notes (Signed)
ED TO INPATIENT HANDOFF REPORT  Name/Age/Gender Sabrina Sutton 23 y.o. female  Code Status Code Status History    Date Active Date Inactive Code Status Order ID Comments User Context   02/20/2019 0949 02/22/2019 2115 Full Code [id number]  Guadalupe Dawn, MD ED   11/12/2018 2317 11/16/2018 1742 Full Code [id number]  Bonnell Public, MD Inpatient   10/13/2018 2015 10/15/2018 1343 Full Code [id number]  Ivor Costa, MD ED   08/21/2018 1602 08/24/2018 1519 Full Code [id number]  Elmarie Shiley, MD ED   05/03/2018 2327 05/04/2018 1719 Full Code [id number]  Sherene Sires, DO Inpatient   10/23/2017 2037 10/24/2017 2046 Full Code [id number]  Lovenia Kim, MD Inpatient   06/13/2017 1027 06/15/2017 1726 Full Code [id number]  Tonette Bihari, MD ED   04/17/2016 0540 04/18/2016 1823 Full Code [id number]  Veatrice Bourbon, MD ED   09/20/2015 2211 09/25/2015 1853 Full Code [id number]  Montel Clock, MD ED   Advance Care Planning Activity      Home/SNF/Other Home  Chief Complaint hyperglycemia  Level of Care/Admitting Diagnosis ED Disposition    ED Disposition Condition [facility]: [facility] [[id number]]  Level of Care: Stepdown [14]  Admit to SDU based on following criteria: Severe physiological/psychological symptoms:  Any diagnosis requiring assessment & intervention at least every 4 hours on an ongoing basis to obtain desired patient outcomes including stability and rehabilitation  Covid Evaluation: Asymptomatic Screening Protocol (No Symptoms)  Diagnosis: DKA (diabetic ketoacidoses) [facility]) [id number]  Admitting Physician: Jonnie Finner [id number]  Attending Physician: Jonnie Finner [id number]  Estimated length of stay: past midnight tomorrow  Certification:: I certify this patient will need inpatient services for at least 2 midnights  PT Class (Do Not Modify): Inpatient [101]  PT Acc Code (Do Not Modify): Private [1]       Medical History Past Medical  History:  Diagnosis Date  . Diabetes mellitus without complication ([place]) [id number]   + GAD Ab  . DKA (diabetic ketoacidoses) ([place]) 09/20/2015  . Hypokalemia   . Pyelonephritis 04/17/2016    Allergies No Known Allergies  IV Location/Drains/Wounds Patient Lines/Drains/Airways Status   Active Line/Drains/Airways    Name:   Placement date:   Placement time:   Site:   Days:   Peripheral IV 02/20/19 Left Antecubital   02/20/19    -    Antecubital   50   Peripheral IV 04/11/19 Left;Upper Arm   04/11/19    1355    Arm   less than 1   Peripheral IV 04/11/19 Left;Upper Arm   04/11/19    1351    Arm   less than 1          Labs/Imaging Results for orders placed or performed during the hospital encounter of 04/11/19 (from the past 48 hour(s))  Urinalysis, Routine w reflex microscopic     Status: Abnormal   Collection Time: 04/11/19 12:45 PM  Result Value Ref Range   Color, Urine YELLOW YELLOW   APPearance HAZY (A) CLEAR   Specific Gravity, Urine 1.026 1.005 - 1.030   pH 5.0 5.0 - 8.0   Glucose, UA >=500 (A) NEGATIVE mg/dL   Hgb urine dipstick MODERATE (A) NEGATIVE   Bilirubin Urine NEGATIVE NEGATIVE   Ketones, ur 80 (A) NEGATIVE mg/dL   Protein, ur NEGATIVE NEGATIVE mg/dL   Nitrite NEGATIVE NEGATIVE   Leukocytes,Ua NEGATIVE NEGATIVE   RBC / HPF 6-10 0 - 5 RBC/hpf   WBC, UA  6-10 0 - 5 WBC/hpf   Bacteria, UA RARE (A) NONE SEEN   Squamous Epithelial / LPF 0-5 0 - 5   Mucus PRESENT    Hyaline Casts, UA PRESENT     Comment: Performed at [facility], [place] [address], [place], [place] [address]  CBC with Differential/Platelet     Status: Abnormal   Collection Time: 04/11/19  1:49 PM  Result Value Ref Range   WBC 26.7 (H) 4.0 - 10.5 K/uL   RBC 5.44 (H) 3.87 - 5.11 MIL/uL   Hemoglobin 15.8 (H) 12.0 - 15.0 g/dL   HCT 46.5 (H) 36.0 - 46.0 %   MCV 85.5 80.0 - 100.0 fL   MCH 29.0 26.0 - 34.0 pg   MCHC 34.0 30.0 - 36.0 g/dL   RDW 14.3 11.5 - 15.5 %   Platelets 497 (H)  150 - 400 K/uL   nRBC 0.0 0.0 - 0.2 %   Neutrophils Relative % 93 %   Neutro Abs 24.7 (H) 1.7 - 7.7 K/uL   Lymphocytes Relative 5 %   Lymphs Abs 1.2 0.7 - 4.0 K/uL   Monocytes Relative 1 %   Monocytes Absolute 0.4 0.1 - 1.0 K/uL   Eosinophils Relative 0 %   Eosinophils Absolute 0.0 0.0 - 0.5 K/uL   Basophils Relative 0 %   Basophils Absolute 0.1 0.0 - 0.1 K/uL   WBC Morphology WHITE COUNT CONFIRMED ON SMEAR    Immature Granulocytes 1 %   Abs Immature Granulocytes 0.24 (H) 0.00 - 0.07 K/uL    Comment: Performed at [facility], [place] [address] [place], [place] [address]  Comprehensive metabolic panel     Status: Abnormal   Collection Time: 04/11/19  1:49 PM  Result Value Ref Range   Sodium 133 (L) 135 - 145 mmol/L   Potassium 4.1 3.5 - 5.1 mmol/L   Chloride 90 (L) 98 - 111 mmol/L   CO2 15 (L) 22 - 32 mmol/L   Glucose, Bld 533 (HH) 70 - 99 mg/dL    Comment: CRITICAL RESULT CALLED TO, READ BACK BY AND VERIFIED WITH: CALLED TO SONYA, RN ON 04/11/19 AT 1430 BY LE     BUN 27 (H) 6 - 20 mg/dL   Creatinine, Ser 1.33 (H) 0.44 - 1.00 mg/dL   Calcium 10.3 8.9 - 10.3 mg/dL   Total Protein 10.3 (H) 6.5 - 8.1 g/dL   Albumin 5.1 (H) 3.5 - 5.0 g/dL   AST 40 15 - 41 U/L   ALT 39 0 - 44 U/L   Alkaline Phosphatase 147 (H) 38 - 126 U/L   Total Bilirubin 2.1 (H) 0.3 - 1.2 mg/dL   GFR calc non Af Amer 57 (L) >60 mL/min   GFR calc Af Amer >60 >60 mL/min   Anion gap 28 (H) 5 - 15    Comment: Performed at [facility], [place] [address] [place], [place] [address]  CBG monitoring, ED     Status: Abnormal   Collection Time: 04/11/19  3:28 PM  Result Value Ref Range   Glucose-Capillary 422 (H) 70 - 99 mg/dL  CBG monitoring, ED     Status: Abnormal   Collection Time: 04/11/19  4:34 PM  Result Value Ref Range   Glucose-Capillary 420 (H) 70 - 99 mg/dL  CBG monitoring, ED     Status: Abnormal   Collection Time: 04/11/19  5:47 PM  Result Value Ref Range    Glucose-Capillary 343 (H) 70 - 99  mg/dL   No results found.  Pending Labs Unresulted Labs (From admission, onward)    Start     Ordered   04/11/19 1800  CBC with Differential/Platelet  Once-Timed,   STAT     04/11/19 1702   04/11/19 1529  C Difficile Quick Screen w PCR reflex  (Gastrointestinal Panel by PCR, Stool                                                                                                                                                     *Does Not include CLOSTRIDIUM DIFFICILE testing.**If CDIFF testing is needed, select the C Difficile Quick Screen w PCR reflex order below)  Once, for 24 hours,   STAT     04/11/19 1528   04/11/19 1528  GI pathogen panel by PCR, stool  (Gastrointestinal Panel by PCR, Stool                                                                                                                                                     *Does Not include CLOSTRIDIUM DIFFICILE testing.**If CDIFF testing is needed, select the C Difficile Quick Screen w PCR reflex order below)  Once,   STAT     04/11/19 1528   04/11/19 1449  SARS CORONAVIRUS 2 (TAT 6-24 HRS) Nasopharyngeal Nasopharyngeal Swab  (Asymptomatic/Tier 2 Patients Labs)  Once,   STAT    Question Answer Comment  Is this test for diagnosis or screening Screening   Symptomatic for COVID-19 as defined by CDC No   Hospitalized for COVID-19 No   Admitted to ICU for COVID-19 No   Previously tested for COVID-19 No   Resident in a congregate (group) care setting No   Employed in healthcare setting No   Pregnant No      04/11/19 1448   Signed and Held  Basic metabolic panel  STAT Now then every 4 hours ,   STAT     Signed and Held   Signed and Held  CBC  (enoxaparin (LOVENOX)    CrCl >/= 30 ml/min)  Once,   R    Comments: Baseline for enoxaparin therapy IF NOT ALREADY DRAWN.  Notify MD if PLT < 100 K.    Signed and Held   Signed and Held  Creatinine, serum  (enoxaparin (LOVENOX)    CrCl >/= 30 ml/min)   Once,   R    Comments: Baseline for enoxaparin therapy IF NOT ALREADY DRAWN.    Signed and Held   Signed and Held  Creatinine, serum  (enoxaparin (LOVENOX)    CrCl >/= 30 ml/min)  Weekly,   R    Comments: while on enoxaparin therapy    Signed and Held   Signed and Held  Gamma GT  Once,   R     Signed and Held   Signed and Held  Bilirubin, fractionated(tot/dir/indir)  Once,   R     Signed and Held   Signed and Held  Magnesium  Once,   R     Signed and Held          Vitals/Pain Today's Vitals   04/11/19 1124 04/11/19 1125 04/11/19 1347 04/11/19 1639  BP: 129/84  134/88 128/83  Pulse: (!) 105  (!) 121 97  Resp: 18  (!) 24 20  Temp: 98.5 F (36.9 C)     TempSrc: Oral     SpO2: 100%  99% 99%  Weight:  54.4 kg    Height:  5\' 7"  (1.702 m)    PainSc:  7       Isolation Precautions Enteric precautions (UV disinfection)  Medications Medications  sodium chloride 0.9 % bolus 1,000 mL (0 mLs Intravenous Stopped 04/11/19 1833)    Followed by  sodium chloride 0.9 % bolus 1,000 mL (0 mLs Intravenous Stopped 04/11/19 1833)    Followed by  0.9 %  sodium chloride infusion (1,000 mLs Intravenous Not Given 04/11/19 1538)  dextrose 5 %-0.45 % sodium chloride infusion (has no administration in time range)  insulin regular bolus via infusion 0-10 Units (has no administration in time range)  insulin regular, human (MYXREDLIN) 100 units/ 100 mL infusion (5.7 Units/hr Intravenous Rate/Dose Change 04/11/19 1748)  dextrose 50 % solution 25 mL (has no administration in time range)  0.9 %  sodium chloride infusion ( Intravenous New Bag/Given (Non-Interop) 04/11/19 1538)    Mobility walks

## 2019-04-12 ENCOUNTER — Other Ambulatory Visit: Payer: Self-pay

## 2019-04-12 ENCOUNTER — Inpatient Hospital Stay (HOSPITAL_COMMUNITY): Payer: Medicaid Other

## 2019-04-12 DIAGNOSIS — N179 Acute kidney failure, unspecified: Secondary | ICD-10-CM

## 2019-04-12 DIAGNOSIS — D72829 Elevated white blood cell count, unspecified: Secondary | ICD-10-CM

## 2019-04-12 DIAGNOSIS — R748 Abnormal levels of other serum enzymes: Secondary | ICD-10-CM

## 2019-04-12 LAB — CBG MONITORING, ED
Glucose-Capillary: 104 mg/dL — ABNORMAL HIGH (ref 70–99)
Glucose-Capillary: 113 mg/dL — ABNORMAL HIGH (ref 70–99)
Glucose-Capillary: 121 mg/dL — ABNORMAL HIGH (ref 70–99)
Glucose-Capillary: 126 mg/dL — ABNORMAL HIGH (ref 70–99)
Glucose-Capillary: 130 mg/dL — ABNORMAL HIGH (ref 70–99)
Glucose-Capillary: 133 mg/dL — ABNORMAL HIGH (ref 70–99)
Glucose-Capillary: 142 mg/dL — ABNORMAL HIGH (ref 70–99)
Glucose-Capillary: 151 mg/dL — ABNORMAL HIGH (ref 70–99)
Glucose-Capillary: 160 mg/dL — ABNORMAL HIGH (ref 70–99)
Glucose-Capillary: 163 mg/dL — ABNORMAL HIGH (ref 70–99)
Glucose-Capillary: 171 mg/dL — ABNORMAL HIGH (ref 70–99)
Glucose-Capillary: 173 mg/dL — ABNORMAL HIGH (ref 70–99)
Glucose-Capillary: 81 mg/dL (ref 70–99)

## 2019-04-12 LAB — CBC WITH DIFFERENTIAL/PLATELET
Abs Immature Granulocytes: 0.08 10*3/uL — ABNORMAL HIGH (ref 0.00–0.07)
Basophils Absolute: 0.1 10*3/uL (ref 0.0–0.1)
Basophils Relative: 0 %
Eosinophils Absolute: 1.6 10*3/uL — ABNORMAL HIGH (ref 0.0–0.5)
Eosinophils Relative: 9 %
HCT: 41.7 % (ref 36.0–46.0)
Hemoglobin: 14.2 g/dL (ref 12.0–15.0)
Immature Granulocytes: 0 %
Lymphocytes Relative: 11 %
Lymphs Abs: 2 10*3/uL (ref 0.7–4.0)
MCH: 28.8 pg (ref 26.0–34.0)
MCHC: 34.1 g/dL (ref 30.0–36.0)
MCV: 84.6 fL (ref 80.0–100.0)
Monocytes Absolute: 1.2 10*3/uL — ABNORMAL HIGH (ref 0.1–1.0)
Monocytes Relative: 7 %
Neutro Abs: 13.3 10*3/uL — ABNORMAL HIGH (ref 1.7–7.7)
Neutrophils Relative %: 73 %
Platelets: 447 10*3/uL — ABNORMAL HIGH (ref 150–400)
RBC: 4.93 MIL/uL (ref 3.87–5.11)
RDW: 14.3 % (ref 11.5–15.5)
WBC: 18.2 10*3/uL — ABNORMAL HIGH (ref 4.0–10.5)
nRBC: 0 % (ref 0.0–0.2)

## 2019-04-12 LAB — BASIC METABOLIC PANEL
Anion gap: 15 (ref 5–15)
Anion gap: 9 (ref 5–15)
BUN: 16 mg/dL (ref 6–20)
BUN: 13 mg/dL (ref 6–20)
CO2: 19 mmol/L — ABNORMAL LOW (ref 22–32)
CO2: 21 mmol/L — ABNORMAL LOW (ref 22–32)
Calcium: 9.4 mg/dL (ref 8.9–10.3)
Calcium: 8.8 mg/dL — ABNORMAL LOW (ref 8.9–10.3)
Chloride: 105 mmol/L (ref 98–111)
Chloride: 105 mmol/L (ref 98–111)
Creatinine, Ser: 0.78 mg/dL (ref 0.44–1.00)
Creatinine, Ser: 0.71 mg/dL (ref 0.44–1.00)
GFR calc Af Amer: >60 mL/min (ref >60)
GFR calc Af Amer: >60 mL/min (ref >60)
GFR calc non Af Amer: >60 mL/min (ref >60)
GFR calc non Af Amer: >60 mL/min (ref >60)
Glucose, Bld: 122 mg/dL — ABNORMAL HIGH (ref 70–99)
Glucose, Bld: 166 mg/dL — ABNORMAL HIGH (ref 70–99)
Potassium: 3.6 mmol/L (ref 3.5–5.1)
Potassium: 3.8 mmol/L (ref 3.5–5.1)
Sodium: 139 mmol/L (ref 135–145)
Sodium: 135 mmol/L (ref 135–145)

## 2019-04-12 LAB — BILIRUBIN, FRACTIONATED(TOT/DIR/INDIR)
Bilirubin, Direct: 0.2 mg/dL (ref 0.0–0.2)
Indirect Bilirubin: 0.7 mg/dL (ref 0.3–0.9)
Total Bilirubin: 0.9 mg/dL (ref 0.3–1.2)

## 2019-04-12 LAB — GLUCOSE, CAPILLARY
Glucose-Capillary: 73 mg/dL (ref 70–99)
Glucose-Capillary: 46 mg/dL — ABNORMAL LOW (ref 70–99)
Glucose-Capillary: 62 mg/dL — ABNORMAL LOW (ref 70–99)
Glucose-Capillary: 77 mg/dL (ref 70–99)

## 2019-04-12 LAB — SARS CORONAVIRUS 2 (TAT 6-24 HRS): SARS Coronavirus 2: NEGATIVE

## 2019-04-12 LAB — MAGNESIUM: Magnesium: 2.4 mg/dL (ref 1.7–2.4)

## 2019-04-12 LAB — GAMMA GT: GGT: 26 U/L (ref 7–50)

## 2019-04-12 MED ORDER — INSULIN REGULAR(HUMAN) IN NACL 100-0.9 UT/100ML-% IV SOLN: INTRAVENOUS | Status: DC

## 2019-04-12 MED ORDER — SODIUM CHLORIDE 0.9 % IV SOLN: INTRAVENOUS | Status: AC

## 2019-04-12 MED ORDER — ACETAMINOPHEN 325 MG PO TABS
650.0000 mg | ORAL_TABLET | Freq: Four times a day (QID) | ORAL | Status: DC | PRN
  Administered 2019-04-12 – 2019-04-13 (×2): 650 mg via ORAL
  Filled 2019-04-12 (×2): qty 2

## 2019-04-12 MED ORDER — PROMETHAZINE HCL 25 MG/ML IJ SOLN
12.5000 mg | Freq: Four times a day (QID) | INTRAMUSCULAR | Status: AC | PRN
  Administered 2019-04-12 – 2019-04-13 (×2): 25 mg via INTRAVENOUS
  Filled 2019-04-12 (×2): qty 1

## 2019-04-12 MED ORDER — INSULIN GLARGINE 100 UNIT/ML ~~LOC~~ SOLN
15.0000 [IU] | Freq: Every day | SUBCUTANEOUS | Status: DC
  Administered 2019-04-12: 15 [IU] via SUBCUTANEOUS
  Filled 2019-04-12 (×2): qty 0.15

## 2019-04-12 MED ORDER — DEXTROSE-NACL 5-0.45 % IV SOLN
INTRAVENOUS | Status: DC
  Administered 2019-04-12: 14:00:00 via INTRAVENOUS

## 2019-04-12 MED ORDER — SODIUM CHLORIDE 0.9 % IV SOLN: INTRAVENOUS | Status: DC

## 2019-04-12 MED ORDER — ENOXAPARIN SODIUM 40 MG/0.4ML ~~LOC~~ SOLN
40.0000 mg | Freq: Every day | SUBCUTANEOUS | Status: DC
  Administered 2019-04-13: 11:00:00 40 mg via SUBCUTANEOUS
  Filled 2019-04-12 (×2): qty 0.4

## 2019-04-12 MED ORDER — POTASSIUM CHLORIDE 10 MEQ/100ML IV SOLN
10.0000 meq | INTRAVENOUS | Status: AC
  Administered 2019-04-12 (×2): 10 meq via INTRAVENOUS
  Filled 2019-04-12 (×2): qty 100

## 2019-04-12 MED ORDER — INSULIN ASPART 100 UNIT/ML ~~LOC~~ SOLN
0.0000 [IU] | SUBCUTANEOUS | Status: DC
  Administered 2019-04-12 – 2019-04-13 (×2): 2 [IU] via SUBCUTANEOUS
  Administered 2019-04-13: 13:00:00 5 [IU] via SUBCUTANEOUS
  Filled 2019-04-12: qty 0.15

## 2019-04-12 MED ORDER — ONDANSETRON HCL 4 MG/2ML IJ SOLN
4.0000 mg | Freq: Four times a day (QID) | INTRAMUSCULAR | Status: DC | PRN
  Administered 2019-04-12: 4 mg via INTRAVENOUS
  Filled 2019-04-12: qty 2

## 2019-04-12 MED ORDER — INSULIN ASPART 100 UNIT/ML ~~LOC~~ SOLN
4.0000 [IU] | Freq: Three times a day (TID) | SUBCUTANEOUS | Status: DC
  Filled 2019-04-12: qty 0.04

## 2019-04-12 NOTE — Progress Notes (Addendum)
                                  PROGRESS NOTE                                                                                                                                                                                                             Patient Demographics:    Sabrina Sutton, is a 23 y.o. female, DOB - 04/25/1996, MRN:[id number]  Admit date - 04/11/2019   Admitting Physician No admitting provider for patient encounter.  Outpatient Primary MD for the patient is Rumball, Alison, DO  LOS - 1  Outpatient Specialists:  Chief Complaint  Patient presents with  . Hyperglycemia       Brief Narrative 23-year-old female with type 1 diabetes mellitus presented with 2-day history of nausea and hyperglycemia.  She reports that she ran out of her diabetes supplies and did not inject insulin for a full day.  She was feeling sick to collect her supplies.  Reports nausea and some diarrhea.  Reports being adherent to her diet. In the ED she was found to be in DKA and was admitted for further management.   Subjective:   Patient denies further nausea or diarrhea.  Anion gap still 15.  Insulin drip was discontinued early this morning since her blood was were stable.  Instructed the nurse to resume insulin drip and monitor repeat be made.  Assessment  & Plan :    Principal Problem:   DKA (diabetic ketoacidoses) with type 1 DM (HCC) Still has anion gap of 15.  Patient takes 15 units Lantus daily with NovoLog 3-5 units 3 times daily.  Was hospitalized 1 month back with DKA after missing her insulin. No further nausea or vomiting.  Resume insulin drip as gap is still 15.  Will check be met and if gap closed resume her Lantus and NovoLog and discontinue insulin drip. Received IV fluids Encouraged to be adherent to her insulin regimen.  Tobacco use Counseled on smoking cessation   Acute kidney injury Due to dehydration, resolved with fluids  Nausea and  diarrhea  possibly associated with viral illness. COVID tested negative. Symptoms resolved.  leucocytosis  noted wbc in the 15-25k range for several months. . No clinical signs of infectionReasons unclear. Needs outpt w/up       Code Status : full code  Family Communication  : at bedside  Disposition Plan  : home tomorrow   if stable off insulin drip  Barriers For Discharge : active symptoms  Consults  :  none  Procedures  :none  DVT Prophylaxis  :  Lovenox   Lab Results  Component Value Date   PLT 447 (H) 04/12/2019    Antibiotics  :    Anti-infectives (From admission, onward)   None        Objective:   Vitals:   04/12/19 0730 04/12/19 0830 04/12/19 1000 04/12/19 1030  BP: 125/83 127/88 112/80 121/83  Pulse: 84 79 73 76  Resp: _0 Temp:      TempSrc:      SpO2: 100% 100% 100% 100%  Weight:      Height:        Wt Readings from Last 3 Encounters:  04/11/19 54.4 kg  02/22/19 55.6 kg  11/13/18 52.6 kg     Intake/Output Summary (Last 24 hours) at 04/12/2019 1119 Last data filed at 04/12/2019 0707 Gross per 24 hour  Intake 560 ml  Output -  Net 560 ml     Physical Exam  Gen: not in distress HEENT: no pallor,dry mucosa, supple neck Chest: clear b/l, no added sounds CVS: N S1&S2, no murmurs, GI: soft, NT, ND, BS+ Musculoskeletal: warm, no edema     Data Review:    CBC Recent Labs  Lab 04/11/19 1349 04/12/19 0642  WBC 26.7* 18.2*  HGB 15.8* 14.2  HCT 46.5* 41.7  PLT 497* 447*  MCV 85.5 84.6  MCH 29.0 28.8  MCHC 34.0 34.1  RDW 14.3 14.3  LYMPHSABS 1.2 2.0  MONOABS 0.4 1.2*  EOSABS 0.0 1.6*  BASOSABS 0.1 0.1    Chemistries  Recent Labs  Lab 04/11/19 1349 04/12/19 0424  NA 133* 139  K 4.1 3.6  CL 90* 105  CO2 15* 19*  GLUCOSE 533* 122*  BUN 27* 16  CREATININE 1.33* 0.78  CALCIUM 10.3 9.4  MG  --  2.4  AST 40  --   ALT 39  --   ALKPHOS 147*  --   BILITOT 2.1* 0.9    ------------------------------------------------------------------------------------------------------------------ No results for input(s): CHOL, HDL, LDLCALC, TRIG, CHOLHDL, LDLDIRECT in the last 72 hours.  Lab Results  Component Value Date   HGBA1C 10.6 (H) 02/20/2019   ------------------------------------------------------------------------------------------------------------------ No results for input(s): TSH, T4TOTAL, T3FREE, THYROIDAB in the last 72 hours.  Invalid input(s): FREET3 ------------------------------------------------------------------------------------------------------------------ No results for input(s): VITAMINB12, FOLATE, FERRITIN, TIBC, IRON, RETICCTPCT in the last 72 hours.  Coagulation profile No results for input(s): INR, PROTIME in the last 168 hours.  No results for input(s): DDIMER in the last 72 hours.  Cardiac Enzymes No results for input(s): CKMB, TROPONINI, MYOGLOBIN in the last 168 hours.  Invalid input(s): CK ------------------------------------------------------------------------------------------------------------------ No results found for: BNP  Inpatient Medications  Scheduled Meds: . enoxaparin (LOVENOX) injection  40 mg Subcutaneous Daily  . insulin regular  0-10 Units Intravenous TID WC   Continuous Infusions: . sodium chloride    . sodium chloride Stopped (04/12/19 0329)  . dextrose 5 % and 0.45% NaCl Stopped (04/12/19 0838)  . insulin Stopped (04/12/19 1105)   PRN Meds:.dextrose, ondansetron (ZOFRAN) IV, promethazine  Micro Results Recent Results (from the past 240 hour(s))  SARS CORONAVIRUS 2 (TAT 6-24 HRS) Nasopharyngeal Nasopharyngeal Swab     Status: None   Collection Time: 04/11/19  2:49 PM   Specimen: Nasopharyngeal Swab  Result Value Ref Range Status   SARS Coronavirus 2 NEGATIVE NEGATIVE Final    Comment: (NOTE)  SARS-CoV-2 target nucleic acids are NOT DETECTED. The SARS-CoV-2 RNA is generally detectable in upper  and lower respiratory specimens during the acute phase of infection. Negative results do not preclude SARS-CoV-2 infection, do not rule out co-infections with other pathogens, and should not be used as the sole basis for treatment or other patient management decisions. Negative results must be combined with clinical observations, patient history, and epidemiological information. The expected result is Negative. Fact Sheet for Patients: SugarRoll.be Fact Sheet for Healthcare Providers: https://www.woods-mathews.com/ This test is not yet approved or cleared by the [place] FDA and  has been authorized for detection and/or diagnosis of SARS-CoV-2 by FDA under an Emergency Use Authorization (EUA). This EUA will remain  in effect (meaning this test can be used) for the duration of the COVID-19 declaration under Section 56 4(b)(1) of the Act, 21 U.S.C. section 360bbb-3(b)(1), unless the authorization is terminated or revoked sooner. Performed at [facility], [address], [place], [place] [address]     Radiology Reports US Abdomen Limited Ruq  Result Date: 04/12/2019 CLINICAL DATA:  Elevated liver enzymes. EXAM: ULTRASOUND ABDOMEN LIMITED RIGHT UPPER QUADRANT COMPARISON:  None. FINDINGS: Gallbladder: No gallstones or wall thickening visualized. No sonographic Murphy sign noted by sonographer. Common bile duct: Diameter: 2 mm Liver: No focal lesion identified. Within normal limits in parenchymal echogenicity. Portal vein is patent on color Doppler imaging with normal direction of blood flow towards the liver. Other: None. IMPRESSION: Normal right upper quadrant ultrasound. Electronically Signed   By: Lajean Manes M.D.   On: 04/12/2019 05:41    Time Spent in minutes  35   Taggart Prasad M.D on 04/12/2019 at 11:19 AM  Between 7am to 7pm - Pager - [phone]  After 7pm go to [email or web address] - password [facility]  [facility] -   Office  [phone]

## 2019-04-12 NOTE — ED Notes (Addendum)
CBG 126  

## 2019-04-12 NOTE — Progress Notes (Signed)
Hypoglycemic Event  CBG: 62  Treatment: 4 oz juice/soda  Symptoms: Hungry  Follow-up CBG: Time:2227 CBG Result:77  Possible Reasons for Event: Medication regimen: meds as listed in previous note  Comments/MD notified:    Regan Rakers

## 2019-04-12 NOTE — Progress Notes (Signed)
Hypoglycemic Event  CBG: 46  Treatment: 8 oz juice/soda  Symptoms: Shaky and Hungry  Follow-up CBG: Time:2126 CBG Result:62  Possible Reasons for Event: Medication regimen: insulin drip and lantus earlier  Comments/MD notified:    Regan Rakers

## 2019-04-12 NOTE — ED Notes (Signed)
Ultrasound at bedside

## 2019-04-12 NOTE — ED Notes (Signed)
Patients glucose is 130. Per glucostabilizer continue infusion at 0 units/hour. Hospitalist paged and made aware. Will continue to monitor patient.

## 2019-04-12 NOTE — ED Notes (Signed)
CBG 173 upon entering into glucose stabilizer, Insulin remains at 0.5 units. Adjusted amount to infuse at 0.5u/h. Will recheck CBG per glucose stabilizer

## 2019-04-12 NOTE — ED Notes (Signed)
Pt moved to hospital bed.

## 2019-04-12 NOTE — ED Notes (Signed)
Spoke with admission provider who requested that I resume the insulin drip. SEE MAR

## 2019-04-12 NOTE — ED Notes (Addendum)
Patients insulin regular, human (MYXREDLIN) was placed at 0 units/hour per Glucostabilizers instructions. Hospitalist paged.

## 2019-04-12 NOTE — Progress Notes (Signed)
RN called back for report. Report recieved

## 2019-04-12 NOTE — ED Notes (Signed)
As this RN was attempting to draw blood from patient's IV, right arm noted to be significantly larger than left arm. +infiltration of IV. Patient denies pain and arm is not cold/hot to touch. IV removed at this time and insulin drip stopped. Royce,RN will obtain IV ultrasound access to resume insulin drip.

## 2019-04-12 NOTE — ED Notes (Signed)
Insulin regular continued at 0 units an hour according to [employer] program. Hospitalist paged again. Will continue to monitor patient.

## 2019-04-12 NOTE — ED Notes (Signed)
Report attempted to receiving RN. RN will call back shortly.

## 2019-04-12 NOTE — ED Notes (Signed)
ED TO INPATIENT HANDOFF REPORT  ED Nurse Name and Phone #: [phone]  S Name/Age/Gender Sabrina Sutton 23 y.o. female Room/Bed: WA19/WA19  Code Status   Code Status: Full Code  Home/SNF/Other Home Patient oriented to: self, place, time and situation Is this baseline? Yes   Triage Complete: Triage complete  Chief Complaint hyperglycemia  Triage Note Pt presents with c/o hyperglycemia. Pt reports 2 days of nausea and vomiting, CBG of 463 with EMS. Pt is a diabetic, takes Norvasc and Lantus. Pt also reporting some slight abdominal pain.   Allergies No Known Allergies  Level of Care/Admitting Diagnosis ED Disposition    ED Disposition Condition Comment   Admit  Hospital Area: [place] [[id number]]  Level of Care: Med-Surg [16]  Covid Evaluation: Confirmed COVID Negative  [facility]: [id number]  Admitting Physician: Louellen Molder [phone]  Attending Physician: Louellen Molder [phone]  Estimated length of stay: past midnight tomorrow  Certification:: I certify this patient will need inpatient services for at least 2 midnights  PT Class (Do Not Modify): Inpatient [101]  PT Acc Code (Do Not Modify): Private [1]       B Medical/Surgery History Past Medical History:  Diagnosis Date  . Diabetes mellitus without complication ([place]) [id number]   + GAD Ab  . DKA (diabetic ketoacidoses) ([place]) 09/20/2015  . Hypokalemia   . Pyelonephritis 04/17/2016   Past Surgical History:  Procedure Laterality Date  . NO PAST SURGERIES       A IV Location/Drains/Wounds Patient Lines/Drains/Airways Status   Active Line/Drains/Airways    Name:   Placement date:   Placement time:   Site:   Days:   Peripheral IV 04/12/19 Left Forearm   04/12/19    1133    Forearm   less than 1   Peripheral IV 04/12/19 Left;Upper Arm   04/12/19    1133    Arm   less than 1          Intake/Output Last 24 hours  Intake/Output Summary (Last 24 hours)  at 04/12/2019 1425 Last data filed at 04/12/2019 [id number] Gross per 24 hour  Intake 560 ml  Output -  Net 560 ml    Labs/Imaging Results for orders placed or performed during the hospital encounter of 04/11/19 (from the past 48 hour(s))  Urinalysis, Routine w reflex microscopic     Status: Abnormal   Collection Time: 04/11/19 12:45 PM  Result Value Ref Range   Color, Urine YELLOW YELLOW   APPearance HAZY (A) CLEAR   Specific Gravity, Urine 1.026 1.005 - 1.030   pH 5.0 5.0 - 8.0   Glucose, UA >=500 (A) NEGATIVE mg/dL   Hgb urine dipstick MODERATE (A) NEGATIVE   Bilirubin Urine NEGATIVE NEGATIVE   Ketones, ur 80 (A) NEGATIVE mg/dL   Protein, ur NEGATIVE NEGATIVE mg/dL   Nitrite NEGATIVE NEGATIVE   Leukocytes,Ua NEGATIVE NEGATIVE   RBC / HPF 6-10 0 - 5 RBC/hpf   WBC, UA 6-10 0 - 5 WBC/hpf   Bacteria, UA RARE (A) NONE SEEN   Squamous Epithelial / LPF 0-5 0 - 5   Mucus PRESENT    Hyaline Casts, UA PRESENT     Comment: Performed at [facility], [place] [address], [place], [place] [address]  CBC with Differential/Platelet     Status: Abnormal   Collection Time: 04/11/19  1:49 PM  Result Value Ref Range   WBC 26.7 (H) 4.0 - 10.5 K/uL   RBC 5.44 (H) 3.87 -  5.11 MIL/uL   Hemoglobin 15.8 (H) 12.0 - 15.0 g/dL   HCT 46.5 (H) 36.0 - 46.0 %   MCV 85.5 80.0 - 100.0 fL   MCH 29.0 26.0 - 34.0 pg   MCHC 34.0 30.0 - 36.0 g/dL   RDW 14.3 11.5 - 15.5 %   Platelets 497 (H) 150 - 400 K/uL   nRBC 0.0 0.0 - 0.2 %   Neutrophils Relative % 93 %   Neutro Abs 24.7 (H) 1.7 - 7.7 K/uL   Lymphocytes Relative 5 %   Lymphs Abs 1.2 0.7 - 4.0 K/uL   Monocytes Relative 1 %   Monocytes Absolute 0.4 0.1 - 1.0 K/uL   Eosinophils Relative 0 %   Eosinophils Absolute 0.0 0.0 - 0.5 K/uL   Basophils Relative 0 %   Basophils Absolute 0.1 0.0 - 0.1 K/uL   WBC Morphology WHITE COUNT CONFIRMED ON SMEAR    Immature Granulocytes 1 %   Abs Immature Granulocytes 0.24 (H) 0.00 - 0.07 K/uL    Comment:  Performed at [facility], [place] [address] [place], [place] [address]  Comprehensive metabolic panel     Status: Abnormal   Collection Time: 04/11/19  1:49 PM  Result Value Ref Range   Sodium 133 (L) 135 - 145 mmol/L   Potassium 4.1 3.5 - 5.1 mmol/L   Chloride 90 (L) 98 - 111 mmol/L   CO2 15 (L) 22 - 32 mmol/L   Glucose, Bld 533 (HH) 70 - 99 mg/dL    Comment: CRITICAL RESULT CALLED TO, READ BACK BY AND VERIFIED WITH: CALLED TO SONYA, RN ON 04/11/19 AT 1430 BY LE     BUN 27 (H) 6 - 20 mg/dL   Creatinine, Ser 1.33 (H) 0.44 - 1.00 mg/dL   Calcium 10.3 8.9 - 10.3 mg/dL   Total Protein 10.3 (H) 6.5 - 8.1 g/dL   Albumin 5.1 (H) 3.5 - 5.0 g/dL   AST 40 15 - 41 U/L   ALT 39 0 - 44 U/L   Alkaline Phosphatase 147 (H) 38 - 126 U/L   Total Bilirubin 2.1 (H) 0.3 - 1.2 mg/dL   GFR calc non Af Amer 57 (L) >60 mL/min   GFR calc Af Amer >60 >60 mL/min   Anion gap 28 (H) 5 - 15    Comment: Performed at [facility], [place] [address] [place], [place] [address]  SARS CORONAVIRUS 2 (TAT 6-24 HRS) Nasopharyngeal Nasopharyngeal Swab     Status: None   Collection Time: 04/11/19  2:49 PM   Specimen: Nasopharyngeal Swab  Result Value Ref Range   SARS Coronavirus 2 NEGATIVE NEGATIVE    Comment: (NOTE) SARS-CoV-2 target nucleic acids are NOT DETECTED. The SARS-CoV-2 RNA is generally detectable in upper and lower respiratory specimens during the acute phase of infection. Negative results do not preclude SARS-CoV-2 infection, do not rule out co-infections with other pathogens, and should not be used as the sole basis for treatment or other patient management decisions. Negative results must be combined with clinical observations, patient history, and epidemiological information. The expected result is Negative. Fact Sheet for Patients: SugarRoll.be Fact Sheet for Healthcare Providers: https://www.woods-mathews.com/ This test  is not yet approved or cleared by the [place] FDA and  has been authorized for detection and/or diagnosis of SARS-CoV-2 by FDA under an Emergency Use Authorization (EUA). This EUA will remain  in effect (meaning this test can be used) for the duration of the COVID-19 declaration under Section 56  4(b)(1) of the Act, 21 U.S.C. section 360bbb-3(b)(1), unless the authorization is terminated or revoked sooner. Performed at [facility], [place] [address] [place], [place] [address]   CBG monitoring, ED     Status: Abnormal   Collection Time: 04/11/19  3:28 PM  Result Value Ref Range   Glucose-Capillary 422 (H) 70 - 99 mg/dL  CBG monitoring, ED     Status: Abnormal   Collection Time: 04/11/19  4:34 PM  Result Value Ref Range   Glucose-Capillary 420 (H) 70 - 99 mg/dL  CBG monitoring, ED     Status: Abnormal   Collection Time: 04/11/19  5:47 PM  Result Value Ref Range   Glucose-Capillary 343 (H) 70 - 99 mg/dL  CBG monitoring, ED     Status: Abnormal   Collection Time: 04/11/19  9:00 PM  Result Value Ref Range   Glucose-Capillary 265 (H) 70 - 99 mg/dL  CBG monitoring, ED     Status: Abnormal   Collection Time: 04/11/19 10:10 PM  Result Value Ref Range   Glucose-Capillary 231 (H) 70 - 99 mg/dL  CBG monitoring, ED     Status: Abnormal   Collection Time: 04/11/19 11:34 PM  Result Value Ref Range   Glucose-Capillary 180 (H) 70 - 99 mg/dL  CBG monitoring, ED     Status: Abnormal   Collection Time: 04/12/19 12:58 AM  Result Value Ref Range   Glucose-Capillary 163 (H) 70 - 99 mg/dL  CBG monitoring, ED     Status: Abnormal   Collection Time: 04/12/19  2:19 AM  Result Value Ref Range   Glucose-Capillary 133 (H) 70 - 99 mg/dL  CBG monitoring, ED     Status: Abnormal   Collection Time: 04/12/19  3:39 AM  Result Value Ref Range   Glucose-Capillary 121 (H) 70 - 99 mg/dL  Basic metabolic panel     Status: Abnormal   Collection Time: 04/12/19  4:24 AM  Result Value Ref Range    Sodium 139 135 - 145 mmol/L   Potassium 3.6 3.5 - 5.1 mmol/L   Chloride 105 98 - 111 mmol/L   CO2 19 (L) 22 - 32 mmol/L   Glucose, Bld 122 (H) 70 - 99 mg/dL   BUN 16 6 - 20 mg/dL   Creatinine, Ser 0.78 0.44 - 1.00 mg/dL   Calcium 9.4 8.9 - 10.3 mg/dL   GFR calc non Af Amer >60 >60 mL/min   GFR calc Af Amer >60 >60 mL/min   Anion gap 15 5 - 15    Comment: Performed at [facility], [place] [address], [place], [place] [address]  Gamma GT     Status: None   Collection Time: 04/12/19  4:24 AM  Result Value Ref Range   GGT 26 7 - 50 U/L    Comment: Performed at [facility], [place] [address] [place], [place] [address]  Bilirubin, fractionated(tot/dir/indir)     Status: None   Collection Time: 04/12/19  4:24 AM  Result Value Ref Range   Total Bilirubin 0.9 0.3 - 1.2 mg/dL   Bilirubin, Direct 0.2 0.0 - 0.2 mg/dL   Indirect Bilirubin 0.7 0.3 - 0.9 mg/dL    Comment: Performed at [facility], [place] [address] [place], [place] [address]  Magnesium     Status: None   Collection Time: 04/12/19  4:24 AM  Result Value Ref Range   Magnesium 2.4 1.7 - 2.4 mg/dL    Comment: Performed at [facility]  [place] [address], [place], [place] [address]  CBG monitoring, ED     Status: Abnormal   Collection Time: 04/12/19  4:48 AM  Result Value Ref Range   Glucose-Capillary 113 (H) 70 - 99 mg/dL  CBG monitoring, ED     Status: Abnormal   Collection Time: 04/12/19  5:56 AM  Result Value Ref Range   Glucose-Capillary 104 (H) 70 - 99 mg/dL  CBC with Differential/Platelet     Status: Abnormal   Collection Time: 04/12/19  6:42 AM  Result Value Ref Range   WBC 18.2 (H) 4.0 - 10.5 K/uL    Comment: WHITE COUNT CONFIRMED ON SMEAR   RBC 4.93 3.87 - 5.11 MIL/uL   Hemoglobin 14.2 12.0 - 15.0 g/dL   HCT 41.7 36.0 - 46.0 %   MCV 84.6 80.0 - 100.0 fL   MCH 28.8 26.0 - 34.0 pg   MCHC 34.1 30.0 - 36.0 g/dL   RDW 14.3 11.5 - 15.5 %   Platelets  447 (H) 150 - 400 K/uL   nRBC 0.0 0.0 - 0.2 %   Neutrophils Relative % 73 %   Neutro Abs 13.3 (H) 1.7 - 7.7 K/uL   Lymphocytes Relative 11 %   Lymphs Abs 2.0 0.7 - 4.0 K/uL   Monocytes Relative 7 %   Monocytes Absolute 1.2 (H) 0.1 - 1.0 K/uL   Eosinophils Relative 9 %   Eosinophils Absolute 1.6 (H) 0.0 - 0.5 K/uL   Basophils Relative 0 %   Basophils Absolute 0.1 0.0 - 0.1 K/uL   RBC Morphology MORPHOLOGY UNREMARKABLE    Immature Granulocytes 0 %   Abs Immature Granulocytes 0.08 (H) 0.00 - 0.07 K/uL    Comment: Performed at [facility], [place] [address], [place], [place] [address]  CBG monitoring, ED     Status: Abnormal   Collection Time: 04/12/19  6:56 AM  Result Value Ref Range   Glucose-Capillary 130 (H) 70 - 99 mg/dL  CBG monitoring, ED     Status: Abnormal   Collection Time: 04/12/19  8:21 AM  Result Value Ref Range   Glucose-Capillary 142 (H) 70 - 99 mg/dL  CBG monitoring, ED     Status: Abnormal   Collection Time: 04/12/19  9:39 AM  Result Value Ref Range   Glucose-Capillary 151 (H) 70 - 99 mg/dL  CBG monitoring, ED     Status: Abnormal   Collection Time: 04/12/19 10:38 AM  Result Value Ref Range   Glucose-Capillary 171 (H) 70 - 99 mg/dL  Basic metabolic panel     Status: Abnormal   Collection Time: 04/12/19 11:23 AM  Result Value Ref Range   Sodium 135 135 - 145 mmol/L   Potassium 3.8 3.5 - 5.1 mmol/L   Chloride 105 98 - 111 mmol/L   CO2 21 (L) 22 - 32 mmol/L   Glucose, Bld 166 (H) 70 - 99 mg/dL   BUN 13 6 - 20 mg/dL   Creatinine, Ser 0.71 0.44 - 1.00 mg/dL   Calcium 8.8 (L) 8.9 - 10.3 mg/dL   GFR calc non Af Amer >60 >60 mL/min   GFR calc Af Amer >60 >60 mL/min   Anion gap 9 5 - 15    Comment: Performed at [facility], [place] [address] [place], [place] [address]  CBG monitoring, ED     Status: Abnormal   Collection Time: 04/12/19 11:36 AM  Result Value Ref Range   Glucose-Capillary 160 (H) 70 -  99 mg/dL  CBG monitoring,  ED     Status: Abnormal   Collection Time: 04/12/19 12:33 PM  Result Value Ref Range   Glucose-Capillary 173 (H) 70 - 99 mg/dL  CBG monitoring, ED     Status: Abnormal   Collection Time: 04/12/19  1:38 PM  Result Value Ref Range   Glucose-Capillary 126 (H) 70 - 99 mg/dL   US Abdomen Limited Ruq  Result Date: 04/12/2019 CLINICAL DATA:  Elevated liver enzymes. EXAM: ULTRASOUND ABDOMEN LIMITED RIGHT UPPER QUADRANT COMPARISON:  None. FINDINGS: Gallbladder: No gallstones or wall thickening visualized. No sonographic Murphy sign noted by sonographer. Common bile duct: Diameter: 2 mm Liver: No focal lesion identified. Within normal limits in parenchymal echogenicity. Portal vein is patent on color Doppler imaging with normal direction of blood flow towards the liver. Other: None. IMPRESSION: Normal right upper quadrant ultrasound. Electronically Signed   By: Lajean Manes M.D.   On: 04/12/2019 05:41    Pending Labs Unresulted Labs (From admission, onward)    Start     Ordered   04/18/19 0500  Creatinine, serum  (enoxaparin (LOVENOX)    CrCl >/= 30 ml/min)  Weekly,   R    Comments: while on enoxaparin therapy    04/12/19 0321   04/13/19 0500  CBC  Tomorrow morning,   R     04/12/19 1130   04/13/19 [id number]  Basic metabolic panel  Tomorrow morning,   R     04/12/19 1130   04/12/19 [id number]  Basic metabolic panel  Once,   STAT     04/12/19 0641   04/12/19 0445  CBC  Once,   STAT     04/12/19 0445   04/11/19 [id number]  Basic metabolic panel  Once,   STAT     04/11/19 2225   04/11/19 1529  C Difficile Quick Screen w PCR reflex  (Gastrointestinal Panel by PCR, Stool                                                                                                                                                     *Does Not include CLOSTRIDIUM DIFFICILE testing.**If CDIFF testing is needed, select the C Difficile Quick Screen w PCR reflex order below)  Once, for 24 hours,   STAT     04/11/19 1528   04/11/19 1528   GI pathogen panel by PCR, stool  (Gastrointestinal Panel by PCR, Stool                                                                                                                                                     *  Does Not include CLOSTRIDIUM DIFFICILE testing.**If CDIFF testing is needed, select the C Difficile Quick Screen w PCR reflex order below)  Once,   STAT     04/11/19 1528          Vitals/Pain Today's Vitals   04/12/19 1030 04/12/19 1130 04/12/19 1200 04/12/19 1408  BP: 121/83 (!) 146/84 (!) 148/83 132/73  Pulse: 76 83 73 87  Resp: 16 18 11 16   Temp:      TempSrc:      SpO2: 100% 100% 100% 100%  Weight:      Height:      PainSc:        Isolation Precautions Enteric precautions (UV disinfection)  Medications Medications  sodium chloride 0.9 % bolus 1,000 mL (0 mLs Intravenous Stopped 04/11/19 1833)    Followed by  sodium chloride 0.9 % bolus 1,000 mL (0 mLs Intravenous Stopped 04/11/19 1833)    Followed by  0.9 %  sodium chloride infusion (1,000 mLs Intravenous Not Given 04/11/19 1538)  insulin regular bolus via infusion 0-10 Units (0 Units Intravenous Not Given 04/12/19 1145)  dextrose 50 % solution 25 mL (has no administration in time range)  0.9 %  sodium chloride infusion ( Intravenous Hold 04/12/19 0434)  0.9 %  sodium chloride infusion ( Intravenous Hold 04/12/19 0329)  dextrose 5 %-0.45 % sodium chloride infusion ( Intravenous New Bag/Given 04/12/19 1354)  enoxaparin (LOVENOX) injection 40 mg (40 mg Subcutaneous Not Given 04/12/19 1144)  ondansetron (ZOFRAN) injection 4 mg (4 mg Intravenous Given 04/12/19 1140)  promethazine (PHENERGAN) injection 12.5-25 mg (25 mg Intravenous Given 04/12/19 0242)  insulin glargine (LANTUS) injection 15 Units (15 Units Subcutaneous Given 04/12/19 1355)  insulin aspart (novoLOG) injection 0-15 Units (2 Units Subcutaneous Given 04/12/19 1355)  insulin aspart (novoLOG) injection 4 Units (has no administration in time range)  potassium  chloride 10 mEq in 100 mL IVPB (0 mEq Intravenous Stopped 04/12/19 0707)    Mobility walks Low fall risk   Focused Assessments Cardiac Assessment Handoff:    Lab Results  Component Value Date   TROPONINI <0.03 10/14/2018   No results found for: DDIMER Does the Patient currently have chest pain? No  ,    R Recommendations: See Admitting Provider Note  Report given to:   Additional Notes:

## 2019-04-12 NOTE — ED Notes (Signed)
Phlebotomy contacted to collect labs for patient as straight stick was unsuccessful. Will continue to monitor patient.

## 2019-04-12 NOTE — ED Notes (Signed)
Lab called in regarding to BMP collection.

## 2019-04-12 NOTE — Progress Notes (Signed)
ED notified that room is ready

## 2019-04-13 DIAGNOSIS — E860 Dehydration: Secondary | ICD-10-CM

## 2019-04-13 DIAGNOSIS — R112 Nausea with vomiting, unspecified: Secondary | ICD-10-CM

## 2019-04-13 DIAGNOSIS — E10649 Type 1 diabetes mellitus with hypoglycemia without coma: Secondary | ICD-10-CM

## 2019-04-13 LAB — GLUCOSE, CAPILLARY
Glucose-Capillary: 100 mg/dL — ABNORMAL HIGH (ref 70–99)
Glucose-Capillary: 120 mg/dL — ABNORMAL HIGH (ref 70–99)
Glucose-Capillary: 135 mg/dL — ABNORMAL HIGH (ref 70–99)
Glucose-Capillary: 203 mg/dL — ABNORMAL HIGH (ref 70–99)
Glucose-Capillary: 55 mg/dL — ABNORMAL LOW (ref 70–99)
Glucose-Capillary: 90 mg/dL (ref 70–99)
Glucose-Capillary: 97 mg/dL (ref 70–99)

## 2019-04-13 LAB — CBC
HCT: 36.3 % (ref 36.0–46.0)
Hemoglobin: 12.2 g/dL (ref 12.0–15.0)
MCH: 28.5 pg (ref 26.0–34.0)
MCHC: 33.6 g/dL (ref 30.0–36.0)
MCV: 84.8 fL (ref 80.0–100.0)
Platelets: 328 10*3/uL (ref 150–400)
RBC: 4.28 MIL/uL (ref 3.87–5.11)
RDW: 13.9 % (ref 11.5–15.5)
WBC: 11.2 10*3/uL — ABNORMAL HIGH (ref 4.0–10.5)
nRBC: 0 % (ref 0.0–0.2)

## 2019-04-13 LAB — BASIC METABOLIC PANEL
Anion gap: 11 (ref 5–15)
BUN: 9 mg/dL (ref 6–20)
CO2: 23 mmol/L (ref 22–32)
Calcium: 9 mg/dL (ref 8.9–10.3)
Chloride: 104 mmol/L (ref 98–111)
Creatinine, Ser: 0.62 mg/dL (ref 0.44–1.00)
GFR calc Af Amer: >60 mL/min (ref >60)
GFR calc non Af Amer: >60 mL/min (ref >60)
Glucose, Bld: 97 mg/dL (ref 70–99)
Potassium: 3.1 mmol/L — ABNORMAL LOW (ref 3.5–5.1)
Sodium: 138 mmol/L (ref 135–145)

## 2019-04-13 MED ORDER — INSULIN GLARGINE 100 UNIT/ML ~~LOC~~ SOLN
10.0000 [IU] | Freq: Once | SUBCUTANEOUS | Status: AC
  Administered 2019-04-13: 10 [IU] via SUBCUTANEOUS
  Filled 2019-04-13: qty 0.1

## 2019-04-13 MED ORDER — KCL IN DEXTROSE-NACL 40-5-0.9 MEQ/L-%-% IV SOLN
INTRAVENOUS | Status: DC
  Administered 2019-04-13: 14:00:00 1000 mL via INTRAVENOUS
  Filled 2019-04-13: qty 1000

## 2019-04-13 MED ORDER — POTASSIUM CHLORIDE CRYS ER 20 MEQ PO TBCR
40.0000 meq | EXTENDED_RELEASE_TABLET | Freq: Once | ORAL | Status: AC
  Administered 2019-04-13: 09:00:00 40 meq via ORAL
  Filled 2019-04-13: qty 2

## 2019-04-13 NOTE — Progress Notes (Signed)
PROGRESS NOTE                                                                                                                                                                                                             Patient Demographics:    Sabrina Sutton, is a 23 y.o. female, DOB - Jan 28, 1996, [id number]  Admit date - 04/11/2019   Admitting Physician Louellen Molder, MD  Outpatient Primary MD for the patient is Rory Percy, DO  LOS - 2  Outpatient Specialists:  Chief Complaint  Patient presents with  . Hyperglycemia       Brief Narrative 23 year old female with type 1 diabetes mellitus presented with 2-day history of nausea and hyperglycemia.  She reports that she ran out of her diabetes supplies and did not inject insulin for a full day.  She was feeling sick to collect her supplies.  Reports nausea and some diarrhea.  Reports being adherent to her diet. In the ED she was found to be in DKA and was admitted for further management.   Subjective:   Patient nauseous and had 2 episode of vomiting this morning.  She had several episodes of hypoglycemia (sugar ranging in the 40s-60s) without any symptoms and reports that she did not get her meal until she got to the floor later in the afternoon.  Denies any abdominal pain, dizziness or fever.  Assessment  & Plan :    Principal Problem:   DKA (diabetic ketoacidoses) with type 1 DM (HCC) Resolved.  Did not get to eat during the afternoon yesterday because of which she had several episodes of asymptomatic hypoglycemia.  Had 2 episodes of vomiting this morning.  Denies any abdominal pain. Reduced her Lantus dose to 10 units this morning and monitor on sliding scale coverage. We will add gentle hydration with D5 NS. PRN antiemetic.  Labs this morning negative with normal gap.  Active problems Hypokalemia Replenished  Tobacco use Counseled on smoking cessation    Acute kidney injury Due to dehydration, resolved with fluids   leucocytosis  noted wbc in the 15-25k range for several months. . No clinical signs of infection.  Suspect due to acute illness.  WBC 11 K this morning.       Code Status : full code  Family Communication  : Friend at bedside  Disposition Plan  :  home tomorrow if nausea and vomiting resolved and able to tolerate p.o. and stable CBG.  Barriers For Discharge : active symptoms  Consults  :  none  Procedures  :none  DVT Prophylaxis  :  Lovenox   Lab Results  Component Value Date   PLT 328 04/13/2019    Antibiotics  :    Anti-infectives (From admission, onward)   None        Objective:   Vitals:   04/12/19 1612 04/12/19 2025 04/12/19 2358 04/13/19 0404  BP: 138/82 123/77 124/89 121/71  Pulse: 86 67 74 78  Resp: 14 18 18 18   Temp: 99.1 F (37.3 C) 98.5 F (36.9 C) 99.3 F (37.4 C) 97.9 F (36.6 C)  TempSrc: Oral Oral Oral Oral  SpO2: 100%  100% 99%  Weight:      Height:        Wt Readings from Last 3 Encounters:  04/11/19 54.4 kg  02/22/19 55.6 kg  11/13/18 52.6 kg     Intake/Output Summary (Last 24 hours) at 04/13/2019 1221 Last data filed at 04/12/2019 1800 Gross per 24 hour  Intake 0 ml  Output -  Net 0 ml    Physical exam Fatigued, not in distress HEENT: Dry mucosa, supple neck Chest: Clear bilaterally CVs: Normal S1-S2,  GI: Soft, nondistended, nontender Musculoskeletal: Warm, no edema  Physical Exam  Gen: not in distress HEENT: no pallor,dry mucosa, supple neck Chest: clear b/l, no added sounds CVS: N S1&S2, no murmurs, GI: soft, NT, ND, BS+ Musculoskeletal: warm, no edema     Data Review:    CBC Recent Labs  Lab 04/11/19 1349 04/12/19 0642 04/13/19 0526  WBC 26.7* 18.2* 11.2*  HGB 15.8* 14.2 12.2  HCT 46.5* 41.7 36.3  PLT 497* 447* 328  MCV 85.5 84.6 84.8  MCH 29.0 28.8 28.5  MCHC 34.0 34.1 33.6  RDW 14.3 14.3 13.9  LYMPHSABS 1.2 2.0  --    MONOABS 0.4 1.2*  --   EOSABS 0.0 1.6*  --   BASOSABS 0.1 0.1  --     Chemistries  Recent Labs  Lab 04/11/19 1349 04/12/19 0424 04/12/19 1123 04/13/19 0526  NA 133* 139 135 138  K 4.1 3.6 3.8 3.1*  CL 90* 105 105 104  CO2 15* 19* 21* 23  GLUCOSE 533* 122* 166* 97  BUN 27* 16 13 9   CREATININE 1.33* 0.78 0.71 0.62  CALCIUM 10.3 9.4 8.8* 9.0  MG  --  2.4  --   --   AST 40  --   --   --   ALT 39  --   --   --   ALKPHOS 147*  --   --   --   BILITOT 2.1* 0.9  --   --    ------------------------------------------------------------------------------------------------------------------ No results for input(s): CHOL, HDL, LDLCALC, TRIG, CHOLHDL, LDLDIRECT in the last 72 hours.  Lab Results  Component Value Date   HGBA1C 10.6 (H) 02/20/2019   ------------------------------------------------------------------------------------------------------------------ No results for input(s): TSH, T4TOTAL, T3FREE, THYROIDAB in the last 72 hours.  Invalid input(s): FREET3 ------------------------------------------------------------------------------------------------------------------ No results for input(s): VITAMINB12, FOLATE, FERRITIN, TIBC, IRON, RETICCTPCT in the last 72 hours.  Coagulation profile No results for input(s): INR, PROTIME in the last 168 hours.  No results for input(s): DDIMER in the last 72 hours.  Cardiac Enzymes No results for input(s): CKMB, TROPONINI, MYOGLOBIN in the last 168 hours.  Invalid input(s): CK ------------------------------------------------------------------------------------------------------------------ No results found for: BNP  Inpatient Medications  Scheduled  Meds: . enoxaparin (LOVENOX) injection  40 mg Subcutaneous Daily  . insulin aspart  0-15 Units Subcutaneous Q4H  . insulin glargine  10 Units Subcutaneous Once  . insulin glargine  15 Units Subcutaneous Daily   Continuous Infusions: . sodium chloride    . sodium chloride Stopped  (04/12/19 0329)  . dextrose 5 % and 0.9 % NaCl with KCl 40 mEq/L    . dextrose 5 % and 0.45% NaCl 125 mL/hr at 04/12/19 1354   PRN Meds:.acetaminophen, dextrose, ondansetron (ZOFRAN) IV  Micro Results Recent Results (from the past 240 hour(s))  SARS CORONAVIRUS 2 (TAT 6-24 HRS) Nasopharyngeal Nasopharyngeal Swab     Status: None   Collection Time: 04/11/19  2:49 PM   Specimen: Nasopharyngeal Swab  Result Value Ref Range Status   SARS Coronavirus 2 NEGATIVE NEGATIVE Final    Comment: (NOTE) SARS-CoV-2 target nucleic acids are NOT DETECTED. The SARS-CoV-2 RNA is generally detectable in upper and lower respiratory specimens during the acute phase of infection. Negative results do not preclude SARS-CoV-2 infection, do not rule out co-infections with other pathogens, and should not be used as the sole basis for treatment or other patient management decisions. Negative results must be combined with clinical observations, patient history, and epidemiological information. The expected result is Negative. Fact Sheet for Patients: SugarRoll.be Fact Sheet for Healthcare Providers: https://www.woods-mathews.com/ This test is not yet approved or cleared by the [place] FDA and  has been authorized for detection and/or diagnosis of SARS-CoV-2 by FDA under an Emergency Use Authorization (EUA). This EUA will remain  in effect (meaning this test can be used) for the duration of the COVID-19 declaration under Section 56 4(b)(1) of the Act, 21 U.S.C. section 360bbb-3(b)(1), unless the authorization is terminated or revoked sooner. Performed at [facility], [place] [address] [place], [place] [address]     Radiology Reports US Abdomen Limited Ruq  Result Date: 04/12/2019 CLINICAL DATA:  Elevated liver enzymes. EXAM: ULTRASOUND ABDOMEN LIMITED RIGHT UPPER QUADRANT COMPARISON:  None. FINDINGS: Gallbladder: No gallstones or wall thickening  visualized. No sonographic Murphy sign noted by sonographer. Common bile duct: Diameter: 2 mm Liver: No focal lesion identified. Within normal limits in parenchymal echogenicity. Portal vein is patent on color Doppler imaging with normal direction of blood flow towards the liver. Other: None. IMPRESSION: Normal right upper quadrant ultrasound. Electronically Signed   By: Lajean Manes M.D.   On: 04/12/2019 05:41    Time Spent in minutes  35   Francyne Arreaga M.D on 04/13/2019 at 12:21 PM  Between 7am to 7pm - Pager - [phone]  After 7pm go to [email or web address] - password [facility]  [facility] -  Office  [phone]

## 2019-04-13 NOTE — Progress Notes (Signed)
Hypoglycemic Event  CBG: 55  Treatment: 8 oz juice/soda  Symptoms: Shaky and Hungry  Follow-up CBG: [id number] CBG Result:100  Possible Reasons for Event: Inadequate meal intake  Comments/MD notified:    Regan Rakers

## 2019-04-13 NOTE — Progress Notes (Signed)
Patient left AMA.  Condition/vitals/assessment unchanged from day shift.  Patient did allow me to take out IV's and signed paper.  Patient was again encouraged to stay, and the risks of leaving were explained multiple times.  Patient acknowledged risks and reported that she would return to ED if her condition changed.  She also was encouraged to check her sugars multiple times a day to ensure she does not drop or go into DKA again. Roderick Pee

## 2019-04-13 NOTE — Progress Notes (Signed)
This RN was just called into room.  Patient's boyfriend reported that she was threatening to pull out IV's and leave.  Patient is wanting to leave AMA.  She does not want to stay here if her boyfriend has to leave.  Patient was encouraged to stay. She was told about the risks of leaving AMA.  Her blood sugars have dropped dangerously low, and patient was warned that it was not safe to leave before being discharged by a provider. She was also told that insurance may not cover her care if she leaves AMA. Patient understands consequences, but still wants to sign AMA form.  AC was notified. He said there will be no exceptions to visitor rule. She will need to sign AMA papers.  MD notified. Roderick Pee

## 2019-04-14 NOTE — Discharge Summary (Signed)
Physician Discharge Summary  Sabrina Sutton KVQ:[id number] DOB: 1996-03-20 DOA: 04/11/2019  PCP: Rory Percy, DO  Admit date: 04/11/2019 Discharge date: 04/14/2019  Admitted From: Home Disposition: Left AGAINST MEDICAL ADVICE  Recommendations for Outpatient Follow-up:  Follow up with PCP in 1 week    Discharge Condition: Left AGAINST MEDICAL ADVICE CODE STATUS: Full code Diet recommendation: Carb modified    Discharge Diagnoses:  Principal Problem:   DKA (diabetic ketoacidoses) ([place])  Active Problems:   Type 1 diabetes mellitus with complications (HCC)   Diarrhea Intractable nausea and vomiting Type 1 diabetes mellitus with hyperglycemia Acute kidney injury Hypokalemia   Brief narrative/HPI 23 year old female with type 1 diabetes mellitus presented with 2-day history of nausea and hyperglycemia.  She reports that she ran out of her diabetes supplies and did not inject insulin for a full day.  She was feeling sick to collect her supplies.  Reports nausea and some diarrhea.  Reports being adherent to her diet. In the ED she was found to be in DKA and was admitted for further management.  Hospital course DKA (diabetic ketoacidoses) with type 1 DM (HCC) Hypoglycemia Resolved with IV fluids and insulin drip..    Patient was hypoglycemic the next day as she had not received her tray until late in the afternoon previously.  Lantus dose was reduced to 10 units and meal coverage was held.  Given gentle hydration with D5 NS. CBGs were stable in the low 100s.  On the evening of 9/20 (day 2 of hospitalization) patient decided to leave home as her boyfriend was not staying there in the hospital.  Patient was instructed by her nurse that she had risks to her health including recurrence of DKA, severe hypoglycemia and even death but patient reported that she understood the consequences to her health of leaving and signed out [place].   Active  problems Hypokalemia Replenished  Tobacco use Counseled on smoking cessation   Acute kidney injury Due to dehydration, resolved with fluids   leucocytosis  noted wbc in the 15-25k range for several months. . No clinical signs of infection.  Suspect due to acute illness.  WBC 11 K on repeat lab.  Follow-up with PCP  Procedure: None Consults: None   Discharge Instructions   Allergies as of 04/13/2019   No Known Allergies     Medication List    ASK your doctor about these medications   Accu-Chek Aviva Plus test strip Generic drug: glucose blood USE AS DIRECTED   Accu-Chek Aviva Plus test strip Generic drug: glucose blood Please check blood sugars 4 times a day   accu-chek soft touch lancets Use as directed.   Accu-Chek FastClix Lancets Misc Check sugar 10 x daily   Alcohol Pads 70 % Pads Use to wipe skin prior to insulin injections twice daily   B-D UF III MINI PEN NEEDLES 31G X 5 MM Misc Generic drug: Insulin Pen Needle USE TO CHECK BLOOD SUGAR IN THE MORNING BEFORE EATING, BEFORE EACH MEAL, AND AS NEEDED   B-D UF III MINI PEN NEEDLES 31G X 5 MM Misc Generic drug: Insulin Pen Needle CHECK BLOOD SUGAR IN THE MORNING BEFORE EATING, BEFORE EACH MEAL, AND AS NEEDED   blood glucose meter kit and supplies Dispense based on patient and insurance preference. Use up to four times daily as directed. (FOR ICD-10 E10.9, E11.9).   glucagon 1 MG injection Inject 78m IM if unconscious, seizing, or unable to eat to correct low blood sugar   insulin  aspart 100 UNIT/ML FlexPen Commonly known as: NOVOLOG Inject 3-5 Units into the skin 3 (three) times daily with meals.   Insulin Glargine 100 UNIT/ML Solostar Pen Commonly known as: LANTUS Inject 15 Units into the skin daily.   metoCLOPramide 5 MG tablet Commonly known as: REGLAN Take 1 tablet (5 mg total) by mouth 4 (four) times daily -  before meals and at bedtime.   norgestimate-ethinyl estradiol 0.25-35 MG-MCG  tablet Commonly known as: ORTHO-CYCLEN Take 1 tablet by mouth daily.   ondansetron 4 MG disintegrating tablet Commonly known as: ZOFRAN-ODT Take 1 tablet (4 mg total) by mouth every 6 (six) hours as needed for nausea or vomiting.   Prenatal Vitamin 27-0.8 MG Tabs Take 1 tablet by mouth daily.   promethazine 12.5 MG tablet Commonly known as: PHENERGAN Take 1 tablet (12.5 mg total) by mouth every 6 (six) hours as needed for nausea or vomiting.       No Known Allergies    Procedures/Studies: US Abdomen Limited Ruq  Result Date: 04/12/2019 CLINICAL DATA:  Elevated liver enzymes. EXAM: ULTRASOUND ABDOMEN LIMITED RIGHT UPPER QUADRANT COMPARISON:  None. FINDINGS: Gallbladder: No gallstones or wall thickening visualized. No sonographic Murphy sign noted by sonographer. Common bile duct: Diameter: 2 mm Liver: No focal lesion identified. Within normal limits in parenchymal echogenicity. Portal vein is patent on color Doppler imaging with normal direction of blood flow towards the liver. Other: None. IMPRESSION: Normal right upper quadrant ultrasound. Electronically Signed   By: Lajean Manes M.D.   On: 04/12/2019 05:41     Subjective: Did not see the patient at the time of discharge.  Discharge Exam: Vitals:   04/13/19 0404 04/13/19 1446  BP: 121/71 (!) 125/93  Pulse: 78 76  Resp: 18 16  Temp: 97.9 F (36.6 C) 98.5 F (36.9 C)  SpO2: 99% 100%   Vitals:   04/12/19 2025 04/12/19 2358 04/13/19 0404 04/13/19 1446  BP: 123/77 124/89 121/71 (!) 125/93  Pulse: 67 74 78 76  Resp: _0 Temp: 98.5 F (36.9 C) 99.3 F (37.4 C) 97.9 F (36.6 C) 98.5 F (36.9 C)  TempSrc: Oral Oral Oral Oral  SpO2:  100% 99% 100%  Weight:      Height:        Patient left during the night and was not examined by me prior to discharge.    The results of significant diagnostics from this hospitalization (including imaging, microbiology, ancillary and laboratory) are listed below for  reference.     Microbiology: Recent Results (from the past 240 hour(s))  SARS CORONAVIRUS 2 (TAT 6-24 HRS) Nasopharyngeal Nasopharyngeal Swab     Status: None   Collection Time: 04/11/19  2:49 PM   Specimen: Nasopharyngeal Swab  Result Value Ref Range Status   SARS Coronavirus 2 NEGATIVE NEGATIVE Final    Comment: (NOTE) SARS-CoV-2 target nucleic acids are NOT DETECTED. The SARS-CoV-2 RNA is generally detectable in upper and lower respiratory specimens during the acute phase of infection. Negative results do not preclude SARS-CoV-2 infection, do not rule out co-infections with other pathogens, and should not be used as the sole basis for treatment or other patient management decisions. Negative results must be combined with clinical observations, patient history, and epidemiological information. The expected result is Negative. Fact Sheet for Patients: SugarRoll.be Fact Sheet for Healthcare Providers: https://www.woods-mathews.com/ This test is not yet approved or cleared by the [place] FDA and  has been authorized for detection and/or diagnosis of SARS-CoV-2 by  FDA under an Emergency Use Authorization (EUA). This EUA will remain  in effect (meaning this test can be used) for the duration of the COVID-19 declaration under Section 56 4(b)(1) of the Act, 21 U.S.C. section 360bbb-3(b)(1), unless the authorization is terminated or revoked sooner. Performed at [facility], [place] [address], [place], [place] [address]      Labs: BNP (last 3 results) No results for input(s): BNP in the last 8760 hours. Basic Metabolic Panel: Recent Labs  Lab 04/11/19 1349 04/12/19 0424 04/12/19 1123 04/13/19 0526  NA 133* 139 135 138  K 4.1 3.6 3.8 3.1*  CL 90* 105 105 104  CO2 15* 19* 21* 23  GLUCOSE 533* 122* 166* 97  BUN 27* _0 CREATININE 1.33* 0.78 0.71 0.62  CALCIUM 10.3 9.4 8.8* 9.0  MG  --  2.4  --   --    Liver Function  Tests: Recent Labs  Lab 04/11/19 1349 04/12/19 0424  AST 40  --   ALT 39  --   ALKPHOS 147*  --   BILITOT 2.1* 0.9  PROT 10.3*  --   ALBUMIN 5.1*  --    No results for input(s): LIPASE, AMYLASE in the last 168 hours. No results for input(s): AMMONIA in the last 168 hours. CBC: Recent Labs  Lab 04/11/19 1349 04/12/19 0642 04/13/19 0526  WBC 26.7* 18.2* 11.2*  NEUTROABS 24.7* 13.3*  --   HGB 15.8* 14.2 12.2  HCT 46.5* 41.7 36.3  MCV 85.5 84.6 84.8  PLT 497* 447* 328   Cardiac Enzymes: No results for input(s): CKTOTAL, CKMB, CKMBINDEX, TROPONINI in the last 168 hours. BNP: Invalid input(s): POCBNP CBG: Recent Labs  Lab 04/13/19 0450 04/13/19 0739 04/13/19 1155 04/13/19 1647 04/13/19 2050  GLUCAP 100* 97 203* 135* 120*   D-Dimer No results for input(s): DDIMER in the last 72 hours. Hgb A1c No results for input(s): HGBA1C in the last 72 hours. Lipid Profile No results for input(s): CHOL, HDL, LDLCALC, TRIG, CHOLHDL, LDLDIRECT in the last 72 hours. Thyroid function studies No results for input(s): TSH, T4TOTAL, T3FREE, THYROIDAB in the last 72 hours.  Invalid input(s): FREET3 Anemia work up No results for input(s): VITAMINB12, FOLATE, FERRITIN, TIBC, IRON, RETICCTPCT in the last 72 hours. Urinalysis    Component Value Date/Time   COLORURINE YELLOW 04/11/2019 1245   APPEARANCEUR HAZY (A) 04/11/2019 1245   LABSPEC 1.026 04/11/2019 1245   PHURINE 5.0 04/11/2019 1245   GLUCOSEU >=500 (A) 04/11/2019 1245   HGBUR MODERATE (A) 04/11/2019 1245   BILIRUBINUR NEGATIVE 04/11/2019 1245   KETONESUR 80 (A) 04/11/2019 1245   PROTEINUR NEGATIVE 04/11/2019 1245   UROBILINOGEN 1.0 08/21/2014 1549   NITRITE NEGATIVE 04/11/2019 1245   LEUKOCYTESUR NEGATIVE 04/11/2019 1245   Sepsis Labs Invalid input(s): PROCALCITONIN,  WBC,  LACTICIDVEN Microbiology Recent Results (from the past 240 hour(s))  SARS CORONAVIRUS 2 (TAT 6-24 HRS) Nasopharyngeal Nasopharyngeal Swab      Status: None   Collection Time: 04/11/19  2:49 PM   Specimen: Nasopharyngeal Swab  Result Value Ref Range Status   SARS Coronavirus 2 NEGATIVE NEGATIVE Final    Comment: (NOTE) SARS-CoV-2 target nucleic acids are NOT DETECTED. The SARS-CoV-2 RNA is generally detectable in upper and lower respiratory specimens during the acute phase of infection. Negative results do not preclude SARS-CoV-2 infection, do not rule out co-infections with other pathogens, and should not be used as the sole basis for treatment or other patient management decisions. Negative results  must be combined with clinical observations, patient history, and epidemiological information. The expected result is Negative. Fact Sheet for Patients: SugarRoll.be Fact Sheet for Healthcare Providers: https://www.woods-mathews.com/ This test is not yet approved or cleared by the [place] FDA and  has been authorized for detection and/or diagnosis of SARS-CoV-2 by FDA under an Emergency Use Authorization (EUA). This EUA will remain  in effect (meaning this test can be used) for the duration of the COVID-19 declaration under Section 56 4(b)(1) of the Act, 21 U.S.C. section 360bbb-3(b)(1), unless the authorization is terminated or revoked sooner. Performed at [facility], [place] [address], [place], [place] [address]      Time coordinating discharge: <30 minutes  SIGNED:   Louellen Molder, MD  [facility] 04/14/2019, 2:16 PM Pager   If 7PM-7AM, please contact night-coverage [email or web address] Password [email or web address]

## 2019-04-15 ENCOUNTER — Ambulatory Visit: Payer: Medicaid Other | Admitting: Family Medicine

## 2019-04-15 NOTE — Progress Notes (Deleted)
  Subjective:   Patient ID: Sabrina Sutton    DOB: [birth year], 23 y.o. female   MRN: [id number]  Sabrina Sutton is a 23 y.o. female with a history of T1DM, h/o pyelonephritis, HSV2 here for   Hospital follow-up - Was admitted 9/18-9/20 for DKA after running out of insulin supplies and being without insulin for a full day.  Left AMA after transitioning off insulin drip. -D/C meds: NovoLog 3-5 units 3 times daily, Lantus 15 units daily. Reglan 5 mg 4 times daily, Zofran, Phenergan. - ***  ***due for pap  Review of Systems:  Per HPI.  Medications and smoking status reviewed.  Objective:   LMP 04/11/2019 (Approximate)  Vitals and nursing note reviewed.  General: well nourished, well developed, in no acute distress with non-toxic appearance HEENT: normocephalic, atraumatic, moist mucous membranes Neck: supple, non-tender without lymphadenopathy CV: regular rate and rhythm without murmurs, rubs, or gallops, no lower extremity edema Lungs: clear to auscultation bilaterally with normal work of breathing Abdomen: soft, non-tender, non-distended, no masses or organomegaly palpable, normoactive bowel sounds Skin: warm, dry, no rashes or lesions Extremities: warm and well perfused, normal tone MSK: ROM grossly intact, gait normal Neuro: Alert and oriented, speech normal  Assessment & Plan:   No problem-specific Assessment & Plan notes found for this encounter.  No orders of the defined types were placed in this encounter.  No orders of the defined types were placed in this encounter.   Rory Percy, DO PGY-3, [facility] 04/15/2019 8:26 AM

## 2019-06-17 ENCOUNTER — Encounter (HOSPITAL_COMMUNITY): Payer: Self-pay

## 2019-06-17 ENCOUNTER — Inpatient Hospital Stay (HOSPITAL_COMMUNITY)
Admission: EM | Admit: 2019-06-17 | Discharge: 2019-06-19 | DRG: 638 | Discharge status: Against Medical Advice | Payer: Medicaid Other | Attending: Internal Medicine | Admitting: Internal Medicine

## 2019-06-17 ENCOUNTER — Other Ambulatory Visit: Payer: Self-pay

## 2019-06-17 DIAGNOSIS — N10 Acute tubulo-interstitial nephritis: Secondary | ICD-10-CM

## 2019-06-17 DIAGNOSIS — Z79899 Other long term (current) drug therapy: Secondary | ICD-10-CM

## 2019-06-17 DIAGNOSIS — F1210 Cannabis abuse, uncomplicated: Secondary | ICD-10-CM | POA: Diagnosis present

## 2019-06-17 DIAGNOSIS — Z91148 Patient's other noncompliance with medication regimen for other reason: Secondary | ICD-10-CM

## 2019-06-17 DIAGNOSIS — Z5329 Procedure and treatment not carried out because of patient's decision for other reasons: Secondary | ICD-10-CM | POA: Diagnosis not present

## 2019-06-17 DIAGNOSIS — E1110 Type 2 diabetes mellitus with ketoacidosis without coma: Secondary | ICD-10-CM | POA: Diagnosis present

## 2019-06-17 DIAGNOSIS — Z833 Family history of diabetes mellitus: Secondary | ICD-10-CM

## 2019-06-17 DIAGNOSIS — Z20828 Contact with and (suspected) exposure to other viral communicable diseases: Secondary | ICD-10-CM | POA: Diagnosis present

## 2019-06-17 DIAGNOSIS — E876 Hypokalemia: Secondary | ICD-10-CM

## 2019-06-17 DIAGNOSIS — Z8349 Family history of other endocrine, nutritional and metabolic diseases: Secondary | ICD-10-CM

## 2019-06-17 DIAGNOSIS — Z794 Long term (current) use of insulin: Secondary | ICD-10-CM

## 2019-06-17 DIAGNOSIS — F17210 Nicotine dependence, cigarettes, uncomplicated: Secondary | ICD-10-CM | POA: Diagnosis present

## 2019-06-17 DIAGNOSIS — R112 Nausea with vomiting, unspecified: Secondary | ICD-10-CM | POA: Diagnosis present

## 2019-06-17 DIAGNOSIS — E1010 Type 1 diabetes mellitus with ketoacidosis without coma: Principal | ICD-10-CM | POA: Diagnosis present

## 2019-06-17 DIAGNOSIS — Z9114 Patient's other noncompliance with medication regimen: Secondary | ICD-10-CM

## 2019-06-17 DIAGNOSIS — Z8249 Family history of ischemic heart disease and other diseases of the circulatory system: Secondary | ICD-10-CM

## 2019-06-17 DIAGNOSIS — Z823 Family history of stroke: Secondary | ICD-10-CM

## 2019-06-17 LAB — URINALYSIS, ROUTINE W REFLEX MICROSCOPIC
Bilirubin Urine: NEGATIVE
Glucose, UA: 50 mg/dL — AB
Hgb urine dipstick: NEGATIVE
Ketones, ur: 80 mg/dL — AB
Nitrite: NEGATIVE
Protein, ur: 100 mg/dL — AB
Specific Gravity, Urine: 1.023 (ref 1.005–1.030)
WBC, UA: >50 WBC/hpf — ABNORMAL HIGH (ref 0–5)
pH: 5 (ref 5.0–8.0)

## 2019-06-17 LAB — LACTIC ACID, PLASMA: Lactic Acid, Venous: 2.1 mmol/L (ref 0.5–1.9)

## 2019-06-17 LAB — CBC
HCT: 47.8 % — ABNORMAL HIGH (ref 36.0–46.0)
Hemoglobin: 16.7 g/dL — ABNORMAL HIGH (ref 12.0–15.0)
MCH: 29.2 pg (ref 26.0–34.0)
MCHC: 34.9 g/dL (ref 30.0–36.0)
MCV: 83.6 fL (ref 80.0–100.0)
Platelets: 484 10*3/uL — ABNORMAL HIGH (ref 150–400)
RBC: 5.72 MIL/uL — ABNORMAL HIGH (ref 3.87–5.11)
RDW: 13.3 % (ref 11.5–15.5)
WBC: 12.8 10*3/uL — ABNORMAL HIGH (ref 4.0–10.5)
nRBC: 0 % (ref 0.0–0.2)

## 2019-06-17 LAB — I-STAT BETA HCG BLOOD, ED (MC, WL, AP ONLY): I-stat hCG, quantitative: <5 m[IU]/mL (ref <5)

## 2019-06-17 LAB — CBG MONITORING, ED: Glucose-Capillary: 216 mg/dL — ABNORMAL HIGH (ref 70–99)

## 2019-06-17 LAB — APTT: aPTT: 25 seconds (ref 24–36)

## 2019-06-17 LAB — PROTIME-INR
INR: 0.9 (ref 0.8–1.2)
Prothrombin Time: 12.2 seconds (ref 11.4–15.2)

## 2019-06-17 LAB — LIPASE, BLOOD: Lipase: 18 U/L (ref 11–51)

## 2019-06-17 MED ORDER — LACTATED RINGERS IV BOLUS (SEPSIS)
500.0000 mL | Freq: Once | INTRAVENOUS | Status: AC
  Administered 2019-06-17: 500 mL via INTRAVENOUS

## 2019-06-17 MED ORDER — LACTATED RINGERS IV BOLUS (SEPSIS)
1000.0000 mL | Freq: Once | INTRAVENOUS | Status: AC
  Administered 2019-06-17: 23:00:00 1000 mL via INTRAVENOUS

## 2019-06-17 MED ORDER — SODIUM CHLORIDE 0.9% FLUSH
3.0000 mL | Freq: Once | INTRAVENOUS | Status: AC
  Administered 2019-06-17: 23:00:00 3 mL via INTRAVENOUS

## 2019-06-17 MED ORDER — LACTATED RINGERS IV BOLUS (SEPSIS)
250.0000 mL | Freq: Once | INTRAVENOUS | Status: AC
  Administered 2019-06-18: 250 mL via INTRAVENOUS

## 2019-06-17 NOTE — ED Triage Notes (Signed)
Per ems: Pt coming in c/o left flank pain that started over the weekend with N/V. Last episode 1 hour ago. Unable to keep fluids down. Hx of DM.    cbg-209 126/91 118 hr 99% room air 99.0 temp

## 2019-06-18 ENCOUNTER — Encounter (HOSPITAL_COMMUNITY): Payer: Self-pay

## 2019-06-18 DIAGNOSIS — F17210 Nicotine dependence, cigarettes, uncomplicated: Secondary | ICD-10-CM | POA: Diagnosis present

## 2019-06-18 DIAGNOSIS — N10 Acute tubulo-interstitial nephritis: Secondary | ICD-10-CM | POA: Diagnosis present

## 2019-06-18 DIAGNOSIS — Z823 Family history of stroke: Secondary | ICD-10-CM | POA: Diagnosis not present

## 2019-06-18 DIAGNOSIS — R112 Nausea with vomiting, unspecified: Secondary | ICD-10-CM | POA: Diagnosis present

## 2019-06-18 DIAGNOSIS — E876 Hypokalemia: Secondary | ICD-10-CM

## 2019-06-18 DIAGNOSIS — Z9114 Patient's other noncompliance with medication regimen: Secondary | ICD-10-CM | POA: Diagnosis not present

## 2019-06-18 DIAGNOSIS — Z5329 Procedure and treatment not carried out because of patient's decision for other reasons: Secondary | ICD-10-CM | POA: Diagnosis not present

## 2019-06-18 DIAGNOSIS — Z8249 Family history of ischemic heart disease and other diseases of the circulatory system: Secondary | ICD-10-CM | POA: Diagnosis not present

## 2019-06-18 DIAGNOSIS — F1210 Cannabis abuse, uncomplicated: Secondary | ICD-10-CM | POA: Diagnosis present

## 2019-06-18 DIAGNOSIS — Z20828 Contact with and (suspected) exposure to other viral communicable diseases: Secondary | ICD-10-CM | POA: Diagnosis present

## 2019-06-18 DIAGNOSIS — Z79899 Other long term (current) drug therapy: Secondary | ICD-10-CM | POA: Diagnosis not present

## 2019-06-18 DIAGNOSIS — Z833 Family history of diabetes mellitus: Secondary | ICD-10-CM | POA: Diagnosis not present

## 2019-06-18 DIAGNOSIS — Z794 Long term (current) use of insulin: Secondary | ICD-10-CM | POA: Diagnosis not present

## 2019-06-18 DIAGNOSIS — E1010 Type 1 diabetes mellitus with ketoacidosis without coma: Secondary | ICD-10-CM | POA: Diagnosis not present

## 2019-06-18 DIAGNOSIS — Z8349 Family history of other endocrine, nutritional and metabolic diseases: Secondary | ICD-10-CM | POA: Diagnosis not present

## 2019-06-18 HISTORY — DX: Patient's other noncompliance with medication regimen: Z91.14

## 2019-06-18 LAB — COMPREHENSIVE METABOLIC PANEL
ALT: 29 U/L (ref 0–44)
AST: 21 U/L (ref 15–41)
Albumin: 4.8 g/dL (ref 3.5–5.0)
Alkaline Phosphatase: 133 U/L — ABNORMAL HIGH (ref 38–126)
Anion gap: 25 — ABNORMAL HIGH (ref 5–15)
BUN: 16 mg/dL (ref 6–20)
CO2: 21 mmol/L — ABNORMAL LOW (ref 22–32)
Calcium: 10.2 mg/dL (ref 8.9–10.3)
Chloride: 83 mmol/L — ABNORMAL LOW (ref 98–111)
Creatinine, Ser: 0.93 mg/dL (ref 0.44–1.00)
GFR calc Af Amer: >60 mL/min (ref >60)
GFR calc non Af Amer: >60 mL/min (ref >60)
Glucose, Bld: 246 mg/dL — ABNORMAL HIGH (ref 70–99)
Potassium: 2.9 mmol/L — ABNORMAL LOW (ref 3.5–5.1)
Sodium: 129 mmol/L — ABNORMAL LOW (ref 135–145)
Total Bilirubin: 1.3 mg/dL — ABNORMAL HIGH (ref 0.3–1.2)
Total Protein: 9.5 g/dL — ABNORMAL HIGH (ref 6.5–8.1)

## 2019-06-18 LAB — BASIC METABOLIC PANEL
Anion gap: 22 — ABNORMAL HIGH (ref 5–15)
Anion gap: 19 — ABNORMAL HIGH (ref 5–15)
Anion gap: 14 (ref 5–15)
Anion gap: 13 (ref 5–15)
Anion gap: 17 — ABNORMAL HIGH (ref 5–15)
Anion gap: 10 (ref 5–15)
BUN: 14 mg/dL (ref 6–20)
BUN: 13 mg/dL (ref 6–20)
BUN: 11 mg/dL (ref 6–20)
BUN: 9 mg/dL (ref 6–20)
BUN: 8 mg/dL (ref 6–20)
BUN: 6 mg/dL (ref 6–20)
CO2: 20 mmol/L — ABNORMAL LOW (ref 22–32)
CO2: 20 mmol/L — ABNORMAL LOW (ref 22–32)
CO2: 23 mmol/L (ref 22–32)
CO2: 22 mmol/L (ref 22–32)
CO2: 20 mmol/L — ABNORMAL LOW (ref 22–32)
CO2: 24 mmol/L (ref 22–32)
Calcium: 9.1 mg/dL (ref 8.9–10.3)
Calcium: 8.9 mg/dL (ref 8.9–10.3)
Calcium: 8.7 mg/dL — ABNORMAL LOW (ref 8.9–10.3)
Calcium: 8.5 mg/dL — ABNORMAL LOW (ref 8.9–10.3)
Calcium: 8.8 mg/dL — ABNORMAL LOW (ref 8.9–10.3)
Calcium: 8.6 mg/dL — ABNORMAL LOW (ref 8.9–10.3)
Chloride: 87 mmol/L — ABNORMAL LOW (ref 98–111)
Chloride: 90 mmol/L — ABNORMAL LOW (ref 98–111)
Chloride: 94 mmol/L — ABNORMAL LOW (ref 98–111)
Chloride: 95 mmol/L — ABNORMAL LOW (ref 98–111)
Chloride: 95 mmol/L — ABNORMAL LOW (ref 98–111)
Chloride: 100 mmol/L (ref 98–111)
Creatinine, Ser: 0.92 mg/dL (ref 0.44–1.00)
Creatinine, Ser: 0.92 mg/dL (ref 0.44–1.00)
Creatinine, Ser: 0.67 mg/dL (ref 0.44–1.00)
Creatinine, Ser: 0.59 mg/dL (ref 0.44–1.00)
Creatinine, Ser: 0.71 mg/dL (ref 0.44–1.00)
Creatinine, Ser: 0.63 mg/dL (ref 0.44–1.00)
GFR calc Af Amer: >60 mL/min (ref >60)
GFR calc Af Amer: >60 mL/min (ref >60)
GFR calc Af Amer: >60 mL/min (ref >60)
GFR calc Af Amer: >60 mL/min (ref >60)
GFR calc Af Amer: >60 mL/min (ref >60)
GFR calc Af Amer: >60 mL/min (ref >60)
GFR calc non Af Amer: >60 mL/min (ref >60)
GFR calc non Af Amer: >60 mL/min (ref >60)
GFR calc non Af Amer: >60 mL/min (ref >60)
GFR calc non Af Amer: >60 mL/min (ref >60)
GFR calc non Af Amer: >60 mL/min (ref >60)
GFR calc non Af Amer: >60 mL/min (ref >60)
Glucose, Bld: 300 mg/dL — ABNORMAL HIGH (ref 70–99)
Glucose, Bld: 275 mg/dL — ABNORMAL HIGH (ref 70–99)
Glucose, Bld: 162 mg/dL — ABNORMAL HIGH (ref 70–99)
Glucose, Bld: 146 mg/dL — ABNORMAL HIGH (ref 70–99)
Glucose, Bld: 171 mg/dL — ABNORMAL HIGH (ref 70–99)
Glucose, Bld: 139 mg/dL — ABNORMAL HIGH (ref 70–99)
Potassium: 3.2 mmol/L — ABNORMAL LOW (ref 3.5–5.1)
Potassium: 2.7 mmol/L — CL (ref 3.5–5.1)
Potassium: 3.5 mmol/L (ref 3.5–5.1)
Potassium: 3.5 mmol/L (ref 3.5–5.1)
Potassium: 3.8 mmol/L (ref 3.5–5.1)
Potassium: 3.5 mmol/L (ref 3.5–5.1)
Sodium: 129 mmol/L — ABNORMAL LOW (ref 135–145)
Sodium: 129 mmol/L — ABNORMAL LOW (ref 135–145)
Sodium: 131 mmol/L — ABNORMAL LOW (ref 135–145)
Sodium: 130 mmol/L — ABNORMAL LOW (ref 135–145)
Sodium: 132 mmol/L — ABNORMAL LOW (ref 135–145)
Sodium: 134 mmol/L — ABNORMAL LOW (ref 135–145)

## 2019-06-18 LAB — GLUCOSE, CAPILLARY
Glucose-Capillary: 146 mg/dL — ABNORMAL HIGH (ref 70–99)
Glucose-Capillary: 125 mg/dL — ABNORMAL HIGH (ref 70–99)
Glucose-Capillary: 236 mg/dL — ABNORMAL HIGH (ref 70–99)
Glucose-Capillary: 194 mg/dL — ABNORMAL HIGH (ref 70–99)
Glucose-Capillary: 148 mg/dL — ABNORMAL HIGH (ref 70–99)
Glucose-Capillary: 140 mg/dL — ABNORMAL HIGH (ref 70–99)
Glucose-Capillary: 157 mg/dL — ABNORMAL HIGH (ref 70–99)
Glucose-Capillary: 150 mg/dL — ABNORMAL HIGH (ref 70–99)
Glucose-Capillary: 143 mg/dL — ABNORMAL HIGH (ref 70–99)
Glucose-Capillary: 137 mg/dL — ABNORMAL HIGH (ref 70–99)
Glucose-Capillary: 162 mg/dL — ABNORMAL HIGH (ref 70–99)
Glucose-Capillary: 155 mg/dL — ABNORMAL HIGH (ref 70–99)
Glucose-Capillary: 142 mg/dL — ABNORMAL HIGH (ref 70–99)
Glucose-Capillary: 156 mg/dL — ABNORMAL HIGH (ref 70–99)
Glucose-Capillary: 131 mg/dL — ABNORMAL HIGH (ref 70–99)
Glucose-Capillary: 145 mg/dL — ABNORMAL HIGH (ref 70–99)
Glucose-Capillary: 145 mg/dL — ABNORMAL HIGH (ref 70–99)

## 2019-06-18 LAB — RAPID URINE DRUG SCREEN, HOSP PERFORMED
Amphetamines: NOT DETECTED
Barbiturates: NOT DETECTED
Benzodiazepines: NOT DETECTED
Cocaine: NOT DETECTED
Opiates: NOT DETECTED
Tetrahydrocannabinol: POSITIVE — AB

## 2019-06-18 LAB — BETA-HYDROXYBUTYRIC ACID
Beta-Hydroxybutyric Acid: 2.49 mmol/L — ABNORMAL HIGH (ref 0.05–0.27)
Beta-Hydroxybutyric Acid: 3.64 mmol/L — ABNORMAL HIGH (ref 0.05–0.27)
Beta-Hydroxybutyric Acid: 1.15 mmol/L — ABNORMAL HIGH (ref 0.05–0.27)

## 2019-06-18 LAB — URINE CULTURE

## 2019-06-18 LAB — CBG MONITORING, ED
Glucose-Capillary: 281 mg/dL — ABNORMAL HIGH (ref 70–99)
Glucose-Capillary: 321 mg/dL — ABNORMAL HIGH (ref 70–99)
Glucose-Capillary: 391 mg/dL — ABNORMAL HIGH (ref 70–99)

## 2019-06-18 LAB — HEMOGLOBIN A1C
Hgb A1c MFr Bld: 11 % — ABNORMAL HIGH (ref 4.8–5.6)
Mean Plasma Glucose: 269 mg/dL

## 2019-06-18 LAB — LACTIC ACID, PLASMA: Lactic Acid, Venous: 2.4 mmol/L (ref 0.5–1.9)

## 2019-06-18 LAB — MRSA PCR SCREENING: MRSA by PCR: NEGATIVE

## 2019-06-18 LAB — SARS CORONAVIRUS 2 (TAT 6-24 HRS): SARS Coronavirus 2: NEGATIVE

## 2019-06-18 MED ORDER — PROMETHAZINE HCL 25 MG/ML IJ SOLN
25.0000 mg | Freq: Once | INTRAMUSCULAR | Status: AC
  Administered 2019-06-18: 04:00:00 25 mg via INTRAVENOUS
  Filled 2019-06-18: qty 1

## 2019-06-18 MED ORDER — DEXTROSE 50 % IV SOLN: 0.0000 mL | INTRAVENOUS | Status: DC | PRN

## 2019-06-18 MED ORDER — ONDANSETRON HCL 4 MG/2ML IJ SOLN
4.0000 mg | Freq: Four times a day (QID) | INTRAMUSCULAR | Status: DC | PRN
  Administered 2019-06-18: 15:00:00 4 mg via INTRAVENOUS
  Filled 2019-06-18: qty 2

## 2019-06-18 MED ORDER — ENOXAPARIN SODIUM 40 MG/0.4ML ~~LOC~~ SOLN
40.0000 mg | Freq: Every day | SUBCUTANEOUS | Status: DC
  Administered 2019-06-18 – 2019-06-19 (×2): 40 mg via SUBCUTANEOUS
  Filled 2019-06-18 (×2): qty 0.4

## 2019-06-18 MED ORDER — DEXTROSE-NACL 5-0.45 % IV SOLN: INTRAVENOUS | Status: DC

## 2019-06-18 MED ORDER — SODIUM CHLORIDE 0.9 % IV SOLN
INTRAVENOUS | Status: DC
  Administered 2019-06-19: 06:00:00 via INTRAVENOUS

## 2019-06-18 MED ORDER — POTASSIUM CHLORIDE 10 MEQ/100ML IV SOLN
10.0000 meq | INTRAVENOUS | Status: AC
  Administered 2019-06-18 (×4): 10 meq via INTRAVENOUS
  Filled 2019-06-18 (×4): qty 100

## 2019-06-18 MED ORDER — POTASSIUM CHLORIDE 10 MEQ/100ML IV SOLN
10.0000 meq | INTRAVENOUS | Status: AC
  Administered 2019-06-18 (×4): 10 meq via INTRAVENOUS
  Filled 2019-06-18 (×4): qty 100

## 2019-06-18 MED ORDER — MORPHINE SULFATE (PF) 2 MG/ML IV SOLN
1.0000 mg | Freq: Once | INTRAVENOUS | Status: AC
  Administered 2019-06-18: 04:00:00 1 mg via INTRAVENOUS
  Filled 2019-06-18: qty 1

## 2019-06-18 MED ORDER — INSULIN REGULAR(HUMAN) IN NACL 100-0.9 UT/100ML-% IV SOLN
INTRAVENOUS | Status: DC
  Administered 2019-06-18: 05:00:00 1 [IU]/h via INTRAVENOUS
  Administered 2019-06-18: 06:00:00 4 [IU]/h via INTRAVENOUS

## 2019-06-18 MED ORDER — INSULIN REGULAR(HUMAN) IN NACL 100-0.9 UT/100ML-% IV SOLN
INTRAVENOUS | Status: DC
  Administered 2019-06-18: 01:00:00 8 [IU]/h via INTRAVENOUS
  Filled 2019-06-18: qty 100

## 2019-06-18 MED ORDER — CHLORHEXIDINE GLUCONATE CLOTH 2 % EX PADS
6.0000 | MEDICATED_PAD | Freq: Every day | CUTANEOUS | Status: DC
  Administered 2019-06-18 – 2019-06-19 (×3): 6 via TOPICAL

## 2019-06-18 MED ORDER — SODIUM CHLORIDE 0.9 % IV SOLN
1.0000 g | INTRAVENOUS | Status: DC
  Administered 2019-06-18 – 2019-06-19 (×2): 1 g via INTRAVENOUS
  Filled 2019-06-18: qty 1
  Filled 2019-06-18: qty 10

## 2019-06-18 MED ORDER — DEXTROSE-NACL 5-0.45 % IV SOLN
INTRAVENOUS | Status: DC
  Administered 2019-06-18: 04:00:00 via INTRAVENOUS

## 2019-06-18 MED ORDER — OXYCODONE-ACETAMINOPHEN 5-325 MG PO TABS
1.0000 | ORAL_TABLET | Freq: Once | ORAL | Status: AC
  Administered 2019-06-18: 1 via ORAL
  Filled 2019-06-18: qty 1

## 2019-06-18 NOTE — H&P (Signed)
History and Physical   Sabrina Sutton EUM:[id number] DOB: 1995/09/19 DOA: 06/17/2019  Referring MD/NP/PA: Dr. Kathrynn Humble  PCP: Rory Percy, DO   Outpatient Specialists: None  Patient coming from: Home  Chief Complaint: Vomiting and flank pain  HPI: Sabrina Sutton is a 23 y.o. female with medical history significant of type 1 diabetes who has not been compliant with her medications, recurrent pyelonephritis, history of HSV infection, marijuana abuse who presented with left-sided flank pain, fever and chills for the last 3 days.  Patient has not been taking her insulin consistently.  She has had recurrent hospitalization and complications from her diabetes.  She has some dysuria.  She was seen in the ER with evidence of pyelonephritis left CVA tenderness and urinalysis consistent with pyelo-.  Also has significant gap of 25 with ketonuria.  Also hypokalemia with potassium 2.9.  Patient being admitted with DKA as well as acute pyelonephritis due to medication noncompliance..  ED Course: Temperature is 98.5 blood pressure 127/95, pulse 135 respirate 21 oxygen sats 99% room air.  White count is 12.8 hemoglobin 16.7 platelets 484.  Sodium 129 potassium 2.9 chloride 83 CO2 21 BUN 16 creatinine 0.9 calcium 10.2 lactic acid 2.0 glucose 246.  Anion gap of 25.  Urinalysis showed cloudy urine with large leukocytes WBC more than 50 RBC 21-50 with rare bacteria.  Pregnancy test is negative.  PT 12.2 INR 0.9.  Urine drug screen is positive for THC.  Patient initiated on IV fluids and IV insulin and is being admitted for treatment.  Review of Systems: As per HPI otherwise 10 point review of systems negative.    Past Medical History:  Diagnosis Date  . Diabetes mellitus without complication ([place]) 5/40/08   + GAD Ab  . DKA (diabetic ketoacidoses) ([place]) 09/20/2015  . Hypokalemia   . Pyelonephritis 04/17/2016    Past Surgical History:  Procedure Laterality Date  . NO PAST SURGERIES       reports that she has been smoking cigarettes. She has been smoking about 1.00 pack per day. She has never used smokeless tobacco. She reports current alcohol use. She reports current drug use. Drug: Marijuana.  No Known Allergies  Family History  Problem Relation Age of Onset  . Diabetes Maternal Grandmother   . Heart disease Maternal Grandmother        Deceased from MI at age 26  . Hypertension Maternal Grandmother   . Hypercholesterolemia Mother   . Seizures Mother   . Kidney Stones Mother   . Hyperlipidemia Mother   . Stroke Maternal Grandfather        Deceased from stroke at age 56  . Hypertension Paternal Grandmother   . Healthy Father      Prior to Admission medications   Medication Sig Start Date End Date Taking? Authorizing Provider  glucagon 1 MG injection Inject 11m IM if unconscious, seizing, or unable to eat to correct low blood sugar 02/22/19  Yes KWilber Oliphant MD  insulin glargine (LANTUS) 100 UNIT/ML injection Inject 26 Units into the skin daily.   Yes [provider]  NOVOLOG FLEXPEN 100 UNIT/ML FlexPen Inject 10 Units into the skin 3 (three) times daily. 06/03/19  Yes [provider]  ACCU-CHEK AVIVA PLUS test strip USE AS DIRECTED 06/18/18   RSteve Rattler DO  Accu-Chek FastClix Lancets MISC Check sugar 10 x daily 02/22/19   KWilber Oliphant MD  Alcohol Swabs (ALCOHOL PADS) 70 % PADS Use to wipe skin prior to insulin injections twice  daily 02/22/19   Wilber Oliphant, MD  blood glucose meter kit and supplies Dispense based on patient and insurance preference. Use up to four times daily as directed. (FOR ICD-10 E10.9, E11.9). 02/22/19   Wilber Oliphant, MD  glucose blood (ACCU-CHEK AVIVA PLUS) test strip Please check blood sugars 4 times a day 03/04/19   Rory Percy, DO  Insulin Pen Needle (B-D UF III MINI PEN NEEDLES) 31G X 5 MM MISC USE TO CHECK BLOOD SUGAR IN THE MORNING BEFORE EATING, BEFORE EACH MEAL, AND AS NEEDED 02/22/19   Wilber Oliphant, MD  Insulin  Pen Needle (B-D UF III MINI PEN NEEDLES) 31G X 5 MM MISC CHECK BLOOD SUGAR IN THE MORNING BEFORE EATING, BEFORE EACH MEAL, AND AS NEEDED 02/22/19   Wilber Oliphant, MD  Lancets (ACCU-CHEK SOFT TOUCH) lancets Use as directed. 10/24/17   Kathrene Alu, MD  metoCLOPramide (REGLAN) 5 MG tablet Take 1 tablet (5 mg total) by mouth 4 (four) times daily -  before meals and at bedtime. Patient not taking: Reported on 04/11/2019 02/22/19   Wilber Oliphant, MD  norgestimate-ethinyl estradiol (ORTHO-CYCLEN) 0.25-35 MG-MCG tablet Take 1 tablet by mouth daily. Patient not taking: Reported on 04/11/2019 03/04/19   Rory Percy, DO  ondansetron (ZOFRAN-ODT) 4 MG disintegrating tablet Take 1 tablet (4 mg total) by mouth every 6 (six) hours as needed for nausea or vomiting. Patient not taking: Reported on 04/11/2019 11/16/18   Dessa Phi, DO  Prenatal Vit-Fe Fumarate-FA (PRENATAL VITAMIN) 27-0.8 MG TABS Take 1 tablet by mouth daily. Patient not taking: Reported on 04/11/2019 03/04/19   Rory Percy, DO  promethazine (PHENERGAN) 12.5 MG tablet Take 1 tablet (12.5 mg total) by mouth every 6 (six) hours as needed for nausea or vomiting. Patient not taking: Reported on 04/11/2019 11/16/18   Dessa Phi, DO    Physical Exam: Vitals:   06/17/19 2152 06/17/19 2252 06/17/19 2330 06/18/19 0000  BP:  (!) 125/100 (!) 125/91 (!) 127/95  Pulse:  (!) 104 92 91  Resp:  18 (!) 21 15  Temp:      TempSrc:      SpO2: 99% 98% 96% 97%  Weight:      Height:          Constitutional: calm, no distress Vitals:   06/17/19 2152 06/17/19 2252 06/17/19 2330 06/18/19 0000  BP:  (!) 125/100 (!) 125/91 (!) 127/95  Pulse:  (!) 104 92 91  Resp:  18 (!) 21 15  Temp:      TempSrc:      SpO2: 99% 98% 96% 97%  Weight:      Height:       Eyes: PERRL, lids and conjunctivae normal ENMT: Mucous membranes are dry. Posterior pharynx clear of any exudate or lesions.Normal dentition.  Neck: normal, supple, no masses, no thyromegaly  Respiratory: clear to auscultation bilaterally, no wheezing, no crackles. Normal respiratory effort. No accessory muscle use.  Cardiovascular: Tachycardic, no murmurs / rubs / gallops. No extremity edema. 2+ pedal pulses. No carotid bruits.  Abdomen: no tenderness, no masses palpated. No hepatosplenomegaly. Bowel sounds positive.  Positive left CVA tenderness Musculoskeletal: no clubbing / cyanosis. No joint deformity upper and lower extremities. Good ROM, no contractures. Normal muscle tone.  Skin: no rashes, lesions, ulcers. No induration Neurologic: CN 2-12 grossly intact. Sensation intact, DTR normal. Strength 5/5 in all 4.  Psychiatric: Normal judgment and insight. Alert and oriented x 3. Normal mood.     Labs  on Admission: I have personally reviewed following labs and imaging studies  CBC: Recent Labs  Lab 06/17/19 2248  WBC 12.8*  HGB 16.7*  HCT 47.8*  MCV 83.6  PLT 349*   Basic Metabolic Panel: Recent Labs  Lab 06/17/19 2248  NA 129*  K 2.9*  CL 83*  CO2 21*  GLUCOSE 246*  BUN 16  CREATININE 0.93  CALCIUM 10.2   GFR: Estimated Creatinine Clearance: 82.2 mL/min (by C-G formula based on SCr of 0.93 mg/dL). Liver Function Tests: Recent Labs  Lab 06/17/19 2248  AST 21  ALT 29  ALKPHOS 133*  BILITOT 1.3*  PROT 9.5*  ALBUMIN 4.8   Recent Labs  Lab 06/17/19 2248  LIPASE 18   No results for input(s): AMMONIA in the last 168 hours. Coagulation Profile: Recent Labs  Lab 06/17/19 2248  INR 0.9   Cardiac Enzymes: No results for input(s): CKTOTAL, CKMB, CKMBINDEX, TROPONINI in the last 168 hours. BNP (last 3 results) No results for input(s): PROBNP in the last 8760 hours. HbA1C: No results for input(s): HGBA1C in the last 72 hours. CBG: Recent Labs  Lab 06/17/19 2202  GLUCAP 216*   Lipid Profile: No results for input(s): CHOL, HDL, LDLCALC, TRIG, CHOLHDL, LDLDIRECT in the last 72 hours. Thyroid Function Tests: No results for input(s): TSH,  T4TOTAL, FREET4, T3FREE, THYROIDAB in the last 72 hours. Anemia Panel: No results for input(s): VITAMINB12, FOLATE, FERRITIN, TIBC, IRON, RETICCTPCT in the last 72 hours. Urine analysis:    Component Value Date/Time   COLORURINE YELLOW 06/17/2019 2328   APPEARANCEUR CLOUDY (A) 06/17/2019 2328   LABSPEC 1.023 06/17/2019 2328   PHURINE 5.0 06/17/2019 2328   GLUCOSEU 50 (A) 06/17/2019 2328   HGBUR NEGATIVE 06/17/2019 2328   BILIRUBINUR NEGATIVE 06/17/2019 2328   KETONESUR 80 (A) 06/17/2019 2328   PROTEINUR 100 (A) 06/17/2019 2328   UROBILINOGEN 1.0 08/21/2014 1549   NITRITE NEGATIVE 06/17/2019 2328   LEUKOCYTESUR LARGE (A) 06/17/2019 2328   Sepsis Labs: _0 (procalcitonin:4,lacticidven:4) )No results found for this or any previous visit (from the past 240 hour(s)).   Radiological Exams on Admission: No results found.    Assessment/Plan Principal Problem:   DKA (diabetic ketoacidoses) (HCC) Active Problems:   Acute pyelonephritis   Hypokalemia   Noncompliance with medication regimen     #1 DKA: Patient has ketonuria of 80.  CO2 is compensated however.  She has an anion gap of 25.  We will admit her with DKA and follow the protocol with IV insulin.  Serial BMPs and capillary blood glucose checks hourly.  Adjust insulin.  Change fluids to D5 half-normal at the right time.  Counseled patient on medication management after discharge.  #2 acute pyelonephritis: Patient has left CVA tenderness.  Initiate IV Rocephin while waiting for urine and blood culture results.  #3 marijuana abuse: Counseling provided.  #4 hypokalemia: Replete potassium appropriately.  #5 medication noncompliance: Medication use counseling again reinforced to the patient.   DVT prophylaxis: Lovenox Code Status: Full code Family Communication: No family at bedside Disposition Plan: Home Consults called: None Admission status: Inpatient  Severity of Illness: The appropriate patient status for  this patient is INPATIENT. Inpatient status is judged to be reasonable and necessary in order to provide the required intensity of service to ensure the patient's safety. The patient's presenting symptoms, physical exam findings, and initial radiographic and laboratory data in the context of their chronic comorbidities is felt to place them at high risk for further clinical deterioration. Furthermore,  it is not anticipated that the patient will be medically stable for discharge from the hospital within 2 midnights of admission. The following factors support the patient status of inpatient.   " The patient's presenting symptoms include left flank pain and fever. " The worrisome physical exam findings include anion gap and evidence of UTI. " The initial radiographic and laboratory data are worrisome because of evidence of DKA. " The chronic co-morbidities include type 1 diabetes.   * I certify that at the point of admission it is my clinical judgment that the patient will require inpatient hospital care spanning beyond 2 midnights from the point of admission due to high intensity of service, high risk for further deterioration and high frequency of surveillance required.Barbette Merino MD [facility] Pager [phone]  If 7PM-7AM, please contact night-coverage [email or web address] Password [facility]  06/18/2019, 12:39 AM

## 2019-06-18 NOTE — Progress Notes (Signed)
Inpatient Diabetes Program Recommendations  AACE/ADA: New Consensus Statement on Inpatient Glycemic Control (2015)  Target Ranges:  Prepandial:   less than 140 mg/dL      Peak postprandial:   less than 180 mg/dL (1-2 hours)      Critically ill patients:  140 - 180 mg/dL   Lab Results  Component Value Date   GLUCAP 150 (H) 06/18/2019   HGBA1C 11.0 (H) 06/18/2019    Review of Glycemic Control  Diabetes history: DM type 1 diagnosed in 2017 Outpatient Diabetes medications: Lantus 26 units Daily, Novolog 10 units tid meal coverage Current orders for Inpatient glycemic control: IV insulin gtt  Inpatient Diabetes Program Recommendations:    Spoke with patient at bedside regarding her DKA admission. Pt reports she got depressed and wasn't taking her medications like she should. Pt also reports that she is still trying to get use to her diagnosis and form a routine.  Discussed support systems. Pt thinks she has a good support system that she can call when she is depressed. Pt has plans to see a counselor to help keep her on track. Discussed importance of glucose control.  Patient needs new Glucose meter at time of d/c.  Thanks,  Tama Headings RN, MSN, BC-ADM Inpatient Diabetes Coordinator Team Pager [phone] (8a-5p)

## 2019-06-18 NOTE — Plan of Care (Signed)
23 year old female with type 1 diabetes history of marijuana abuse admitted with fever chills and left flank pain.  She is in DKA with high gap acidosis on IV insulin.  She is awake and alert asking for something to drink.  Last chemistry I have sodium is 130 potassium 3.5 BUN is 9 creatinine is 0.59 beta hydroxybutyrate is still 2.49 her hemoglobin A1c was 11.0.  Urine drug screen positive for THC.  UA with large amount of leukocytes WBC more than 50.  Follow-up urine culture.  Will try clear liquids.

## 2019-06-18 NOTE — ED Provider Notes (Signed)
[facility] DEPT Provider Note   CSN: [id number] Arrival date & time: 06/17/19  2145     History   Chief Complaint Chief Complaint  Patient presents with  . Flank Pain  . Nausea    HPI Sabrina Sutton is a 23 y.o. female.     HPI  23 year old female comes with chief complaint of nausea, vomiting and flank pain.  She has history of type 1 diabetes, pyelonephritis.  She reports that she started having left-sided flank pain 3 days ago.  2 days ago she started having vomiting and she is having emesis x5 that is bilious.  She denies any UTI-like symptoms.  She has no vaginal discharge or bleeding. No hx of kidney stones.  Past Medical History:  Diagnosis Date  . Diabetes mellitus without complication ([place]) 4/92/01   + GAD Ab  . DKA (diabetic ketoacidoses) ([place]) 09/20/2015  . Hypokalemia   . Pyelonephritis 04/17/2016    Patient Active Problem List   Diagnosis Date Noted  . Diarrhea 04/11/2019  . Contraception management 03/04/2019  . RUQ pain   . DKA (diabetic ketoacidoses) ([place]) 02/20/2019  . Renal mass 08/21/2018  . Near syncope 05/03/2018  . Condyloma acuminatum of vulva 10/31/2017  . 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 Frequent No-show for appointment 11/03/2016  . History of pyelonephritis 04/17/2016  . Type 1 diabetes mellitus with complications ([place]) 00/71/2197    Past Surgical History:  Procedure Laterality Date  . NO PAST SURGERIES       OB History   No obstetric history on file.      Home Medications    Prior to Admission medications   Medication Sig Start Date End Date Taking? Authorizing Provider  glucagon 1 MG injection Inject 63m IM if unconscious, seizing, or unable to eat to correct low blood sugar 02/22/19  Yes KWilber Oliphant MD  insulin glargine (LANTUS) 100 UNIT/ML injection Inject 26 Units into the skin daily.   Yes [provider]  NOVOLOG FLEXPEN 100 UNIT/ML FlexPen Inject 10 Units into the skin 3  (three) times daily. 06/03/19  Yes [provider]  ACCU-CHEK AVIVA PLUS test strip USE AS DIRECTED 06/18/18   RSteve Rattler DO  Accu-Chek FastClix Lancets MISC Check sugar 10 x daily 02/22/19   KWilber Oliphant MD  Alcohol Swabs (ALCOHOL PADS) 70 % PADS Use to wipe skin prior to insulin injections twice daily 02/22/19   KWilber Oliphant MD  blood glucose meter kit and supplies Dispense based on patient and insurance preference. Use up to four times daily as directed. (FOR ICD-10 E10.9, E11.9). 02/22/19   KWilber Oliphant MD  glucose blood (ACCU-CHEK AVIVA PLUS) test strip Please check blood sugars 4 times a day 03/04/19   RRory Percy DO  Insulin Pen Needle (B-D UF III MINI PEN NEEDLES) 31G X 5 MM MISC USE TO CHECK BLOOD SUGAR IN THE MORNING BEFORE EATING, BEFORE EACH MEAL, AND AS NEEDED 02/22/19   KWilber Oliphant MD  Insulin Pen Needle (B-D UF III MINI PEN NEEDLES) 31G X 5 MM MISC CHECK BLOOD SUGAR IN THE MORNING BEFORE EATING, BEFORE EACH MEAL, AND AS NEEDED 02/22/19   KWilber Oliphant MD  Lancets (ACCU-CHEK SOFT TOUCH) lancets Use as directed. 10/24/17   WKathrene Alu MD  metoCLOPramide (REGLAN) 5 MG tablet Take 1 tablet (5 mg total) by mouth 4 (four) times daily -  before meals and at bedtime. Patient not taking: Reported on 04/11/2019 02/22/19  Wilber Oliphant, MD  norgestimate-ethinyl estradiol (ORTHO-CYCLEN) 0.25-35 MG-MCG tablet Take 1 tablet by mouth daily. Patient not taking: Reported on 04/11/2019 03/04/19   Rory Percy, DO  ondansetron (ZOFRAN-ODT) 4 MG disintegrating tablet Take 1 tablet (4 mg total) by mouth every 6 (six) hours as needed for nausea or vomiting. Patient not taking: Reported on 04/11/2019 11/16/18   Dessa Phi, DO  Prenatal Vit-Fe Fumarate-FA (PRENATAL VITAMIN) 27-0.8 MG TABS Take 1 tablet by mouth daily. Patient not taking: Reported on 04/11/2019 03/04/19   Rory Percy, DO  promethazine (PHENERGAN) 12.5 MG tablet Take 1 tablet (12.5 mg total) by mouth every 6 (six)  hours as needed for nausea or vomiting. Patient not taking: Reported on 04/11/2019 11/16/18   Dessa Phi, DO    Family History Family History  Problem Relation Age of Onset  . Diabetes Maternal Grandmother   . Heart disease Maternal Grandmother        Deceased from MI at age 18  . Hypertension Maternal Grandmother   . Hypercholesterolemia Mother   . Seizures Mother   . Kidney Stones Mother   . Hyperlipidemia Mother   . Stroke Maternal Grandfather        Deceased from stroke at age 27  . Hypertension Paternal Grandmother   . Healthy Father     Social History Social History   Tobacco Use  . Smoking status: Current Every Day Smoker    Packs/day: 1.00    Types: Cigarettes  . Smokeless tobacco: Never Used  Substance Use Topics  . Alcohol use: Yes    Comment: Last drank 1 month ago per MD note  . Drug use: Yes    Types: Marijuana    Comment: Last used 1  month ago per MD note     Allergies   Patient has no known allergies.   Review of Systems Review of Systems  Constitutional: Positive for activity change.  Respiratory: Negative for shortness of breath.   Cardiovascular: Negative for chest pain.  Gastrointestinal: Positive for nausea and vomiting.  Genitourinary: Positive for flank pain.  All other systems reviewed and are negative.    Physical Exam Updated Vital Signs BP (!) 127/95   Pulse 91   Temp 98.5 F (36.9 C) (Oral)   Resp 15   Ht 5' 7" (1.702 m)   Wt 54.4 kg   SpO2 97%   BMI 18.79 kg/m   Physical Exam Vitals signs and nursing note reviewed.  Constitutional:      Appearance: She is well-developed.  HENT:     Head: Normocephalic and atraumatic.  Eyes:     Pupils: Pupils are equal, round, and reactive to light.  Neck:     Musculoskeletal: Neck supple.  Cardiovascular:     Rate and Rhythm: Regular rhythm. Tachycardia present.     Heart sounds: Normal heart sounds.  Pulmonary:     Effort: Pulmonary effort is normal. No respiratory  distress.  Abdominal:     General: There is no distension.     Palpations: Abdomen is soft.     Tenderness: There is no abdominal tenderness. There is no guarding or rebound.  Skin:    General: Skin is warm and dry.  Neurological:     Mental Status: She is alert and oriented to person, place, and time.      ED Treatments / Results  Labs (all labs ordered are listed, but only abnormal results are displayed) Labs Reviewed  COMPREHENSIVE METABOLIC PANEL - Abnormal; Notable for  the following components:      Result Value   Sodium 129 (*)    Potassium 2.9 (*)    Chloride 83 (*)    CO2 21 (*)    Glucose, Bld 246 (*)    Total Protein 9.5 (*)    Alkaline Phosphatase 133 (*)    Total Bilirubin 1.3 (*)    Anion gap 25 (*)    All other components within normal limits  CBC - Abnormal; Notable for the following components:   WBC 12.8 (*)    RBC 5.72 (*)    Hemoglobin 16.7 (*)    HCT 47.8 (*)    Platelets 484 (*)    All other components within normal limits  URINALYSIS, ROUTINE W REFLEX MICROSCOPIC - Abnormal; Notable for the following components:   APPearance CLOUDY (*)    Glucose, UA 50 (*)    Ketones, ur 80 (*)    Protein, ur 100 (*)    Leukocytes,Ua LARGE (*)    WBC, UA >50 (*)    Bacteria, UA RARE (*)    All other components within normal limits  LACTIC ACID, PLASMA - Abnormal; Notable for the following components:   Lactic Acid, Venous 2.1 (*)    All other components within normal limits  RAPID URINE DRUG SCREEN, HOSP PERFORMED - Abnormal; Notable for the following components:   Tetrahydrocannabinol POSITIVE (*)    All other components within normal limits  CBG MONITORING, ED - Abnormal; Notable for the following components:   Glucose-Capillary 216 (*)    All other components within normal limits  CULTURE, BLOOD (ROUTINE X 2)  CULTURE, BLOOD (ROUTINE X 2)  URINE CULTURE  SARS CORONAVIRUS 2 (TAT 6-24 HRS)  LIPASE, BLOOD  APTT  PROTIME-INR  LACTIC ACID, PLASMA   I-STAT BETA HCG BLOOD, ED ([facility], [facility], [facility] ONLY)    EKG EKG Interpretation  Date/Time:  Tuesday June 17 2019 21:59:06 EST Ventricular Rate:  133 PR Interval:    QRS Duration: 64 QT Interval:  325 QTC Calculation: 484 R Axis:   81 Text Interpretation: Sinus tachycardia Atrial premature complex Consider right atrial enlargement Minimal ST depression, diffuse leads Borderline prolonged QT interval No acute changes Confirmed by Varney Biles [phone]) on 06/17/2019 10:46:41 PM   Radiology No results found.  Procedures .Critical Care Performed by: Varney Biles, MD Authorized by: Varney Biles, MD   Critical care provider statement:    Critical care time (minutes):  36   Critical care was necessary to treat or prevent imminent or life-threatening deterioration of the following conditions:  Circulatory failure   Critical care was time spent personally by me on the following activities:  Discussions with consultants, evaluation of patient's response to treatment, examination of patient, ordering and performing treatments and interventions, ordering and review of laboratory studies, ordering and review of radiographic studies, pulse oximetry, re-evaluation of patient's condition, obtaining history from patient or surrogate and review of old charts   (including critical care time)  Medications Ordered in ED Medications  cefTRIAXone (ROCEPHIN) 1 g in sodium chloride 0.9 % 100 mL IVPB (1 g Intravenous New Bag/Given 06/18/19 0022)  insulin regular, human (MYXREDLIN) 100 units/ 100 mL infusion (has no administration in time range)  dextrose 5 %-0.45 % sodium chloride infusion (has no administration in time range)  dextrose 50 % solution 0-50 mL (has no administration in time range)  potassium chloride 10 mEq in 100 mL IVPB (has no administration in time range)  sodium chloride flush (NS)  0.9 % injection 3 mL (3 mLs Intravenous Given 06/17/19 2252)  lactated ringers bolus 1,000 mL (0 mLs  Intravenous Stopped 06/18/19 0009)    And  lactated ringers bolus 500 mL (0 mLs Intravenous Stopped 06/18/19 0009)    And  lactated ringers bolus 250 mL (250 mLs Intravenous New Bag/Given 06/18/19 0011)  oxyCODONE-acetaminophen (PERCOCET/ROXICET) 5-325 MG per tablet 1 tablet (1 tablet Oral Given 06/18/19 0018)     Initial Impression / Assessment and Plan / ED Course  I have reviewed the triage vital signs and the nursing notes.  Pertinent labs & imaging results that were available during my care of the patient were reviewed by me and considered in my medical decision making (see chart for details).  Clinical Course as of Jun 17 22  Wed Jun 18, 2019  0016 Heart rate is improved from 1 35-90.   Pulse Rate: 91 [AN]  0021 Labs show ketonuria along with anion gap of 25.  Patient's potassium is 2.9, which is likely from the vomiting.  She will need admission for optimization of her electrolyte abnormality and DKA treatment.  It appears that likely her ketonuria is from dehydration and not DKA. Anyways, will start insulin drip with D5 maintenance fluid, until the gap is closed.   [AN]  0023 Pyelonephritis treatment started.  Leukocytes,Ua(!): LARGE [AN]    Clinical Course User Index [AN] Varney Biles, MD       23 year old comes with chief complaint of flank pain. She has history of type 1 diabetes.   Differential diagnosis includes DKA, sepsis, pyelonephritis. Patient does not appear toxic therefore suspicion for perinephric abscess is low.  Labs ordered.   Final Clinical Impressions(s) / ED Diagnoses   Final diagnoses:  Acute pyelonephritis  Type 1 diabetes mellitus with ketoacidosis without coma [facility])    ED Discharge Orders    None       Varney Biles, MD 06/18/19 0023

## 2019-06-19 LAB — GLUCOSE, CAPILLARY
Glucose-Capillary: 118 mg/dL — ABNORMAL HIGH (ref 70–99)
Glucose-Capillary: 125 mg/dL — ABNORMAL HIGH (ref 70–99)
Glucose-Capillary: 126 mg/dL — ABNORMAL HIGH (ref 70–99)
Glucose-Capillary: 126 mg/dL — ABNORMAL HIGH (ref 70–99)
Glucose-Capillary: 149 mg/dL — ABNORMAL HIGH (ref 70–99)
Glucose-Capillary: 150 mg/dL — ABNORMAL HIGH (ref 70–99)
Glucose-Capillary: 151 mg/dL — ABNORMAL HIGH (ref 70–99)
Glucose-Capillary: 155 mg/dL — ABNORMAL HIGH (ref 70–99)
Glucose-Capillary: 156 mg/dL — ABNORMAL HIGH (ref 70–99)
Glucose-Capillary: 161 mg/dL — ABNORMAL HIGH (ref 70–99)
Glucose-Capillary: 162 mg/dL — ABNORMAL HIGH (ref 70–99)
Glucose-Capillary: 174 mg/dL — ABNORMAL HIGH (ref 70–99)
Glucose-Capillary: 176 mg/dL — ABNORMAL HIGH (ref 70–99)
Glucose-Capillary: 184 mg/dL — ABNORMAL HIGH (ref 70–99)
Glucose-Capillary: 185 mg/dL — ABNORMAL HIGH (ref 70–99)
Glucose-Capillary: 226 mg/dL — ABNORMAL HIGH (ref 70–99)

## 2019-06-19 LAB — BASIC METABOLIC PANEL
Anion gap: 10 (ref 5–15)
Anion gap: 11 (ref 5–15)
BUN: <5 mg/dL — ABNORMAL LOW (ref 6–20)
BUN: <5 mg/dL — ABNORMAL LOW (ref 6–20)
CO2: 25 mmol/L (ref 22–32)
CO2: 23 mmol/L (ref 22–32)
Calcium: 8.7 mg/dL — ABNORMAL LOW (ref 8.9–10.3)
Calcium: 8.4 mg/dL — ABNORMAL LOW (ref 8.9–10.3)
Chloride: 101 mmol/L (ref 98–111)
Chloride: 101 mmol/L (ref 98–111)
Creatinine, Ser: 0.65 mg/dL (ref 0.44–1.00)
Creatinine, Ser: 0.72 mg/dL (ref 0.44–1.00)
GFR calc Af Amer: >60 mL/min (ref >60)
GFR calc Af Amer: >60 mL/min (ref >60)
GFR calc non Af Amer: >60 mL/min (ref >60)
GFR calc non Af Amer: >60 mL/min (ref >60)
Glucose, Bld: 136 mg/dL — ABNORMAL HIGH (ref 70–99)
Glucose, Bld: 176 mg/dL — ABNORMAL HIGH (ref 70–99)
Potassium: 3 mmol/L — ABNORMAL LOW (ref 3.5–5.1)
Potassium: 3.9 mmol/L (ref 3.5–5.1)
Sodium: 136 mmol/L (ref 135–145)
Sodium: 135 mmol/L (ref 135–145)

## 2019-06-19 LAB — BETA-HYDROXYBUTYRIC ACID: Beta-Hydroxybutyric Acid: 0.64 mmol/L — ABNORMAL HIGH (ref 0.05–0.27)

## 2019-06-19 MED ORDER — POTASSIUM CHLORIDE 10 MEQ/100ML IV SOLN
10.0000 meq | INTRAVENOUS | Status: AC
  Administered 2019-06-19 (×5): 10 meq via INTRAVENOUS
  Filled 2019-06-19 (×5): qty 100

## 2019-06-19 MED ORDER — INSULIN ASPART 100 UNIT/ML ~~LOC~~ SOLN: 0.0000 [IU] | Freq: Three times a day (TID) | SUBCUTANEOUS | Status: DC

## 2019-06-19 MED ORDER — INSULIN GLARGINE 100 UNIT/ML ~~LOC~~ SOLN
15.0000 [IU] | Freq: Every day | SUBCUTANEOUS | Status: DC
  Administered 2019-06-19: 15 [IU] via SUBCUTANEOUS
  Filled 2019-06-19: qty 0.15

## 2019-06-19 NOTE — Progress Notes (Addendum)
PROGRESS NOTE    Sabrina Sutton  [id number] DOB: 27-Oct-1995 DOA: 06/17/2019 PCP: Rory Percy, DO    Brief Narrative:23 y.o. female with medical history significant of type 1 diabetes who has not been compliant with her medications, recurrent pyelonephritis, history of HSV infection, marijuana abuse who presented with left-sided flank pain, fever and chills for the last 3 days.  Patient has not been taking her insulin consistently.  She has had recurrent hospitalization and complications from her diabetes.  She has some dysuria.  She was seen in the ER with evidence of pyelonephritis left CVA tenderness and urinalysis consistent with pyelo-.  Also has significant gap of 25 with ketonuria.  Also hypokalemia with potassium 2.9.  Patient being admitted with DKA as well as acute pyelonephritis due to medication noncompliance..  ED Course: Temperature is 98.5 blood pressure 127/95, pulse 135 respirate 21 oxygen sats 99% room air.  White count is 12.8 hemoglobin 16.7 platelets 484.  Sodium 129 potassium 2.9 chloride 83 CO2 21 BUN 16 creatinine 0.9 calcium 10.2 lactic acid 2.0 glucose 246.  Anion gap of 25.  Urinalysis showed cloudy urine with large leukocytes WBC more than 50 RBC 21-50 with rare bacteria.  Pregnancy test is negative.  PT 12.2 INR 0.9.  Urine drug screen is positive for THC.  Patient initiated on IV fluids and IV insulin and is being admitted for treatment.  Assessment & Plan:   Principal Problem:   DKA (diabetic ketoacidoses) ([place]) Active Problems:   Acute pyelonephritis   Hypokalemia   Noncompliance with medication regimen    #1 DKA patient went into DKA due to noncompliance.  She is being treated with IV insulin.  Her gap is closed however her beta hydroxybutyrate was elevated and is pending for today.  Recheck BMP and beta hydroxybutyrate today and DCures no growth so far. drip if beta hydroxybutyrate is normal.  Start long-acting insulin prior to DC the drip.   Continue IV fluids for now.  Patient feels she is ready to eat something and has denied any nausea vomiting.  Beta hydroxybutyrate pending.  #2 hypokalemia being repleted.  Potassium 3.0.  Recheck labs in a.m.  #3 question UTI urine culture did not grow anything it showed multiple species DC antibiotics.  Blood cult  #4 polysubstance abuse urine drug screen positive for THC.   Estimated body mass index is 18.79 kg/m as calculated from the following:   Height as of this encounter: 5\' 7"  (1.702 m).   Weight as of this encounter: 54.4 kg.  DVT prophylaxis: Lovenox  code Status: Full code Family Communication: None Disposition Plan: Pending clinical improvement   Consultants:   None  Procedures: None Antimicrobials: Rocephin stopped 06/19/2019 Subjective: Resting in bed denies nausea vomiting eager to try something to drink  Objective: Vitals:   06/19/19 0350 06/19/19 0400 06/19/19 0700 06/19/19 0800  BP:  108/61  123/74  Pulse:  87  81  Resp:  14  15  Temp: 98.5 F (36.9 C)  98.7 F (37.1 C)   TempSrc: Oral  Oral   SpO2:  100%  99%  Weight:      Height:        Intake/Output Summary (Last 24 hours) at 06/19/2019 1139 Last data filed at 06/19/2019 1135 Gross per 24 hour  Intake 2408.43 ml  Output --  Net 2408.43 ml   Filed Weights   06/17/19 2151  Weight: 54.4 kg    Examination:  General exam: Appears calm and comfortable  Respiratory  system: Clear to auscultation. Respiratory effort normal. Cardiovascular system: S1 & S2 heard, RRR. No JVD, murmurs, rubs, gallops or clicks. No pedal edema. Gastrointestinal system: Abdomen is nondistended, soft and nontender. No organomegaly or masses felt. Normal bowel sounds heard. Central nervous system: Alert and oriented. No focal neurological deficits. Extremities: Symmetric 5 x 5 power. Skin: No rashes, lesions or ulcers Psychiatry: Judgement and insight appear normal. Mood & affect appropriate.     Data  Reviewed: I have personally reviewed following labs and imaging studies  CBC: Recent Labs  Lab 06/17/19 2248  WBC 12.8*  HGB 16.7*  HCT 47.8*  MCV 83.6  PLT 123456*   Basic Metabolic Panel: Recent Labs  Lab 06/18/19 0756 06/18/19 1130 06/18/19 1525 06/18/19 1922 06/19/19 0807  NA 131* 130* 132* 134* 136  K 3.5 3.5 3.8 3.5 3.0*  CL 94* 95* 95* 100 101  CO2 23 22 20* 24 25  GLUCOSE 162* 146* 171* 139* 136*  BUN 11 9 8 6  <5*  CREATININE 0.67 0.59 0.71 0.63 0.65  CALCIUM 8.7* 8.5* 8.8* 8.6* 8.7*   GFR: Estimated Creatinine Clearance: 95.5 mL/min (by C-G formula based on SCr of 0.65 mg/dL). Liver Function Tests: Recent Labs  Lab 06/17/19 2248  AST 21  ALT 29  ALKPHOS 133*  BILITOT 1.3*  PROT 9.5*  ALBUMIN 4.8   Recent Labs  Lab 06/17/19 2248  LIPASE 18   No results for input(s): AMMONIA in the last 168 hours. Coagulation Profile: Recent Labs  Lab 06/17/19 2248  INR 0.9   Cardiac Enzymes: No results for input(s): CKTOTAL, CKMB, CKMBINDEX, TROPONINI in the last 168 hours. BNP (last 3 results) No results for input(s): PROBNP in the last 8760 hours. HbA1C: Recent Labs    06/18/19 0756  HGBA1C 11.0*   CBG: Recent Labs  Lab 06/19/19 0451 06/19/19 0647 06/19/19 0759 06/19/19 0927 06/19/19 1031  GLUCAP 155* 126* 126* 176* 185*   Lipid Profile: No results for input(s): CHOL, HDL, LDLCALC, TRIG, CHOLHDL, LDLDIRECT in the last 72 hours. Thyroid Function Tests: No results for input(s): TSH, T4TOTAL, FREET4, T3FREE, THYROIDAB in the last 72 hours. Anemia Panel: No results for input(s): VITAMINB12, FOLATE, FERRITIN, TIBC, IRON, RETICCTPCT in the last 72 hours. Sepsis Labs: Recent Labs  Lab 06/17/19 2248 06/18/19 0045  LATICACIDVEN 2.1* 2.4*    Recent Results (from the past 240 hour(s))  Blood Culture (routine x 2)     Status: None (Preliminary result)   Collection Time: 06/17/19 10:48 PM   Specimen: BLOOD  Result Value Ref Range Status   Specimen  Description   Final    BLOOD LEFT ARM Performed at [facility] [address], [place], [place] [address]    Special Requests   Final    BOTTLES DRAWN AEROBIC AND ANAEROBIC Blood Culture adequate volume Performed at [facility] [address], [place], [place] [address]    Culture   Final    NO GROWTH < 12 HOURS Performed at [facility] [address], [place], [place] [address]    Report Status PENDING  Incomplete  Urine culture     Status: Abnormal   Collection Time: 06/17/19 11:28 PM   Specimen: In/Out Cath Urine  Result Value Ref Range Status   Specimen Description   Final    IN/OUT CATH URINE Performed at [facility] [address] [place], [place] [address]    Special Requests   Final  NONE Performed at [facility], [place] [address], [place], [place] [address]    Culture MULTIPLE SPECIES PRESENT, SUGGEST RECOLLECTION (A)  Final   Report Status 06/18/2019 FINAL  Final  SARS CORONAVIRUS 2 (TAT 6-24 HRS) Nasopharyngeal Nasopharyngeal Swab     Status: None   Collection Time: 06/18/19 12:45 AM   Specimen: Nasopharyngeal Swab  Result Value Ref Range Status   SARS Coronavirus 2 NEGATIVE NEGATIVE Final    Comment: (NOTE) SARS-CoV-2 target nucleic acids are NOT DETECTED. The SARS-CoV-2 RNA is generally detectable in upper and lower respiratory specimens during the acute phase of infection. Negative results do not preclude SARS-CoV-2 infection, do not rule out co-infections with other pathogens, and should not be used as the sole basis for treatment or other patient management decisions. Negative results must be combined with clinical observations, patient history, and epidemiological information. The expected result is Negative. Fact Sheet for Patients: SugarRoll.be Fact Sheet for Healthcare Providers: https://www.[employer].com/ This test  is not yet approved or cleared by the [place] FDA and  has been authorized for detection and/or diagnosis of SARS-CoV-2 by FDA under an Emergency Use Authorization (EUA). This EUA will remain  in effect (meaning this test can be used) for the duration of the COVID-19 declaration under Section 56 4(b)(1) of the Act, 21 U.S.C. section 360bbb-3(b)(1), unless the authorization is terminated or revoked sooner. Performed at [facility], [place] [address] [place], [place] [address]   MRSA PCR Screening     Status: None   Collection Time: 06/18/19  3:47 AM   Specimen: Nasal Mucosa; Nasopharyngeal  Result Value Ref Range Status   MRSA by PCR NEGATIVE NEGATIVE Final    Comment:        The GeneXpert MRSA Assay (FDA approved for NASAL specimens only), is one component of a comprehensive MRSA colonization surveillance program. It is not intended to diagnose MRSA infection nor to guide or monitor treatment for MRSA infections. Performed at [facility], [place] [address], [place], [place] [address]          Radiology Studies: No results found.      Scheduled Meds:  Chlorhexidine Gluconate Cloth  6 each Topical Daily   enoxaparin (LOVENOX) injection  40 mg Subcutaneous Daily   Continuous Infusions:  sodium chloride 75 mL/hr at 06/19/19 0619   cefTRIAXone (ROCEPHIN)  IV Stopped (06/19/19 0207)   dextrose 5 % and 0.45% NaCl     dextrose 5 % and 0.45% NaCl 75 mL/hr at 06/18/19 0340   insulin 0.8 Units/hr (06/19/19 1135)   potassium chloride 10 mEq (06/19/19 1120)     LOS: 1 day     Georgette Shell, MD [facility]  If 7PM-7AM, please contact night-coverage [email or web address] Password [facility] 06/19/2019, 11:39 AM

## 2019-06-19 NOTE — Progress Notes (Signed)
Pt left AMA, stated she had insulin at home and could check her blood sugars.  Dr Rodena Piety aware.

## 2019-06-19 NOTE — Progress Notes (Signed)
Patient requesting to leave AMA. Advised if she leaves it is against medical advice and she could get sicker leading to potential death. Encouraged to return to ED if she feels unwell. IV access in left wrist and antecubital removed. Patient is currently dressing to ambulate downstairs.

## 2019-06-22 LAB — CULTURE, BLOOD (ROUTINE X 2)
Culture: NO GROWTH
Special Requests: ADEQUATE

## 2019-06-23 LAB — CULTURE, BLOOD (ROUTINE X 2)
Culture: NO GROWTH
Special Requests: ADEQUATE

## 2019-06-28 NOTE — Discharge Summary (Signed)
Physician Discharge Summary  Sabrina Sutton PVX:[id number] DOB: [birth year] DOA: 06/17/2019  PCP: Sabrina Percy, DO  Admit date: 06/17/2019 Discharge date: 06/28/2019  Admitted Fromhome Disposition AMA  Recommendations for Outpatient Follow-up:  1. Follow up with PCP in 1-2 weeks 2. Please obtain BMP/CBC in one week 3. Please follow up on the following pending results:   Discharge Condition:AMA CODE STATUS:FULL Diet recommendation: CARB MODIFIED Brief/Interim Summary: 23 y.o. female with medical history significant of type 1 diabetes who has not been compliant with her medications, recurrent pyelonephritis, history of HSV infection, marijuana abuse who presented with left-sided flank pain, fever and chills for the last 3 days.  Patient has not been taking her insulin consistently.  She has had recurrent hospitalization and complications from her diabetes.  She has some dysuria.  She was seen in the ER with evidence of pyelonephritis left CVA tenderness and urinalysis consistent with pyelo-.  Also has significant gap of 25 with ketonuria.  Also hypokalemia with potassium 2.9.  Patient being admitted with DKA as well as acute pyelonephritis due to medication noncompliance..  ED Course: Temperature is 98.5 blood pressure 127/95, pulse 135 respirate 21 oxygen sats 99% room air.  White count is 12.8 hemoglobin 16.7 platelets 484.  Sodium 129 potassium 2.9 chloride 83 CO2 21 BUN 16 creatinine 0.9 calcium 10.2 lactic acid 2.0 glucose 246.  Anion gap of 25.  Urinalysis showed cloudy urine with large leukocytes WBC more than 50 RBC 21-50 with rare bacteria.  Pregnancy test is negative.  PT 12.2 INR 0.9.  Urine drug screen is positive for THC.  Patient initiated on IV fluids and IV insulin and is being admitted for treatment.   Discharge Diagnoses:  Principal Problem:   DKA (diabetic ketoacidoses) ([place]) Active Problems:   Acute pyelonephritis   Hypokalemia   Noncompliance with medication  regimen #1 DKA: Patient has ketonuria of 80.  CO2 is compensated however.  She has an anion gap of 25.  We will admit her with DKA and follow the protocol with IV insulin.  Serial BMPs and capillary blood glucose checks hourly.  Adjust insulin.  Change fluids to D5 half-normal at the right time.  Counseled patient on medication management after discharge.patient LEFT AMA  #2 acute pyelonephritis: Patient has left CVA tenderness.  Initiate IV Rocephin while waiting for urine and blood culture results.  #3 marijuana abuse: Counseling provided.  #4 hypokalemia: Replete potassium appropriately.  #5 medication noncompliance: Medication use counseling again reinforced to the patient.     Estimated body mass index is 18.79 kg/m as calculated from the following:   Height as of this encounter: '5\' 7"'  (1.702 m).   Weight as of this encounter: 54.4 kg.  Discharge Instructions   Allergies as of 06/19/2019   No Known Allergies     Medication List    ASK your doctor about these medications   Accu-Chek Aviva Plus test strip Generic drug: glucose blood USE AS DIRECTED   Accu-Chek Aviva Plus test strip Generic drug: glucose blood Please check blood sugars 4 times a day   accu-chek soft touch lancets Use as directed.   Accu-Chek FastClix Lancets Misc Check sugar 10 x daily   Alcohol Pads 70 % Pads Use to wipe skin prior to insulin injections twice daily   B-D UF III MINI PEN NEEDLES 31G X 5 MM Misc Generic drug: Insulin Pen Needle USE TO CHECK BLOOD SUGAR IN THE MORNING BEFORE EATING, BEFORE EACH MEAL, AND AS NEEDED   B-D UF  III MINI PEN NEEDLES 31G X 5 MM Misc Generic drug: Insulin Pen Needle CHECK BLOOD SUGAR IN THE MORNING BEFORE EATING, BEFORE EACH MEAL, AND AS NEEDED   blood glucose meter kit and supplies Dispense based on patient and insurance preference. Use up to four times daily as directed. (FOR ICD-10 E10.9, E11.9).   glucagon 1 MG injection Inject 13m IM if  unconscious, seizing, or unable to eat to correct low blood sugar   insulin glargine 100 UNIT/ML injection Commonly known as: LANTUS Inject 26 Units into the skin daily.   metoCLOPramide 5 MG tablet Commonly known as: REGLAN Take 1 tablet (5 mg total) by mouth 4 (four) times daily -  before meals and at bedtime.   norgestimate-ethinyl estradiol 0.25-35 MG-MCG tablet Commonly known as: ORTHO-CYCLEN Take 1 tablet by mouth daily.   NovoLOG FlexPen 100 UNIT/ML FlexPen Generic drug: insulin aspart Inject 10 Units into the skin 3 (three) times daily.   ondansetron 4 MG disintegrating tablet Commonly known as: ZOFRAN-ODT Take 1 tablet (4 mg total) by mouth every 6 (six) hours as needed for nausea or vomiting.   Prenatal Vitamin 27-0.8 MG Tabs Take 1 tablet by mouth daily.   promethazine 12.5 MG tablet Commonly known as: PHENERGAN Take 1 tablet (12.5 mg total) by mouth every 6 (six) hours as needed for nausea or vomiting.       No Known Allergies  Consultations:  NONE   Procedures/Studies:  No results found. (Echo, Carotid, EGD, Colonoscopy, ERCP)    Subjective:  FEELS BETTER Discharge Exam: Vitals:   06/19/19 1600 06/19/19 1616  BP:  118/73  Pulse:  88  Resp:  20  Temp: 98.5 F (36.9 C)   SpO2:     Vitals:   06/19/19 1440 06/19/19 1550 06/19/19 1600 06/19/19 1616  BP: 119/75   118/73  Pulse: 75 (!) 104  88  Resp: '17 19  20  ' Temp:   98.5 F (36.9 C)   TempSrc:   Oral   SpO2: 100% 100%    Weight:      Height:        General: Pt is alert, awake, not in acute distress Cardiovascular: RRR, S1/S2 +, no rubs, no gallops Respiratory: CTA bilaterally, no wheezing, no rhonchi Abdominal: Soft, NT, ND, bowel sounds + Extremities: no edema, no cyanosis    The results of significant diagnostics from this hospitalization (including imaging, microbiology, ancillary and laboratory) are listed below for reference.     Microbiology: No results found for this  or any previous visit (from the past 240 hour(s)).   Labs: BNP (last 3 results) No results for input(s): BNP in the last 8760 hours. Basic Metabolic Panel: No results for input(s): NA, K, CL, CO2, GLUCOSE, BUN, CREATININE, CALCIUM, MG, PHOS in the last 168 hours. Liver Function Tests: No results for input(s): AST, ALT, ALKPHOS, BILITOT, PROT, ALBUMIN in the last 168 hours. No results for input(s): LIPASE, AMYLASE in the last 168 hours. No results for input(s): AMMONIA in the last 168 hours. CBC: No results for input(s): WBC, NEUTROABS, HGB, HCT, MCV, PLT in the last 168 hours. Cardiac Enzymes: No results for input(s): CKTOTAL, CKMB, CKMBINDEX, TROPONINI in the last 168 hours. BNP: Invalid input(s): POCBNP CBG: No results for input(s): GLUCAP in the last 168 hours. D-Dimer No results for input(s): DDIMER in the last 72 hours. Hgb A1c No results for input(s): HGBA1C in the last 72 hours. Lipid Profile No results for input(s): CHOL, HDL, LDLCALC, TRIG, CHOLHDL,  LDLDIRECT in the last 72 hours. Thyroid function studies No results for input(s): TSH, T4TOTAL, T3FREE, THYROIDAB in the last 72 hours.  Invalid input(s): FREET3 Anemia work up No results for input(s): VITAMINB12, FOLATE, FERRITIN, TIBC, IRON, RETICCTPCT in the last 72 hours. Urinalysis    Component Value Date/Time   COLORURINE YELLOW 06/17/2019 2328   APPEARANCEUR CLOUDY (A) 06/17/2019 2328   LABSPEC 1.023 06/17/2019 2328   PHURINE 5.0 06/17/2019 2328   GLUCOSEU 50 (A) 06/17/2019 2328   HGBUR NEGATIVE 06/17/2019 2328   BILIRUBINUR NEGATIVE 06/17/2019 2328   KETONESUR 80 (A) 06/17/2019 2328   PROTEINUR 100 (A) 06/17/2019 2328   UROBILINOGEN 1.0 08/21/2014 1549   NITRITE NEGATIVE 06/17/2019 2328   LEUKOCYTESUR LARGE (A) 06/17/2019 2328   Sepsis Labs Invalid input(s): PROCALCITONIN,  WBC,  LACTICIDVEN Microbiology No results found for this or any previous visit (from the past 240 hour(s)).   Time coordinating  discharge: 34 minutes  SIGNED:   Georgette Shell, MD  [facility] 06/28/2019, 4:27 PM Pager   If 7PM-7AM, please contact night-coverage [email or web address] Password [email or web address]

## 2019-08-29 ENCOUNTER — Other Ambulatory Visit: Payer: Self-pay | Admitting: *Deleted

## 2019-08-31 MED ORDER — INSULIN GLARGINE 100 UNIT/ML ~~LOC~~ SOLN: 26.0000 [IU] | Freq: Every day | SUBCUTANEOUS | 0 refills | Status: DC

## 2019-09-01 NOTE — Telephone Encounter (Signed)
LM for patient with mother.  She doesn't have her own phone number and mom hasn't seen patient in a few weeks.  She will let her know to call us and make an appointment when she does see or hear from her.  Jazmin Hartsell,CMA

## 2019-09-05 ENCOUNTER — Other Ambulatory Visit: Payer: Self-pay | Admitting: Family Medicine

## 2019-09-10 IMAGING — DX DG CHEST 1V PORT
1 series · 1 of 1 positions shown · non-contrast
Comparison: Chest radiograph 06/13/2017

CLINICAL DATA: Patient with dizziness.

EXAM:
PORTABLE CHEST 1 VIEW

[chest ap]
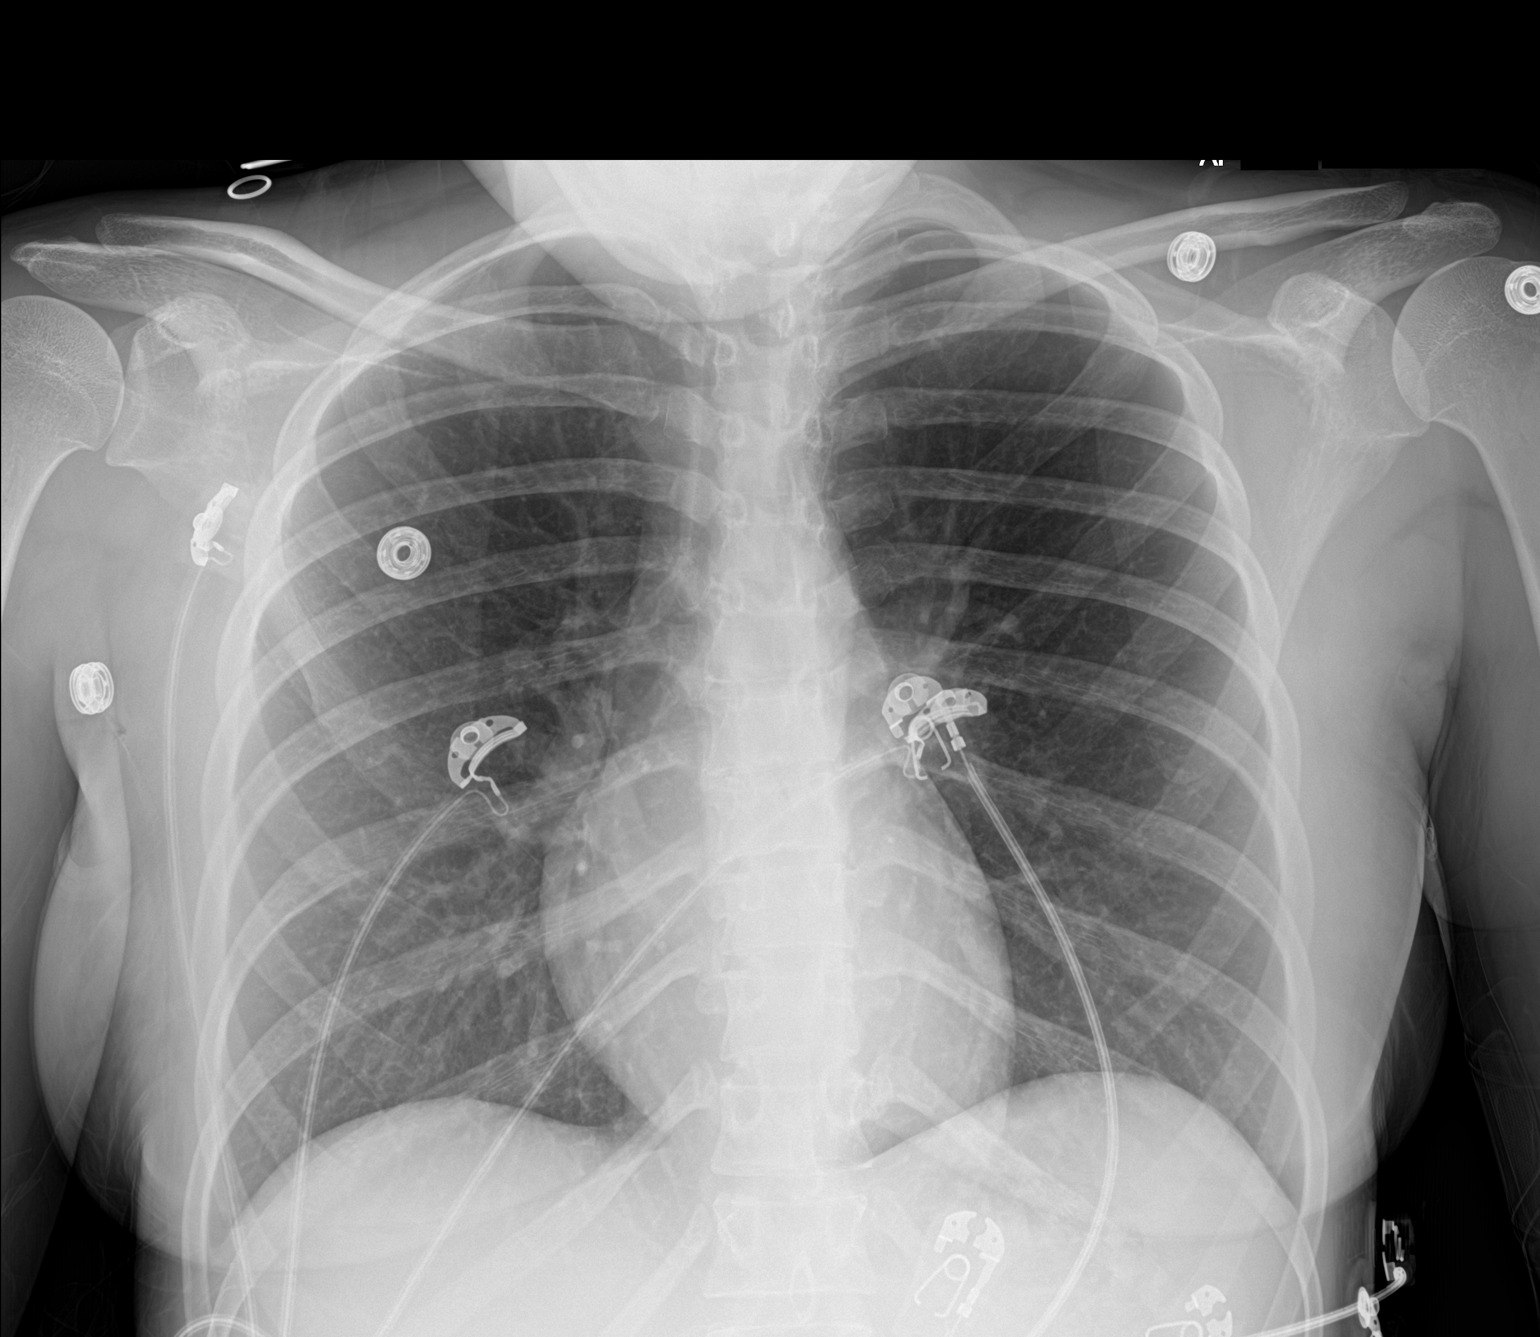

[1 of 1 positions shown; findings below may reference images not displayed]

FINDINGS: Monitoring leads overlie the patient. Normal cardiac and mediastinal
contours. No consolidative pulmonary opacities. No pleural effusion
or pneumothorax.
IMPRESSION: No acute cardiopulmonary process.

## 2019-11-02 ENCOUNTER — Other Ambulatory Visit: Payer: Self-pay | Admitting: Family Medicine

## 2019-11-10 ENCOUNTER — Emergency Department (HOSPITAL_COMMUNITY)
Admission: EM | Admit: 2019-11-10 | Discharge: 2019-11-10 | Disposition: A | Discharge status: Home / Self Care | Payer: Medicaid Other | Attending: Emergency Medicine | Admitting: Emergency Medicine

## 2019-11-10 ENCOUNTER — Other Ambulatory Visit: Payer: Self-pay

## 2019-11-10 ENCOUNTER — Encounter (HOSPITAL_COMMUNITY): Payer: Self-pay

## 2019-11-10 DIAGNOSIS — Z5321 Procedure and treatment not carried out due to patient leaving prior to being seen by health care provider: Secondary | ICD-10-CM | POA: Diagnosis not present

## 2019-11-10 DIAGNOSIS — R1110 Vomiting, unspecified: Secondary | ICD-10-CM | POA: Insufficient documentation

## 2019-11-10 DIAGNOSIS — E1165 Type 2 diabetes mellitus with hyperglycemia: Secondary | ICD-10-CM | POA: Diagnosis not present

## 2019-11-10 DIAGNOSIS — R739 Hyperglycemia, unspecified: Secondary | ICD-10-CM | POA: Insufficient documentation

## 2019-11-10 DIAGNOSIS — R1111 Vomiting without nausea: Secondary | ICD-10-CM | POA: Diagnosis not present

## 2019-11-10 LAB — CBG MONITORING, ED: Glucose-Capillary: 100 mg/dL — ABNORMAL HIGH (ref 70–99)

## 2019-11-10 MED ORDER — SODIUM CHLORIDE 0.9% FLUSH: 3.0000 mL | Freq: Once | INTRAVENOUS | Status: DC

## 2019-11-10 NOTE — ED Notes (Signed)
Two failed attempts to collect labs.

## 2019-11-10 NOTE — ED Notes (Addendum)
No answer when called to update vital signs x1.

## 2019-11-10 NOTE — ED Triage Notes (Signed)
Per EMS- patient  c/o hyperglycemia and emesis. Patient's mother states that these issues seem to occur when she gets her period.

## 2019-11-11 ENCOUNTER — Other Ambulatory Visit: Payer: Self-pay

## 2019-11-11 MED ORDER — NOVOLOG FLEXPEN 100 UNIT/ML ~~LOC~~ SOPN: 10.0000 [IU] | PEN_INJECTOR | Freq: Three times a day (TID) | SUBCUTANEOUS | 0 refills | Status: DC

## 2019-11-14 ENCOUNTER — Encounter: Payer: Self-pay | Admitting: Family Medicine

## 2019-11-14 ENCOUNTER — Other Ambulatory Visit: Payer: Self-pay

## 2019-11-14 ENCOUNTER — Ambulatory Visit (INDEPENDENT_AMBULATORY_CARE_PROVIDER_SITE_OTHER): Payer: Medicaid Other | Admitting: Family Medicine

## 2019-11-14 VITALS — BP 98/58 | HR 96 | Ht 67.0 in | Wt 121.5 lb

## 2019-11-14 DIAGNOSIS — E108 Type 1 diabetes mellitus with unspecified complications: Secondary | ICD-10-CM | POA: Diagnosis not present

## 2019-11-14 LAB — POCT GLYCOSYLATED HEMOGLOBIN (HGB A1C): HbA1c, POC (controlled diabetic range): 9.4 % — AB (ref 0.0–7.0)

## 2019-11-14 MED ORDER — DEXCOM G6 SENSOR MISC: 1.0000 | Freq: Once | 3 refills | Status: AC

## 2019-11-14 MED ORDER — LANTUS SOLOSTAR 100 UNIT/ML ~~LOC~~ SOPN: 26.0000 [IU] | PEN_INJECTOR | SUBCUTANEOUS | 3 refills | Status: DC

## 2019-11-14 MED ORDER — DEXCOM G6 TRANSMITTER MISC: 1.0000 | 0 refills | Status: DC

## 2019-11-14 NOTE — Progress Notes (Signed)
    SUBJECTIVE:   CHIEF COMPLAINT / HPI:   Diabetes, Type 2 - last DKA hospitalization 05/2019. - Last A1c 11.0 05/2019 - Medications: Lantus 26 units daily, NovoLog 10 units 3 times daily with 3-5 units sliding scale - Compliance: good, not using sliding scale.  - Checking BG at home: yes, 3-4 times per day, mostly 100-200s. Lowest 40s, this morning (fasting CBG 66, took morning novolog about 10 mins before she ate). Highest 300s. - Eye exam: Due - Hypoglycemic events have been rare, 40s only occurred once. - not currently working or in school.  PERTINENT  PMH / PSH: T1DM, H/o pyelonephritis  OBJECTIVE:   BP (!) 98/58   Pulse 96   Ht 5\' 7"  (1.702 m)   Wt 121 lb 8 oz (55.1 kg)   LMP 11/10/2019   SpO2 99%   BMI 19.03 kg/m   Gen: well appearing, in NAD Cardiac: reg rate Lungs: normal WOB on RA  ASSESSMENT/PLAN:   Type 1 diabetes mellitus with complications (HCC) Uncontrolled, A1c 9.4 today. Wide range of CBGs reported, mainly 60-300s. Will order continuous glucose monitoring prior to making medication changes. Appointment with pharmacy teaching made for next week. Will have patient follow up about 2 weeks after for potential medication adjustment. Referral for eye exam placed today. Will obtain BMP today.     Sabrina Sutton, [place]

## 2019-11-14 NOTE — Assessment & Plan Note (Addendum)
Uncontrolled, A1c 9.4 today. Wide range of CBGs reported, mainly 60-300s. Will order continuous glucose monitoring prior to making medication changes. Appointment with pharmacy teaching made for next week. Will have patient follow up about 2 weeks after for potential medication adjustment. Referral for eye exam placed today. Will obtain BMP today.

## 2019-11-14 NOTE — Patient Instructions (Signed)
It was great to see you!  Our plans for today:  - No changes to your medications today. - We will get a continuous blood sugar monitoring device. I sent this prescription to your pharmacy. Make an appointment next week with our pharmacy staff to go over how to use this. - See the list of dentists that take Medicaid.  We are checking some labs today, we will call you or send you a letter if they are abnormal.   Take care and seek immediate care sooner if you develop any concerns.   Dr. [facility]

## 2019-11-15 ENCOUNTER — Other Ambulatory Visit: Payer: Self-pay | Admitting: Pharmacist

## 2019-11-15 DIAGNOSIS — E108 Type 1 diabetes mellitus with unspecified complications: Secondary | ICD-10-CM

## 2019-11-15 LAB — BASIC METABOLIC PANEL
BUN/Creatinine Ratio: 8 — ABNORMAL LOW (ref 9–23)
BUN: 6 mg/dL (ref 6–20)
CO2: 23 mmol/L (ref 20–29)
Calcium: 9.9 mg/dL (ref 8.7–10.2)
Chloride: 101 mmol/L (ref 96–106)
Creatinine, Ser: 0.79 mg/dL (ref 0.57–1.00)
GFR calc Af Amer: 124 mL/min/{1.73_m2} (ref >59)
GFR calc non Af Amer: 107 mL/min/{1.73_m2} (ref >59)
Glucose: 123 mg/dL — ABNORMAL HIGH (ref 65–99)
Potassium: 3.9 mmol/L (ref 3.5–5.2)
Sodium: 141 mmol/L (ref 134–144)

## 2019-11-15 MED ORDER — DEXCOM G6 TRANSMITTER MISC: 1.0000 | 3 refills | Status: DC

## 2019-11-15 MED ORDER — DEXCOM G6 RECEIVER DEVI: 1.0000 | 2 refills | Status: DC

## 2019-11-15 MED ORDER — DEXCOM G6 SENSOR MISC: 1.0000 | 11 refills | Status: DC

## 2019-11-20 ENCOUNTER — Encounter: Payer: Self-pay | Admitting: Pharmacist

## 2019-11-20 ENCOUNTER — Ambulatory Visit (INDEPENDENT_AMBULATORY_CARE_PROVIDER_SITE_OTHER): Payer: Medicaid Other | Admitting: Pharmacist

## 2019-11-20 ENCOUNTER — Other Ambulatory Visit: Payer: Self-pay

## 2019-11-20 DIAGNOSIS — E108 Type 1 diabetes mellitus with unspecified complications: Secondary | ICD-10-CM

## 2019-11-20 MED ORDER — NOVOLOG FLEXPEN 100 UNIT/ML ~~LOC~~ SOPN: 10.0000 [IU] | PEN_INJECTOR | Freq: Three times a day (TID) | SUBCUTANEOUS | 3 refills | Status: DC

## 2019-11-20 MED ORDER — ACCU-CHEK GUIDE ME W/DEVICE KIT: 1.0000 | PACK | 1 refills | Status: DC

## 2019-11-20 MED ORDER — BAQSIMI TWO PACK 3 MG/DOSE NA POWD: 1.0000 | NASAL | 3 refills | Status: DC

## 2019-11-20 NOTE — Progress Notes (Signed)
S:     Chief Complaint  Patient presents with  . Medication Management    CGM - Dexcom    Patient arrives in good spirits ambulating without assistance.  Presents for diabetes evaluation, education, and management Patient was referred and last seen by Primary Care Provider on Dr. Ky Barban on 11/14/19. Patient has T1DM. Patient has not obtained Dexcom G6 CGM from pharmacy at this time. She prefers to go to the [facility] on [address] in [place]. She is interested in using Dexcom follow app and inviting her mother to use the app to follow her BG readings.  Patient reports Diabetes was diagnosed in 2017.   Insurance coverage/medication affordability: Medicaid  Patient reports adherence with medications.  Current diabetes medications include: Lantus 26 units daily, NovoLog 10 units three times daily    Dexcom G6 patient education Person(s)instructed: patient    Instruction: Patient oriented to three components of Dexcom G6 continuous glucose monitor (sensor, transmitter, receiver/cellphone) Receiver or cellphone: cellphone -Dexcom G6 AND dexcom clarity app downloaded onto cellphone  -Patient educated that Dexom G6 app must always be running (patient should not close out of app) -If using Dexcom G6 app, patient may share blood glucose data with up to 10 followers on dexcom follow app. Dexcom G6 account email: [email or web address] Dexcom G6 account username: [place] account password: [email or web address] Sensor code: Nurse, adult code: [phone] Sharing code: [id number]  CGM overview and set-up  1. Button, touch screen, and icons 2. Power supply and recharging 3. Home screen 4. Date and time 5. Set BG target range: 80-250 mg/dL 6. Set alarm/alert tone  7. Interstitial vs. capillary blood glucose readings  8. When to verify sensor reading with fingerstick blood glucose 9. Blood glucose reading measured every five minutes. 10. Sensor will last 10 days  11. Transmitter will last 90 days and must be reused  12. Transmitter must be within 20 feet of receiver/cell phone.  Sensor application -- sensor placed on right side of abdomen  1. Site selection and site prep with alcohol pad 2. Sensor prep-sensor pack and sensor applicator 3. Sensor applied to area away from waistband, scarring, tattoos, irritation, and bones 4. Transmitter sanitized with alcohol pad and inserted into sensor. 5. Starting the sensor: 2 hour warm up before BG readings available 6. Sensor change every 10 days and rotate site 7. Call Dexcom customer service if sensor comes off before 10 days  Safety and Troubleshooting 1. Do a fingerstick blood glucose test if the sensor readings do not match how    you feel 2. Remove sensor prior to magnetic resonance imaging (MRI), computed tomography (CT) scan, or high-frequency electrical heat (diathermy) treatment. 3. Do not allow sun screen or insect repellant to come into contact with Dexcom G6. These skin care products may lead for the plastic used in the Dexcom G6 to crack. 4. Dexcom G6 may be worn through a Environmental education officer. It may not be exposed to an advanced Imaging Technology (AIT) body scanner (also called a millimeter wave scanner) or the baggage x-ray machine. Instead, ask for hand-wanding or full-body pat-down and visual inspection.  5. Doses of acetaminophen (Tylenol) >1 gram every 6 hours may cause false high readings. 6. Hydroxyurea (Hydrea, Droxia) may interfere with accuracy of blood glucose readings from Dexcom G6. 7. Store sensor kit between 36 and 86 degrees Farenheit. Can be refrigerated within this temperature range.  Contact information provided for [facility] customer service and/or trainer.  O:  Physical Exam Constitutional:      Appearance: Normal appearance.  Psychiatric:        Mood and Affect: Mood normal.        Behavior: Behavior normal.        Thought Content: Thought content normal.         Judgment: Judgment normal.      Review of Systems  All other systems reviewed and are negative.    Lab Results  Component Value Date   HGBA1C 9.4 (A) 11/14/2019   There were no vitals filed for this visit.    Clinical Atherosclerotic Cardiovascular Disease (ASCVD): No  The ASCVD Risk score Mikey Bussing DC Jr., et al., 2013) failed to calculate for the following reasons:   The 2013 ASCVD risk score is only valid for ages 27 to 1    A/P: Diabetes  Diabetes longstanding since 2017 currently controlled. Patient is adherent with medication. Control is suboptimal due to relatively recent diagnosis and lack of ability to fully assess 24/7 BG readings. Dexcom G6 placed on patient's right side of abdomen successfully. -Continued basal insulin Lantus (insulin glargine) 26 units daily. -Continued  rapid insulin NovoLog (insulin aspart) 10 units three times daily with meals -Initiated Dexcom G6 CGM. Completed prior authorization for CGM. Patient will likely be approved considering she has been instructed to check BG readings 4x or more and injects 2 or more insulin injections daily. -Extensively discussed pathophysiology of diabetes, recommended lifestyle interventions, dietary effects on blood sugar control -Counseled on s/sx of and management of hypoglycemia -Next A1C anticipated 01/2020   Written patient instructions provided.  Total time in face to face counseling 120 minutes.   Follow up as needed with Pharmacist or PCP.  Patient seen with Maryan Rued, PharmD PGY-1 Pharmacy Resident and Drexel Iha, PharmD, PGY2 Pharmacy Resident.

## 2019-11-20 NOTE — Assessment & Plan Note (Signed)
Diabetes longstanding since 2017 currently controlled. Patient is adherent with medication. Control is suboptimal due to relatively recent diagnosis and lack of ability to fully assess 24/7 BG readings. Dexcom G6 placed on patient's right side of abdomen successfully. -Continued basal insulin Lantus (insulin glargine) 26 units daily. -Continued  rapid insulin NovoLog (insulin aspart) 10 units three times daily with meals -Initiated Dexcom G6 CGM. Completed prior authorization for CGM. Patient will likely be approved considering she has been instructed to check BG readings 4x or more and injects 2 or more insulin injections daily. -Extensively discussed pathophysiology of diabetes, recommended lifestyle interventions, dietary effects on blood sugar control -Counseled on s/sx of and management of hypoglycemia -Next A1C anticipated 01/2020

## 2019-11-20 NOTE — Progress Notes (Signed)
Reviewed: I agree with the documentation and management of Dr. Koval. 

## 2019-11-20 NOTE — Patient Instructions (Signed)
It was a pleasure seeing you in clinic today Ms. Woolman!  1. Remember to tell your mom to download Dexcom Follow App. You will go to share in the top left corner of the Dexcom G6 app. From there you will enter information you would like to share and the follower's email address. The follower will go to their email then press on green button which will take them to follow app to accept your invitation.  Today the plan is...  Please call the PharmD clinic at [phone] if you have any questions that you would like to speak with a pharmacist about (Dr. Nicholas Lose or Dr. Drexel Iha).   Please remember... 1. Sensor will last 10 days 2. Transmitter will last 90 days and must be reused 3. Sensor should be applied to area away from waistband, scarring, tattoos, irritation, and bones. 4. Transmitter must be within 20 feet of receiver/cell phone. 5. If using Dexcom G6 app on cell phone, please remember to keep app open (do not close out of app). 6. Do a fingerstick blood glucose test if the sensor readings do not match how    you feel 7. Remove sensor prior to magnetic resonance imaging (MRI), computed tomography (CT) scan, or high-frequency electrical heat (diathermy) treatment. 8. Do not allow sun screen or insect repellant to come into contact with Dexcom G6. These skin care products may lead for the plastic used in the Dexcom G6 to crack. 9. Dexcom G6 may be worn through a Environmental education officer. It may not be exposed to an advanced Imaging Technology (AIT) body scanner (also called a millimeter wave scanner) or the baggage x-ray machine. Instead, ask for hand-wanding or full-body pat-down and visual inspection.  10. Doses of acetaminophen (Tylenol) >1 gram every 6 hours may cause false high readings. 11. Hydroxyurea (Hydrea, Droxia) may interfere with accuracy of blood glucose readings from Dexcom G6. 12. Store sensor kit between 36 and 86 degrees Farenheit. Can be refrigerated within this  temperature range.  Ordering Overlay Patches 1. Go to the following website every 30 days to order new overlay patches:  [email or web address]  Problems with Dexcom sticking? 1. Order Skin Tac from [facility]. Alcohol swab area you plan to administer Dexcom then let dry. Once dry, apply Skin Tac in a circular motion (with a spot in the middle for sensor without skin tac) and let dry. Once dry you can apply Dexcom!   Problems taking off Dexcom? 1. Remember to try to shower before removing Dexcom 2. Order Tac Away to help remove any extra adhesive left on your skin once you remove [facility]  Dexcom Customer Service Information 1. Customer Sales Support (dexcom orders and general customer questions) Phone number: [phone] Monday - Friday  6 AM - 5 PM PST Saturday 8 AM - 4 PM PST  *Contact if you do not receive overlay patches  2. Global Technical Support (product troubleshooting or replacement inquiries) Phone number: [phone] Available 24 hours a day; 7 days a week  *Contact if you have a "bad" sensor. Remember to tell them you are wearing Dexcom on your stomach!  3. Dexcom Care (provides dexcom CGM training, software downloads, and tutorials) Phone number: [phone] Monday - Friday 6 AM - 5 PM PST Saturday 7 AM - 1:30 PM PST (All hours subject to change)  4. Website: [email or web address]

## 2019-11-26 ENCOUNTER — Telehealth: Payer: Self-pay | Admitting: Pharmacist

## 2019-11-26 NOTE — Telephone Encounter (Signed)
After reviewing her CGM results, attempted to call and discuss readings and potential adjustments to insulin regimen.   No answer.  Left HIPAA compliant Voice Mail requesting call back. Provided my direct line.   I will attempt again in 2 days if she fails to return my call.

## 2019-11-28 ENCOUNTER — Telehealth: Payer: Self-pay | Admitting: Pharmacist

## 2019-11-28 DIAGNOSIS — E108 Type 1 diabetes mellitus with unspecified complications: Secondary | ICD-10-CM

## 2019-11-28 MED ORDER — NOVOLOG FLEXPEN 100 UNIT/ML ~~LOC~~ SOPN: 8.0000 [IU] | PEN_INJECTOR | Freq: Three times a day (TID) | SUBCUTANEOUS | Status: DC

## 2019-11-28 MED ORDER — LANTUS SOLOSTAR 100 UNIT/ML ~~LOC~~ SOPN: 22.0000 [IU] | PEN_INJECTOR | SUBCUTANEOUS | 3 refills | Status: DC

## 2019-11-28 NOTE — Telephone Encounter (Signed)
Contacted patient to discuss highly variable readings noted on the Dexcom CGM.   She reported that she felt like the CGM was helping her with her management.  We discussed her management and she described that she is using Lantus 26 units once daily, Novolog 5-10 units with meals.   Following some discussion, we agreed to lower Lantus from 26 to 22 and Sliding scale minimum was increased to 8 units.  She was encouraged to use 8 units when significant carbohydrate was present in her meals.  She understood that her blood sugar was highly variable.   We agreed that a call back in 7-10 days would be appropriate.  I will plan to call her 5/17.

## 2019-12-01 NOTE — Telephone Encounter (Signed)
Noted and agree. 

## 2019-12-17 ENCOUNTER — Other Ambulatory Visit: Payer: Self-pay

## 2019-12-17 ENCOUNTER — Encounter (HOSPITAL_COMMUNITY): Payer: Self-pay

## 2019-12-17 ENCOUNTER — Emergency Department (HOSPITAL_COMMUNITY)
Admission: EM | Admit: 2019-12-17 | Discharge: 2019-12-17 | Disposition: A | Discharge status: Home / Self Care | Payer: Medicaid Other | Attending: Emergency Medicine | Admitting: Emergency Medicine

## 2019-12-17 DIAGNOSIS — R110 Nausea: Secondary | ICD-10-CM | POA: Diagnosis not present

## 2019-12-17 DIAGNOSIS — Z5321 Procedure and treatment not carried out due to patient leaving prior to being seen by health care provider: Secondary | ICD-10-CM | POA: Diagnosis not present

## 2019-12-17 DIAGNOSIS — R112 Nausea with vomiting, unspecified: Secondary | ICD-10-CM | POA: Diagnosis not present

## 2019-12-17 DIAGNOSIS — R1084 Generalized abdominal pain: Secondary | ICD-10-CM | POA: Diagnosis not present

## 2019-12-17 DIAGNOSIS — R52 Pain, unspecified: Secondary | ICD-10-CM | POA: Diagnosis not present

## 2019-12-17 DIAGNOSIS — I959 Hypotension, unspecified: Secondary | ICD-10-CM | POA: Diagnosis not present

## 2019-12-17 LAB — I-STAT BETA HCG BLOOD, ED (MC, WL, AP ONLY): I-stat hCG, quantitative: <5 m[IU]/mL (ref <5)

## 2019-12-17 LAB — CBG MONITORING, ED: Glucose-Capillary: 238 mg/dL — ABNORMAL HIGH (ref 70–99)

## 2019-12-17 MED ORDER — ONDANSETRON 4 MG PO TBDP
4.0000 mg | ORAL_TABLET | Freq: Once | ORAL | Status: AC
  Administered 2019-12-17: 4 mg via ORAL
  Filled 2019-12-17: qty 1

## 2019-12-17 NOTE — ED Notes (Signed)
Pt sts wanting to go home due to the wait times. Comes back to triage for zofran and IV to be removed.

## 2019-12-17 NOTE — ED Triage Notes (Signed)
Pt sts emesis x 2 days. EMS administered 4mg  Zofran IV. DM I. cbg 238 in triage.

## 2019-12-18 ENCOUNTER — Ambulatory Visit (HOSPITAL_COMMUNITY)
Admission: EM | Admit: 2019-12-18 | Discharge: 2019-12-18 | Disposition: A | Discharge status: Home / Self Care | Payer: Medicaid Other | Attending: Urgent Care | Admitting: Urgent Care

## 2019-12-18 ENCOUNTER — Other Ambulatory Visit: Payer: Self-pay

## 2019-12-18 ENCOUNTER — Encounter (HOSPITAL_COMMUNITY): Payer: Self-pay | Admitting: Emergency Medicine

## 2019-12-18 ENCOUNTER — Telehealth: Payer: Self-pay

## 2019-12-18 DIAGNOSIS — Z794 Long term (current) use of insulin: Secondary | ICD-10-CM

## 2019-12-18 DIAGNOSIS — R112 Nausea with vomiting, unspecified: Secondary | ICD-10-CM | POA: Diagnosis not present

## 2019-12-18 DIAGNOSIS — E860 Dehydration: Secondary | ICD-10-CM

## 2019-12-18 DIAGNOSIS — E1065 Type 1 diabetes mellitus with hyperglycemia: Secondary | ICD-10-CM

## 2019-12-18 DIAGNOSIS — R1110 Vomiting, unspecified: Secondary | ICD-10-CM | POA: Diagnosis not present

## 2019-12-18 DIAGNOSIS — R1011 Right upper quadrant pain: Secondary | ICD-10-CM | POA: Diagnosis not present

## 2019-12-18 DIAGNOSIS — F1290 Cannabis use, unspecified, uncomplicated: Secondary | ICD-10-CM

## 2019-12-18 LAB — POCT URINALYSIS DIP (DEVICE)
Glucose, UA: 100 mg/dL — AB
Ketones, ur: 40 mg/dL — AB
Leukocytes,Ua: NEGATIVE
Nitrite: NEGATIVE
Protein, ur: 100 mg/dL — AB
Specific Gravity, Urine: >=1.03 (ref 1.005–1.030)
Urobilinogen, UA: 0.2 mg/dL (ref 0.0–1.0)
pH: 5.5 (ref 5.0–8.0)

## 2019-12-18 LAB — CBG MONITORING, ED: Glucose-Capillary: 162 mg/dL — ABNORMAL HIGH (ref 70–99)

## 2019-12-18 MED ORDER — ONDANSETRON HCL 4 MG/2ML IJ SOLN: 4.0000 mg | Freq: Once | INTRAMUSCULAR | Status: DC

## 2019-12-18 MED ORDER — ONDANSETRON HCL 4 MG/2ML IJ SOLN
4.0000 mg | Freq: Once | INTRAMUSCULAR | Status: AC
  Administered 2019-12-18: 4 mg via INTRAMUSCULAR

## 2019-12-18 MED ORDER — ONDANSETRON HCL 4 MG/2ML IJ SOLN
INTRAMUSCULAR | Status: AC
  Filled 2019-12-18: qty 2

## 2019-12-18 MED ORDER — SODIUM CHLORIDE 0.9 % IV BOLUS: 1000.0000 mL | Freq: Once | INTRAVENOUS | Status: DC

## 2019-12-18 MED ORDER — ONDANSETRON 8 MG PO TBDP
8.0000 mg | ORAL_TABLET | Freq: Three times a day (TID) | ORAL | 0 refills | Status: DC | PRN
Start: 2019-12-18 — End: 2020-01-17

## 2019-12-18 NOTE — ED Provider Notes (Addendum)
[place]   MRN: [id number] DOB: 1995-11-01  Subjective:   Sabrina Sutton is a 24 y.o. female presenting for 2-day history of persistent nausea with vomiting, difficulty holding any foods down.  Patient has now had left upper quadrant pain.  She has a type I diabetic, takes her insulin regularly.  Smokes marijuana 4 times daily.  Has previously been told that she is abusing marijuana and needs to quit.  Denies history of gallbladder or liver issues.  Denies history of kidney disease.  No current facility-administered medications for this encounter.  Current Outpatient Medications:  .  ACCU-CHEK AVIVA PLUS test strip, USE AS DIRECTED, Disp: 100 each, Rfl: 11 .  Accu-Chek FastClix Lancets MISC, Check sugar 10 x daily, Disp: 304 each, Rfl: 3 .  Alcohol Swabs (ALCOHOL PADS) 70 % PADS, Use to wipe skin prior to insulin injections twice daily, Disp: 200 each, Rfl: 6 .  blood glucose meter kit and supplies, Dispense based on patient and insurance preference. Use up to four times daily as directed. (FOR ICD-10 E10.9, E11.9)., Disp: 1 each, Rfl: 0 .  Blood Glucose Monitoring Suppl (ACCU-CHEK GUIDE ME) w/Device KIT, 1 kit by Does not apply route as directed., Disp: 1 kit, Rfl: 1 .  Continuous Blood Gluc Receiver (DEXCOM G6 RECEIVER) DEVI, 1 Device by Does not apply route as directed., Disp: 1 each, Rfl: 2 .  Continuous Blood Gluc Sensor (DEXCOM G6 SENSOR) MISC, Inject 1 applicator into the skin as directed. (change sensor every 10 days), Disp: 3 each, Rfl: 11 .  Continuous Blood Gluc Transmit (DEXCOM G6 TRANSMITTER) MISC, Inject 1 Device into the skin as directed. (re-use up to 8x with each new sensor), Disp: 1 each, Rfl: 3 .  Glucagon (BAQSIMI TWO PACK) 3 MG/DOSE POWD, Place 1 spray into the nose as directed., Disp: 1 each, Rfl: 3 .  glucose blood (ACCU-CHEK AVIVA PLUS) test strip, PLEASE CHECK BLOOD SUGAR FOUR TIMES DAILY, Disp: 600 strip, Rfl: 2 .  insulin glargine (LANTUS SOLOSTAR) 100  UNIT/ML Solostar Pen, Inject 22 Units into the skin every morning., Disp: 15 mL, Rfl: 3 .  Insulin Pen Needle (B-D UF III MINI PEN NEEDLES) 31G X 5 MM MISC, USE TO CHECK BLOOD SUGAR IN THE MORNING BEFORE EATING, BEFORE EACH MEAL, AND AS NEEDED, Disp: 1000 each, Rfl: 0 .  Insulin Pen Needle (B-D UF III MINI PEN NEEDLES) 31G X 5 MM MISC, CHECK BLOOD SUGAR IN THE MORNING BEFORE EATING, BEFORE EACH MEAL, AND AS NEEDED, Disp: 1000 each, Rfl: 0 .  Lancets (ACCU-CHEK SOFT TOUCH) lancets, Use as directed., Disp: 100 each, Rfl: 5 .  norgestimate-ethinyl estradiol (ORTHO-CYCLEN) 0.25-35 MG-MCG tablet, Take 1 tablet by mouth daily. (Patient not taking: Reported on 11/20/2019), Disp: 1 Package, Rfl: 11 .  NOVOLOG FLEXPEN 100 UNIT/ML FlexPen, Inject 8-10 Units into the skin 3 (three) times daily., Disp: , Rfl:    No Known Allergies  Past Medical History:  Diagnosis Date  . Diabetes mellitus without complication ([place]) 0/96/28   + GAD Ab  . DKA (diabetic ketoacidoses) ([place]) 09/20/2015  . Hypokalemia   . Pyelonephritis 04/17/2016     Past Surgical History:  Procedure Laterality Date  . NO PAST SURGERIES      Family History  Problem Relation Age of Onset  . Diabetes Maternal Grandmother   . Heart disease Maternal Grandmother        Deceased from MI at age 80  . Hypertension Maternal Grandmother   . Hypercholesterolemia Mother   .  Seizures Mother   . Kidney Stones Mother   . Hyperlipidemia Mother   . Stroke Maternal Grandfather        Deceased from stroke at age 24  . Hypertension Paternal Grandmother   . Healthy Father     Social History   Tobacco Use  . Smoking status: Current Every Day Smoker    Packs/day: 0.25    Types: Cigarettes  . Smokeless tobacco: Never Used  Substance Use Topics  . Alcohol use: Yes    Comment: Last drank 1 month ago per MD note  . Drug use: Yes    Types: Marijuana    Comment: Last used 1  month ago per MD note    ROS   Objective:   Vitals: BP 115/82    Pulse (!) 127   Temp 100 F (37.8 C) (Oral)   Resp 18   LMP 11/20/2019   SpO2 100%   Physical Exam Constitutional:      General: She is not in acute distress.    Appearance: Normal appearance. She is well-developed and normal weight. She is not ill-appearing, toxic-appearing or diaphoretic.  HENT:     Head: Normocephalic and atraumatic.     Right Ear: External ear normal.     Left Ear: External ear normal.     Nose: Nose normal.     Mouth/Throat:     Mouth: Mucous membranes are dry.  Eyes:     General: No scleral icterus.    Extraocular Movements: Extraocular movements intact.     Pupils: Pupils are equal, round, and reactive to light.  Cardiovascular:     Rate and Rhythm: Regular rhythm. Tachycardia present.     Heart sounds: Normal heart sounds. No murmur. No friction rub. No gallop.   Pulmonary:     Effort: Pulmonary effort is normal. No respiratory distress.     Breath sounds: Normal breath sounds. No stridor. No wheezing, rhonchi or rales.  Abdominal:     General: Bowel sounds are normal. There is no distension.     Palpations: Abdomen is soft. There is no mass.     Tenderness: There is abdominal tenderness (upper). There is no right CVA tenderness, left CVA tenderness, guarding or rebound.  Skin:    General: Skin is warm and dry.     Coloration: Skin is not pale.     Findings: No rash.  Neurological:     General: No focal deficit present.     Mental Status: She is alert and oriented to person, place, and time.  Psychiatric:        Mood and Affect: Mood normal.        Behavior: Behavior normal.        Thought Content: Thought content normal.        Judgment: Judgment normal.     Results for orders placed or performed during the hospital encounter of 12/18/19 (from the past 72 hour(s))  POC CBG monitoring     Status: Abnormal   Collection Time: 12/18/19  7:41 PM  Result Value Ref Range   Glucose-Capillary 162 (H) 70 - 99 mg/dL    Comment: Glucose reference  range applies only to samples taken after fasting for at least 8 hours.  POCT urinalysis dip (device)     Status: Abnormal   Collection Time: 12/18/19  7:47 PM  Result Value Ref Range   Glucose, UA 100 (A) NEGATIVE mg/dL   Bilirubin Urine MODERATE (A) NEGATIVE   Ketones, ur 40 (  A) NEGATIVE mg/dL   Specific Gravity, Urine >=1.030 1.005 - 1.030   Hgb urine dipstick TRACE (A) NEGATIVE   pH 5.5 5.0 - 8.0   Protein, ur 100 (A) NEGATIVE mg/dL   Urobilinogen, UA 0.2 0.0 - 1.0 mg/dL   Nitrite NEGATIVE NEGATIVE   Leukocytes,Ua NEGATIVE NEGATIVE    Comment: Biochemical Testing Only. Please order routine urinalysis from main lab if confirmatory testing is needed.   Negative beta hcg yesterday from her ER visit, left w/o being seen.  Assessment and Plan :   PDMP not reviewed this encounter.  1. Nausea and vomiting, intractability of vomiting not specified, unspecified vomiting type   2. Right upper quadrant abdominal pain   3. Dehydration   4. Type 1 diabetes mellitus with hyperglycemia (HCC)   5. Cannabinoid hyperemesis syndrome     Patient has cannabinoid hyperemesis syndrome that has led to significant dehydration.  This is a significant risk for patient given her diabetes. IV attempt was unsuccessful. Patient is a very difficult stick, has hx of needing U/S guidance for this. Zofran as an outpatient. Follow up with PCP for cannabis abuse. Counseled patient on potential for adverse effects with medications prescribed/recommended today, ER and return-to-clinic precautions discussed, patient verbalized understanding.   Jaynee Eagles, PA-C 12/18/19 2016

## 2019-12-18 NOTE — ED Notes (Signed)
Attempted IV x1.

## 2019-12-18 NOTE — Telephone Encounter (Signed)
Patient's mother calls nurse line due to nausea and vomiting. Patient was seen in the ED yesterday evening regarding same issue. Mother states that she LWBS due to long wait times. Patient is Type I diabetic. Mother reports blood glucose readings from 107-175 throughout the day. Denies diarrhea and fever. Mother reports that she is trying to stay hydrated as much as possible. Mother states that patient was given zofran in the ED last night and that greatly helped with the nausea and vomiting. Scheduled patient virtual myChart visit with Dr. Ouida Sills tomorrow AM. Encouraged close monitoring of blood sugars (Patient has dexcom meter) and drinking plenty of fluids. Mother familiar with DKA symptoms and precautions.    Strict ED precautions given.   Please advise if there are any additional recommendations in the meantime.   Talbot Grumbling, RN

## 2019-12-18 NOTE — ED Triage Notes (Signed)
Emesis for 2 days. More than 10 episodes vomiting in last 24 hours. Zofran helped yesterday. She was in the ED last night but left due to wait times.

## 2019-12-18 NOTE — Discharge Instructions (Addendum)
We were unable to establish an IV line for you tonight. You have been given IM Zofran. If you are not able to take in fluids tonight, your dehydration can worsen. If this is the case, you need to go to the emergency room. Otherwise, please make sure you stop using marijuana and if necessary talk with your PCP about getting into outpatient rehab for this.

## 2019-12-19 ENCOUNTER — Encounter: Payer: Self-pay | Admitting: Family Medicine

## 2019-12-19 ENCOUNTER — Telehealth (INDEPENDENT_AMBULATORY_CARE_PROVIDER_SITE_OTHER): Payer: Medicaid Other | Admitting: Family Medicine

## 2019-12-19 DIAGNOSIS — F339 Major depressive disorder, recurrent, unspecified: Secondary | ICD-10-CM | POA: Diagnosis not present

## 2019-12-19 DIAGNOSIS — F12188 Cannabis abuse with other cannabis-induced disorder: Secondary | ICD-10-CM | POA: Diagnosis not present

## 2019-12-19 HISTORY — DX: Cannabis abuse with other cannabis-induced disorder: F12.188

## 2019-12-19 HISTORY — DX: Major depressive disorder, recurrent, unspecified: F33.9

## 2019-12-19 NOTE — Assessment & Plan Note (Signed)
Patient with PHQ-9 score of 8, answer to question 9 equals 0. -CCM referral placed to help this patient with Medicaid obtain access to a counselor

## 2019-12-19 NOTE — Progress Notes (Signed)
[place] Telemedicine Visit  Patient consented to have virtual visit and was identified by name and date of birth. Method of visit: Video  Encounter participants: Patient: Georgiann Furio - located at home Provider: Daisy Floro - located at family practice center Others (if applicable): Mother, Sady Mila  Chief Complaint: Follow-up nausea/vomiting  HPI: Nausea/Vomiting: This very pleasant patient with type 1 diabetes is following up with her doctor's office after being seen yesterday in the emergency department for 3 days of nausea/vomiting. She reports that she has not been able to keep anything down and has been having nausea and vomiting all day.  She would sometimes try to drink ginger ale or water, but this was "come right back up".  Patient was seen in the emergency department last night for her nausea and vomiting.  The patient reports that she has been smoking marijuana since she was 79 or 24 years old, currently smokes 4 times daily but "used to smoke a lot more frequently".  In the emergency department the provider was concerned for cannabinoid hyperemesis.  She was given Zofran which decreased the nausea and vomiting.  Patient was able to drink ginger ale, and was deemed safe to go home with follow-up with her doctor, especially for smoking cessation help.  When she got home she reports that she took NyQuil to help sleep, but this upset her stomach, she threw up again, but was able to sleep anyways.  This morning, she reports feeling much better.  She has been tolerating liquids.  She denies any headaches, fevers, dizziness, vision changes, chest pain, abdominal pain, diarrhea, or rashes.  Elevated PHQ-9: score of 8 today, patient answered 0 on question 9.  Patient reports that her symptoms of feeling depressed have been going on since 2017 when she was diagnosed with type 1 diabetes.  She has never been treated for depression before and has never  received counseling or therapy.  She reports that she is open to starting counseling, her mother agrees that this is a good thing to do.  She endorses being safe to herself and others.  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 -Start Smart goal today 5/28 -Continue Zofran as needed for nausea/vomiting -Follow-up Monday, June 14 at 1:30 PM  Depression, recurrent [facility]) Patient with PHQ-9 score of 8, answer to question 9 equals 0. -CCM referral placed to help this patient with Medicaid obtain access to a counselor    Time spent during visit with patient: 12 minutes  Milus Banister, [place], PGY-2 12/19/2019 6:06 PM

## 2019-12-19 NOTE — Patient Instructions (Signed)
Please look out to any of the resources listed below for a therapist, I have also placed an order for additional help and support in finding a counselor for you.  Psychiatry and [place] [address] [place], [place] [phone]  Therap[facility]  [facility] [address] [place], [place]  [facility]  [facility] [address]      [address] [place] [address]     [place], [place]       [phone]  [facility]  [facility] [address]   [address] [place], [place] [address]   [place] [address] [phone]     [phone]  [facility]   [facility] [address]    [place], [place] [address]    [place], [place] [address] [phone]      [phone]

## 2019-12-19 NOTE — Assessment & Plan Note (Signed)
Patient set smart goals for decreasing her marijuana smoking, desires to start only smoking 1-2 times daily (currently smoking 4 times daily) starting today 12/19/2019. -Start Smart goal today 5/28 -Continue Zofran as needed for nausea/vomiting -Follow-up Monday, June 14 at 1:30 PM

## 2019-12-25 ENCOUNTER — Telehealth: Payer: Self-pay | Admitting: Pharmacist

## 2019-12-25 ENCOUNTER — Telehealth: Payer: Self-pay

## 2019-12-25 DIAGNOSIS — E108 Type 1 diabetes mellitus with unspecified complications: Secondary | ICD-10-CM

## 2019-12-25 MED ORDER — NOVOLOG FLEXPEN 100 UNIT/ML ~~LOC~~ SOPN: 3.0000 [IU] | PEN_INJECTOR | Freq: Three times a day (TID) | SUBCUTANEOUS | Status: DC

## 2019-12-25 MED ORDER — LANTUS SOLOSTAR 100 UNIT/ML ~~LOC~~ SOPN: 18.0000 [IU] | PEN_INJECTOR | SUBCUTANEOUS | Status: DC

## 2019-12-25 NOTE — Telephone Encounter (Signed)
Returned call to patient and her mother to discuss request for nausea medication on 5/26  Patient and mother report she is feeling much better since she has restarted use of Dexcom CGM about 5 days ago.   Patient continues to note several low readings and reports nocturnal alarms continue to wake her during the night.  She admits to skipping meals.   Her lantus dose if 22 units once daily Her bolus dose is 3-8 units with meals.  She takes this dose 2-3 times per day.   Following some discussion, we agreed on a plan to reduce lantus from 22 to 18 units once daily in the AM.  We also agreed to increase lowest dose of prandial insulin from 3 to 5.  Her sliding scale for her Novolog will now be 5-8 units prior to meals.   Our short-term goal is to minimize/all low readings to < 5% of readings.  Additionally we are trying to mimimize high readings > 250mg /dl.   I plan to follow-up by phone in 7-10 days.   Mother reported issue with insurance/prescription coverage.  I agreed to contact her pharmacy and determine status/attempt to resolve.   Requested patient/mother contact [employer] directly to request replacement sensor for the sensor that was inappropriately applied when patient was sick.  They agreed to request an additional sensor.

## 2019-12-25 NOTE — Telephone Encounter (Signed)
Mother calls nurse line requesting help obtaining patients Dexcom 6 supplies for patient. Mother stated it was never approved by Medicaid and they were "given" one by the pharmacy. Mother states they were only given "a few" and have now ran out. Mother states the patient does so much better using Dexcom verses having to prick her finger. I see [place] note. Unsure if I need to submit Dexom to Medicaid or if she is able to obtain a new sensor through company.

## 2019-12-25 NOTE — Telephone Encounter (Signed)
Agree 

## 2019-12-26 ENCOUNTER — Ambulatory Visit: Payer: Self-pay | Admitting: Licensed Clinical Social Worker

## 2019-12-26 NOTE — Chronic Care Management (AMB) (Signed)
  Social Work Care Management  Unsuccessful Phone Outreach   12/26/2019 Name: Sabrina Sutton MRN: [id number] DOB: 1996/02/19  Reason for referral : Care Coordination (connect to counseling) Sabrina Sutton is a 24 y.o. year old female who sees Rory Percy, [place] for primary care.  LCSW received referral to assist patient with connecting to resources for counseling .  LCSW called patient to assess needs and barriers reference the above referral. Telephone outreach was unsuccessful. A HIPPA compliant phone message was left for the patient providing contact information and requesting a return call.  Plan:  If no return call is received. LCSW will call again in 5 to 7 days.  Review of patient status, including review of consultants reports, relevant laboratory and other test results, and collaboration with appropriate care team members and the patient's provider was performed as part of comprehensive patient evaluation and provision of care management services.   [name], [place] / [place]   [phone] 1:42 PM

## 2019-12-26 NOTE — Telephone Encounter (Signed)
Patient arrived at 3:30 PM (12/25/2019)and was provided additional sensor and transmitter while prescription authorization is in process.   Sensor/transmitter placed and appeared to be working while in office.    Plan follow-up for blood glucose control in 7-10 days.

## 2019-12-29 IMAGING — CT CT ABD-PELV W/ CM
2 of 5 series · 16 of 46 positions shown, 18 images · IV contrast (ISOVUE)
Comparison: None.

CLINICAL DATA: Acute right-sided abdominal pain.

EXAM:
CT ABDOMEN AND PELVIS WITH CONTRAST
TECHNIQUE: Multidetector CT imaging of the abdomen and pelvis was performed
using the standard protocol following bolus administration of
intravenous contrast.
CONTRAST:  100mL JUNOGP-JDD IOPAMIDOL (JUNOGP-JDD) INJECTION 61%

[Series 2: axial st · axial · 0.64mm/px · z∈[-482,-102]mm · 13 of 86 slices shown, 15 images]
[im 5/86  soft-tissue]
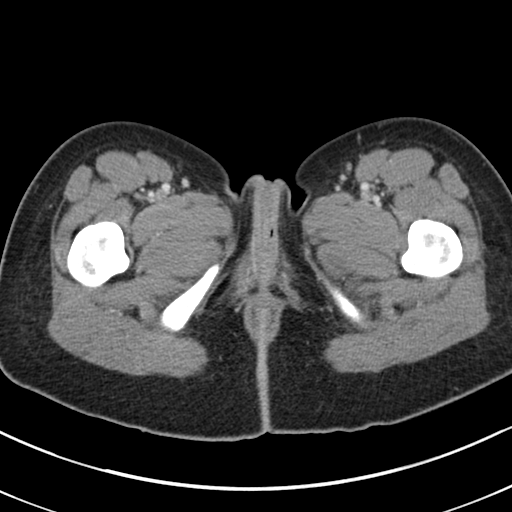
[im 5/86  bone]
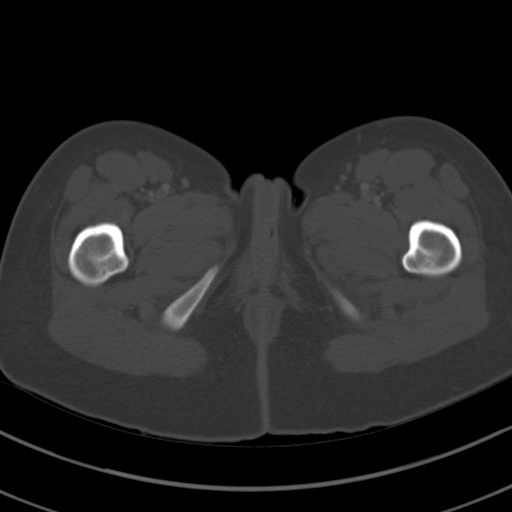
[im 13/86  soft-tissue]
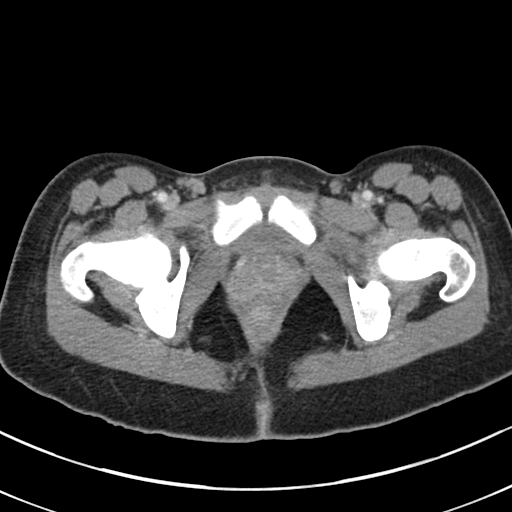
[im 18/86  soft-tissue]
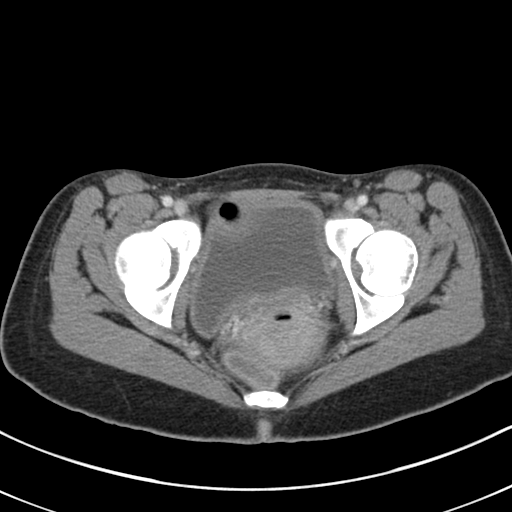
[im 26/86  soft-tissue]
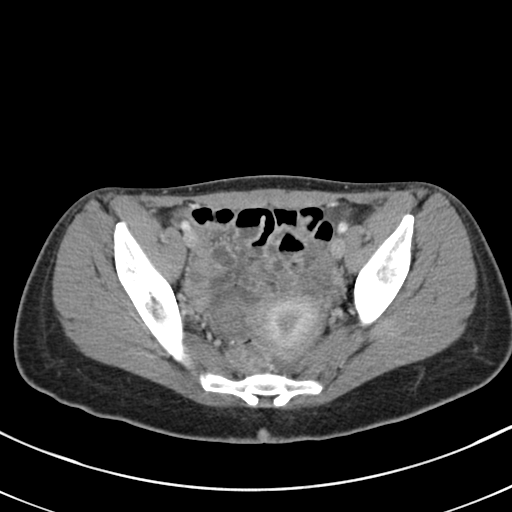
[im 30/86  soft-tissue]
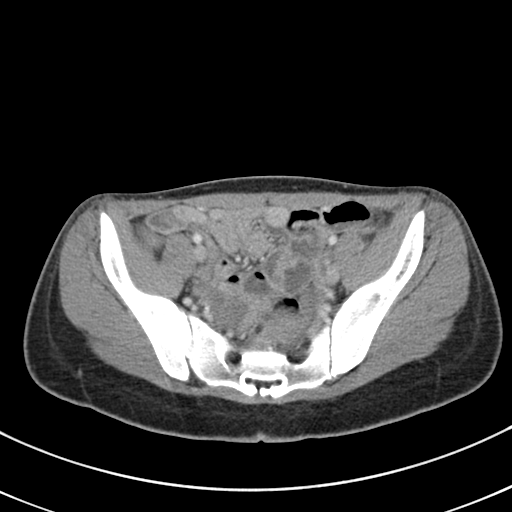
[im 39/86  soft-tissue]
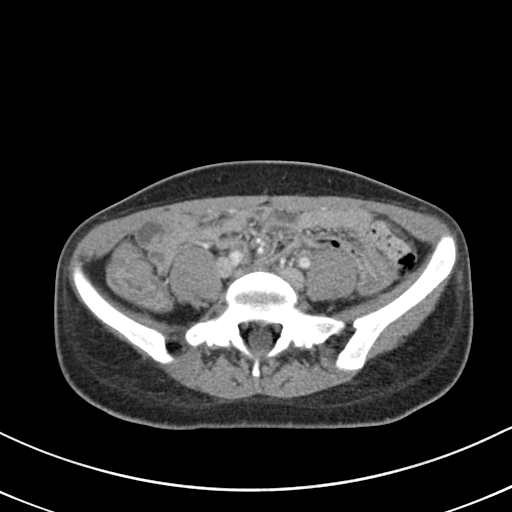
[im 43/86  soft-tissue]
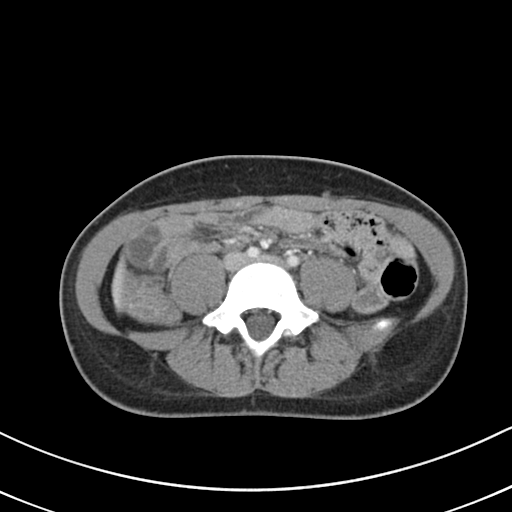
[im 47/86  soft-tissue]
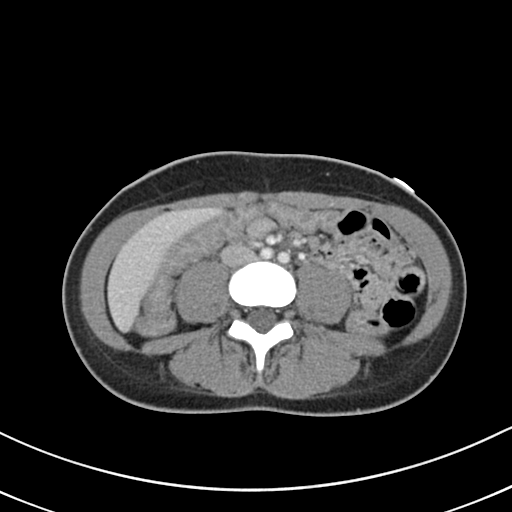
[im 56/86  soft-tissue]
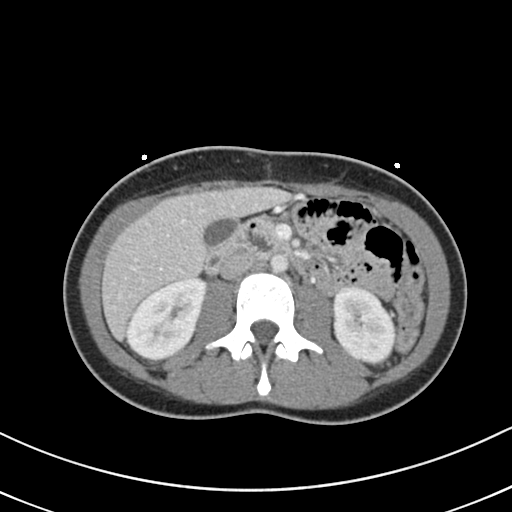
[im 56/86  bone]
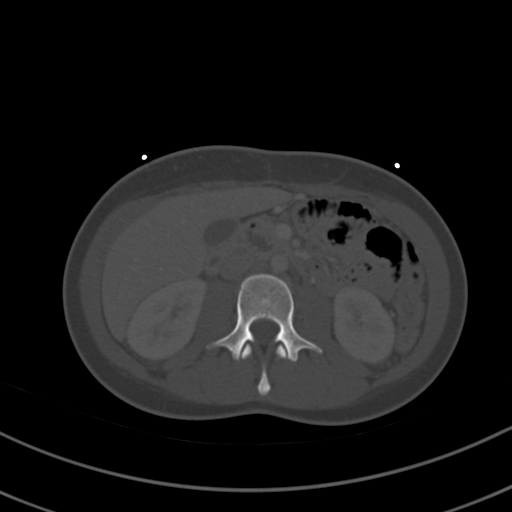
[im 60/86  soft-tissue]
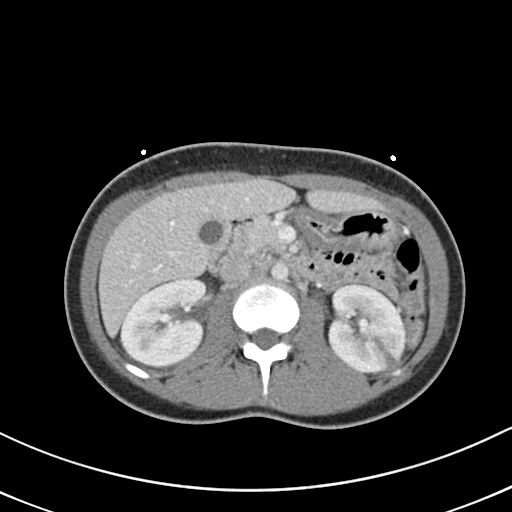
[im 69/86  soft-tissue]
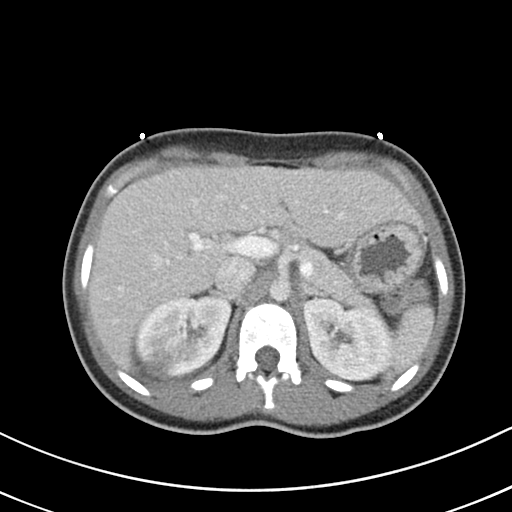
[im 73/86  soft-tissue]
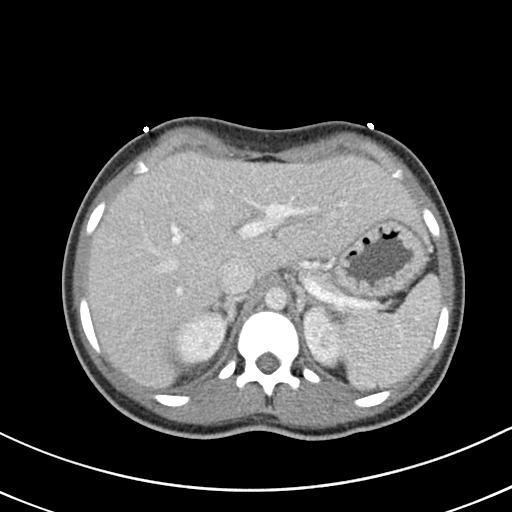
[im 81/86  soft-tissue]
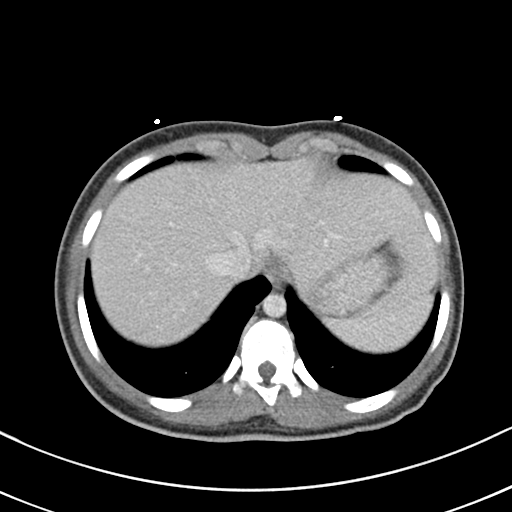

[Series 6: coronal st · coronal · 0.68mm/px · 3 of 69 slices shown]
[im 23/69  soft-tissue]
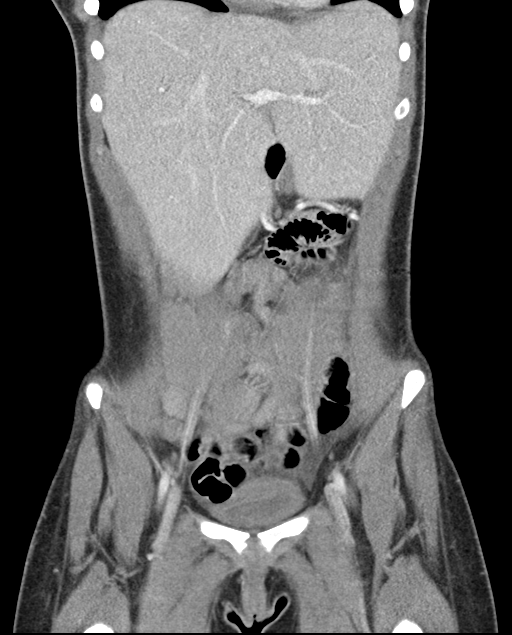
[im 31/69  soft-tissue]
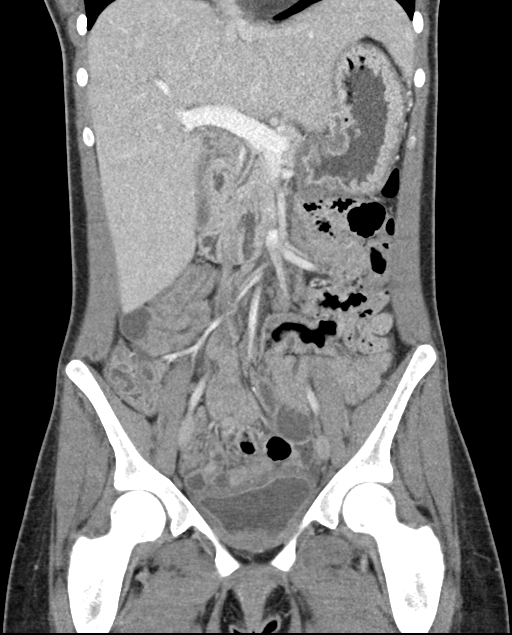
[im 38/69  soft-tissue]
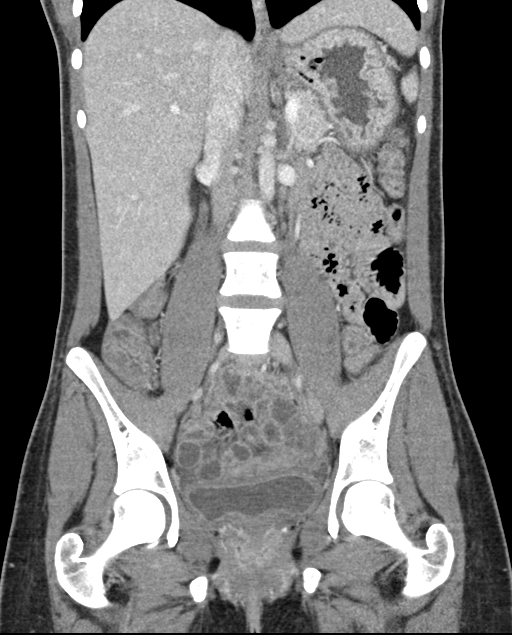

[16 of 46 positions shown; findings below may reference images not displayed]

FINDINGS: Lower chest: No acute abnormality.

Hepatobiliary: No focal liver abnormality is seen. No gallstones,
gallbladder wall thickening, or biliary dilatation.

Pancreas: Unremarkable. No pancreatic ductal dilatation or
surrounding inflammatory changes.

Spleen: Normal in size without focal abnormality.

Adrenals/Urinary Tract: Adrenal glands appear normal. 3.4 x 2.8 cm
heterogeneously enhancing mass is seen in the posterior portion of
the midpole of the right kidney concerning for neoplasm or
malignancy. Left kidney demonstrates wedge-shaped low density most
consistent with cyst, but other pathology can not be excluded. No
hydronephrosis or renal obstruction is noted. Urinary bladder is
unremarkable. No renal or ureteral calculi are noted.

Stomach/Bowel: Stomach is within normal limits. Appendix appears
normal. No evidence of bowel wall thickening, distention, or
inflammatory changes.

Vascular/Lymphatic: No significant vascular findings are present. No
enlarged abdominal or pelvic lymph nodes.

Reproductive: Uterus and left adnexal regions are unremarkable.
cm right ovarian cyst is noted.

Other: No abdominal wall hernia or abnormality. No abdominopelvic
ascites.

Musculoskeletal: No acute or significant osseous findings.
IMPRESSION: 3.4 cm heterogeneously enhancing mass seen in the right kidney
concerning for probable renal cell carcinoma. Abscess is less
likely. Further evaluation with MRI and urologic consult is
recommended. These results were called by telephone at the time of
interpretation on 08/21/2018 at [DATE] to Dr. EMILIE LAURE BLESS , who
verbally acknowledged these results.

Wedge-shaped low density seen in the left kidney which may represent
cyst, but other pathology can not be excluded. Further evaluation
with ultrasound is recommended.

3.2 cm right ovarian cyst.

## 2020-01-01 ENCOUNTER — Telehealth: Payer: Medicaid Other

## 2020-01-01 ENCOUNTER — Telehealth: Payer: Self-pay | Admitting: Pharmacist

## 2020-01-01 ENCOUNTER — Other Ambulatory Visit: Payer: Self-pay

## 2020-01-01 ENCOUNTER — Ambulatory Visit: Payer: Self-pay | Admitting: Licensed Clinical Social Worker

## 2020-01-01 NOTE — Chronic Care Management (AMB) (Signed)
   Social Work Care Management  2nd Unsuccessful  Phone Outreach   01/01/2020 Name: Sabrina Sutton MRN: [id number] DOB: 04/15/1996  Referred by:, Dr. Ouida Sills Reason for referral : Care Coordination (connect with counseling)   Sabrina Sutton is a 24 y.o. year old female who sees Rory Percy, [place] for primary care. 2nd unsuccessful telephone outreach attempt to [name] today.  A HIPPA compliant phone message was left for the patient providing contact information and requesting a return call.  Plan: LCSW will wait for return call, if no return call is received, will reach out to [name] again over the next 7 days. If unable to reach [name] by phone on the 3rd attempt, will discontinue outreach calls but will be available at any time to provide services to [name].   Review of patient status, including review of consultants reports, relevant laboratory and other test results, and collaboration with appropriate care team members and the patient's provider was performed as part of comprehensive patient evaluation and provision of care management services.   [name], [place] / [place]   [phone] 10:13 AM

## 2020-01-01 NOTE — Telephone Encounter (Signed)
Phone call to follow-up on blood glucose control.   Patient reports that she believes her blood sugars are under much better control.  However, review of her CGMs through the "[employer]ex[employer] Clarity"  Shows highly variable readings.  Of note, her low readings are much less with < 10% of her readings being low or very low (both 4%).   She continues to take 18 units of lantus and Novolog is 2-5 units prior to meals.   Encouraged patient to increase her Novolog, as discussed at last contact from 2-5 to 5-8 prior to meals.  Patient does admit to missing mealtime insulin a few times per week.   Patient plans follow-up with provider, Dr. Ouida Sills on 6/14.   Please review control, adherence, diet and exercise plan at that time.

## 2020-01-01 NOTE — Telephone Encounter (Signed)
Reviewed and agree.

## 2020-01-05 ENCOUNTER — Other Ambulatory Visit: Payer: Self-pay

## 2020-01-05 ENCOUNTER — Telehealth (INDEPENDENT_AMBULATORY_CARE_PROVIDER_SITE_OTHER): Payer: Medicaid Other | Admitting: Family Medicine

## 2020-01-05 DIAGNOSIS — F339 Major depressive disorder, recurrent, unspecified: Secondary | ICD-10-CM

## 2020-01-05 DIAGNOSIS — E108 Type 1 diabetes mellitus with unspecified complications: Secondary | ICD-10-CM

## 2020-01-05 DIAGNOSIS — F12188 Cannabis abuse with other cannabis-induced disorder: Secondary | ICD-10-CM | POA: Diagnosis not present

## 2020-01-05 NOTE — Progress Notes (Signed)
[facility] Telemedicine Visit  Patient consented to have virtual visit and was identified by name and date of birth. Method of visit: Video was attempted, but technology challenges prevented patient from using video, so visit was conducted via telephone.  Encounter participants: Patient: Sabrina Sutton - located at [facility] Provider: Daisy Floro - located at Clinic Others (if applicable): None  Chief Complaint: F/u N, V, Diabetes check in  HPI:  Follow up for Cannabinoid Hyperemesis: patient was seen in the ED for intractable nausea and vomiting, it was suggested this was due to her persistent marijuana use. Patient followed up in the clinic virtually for the emesis and made the goal to decrease her smoking habits. She elected to smoke no more than 2-3 times daily (used to smoke "all the time). She has done such a great job decreasing her smoking habits and reports she has cut back to only smoking twice daily! She denies any nausea or vomiting, states she feels much better.   Depression: patient was also evaluated for elevated PHQ score at her last visit, answered "0" on question 9, however she felt that she had some depression since around the time she received the diagnosis of Type 1 Diabetes. She has never seen a counselor or been medically treated for depression before. She reports that she has not yet found a counselor, was informed of the list of therapists sent to her in her last AVS, which she says she can take a look at. I offered our assistance in helping her find counseling.  T1DM:  Blood sugars have been more level, in the AM 90-100, in the evening they go up to about 200 but don't go higher than that. She is currently taking Lantus 18u BID in the AM and PM and Novolog 8 between meals. She was recently cut back from Lantus 22 units twice daily as this was causing her to have decreased blood sugars. She has a Dexcom when she first started using it she was  waking up hypoglycemic and really shaky in the morning but says this hasn't happened in a while. She would like to continue her current regimen. Last A1c in April 2021 was 9.4%.  ROS: per HPI  Pertinent PMHx:  Type 1 DM Mild Depression Marijuana Use  Exam:  There were no vitals taken for this visit.  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

## 2020-01-05 NOTE — Assessment & Plan Note (Signed)
Symptoms resolved, has cut back on marijuana smoking, down to 2 joints daily and feeling much better -Encouraged further smoking cessation -Follow up at next appt

## 2020-01-05 NOTE — Assessment & Plan Note (Signed)
Last PHQ slightly elevated to mild range -Find counselor -No meds yet -Provided resources in 5/28 AVS for therapists

## 2020-01-05 NOTE — Assessment & Plan Note (Signed)
Doing well on current regimen -Lantus 18u BID -Novolog 8 between meals, snacks -Repeat A1c in 4-6 weeks at f/u appt

## 2020-01-08 ENCOUNTER — Other Ambulatory Visit: Payer: Self-pay

## 2020-01-08 ENCOUNTER — Ambulatory Visit: Payer: Medicaid Other | Admitting: Licensed Clinical Social Worker

## 2020-01-08 DIAGNOSIS — Z7189 Other specified counseling: Secondary | ICD-10-CM

## 2020-01-08 DIAGNOSIS — Z139 Encounter for screening, unspecified: Secondary | ICD-10-CM

## 2020-01-08 NOTE — Chronic Care Management (AMB) (Signed)
Care Management   Clinical Social Work initial Note  01/08/2020 Name: Sabrina Sutton MRN: [id number] DOB: [birth year] Sabrina Sutton is a 24 y.o. year old female who sees Rory Percy, [place] for primary care. The Care Management team was consulted by Dr. Ouida Sills to assist the patient connecting for ongoing counseling.  LCSW reached out to Sabrina Sutton today by phone to introduce self, assess needs and barriers to care.   Assessment: Patient is pleasant and engaged in conversation.  Experiencing  symptoms of depression.  She has not had previous counseling.  Recommendation: Patient may benefit from, and is in agreement to seek ongoing counseling to assist with managing her symptoms  Plan:  1. review information and resources provided by LCSW via Sabrina Sutton 2.  LCSW will F/U in 3 to 5 days  Review of patient status, including review of consultants reports, relevant laboratory and other test results, and collaboration with appropriate care team members and the patient's provider was performed as part of comprehensive patient evaluation and provision of chronic care management services.    Advance Directive Status:. Not addressed in this encounter SDOH (Social Determinants of Health) assessments performed: Yes:  SDOH Interventions     Most Recent Value  SDOH Interventions  SDOH Interventions for the Following Domains Depression  Depression Interventions/Treatment  Counseling  [counseling resources via [email or web address]]      ; Goals Addressed            This Visit's Progress   . connect with counseling       CARE PLAN ENTRY (see longitudinal plan of care for additional care plan information)  Current Barriers:  . Patient with Depression acknowledges deficits with connecting to mental health provider for ongoing counseling  . Patient needs Support, Education, and Care Coordination in order to meet unmet mental health needs . Patient had list of counselor from provider  visit has not called.  Clinical Social Work [employer]):  Marland Kitchen Over the next 30 days, patient will work with SW to connect to providers for ongoing counseling .  Marland Kitchen Patient will implement clinical interventions discussed today to decreases symptoms of depression and increase knowledge and/or ability of: coping skills. Interventions:  . Assessed patient's  previous treatment, needs and barriers to care . Provided basic mental health support, education and interventions ( Sent EMMI education on Depression) . Discussed several options for long term counseling based on need and insurance.  Sent patient several agencies she could review on her phone . Other interventions include: Motivational Interviewing and solution focused  Patient Self Care Activities & Deficits:  . Patient is unable to independently navigate community resource options without care coordination support . Patient is able to implement clinical interventions discussed today and is motivated for treatment  . Patient will select one of the agencies from the list provided and call to schedule an appointment  . Patient is motivated for treatment Initial goal documentation       Outpatient Encounter Medications as of 01/08/2020  Medication Sig  . ACCU-CHEK AVIVA PLUS test strip USE AS DIRECTED  . Accu-Chek FastClix Lancets MISC Check sugar 10 x daily  . Alcohol Swabs (ALCOHOL PADS) 70 % PADS Use to wipe skin prior to insulin injections twice daily  . blood glucose meter kit and supplies Dispense based on patient and insurance preference. Use up to four times daily as directed. (FOR ICD-10 E10.9, E11.9).  Marland Kitchen Blood Glucose Monitoring Suppl (ACCU-CHEK GUIDE ME) w/Device KIT 1 kit by Does not apply route  as directed.  . Continuous Blood Gluc Receiver (DEXCOM G6 RECEIVER) DEVI 1 Device by Does not apply route as directed.  . Continuous Blood Gluc Sensor (DEXCOM G6 SENSOR) MISC Inject 1 applicator into the skin as directed. (change sensor every 10  days)  . Continuous Blood Gluc Transmit (DEXCOM G6 TRANSMITTER) MISC Inject 1 Device into the skin as directed. (re-use up to 8x with each new sensor)  . Glucagon (BAQSIMI TWO PACK) 3 MG/DOSE POWD Place 1 spray into the nose as directed.  Marland Kitchen glucose blood (ACCU-CHEK AVIVA PLUS) test strip PLEASE CHECK BLOOD SUGAR FOUR TIMES DAILY  . insulin glargine (LANTUS SOLOSTAR) 100 UNIT/ML Solostar Pen Inject 18 Units into the skin every morning.  . Insulin Pen Needle (B-D UF III MINI PEN NEEDLES) 31G X 5 MM MISC USE TO CHECK BLOOD SUGAR IN THE MORNING BEFORE EATING, BEFORE EACH MEAL, AND AS NEEDED  . Insulin Pen Needle (B-D UF III MINI PEN NEEDLES) 31G X 5 MM MISC CHECK BLOOD SUGAR IN THE MORNING BEFORE EATING, BEFORE EACH MEAL, AND AS NEEDED  . Lancets (ACCU-CHEK SOFT TOUCH) lancets Use as directed.  . norgestimate-ethinyl estradiol (ORTHO-CYCLEN) 0.25-35 MG-MCG tablet Take 1 tablet by mouth daily. (Patient not taking: Reported on 11/20/2019)  . NOVOLOG FLEXPEN 100 UNIT/ML FlexPen Inject 3-8 Units into the skin 3 (three) times daily.  . ondansetron (ZOFRAN-ODT) 8 MG disintegrating tablet Take 1 tablet (8 mg total) by mouth every 8 (eight) hours as needed for nausea or vomiting.   No facility-administered encounter medications on file as of 01/08/2020.       Information about Care Management services was shared with [name] today including:  1. Care Management services include personalized support from designated clinical staff supervised by her physician, including individualized plan of care and coordination with other care providers 2. Remind patient of 24/7 contact phone numbers to provider's office for assistance with urgent and routine care needs. 3. Care Management services are voluntary and patient may stop at any time .  Patient agreed to services provided today and verbal consent obtained.      Sabrina Sutton, [place] / Sabrina Sutton   [phone] 3:14 PM

## 2020-01-13 ENCOUNTER — Telehealth: Payer: Medicaid Other

## 2020-01-13 ENCOUNTER — Other Ambulatory Visit: Payer: Self-pay

## 2020-01-13 ENCOUNTER — Ambulatory Visit: Payer: Self-pay | Admitting: Licensed Clinical Social Worker

## 2020-01-13 NOTE — Chronic Care Management (AMB) (Signed)
  Social Work Care Management  Unsuccessful Phone Outreach   01/13/2020 Name: Sabrina Sutton MRN: [id number] DOB: December 25, 1995 Reason for referral : Care Coordination (counseling resources)   Sabrina Sutton is a 24 y.o. year old female who sees Rory Percy, [place] for primary care.  LCSW received referral to assist patient with connecting to counseling resources .  LCSW called patient to assess needs and barriers reference the above referral. Telephone outreach was unsuccessful. Unable to leave a HIPPA compliant phone message  requesting a return call due to voice mail not being set up.  Plan: Will call again in 3 to 5 days  Review of patient status, including review of consultants reports, relevant laboratory and other test results, and collaboration with appropriate care team members and the patient's provider was performed as part of comprehensive patient evaluation and provision of care management services.   [name], [place] / [place]   [phone] 2:04 PM

## 2020-01-14 ENCOUNTER — Encounter: Payer: Self-pay | Admitting: Family Medicine

## 2020-01-16 ENCOUNTER — Ambulatory Visit: Payer: Self-pay | Admitting: Licensed Clinical Social Worker

## 2020-01-16 ENCOUNTER — Telehealth: Payer: Self-pay | Admitting: Pharmacist

## 2020-01-16 NOTE — Chronic Care Management (AMB) (Signed)
   Social Work Care Management  2nd Unsuccessful  Phone Outreach   01/16/2020 Name: Sabrina Sutton MRN: [id number] DOB: [birth year]  Referred by: Rory Percy, DO ,  Reason for referral : Care Coordination (outreach attempt) to assist patient with connecting to counseling resources.   [name] is a 24 y.o. year old female who sees Rory Percy, [place] for primary care. 2nd unsuccessful telephone outreach attempt to Ms. [name] today. Unable to leave a HIPPA compliant phone message due to voice mail not set up.  Plan: LCSW will wait for return call, if no return call is received, will reach out to Ms. [name] again over the next 7 days. If unable to reach [name] by phone on the 3rd attempt, will discontinue outreach calls but will be available at any time to provide services to [name].   Review of patient status, including review of consultants reports, relevant laboratory and other test results, and collaboration with appropriate care team members and the patient's provider was performed as part of comprehensive patient evaluation and provision of care management services.   [name], [place] / [place]   [phone] 10:17 AM

## 2020-01-16 NOTE — Telephone Encounter (Signed)
Called patient on 01/16/2020 at 12:56 PM. Unable to leave HIPAA-compliant VM with instructions to call [facility] clinic back considering VM box has not been set up yet.   Plan to discuss DM management. Given end of residency approaching will be unable to follow up with patient again. Will defer further management and follow up to the expertise of Dr. Valentina Lucks.  Thank you for involving pharmacy to assist in providing this patient's care.   Drexel Iha, PharmD PGY2 Ambulatory Care Pharmacy Resident

## 2020-01-17 ENCOUNTER — Encounter (HOSPITAL_COMMUNITY): Payer: Self-pay

## 2020-01-17 ENCOUNTER — Other Ambulatory Visit: Payer: Self-pay

## 2020-01-17 ENCOUNTER — Other Ambulatory Visit: Payer: Self-pay | Admitting: Family Medicine

## 2020-01-17 ENCOUNTER — Emergency Department (HOSPITAL_COMMUNITY)
Admission: RE | Admit: 2020-01-17 | Discharge: 2020-01-17 | Discharge status: Against Medical Advice | Payer: Medicaid Other | Attending: Family Medicine | Admitting: Family Medicine

## 2020-01-17 DIAGNOSIS — R1084 Generalized abdominal pain: Secondary | ICD-10-CM | POA: Insufficient documentation

## 2020-01-17 DIAGNOSIS — E1010 Type 1 diabetes mellitus with ketoacidosis without coma: Secondary | ICD-10-CM | POA: Insufficient documentation

## 2020-01-17 DIAGNOSIS — R112 Nausea with vomiting, unspecified: Secondary | ICD-10-CM | POA: Diagnosis present

## 2020-01-17 DIAGNOSIS — F17210 Nicotine dependence, cigarettes, uncomplicated: Secondary | ICD-10-CM | POA: Insufficient documentation

## 2020-01-17 DIAGNOSIS — Z794 Long term (current) use of insulin: Secondary | ICD-10-CM | POA: Diagnosis not present

## 2020-01-17 DIAGNOSIS — Z20822 Contact with and (suspected) exposure to covid-19: Secondary | ICD-10-CM | POA: Diagnosis not present

## 2020-01-17 DIAGNOSIS — E876 Hypokalemia: Secondary | ICD-10-CM | POA: Insufficient documentation

## 2020-01-17 DIAGNOSIS — Z03818 Encounter for observation for suspected exposure to other biological agents ruled out: Secondary | ICD-10-CM | POA: Diagnosis not present

## 2020-01-17 HISTORY — DX: Type 1 diabetes mellitus with ketoacidosis without coma: E10.10

## 2020-01-17 LAB — BASIC METABOLIC PANEL
Anion gap: 23 — ABNORMAL HIGH (ref 5–15)
Anion gap: 14 (ref 5–15)
BUN: 15 mg/dL (ref 6–20)
BUN: 11 mg/dL (ref 6–20)
CO2: 20 mmol/L — ABNORMAL LOW (ref 22–32)
CO2: 22 mmol/L (ref 22–32)
Calcium: 10.3 mg/dL (ref 8.9–10.3)
Calcium: 8.3 mg/dL — ABNORMAL LOW (ref 8.9–10.3)
Chloride: 88 mmol/L — ABNORMAL LOW (ref 98–111)
Chloride: 93 mmol/L — ABNORMAL LOW (ref 98–111)
Creatinine, Ser: 1.1 mg/dL — ABNORMAL HIGH (ref 0.44–1.00)
Creatinine, Ser: 0.89 mg/dL (ref 0.44–1.00)
GFR calc Af Amer: >60 mL/min (ref >60)
GFR calc Af Amer: >60 mL/min (ref >60)
GFR calc non Af Amer: >60 mL/min (ref >60)
GFR calc non Af Amer: >60 mL/min (ref >60)
Glucose, Bld: 300 mg/dL — ABNORMAL HIGH (ref 70–99)
Glucose, Bld: 273 mg/dL — ABNORMAL HIGH (ref 70–99)
Potassium: 2.9 mmol/L — ABNORMAL LOW (ref 3.5–5.1)
Potassium: 3.2 mmol/L — ABNORMAL LOW (ref 3.5–5.1)
Sodium: 131 mmol/L — ABNORMAL LOW (ref 135–145)
Sodium: 129 mmol/L — ABNORMAL LOW (ref 135–145)

## 2020-01-17 LAB — CBC
HCT: 46.1 % — ABNORMAL HIGH (ref 36.0–46.0)
HCT: 35 % — ABNORMAL LOW (ref 36.0–46.0)
Hemoglobin: 15.6 g/dL — ABNORMAL HIGH (ref 12.0–15.0)
Hemoglobin: 11.9 g/dL — ABNORMAL LOW (ref 12.0–15.0)
MCH: 29 pg (ref 26.0–34.0)
MCH: 28.9 pg (ref 26.0–34.0)
MCHC: 33.8 g/dL (ref 30.0–36.0)
MCHC: 34 g/dL (ref 30.0–36.0)
MCV: 85.7 fL (ref 80.0–100.0)
MCV: 85 fL (ref 80.0–100.0)
Platelets: 492 10*3/uL — ABNORMAL HIGH (ref 150–400)
Platelets: 415 10*3/uL — ABNORMAL HIGH (ref 150–400)
RBC: 5.38 MIL/uL — ABNORMAL HIGH (ref 3.87–5.11)
RBC: 4.12 MIL/uL (ref 3.87–5.11)
RDW: 13.4 % (ref 11.5–15.5)
RDW: 13.2 % (ref 11.5–15.5)
WBC: 12.1 10*3/uL — ABNORMAL HIGH (ref 4.0–10.5)
WBC: 12 10*3/uL — ABNORMAL HIGH (ref 4.0–10.5)
nRBC: 0 % (ref 0.0–0.2)
nRBC: 0 % (ref 0.0–0.2)

## 2020-01-17 LAB — URINALYSIS, ROUTINE W REFLEX MICROSCOPIC
Bilirubin Urine: NEGATIVE
Glucose, UA: >=500 mg/dL — AB
Hgb urine dipstick: NEGATIVE
Ketones, ur: 80 mg/dL — AB
Nitrite: NEGATIVE
Protein, ur: 30 mg/dL — AB
Specific Gravity, Urine: 1.026 (ref 1.005–1.030)
pH: 5 (ref 5.0–8.0)

## 2020-01-17 LAB — CBG MONITORING, ED
Glucose-Capillary: 309 mg/dL — ABNORMAL HIGH (ref 70–99)
Glucose-Capillary: 104 mg/dL — ABNORMAL HIGH (ref 70–99)
Glucose-Capillary: 55 mg/dL — ABNORMAL LOW (ref 70–99)
Glucose-Capillary: 44 mg/dL — CL (ref 70–99)
Glucose-Capillary: 177 mg/dL — ABNORMAL HIGH (ref 70–99)

## 2020-01-17 LAB — I-STAT BETA HCG BLOOD, ED (MC, WL, AP ONLY): I-stat hCG, quantitative: <5 m[IU]/mL (ref <5)

## 2020-01-17 LAB — BETA-HYDROXYBUTYRIC ACID
Beta-Hydroxybutyric Acid: 3.45 mmol/L — ABNORMAL HIGH (ref 0.05–0.27)
Beta-Hydroxybutyric Acid: 4.2 mmol/L — ABNORMAL HIGH (ref 0.05–0.27)

## 2020-01-17 LAB — MAGNESIUM: Magnesium: 1.9 mg/dL (ref 1.7–2.4)

## 2020-01-17 LAB — SARS CORONAVIRUS 2 BY RT PCR (HOSPITAL ORDER, PERFORMED IN ~~LOC~~ HOSPITAL LAB): SARS Coronavirus 2: NEGATIVE

## 2020-01-17 MED ORDER — SODIUM CHLORIDE 0.9 % IV BOLUS
1000.0000 mL | Freq: Once | INTRAVENOUS | Status: AC
  Administered 2020-01-17: 1000 mL via INTRAVENOUS

## 2020-01-17 MED ORDER — INSULIN REGULAR(HUMAN) IN NACL 100-0.9 UT/100ML-% IV SOLN: INTRAVENOUS | Status: DC

## 2020-01-17 MED ORDER — DEXTROSE 50 % IV SOLN
INTRAVENOUS | Status: AC
  Filled 2020-01-17: qty 50

## 2020-01-17 MED ORDER — SODIUM CHLORIDE 0.9 % IV SOLN: INTRAVENOUS | Status: DC

## 2020-01-17 MED ORDER — ACETAMINOPHEN 325 MG PO TABS
650.0000 mg | ORAL_TABLET | Freq: Once | ORAL | Status: AC
  Administered 2020-01-17: 650 mg via ORAL
  Filled 2020-01-17: qty 2

## 2020-01-17 MED ORDER — DEXTROSE 50 % IV SOLN
0.0000 mL | INTRAVENOUS | Status: DC | PRN
  Administered 2020-01-17: 50 mL via INTRAVENOUS

## 2020-01-17 MED ORDER — POTASSIUM CHLORIDE CRYS ER 20 MEQ PO TBCR
40.0000 meq | EXTENDED_RELEASE_TABLET | ORAL | Status: DC
  Administered 2020-01-17: 40 meq via ORAL
  Filled 2020-01-17: qty 2

## 2020-01-17 MED ORDER — ONDANSETRON 8 MG PO TBDP: 8.0000 mg | ORAL_TABLET | Freq: Three times a day (TID) | ORAL | 0 refills | Status: DC | PRN

## 2020-01-17 MED ORDER — NORGESTIMATE-ETH ESTRADIOL 0.25-35 MG-MCG PO TABS: 1.0000 | ORAL_TABLET | Freq: Every day | ORAL | Status: DC

## 2020-01-17 MED ORDER — POTASSIUM CHLORIDE CRYS ER 20 MEQ PO TBCR
40.0000 meq | EXTENDED_RELEASE_TABLET | Freq: Once | ORAL | Status: AC
  Administered 2020-01-17: 40 meq via ORAL
  Filled 2020-01-17: qty 2

## 2020-01-17 MED ORDER — POTASSIUM CHLORIDE 10 MEQ/100ML IV SOLN
10.0000 meq | INTRAVENOUS | Status: DC
  Filled 2020-01-17: qty 100

## 2020-01-17 MED ORDER — DEXTROSE-NACL 5-0.45 % IV SOLN: INTRAVENOUS | Status: DC

## 2020-01-17 MED ORDER — ENOXAPARIN SODIUM 40 MG/0.4ML ~~LOC~~ SOLN: 40.0000 mg | SUBCUTANEOUS | Status: DC

## 2020-01-17 MED ORDER — ONDANSETRON HCL 4 MG/2ML IJ SOLN
4.0000 mg | Freq: Once | INTRAMUSCULAR | Status: AC
  Administered 2020-01-17: 4 mg via INTRAVENOUS
  Filled 2020-01-17: qty 2

## 2020-01-17 MED ORDER — ACETAMINOPHEN 325 MG PO TABS: 650.0000 mg | ORAL_TABLET | Freq: Four times a day (QID) | ORAL | Status: DC | PRN

## 2020-01-17 MED ORDER — MAGNESIUM SULFATE 2 GM/50ML IV SOLN
2.0000 g | Freq: Once | INTRAVENOUS | Status: AC
  Administered 2020-01-17: 2 g via INTRAVENOUS
  Filled 2020-01-17: qty 50

## 2020-01-17 MED ORDER — POTASSIUM CHLORIDE 10 MEQ/100ML IV SOLN
10.0000 meq | INTRAVENOUS | Status: AC
  Administered 2020-01-17 (×3): 10 meq via INTRAVENOUS
  Filled 2020-01-17 (×3): qty 100

## 2020-01-17 MED ORDER — ONDANSETRON 4 MG PO TBDP
8.0000 mg | ORAL_TABLET | Freq: Three times a day (TID) | ORAL | Status: DC | PRN
  Filled 2020-01-17: qty 2

## 2020-01-17 NOTE — ED Provider Notes (Addendum)
[facility] EMERGENCY DEPARTMENT Provider Note   CSN: [id number] Arrival date & time: 01/17/20  1410     History No chief complaint on file.   Sabrina Sutton is a 24 y.o. female with insulin-dependent DM, DKA, and cannabinoid induced hyperemesis syndrome who presents to the ED with a 3-day history of abdominal pain, nausea and vomiting, and hyperglycemia.  Patient is accompanied by her mother who reports that she has been making an effort to keep patient hydrated.  She recently had the Dexcom CGM monitor placed which has been helping her monitor her hyperglycemia which has been ranging between 3-400.  They have had numerous episodes of nonbloody emesis.  Her abdominal discomfort is crampy and migratory.  She denies any fevers or chills, cough, shortness of breath or chest pain, recent infection, dysuria, increased urinary frequency, change in bowel habits, hematemesis, sore throat, or other symptoms.  She does continue to endorse marijuana use and understands that it can precipitate bouts of vomiting.  HPI     Past Medical History:  Diagnosis Date   Diabetes mellitus without complication ([place]) 7/32/20   + GAD Ab   DKA (diabetic ketoacidoses) ([place]) 09/20/2015   Hypokalemia    Pyelonephritis 04/17/2016    Patient Active Problem List   Diagnosis Date Noted   Depression, recurrent ([place]) 12/19/2019   Cannabis hyperemesis syndrome concurrent with and due to cannabis abuse ([place]) 12/19/2019   Noncompliance with medication regimen 06/18/2019   Diarrhea 04/11/2019   Contraception management 03/04/2019   RUQ pain    DKA (diabetic ketoacidoses) ([place]) 02/20/2019   Acute pyelonephritis 10/13/2018   Hypokalemia 10/13/2018   Renal mass 08/21/2018   Near syncope 05/03/2018   Condyloma acuminatum of vulva 10/31/2017   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     Family History  Problem Relation Age of Onset   Diabetes Maternal Grandmother    Heart disease Maternal Grandmother        Deceased from MI at age 39   Hypertension Maternal Grandmother    Hypercholesterolemia Mother    Seizures Mother    Kidney Stones Mother    Hyperlipidemia Mother    Stroke Maternal Grandfather        Deceased from stroke at age 56   Hypertension Paternal Grandmother    Healthy Father     Social History   Tobacco Use   Smoking status: Current Every Day Smoker    Packs/day: 0.25    Types: Cigarettes   Smokeless tobacco: Never Used  Vaping Use   Vaping Use: Never used  Substance Use Topics   Alcohol use: Yes    Comment: Last drank 1 month ago per MD note   Drug use: Yes    Types: Marijuana    Comment: Last used 1  month ago per MD note    Home Medications Prior to Admission medications   Medication Sig Start Date End Date Taking? Authorizing Provider  ACCU-CHEK AVIVA PLUS test strip USE AS DIRECTED 06/18/18   Steve Rattler, DO  Accu-Chek FastClix Lancets MISC Check sugar 10 x daily 02/22/19   Wilber Oliphant, MD  Alcohol Swabs (ALCOHOL PADS) 70 % PADS Use to wipe skin  prior to insulin injections twice daily 02/22/19   Wilber Oliphant, MD  blood glucose meter kit and supplies Dispense based on patient and insurance preference. Use up to four times daily as directed. (FOR ICD-10 E10.9, E11.9). 02/22/19   Wilber Oliphant, MD  Blood Glucose Monitoring Suppl (ACCU-CHEK GUIDE ME) w/Device KIT 1 kit by Does not apply route as directed. 11/20/19   Rory Percy, DO  Continuous Blood Gluc Receiver (DEXCOM G6 RECEIVER) DEVI 1 Device by Does not apply route as directed. 11/15/19   Rory Percy, DO  Continuous Blood Gluc Sensor (DEXCOM G6 SENSOR) MISC Inject 1 applicator into  the skin as directed. (change sensor every 10 days) 11/15/19   Rory Percy, DO  Continuous Blood Gluc Transmit (DEXCOM G6 TRANSMITTER) MISC Inject 1 Device into the skin as directed. (re-use up to 8x with each new sensor) 11/15/19   Rory Percy, DO  Glucagon (BAQSIMI TWO PACK) 3 MG/DOSE POWD Place 1 spray into the nose as directed. 11/20/19   Rory Percy, DO  glucose blood (ACCU-CHEK AVIVA PLUS) test strip PLEASE CHECK BLOOD SUGAR FOUR TIMES DAILY 11/03/19   Rory Percy, DO  insulin glargine (LANTUS SOLOSTAR) 100 UNIT/ML Solostar Pen Inject 18 Units into the skin every morning. 12/25/19   Leavy Cella, RPH-CPP  Insulin Pen Needle (B-D UF III MINI PEN NEEDLES) 31G X 5 MM MISC USE TO CHECK BLOOD SUGAR IN THE MORNING BEFORE EATING, BEFORE EACH MEAL, AND AS NEEDED 02/22/19   Wilber Oliphant, MD  Insulin Pen Needle (B-D UF III MINI PEN NEEDLES) 31G X 5 MM MISC CHECK BLOOD SUGAR IN THE MORNING BEFORE EATING, BEFORE EACH MEAL, AND AS NEEDED 02/22/19   Wilber Oliphant, MD  Lancets (ACCU-CHEK SOFT TOUCH) lancets Use as directed. 10/24/17   Kathrene Alu, MD  norgestimate-ethinyl estradiol (ORTHO-CYCLEN) 0.25-35 MG-MCG tablet Take 1 tablet by mouth daily. Patient not taking: Reported on 11/20/2019 03/04/19   Rory Percy, DO  NOVOLOG FLEXPEN 100 UNIT/ML FlexPen Inject 3-8 Units into the skin 3 (three) times daily. 12/25/19   Leavy Cella, RPH-CPP  ondansetron (ZOFRAN-ODT) 8 MG disintegrating tablet Take 1 tablet (8 mg total) by mouth every 8 (eight) hours as needed for nausea or vomiting. 12/18/19   Jaynee Eagles, PA-C    Allergies    Patient has no known allergies.  Review of Systems   Review of Systems  All other systems reviewed and are negative.   Physical Exam Updated Vital Signs BP 127/88 (BP Location: Right Arm)    Pulse (!) 117    Temp 98.8 F (37.1 C) (Oral)    Resp 17    SpO2 100%   Physical Exam Vitals and nursing note reviewed. Exam conducted with a chaperone present.    Constitutional:      Appearance: She is ill-appearing.     Comments: Nauseated.  HENT:     Head: Normocephalic and atraumatic.     Mouth/Throat:     Comments: Dry mucous membranes. Eyes:     General: No scleral icterus.    Conjunctiva/sclera: Conjunctivae normal.  Cardiovascular:     Rate and Rhythm: Regular rhythm. Tachycardia present.     Pulses: Normal pulses.     Heart sounds: Normal heart sounds.  Pulmonary:     Effort: Pulmonary effort is normal. No respiratory distress.     Breath sounds: Normal breath sounds.  Abdominal:     Comments: Soft, nondistended. No areas of TTP. No guarding.  Musculoskeletal:  Cervical back: Normal range of motion. No rigidity.     Right lower leg: No edema.     Left lower leg: No edema.  Skin:    General: Skin is dry.     Capillary Refill: Capillary refill takes less than 2 seconds.  Neurological:     Mental Status: She is alert and oriented to person, place, and time.     GCS: GCS eye subscore is 4. GCS verbal subscore is 5. GCS motor subscore is 6.  Psychiatric:        Mood and Affect: Mood normal.        Behavior: Behavior normal.        Thought Content: Thought content normal.     ED Results / Procedures / Treatments   Labs (all labs ordered are listed, but only abnormal results are displayed) Labs Reviewed  BASIC METABOLIC PANEL - Abnormal; Notable for the following components:      Result Value   Sodium 131 (*)    Potassium 2.9 (*)    Chloride 88 (*)    CO2 20 (*)    Glucose, Bld 300 (*)    Creatinine, Ser 1.10 (*)    Anion gap 23 (*)    All other components within normal limits  CBC - Abnormal; Notable for the following components:   WBC 12.1 (*)    RBC 5.38 (*)    Hemoglobin 15.6 (*)    HCT 46.1 (*)    Platelets 492 (*)    All other components within normal limits  URINALYSIS, ROUTINE W REFLEX MICROSCOPIC - Abnormal; Notable for the following components:   APPearance HAZY (*)    Glucose, UA >=500 (*)     Ketones, ur 80 (*)    Protein, ur 30 (*)    Leukocytes,Ua MODERATE (*)    Bacteria, UA RARE (*)    All other components within normal limits  CBG MONITORING, ED - Abnormal; Notable for the following components:   Glucose-Capillary 309 (*)    All other components within normal limits  CBG MONITORING, ED - Abnormal; Notable for the following components:   Glucose-Capillary 104 (*)    All other components within normal limits  MAGNESIUM  BETA-HYDROXYBUTYRIC ACID  I-STAT BETA HCG BLOOD, ED (MC, [facility], [facility] ONLY)  CBG MONITORING, ED    EKG None  Radiology No results found.  Procedures .Critical Care Performed by: Corena Herter, PA-C Authorized by: Corena Herter, PA-C   Critical care provider statement:    Critical care time (minutes):  45   Critical care was necessary to treat or prevent imminent or life-threatening deterioration of the following conditions:  Endocrine crisis   Critical care was time spent personally by me on the following activities:  Discussions with consultants, evaluation of patient's response to treatment, examination of patient, ordering and performing treatments and interventions, ordering and review of laboratory studies, ordering and review of radiographic studies, pulse oximetry, re-evaluation of patient's condition, obtaining history from patient or surrogate and review of old charts Comments:     DKA   (including critical care time)  Medications Ordered in ED Medications  potassium chloride 10 mEq in 100 mL IVPB (10 mEq Intravenous New Bag/Given 01/17/20 1958)  magnesium sulfate IVPB 2 g 50 mL (2 g Intravenous New Bag/Given 01/17/20 1959)  sodium chloride 0.9 % bolus 1,000 mL (has no administration in time range)  sodium chloride 0.9 % bolus 1,000 mL (0 mLs Intravenous Stopped 01/17/20 1952)  ondansetron (  ZOFRAN) injection 4 mg (4 mg Intravenous Given 01/17/20 1844)  potassium chloride SA (KLOR-CON) CR tablet 40 mEq (40 mEq Oral Given 01/17/20 1934)     ED Course  I have reviewed the triage vital signs and the nursing notes.  Pertinent labs & imaging results that were available during my care of the patient were reviewed by me and considered in my medical decision making (see chart for details).    MDM Rules/Calculators/A&P                          Labs: CBC: Mild leukocytosis of 12.1, however consistent with patient's baseline. CBG: Hyperglycemia 309. BMP: Mildly hyponatremic to 131, hypokalemic to 2.9, hypochloremic to 88, reduced bicarb to 20, anion gap elevated at 23. UA: Glucose greater than 500 with 80 ketones noted. Magnesium: --still in process Beta-hydroxybutyric acid: -- still in process  Patient is significantly dehydrated so we will start on fluids and continue to replete her hypokalemia to 2.9 prior to administering insulin.  We will also administer 2 g magnesium sulfate empirically as magnesium and beta hydroxybutyric acid still have not been collected.  Orders have been placed for fluid boluses, antiemetics, and potassium repletion for hours, but not yet administered.  She continues to have nausea and vomiting here in the room.  Patient is tachycardic to 117, likely due to dehydration.  Otherwise VS stable and WNL.  Her laboratory work-up is suggestive of diabetic ketoacidosis.     CBG obtained after initial laboratory work-up shows that her glucose is now under control.  Mother states that she had administered 12 units of regular insulin immediately prior to arrival which is likely what resolved her hyperglycemia.  However, she states that she has been persistently in the 300s and 400s despite her prescribed insulin.  However, still has not cleared gap.  Ketones in urine.  Uncomfortable, vomiting.  Will admit for DKA.  Discussed case with Dr. Alvino Chapel who agrees with assessment and plan.   Patient is followed by [facility], Dr. Ouida Sills DO.   Spoke with Dr. Ouida Sills who will come see and admit patient.    Final Clinical Impression(s) / ED Diagnoses Final diagnoses:  Diabetic ketoacidosis without coma associated with type 1 diabetes mellitus [facility])    Rx / DC Orders ED Discharge Orders    None       Corena Herter, PA-C 01/17/20 2004    Corena Herter, PA-C 01/17/20 2005    Corena Herter, PA-C 01/17/20 Jonelle Sports, MD 01/17/20 2009

## 2020-01-17 NOTE — H&P (Addendum)
[facility] Admission History and Physical Service Pager: [phone]  Patient name: Sabrina Sutton Medical record number: [id number] Date of birth: 1996-04-19 Age: 24 y.o. Gender: female  Primary Care Provider: Rory Percy, DO Consultants: none Code Status: full Preferred Emergency Contact: mother Sabrina Sutton) [phone]  Chief Complaint: NV  Assessment and Plan: Sabrina Sutton is a 24 y.o. female presenting with DKA . PMH is significant for T1DM, marijuana use  Mild DKA in T1DM- diagnosed 08/2015. Seen in telemedicine visit 01/05/2020 and doing well at that time. patient has frequent DKA with episodes of N/V thought to be associated with cannabis hyperemesis. She is currently not altered and complains of only mild nausea. At home, she takes 18units lantus every morning and 8 units novolog with every meal. Denies any missed doses. Uses dexcom to monitor blood glucose levels which have been moderately well controlled until today the readings have just been "high" prior to admission. ECG on admission showing sinus tachycardia.potassium was 2.9 on admission so received 94mq K+, 2g Mg++ prior to insulin being administered and her blood sugars decreased from low 300s to 104. Initial anion gap of 23. Repeat BMP pending. 2L NS given on admission. Received d50 for hypoglycemia to 44 in ED. Last A1c on 4/23 was 9.4.  - admit to med surg, attending Dr. McDiarmid - refer to endotool for management  - BMPs q4hrs to monitor potassium, anion gap, notify physician if <3.5 K+  - neuro checks q2hrs to assess for AMS, notify physician if changes  [facility] for [facility]  - replace potassium as needed  - insulin and D50 gtt to be managed per protocol with CBG goal 140-160 overnight, notify physician if below this level  - CBGs qhr while on insulin drip - transition to subq inuslin and PO in the morning if anion gap closed and feeling well - diabetic  coordinator consult - nutrition consult - strict I/O - zofran PRN - maintenance IV fluids  Chronic back pain- no pain with palpation or percussion - k pad - tylenol PRN  FEN/GI: NPO Prophylaxis: lovenox  Disposition: admit to med-surg until DKA resolves  History of Present Illness:  Sabrina Doleckiis a 24y.o. female presenting with Nausea with multiple episodes of non-bloody emesis since Thursday (3 days). Patient states that she has not been able to tolerate any food or drink since Thursday. She was able to tolerate the potassium and water given to her in the ED. She frequently has spells of intractable nausea and vomiting which seems to be associated with marijuana use. She had previously been smoking marijuana three times per day but once diagnosed with cannabis hyperemesis syndrome she has decreased to only one or two times per day. She feels that this decrease has improved her nausea and vomiting episodes. She has not used any marijuana since Thursday. Patient states that she has been adherent with all of her insulin doses and has not missed anything in the past several days. Her Dexcom was measuring her blood sugars as high today and she has been having intractable nausea and vomiting despite as needed Zofran at home. She feels much better after receiving fluids and antimedics in the ED. She is currently experiencing mild nausea and mild lower back pain which is chronic for her. She has no other complaints at this time. Denies headache, vision changes, abdominal pain, diarrhea. She last vomited upon arrival to the ED.  Review Of Systems: Per HPI with the following  additions:   Review of Systems  Constitutional: Positive for appetite change. Negative for diaphoresis, fever and unexpected weight change.  Respiratory: Negative for chest tightness and shortness of breath.   Cardiovascular: Negative for chest pain and palpitations.  Gastrointestinal: Positive for nausea and vomiting.  Negative for abdominal pain and diarrhea.  Genitourinary: Negative for difficulty urinating and dysuria.  Musculoskeletal: Positive for back pain.  Skin: Negative for rash.  Neurological: Negative for weakness, light-headedness and headaches.     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   Frequent No-show for appointment 11/03/2016   History of pyelonephritis 04/17/2016   Type 1 diabetes mellitus with complications ([place]) 16/04/9603    Past Medical History: Past Medical History:  Diagnosis Date   Diabetes mellitus without complication ([place]) 5/40/98   + GAD Ab   DKA (diabetic ketoacidoses) ([place]) 09/20/2015   Hypokalemia    Pyelonephritis 04/17/2016    Past Surgical History: Past Surgical History:  Procedure Laterality Date   NO PAST SURGERIES     Social History: Social History   Tobacco Use   Smoking status: Current Every Day Smoker    Packs/day: 0.25    Types: Cigarettes   Smokeless tobacco: Never Used  Vaping Use   Vaping Use: Never used  Substance Use Topics   Alcohol use: Yes    Comment: Last drank 1 month ago per MD note   Drug use: Yes    Types: Marijuana    Comment: Last used 1  month ago per MD note   Please also refer to relevant sections of EMR.  Family History: Family History  Problem Relation Age of Onset   Diabetes Maternal Grandmother    Heart disease Maternal Grandmother        Deceased from MI at  age 70   Hypertension Maternal Grandmother    Hypercholesterolemia Mother    Seizures Mother    Kidney Stones Mother    Hyperlipidemia Mother    Stroke Maternal Grandfather        Deceased from stroke at age 40   Hypertension Paternal Grandmother    Healthy Father    Allergies and Medications: No Known Allergies No current facility-administered medications on file prior to encounter.   Current Outpatient Medications on File Prior to Encounter  Medication Sig Dispense Refill   ACCU-CHEK AVIVA PLUS test strip USE AS DIRECTED 100 each 11   Accu-Chek FastClix Lancets MISC Check sugar 10 x daily 304 each 3   Alcohol Swabs (ALCOHOL PADS) 70 % PADS Use to wipe skin prior to insulin injections twice daily 200 each 6   blood glucose meter kit and supplies Dispense based on patient and insurance preference. Use up to four times daily as directed. (FOR ICD-10 E10.9, E11.9). 1 each 0   Blood Glucose Monitoring Suppl (ACCU-CHEK GUIDE ME) w/Device KIT 1 kit by Does not apply route as directed. 1 kit 1   Continuous Blood Gluc Receiver (DEXCOM G6 RECEIVER) DEVI 1 Device by Does  not apply route as directed. 1 each 2   Continuous Blood Gluc Sensor (DEXCOM G6 SENSOR) MISC Inject 1 applicator into the skin as directed. (change sensor every 10 days) 3 each 11   Continuous Blood Gluc Transmit (DEXCOM G6 TRANSMITTER) MISC Inject 1 Device into the skin as directed. (re-use up to 8x with each new sensor) 1 each 3   Glucagon (BAQSIMI TWO PACK) 3 MG/DOSE POWD Place 1 spray into the nose as directed. 1 each 3   glucose blood (ACCU-CHEK AVIVA PLUS) test strip PLEASE CHECK BLOOD SUGAR FOUR TIMES DAILY 600 strip 2   insulin glargine (LANTUS SOLOSTAR) 100 UNIT/ML Solostar Pen Inject 18 Units into the skin every morning.     Insulin Pen Needle (B-D UF III MINI PEN NEEDLES) 31G X 5 MM MISC USE TO CHECK BLOOD SUGAR IN THE MORNING BEFORE EATING, BEFORE EACH MEAL, AND AS NEEDED 1000 each 0   Insulin Pen  Needle (B-D UF III MINI PEN NEEDLES) 31G X 5 MM MISC CHECK BLOOD SUGAR IN THE MORNING BEFORE EATING, BEFORE EACH MEAL, AND AS NEEDED 1000 each 0   Lancets (ACCU-CHEK SOFT TOUCH) lancets Use as directed. 100 each 5   norgestimate-ethinyl estradiol (ORTHO-CYCLEN) 0.25-35 MG-MCG tablet Take 1 tablet by mouth daily. 1 Package 11   NOVOLOG FLEXPEN 100 UNIT/ML FlexPen Inject 3-8 Units into the skin 3 (three) times daily.     ondansetron (ZOFRAN-ODT) 8 MG disintegrating tablet Take 1 tablet (8 mg total) by mouth every 8 (eight) hours as needed for nausea or vomiting. 20 tablet 0   Objective: BP 127/88 (BP Location: Right Arm)    Pulse (!) 117    Temp 98.8 F (37.1 C) (Oral)    Resp 17    SpO2 100%  Exam: General: alert, well-appearing Eyes: PERRL ENTM: MMM Neck: neg thryomegaly Cardiovascular: RRR, no murmur appreciated Respiratory: CTAB, no increased WOB Gastrointestinal: neg abd tenderness to palpation. dexicom in place in periumbilical area MSK: normal development, moving all extremities. Negative CVA tenderness or pain with palpitaiton of lower back Derm: no rashes or lesions noted Neuro: alert, oriented. Able to follow commands Psych: appropriate mood and conversation  Labs and Imaging: CBC BMET  Recent Labs  Lab 01/17/20 1511  WBC 12.1*  HGB 15.6*  HCT 46.1*  PLT 492*   Recent Labs  Lab 01/17/20 1511  NA 131*  K 2.9*  CL 88*  CO2 20*  BUN 15  CREATININE 1.10*  GLUCOSE 300*  CALCIUM 10.3     EKG: sinus tachycardia  Richarda Osmond, DO 01/17/2020, 8:33 PM PGY-3, [place] Intern pager: [phone], text pages welcome

## 2020-01-17 NOTE — ED Notes (Signed)
Pt chose to leave AMA after speaking with MD Maudie Mercury. This RN emphasized that pt is able to come back at any time, that she would be seen & treated if need be. Pt & mother voiced understanding. Opportunity for questioning and answers were provided. Armband removed by staff, pt discharged from ED with family.

## 2020-01-17 NOTE — Progress Notes (Signed)
Interim Progress Note   Subjective: Received page from ER RN about patient wanting to leave AMA as she feels better after receiving medication and IV fluids. Went to bedside and spoke with patient and patient's mother who was also at bedside. Patient reports that she is feeling much better and would like to go home. Additionally, she notes that her glucose has been improved since being here and that she has not vomited since around 1700. Mom also reports that she gave the patient her short and long-acting earlier this afternoon as patient did not take it this morning (though she has been compliant with her medications in the past days) and that is probably why her blood sugars are continuing to be low.  Objective: BP 126/86 (BP Location: Right Arm)   Pulse 98   Temp 98.7 F (37.1 C) (Oral)   Resp 19   SpO2 99%   Mildly tired appearing, no acute distress. No altered mental status.  Assessment plan: Patient with type 1 diabetes admitted for DKA wanting to go home. Her last labs at 2200 showed sodium 129, potassium 3.2, glucose 273, anion gap 14 (23 at 1500). Her beta hydroxybutyrate was elevated at 4.2 from 3.45 at 1720.  I have encouraged patient to stay as her labs are not completely normal and that she would benefit from further fluids and monitoring. As patient is a type I diabetic, low to normal glucose values do not mean that she is back to her baseline. Explained to patient that going home at this time could be dangerous and could lead to death. She is understanding of consequences and opts to leave AMA. I have encouraged plenty of fluid intake and to check in with family medicine center on Monday to be seen and for repeat labs. Patient expresses understanding.   Spoke with RN Jinny Blossom and confirm that patient will be leaving AMA. Recommend that patient receive at least one of her doses of 40 mg K-Dur prior to leaving AMA due to her potassium at 3.2.  Wilber Oliphant, M.D.  11:43 PM  01/17/2020

## 2020-01-17 NOTE — ED Triage Notes (Signed)
Patient complains of 4 days of abdominal pain with vomiting and states BS been running high 300s. Took her insulin pta. Patient alert and oriented, denis diarrhea

## 2020-01-23 ENCOUNTER — Telehealth: Payer: Self-pay | Admitting: Pharmacist

## 2020-01-23 NOTE — Telephone Encounter (Signed)
Atttempted contact RE CGM control and potential insulin adjustment.   Overall readings on CGM have improved with many fewer low readings.  GMI (a1C) is 8.1  I requested call back - potential decrease in basal dose may be appropriate as patient continues to have low readings throughout the day with no pattern.

## 2020-01-27 ENCOUNTER — Telehealth: Payer: Self-pay | Admitting: Pharmacist

## 2020-01-27 NOTE — Telephone Encounter (Signed)
Attempted to contact RE blood sugar control with CGM readings that have improved with fewer low readings (~ 5 % total), however high readings remain concerning.    Requested call back OR for patient to schedule a visit this week to discuss.

## 2020-03-04 ENCOUNTER — Other Ambulatory Visit: Payer: Self-pay

## 2020-03-04 ENCOUNTER — Ambulatory Visit (INDEPENDENT_AMBULATORY_CARE_PROVIDER_SITE_OTHER): Payer: Medicaid Other | Admitting: Pharmacist

## 2020-03-04 DIAGNOSIS — E108 Type 1 diabetes mellitus with unspecified complications: Secondary | ICD-10-CM

## 2020-03-04 NOTE — Progress Notes (Signed)
Reviewed: I agree with Dr. Koval's documentation and management. 

## 2020-03-04 NOTE — Patient Instructions (Addendum)
It was nice to see you today!  Your goal blood sugar is 80-130 before eating and less than 180 after eating.  Medication Changes:  1. Continue Lantus 18 units daily  2. Continue Novolog 3-8 units three times daily with meals  3. Avoid taking extra doses of Novolog!  4. Carb counting is important! Please write down what you eat, the time, and how much Novolog you used. You can also use carb counting apps - such as "Carb Manager"  5. You can use Dexcom to document how much insulin you use throughout the day!  Keep up the good work with diet and exercise. Aim for a diet full of vegetables, fruit and lean meats (chicken, [place], fish). Try to limit salt intake by eating fresh or frozen vegetables (instead of canned), rinse canned vegetables prior to cooking and do not add any additional salt to meals.   Will see you in two weeks!

## 2020-03-04 NOTE — Progress Notes (Signed)
S:     Chief Complaint  Patient presents with  . Medication Management    Dexcom F/u   Patient presents today for diabetes management and Dexcom G6 review. Patient was referred and last seen by Primary Care Provider on 6/14. Most recent ED admission on 01/15/20 for DKA however left AMA.  PMH significant for T1DM, DKA (12/2019), depression  Today, patient reports that she has been compliant with Lantus 18 units daily and Novolog 3-8 units TID w/meals (using 8 units majority of the time), however states that she sometimes takes an additional dose of Novolog 6-8 units at bedtime if sugars remain elevated (200-300) a couple hours after her evening meal time Novolog. She reports having a "low" 2 days ago because she took 8 units of Novolog due to her sugar at 250. Denies nocturia, neuropathy, and visual changes. Patient has difficulty counting her carbs and difficulty determining how much meal time insulin is needed based on the number of carbs she eats. Yesterday for breakfast, she had a bowl cereal (took 8 units of Novolog); for lunch had baked fish with salad (took 6 units of Novolog); for dinner had a [employer] burger and small fry (took 6 units of Novolog). Reports drinking apple, orange, and cranberry juice; plenty of water, and limits soda and alcohol intake. Denies exercising. 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   Insurance coverage/medication affordability: Medicaid  Patient reports Diabetes was diagnosed in 2017.   Patient reports hypoglycemic events (per CGM report)  Patient reports adherence with medications.  Current diabetes medications include: Lantus 18 units daily and Novolog 3-8 units TID w/meals  Patient reported dietary habits:  Breakfast: bowl cereal (took 8 units of Novolog) Lunch: baked fish with salad (took 6 units of Novolog);  Dinner: Environmental consultant  fry (took 6 units of Novolog).  Drinks: apple, orange, and cranberry juice; plenty of water, and limits soda and alcohol intake.  Patient-reported exercise habits: Does not exercise   Patient taking >1 gram acetaminophen every 6 hours: denies   O:   CGM report from last 14 days  Average Glucose 286 mg/dL  Glucose Meter Index (GMI) N/A  Glucose Variability N/A   Time in Range (TIR) %  Very High 63%  High 15%  Target 21%  Low <1%  Very Low <1%    Labs:   Vitals:   03/04/20 1104  BP: 108/72  Pulse: 100  SpO2: 99%    Lab Results  Component Value Date   HGBA1C 9.4 (A) 11/14/2019   HGBA1C 11.0 (H) 06/18/2019   HGBA1C 10.6 (H) 02/20/2019    Lab Results  Component Value Date   CPEPTIDE 1.0 (L) 09/20/2015       Component Value Date/Time   CHOL 237 (H) 10/14/2018 0220   TRIG 45 10/14/2018 0220   HDL 70 10/14/2018 0220   CHOLHDL 3.4 10/14/2018 0220   VLDL 9 10/14/2018 0220   LDLCALC 158 (H) 10/14/2018 0220   A/P: Type 1 Diabetes longstanding since 2017 currently uncontrolled. Patient is adherent with DM medications, however has been taking an additional dose of Novolog (6-8 units) right before bedtime if sugars are above 250. This is likely causing some of her low sugars per CGM daily trends. Control is suboptimal due to lack of carb counting and lack of ability to fully assess 24/7 BG readings. Extensively discussed the importance of carb counting and provided  patient with carb counting phone apps such as "Carb manager" that provides the number of carbs for various foods and has the ability to document meals. Also encouraged patient to document via her dexcom or a journal the amount of units of Novolog she takes with each meal. Explained the importance of meal reporting and documenting meal time insulin units. Discussed patient should avoid taking additional doses of Novolog as this will increase her risk of hypoglycemia. Patient verbalized understanding.  1. Continue  medications: Lantus 18 units daily and Novolog 3-8 units TID w/meals 2. Continue to use Dexcom G6 CGM 3. Extensively discussed pathophysiology of diabetes, recommended lifestyle interventions, dietary effects on blood sugar control.  Counseling time 15 minutes.  4. Counseled on s/sx of and management of hypoglycemia 5. Next A1C anticipated 03/2020.  Follow-up appointment in 2 weeks to review sugar readings and replace CGM sensor. Written patient instructions provided This appointment required 50 minutes of patient care (this includes precharting, chart review, review of results, face-to-face care, etc.).  Thank you for involving pharmacy to assist in providing this patient's care.  Patient seen by Mickeal Skinner, PharmD Candidate, Rebbeca Paul, PharmD - PGY-1 Resident. and Lorel Monaco, PharmD, PGY2 Pharmacy Resident.

## 2020-03-04 NOTE — Assessment & Plan Note (Signed)
Type 1 Diabetes longstanding since 2017 currently uncontrolled. Patient is adherent with DM medications, however has been taking an additional dose of Novolog (6-8 units) right before bedtime if sugars are above 250. This is likely causing some of her low sugars per CGM daily trends. Control is suboptimal due to lack of carb counting and lack of ability to fully assess 24/7 BG readings. Extensively discussed the importance of carb counting and provided patient with carb counting phone apps such as "Carb manager" that provides the number of carbs for various foods and has the ability to document meals. Also encouraged patient to document via her dexcom or a journal the amount of units of Novolog she takes with each meal. Explained the importance of meal reporting and documenting meal time insulin units. Discussed patient should avoid taking additional doses of Novolog as this will increase her risk of hypoglycemia. Patient verbalized understanding.  1. Continue medications: Lantus 18 units daily and Novolog 3-8 units TID w/meals 2. Continue to use Dexcom G6 CGM 3. Extensively discussed pathophysiology of diabetes, recommended lifestyle interventions, dietary effects on blood sugar control.  Counseling time 15 minutes.  4. Counseled on s/sx of and management of hypoglycemia 5. Next A1C anticipated 03/2020.  Follow-up appointment in 2 weeks to review sugar readings and replace CGM sensor. Written patient instructions provided This appointment required 50 minutes of patient care (this includes precharting, chart review, review of results, face-to-face care, etc.).

## 2020-03-08 ENCOUNTER — Other Ambulatory Visit: Payer: Self-pay

## 2020-03-08 ENCOUNTER — Encounter (HOSPITAL_COMMUNITY): Payer: Self-pay | Admitting: Emergency Medicine

## 2020-03-08 ENCOUNTER — Ambulatory Visit (HOSPITAL_COMMUNITY)
Admission: EM | Admit: 2020-03-08 | Discharge: 2020-03-08 | Disposition: A | Discharge status: Home / Self Care | Payer: Medicaid Other | Attending: Emergency Medicine | Admitting: Emergency Medicine

## 2020-03-08 DIAGNOSIS — Z113 Encounter for screening for infections with a predominantly sexual mode of transmission: Secondary | ICD-10-CM

## 2020-03-08 NOTE — ED Triage Notes (Signed)
Pt present with no symptoms for STD testing.

## 2020-03-08 NOTE — Discharge Instructions (Signed)
We will notify of you any positive findings or if any changes to treatment are needed. If normal or otherwise without concern to your results, we will not call you. Please log on to your MyChart to review your results if interested in so.   Please use condoms to prevent STD's.

## 2020-03-08 NOTE — ED Provider Notes (Signed)
[facility]    CSN: [id number] Arrival date & time: 03/08/20  0805      History   Chief Complaint No chief complaint on file.   HPI Sonika Levins is a 24 y.o. female.   [name] presents with requests for STD screen. Denies any acute symptoms. She has 1 female partner. Denies any previous previous std's. LMP earlier this morning. No vaginal bleeding. Declines HIV/RPR   ROS per HPI, negative if not otherwise mentioned.      Past Medical History:  Diagnosis Date  . Diabetes mellitus without complication ([place]) 8/58/85   + GAD Ab  . DKA (diabetic ketoacidoses) ([place]) 09/20/2015  . Hypokalemia   . Pyelonephritis 04/17/2016    Patient Active Problem List   Diagnosis Date Noted  . DKA, type 1 ([place]) 01/17/2020  . Depression, recurrent ([place]) 12/19/2019  . Cannabis hyperemesis syndrome concurrent with and due to cannabis abuse ([place]) 12/19/2019  . Noncompliance with medication regimen 06/18/2019  . Diarrhea 04/11/2019  . Contraception management 03/04/2019  . RUQ pain   . DKA (diabetic ketoacidoses) ([place]) 02/20/2019  . Acute pyelonephritis 10/13/2018  . Hypokalemia 10/13/2018  . Renal mass 08/21/2018  . Near syncope 05/03/2018  . Condyloma acuminatum of vulva 10/31/2017  . 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 Frequent No-show for appointment 11/03/2016  . History of pyelonephritis 04/17/2016  . Type 1 diabetes mellitus with complications ([place]) 02/77/4128    Past Surgical History:  Procedure Laterality Date  . NO PAST SURGERIES      OB History   No obstetric history on file.      Home Medications    Prior to Admission medications   Medication Sig Start Date End Date Taking? Authorizing Provider  ACCU-CHEK AVIVA PLUS test strip USE AS DIRECTED 06/18/18   Steve Rattler, DO  Accu-Chek FastClix Lancets MISC Check sugar 10 x daily 02/22/19   Wilber Oliphant, MD  acetaminophen (TYLENOL) 650 MG CR tablet Take 650-1,300 mg by mouth every 8 (eight)  hours as needed for pain.    [provider]  Alcohol Swabs (ALCOHOL PADS) 70 % PADS Use to wipe skin prior to insulin injections twice daily 02/22/19   Wilber Oliphant, MD  Aspirin-Caffeine (BAYER BACK & BODY PAIN EX ST) 500-32.5 MG TABS Take 1 tablet by mouth every 8 (eight) hours as needed (for pain).    [provider]  blood glucose meter kit and supplies Dispense based on patient and insurance preference. Use up to four times daily as directed. (FOR ICD-10 E10.9, E11.9). 02/22/19   Wilber Oliphant, MD  Blood Glucose Monitoring Suppl (ACCU-CHEK GUIDE ME) w/Device KIT 1 kit by Does not apply route as directed. 11/20/19   Rory Percy, DO  Continuous Blood Gluc Receiver (DEXCOM G6 RECEIVER) DEVI 1 Device by Does not apply route as directed. 11/15/19   Rory Percy, DO  Continuous Blood Gluc Sensor (DEXCOM G6 SENSOR) MISC Inject 1 applicator into the skin as directed. (change sensor every 10 days) 11/15/19   Rory Percy, DO  Continuous Blood Gluc Transmit (DEXCOM G6 TRANSMITTER) MISC Inject 1 Device into the skin as directed. (re-use up to 8x with each new sensor) 11/15/19   Rory Percy, DO  Glucagon (BAQSIMI TWO PACK) 3 MG/DOSE POWD Place 1 spray into the nose as directed. Patient not taking: Reported on 03/04/2020 11/20/19   Rory Percy, DO  glucose 4 GM chewable tablet Chew 1 tablet by mouth as needed for low blood sugar. Patient  not taking: Reported on 03/04/2020    [provider]  glucose blood (ACCU-CHEK AVIVA PLUS) test strip PLEASE CHECK BLOOD SUGAR FOUR TIMES DAILY 11/03/19   Rory Percy, DO  insulin glargine (LANTUS SOLOSTAR) 100 UNIT/ML Solostar Pen Inject 18 Units into the skin every morning. 12/25/19   Leavy Cella, RPH-CPP  Insulin Pen Needle (B-D UF III MINI PEN NEEDLES) 31G X 5 MM MISC USE TO CHECK BLOOD SUGAR IN THE MORNING BEFORE EATING, BEFORE EACH MEAL, AND AS NEEDED 02/22/19   Wilber Oliphant, MD  Insulin Pen Needle (B-D UF III MINI PEN NEEDLES) 31G  X 5 MM MISC CHECK BLOOD SUGAR IN THE MORNING BEFORE EATING, BEFORE EACH MEAL, AND AS NEEDED 02/22/19   Wilber Oliphant, MD  Lancets (ACCU-CHEK SOFT TOUCH) lancets Use as directed. 10/24/17   Winfrey, Alcario Drought, MD  NOVOLOG FLEXPEN 100 UNIT/ML FlexPen Inject 3-8 Units into the skin 3 (three) times daily. Patient taking differently: Inject 3-8 Units into the skin 3 (three) times daily with meals.  12/25/19   Leavy Cella, RPH-CPP  ondansetron (ZOFRAN-ODT) 8 MG disintegrating tablet Take 1 tablet (8 mg total) by mouth every 8 (eight) hours as needed for nausea or vomiting. Patient not taking: Reported on 03/04/2020 01/17/20   Wilber Oliphant, MD    Family History Family History  Problem Relation Age of Onset  . Diabetes Maternal Grandmother   . Heart disease Maternal Grandmother        Deceased from MI at age 50  . Hypertension Maternal Grandmother   . Hypercholesterolemia Mother   . Seizures Mother   . Kidney Stones Mother   . Hyperlipidemia Mother   . Stroke Maternal Grandfather        Deceased from stroke at age 56  . Hypertension Paternal Grandmother   . Healthy Father     Social History Social History   Tobacco Use  . Smoking status: Current Every Day Smoker    Packs/day: 0.25    Types: Cigarettes  . Smokeless tobacco: Never Used  Vaping Use  . Vaping Use: Never used  Substance Use Topics  . Alcohol use: Yes    Comment: Last drank 1 month ago per MD note  . Drug use: Yes    Types: Marijuana    Comment: Last used 1  month ago per MD note     Allergies   Patient has no known allergies.   Review of Systems Review of Systems   Physical Exam Triage Vital Signs ED Triage Vitals  Enc Vitals Group     BP 03/08/20 0818 99/67     Pulse Rate 03/08/20 0818 (!) 121     Resp 03/08/20 0818 18     Temp 03/08/20 0818 99.4 F (37.4 C)     Temp Source 03/08/20 0818 Oral     SpO2 03/08/20 0818 98 %     Weight --      Height --      Head Circumference --      Peak Flow --       Pain Score 03/08/20 0817 0     Pain Loc --      Pain Edu? --      Excl. in [place]? --    No data found.  Updated Vital Signs BP 99/67 (BP Location: Left Arm)   Pulse (!) 121   Temp 99.4 F (37.4 C) (Oral)   Resp 18   LMP 02/22/2020   SpO2 98%  Visual Acuity Right Eye Distance:   Left Eye Distance:   Bilateral Distance:    Right Eye Near:   Left Eye Near:    Bilateral Near:     Physical Exam Constitutional:      General: She is not in acute distress.    Appearance: She is well-developed.  Cardiovascular:     Rate and Rhythm: Tachycardia present.  Pulmonary:     Effort: Pulmonary effort is normal.  Abdominal:     Palpations: Abdomen is not rigid.     Tenderness: There is no abdominal tenderness. There is no guarding or rebound.  Genitourinary:    Comments: Denies sores, lesions, vaginal bleeding; no pelvic pain; gu exam deferred at this time, vaginal self swab collected.   Skin:    General: Skin is warm and dry.  Neurological:     Mental Status: She is alert and oriented to person, place, and time.      UC Treatments / Results  Labs (all labs ordered are listed, but only abnormal results are displayed) Labs Reviewed  CERVICOVAGINAL ANCILLARY ONLY    EKG   Radiology No results found.  Procedures Procedures (including critical care time)  Medications Ordered in UC Medications - No data to display  Initial Impression / Assessment and Plan / UC Course  I have reviewed the triage vital signs and the nursing notes.  Pertinent labs & imaging results that were available during my care of the patient were reviewed by me and considered in my medical decision making (see chart for details).     Vaginal cytology collected and pending. Safe sex encouraged.  Final Clinical Impressions(s) / UC Diagnoses   Final diagnoses:  Screen for STD (sexually transmitted disease)     Discharge Instructions     We will notify of you any positive findings or if any  changes to treatment are needed. If normal or otherwise without concern to your results, we will not call you. Please log on to your MyChart to review your results if interested in so.   Please use condoms to prevent STD's.    ED Prescriptions    None     PDMP not reviewed this encounter.   Zigmund Gottron, NP 03/08/20 [phone]

## 2020-03-09 ENCOUNTER — Encounter: Payer: Self-pay | Admitting: Family Medicine

## 2020-03-09 ENCOUNTER — Ambulatory Visit (HOSPITAL_COMMUNITY)
Admission: EM | Admit: 2020-03-09 | Discharge: 2020-03-09 | Disposition: A | Discharge status: Home / Self Care | Payer: Medicaid Other

## 2020-03-09 ENCOUNTER — Telehealth (HOSPITAL_COMMUNITY): Payer: Self-pay | Admitting: Family Medicine

## 2020-03-09 LAB — CERVICOVAGINAL ANCILLARY ONLY
Bacterial Vaginitis (gardnerella): POSITIVE — AB
Candida Glabrata: NEGATIVE
Candida Vaginitis: NEGATIVE
Chlamydia: NEGATIVE
Comment: NEGATIVE
Comment: NEGATIVE
Comment: NEGATIVE
Comment: NEGATIVE
Comment: NEGATIVE
Comment: NORMAL
Neisseria Gonorrhea: NEGATIVE
Trichomonas: POSITIVE — AB

## 2020-03-09 MED ORDER — METRONIDAZOLE 500 MG PO TABS: 500.0000 mg | ORAL_TABLET | Freq: Two times a day (BID) | ORAL | 0 refills | Status: DC

## 2020-03-09 NOTE — Telephone Encounter (Signed)
Positive trich and BV on cerv swab Will treat

## 2020-03-18 ENCOUNTER — Ambulatory Visit: Payer: Medicaid Other | Admitting: Pharmacist

## 2020-03-23 ENCOUNTER — Ambulatory Visit: Payer: Medicaid Other | Admitting: Pharmacist

## 2020-03-26 ENCOUNTER — Telehealth: Payer: Self-pay | Admitting: *Deleted

## 2020-03-26 NOTE — Telephone Encounter (Signed)
LVM for pt to call office back to be sure that she went and picked up the medication that was sent in from her visit with the Urgent Care per Dr. Maudie Mercury. Orena Cavazos Zimmerman Rumple, CMA

## 2020-03-28 ENCOUNTER — Encounter: Payer: Self-pay | Admitting: Family Medicine

## 2020-04-01 ENCOUNTER — Telehealth: Payer: Self-pay | Admitting: Pharmacist

## 2020-04-01 DIAGNOSIS — E108 Type 1 diabetes mellitus with unspecified complications: Secondary | ICD-10-CM

## 2020-04-01 NOTE — Telephone Encounter (Signed)
Contacted patient regarding blood sugar control. Patient reports sugars are improving based on Dexcom CGM readings. Currently taking Lantus 18 units daily and Novolog 8 units AM and 3-4 units PM with meals. Reports she woke up around 3AM, 2-3 days ago with significant nausea and vomiting - resolved the next day. States sugar was in 110s. Denied shakiness and sweating. Reports this episode was not similar to previous hypoglycemic episodes. Reports she is trying to eat more healthier. Encouraged her to journal each meal she eats and how much novolog she uses and bring log to visit in 1 week. Patient verbalized understanding.  Scheduled visit in 1 week on September 16th at 10:30AM in pharmacy clinic.  Lorel Monaco, PharmD PGY2 Ambulatory Care Resident [place]

## 2020-04-08 ENCOUNTER — Ambulatory Visit: Payer: Medicaid Other | Admitting: Pharmacist

## 2020-04-09 ENCOUNTER — Other Ambulatory Visit: Payer: Self-pay | Admitting: Family Medicine

## 2020-04-15 ENCOUNTER — Encounter: Payer: Self-pay | Admitting: Family Medicine

## 2020-04-16 ENCOUNTER — Encounter: Payer: Self-pay | Admitting: Family Medicine

## 2020-04-16 MED ORDER — LANTUS SOLOSTAR 100 UNIT/ML ~~LOC~~ SOPN: 18.0000 [IU] | PEN_INJECTOR | SUBCUTANEOUS | Status: DC

## 2020-04-17 MED ORDER — BD PEN NEEDLE MINI U/F 31G X 5 MM MISC: 0 refills | Status: DC

## 2020-04-17 NOTE — Addendum Note (Signed)
Addended by: Zettie Cooley E on: 04/17/2020 07:09 PM   Modules accepted: Orders

## 2020-04-22 ENCOUNTER — Telehealth: Payer: Self-pay | Admitting: Pharmacist

## 2020-04-22 DIAGNOSIS — E108 Type 1 diabetes mellitus with unspecified complications: Secondary | ICD-10-CM

## 2020-04-22 NOTE — Telephone Encounter (Signed)
Contacted patient regarding blood sugar control. Patient reports sugars on average range from 80-300s. Reports taking Lantus 18 units daily in the mornings and Novolog 3-8 units TID w/meals. Reports taking Novolog 8 units in the mornings and in the afternoons if sugars >250 takes 3-4 units and if > 300 takes 5-6 units. Reports one hypoglycemic event last week Friday (50s). States she is trying to do better in carb counting and documenting her meals. Encouraged patient to document meals for 3 days and bring log to next visit in 1 week. Patient verbalized understanding   Scheduled visit in 1 week on October 7th at 3:30PM in pharmacy clinic.  Lorel Monaco, PharmD PGY2 Ambulatory Care Resident [place]

## 2020-04-29 ENCOUNTER — Ambulatory Visit (INDEPENDENT_AMBULATORY_CARE_PROVIDER_SITE_OTHER): Payer: Medicaid Other | Admitting: Pharmacist

## 2020-04-29 ENCOUNTER — Other Ambulatory Visit: Payer: Self-pay

## 2020-04-29 ENCOUNTER — Encounter: Payer: Self-pay | Admitting: Pharmacist

## 2020-04-29 VITALS — BP 108/90 | HR 80 | Ht 67.0 in | Wt 121.8 lb

## 2020-04-29 DIAGNOSIS — F339 Major depressive disorder, recurrent, unspecified: Secondary | ICD-10-CM | POA: Diagnosis not present

## 2020-04-29 DIAGNOSIS — E108 Type 1 diabetes mellitus with unspecified complications: Secondary | ICD-10-CM | POA: Diagnosis not present

## 2020-04-29 MED ORDER — NOVOLOG FLEXPEN 100 UNIT/ML ~~LOC~~ SOPN: 8.0000 [IU] | PEN_INJECTOR | Freq: Three times a day (TID) | SUBCUTANEOUS | Status: DC

## 2020-04-29 MED ORDER — LANTUS SOLOSTAR 100 UNIT/ML ~~LOC~~ SOPN: 15.0000 [IU] | PEN_INJECTOR | SUBCUTANEOUS | Status: DC

## 2020-04-29 NOTE — Progress Notes (Signed)
S:     PMH significant for T1DM, DKA (12/2019), depression  Patient presents today for diabetes management and Dexcom G6 application. Patient was referred and last seen by Primary Care Provider 01/05/20.  Today, patient reports medication adherence with Lantus 18 units daily (AM) and Novolog 3-8 units TID w/meals. Normally takes Novolog 8 units in the morning and 6-7 units with lunch and dinner if sugars are 200s-300s. Reports increased mental breakdowns secondary to her depression which has caused lack of appetite. 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   Insurance coverage/medication affordability: Medicaid  Patient reports Diabetes was diagnosed in2017.   Patient reports hypoglycemic events (per CGM report)  Current diabetes medications include: Lantus 18 units daily (AM), Novolog 3-8 units TID w/meals  Patient reported dietary habits: Eats 2 meals/day -Soup, chicken wrap, salads, chili, limits fried food and eats fast food occasionally -drinks water, apple juice, cranberry juice, Gatorade, and poweraide. Does not drink sodas  Patient-reported exercise habits: stretching   Patient denies nocturia (nighttime urination).  Patient denies neuropathy (nerve pain). Patient denies visual changes.  Patient taking >1 gram acetaminophen every 6 hours: denies Patient taking hydroxyrea: denies  O:   CGM report from last 14 days  Average Glucose 287  Glucose Meter Index (GMI) 10.2  Glucose Variability N/A   Time in Range (TIR) %  Very High 63%  High 21%  Target 15%  Low <1%  Very Low <1%    Labs:   Vitals:   04/29/20 1612  BP: 108/90  Pulse: 80  SpO2: 99%    Lab Results  Component Value Date   HGBA1C 9.4 (A) 11/14/2019   HGBA1C 11.0 (H) 06/18/2019   HGBA1C 10.6 (H) 02/20/2019    Lab Results  Component Value Date   CPEPTIDE 1.0 (L) 09/20/2015       Component Value Date/Time   CHOL 237 (H) 10/14/2018 0220   TRIG 45 10/14/2018 0220   HDL 70  10/14/2018 0220   CHOLHDL 3.4 10/14/2018 0220   VLDL 9 10/14/2018 0220   LDLCALC 158 (H) 10/14/2018 0220    No results found for: MICRALBCREAT  PHQ-9 Score: 15 Reports "sometimes" suicidal ideation, however denies plans of harming herself. She verbally agreed to avoid harming herself AND keeping a scheduled appointment in the morning.    Assessment: Type 1 Diabetes longstanding since 2017 currently uncontrolled. Patient is adherent with DM medications. Control is suboptimal due to diet indiscretions and risk of hypoglycemia. CGM readings continue to remain elevated with no true pattern and she also experiences symptomatic hypoglycemic episodes (most recently last night). Discussed the importance of monitoring her diet and avoiding sugary drinks such as Gatorade, poweraide, and juice, and encouraged patient to exercise at least 30 mins 5 times weekly.  - Decreased Lantus from 18 units to 15 units daily - Increased Humalog from 3-7 units to 8 units three times daily with meals  - Extensively discussed pathophysiology of diabetes, recommended lifestyle interventions, dietary effects on blood sugar control.  - Counseled on s/sx of and management of hypoglycemia - Will call patient in 2 weeks to review sugar readings - Next A1C anticipated 04/2020.   History of depressed mood.  No history of treatment with medication.  Currently reports more "mental breakdowns" and decreased appetite (weight stable) due to depression. PHQ-9 = 19. She reports thoughts of harming herself, however affirmed that she does not have any plans to harm herself. Discussed with Dr. Erin Hearing and scheduled an appointment for tomorrow  with a provider for depression management. Patient verbally reassured [place] she will not harm herself from now until her appointment tomorrow.  - Follow-up appointment for depression management tomorrow (04/30/20) at 9:45AM  Written patient instructions provided.  This appointment required 40 minutes  of patient care (this includes precharting, chart review, review of results, face-to-face care, etc.). Thank you for involving pharmacy to assist in providing this patient's care.  Patient seen by Mercy Riding, PharmD - PGY-1 Resident and Lorel Monaco, PharmD, PGY2 Pharmacy Resident.

## 2020-04-29 NOTE — Assessment & Plan Note (Addendum)
History of depressed mood.  No history of treatment with medication. Currently reports more "mental breakdowns" and decreased appetite (weight stable) due to depression. PHQ-9 = 19.  She reports thoughts of harming herself, however affirmed that she does not have any plans to harm herself. Discussed with Dr. Erin Hearing and scheduled an appointment for tomorrow with a provider for depression management. Patient verbally reassured [place] she will not harm herself from now until her appointment tomorrow.  - Follow-up appointment for depression management tomorrow (04/30/20) at 9:45AM  Written patient instructions provided.

## 2020-04-29 NOTE — Patient Instructions (Addendum)
It was nice to see you today!  Your goal blood sugar is 80-130 before eating and less than 180 after eating.  Medication Changes: Begin taking Lantus 15 units daily in the mornings  Begin taking Novolog 8 units three times daily with meals  Avoid sugary drinks as much as possible!  Will call you in two weeks!  Keep up the good work with diet and exercise. Aim for a diet full of vegetables, fruit and lean meats (chicken, [place], fish). Try to limit salt intake by eating fresh or frozen vegetables (instead of canned), rinse canned vegetables prior to cooking and do not add any additional salt to meals.

## 2020-04-29 NOTE — Assessment & Plan Note (Signed)
Type 1 Diabetes longstanding since 2017 currently uncontrolled. Patient is adherent with DM medications. Control is suboptimal due to diet indiscretions and risk of hypoglycemia. CGM readings continue to remain elevated with no true pattern and she also experiences symptomatic hypoglycemic episodes (most recently last night). Discussed the importance of monitoring her diet and avoiding sugary drinks such as Gatorade, poweraide, and juice, and encouraged patient to exercise at least 30 mins 5 times weekly.  - Decreased Lantus from 18 units to 15 units daily - Increased Humalog from 3-7 units to 8 units three times daily with meals  - Extensively discussed pathophysiology of diabetes, recommended lifestyle interventions, dietary effects on blood sugar control.  - Counseled on s/sx of and management of hypoglycemia - Will call patient in 2 weeks to review sugar readings - Next A1C anticipated 04/2020.

## 2020-04-30 ENCOUNTER — Encounter: Payer: Self-pay | Admitting: Family Medicine

## 2020-04-30 ENCOUNTER — Ambulatory Visit: Payer: Medicaid Other | Admitting: Family Medicine

## 2020-04-30 ENCOUNTER — Other Ambulatory Visit: Payer: Self-pay | Admitting: Family Medicine

## 2020-04-30 NOTE — Telephone Encounter (Signed)
This Rx refill request was sent to [facility] Rx refills- pharmacy requesting RF.

## 2020-04-30 NOTE — Progress Notes (Signed)
Reviewed: I agree with Dr. Koval's documentation and management. 

## 2020-05-13 ENCOUNTER — Telehealth: Payer: Self-pay | Admitting: Pharmacist

## 2020-05-14 NOTE — Telephone Encounter (Signed)
Contacted patient regarding diabetes management. Patient did not answer and unable to leave voicemail.   Will call patient again in 1 week for diabetes management and to reschedule appointment with a provider for depression management.  Lorel Monaco, PharmD PGY2 Ambulatory Care Resident [place]

## 2020-05-20 ENCOUNTER — Telehealth: Payer: Self-pay | Admitting: Pharmacist

## 2020-05-20 DIAGNOSIS — E108 Type 1 diabetes mellitus with unspecified complications: Secondary | ICD-10-CM

## 2020-05-20 NOTE — Telephone Encounter (Signed)
Contacted patient regarding diabetes management. Patient did not answer and unable to leave voicemail.   Will call patient again in 2 weeks for diabetes management and to reschedule appointment with a provider for depression management.

## 2020-06-03 ENCOUNTER — Encounter: Payer: Self-pay | Admitting: Family Medicine

## 2020-06-03 ENCOUNTER — Other Ambulatory Visit: Payer: Self-pay | Admitting: Family Medicine

## 2020-06-04 ENCOUNTER — Other Ambulatory Visit: Payer: Self-pay | Admitting: Family Medicine

## 2020-06-04 ENCOUNTER — Encounter: Payer: Self-pay | Admitting: Family Medicine

## 2020-06-04 MED ORDER — ONDANSETRON 8 MG PO TBDP: 8.0000 mg | ORAL_TABLET | Freq: Three times a day (TID) | ORAL | 0 refills | Status: DC | PRN

## 2020-06-04 NOTE — Telephone Encounter (Signed)
Attempted to call patient at number listed. No answer and no option for voicemail.  Sent zofran to the pharmacy.

## 2020-06-04 NOTE — Telephone Encounter (Signed)
Spoke with patients mother who reports nausea and some vomiting. Mother states she feels this always happens before she starts her period. Mother stated her cbgs have been normal, last check 125. Mother denies sick contacts. The patient has not eaten today, advised to eat a small meal and to push fluids. Will forward to PCP for advisement on refill.

## 2020-06-07 ENCOUNTER — Other Ambulatory Visit: Payer: Self-pay | Admitting: Family Medicine

## 2020-06-07 DIAGNOSIS — E108 Type 1 diabetes mellitus with unspecified complications: Secondary | ICD-10-CM

## 2020-07-01 ENCOUNTER — Telehealth: Payer: Self-pay

## 2020-07-01 NOTE — Telephone Encounter (Signed)
Patient's mother calls nurse line regarding patient being unable to pick up Dexcom sensor. Mother states that pharmacy told her that PA was required.   Called and spoke with pharmacist, they are sending over paperwork to begin PA process.   Talbot Grumbling, RN

## 2020-07-02 NOTE — Telephone Encounter (Signed)
CSRA form faxed to medicaid with office notes. Mother has been updated.

## 2020-07-08 ENCOUNTER — Other Ambulatory Visit: Payer: Self-pay | Admitting: Pharmacist

## 2020-07-08 ENCOUNTER — Telehealth: Payer: Self-pay | Admitting: Pharmacist

## 2020-07-08 NOTE — Telephone Encounter (Signed)
Patient called regarding connectivity issues with Dexcom sensor. Patient has additionally sensors left, so instructed her to use a new sensor and will provide a sensor replacement at next follow-up visit in January.  Additionally, discussed diabetes management. Dexcom reports - average BG 281, 20% in target range, and 59% in very high range with BG as high as 400. Also reports <1% in low range with last documented low on 06/26/20. Patient reports no recent episodes of vomiting and denies symptomatic hypoglycemia. Reports taking Novolog 8 units 3-4 times daily with meals and Lantus 15 units daily.   Reports improving diet - eats small meals 2-3 times/day; eating more salads and bananas; reducing carb and sugar drink intake -Breakfast 9-10AM: bowl of cereal or a banana -Lunch 2-3PM and dinner 8-9PM   Plan: -Continue Novolog 8 units TID w/meals and increase by 2 units with meals high in carbs or sugar -Continue Lantus 15 units daily  Follow-up appointment scheduled for Thursday, 07/29/20

## 2020-07-14 ENCOUNTER — Telehealth: Payer: Self-pay

## 2020-07-14 NOTE — Telephone Encounter (Signed)
Patient calls nurse line reporting NV last night. Patient has concerns as she is type 1 diabetic. Patient does reports she did not really eat yesterday and "feels" that may be why. Patient reports she feels much better today and her sugars have been "normal." Patient did vomit today after eating soup, but that was it. Patient would like to be seen before the holiday. ED precautions given. Patient scheduled for tomorrow morning.

## 2020-07-14 NOTE — Progress Notes (Deleted)
    SUBJECTIVE:   CHIEF COMPLAINT / HPI:   Nausea and vomiting: Patient has type 1 diabetes  PERTINENT  PMH / PSH: ***  OBJECTIVE:   There were no vitals taken for this visit.  ***  ASSESSMENT/PLAN:   No problem-specific Assessment & Plan notes found for this encounter.     Benay Pike, MD [place]

## 2020-07-15 ENCOUNTER — Ambulatory Visit: Payer: Medicaid Other | Admitting: Family Medicine

## 2020-07-22 ENCOUNTER — Encounter: Payer: Self-pay | Admitting: Family Medicine

## 2020-07-22 ENCOUNTER — Ambulatory Visit (INDEPENDENT_AMBULATORY_CARE_PROVIDER_SITE_OTHER): Payer: Medicaid Other | Admitting: Family Medicine

## 2020-07-22 DIAGNOSIS — Z5329 Procedure and treatment not carried out because of patient's decision for other reasons: Secondary | ICD-10-CM

## 2020-07-22 NOTE — Progress Notes (Signed)
Patient was no show for appointment. This is the patient's second no show. Patient also had a documented no show on 07/15/20. Called patient at both numbers provided in chart with no answer on either.  A certified letter explaining no show policy has also been sent to the patient via mail.   Melene Plan, M.D.  2:47 PM 07/22/2020

## 2020-07-29 ENCOUNTER — Ambulatory Visit: Payer: Medicaid Other | Admitting: Pharmacist

## 2020-08-11 ENCOUNTER — Other Ambulatory Visit: Payer: Self-pay | Admitting: Family Medicine

## 2020-08-11 DIAGNOSIS — E108 Type 1 diabetes mellitus with unspecified complications: Secondary | ICD-10-CM

## 2020-08-12 ENCOUNTER — Telehealth: Payer: Self-pay | Admitting: Pharmacist

## 2020-08-12 ENCOUNTER — Other Ambulatory Visit: Payer: Self-pay | Admitting: Family Medicine

## 2020-08-12 DIAGNOSIS — E108 Type 1 diabetes mellitus with unspecified complications: Secondary | ICD-10-CM

## 2020-08-12 NOTE — Telephone Encounter (Signed)
  Notes to clinic Not a provider we approve rx for. Please assess.

## 2020-08-12 NOTE — Telephone Encounter (Signed)
Unable to leave voicemail requesting call back regarding diabetes management and need for follow-up visit for future refills.  Will call again in 1 week.

## 2020-08-12 NOTE — Telephone Encounter (Signed)
Will send refill for patient. She has missed and no-showed to multiple provider appointments. Last appt in 04/2020, novolog titrated to 8 units TID. She will need follow up for future refills.   Admin pool: Please call patient to make her aware.   CC'ed Dr. Valentina Lucks.

## 2020-08-19 ENCOUNTER — Telehealth: Payer: Self-pay | Admitting: Pharmacist

## 2020-08-19 DIAGNOSIS — E108 Type 1 diabetes mellitus with unspecified complications: Secondary | ICD-10-CM

## 2020-08-19 MED ORDER — NOVOLOG FLEXPEN 100 UNIT/ML ~~LOC~~ SOPN: 10.0000 [IU] | PEN_INJECTOR | Freq: Three times a day (TID) | SUBCUTANEOUS | 0 refills | Status: DC

## 2020-08-19 NOTE — Telephone Encounter (Signed)
Contacted patient regarding diabetes management. Patient reports sugars are better - majority 200-300s with a few in the 400s. Denies symptomatic hypoglycemia. Reports taking Lantus 15 units daily and Novolog 8 units TID w/meals. Reports still working on diet.   Plan: 1. Increased Novolog to 10 units TID w/meals. May reduce back to 8 units TID if experiencing low sugars. 2. Continue Lantus 15 units daily  Scheduled in clinic follow-up appointment with pharmacy on 09/02/20 at 10:30AM

## 2020-08-28 ENCOUNTER — Other Ambulatory Visit: Payer: Self-pay

## 2020-08-28 ENCOUNTER — Ambulatory Visit (HOSPITAL_COMMUNITY)
Admission: EM | Admit: 2020-08-28 | Discharge: 2020-08-28 | Disposition: A | Discharge status: Home / Self Care | Payer: Medicaid Other | Attending: Family Medicine | Admitting: Family Medicine

## 2020-08-28 ENCOUNTER — Encounter (HOSPITAL_COMMUNITY): Payer: Self-pay | Admitting: Emergency Medicine

## 2020-08-28 DIAGNOSIS — Z113 Encounter for screening for infections with a predominantly sexual mode of transmission: Secondary | ICD-10-CM | POA: Diagnosis not present

## 2020-08-28 NOTE — ED Triage Notes (Signed)
Patient presents for STI testing.   Patient has no complaints. Patient denies any abnormal vaginal discharge, ABD pain, or dysuria.

## 2020-08-28 NOTE — ED Provider Notes (Signed)
[facility]    CSN: [id number] Arrival date & time: 08/28/20  1658      History   Chief Complaint Chief Complaint  Patient presents with  . STD Testing     HPI Sabrina Sutton is a 24 y.o. female.   Here today requesting STI screening. No known exposures, no vaginal discharge, irritation, dysuria, hematuria, abdominal pain, fever.      Past Medical History:  Diagnosis Date  . Diabetes mellitus without complication ([place]) 4/43/15   + GAD Ab  . DKA (diabetic ketoacidoses) 09/20/2015  . Hypokalemia   . Pyelonephritis 04/17/2016    Patient Active Problem List   Diagnosis Date Noted  . DKA, type 1 ([place]) 01/17/2020  . Depression, recurrent ([place]) 12/19/2019  . Cannabis hyperemesis syndrome concurrent with and due to cannabis abuse ([place]) 12/19/2019  . Noncompliance with medication regimen 06/18/2019  . Diarrhea 04/11/2019  . Contraception management 03/04/2019  . RUQ pain   . DKA (diabetic ketoacidoses) 02/20/2019  . Acute pyelonephritis 10/13/2018  . Hypokalemia 10/13/2018  . Renal mass 08/21/2018  . Near syncope 05/03/2018  . Condyloma acuminatum of vulva 10/31/2017  . 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 Frequent No-show for appointment 11/03/2016  . History of pyelonephritis 04/17/2016  . Type 1 diabetes mellitus with complications ([place]) 40/02/6760    Past Surgical History:  Procedure Laterality Date  . NO PAST SURGERIES      OB History   No obstetric history on file.      Home Medications    Prior to Admission medications   Medication Sig Start Date End Date Taking? Authorizing Provider  ACCU-CHEK AVIVA PLUS test strip USE AS DIRECTED 06/18/18   Steve Rattler, DO  Accu-Chek FastClix Lancets MISC Check sugar 10 x daily 02/22/19   Wilber Oliphant, MD  acetaminophen (TYLENOL) 650 MG CR tablet Take 650-1,300 mg by mouth every 8 (eight) hours as needed for pain. Patient not taking: No sig reported    [provider]  Alcohol Swabs (ALCOHOL  PADS) 70 % PADS Use to wipe skin prior to insulin injections twice daily 02/22/19   Wilber Oliphant, MD  Aspirin-Caffeine (BAYER BACK & BODY PAIN EX ST) 500-32.5 MG TABS Take 1 tablet by mouth every 8 (eight) hours as needed (for pain).    [provider]  B-D UF III MINI PEN NEEDLES 31G X 5 MM MISC CHECK BLOOD SUGAR IN THE MORNING BEFORE EATING, BEFORE EACH MEAL, AND AS NEEDED 04/13/20   Wilber Oliphant, MD  blood glucose meter kit and supplies Dispense based on patient and insurance preference. Use up to four times daily as directed. (FOR ICD-10 E10.9, E11.9). 02/22/19   Wilber Oliphant, MD  Blood Glucose Monitoring Suppl (ACCU-CHEK GUIDE ME) w/Device KIT 1 kit by Does not apply route as directed. 11/20/19   Myles Gip, DO  Continuous Blood Gluc Receiver (DEXCOM G6 RECEIVER) DEVI 1 Device by Does not apply route as directed. 11/15/19   Myles Gip, DO  Continuous Blood Gluc Sensor (DEXCOM G6 SENSOR) MISC Inject 1 applicator into the skin as directed. (change sensor every 10 days) 11/15/19   Rumball, Jake Church, DO  Continuous Blood Gluc Transmit (DEXCOM G6 TRANSMITTER) MISC Inject 1 Device into the skin as directed. (re-use up to 8x with each new sensor) 11/15/19   Rumball, Jake Church, DO  Glucagon (BAQSIMI TWO PACK) 3 MG/DOSE POWD Place 1 spray into the nose as directed. Patient not taking: No sig reported  11/20/19   Myles Gip, DO  glucose 4 GM chewable tablet Chew 1 tablet by mouth as needed for low blood sugar. Patient not taking: No sig reported    [provider]  glucose blood (ACCU-CHEK AVIVA PLUS) test strip PLEASE CHECK BLOOD SUGAR FOUR TIMES DAILY 11/03/19   Myles Gip, DO  Insulin Pen Needle (B-D UF III MINI PEN NEEDLES) 31G X 5 MM MISC USE TO CHECK BLOOD SUGAR IN THE MORNING BEFORE EATING, BEFORE EACH MEAL, AND AS NEEDED 04/17/20   Wilber Oliphant, MD  Lancets (ACCU-CHEK SOFT TOUCH) lancets Use as directed. 10/24/17   Kathrene Alu, MD  LANTUS SOLOSTAR 100 UNIT/ML  Solostar Pen ADMINISTER 15 UNITS UNDER THE SKIN DAILY 08/13/20   Wilber Oliphant, MD  NOVOLOG FLEXPEN 100 UNIT/ML FlexPen Inject 10 Units into the skin 3 (three) times daily with meals. 08/19/20   Zenia Resides, MD  ondansetron (ZOFRAN-ODT) 8 MG disintegrating tablet Take 1 tablet (8 mg total) by mouth every 8 (eight) hours as needed for nausea or vomiting. 06/04/20   Wilber Oliphant, MD    Family History Family History  Problem Relation Age of Onset  . Diabetes Maternal Grandmother   . Heart disease Maternal Grandmother        Deceased from MI at age 50  . Hypertension Maternal Grandmother   . Hypercholesterolemia Mother   . Seizures Mother   . Kidney Stones Mother   . Hyperlipidemia Mother   . Stroke Maternal Grandfather        Deceased from stroke at age 32  . Hypertension Paternal Grandmother   . Healthy Father     Social History Social History   Tobacco Use  . Smoking status: Current Every Day Smoker    Packs/day: 0.25    Types: Cigarettes  . Smokeless tobacco: Never Used  Vaping Use  . Vaping Use: Never used  Substance Use Topics  . Alcohol use: Yes    Comment: Last drank 1 month ago per MD note  . Drug use: Yes    Types: Marijuana    Comment: Last used 1  month ago per MD note     Allergies   Patient has no known allergies.   Review of Systems Review of Systems PER HPI    Physical Exam Triage Vital Signs ED Triage Vitals  Enc Vitals Group     BP 08/28/20 1706 109/72     Pulse Rate 08/28/20 1706 79     Resp 08/28/20 1706 16     Temp 08/28/20 1706 97.9 F (36.6 C)     Temp Source 08/28/20 1706 Oral     SpO2 08/28/20 1706 99 %     Weight 08/28/20 1704 117 lb (53.1 kg)     Height 08/28/20 1704 $RemoveBefor'5\' 7"'ObgUsKPPNxcd$  (1.702 m)     Head Circumference --      Peak Flow --      Pain Score 08/28/20 1704 0     Pain Loc --      Pain Edu? --      Excl. in [place]? --    No data found.  Updated Vital Signs BP 109/72 (BP Location: Left Arm)   Pulse 79   Temp 97.9 F (36.6  C) (Oral)   Resp 16   Ht $R'5\' 7"'OC$  (1.702 m)   Wt 117 lb (53.1 kg)   LMP 08/18/2020   SpO2 99%   BMI 18.32 kg/m   Visual Acuity Right  Eye Distance:   Left Eye Distance:   Bilateral Distance:    Right Eye Near:   Left Eye Near:    Bilateral Near:     Physical Exam Vitals and nursing note reviewed.  Constitutional:      Appearance: Normal appearance. She is not ill-appearing.  HENT:     Head: Atraumatic.  Eyes:     Extraocular Movements: Extraocular movements intact.     Conjunctiva/sclera: Conjunctivae normal.  Cardiovascular:     Rate and Rhythm: Normal rate and regular rhythm.     Heart sounds: Normal heart sounds.  Pulmonary:     Effort: Pulmonary effort is normal.     Breath sounds: Normal breath sounds.  Genitourinary:    Comments: GU exam deferred, self swab performed Musculoskeletal:        General: Normal range of motion.     Cervical back: Normal range of motion and neck supple.  Skin:    General: Skin is warm and dry.  Neurological:     Mental Status: She is alert and oriented to person, place, and time.  Psychiatric:        Mood and Affect: Mood normal.        Thought Content: Thought content normal.        Judgment: Judgment normal.      UC Treatments / Results  Labs (all labs ordered are listed, but only abnormal results are displayed) Labs Reviewed  CERVICOVAGINAL ANCILLARY ONLY    EKG   Radiology No results found.  Procedures Procedures (including critical care time)  Medications Ordered in UC Medications - No data to display  Initial Impression / Assessment and Plan / UC Course  I have reviewed the triage vital signs and the nursing notes.  Pertinent labs & imaging results that were available during my care of the patient were reviewed by me and considered in my medical decision making (see chart for details).     Aptima swab pending, exam and vitals benign, discussed safe sexual practices. Treat based on swab results.   Final  Clinical Impressions(s) / UC Diagnoses   Final diagnoses:  Routine screening for STI (sexually transmitted infection)   Discharge Instructions   None    ED Prescriptions    None     PDMP not reviewed this encounter.   Volney American, [place] 08/28/20 1722

## 2020-08-29 ENCOUNTER — Encounter: Payer: Self-pay | Admitting: Family Medicine

## 2020-08-29 LAB — CERVICOVAGINAL ANCILLARY ONLY
Bacterial Vaginitis (gardnerella): POSITIVE — AB
Candida Glabrata: NEGATIVE
Candida Vaginitis: NEGATIVE
Chlamydia: NEGATIVE
Comment: NEGATIVE
Comment: NEGATIVE
Comment: NEGATIVE
Comment: NEGATIVE
Comment: NEGATIVE
Comment: NORMAL
Neisseria Gonorrhea: POSITIVE — AB
Trichomonas: POSITIVE — AB

## 2020-08-30 ENCOUNTER — Telehealth (HOSPITAL_COMMUNITY): Payer: Self-pay | Admitting: Emergency Medicine

## 2020-08-30 ENCOUNTER — Ambulatory Visit (HOSPITAL_COMMUNITY)
Admission: EM | Admit: 2020-08-30 | Discharge: 2020-08-30 | Disposition: A | Discharge status: Home / Self Care | Payer: Medicaid Other | Attending: Internal Medicine | Admitting: Internal Medicine

## 2020-08-30 DIAGNOSIS — A549 Gonococcal infection, unspecified: Secondary | ICD-10-CM | POA: Diagnosis not present

## 2020-08-30 MED ORDER — CEFTRIAXONE SODIUM 500 MG IJ SOLR
INTRAMUSCULAR | Status: AC
  Filled 2020-08-30: qty 500

## 2020-08-30 MED ORDER — CEFTRIAXONE SODIUM 500 MG IJ SOLR
500.0000 mg | Freq: Once | INTRAMUSCULAR | Status: AC
  Administered 2020-08-30: 500 mg via INTRAMUSCULAR

## 2020-08-30 MED ORDER — METRONIDAZOLE 500 MG PO TABS: 500.0000 mg | ORAL_TABLET | Freq: Two times a day (BID) | ORAL | 0 refills | Status: DC

## 2020-08-30 NOTE — ED Triage Notes (Signed)
PAtient presents after positive Gonorrhea for IM ROcephin 500mg 

## 2020-09-02 ENCOUNTER — Other Ambulatory Visit: Payer: Self-pay | Admitting: Family Medicine

## 2020-09-02 ENCOUNTER — Ambulatory Visit: Payer: Medicaid Other | Admitting: Pharmacist

## 2020-09-02 DIAGNOSIS — E108 Type 1 diabetes mellitus with unspecified complications: Secondary | ICD-10-CM

## 2020-09-05 ENCOUNTER — Other Ambulatory Visit: Payer: Self-pay | Admitting: Family Medicine

## 2020-09-06 ENCOUNTER — Other Ambulatory Visit: Payer: Self-pay

## 2020-09-06 ENCOUNTER — Encounter: Payer: Self-pay | Admitting: Pharmacist

## 2020-09-06 ENCOUNTER — Ambulatory Visit (INDEPENDENT_AMBULATORY_CARE_PROVIDER_SITE_OTHER): Payer: Medicaid Other | Admitting: Pharmacist

## 2020-09-06 VITALS — BP 122/84 | HR 85 | Wt 124.0 lb

## 2020-09-06 DIAGNOSIS — E108 Type 1 diabetes mellitus with unspecified complications: Secondary | ICD-10-CM

## 2020-09-06 NOTE — Patient Instructions (Addendum)
Ms. Bordner it was a pleasure seeing you today.   Please remember... 1. Sensor will last 10 days 2. Transmitter will last 90 days and must be reused 3. Sensor should be applied to area away from waistband, scarring, tattoos, irritation, and bones. 4. Transmitter must be within 20 feet of receiver/cell phone. 5. If using Dexcom G6 app on cell phone, please remember to keep app open (do not close out of app). 6. Do a fingerstick blood glucose test if the sensor readings do not match how you feel 7. Remove sensor prior to magnetic resonance imaging (MRI), computed tomography (CT) scan, or high-frequency electrical heat (diathermy) treatment. 8. Do not allow sun screen or insect repellant to come into contact with Dexcom G6. These skin care products may lead for the plastic used in the Dexcom G6 to crack. 9. Dexcom G6 may be worn through a Environmental education officer. It may not be exposed to an advanced Imaging Technology (AIT) body scanner (also called a millimeter wave scanner) or the baggage x-ray machine. Instead, ask for hand-wanding or full-body pat-down and visual inspection.  10. Doses of acetaminophen (Tylenol) >1 gram every 6 hours may cause false high readings. 11. Hydroxyurea (Hydrea, Droxia) may interfere with accuracy of blood glucose readings from Dexcom G6. 12. Store sensor kit between 36 and 86 degrees Farenheit. Can be refrigerated within this temperature range.  Ordering Overlay Patches 1. Go to the following website every 30 days to order new overlay patches:  [email or web address]  Problems with Dexcom sticking? 1. Order Skin Tac from [facility]. Alcohol swab area you plan to administer Dexcom then let dry. Once dry, apply Skin Tac in a circular motion (with a spot in the middle for sensor without skin tac) and let dry. Once dry you can apply Dexcom!  Problems taking off Dexcom? 1. Remember to try to shower before removing Dexcom 2. Order Tac Away  to help remove any extra adhesive left on your skin once you remove [facility]  Dexcom Customer Service Information 1. Customer Sales Support (dexcom orders and general customer questions) Phone number: [phone] Monday - Friday  6 AM - 5 PM PST Saturday 8 AM - 4 PM PST  *Contact if you do not receive overlay patches  2. Global Technical Support (product troubleshooting or replacement inquiries) Phone number: [phone] Available 24 hours a day; 7 days a week  *Contact if you have a "bad" sensor. Remember to tell them you are wearing Dexcom on your stomach!  3. Dexcom Care (provides dexcom CGM training, software downloads, and tutorials) Phone number: [phone] Monday - Friday 6 AM - 5 PM PST Saturday 7 AM - 1:30 PM PST (All hours subject to change)  4. Website: [email or web address].[email or web address].com/  Please do the following:  1. Increase Lantus by 2 units to 17 units daily as directed today during your appointment. If you have any questions or if you believe something has occurred because of this change, call me or your doctor to let one of [place] know.  2. Continue checking blood sugars at home. It's really important that you record these and bring these in to your next doctor's appointment.  3. Continue making the lifestyle changes we've discussed together during our visit. Diet and exercise play a significant role in improving your blood sugars.  4. Follow-up with me via telephone in one week and then face-to-face in 4 weeks.   Hypoglycemia or low blood sugar:   Low blood sugar can happen quickly and may become an emergency if  not treated right away.   While this shouldn't happen often, it can be brought upon if you skip a meal or do not eat enough. Also, if your insulin or other diabetes medications are dosed too high, this can cause your blood sugar to go to low.   Warning signs of low blood sugar include: 1. Feeling shaky or dizzy 2. Feeling weak or tired  3. Excessive  hunger 4. Feeling anxious or upset  5. Sweating even when you aren't exercising  What to do if I experience low blood sugar? Follow the Rule of 15 1. Check your blood sugar. If lower than 70, proceed to step 2.  2. Treat with 15 grams of fast acting carbs which is found in 3-4 glucose tablets. If none are available you can try hard candy, 1 tablespoon of sugar or honey,4 ounces of fruit juice, or 6 ounces of REGULAR soda.  3. Re-check your sugar in 15 minutes. If it is still below 70, do what you did in step 2 again. If your blood sugar has come back up, go ahead and eat a snack or small meal made up of complex carbs (ex. Whole grains) and protein at this time to avoid recurrence of low blood sugar.

## 2020-09-06 NOTE — Progress Notes (Signed)
S:     PMH significant for T1DM, DKA (12/2019), depression   Patient presents today for diabetes management. Patient was referred and last seen by Primary Care Provider on  01/05/20.  Patient reports diabetes was diagnosed in 2017  Insurance coverage/medication affordability: Medicaid  Patient denies adherence with medications. Misses 1-2 doses of Novolog a week, typically in the morning or at lunch as she dislikes injecting four times a day.  Current diabetes medications include: Novolog 8 units TID with meals, Lantus 15 units once daily (AM)  Patient reported dietary habits:  Eats 2-3 meals/day and 2 snacks/day; Boluses with most every meal Breakfast: typically skips, however, if she does eat it will be small such as banana or bowl of cereal (Captain Crunch/Cinnamon Toast Crunch) with 2% milk Lunch: chicken wrap Dinner: aiming to eat more salad and fish but typically has something quick such as spaghetti or frozen TV dinner  Snacks: cookies or chips Drinks: mostly water but enjoys apple juice, orange juice, gatorade, and occasionally sprite  Patient-reported exercise habits: just purchased membership at [employer] but has not begun regularly exercising yet   Patient denies hypoglycemic events. Patient denies polyuria (increased urination).  Patient denies polyphagia (increased appetite).  Patient denies polydipsia (increased thirst).  Patient denies neuropathy (nerve pain). Patient denies visual changes. Patient reports self foot exams.    Patient taking >1 gram acetaminophen every 6 hours: denies Patient taking hydroxyrea: denies  O:     Labs:   Vitals:   09/06/20 0912 09/06/20 0918  BP:  122/84  Pulse: 85 85  SpO2: 98% 98%    Lab Results  Component Value Date   HGBA1C 9.4 (A) 11/14/2019   HGBA1C 11.0 (H) 06/18/2019   HGBA1C 10.6 (H) 02/20/2019    Lab Results  Component Value Date   CPEPTIDE 1.0 (L) 09/20/2015       Component Value Date/Time    CHOL 237 (H) 10/14/2018 0220   TRIG 45 10/14/2018 0220   HDL 70 10/14/2018 0220   CHOLHDL 3.4 10/14/2018 0220   VLDL 9 10/14/2018 0220   LDLCALC 158 (H) 10/14/2018 0220    No results found for: MICRALBCREAT  PHQ-9 Score: patient did not complete  Assessment: T1DM longstanding since 2017 is not controlled likely due to diet and exercise habits as well as lack of routine follow-up for further diabetes management. Patient previously has history of frequent symptomatic hypoglycemia but is now steadily running hyperglycemic, although denies any symptoms. Extensively discussed lifestyle modifications, specifically around diet improvement. Recommended trying sprite zero and gatorade zero as well as limiting juice consumption and diluting with water when doing so. In addition, recommended alternatives for breakfast and dinner to minimize carb and sugar intake and increase protein and vegetables. Patient states plan is attainable and will begin making necessary changes for improvement in blood glucose. Patient has had consistently elevated fasting blood glucose readings above 200's, therefore will increase Lantus from 15 units to 17 units (11.7%) while monitoring for hypoglycemic events. Patient instructed to decrease back down to 15 units if she begins experiencing nighttime hypoglycemia before follow-up in one week. Patient verbalized proper plan.  Plan: 1. Increase Lantus to 17 units once daily (AM) 2. Continue to use Dexcom G6 CGM 3. Extensively discussed pathophysiology of diabetes, recommended lifestyle interventions, dietary effects on blood sugar control 4. Patient set goal to go to gym once a week for one hour and then increase from there 5. Counseled on s/sx of and management of hypoglycemia  6. Next A1C anticipated at next face-to-face visit.  Follow-up appointment via phone call in one week to review sugar readings. Face-to-face follow up in four weeks.Written patient instructions  provided.  This appointment required 60 minutes of patient care (this includes precharting, chart review, review of results, and face-to-face care).  Thank you for involving pharmacy to assist in providing this patient's care.  Patient seen with Jeanmarie Hubert, PharmD Candidate [id number]  Discussed patient and recommendations with Dr. Sherren Mocha McDiarmid, MD

## 2020-09-15 ENCOUNTER — Other Ambulatory Visit: Payer: Self-pay | Admitting: Family Medicine

## 2020-09-15 ENCOUNTER — Encounter: Payer: Self-pay | Admitting: Family Medicine

## 2020-09-15 ENCOUNTER — Telehealth: Payer: Self-pay

## 2020-09-15 DIAGNOSIS — E108 Type 1 diabetes mellitus with unspecified complications: Secondary | ICD-10-CM
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
## 2020-09-16 ENCOUNTER — Other Ambulatory Visit: Payer: Self-pay

## 2020-09-16 ENCOUNTER — Ambulatory Visit (INDEPENDENT_AMBULATORY_CARE_PROVIDER_SITE_OTHER): Payer: Medicaid Other | Admitting: Family Medicine

## 2020-09-16 VITALS — BP 100/60 | HR 89 | Ht 67.0 in | Wt 123.4 lb

## 2020-09-16 DIAGNOSIS — M79645 Pain in left finger(s): Secondary | ICD-10-CM

## 2020-09-16 DIAGNOSIS — T7491XA Unspecified adult maltreatment, confirmed, initial encounter: Secondary | ICD-10-CM

## 2020-09-16 DIAGNOSIS — M25561 Pain in right knee: Secondary | ICD-10-CM | POA: Insufficient documentation

## 2020-09-16 HISTORY — DX: Unspecified adult maltreatment, confirmed, initial encounter: T74.91XA

## 2020-09-16 HISTORY — DX: Pain in right knee: M25.561

## 2020-09-16 HISTORY — DX: Pain in left finger(s): M79.645
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
## 2020-09-16 NOTE — Assessment & Plan Note (Signed)
Very unlikely fracture, but given history of trauma and the patient is still limping secondary to pain, will go ahead and obtain x-rays of her right knee.  She will have these performed likely tomorrow due to her schedule.  Advised her that she likely has a bone bruise which can take weeks to heal.  Can take Tylenol as needed for her pain.  Keep the overlying abrasion clean and dry, does not need to use Neosporin.

## 2020-09-16 NOTE — Patient Instructions (Addendum)
Thank you for coming to see me today. It was a pleasure. Today we talked about:   I have placed an order for x-rays of your finger and knee.  Please go to [place] [facility] to have this completed.  You do not need an appointment.  We will contact you with your results afterwards.  You are due for a Pap Smear.  Please schedule an appointment to come back at your earliest convenience to have this completed.   If you have any questions or concerns, please do not hesitate to call the office at [phone].  Best,   [place] Constable, DO

## 2020-09-17 NOTE — Telephone Encounter (Signed)
-----   Message from Zenia Resides, MD sent at 09/16/2020  9:56 AM EST ----- Regarding: RE: Interesting situation Sabrina Sutton, First, great job for taking the time to notice and for calling the patient.  Diversion of insulin is not as concerning as diversion of a controled substance.  That said, it is wrong and we need to do our part to make it stop.  I have copied Dr. Valentina Lucks to help with next steps.  I suspect our obligation will stop when we notify the pharmacy that this occurred.  I have also copied Janett Billow in case clinic staff become involved.   Good work.  Thanks for asking [place] ----- Message ----- From: Matilde Haymaker, MD Sent: 09/15/2020   8:20 PM EST To: Zenia Resides, MD Subject: Interesting situation                          Dr. Andria Frames,  I am covering the inbox for Dr. Maudie Mercury and I came across an interesting situation.  This patient seems to be getting refills of short acting insulin roughly every week.  Each refill should last her roughly 3 months.  So she seems to be going through this quickly.  I called and spoke with her and she reports that somebody other than herself for her mother has been picking up her medication from the pharmacy.  She reports that this is happened 3 times in the past month.  She reports that she currently has 1 insulin pen at home.  So I provided another 35-month supply of insulin.  I do not know this patient well but this seems very suspicious for diversion of her medication.  I do not know if there is anything else that can or should be done to look into this.  Do have any suggestions?  -Sabrina Sutton

## 2020-09-17 NOTE — Telephone Encounter (Signed)
Contacted patient RE insulin prescription dispensing issue.    Patient reports her mother had a prolonged interaction with the [facility] yesterday 09/16/2020 and she believed everything had been resolved.   I asked her to contact me if there were any additional issues with insulin access (diversion).  She agreed.   She denies any hypoglycemic episodes and states that she has worked on reducing intake of juices and regular soda over the last week.

## 2020-09-30 ENCOUNTER — Telehealth: Payer: Self-pay | Admitting: Pharmacist

## 2020-09-30 ENCOUNTER — Other Ambulatory Visit: Payer: Self-pay | Admitting: Family Medicine

## 2020-09-30 DIAGNOSIS — E108 Type 1 diabetes mellitus with unspecified complications: Secondary | ICD-10-CM

## 2020-09-30 MED ORDER — INPEN 100-PINK-NOVO DEVI
0 refills | Status: DC
Start: 2020-09-30 — End: 2022-07-03

## 2020-09-30 NOTE — Telephone Encounter (Signed)
Order placed. Thank you for your help!

## 2020-09-30 NOTE — Telephone Encounter (Signed)
Called patient to discuss benefits of inpen to help control blood glucose. Cost of inpen is $35 and patient was agreeable. Will have provider send in rx for inpen and scheduled f/u with patient in two weeks to discuss device.

## 2020-10-05 ENCOUNTER — Telehealth: Payer: Self-pay

## 2020-10-05 NOTE — Telephone Encounter (Signed)
Prior authorization for Dexcom supplies faxed to [place]. We should receive determination via fax.

## 2020-10-11 ENCOUNTER — Other Ambulatory Visit: Payer: Self-pay

## 2020-10-11 ENCOUNTER — Ambulatory Visit (INDEPENDENT_AMBULATORY_CARE_PROVIDER_SITE_OTHER): Payer: Medicaid Other | Admitting: Pharmacist

## 2020-10-11 VITALS — Wt 118.0 lb

## 2020-10-11 DIAGNOSIS — E108 Type 1 diabetes mellitus with unspecified complications: Secondary | ICD-10-CM

## 2020-10-11 NOTE — Patient Instructions (Addendum)
Sabrina Sutton it was a pleasure seeing you today.   Please do the following:  1. Continue your medications as directed today during your appointment. If you have any questions or if you believe something has occurred because of this change, call me or your doctor to let one of [place] know.  2. Continue checking blood sugars at home. It's really important that you record these and bring these in to your next doctor's appointment.  3. Continue making the lifestyle changes we've discussed together during our visit. Diet and exercise play a significant role in improving your blood sugars.  4. Follow-up with me once you receive your INPEN. 5. Place sticky note where you eat your meals to remind you to take your Novolog with dinner.   Hypoglycemia or low blood sugar:   Low blood sugar can happen quickly and may become an emergency if not treated right away.   While this shouldn't happen often, it can be brought upon if you skip a meal or do not eat enough. Also, if your insulin or other diabetes medications are dosed too high, this can cause your blood sugar to go to low.   Warning signs of low blood sugar include: 1. Feeling shaky or dizzy 2. Feeling weak or tired  3. Excessive hunger 4. Feeling anxious or upset  5. Sweating even when you aren't exercising  What to do if I experience low blood sugar? Follow the Rule of 15 1. Check your blood sugar with your meter. If lower than 70, proceed to step 2.  2. Treat with 15 grams of fast acting carbs which is found in 3-4 glucose tablets. If none are available you can try hard candy, 1 tablespoon of sugar or honey,4 ounces of fruit juice, or 6 ounces of REGULAR soda.  3. Re-check your sugar in 15 minutes. If it is still below 70, do what you did in step 2 again. If your blood sugar has come back up, go ahead and eat a snack or small meal made up of complex carbs (ex. Whole grains) and protein at this time to avoid recurrence of low blood sugar.

## 2020-10-11 NOTE — Progress Notes (Signed)
Subjective:    Patient ID: Sabrina Sutton, female    DOB: 1996/05/21, 25 y.o.   MRN: [id number]  HPI Patient is a 25 y.o. female who presents for diabetes management. She is in good spirits and presents without assistance. Patient was referred on 01/05/20 and last saw Dr. Sandi Carne on 09/16/20.  Patient reports diabetes was diagnosed in 2017  Insurance coverage/medication affordability: Medicaid  Current diabetes medications include: insulin glargine (LANTUS) 16-17 units daily and insulin aspart (NOVOLOG) 8 units TID with meals Patient states that they are taking their diabetes medications as prescribed. Patient denies adherence with medications. She states that they miss their NOVOLOG at least two times per week, on average.   Does you feel that your medications are working for you?  yes  Have you been experiencing any side effects to the medications prescribed? no  Do you have any problems obtaining medications due to transportation or finances?  no     Patient reported dietary habits:  Eats 2-3 meals/day and 1-2 snacks/day; Boluses with 2-3 meals/day Breakfast: sometimes skips but if she does will eat a yogurt Lunch: salads; occasionally fast food but is trying to cut back Dinner: baked chicken; salad or soup; ocassionally pasta Snacks: granola, fruit Drinks: water; switched to drinking more gatorade zero sugar rather than regular; has cut back on soda and is now drinking around once a week; apple or cranberry juice (rarely)  Patient-reported exercise habits: she recently purchased a gym membership at [employer] and began going 2x/week starting last week   Patient reports hypoglycemic events. Patient denies polyuria (increased urination).  Patient reports polyphagia (increased appetite).  Patient denies polydipsia (increased thirst).  Patient denies neuropathy (nerve pain). Patient denies visual changes. Patient reports self foot exams.   Objective:     Labs:    Vitals:   10/11/20 1530  Weight: 118 lb (53.5 kg)    Lab Results  Component Value Date   HGBA1C 9.4 (A) 11/14/2019   HGBA1C 11.0 (H) 06/18/2019   HGBA1C 10.6 (H) 02/20/2019   Lipid Panel     Component Value Date/Time   CHOL 237 (H) 10/14/2018 0220   TRIG 45 10/14/2018 0220   HDL 70 10/14/2018 0220   CHOLHDL 3.4 10/14/2018 0220   VLDL 9 10/14/2018 0220   LDLCALC 158 (H) 10/14/2018 0220    Clinical Atherosclerotic Cardiovascular Disease (ASCVD): No  The ASCVD Risk score Mikey Bussing DC Jr., et al., 2013) failed to calculate for the following reasons:   The 2013 ASCVD risk score is only valid for ages 37 to 53   PHQ-9 Score: did not complete  Assessment/Plan:   T1 is not controlled likely due to nonadherence to insulin therapy and high carbohydrate/sugar dietary choices. Medication adherence appears suboptimal. Patient has history of HYPER and HYPOglycemic episodes and responds best to small changes in insulin therapy. Based on patient's blood glucose readings consistently being elevated upon awakening, will increase patient's LANTUS from 16-17 units to 18 units daily in the AM. Following instruction patient verbalized understanding of treatment plan. Currently still working on getting patient access to Inpen device to improve blood glucose control. Upon receiving device will discuss in depth with patient. Patient agreed to place sticky notes in places where she mainly consumes meals to help remind her to take her NOVOLOG and improve adherence.  1. Increased dose of basal insulin glargine LANTUS to 18 units once daily in AM. 2. Continued  rapid insulin aspart NOVOLOG 8 units TID with meals.  3.  Extensively discussed pathophysiology of diabetes, recommended lifestyle interventions, dietary effects on blood sugar control 4. Patient will adhere to dietary modifications of decreasing carbohydrate/sugar intake until blood glucose is better controlled 5. Patient will exercise 2x/week with  goal to increase towards target of at least 150 minutes of moderate intensity exercise weekly 6. Counseled on s/sx of and management of hypoglycemia 7. Next A1C anticipated at next appointment.   Follow-up appointment in 4 weeks (or when she receives inpen device) to review sugar readings. Written patient instructions provided.  This appointment required 45 minutes of patient care (this includes precharting, chart review, review of results, and face-to-face care).  Thank you for involving pharmacy to assist in providing this patient's care.
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
## 2020-11-12 ENCOUNTER — Other Ambulatory Visit (HOSPITAL_COMMUNITY)
Admission: RE | Admit: 2020-11-12 | Discharge: 2020-11-12 | Disposition: A | Discharge status: Home / Self Care | Payer: Medicaid Other | Source: Ambulatory Visit | Attending: Family Medicine | Admitting: Family Medicine

## 2020-11-12 ENCOUNTER — Encounter: Payer: Self-pay | Admitting: Family Medicine

## 2020-11-12 ENCOUNTER — Ambulatory Visit (INDEPENDENT_AMBULATORY_CARE_PROVIDER_SITE_OTHER): Payer: Medicaid Other | Admitting: Family Medicine

## 2020-11-12 ENCOUNTER — Other Ambulatory Visit: Payer: Self-pay

## 2020-11-12 VITALS — BP 96/60 | HR 84 | Ht 67.0 in | Wt 123.4 lb

## 2020-11-12 DIAGNOSIS — F17200 Nicotine dependence, unspecified, uncomplicated: Secondary | ICD-10-CM | POA: Diagnosis not present

## 2020-11-12 DIAGNOSIS — E108 Type 1 diabetes mellitus with unspecified complications: Secondary | ICD-10-CM

## 2020-11-12 DIAGNOSIS — B3731 Acute candidiasis of vulva and vagina: Secondary | ICD-10-CM

## 2020-11-12 DIAGNOSIS — B373 Candidiasis of vulva and vagina: Secondary | ICD-10-CM | POA: Diagnosis not present

## 2020-11-12 DIAGNOSIS — R4589 Other symptoms and signs involving emotional state: Secondary | ICD-10-CM

## 2020-11-12 DIAGNOSIS — Z113 Encounter for screening for infections with a predominantly sexual mode of transmission: Secondary | ICD-10-CM

## 2020-11-12 DIAGNOSIS — Z124 Encounter for screening for malignant neoplasm of cervix: Secondary | ICD-10-CM | POA: Diagnosis not present

## 2020-11-12 DIAGNOSIS — Z1159 Encounter for screening for other viral diseases: Secondary | ICD-10-CM

## 2020-11-12 DIAGNOSIS — Z3009 Encounter for other general counseling and advice on contraception: Secondary | ICD-10-CM

## 2020-11-12 HISTORY — DX: Candidiasis of vulva and vagina: B37.3

## 2020-11-12 HISTORY — DX: Acute candidiasis of vulva and vagina: B37.31

## 2020-11-12 HISTORY — DX: Encounter for screening for infections with a predominantly sexual mode of transmission: Z11.3

## 2020-11-12 HISTORY — DX: Other symptoms and signs involving emotional state: R45.89

## 2020-11-12 LAB — POCT WET PREP (WET MOUNT)
Clue Cells Wet Prep Whiff POC: NEGATIVE
Trichomonas Wet Prep HPF POC: ABSENT

## 2020-11-12 LAB — POCT GLYCOSYLATED HEMOGLOBIN (HGB A1C): HbA1c, POC (controlled diabetic range): 12 % — AB (ref 0.0–7.0)

## 2020-11-12 LAB — POCT URINE PREGNANCY: Preg Test, Ur: NEGATIVE

## 2020-11-12 MED ORDER — FLUCONAZOLE 150 MG PO TABS: 150.0000 mg | ORAL_TABLET | Freq: Once | ORAL | 0 refills | Status: AC

## 2020-11-12 MED ORDER — NORGESTIM-ETH ESTRAD TRIPHASIC 0.18/0.215/0.25 MG-25 MCG PO TABS: 1.0000 | ORAL_TABLET | Freq: Every day | ORAL | 3 refills | Status: DC

## 2020-11-12 NOTE — Assessment & Plan Note (Signed)
Provided patient with list of counselors.  Also, if patient is interested in starting any medications, she can return to care.

## 2020-11-12 NOTE — Assessment & Plan Note (Addendum)
Counseled on contraception. UPT negative.  Discussed progesterone only versus low-dose ethinyl estradiol given patient's history of type 1 diabetes + smoker. Recommended tobacco cessation.  Ortho Tri-Cyclen Lo sent to the pharmacy.  Counseled in depth on importance of taking pills at the same time every day, what to do with missed doses, and return precautions for blood clot symptoms.  If she is having trouble taking the birth control pills, she should come back in to discuss other forms of birth control such as Mirena or Nexplanon.

## 2020-11-12 NOTE — Assessment & Plan Note (Signed)
Positive yeast on wet prep.  Diflucan sent to the pharmacy.  Tried to call patient to notify her with results without success.  No option to leave voicemail.  We will try to call again.

## 2020-11-12 NOTE — Assessment & Plan Note (Signed)
Patient reports that she is overall doing well and working with Sabrina Sutton from pharmacy is going very well.  I ordered an A1c and urine microalbumin today.

## 2020-11-12 NOTE — Patient Instructions (Addendum)
I will send you results via MyChart.  If there is anything abnormal, I will call you.  I sent birth control pills to the pharmacy.  Please make sure that you are taking these at the same time every day.  If you miss a pill, it is important that you use backup contraception such as condoms.  If you are having trouble taking the pills at the same time every day, please come in and we can discuss other options such as the IUD or Nexplanon which are much more reliable and user-friendly types of birth control.  Your PHQ-9 showed that you may be having some feelings of depression.  I have attached a list of counselors below.   Therapy and Counseling Resources Most providers on this list will take Medicaid. Patients with commercial insurance or Medicare should contact their insurance company to get a list of in network providers.  [facility]:Psychiatry and Counseling [address] [facility] [address] [place] [place], [place] [address] [place], [place], [place] [address]      [place] [address]  [place], [place] [address] [phone]  [place] [address], [place]  [place], [place] [address]       [phone]     [facility] (virtual only) [phone]  Jinny Blossom [facility] [address] [place], [place]  Family Solutions:  [place]. [place] [phone]  [facility]:  [place] [address] [phone]  [facility] (under & uninsured) [address], [place] [phone]    [email or web address]    [place] [address] . [place]    [phone]  [facility] [place]     Phone:  [phone]     [place] Schaller  [place] [address]       [place], [place] ext [place]: [place], [place], [place]   [place] ([place] therapist) [email or web address]  [place] [address]   [place] [address]    [phone]    [facility]   [address],  [place], [place]   [place]  [place]  [facility]  [address] [place], [place]        [phone]  Open Access/Walk In Clinic under & uninsured  [facility]  [address] [place], [place] Crisis [phone]  [facility] of the [place],  ([place])   [place], [place]: [phone]) 8:30 - 12; 1 - 2:30  Family Service of the [place],  [place], [place]    [phone]):8:30 - 12; 2 - 3PM  [facility] [employer],  [address]  [place]; [phone]):   Mon - Fri 8 AM - 5 PM  [facility] [place]  MWF 12:30 to 3:00 or call to schedule an appointment  [phone]  Specific Provider options Psychology Today  [email or web address] 1. click on find a therapist  2. enter your zip code 3. left side and select or tailor a therapist for your specific need.   [employer]  Center Provider Directory [email or web address]  (Medicaid)   Follow all drop down to find a provider  [place] [phone] or [email or web address] [address], [place], [place] Recovery support and educational   24- Hour Availability:  .  [facility]  . [place], [place] Crisis [phone]  . Family Service of the [employer] [phone]  [facility] Crisis Service  [phone]   . [place]  [phone] (after  hours)  . Therapeutic Alternative/Mobile Crisis   [phone]  . [place] National Suicide Hotline  [phone] ([place])  . Call 911 or go to emergency room  . [employer]  [phone]);  [place] and [employer]   . [facility] ACCESS  [phone]); [place], [place], [place], [place], [place], [place], [place]
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
## 2020-11-12 NOTE — Assessment & Plan Note (Signed)
Recommended cessation today, especially given her type 1 diabetes making her at high risk for cardiovascular complications.  We will continue to discuss.

## 2020-11-13 ENCOUNTER — Encounter: Payer: Self-pay | Admitting: Family Medicine

## 2020-11-13 ENCOUNTER — Other Ambulatory Visit: Payer: Self-pay | Admitting: Family Medicine

## 2020-11-13 DIAGNOSIS — E108 Type 1 diabetes mellitus with unspecified complications: Secondary | ICD-10-CM

## 2020-11-13 LAB — RPR: RPR Ser Ql: NONREACTIVE

## 2020-11-13 LAB — HIV ANTIBODY (ROUTINE TESTING W REFLEX): HIV Screen 4th Generation wRfx: NONREACTIVE

## 2020-11-13 LAB — HEPATITIS C ANTIBODY: Hep C Virus Ab: <0.1 s/co ratio (ref 0.0–0.9)

## 2020-11-13 LAB — MICROALBUMIN / CREATININE URINE RATIO
Creatinine, Urine: 67.3 mg/dL
Microalb/Creat Ratio: 4 mg/g creat (ref 0–29)
Microalbumin, Urine: 3 ug/mL

## 2020-11-15 ENCOUNTER — Encounter: Payer: Self-pay | Admitting: Family Medicine

## 2020-11-15 LAB — CYTOLOGY - PAP
Chlamydia: NEGATIVE
Comment: NEGATIVE
Comment: NORMAL
Diagnosis: NEGATIVE
Neisseria Gonorrhea: NEGATIVE

## 2020-11-16 ENCOUNTER — Other Ambulatory Visit: Payer: Self-pay | Admitting: Family Medicine

## 2020-11-16 DIAGNOSIS — E108 Type 1 diabetes mellitus with unspecified complications: Secondary | ICD-10-CM

## 2020-11-19 ENCOUNTER — Other Ambulatory Visit: Payer: Self-pay

## 2020-11-19 ENCOUNTER — Encounter (HOSPITAL_COMMUNITY): Payer: Self-pay

## 2020-11-19 ENCOUNTER — Emergency Department (HOSPITAL_COMMUNITY)
Admission: EM | Admit: 2020-11-19 | Discharge: 2020-11-19 | Disposition: A | Discharge status: Home / Self Care | Payer: Medicaid Other | Attending: Emergency Medicine | Admitting: Emergency Medicine

## 2020-11-19 DIAGNOSIS — R1110 Vomiting, unspecified: Secondary | ICD-10-CM | POA: Insufficient documentation

## 2020-11-19 DIAGNOSIS — E1010 Type 1 diabetes mellitus with ketoacidosis without coma: Secondary | ICD-10-CM | POA: Diagnosis not present

## 2020-11-19 DIAGNOSIS — F17210 Nicotine dependence, cigarettes, uncomplicated: Secondary | ICD-10-CM | POA: Diagnosis not present

## 2020-11-19 DIAGNOSIS — E1165 Type 2 diabetes mellitus with hyperglycemia: Secondary | ICD-10-CM | POA: Diagnosis not present

## 2020-11-19 DIAGNOSIS — R197 Diarrhea, unspecified: Secondary | ICD-10-CM | POA: Diagnosis not present

## 2020-11-19 DIAGNOSIS — R1111 Vomiting without nausea: Secondary | ICD-10-CM | POA: Diagnosis not present

## 2020-11-19 DIAGNOSIS — E108 Type 1 diabetes mellitus with unspecified complications: Secondary | ICD-10-CM

## 2020-11-19 DIAGNOSIS — Z794 Long term (current) use of insulin: Secondary | ICD-10-CM | POA: Insufficient documentation

## 2020-11-19 DIAGNOSIS — R112 Nausea with vomiting, unspecified: Secondary | ICD-10-CM

## 2020-11-19 DIAGNOSIS — R509 Fever, unspecified: Secondary | ICD-10-CM | POA: Diagnosis not present

## 2020-11-19 LAB — URINALYSIS, ROUTINE W REFLEX MICROSCOPIC
Bacteria, UA: NONE SEEN
Bilirubin Urine: NEGATIVE
Glucose, UA: >=500 mg/dL — AB
Hgb urine dipstick: NEGATIVE
Ketones, ur: 80 mg/dL — AB
Leukocytes,Ua: NEGATIVE
Nitrite: NEGATIVE
Protein, ur: 30 mg/dL — AB
Specific Gravity, Urine: 1.026 (ref 1.005–1.030)
pH: 5 (ref 5.0–8.0)

## 2020-11-19 LAB — CBC WITH DIFFERENTIAL/PLATELET
Abs Immature Granulocytes: 0.08 10*3/uL — ABNORMAL HIGH (ref 0.00–0.07)
Basophils Absolute: 0 10*3/uL (ref 0.0–0.1)
Basophils Relative: 0 %
Eosinophils Absolute: 0 10*3/uL (ref 0.0–0.5)
Eosinophils Relative: 0 %
HCT: 36.1 % (ref 36.0–46.0)
Hemoglobin: 12.4 g/dL (ref 12.0–15.0)
Immature Granulocytes: 1 %
Lymphocytes Relative: 16 %
Lymphs Abs: 2.6 10*3/uL (ref 0.7–4.0)
MCH: 29.1 pg (ref 26.0–34.0)
MCHC: 34.3 g/dL (ref 30.0–36.0)
MCV: 84.7 fL (ref 80.0–100.0)
Monocytes Absolute: 0.8 10*3/uL (ref 0.1–1.0)
Monocytes Relative: 5 %
Neutro Abs: 13.1 10*3/uL — ABNORMAL HIGH (ref 1.7–7.7)
Neutrophils Relative %: 78 %
Platelets: 316 10*3/uL (ref 150–400)
RBC: 4.26 MIL/uL (ref 3.87–5.11)
RDW: 13.7 % (ref 11.5–15.5)
WBC: 16.6 10*3/uL — ABNORMAL HIGH (ref 4.0–10.5)
nRBC: 0 % (ref 0.0–0.2)

## 2020-11-19 LAB — COMPREHENSIVE METABOLIC PANEL
ALT: 43 U/L (ref 0–44)
AST: 43 U/L — ABNORMAL HIGH (ref 15–41)
Albumin: 3.8 g/dL (ref 3.5–5.0)
Alkaline Phosphatase: 98 U/L (ref 38–126)
Anion gap: 12 (ref 5–15)
BUN: 15 mg/dL (ref 6–20)
CO2: 25 mmol/L (ref 22–32)
Calcium: 9 mg/dL (ref 8.9–10.3)
Chloride: 100 mmol/L (ref 98–111)
Creatinine, Ser: 0.71 mg/dL (ref 0.44–1.00)
GFR, Estimated: >60 mL/min (ref >60)
Glucose, Bld: 222 mg/dL — ABNORMAL HIGH (ref 70–99)
Potassium: 3 mmol/L — ABNORMAL LOW (ref 3.5–5.1)
Sodium: 137 mmol/L (ref 135–145)
Total Bilirubin: 0.7 mg/dL (ref 0.3–1.2)
Total Protein: 6.9 g/dL (ref 6.5–8.1)

## 2020-11-19 LAB — LIPASE, BLOOD: Lipase: 21 U/L (ref 11–51)

## 2020-11-19 MED ORDER — ONDANSETRON 8 MG PO TBDP: ORAL_TABLET | ORAL | 0 refills | Status: DC

## 2020-11-19 MED ORDER — SODIUM CHLORIDE 0.9 % IV BOLUS (SEPSIS)
1000.0000 mL | Freq: Once | INTRAVENOUS | Status: AC
  Administered 2020-11-19: 1000 mL via INTRAVENOUS

## 2020-11-19 MED ORDER — ONDANSETRON HCL 4 MG/2ML IJ SOLN
4.0000 mg | Freq: Once | INTRAMUSCULAR | Status: AC
  Administered 2020-11-19: 4 mg via INTRAVENOUS
  Filled 2020-11-19: qty 2

## 2020-11-19 MED ORDER — FENTANYL CITRATE (PF) 100 MCG/2ML IJ SOLN
50.0000 ug | Freq: Once | INTRAMUSCULAR | Status: AC
  Administered 2020-11-19: 50 ug via INTRAVENOUS
  Filled 2020-11-19: qty 2

## 2020-11-19 MED ORDER — DEXCOM G6 SENSOR MISC: 1.0000 | 11 refills | Status: DC
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
## 2020-11-19 NOTE — Discharge Instructions (Signed)
Follow-up with your primary care doctor for recheck early next week.  If your pain worsens, you develop vomiting again, fever, return to ER for reassessment.  Take Zofran as needed for nausea.

## 2020-11-19 NOTE — ED Triage Notes (Signed)
[name] from home and has been vomiting last 24 hours. Clear, green bile. Abdominal cramping that worsens when she vomits. Patient has had episodes like this in the past. Diabetic. CBG 347.   BP 137/87 HR 66 NSR RR 18 O2 98% [facility] gave 317mL fluid 4mg  IV Zofran  24G IV left wrist

## 2020-11-19 NOTE — ED Notes (Signed)
Unable to collect BP due to PT movement of arm

## 2020-11-19 NOTE — ED Provider Notes (Signed)
Signout note  25 year old with nausea and vomiting, no abdominal pain and abdomen soft.  Basic labs ordered.  Provided IV fluids and antiemetics.  Received signout from Dr. Christy Gentles, follow-up on UA and reassess patient  Reassessed patient, she has no ongoing symptoms, abdomen is soft on my exam, UA negative, will discharge home.   Sabrina Starch, MD 11/19/20 1049

## 2020-11-20 ENCOUNTER — Emergency Department (HOSPITAL_COMMUNITY)
Admission: EM | Admit: 2020-11-20 | Discharge: 2020-11-20 | Disposition: A | Discharge status: Home / Self Care | Payer: Medicaid Other | Attending: Emergency Medicine | Admitting: Emergency Medicine

## 2020-11-20 ENCOUNTER — Other Ambulatory Visit (HOSPITAL_COMMUNITY): Payer: Medicaid Other

## 2020-11-20 ENCOUNTER — Emergency Department (HOSPITAL_COMMUNITY): Payer: Medicaid Other

## 2020-11-20 ENCOUNTER — Encounter (HOSPITAL_COMMUNITY): Payer: Self-pay

## 2020-11-20 ENCOUNTER — Other Ambulatory Visit: Payer: Self-pay

## 2020-11-20 DIAGNOSIS — R739 Hyperglycemia, unspecified: Secondary | ICD-10-CM | POA: Diagnosis not present

## 2020-11-20 DIAGNOSIS — F17210 Nicotine dependence, cigarettes, uncomplicated: Secondary | ICD-10-CM | POA: Diagnosis not present

## 2020-11-20 DIAGNOSIS — R112 Nausea with vomiting, unspecified: Secondary | ICD-10-CM | POA: Insufficient documentation

## 2020-11-20 DIAGNOSIS — R945 Abnormal results of liver function studies: Secondary | ICD-10-CM | POA: Diagnosis not present

## 2020-11-20 DIAGNOSIS — R1111 Vomiting without nausea: Secondary | ICD-10-CM | POA: Diagnosis not present

## 2020-11-20 DIAGNOSIS — R110 Nausea: Secondary | ICD-10-CM | POA: Diagnosis not present

## 2020-11-20 DIAGNOSIS — E1110 Type 2 diabetes mellitus with ketoacidosis without coma: Secondary | ICD-10-CM | POA: Insufficient documentation

## 2020-11-20 DIAGNOSIS — R7989 Other specified abnormal findings of blood chemistry: Secondary | ICD-10-CM

## 2020-11-20 DIAGNOSIS — E876 Hypokalemia: Secondary | ICD-10-CM | POA: Diagnosis not present

## 2020-11-20 LAB — I-STAT VENOUS BLOOD GAS, ED
Acid-Base Excess: 2 mmol/L (ref 0.0–2.0)
Bicarbonate: 24.6 mmol/L (ref 20.0–28.0)
Calcium, Ion: 1 mmol/L — ABNORMAL LOW (ref 1.15–1.40)
HCT: 42 % (ref 36.0–46.0)
Hemoglobin: 14.3 g/dL (ref 12.0–15.0)
O2 Saturation: 65 %
Potassium: 3.4 mmol/L — ABNORMAL LOW (ref 3.5–5.1)
Sodium: 134 mmol/L — ABNORMAL LOW (ref 135–145)
TCO2: 26 mmol/L (ref 22–32)
pCO2, Ven: 30.2 mmHg — ABNORMAL LOW (ref 44.0–60.0)
pH, Ven: 7.518 — ABNORMAL HIGH (ref 7.250–7.430)
pO2, Ven: 30 mmHg — CL (ref 32.0–45.0)

## 2020-11-20 LAB — CBC
HCT: 39.3 % (ref 36.0–46.0)
Hemoglobin: 13.5 g/dL (ref 12.0–15.0)
MCH: 29.7 pg (ref 26.0–34.0)
MCHC: 34.4 g/dL (ref 30.0–36.0)
MCV: 86.4 fL (ref 80.0–100.0)
Platelets: 335 10*3/uL (ref 150–400)
RBC: 4.55 MIL/uL (ref 3.87–5.11)
RDW: 14.3 % (ref 11.5–15.5)
WBC: 13.1 10*3/uL — ABNORMAL HIGH (ref 4.0–10.5)
nRBC: 0 % (ref 0.0–0.2)

## 2020-11-20 LAB — URINALYSIS, ROUTINE W REFLEX MICROSCOPIC
Bilirubin Urine: NEGATIVE
Glucose, UA: >=500 mg/dL — AB
Hgb urine dipstick: NEGATIVE
Ketones, ur: 80 mg/dL — AB
Leukocytes,Ua: NEGATIVE
Nitrite: NEGATIVE
Protein, ur: 30 mg/dL — AB
Specific Gravity, Urine: 1.025 (ref 1.005–1.030)
pH: 5 (ref 5.0–8.0)

## 2020-11-20 LAB — COMPREHENSIVE METABOLIC PANEL
ALT: 70 U/L — ABNORMAL HIGH (ref 0–44)
AST: 90 U/L — ABNORMAL HIGH (ref 15–41)
Albumin: 3.7 g/dL (ref 3.5–5.0)
Alkaline Phosphatase: 117 U/L (ref 38–126)
Anion gap: 17 — ABNORMAL HIGH (ref 5–15)
BUN: 11 mg/dL (ref 6–20)
CO2: 19 mmol/L — ABNORMAL LOW (ref 22–32)
Calcium: 8.6 mg/dL — ABNORMAL LOW (ref 8.9–10.3)
Chloride: 99 mmol/L (ref 98–111)
Creatinine, Ser: 0.97 mg/dL (ref 0.44–1.00)
GFR, Estimated: >60 mL/min (ref >60)
Glucose, Bld: 268 mg/dL — ABNORMAL HIGH (ref 70–99)
Potassium: 3.6 mmol/L (ref 3.5–5.1)
Sodium: 135 mmol/L (ref 135–145)
Total Bilirubin: 1.6 mg/dL — ABNORMAL HIGH (ref 0.3–1.2)
Total Protein: 6.6 g/dL (ref 6.5–8.1)

## 2020-11-20 LAB — BASIC METABOLIC PANEL
Anion gap: 13 (ref 5–15)
BUN: 10 mg/dL (ref 6–20)
CO2: 20 mmol/L — ABNORMAL LOW (ref 22–32)
Calcium: 7.5 mg/dL — ABNORMAL LOW (ref 8.9–10.3)
Chloride: 103 mmol/L (ref 98–111)
Creatinine, Ser: 0.85 mg/dL (ref 0.44–1.00)
GFR, Estimated: >60 mL/min (ref >60)
Glucose, Bld: 231 mg/dL — ABNORMAL HIGH (ref 70–99)
Potassium: 2.9 mmol/L — ABNORMAL LOW (ref 3.5–5.1)
Sodium: 136 mmol/L (ref 135–145)

## 2020-11-20 LAB — I-STAT BETA HCG BLOOD, ED (MC, WL, AP ONLY): I-stat hCG, quantitative: <5 m[IU]/mL (ref <5)

## 2020-11-20 LAB — CBG MONITORING, ED: Glucose-Capillary: 239 mg/dL — ABNORMAL HIGH (ref 70–99)

## 2020-11-20 MED ORDER — SODIUM CHLORIDE 0.9 % IV BOLUS
1000.0000 mL | Freq: Once | INTRAVENOUS | Status: AC
  Administered 2020-11-20: 1000 mL via INTRAVENOUS

## 2020-11-20 MED ORDER — POTASSIUM CHLORIDE CRYS ER 20 MEQ PO TBCR
40.0000 meq | EXTENDED_RELEASE_TABLET | Freq: Once | ORAL | Status: AC
  Administered 2020-11-20: 40 meq via ORAL
  Filled 2020-11-20: qty 2

## 2020-11-20 MED ORDER — HALOPERIDOL LACTATE 5 MG/ML IJ SOLN
2.0000 mg | Freq: Once | INTRAMUSCULAR | Status: AC
  Administered 2020-11-20: 2 mg via INTRAVENOUS
  Filled 2020-11-20: qty 1

## 2020-11-20 NOTE — Discharge Instructions (Signed)
You are seen in the emergency department for nausea and vomiting.  That showed you to be dehydrated.  You were given fluids and nausea medication with improvement in your symptoms.  Please continue to monitor your sugars and drink plenty of fluids.  Return to the emergency department if any worsening or concerning symptoms.

## 2020-11-20 NOTE — ED Triage Notes (Signed)
EMS reports pt is from home. Started vomiting Friday night. Seen at [facility] yesterday and was discharged. Woke up this morning vomiting. Denies diarrhea. CBG - 217. BP - 128/70, HR - 70, RR - 18, O2 sat 100% on RA, Temp 96.9.

## 2020-11-20 NOTE — ED Notes (Signed)
Attempted an IV. Missed attempt. # 22 to right ac.

## 2020-11-20 NOTE — ED Notes (Signed)
Pt drinking ice water and Diet [place]

## 2020-11-20 NOTE — ED Provider Notes (Signed)
[facility] Emergency Department Provider Note MRN:  [id number]  Arrival date & time: 11/20/20     Chief Complaint   Emesis   History of Present Illness   Sabrina Sutton is a 25 y.o. year-old female with a history of diabetes presenting to the ED with chief complaint of emesis.  Persistent vomiting over the past several hours.  Recent evaluation in the emergency department and discharge after feeling better.  Symptoms have returned.  Denies any abdominal pain, no diarrhea or constipation.  Isolated nausea with multiple episodes of nonbloody nonbilious emesis.  Denies headache.  No chest pain or shortness of breath.  No fever.  No cough.  No burning with urination.  Review of Systems  A complete 10 system review of systems was obtained and all systems are negative except as noted in the HPI and PMH.   Patient's Health History    Past Medical History:  Diagnosis Date  . Diabetes mellitus without complication ([place]) 7/35/32   + GAD Ab  . DKA (diabetic ketoacidoses) 09/20/2015  . Hypokalemia   . Pyelonephritis 04/17/2016    Past Surgical History:  Procedure Laterality Date  . NO PAST SURGERIES      Family History  Problem Relation Age of Onset  . Diabetes Maternal Grandmother   . Heart disease Maternal Grandmother        Deceased from MI at age 52  . Hypertension Maternal Grandmother   . Hypercholesterolemia Mother   . Seizures Mother   . Kidney Stones Mother   . Hyperlipidemia Mother   . Stroke Maternal Grandfather        Deceased from stroke at age 24  . Hypertension Paternal Grandmother   . Healthy Father     Social History   Socioeconomic History  . Marital status: Single    Spouse name: Not on file  . Number of children: Not on file  . Years of education: Not on file  . Highest education level: Not on file  Occupational History  . Not on file  Tobacco Use  . Smoking status: Current Every Day Smoker    Packs/day: 0.25    Types:  Cigarettes  . Smokeless tobacco: Never Used  Vaping Use  . Vaping Use: Never used  Substance and Sexual Activity  . Alcohol use: Yes    Comment: Last drank 1 month ago per MD note  . Drug use: Yes    Types: Marijuana    Comment: Last used 1  month ago per MD note  . Sexual activity: Yes    Birth control/protection: Condom  Other Topics Concern  . Not on file  Social History Narrative   Lives with mom, sister, nephew and boyfriend attends [employer] is in the 10th grade.   Social Determinants of Health   Financial Resource Strain: Not on file  Food Insecurity: Not on file  Transportation Needs: Not on file  Physical Activity: Not on file  Stress: Not on file  Social Connections: Not on file  Intimate Partner Violence: Not on file     Physical Exam   Vitals:   11/20/20 0528 11/20/20 0630  BP: 113/72 122/82  Pulse: 62 71  Resp: 12 (!) 21  Temp: 98.1 F (36.7 C)   SpO2: 100% 100%    CONSTITUTIONAL: Well-appearing, NAD NEURO:  Alert and oriented x 3, no focal deficits EYES:  eyes equal and reactive ENT/NECK:  no LAD, no JVD CARDIO: Regular rate, well-perfused, normal S1 and S2  PULM:  CTAB no wheezing or rhonchi GI/GU:  normal bowel sounds, non-distended, non-tender MSK/SPINE:  No gross deformities, no edema SKIN:  no rash, atraumatic PSYCH:  Appropriate speech and behavior  *Additional and/or pertinent findings included in MDM below  Diagnostic and Interventional Summary    EKG Interpretation  Date/Time:    Ventricular Rate:    PR Interval:    QRS Duration:   QT Interval:    QTC Calculation:   R Axis:     Text Interpretation:        Labs Reviewed  CBC - Abnormal; Notable for the following components:      Result Value   WBC 13.1 (*)    All other components within normal limits  CBG MONITORING, ED - Abnormal; Notable for the following components:   Glucose-Capillary 239 (*)    All other components within normal limits  I-STAT VENOUS BLOOD GAS, ED  - Abnormal; Notable for the following components:   pH, Ven 7.518 (*)    pCO2, Ven 30.2 (*)    pO2, Ven 30.0 (*)    Sodium 134 (*)    Potassium 3.4 (*)    Calcium, Ion 1.00 (*)    All other components within normal limits  COMPREHENSIVE METABOLIC PANEL  URINALYSIS, ROUTINE W REFLEX MICROSCOPIC  BETA-HYDROXYBUTYRIC ACID  I-STAT BETA HCG BLOOD, ED ([facility], [facility], [facility] ONLY)    No orders to display    Medications  haloperidol lactate (HALDOL) injection 2 mg (2 mg Intravenous Given 11/20/20 0710)  sodium chloride 0.9 % bolus 1,000 mL (1,000 mLs Intravenous New Bag/Given 11/20/20 0710)     Procedures  /  Critical Care Procedures  ED Course and Medical Decision Making  I have reviewed the triage vital signs, the nursing notes, and pertinent available records from the EMR.  Listed above are laboratory and imaging tests that I personally ordered, reviewed, and interpreted and then considered in my medical decision making (see below for details).  Abdomen is soft and nontender, vital signs are normal, patient is in no acute distress.  Mildly hyperglycemic.  Considering DKA versus gastroparesis versus viral gastritis.  Awaiting labs, providing IV fluids, symptomatic management.  Signed out to oncoming provider at shift change.       Barth Kirks. Sedonia Small, [place] [email or web address]  Final Clinical Impressions(s) / ED Diagnoses     ICD-10-CM   1. Nausea and vomiting, intractability of vomiting not specified, unspecified vomiting type  R11.2     ED Discharge Orders    None       Discharge Instructions Discussed with and Provided to Patient:   Discharge Instructions   None       Maudie Flakes, MD 11/20/20 [phone]

## 2020-11-20 NOTE — ED Notes (Signed)
Urine requested  ?

## 2020-11-20 NOTE — ED Notes (Signed)
IV team at bedside 

## 2020-11-20 NOTE — ED Notes (Signed)
Patient transported to Ultrasound 

## 2020-11-20 NOTE — ED Provider Notes (Signed)
Signout from Dr. Sedonia Small.  25 year old with history of diabetes here with vomiting times hours.  History of same.  No abdominal pain.  No fevers.  Getting labs and fluids Haldol.  Plan is to follow-up on labs and if no significant derangements and symptoms are controlled she can be discharged for outpatient follow-up. Physical Exam  BP 122/82   Pulse 71   Temp 98.1 F (36.7 C) (Oral)   Resp (!) 21   Ht 5\' 7"  (1.702 m)   Wt 55.8 kg   LMP 11/04/2020 (Approximate)   SpO2 100%   BMI 19.26 kg/m   Physical Exam  ED Course/Procedures     Procedures  MDM  Patient's labs showing her bicarb is down and her glucose is up.  Likely reflecting some dehydration.  Also her LFTs are mildly elevated.  Have ordered right upper quadrant ultrasound.  On review of prior work-ups she has had elevated LFTs intermittently in the past.  Ultrasound and CAT scans have been negative in the past.  Ultrasound showing some pericholecystic fluid but no evidence of gallbladder thickening or sonographic Murphy's or gallstones.  Unclear significance.  1:30 PM.  Patient feels a lot better.  Gap is closed.  Bicarb still low but improved.  She is tolerating p.o. and feels like she can be discharged.  She did have some ketones in her urine but was alkalotic for her VBG.  Currently do not feel this is consistent with DKA.  She is comfortable plan for discharge and return instructions discussed       Hayden Rasmussen, MD 11/20/20 [phone]

## 2020-11-22 ENCOUNTER — Encounter: Payer: Self-pay | Admitting: Family Medicine

## 2020-11-23 NOTE — Progress Notes (Signed)
    SUBJECTIVE:   CHIEF COMPLAINT / HPI:   ED Follow up  Seen in the ED for N/V with elevated glucose, ALT/AST and bilirubin. Patient also had accompanying RUQ pain and [place] was negative. She reports vomiting resolved on Sunday with no issues since. Denies abdominal pain, n/v, d/c.   Additionally, ALT/AST elevated. Previously elevated values were seen with DKA episodes in chart. (11/12/18, 10/13/18, 08/21/18)  PERTINENT  PMH / PSH: DM I (poorly controlled)  OBJECTIVE:   BP 112/72   Pulse (!) 106   Wt 122 lb 6.4 oz (55.5 kg)   LMP 11/04/2020 (Approximate)   SpO2 98%   BMI 19.17 kg/m   General: non-toxic, NAD   ASSESSMENT/PLAN:   Elevated liver enzymes Has been elevated with previous DKA episodes. RUQ [place] negative. No further pain today. Possibly due to dehydration? Will recheck again today.   Type 1 diabetes mellitus with complications [facility]) Patient reports that she has had issues getting her Dexcom and won't get her next until the 9th.  Provided her with a Dexcom sensor today. Also reviewed her dexcom with Dr. Valentina Lucks. Sugars are 75% out of range.  Also scheduled her for follow-up with [facility] in early June for follow up.  Counseled patient on sugary drinks and suggested trying drink flavorings such as MIO. Reviewed medications with patient. She reports she is taking 15 units of lantus in the mornings with 8 units novolog TID with meals.  She also reported issues with getting her insulin because someone else was picking up her medication in the past. If this happens again, patient should call the office so that we can get her medication if not covered again by insurance.     Wilber Oliphant, MD [place]

## 2020-11-24 ENCOUNTER — Other Ambulatory Visit: Payer: Self-pay

## 2020-11-24 ENCOUNTER — Encounter: Payer: Self-pay | Admitting: Family Medicine

## 2020-11-24 ENCOUNTER — Ambulatory Visit (INDEPENDENT_AMBULATORY_CARE_PROVIDER_SITE_OTHER): Payer: Medicaid Other | Admitting: Family Medicine

## 2020-11-24 VITALS — BP 112/72 | HR 106 | Wt 122.4 lb

## 2020-11-24 DIAGNOSIS — E108 Type 1 diabetes mellitus with unspecified complications: Secondary | ICD-10-CM

## 2020-11-24 DIAGNOSIS — R748 Abnormal levels of other serum enzymes: Secondary | ICD-10-CM

## 2020-11-24 HISTORY — DX: Abnormal levels of other serum enzymes: R74.8

## 2020-11-24 NOTE — Assessment & Plan Note (Addendum)
Patient reports that she has had issues getting her Dexcom and won't get her next until the 9th.  Provided her with a Dexcom sensor today. Also reviewed her dexcom with Dr. Valentina Lucks. Sugars are 75% out of range.  Also scheduled her for follow-up with [facility] in early June for follow up.  Counseled patient on sugary drinks and suggested trying drink flavorings such as MIO. Reviewed medications with patient. She reports she is taking 15 units of lantus in the mornings with 8 units novolog TID with meals.  She also reported issues with getting her insulin because someone else was picking up her medication in the past. If this happens again, patient should call the office so that we can get her medication if not covered again by insurance.

## 2020-11-24 NOTE — Assessment & Plan Note (Signed)
Has been elevated with previous DKA episodes. RUQ [place] negative. No further pain today. Possibly due to dehydration? Will recheck again today.

## 2020-11-25 LAB — COMPREHENSIVE METABOLIC PANEL
ALT: 66 IU/L — ABNORMAL HIGH (ref 0–32)
AST: 19 IU/L (ref 0–40)
Albumin/Globulin Ratio: 2 (ref 1.2–2.2)
Albumin: 4.6 g/dL (ref 3.9–5.0)
Alkaline Phosphatase: 137 IU/L — ABNORMAL HIGH (ref 44–121)
BUN/Creatinine Ratio: 9 (ref 9–23)
BUN: 7 mg/dL (ref 6–20)
Bilirubin Total: 0.3 mg/dL (ref 0.0–1.2)
CO2: 23 mmol/L (ref 20–29)
Calcium: 9.9 mg/dL (ref 8.7–10.2)
Chloride: 99 mmol/L (ref 96–106)
Creatinine, Ser: 0.77 mg/dL (ref 0.57–1.00)
Globulin, Total: 2.3 g/dL (ref 1.5–4.5)
Glucose: 240 mg/dL — ABNORMAL HIGH (ref 65–99)
Potassium: 3.7 mmol/L (ref 3.5–5.2)
Sodium: 139 mmol/L (ref 134–144)
Total Protein: 6.9 g/dL (ref 6.0–8.5)
eGFR: 112 mL/min/{1.73_m2} (ref >59)

## 2020-12-06 NOTE — Progress Notes (Deleted)
    SUBJECTIVE:   CHIEF COMPLAINT / HPI:   Nausea, chills. Type 1 DM  PERTINENT  PMH / PSH: ***  OBJECTIVE:   There were no vitals taken for this visit.  ***  ASSESSMENT/PLAN:   No problem-specific Assessment & Plan notes found for this encounter.     Benay Pike, MD [place]   {    This will disappear when note is signed, click to select method of visit    :1}

## 2020-12-07 ENCOUNTER — Ambulatory Visit: Payer: Medicaid Other

## 2020-12-09 ENCOUNTER — Other Ambulatory Visit: Payer: Self-pay | Admitting: Family Medicine

## 2020-12-09 DIAGNOSIS — E108 Type 1 diabetes mellitus with unspecified complications: Secondary | ICD-10-CM

## 2020-12-27 ENCOUNTER — Ambulatory Visit: Payer: Medicaid Other | Admitting: Pharmacist

## 2021-01-26 ENCOUNTER — Other Ambulatory Visit: Payer: Self-pay | Admitting: Family Medicine

## 2021-01-26 DIAGNOSIS — E108 Type 1 diabetes mellitus with unspecified complications: Secondary | ICD-10-CM

## 2021-01-27 ENCOUNTER — Ambulatory Visit (HOSPITAL_COMMUNITY): Payer: Medicaid Other | Admitting: Clinical

## 2021-01-28 ENCOUNTER — Telehealth: Payer: Self-pay

## 2021-01-28 NOTE — Telephone Encounter (Signed)
Patient calls nurse line reporting N/V, excessive thirst, and high cbgs this morning. Patient reports she started feeling like this yesterday. Sugars were in the "high 300s", however started going down as of this morning. Last reading 240. Patient reports she can not keep any food or liquids down. She did take her lantus and novolog this morning. Given patients hx and sxs, I advised her to go to an UC or ED. Patient and father verbalized understanding.

## 2021-02-10 ENCOUNTER — Other Ambulatory Visit: Payer: Self-pay

## 2021-02-10 ENCOUNTER — Ambulatory Visit (INDEPENDENT_AMBULATORY_CARE_PROVIDER_SITE_OTHER): Payer: Medicaid Other | Admitting: Clinical

## 2021-02-10 DIAGNOSIS — F330 Major depressive disorder, recurrent, mild: Secondary | ICD-10-CM | POA: Diagnosis not present

## 2021-02-13 DIAGNOSIS — E108 Type 1 diabetes mellitus with unspecified complications: Secondary | ICD-10-CM

## 2021-02-16 MED ORDER — DEXCOM G6 TRANSMITTER MISC: 1.0000 | 0 refills | Status: DC
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
## 2021-02-25 ENCOUNTER — Ambulatory Visit: Payer: Self-pay | Admitting: Pharmacist

## 2021-03-04 ENCOUNTER — Other Ambulatory Visit: Payer: Self-pay

## 2021-03-04 ENCOUNTER — Ambulatory Visit (INDEPENDENT_AMBULATORY_CARE_PROVIDER_SITE_OTHER): Payer: Medicaid Other

## 2021-03-04 ENCOUNTER — Other Ambulatory Visit (HOSPITAL_COMMUNITY)
Admission: RE | Admit: 2021-03-04 | Discharge: 2021-03-04 | Disposition: A | Discharge status: Home / Self Care | Payer: Medicaid Other | Source: Ambulatory Visit | Attending: Family Medicine | Admitting: Family Medicine

## 2021-03-04 ENCOUNTER — Ambulatory Visit (INDEPENDENT_AMBULATORY_CARE_PROVIDER_SITE_OTHER): Payer: Medicaid Other | Admitting: Family Medicine

## 2021-03-04 ENCOUNTER — Encounter: Payer: Self-pay | Admitting: Family Medicine

## 2021-03-04 VITALS — BP 110/70 | HR 70 | Wt 121.0 lb

## 2021-03-04 DIAGNOSIS — Z1159 Encounter for screening for other viral diseases: Secondary | ICD-10-CM | POA: Diagnosis not present

## 2021-03-04 DIAGNOSIS — Z113 Encounter for screening for infections with a predominantly sexual mode of transmission: Secondary | ICD-10-CM

## 2021-03-04 DIAGNOSIS — Z23 Encounter for immunization: Secondary | ICD-10-CM

## 2021-03-04 DIAGNOSIS — Z0000 Encounter for general adult medical examination without abnormal findings: Secondary | ICD-10-CM | POA: Diagnosis not present

## 2021-03-04 DIAGNOSIS — Z114 Encounter for screening for human immunodeficiency virus [HIV]: Secondary | ICD-10-CM | POA: Diagnosis not present

## 2021-03-04 DIAGNOSIS — E108 Type 1 diabetes mellitus with unspecified complications: Secondary | ICD-10-CM | POA: Diagnosis not present

## 2021-03-04 LAB — POCT GLYCOSYLATED HEMOGLOBIN (HGB A1C): HbA1c, POC (controlled diabetic range): 11.5 % — AB (ref 0.0–7.0)

## 2021-03-04 NOTE — Assessment & Plan Note (Signed)
Per patient request, obtained vaginal swab for GC, chlamydia, trichomonas and blood sample for HIV, RPR today.  Patient has no concerns and no symptoms.  We will follow-up on results.

## 2021-03-04 NOTE — Progress Notes (Signed)
SUBJECTIVE:   CHIEF COMPLAINT / HPI:   Diabetes - A1c today 11.5, down slightly from 12.0 on 4/22 - Current regimen includes and Lantus 15 units daily and NovoLog 8 units 3 times daily with meals - Saw an endocrinologist when she was first diagnosed in 2017, but none recently - Has not been for routine diabetic eye exam, does not have eye doctor - Daily blood sugars reported to be in the 200s, Dexcom sensor shows many hours spent in high range - Has some low blood sugars in the 80s a couple times a week with symptoms of shakiness - Has low blood sugar in 60s about once every 2 weeks - Per patient, lows happen when she takes insulin and has a delay in eating food  Health records for disability application - Patient reports that she has an appointment with a disability physician on 9/9 - Requests records ahead of this appointment - Unsure what exact records she needs, believes she needs all of them - Discussed with patient having disability clinic fax our clinic a records request so we can send specific documents requested  STI screening - Patient requests routine STI screening - No specific exposure concerns - No symptoms currently  PERTINENT  PMH / PSH: T1DM, DKA, acute pyelonephritis, depression, HSV-2  OBJECTIVE:   BP 110/70   Pulse 70   Wt 121 lb (54.9 kg)   LMP 02/14/2021 (Approximate)   BMI 18.95 kg/m    PHQ-9:  Depression screen [facility] 2/9 03/04/2021 11/24/2020 11/12/2020  Decreased Interest '1 2 1  '$ Down, Depressed, Hopeless '2 3 3  '$ PHQ - 2 Score '3 5 4  '$ Altered sleeping '2 3 2  '$ Tired, decreased energy '2 3 2  '$ Change in appetite '2 3 1  '$ Feeling bad or failure about yourself  '2 2 2  '$ Trouble concentrating 0 0 0  Moving slowly or fidgety/restless 0 0 0  Suicidal thoughts 0 0 0  PHQ-9 Score '11 16 11  '$ Difficult doing work/chores Somewhat difficult - Very difficult  Some encounter information is confidential and restricted. Go to Review Flowsheets activity to see all data.      GAD-7: No flowsheet data found.   Physical Exam General: Awake, alert, oriented Cardiovascular: Regular rate and rhythm, S1 and S2 present, no murmurs auscultated Respiratory: Lung fields clear to auscultation bilaterally Vulva: Normal appearing vulva without rashes, lesions, or deformities Vagina: Pale pink rugated vaginal tissue without obvious lesions, physiologic discharge of whitish color, cervix without lesion or overt tenderness with swab  ASSESSMENT/PLAN:   Type 1 diabetes mellitus with complications (HCC) 123456 today 11.5, down slightly from 12.0 on 4/22.  Current regimen includes Lantus 15 units daily and NovoLog 8 units 3 times daily with meals.  Had previous appointment scheduled with Marzetta Merino, [facility] but could not make it due to a new job. - Increase Lantus from 15 units to 18 units daily - Appointment made with Jacqlyn Krauss, [facility] on Monday 8/15 at 3:30 pm - Referral to endocrinology - Referral to ophthalmology for routine diabetic eye exam  Screening examination for STD (sexually transmitted disease) Per patient request, obtained vaginal swab for GC, chlamydia, trichomonas and blood sample for HIV, RPR today.  Patient has no concerns and no symptoms.  We will follow-up on results.  Healthcare maintenance Patient amenable to first COVID and first HPV vaccine shots today.  She will come back in 1 month for COVID shot #2 of 2 and HPV shot #2 of 3.  Tolerated injection well, no complaints.     Ezequiel Essex, MD [place]

## 2021-03-04 NOTE — Patient Instructions (Signed)
It was wonderful to meet you today. Thank you for allowing me to be a part of your care. Below is a short summary of what we discussed at your visit today:  Diabetes -Increase your Lantus from 15 units to 18 units daily -Go to your appointment with Marzetta Merino, pharmacist, on Monday afternoon  Vaccines Today you received the first Covid vaccine and the HPV vaccine  You may experience some residual soreness at the injection site.  Gentle stretches and regular use of that arm will help speed up your recovery.  As the vaccines are giving your immune system a "practice run" against specific infections, you may feel a little under the weather for the next several days.  We recommend rest as needed and hydrating.  You will need to come back in one month for your COVID shot #2/2 and HPV #2/3.   STI screening - Today we obtained a vaginal swab to screen for gonorrhea, chlamydia, and trichomonas - We also obtained a blood sample to screen for HIV and syphilis - If the results are normal, I will send you a letter or MyChart message. If the results are abnormal, I will give you a call.      If you have any questions or concerns, please do not hesitate to contact us via phone or MyChart message.   Ezequiel Essex, MD

## 2021-03-04 NOTE — Assessment & Plan Note (Signed)
Patient amenable to first COVID and first HPV vaccine shots today.  She will come back in 1 month for COVID shot #2 of 2 and HPV shot #2 of 3.  Tolerated injection well, no complaints.

## 2021-03-04 NOTE — Assessment & Plan Note (Signed)
A1c today 11.5, down slightly from 12.0 on 4/22.  Current regimen includes Lantus 15 units daily and NovoLog 8 units 3 times daily with meals.  Had previous appointment scheduled with Marzetta Merino, [facility] but could not make it due to a new job. - Increase Lantus from 15 units to 18 units daily - Appointment made with Jacqlyn Krauss, [facility] on Monday 8/15 at 3:30 pm - Referral to endocrinology - Referral to ophthalmology for routine diabetic eye exam

## 2021-03-07 ENCOUNTER — Ambulatory Visit (INDEPENDENT_AMBULATORY_CARE_PROVIDER_SITE_OTHER): Payer: Medicaid Other | Admitting: Pharmacist

## 2021-03-07 ENCOUNTER — Other Ambulatory Visit: Payer: Self-pay

## 2021-03-07 DIAGNOSIS — E108 Type 1 diabetes mellitus with unspecified complications: Secondary | ICD-10-CM

## 2021-03-07 LAB — CERVICOVAGINAL ANCILLARY ONLY
Chlamydia: NEGATIVE
Comment: NEGATIVE
Comment: NEGATIVE
Comment: NORMAL
Neisseria Gonorrhea: NEGATIVE
Trichomonas: NEGATIVE

## 2021-03-07 NOTE — Patient Instructions (Signed)
Sabrina Sutton it was a pleasure seeing you today.   Please do the following:  Increase your Lantus every 2 days by 1 unit if your blood glucose was >150 for both of those days as directed today during your appointment. If you have any questions or if you believe something has occurred because of this change, call me or your doctor to let one of [place] know.  Continue checking blood sugars at home. It's really important that you record these and bring these in to your next doctor's appointment.  Continue making the lifestyle changes we've discussed together during our visit. Diet and exercise play a significant role in improving your blood sugars.  Follow-up with me in one week.    Hypoglycemia or low blood sugar:   Low blood sugar can happen quickly and may become an emergency if not treated right away.   While this shouldn't happen often, it can be brought upon if you skip a meal or do not eat enough. Also, if your insulin or other diabetes medications are dosed too high, this can cause your blood sugar to go to low.   Warning signs of low blood sugar include: Feeling shaky or dizzy Feeling weak or tired  Excessive hunger Feeling anxious or upset  Sweating even when you aren't exercising  What to do if I experience low blood sugar? Follow the Rule of 15 Check your blood sugar with your meter. If lower than 70, proceed to step 2.  Treat with 15 grams of fast acting carbs which is found in 3-4 glucose tablets. If none are available you can try hard candy, 1 tablespoon of sugar or honey,4 ounces of fruit juice, or 6 ounces of REGULAR soda.  Re-check your sugar in 15 minutes. If it is still below 70, do what you did in step 2 again. If your blood sugar has come back up, go ahead and eat a snack or small meal made up of complex carbs (ex. Whole grains) and protein at this time to avoid recurrence of low blood sugar.

## 2021-03-07 NOTE — Progress Notes (Signed)
Subjective:    Patient ID: Sabrina Sutton, female    DOB: 08/16/1995, 25 y.o.   MRN: [id number]  HPI Patient is a 25 y.o. female who presents for diabetes management. She is in good spirits and presents without assistance. Patient was referred on 01/04/21 and last saw Primary Care Provider on 03/04/21. Last seen in pharmacy clinic on 10/11/20.  Patient reports diabetes was diagnosed in 2017  Insurance coverage/medication affordability: Medicaid  Current diabetes medications include: insulin glargine (LANTUS) 18 units daily and insulin aspart (NOVOLOG) 8 units TID with meals Patient states that they are taking their diabetes medications as prescribed. Patient denies adherence with medications. She states that they miss their NOVOLOG at least two times per week, on average.   Does you feel that your medications are working for you?  yes  Have you been experiencing any side effects to the medications prescribed? no  Do you have any problems obtaining medications due to transportation or finances?  no     Patient reported dietary habits:  Eats 2-3 meals/day and 1-2 snacks/day; Boluses with 2-3 meals/day Breakfast: sometimes skips but if she does will eat a yogurt Lunch: salads; occasionally fast food but is trying to cut back Dinner: baked chicken; salad or soup; ocassionally pasta Snacks: granola, fruit Drinks: water; switched to mainly drinking gatorade zero sugar; has cut back on soda and is now drinking around once a week; apple or cranberry juice (rarely)   Patient reports hypoglycemic events. Patient denies polyuria (increased urination).  Patient denies polyphagia (increased appetite).  Patient denies polydipsia (increased thirst).  Patient denies neuropathy (nerve pain). Patient denies visual changes. Patient reports self foot exams.   Objective:     Labs:   Lab Results  Component Value Date   HGBA1C 11.5 (A) 03/04/2021   HGBA1C 12.0 (A) 11/12/2020   HGBA1C 9.4 (A)  11/14/2019   Lipid Panel     Component Value Date/Time   CHOL 237 (H) 10/14/2018 0220   TRIG 45 10/14/2018 0220   HDL 70 10/14/2018 0220   CHOLHDL 3.4 10/14/2018 0220   VLDL 9 10/14/2018 0220   LDLCALC 158 (H) 10/14/2018 0220    Clinical Atherosclerotic Cardiovascular Disease (ASCVD): No  The ASCVD Risk score Mikey Bussing DC Jr., et al., 2013) failed to calculate for the following reasons:   The 2013 ASCVD risk score is only valid for ages 25 to 20   PHQ-9 Score: did not complete  Assessment/Plan:   T1DM is not controlled likely due to inconsistent follow-up for diabetes management. Medication adherence appears suboptimal. Patient has history of HYPER and HYPOglycemic episodes and responds best to small changes in insulin therapy. Based on patient's blood glucose readings consistently being elevated upon awakening, will increase patient's Lantus by 1 unit every 2 days if FBG >150. Following instruction patient verbalized understanding of treatment plan.   Continued basal insulin glargine LANTUS to 18 units once daily in AM and titrate by 1 unit every 2 days if FBG >150. Continued  rapid insulin aspart NOVOLOG 8 units TID with meals.  Extensively discussed pathophysiology of diabetes, recommended lifestyle interventions, dietary effects on blood sugar control Patient will adhere to dietary modifications of decreasing carbohydrate/sugar intake until blood glucose is better controlled Patient will exercise 2x/week with goal to increase towards target of at least 150 minutes of moderate intensity exercise weekly Counseled on s/sx of and management of hypoglycemia Next A1C anticipated in November 2022  Follow-up appointment in 2 weeks to review sugar readings. Written  patient instructions provided.  This appointment required 20 minutes of direct patient care.  Thank you for involving pharmacy to assist in providing this patient's care.

## 2021-03-08 NOTE — Assessment & Plan Note (Signed)
T1DM is not controlled likely due to inconsistent follow-up for diabetes management. Medication adherence appears suboptimal. Patient has history of HYPER and HYPOglycemic episodes and responds best to small changes in insulin therapy. Based on patient's blood glucose readings consistently being elevated upon awakening, will increase patient's Lantus by 1 unit every 2 days if FBG >150. Following instruction patient verbalized understanding of treatment plan.   1. Continued basal insulin glargine LANTUS to 18 units once daily in AM and titrate by 1 unit every 2 days if FBG >150. 2. Continued  rapid insulin aspart NOVOLOG 8 units TID with meals.  3. Extensively discussed pathophysiology of diabetes, recommended lifestyle interventions, dietary effects on blood sugar control 4. Patient will adhere to dietary modifications of decreasing carbohydrate/sugar intake until blood glucose is better controlled 5. Patient will exercise 2x/week with goal to increase towards target of at least 150 minutes of moderate intensity exercise weekly 6. Counseled on s/sx of and management of hypoglycemia 7. Next A1C anticipated in November 2022  Follow-up appointment in 2 weeks to review sugar readings. Written patient instructions provided.

## 2021-03-09 NOTE — Addendum Note (Signed)
Addended by: Renard Hamper on: 03/09/2021 04:09 PM   Modules accepted: Orders

## 2021-03-18 ENCOUNTER — Other Ambulatory Visit: Payer: Self-pay | Admitting: Family Medicine

## 2021-03-18 ENCOUNTER — Other Ambulatory Visit: Payer: Self-pay

## 2021-03-18 ENCOUNTER — Telehealth: Payer: Medicaid Other | Admitting: Pharmacist

## 2021-03-18 DIAGNOSIS — E108 Type 1 diabetes mellitus with unspecified complications: Secondary | ICD-10-CM

## 2021-03-21 NOTE — Telephone Encounter (Signed)
Patient calls nurse line to check on status of rx.   Talbot Grumbling, RN

## 2021-03-31 ENCOUNTER — Other Ambulatory Visit: Payer: Self-pay

## 2021-03-31 ENCOUNTER — Ambulatory Visit (INDEPENDENT_AMBULATORY_CARE_PROVIDER_SITE_OTHER): Payer: Medicaid Other | Admitting: Pharmacist

## 2021-03-31 DIAGNOSIS — E108 Type 1 diabetes mellitus with unspecified complications: Secondary | ICD-10-CM

## 2021-03-31 MED ORDER — INSULIN ASPART (W/NIACINAMIDE) 100 UNIT/ML ~~LOC~~ SOPN: 8.0000 [IU] | PEN_INJECTOR | Freq: Three times a day (TID) | SUBCUTANEOUS | 0 refills | Status: DC

## 2021-03-31 NOTE — Progress Notes (Signed)
Subjective:    Patient ID: Sabrina Sutton, female    DOB: February 25, 1996, 25 y.o.   MRN: [id number]  HPI Patient is a 25 y.o. female who presents for diabetes management. She is in good spirits and presents without assistance. Patient was referred on 01/04/21 and last saw Primary Care Provider on 03/04/21. Last seen in pharmacy clinic on 03/07/21.  Patient reports diabetes was diagnosed in 2017  Insurance coverage/medication affordability: Medicaid  Current diabetes medications include: insulin glargine (LANTUS) 18 units daily and insulin aspart (NOVOLOG) 8 units TID with meals Patient states that they are taking their diabetes medications as prescribed. Patient reports adherence with medications.  Does you feel that your medications are working for you?  yes  Have you been experiencing any side effects to the medications prescribed? no  Do you have any problems obtaining medications due to transportation or finances?  no     Patient reported dietary habits:  Breakfast: bowl of cereal (honey bunches of oats with 2% milk) or eggs, bacon, biscuit Lunch: salad or leftovers Dinner: carb, protein, veggies Drinks: 1 soda a week, gatorade zero sugar, vitamin waters zero sugar, apple juice (2 small bottles/day) Snacks: chips, mini muffins Alcohol: inconsistent, maybe ~1 drink once in a while Breaks at work vary  Exercise: hasn't been going lately due to work   Patient reports hypoglycemic events. Patient denies polyuria (increased urination).  Patient denies polyphagia (increased appetite).  Patient denies polydipsia (increased thirst).  Patient denies neuropathy (nerve pain). Patient denies visual changes. Patient reports self foot exams.   Objective:     Labs:   Lab Results  Component Value Date   HGBA1C 11.5 (A) 03/04/2021   HGBA1C 12.0 (A) 11/12/2020   HGBA1C 9.4 (A) 11/14/2019   Lipid Panel     Component Value Date/Time   CHOL 237 (H) 10/14/2018 0220   TRIG 45  10/14/2018 0220   HDL 70 10/14/2018 0220   CHOLHDL 3.4 10/14/2018 0220   VLDL 9 10/14/2018 0220   LDLCALC 158 (H) 10/14/2018 0220    Clinical Atherosclerotic Cardiovascular Disease (ASCVD): No  The ASCVD Risk score (Arnett DK, et al., 2019) failed to calculate for the following reasons:   The 2019 ASCVD risk score is only valid for ages 51 to 63   PHQ-9 Score: did not complete  Assessment/Plan:   T1DM is not controlled likely due to inconsistent follow-up for diabetes management and sub-optimal diet and medication adherence. Patient has history of HYPER and HYPOglycemic episodes and responds best to small changes in insulin therapy. Will increase patient's dose of Lantus from 18 units to 20 units once daily. Also discussed with patient importance of only taking bolus insulin with meals as patient reports history of taking Novolog and then not consuming any food. Will switch from Novolog to [place] to allow patient ability to administer insulin closer to mealtime to help alleviate this problem. Following instruction patient verbalized understanding of treatment plan.   Increased dose of basal insulin glargine LANTUS to 20 units once daily in AM Switching rapid insulin aspart NOVOLOG to FIASP 8 units TID with meals and provided patient sample Extensively discussed pathophysiology of diabetes, recommended lifestyle interventions, dietary effects on blood sugar control Patient will adhere to dietary modifications of decreasing carbohydrate/sugar intake until blood glucose is better controlled Patient will exercise 2x/week with goal to increase towards target of at least 150 minutes of moderate intensity exercise weekly Counseled on s/sx of and management of hypoglycemia Next A1C anticipated in November  2022  Follow-up appointment in 2 weeks to review sugar readings. Written patient instructions provided.  This appointment required 30 minutes of direct patient care.  Thank you for involving  pharmacy to assist in providing this patient's care.  Patient seen with Elita Quick, PharmD - PGY-1 Resident.

## 2021-03-31 NOTE — Assessment & Plan Note (Signed)
T1DM is not controlled likely due to inconsistent follow-up for diabetes management and sub-optimal diet and medication adherence. Patient has history of HYPER and HYPOglycemic episodes and responds best to small changes in insulin therapy. Will increase patient's dose of Lantus from 18 units to 20 units once daily. Also discussed with patient importance of only taking bolus insulin with meals as patient reports history of taking Novolog and then not consuming any food. Will switch from Novolog to [place] to allow patient ability to administer insulin closer to mealtime to help alleviate this problem. Following instruction patient verbalized understanding of treatment plan.   1. Increased dose of basal insulin glargine LANTUS to 20 units once daily in AM 2. Switching rapid insulin aspart NOVOLOG to FIASP 8 units TID with meals.  3. Extensively discussed pathophysiology of diabetes, recommended lifestyle interventions, dietary effects on blood sugar control 4. Patient will adhere to dietary modifications of decreasing carbohydrate/sugar intake until blood glucose is better controlled 5. Patient will exercise 2x/week with goal to increase towards target of at least 150 minutes of moderate intensity exercise weekly 6. Counseled on s/sx of and management of hypoglycemia 7. Next A1C anticipated in November 2022  Follow-up appointment in 2 weeks to review sugar readings. Written patient instructions provided.

## 2021-03-31 NOTE — Patient Instructions (Addendum)
It was great to see you today!  Today we talked about your blood sugars. We talked about trying to keep a consistent diet. You can keep a food diary to help see what you are eating over the next 2 weeks.  Try to cut down on apple juice to 1 cup a day. You can add water to the apple juice to make it be "2 cups"  We are increasing your Lantus to 20 units every day. Try to take the Lantus when you wake up in the morning.   Stop taking your Novolog today.   Start taking Fiasp 8 units with breakfast, lunch and dinner. You can take the [place] with your first bite of food. Do not take the Fiasp if you are skipping your meal.   I will see you in-person on 9/22 at 9:30 am to see how you are doing.

## 2021-04-01 ENCOUNTER — Ambulatory Visit (INDEPENDENT_AMBULATORY_CARE_PROVIDER_SITE_OTHER): Payer: Medicaid Other | Admitting: Family Medicine

## 2021-04-01 ENCOUNTER — Encounter: Payer: Self-pay | Admitting: Family Medicine

## 2021-04-01 ENCOUNTER — Ambulatory Visit (INDEPENDENT_AMBULATORY_CARE_PROVIDER_SITE_OTHER): Payer: Self-pay

## 2021-04-01 VITALS — BP 101/59 | HR 94 | Ht 67.0 in | Wt 129.0 lb

## 2021-04-01 DIAGNOSIS — E108 Type 1 diabetes mellitus with unspecified complications: Secondary | ICD-10-CM

## 2021-04-01 DIAGNOSIS — N2889 Other specified disorders of kidney and ureter: Secondary | ICD-10-CM | POA: Diagnosis not present

## 2021-04-01 DIAGNOSIS — Z23 Encounter for immunization: Secondary | ICD-10-CM

## 2021-04-01 DIAGNOSIS — Z0000 Encounter for general adult medical examination without abnormal findings: Secondary | ICD-10-CM

## 2021-04-01 MED ORDER — BD PEN NEEDLE MINI U/F 31G X 5 MM MISC: 3 refills | Status: DC

## 2021-04-01 NOTE — Patient Instructions (Signed)
It was wonderful to see you today. Thank you for allowing me to be a part of your care. Below is a short summary of what we discussed at your visit today:  Vaccines Today you received the second COVID vaccine. You may experience some residual soreness at the injection site.  Gentle stretches and regular use of that arm will help speed up your recovery.  As the vaccines are giving your immune system a "practice run" against specific infections, you may feel a little under the weather for the next several days.  We recommend rest as needed and hydrating.  Flu vaccine You may get the flu vaccine at any point in our clinic with a nurse only visit.  Simply call the front desk and make an appointment.  This would be a short visit we get the vaccine and then make sure you do not have a reaction.  Diabetes Let us know if you need any more refills on your medications.  Please keep your appointments with Dr. Marzetta Merino.  Please bring all of your medications to every appointment!  If you have any questions or concerns, please do not hesitate to contact us via phone or MyChart message.   Ezequiel Essex, MD

## 2021-04-01 NOTE — Progress Notes (Signed)
    SUBJECTIVE:   CHIEF COMPLAINT / HPI:   Diabetes - currently due for labs  - last A1c 11.5 on 03/04/21, down slightly from 12.0 on 11/12/20 - has been working with clinical pharmacist Dr. Hughes Better on med management and tailoring - current regimen of Lantus 20 units daily, Fiasp 8 units TID (previously novolog, changed 9/8) - has not yet been able to pick up new Fiasp from pharmacy yet, given was just prescribed yesterday - patient is happy with dexcom system - she notices she has many fewer "HIGH" notifications since working with Dr. Georgina Peer - overall happy with regimen, no complaints - a few hypoglycemic episodes due to taking novolog and then not eating within appropriate time frame (this is why Dr. Georgina Peer witched her to [place])   PERTINENT  PMH / PSH: T1DM, depression, pyelonephritis, HSV-2, cannabis hyperemesis syndrome, renal mass  OBJECTIVE:   BP (!) 101/59   Pulse 94   Ht '5\' 7"'$  (1.702 m)   Wt 129 lb (58.5 kg)   LMP 03/22/2021   SpO2 100%   BMI 20.20 kg/m    PHQ-9:  Depression screen [facility] 2/9 04/01/2021 03/04/2021 11/24/2020  Decreased Interest '1 1 2  '$ Down, Depressed, Hopeless '1 2 3  '$ PHQ - 2 Score '2 3 5  '$ Altered sleeping '1 2 3  '$ Tired, decreased energy '1 2 3  '$ Change in appetite '1 2 3  '$ Feeling bad or failure about yourself  '1 2 2  '$ Trouble concentrating 0 0 0  Moving slowly or fidgety/restless 0 0 0  Suicidal thoughts 0 0 0  PHQ-9 Score '6 11 16  '$ Difficult doing work/chores - Somewhat difficult -  Some encounter information is confidential and restricted. Go to Review Flowsheets activity to see all data.     GAD-7: No flowsheet data found.   Physical Exam General: Awake, alert, oriented Cardiovascular: Regular rate and rhythm, S1 and S2 present, no murmurs auscultated Respiratory: Lung fields clear to auscultation bilaterally  ASSESSMENT/PLAN:   Healthcare maintenance Received COVID vaccine #2. Tolerated well, no side effects.   Type 1 diabetes mellitus  with complications [facility]) Patient has not yet picked up new Fiasp script from pharmacy, will use Novolog until she is able to pick up Fiasp. No complaints at this time. Will collect urine sample for UA and urine microalbumin plus blood sample for CMP.      Ezequiel Essex, MD [place]

## 2021-04-02 ENCOUNTER — Encounter: Payer: Self-pay | Admitting: Family Medicine

## 2021-04-02 LAB — URINALYSIS
Bilirubin, UA: NEGATIVE
Ketones, UA: NEGATIVE
Leukocytes,UA: NEGATIVE
Nitrite, UA: NEGATIVE
Protein,UA: NEGATIVE
RBC, UA: NEGATIVE
Specific Gravity, UA: 1.019 (ref 1.005–1.030)
Urobilinogen, Ur: 0.2 mg/dL (ref 0.2–1.0)
pH, UA: 6 (ref 5.0–7.5)

## 2021-04-02 LAB — MICROALBUMIN / CREATININE URINE RATIO
Creatinine, Urine: 93.9 mg/dL
Microalb/Creat Ratio: 6 mg/g creat (ref 0–29)
Microalbumin, Urine: 6 ug/mL

## 2021-04-03 NOTE — Assessment & Plan Note (Signed)
Patient has not yet picked up new Fiasp script from pharmacy, will use Novolog until she is able to pick up Fiasp. No complaints at this time. Will collect urine sample for UA and urine microalbumin plus blood sample for CMP.

## 2021-04-03 NOTE — Assessment & Plan Note (Signed)
Received COVID vaccine #2. Tolerated well, no side effects.

## 2021-04-07 ENCOUNTER — Other Ambulatory Visit: Payer: Self-pay

## 2021-04-07 ENCOUNTER — Ambulatory Visit (INDEPENDENT_AMBULATORY_CARE_PROVIDER_SITE_OTHER): Payer: Medicaid Other | Admitting: Physician Assistant

## 2021-04-07 ENCOUNTER — Encounter (HOSPITAL_COMMUNITY): Payer: Self-pay | Admitting: Physician Assistant

## 2021-04-07 VITALS — BP 111/66 | HR 105 | Ht 67.0 in | Wt 129.0 lb

## 2021-04-07 DIAGNOSIS — F331 Major depressive disorder, recurrent, moderate: Secondary | ICD-10-CM

## 2021-04-07 DIAGNOSIS — F5105 Insomnia due to other mental disorder: Secondary | ICD-10-CM | POA: Diagnosis not present

## 2021-04-07 DIAGNOSIS — F99 Mental disorder, not otherwise specified: Secondary | ICD-10-CM | POA: Diagnosis not present

## 2021-04-07 DIAGNOSIS — F411 Generalized anxiety disorder: Secondary | ICD-10-CM

## 2021-04-07 DIAGNOSIS — F419 Anxiety disorder, unspecified: Secondary | ICD-10-CM | POA: Insufficient documentation

## 2021-04-07 HISTORY — DX: Major depressive disorder, recurrent, moderate: F33.1

## 2021-04-07 MED ORDER — FLUOXETINE HCL 10 MG PO CAPS: 10.0000 mg | ORAL_CAPSULE | Freq: Every day | ORAL | 1 refills | Status: DC

## 2021-04-07 MED ORDER — TRAZODONE HCL 50 MG PO TABS
50.0000 mg | ORAL_TABLET | Freq: Every day | ORAL | 1 refills | Status: DC
Start: 2021-04-07 — End: 2022-03-29
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
## 2021-04-09 LAB — COMPREHENSIVE METABOLIC PANEL

## 2021-04-14 ENCOUNTER — Other Ambulatory Visit: Payer: Self-pay

## 2021-04-14 ENCOUNTER — Ambulatory Visit (INDEPENDENT_AMBULATORY_CARE_PROVIDER_SITE_OTHER): Payer: Medicaid Other | Admitting: Licensed Clinical Social Worker

## 2021-04-14 ENCOUNTER — Ambulatory Visit (INDEPENDENT_AMBULATORY_CARE_PROVIDER_SITE_OTHER): Payer: Medicaid Other | Admitting: Pharmacist

## 2021-04-14 DIAGNOSIS — E108 Type 1 diabetes mellitus with unspecified complications: Secondary | ICD-10-CM

## 2021-04-14 DIAGNOSIS — F331 Major depressive disorder, recurrent, moderate: Secondary | ICD-10-CM | POA: Diagnosis not present

## 2021-04-14 MED ORDER — INSULIN ASPART (W/NIACINAMIDE) 100 UNIT/ML ~~LOC~~ SOPN: 8.0000 [IU] | PEN_INJECTOR | Freq: Three times a day (TID) | SUBCUTANEOUS | 0 refills | Status: DC

## 2021-04-14 MED ORDER — LANTUS SOLOSTAR 100 UNIT/ML ~~LOC~~ SOPN: 25.0000 [IU] | PEN_INJECTOR | Freq: Every day | SUBCUTANEOUS | 0 refills | Status: DC

## 2021-04-14 NOTE — Patient Instructions (Signed)
Sabrina Sutton it was a pleasure seeing you today.   Please do the following:  Increase Lantus to 20 units daily. Continue taking Fiasp 8 units with meals. If you have a late night snack more than 2 hours after dinner, take Fiasp 2 units with the snack. as directed today during your appointment. If you have any questions or if you believe something has occurred because of this change, call me or your doctor to let one of [place] know.  Continue checking blood sugars at home. It's really important that you record these and bring these in to your next doctor's appointment.  Continue making the lifestyle changes we've discussed together during our visit. Diet and exercise play a significant role in improving your blood sugars.  Follow-up with me in 3 weeks on 04/28/2021 @ 10:30 am.    Hypoglycemia or low blood sugar:   Low blood sugar can happen quickly and may become an emergency if not treated right away.   While this shouldn't happen often, it can be brought upon if you skip a meal or do not eat enough. Also, if your insulin or other diabetes medications are dosed too high, this can cause your blood sugar to go to low.   Warning signs of low blood sugar include: Feeling shaky or dizzy Feeling weak or tired  Excessive hunger Feeling anxious or upset  Sweating even when you aren't exercising  What to do if I experience low blood sugar? Follow the Rule of 15 Check your blood sugar with your meter. If lower than 70, proceed to step 2.  Treat with 15 grams of fast acting carbs which is found in 3-4 glucose tablets. If none are available you can try hard candy, 1 tablespoon of sugar or honey,4 ounces of fruit juice, or 6 ounces of REGULAR soda.  Re-check your sugar in 15 minutes. If it is still below 70, do what you did in step 2 again. If your blood sugar has come back up, go ahead and eat a snack or small meal made up of complex carbs (ex. Whole grains) and protein at this time to avoid recurrence of  low blood sugar.

## 2021-04-14 NOTE — Progress Notes (Signed)
Subjective:    Patient ID: Sabrina Sutton, female    DOB: 1996/02/28, 25 y.o.   MRN: [id number]  HPI Patient is a 25 y.o. female who presents for diabetes management. She is in good spirits and presents without assistance. Patient was referred on 01/04/21 and last saw Primary Care Provider on 04/01/21. Last seen in pharmacy clinic on 03/31/21.  She reports feeling that switching from Novolog to [place] has worked better to manage her blood sugars. However, she feels like she needs to be on a more regimented scheduled. She is inquiring about setting alarms to remind her to eat and take her insulin as prescribed. Reports seeing blood sugars in the high 200's to high 300's.  Patient reports diabetes was diagnosed in 2017  Insurance coverage/medication affordability: Medicaid  Current diabetes medications include: insulin glargine (LANTUS) 20 units daily and insulin aspart (Fiasp) 8 units TID with meals Patient states that they are taking their diabetes medications as prescribed. Patient reports adherence with medications.  Does you feel that your medications are working for you?  yes  Have you been experiencing any side effects to the medications prescribed? no  Do you have any problems obtaining medications due to transportation or finances?  no     Patient reported dietary habits:  Breakfast: bowl of cereal (honey bunches of oats with 2% milk) or eggs, bacon, biscuit Lunch: salad or leftovers; sometimes will have tuna and crackers Dinner: carb, protein, veggies Drinks: 1 soda a week, gatorade zero sugar, vitamin waters zero sugar, apple juice (2 small bottles/day) Snacks: chips, mini muffins, recently bought regular no -sugar added yogurt and incorporated that Alcohol: inconsistent, maybe ~1 drink once in a while Breaks at work vary - however, has been able to eat more consistently at work Patient reports often eating late night snacks, sometimes 2-3 h after dinner  Exercise: hasn't been  going lately due to work   Patient reports hypoglycemic events. Reports one or two lows Patient denies polyuria (increased urination).  Patient denies polyphagia (increased appetite). Little bit, sometimes Patient denies polydipsia (increased thirst).  Patient denies neuropathy (nerve pain). Patient denies visual changes.  Patient reports self foot exams.   Objective:     Labs:   Lab Results  Component Value Date   HGBA1C 11.5 (A) 03/04/2021   HGBA1C 12.0 (A) 11/12/2020   HGBA1C 9.4 (A) 11/14/2019   Lipid Panel     Component Value Date/Time   CHOL 237 (H) 10/14/2018 0220   TRIG 45 10/14/2018 0220   HDL 70 10/14/2018 0220   CHOLHDL 3.4 10/14/2018 0220   VLDL 9 10/14/2018 0220   LDLCALC 158 (H) 10/14/2018 0220    Clinical Atherosclerotic Cardiovascular Disease (ASCVD): No  The ASCVD Risk score (Arnett DK, et al., 2019) failed to calculate for the following reasons:   The 2019 ASCVD risk score is only valid for ages 96 to 10   Assessment/Plan:   T1DM is not controlled likely due to inconsistent follow-up for diabetes management and sub-optimal diet and medication adherence. Patient has history of HYPER and HYPOglycemic episodes and responds best to small changes in insulin therapy. Will increase patient's dose of Lantus from 20 units to 25 units once daily. Patient has been able to consistently eat meals, though she does report variety (low carb vs high carb sometimes). She is taking Fiasp as prescribed. Will continue Fiasp 8 units with meals. Discussed with patient addition of 2 units of Fiasp when eating late night snacks if at least  2 hours after dinner, which will allow her to better control sugar throughout the night. Following instruction patient verbalized understanding of treatment plan.   Increased dose of basal insulin glargine LANTUS to 25 units once daily in AM Continued Fiasp 8 units TID with meals. Added 2 units Fiasp with evening snacks if at least 2h past  evening meal. Sent prescription to pharmacy Extensively discussed pathophysiology of diabetes, recommended lifestyle interventions, dietary effects on blood sugar control Patient will adhere to dietary modifications of decreasing carbohydrate/sugar intake until blood glucose is better controlled.  Counseled on s/sx of and management of hypoglycemia Next A1C anticipated in November 2022  Follow-up appointment in 2-3 weeks to review sugar readings. Written patient instructions provided.  This appointment required 30 minutes of direct patient care.  Thank you for involving pharmacy to assist in providing this patient's care.  Patient seen with Meyer Russel, PharmD Candidate, and Juluis Pitch, PharmD - PGY-1 Resident.

## 2021-04-14 NOTE — Assessment & Plan Note (Signed)
  T1DM is not controlled likely due to inconsistent follow-up for diabetes management and sub-optimal diet and medication adherence. Patient has history of HYPER and HYPOglycemic episodes and responds best to small changes in insulin therapy. Will increase patient's dose of Lantus from 20 units to 25 units once daily. Patient has been able to consistently eat meals, though she does report variety (low carb vs high carb sometimes). She is taking Fiasp as prescribed. Will continue Fiasp 8 units with meals. Discussed with patient addition of 2 units of Fiasp when eating late night snacks if at least 2 hours after dinner, which will allow her to better control sugar throughout the night. Following instruction patient verbalized understanding of treatment plan.   1. Increased dose of basal insulin glargine LANTUS to 25 units once daily in AM 2. Continued Fiasp 8 units TID with meals. Added 2 units Fiasp with evening snacks if at least 2h past evening meal. Sent prescription to pharmacy 3. Extensively discussed pathophysiology of diabetes, recommended lifestyle interventions, dietary effects on blood sugar control 4. Patient will adhere to dietary modifications of decreasing carbohydrate/sugar intake until blood glucose is better controlled.  5. Counseled on s/sx of and management of hypoglycemia 6. Next A1C anticipated in November 2022  Follow-up appointment in 2-3 weeks to review sugar readings. Written patient instructions provided.
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
## 2021-04-28 ENCOUNTER — Ambulatory Visit: Payer: Self-pay | Admitting: Pharmacist

## 2021-05-18 ENCOUNTER — Other Ambulatory Visit: Payer: Self-pay | Admitting: Family Medicine

## 2021-05-18 DIAGNOSIS — E108 Type 1 diabetes mellitus with unspecified complications: Secondary | ICD-10-CM

## 2021-05-26 ENCOUNTER — Encounter (HOSPITAL_COMMUNITY): Payer: Medicaid Other | Admitting: Physician Assistant

## 2021-05-31 ENCOUNTER — Ambulatory Visit (HOSPITAL_COMMUNITY): Payer: Medicaid Other | Admitting: Licensed Clinical Social Worker

## 2021-06-23 ENCOUNTER — Ambulatory Visit (HOSPITAL_COMMUNITY): Payer: Medicaid Other | Admitting: Licensed Clinical Social Worker

## 2021-07-13 ENCOUNTER — Other Ambulatory Visit: Payer: Self-pay | Admitting: Family Medicine

## 2021-07-13 DIAGNOSIS — E108 Type 1 diabetes mellitus with unspecified complications: Secondary | ICD-10-CM

## 2021-07-14 ENCOUNTER — Other Ambulatory Visit: Payer: Self-pay | Admitting: *Deleted

## 2021-07-14 ENCOUNTER — Ambulatory Visit (HOSPITAL_COMMUNITY): Payer: Medicaid Other | Admitting: Licensed Clinical Social Worker

## 2021-07-14 DIAGNOSIS — E108 Type 1 diabetes mellitus with unspecified complications: Secondary | ICD-10-CM

## 2021-07-15 MED ORDER — LANTUS SOLOSTAR 100 UNIT/ML ~~LOC~~ SOPN
25.0000 [IU] | PEN_INJECTOR | Freq: Every day | SUBCUTANEOUS | 0 refills | Status: DC
Start: 2021-07-15 — End: 2021-08-30

## 2021-07-20 ENCOUNTER — Other Ambulatory Visit: Payer: Self-pay | Admitting: *Deleted

## 2021-07-20 MED ORDER — INSULIN ASPART (W/NIACINAMIDE) 100 UNIT/ML ~~LOC~~ SOPN: 8.0000 [IU] | PEN_INJECTOR | Freq: Three times a day (TID) | SUBCUTANEOUS | 0 refills | Status: DC

## 2021-07-28 ENCOUNTER — Other Ambulatory Visit: Payer: Self-pay | Admitting: *Deleted

## 2021-07-28 MED ORDER — ACCU-CHEK AVIVA PLUS VI STRP: 1.0000 | ORAL_STRIP | 11 refills | Status: DC

## 2021-08-30 ENCOUNTER — Other Ambulatory Visit: Payer: Self-pay | Admitting: Family Medicine

## 2021-08-30 DIAGNOSIS — E108 Type 1 diabetes mellitus with unspecified complications: Secondary | ICD-10-CM

## 2021-09-05 ENCOUNTER — Other Ambulatory Visit: Payer: Self-pay | Admitting: Family Medicine

## 2021-09-05 ENCOUNTER — Encounter: Payer: Self-pay | Admitting: Family Medicine

## 2021-09-05 ENCOUNTER — Encounter: Payer: Self-pay | Admitting: Pharmacist

## 2021-09-05 MED ORDER — INSULIN ASPART (W/NIACINAMIDE) 100 UNIT/ML ~~LOC~~ SOPN: 8.0000 [IU] | PEN_INJECTOR | Freq: Three times a day (TID) | SUBCUTANEOUS | 3 refills | Status: DC

## 2021-09-06 ENCOUNTER — Telehealth: Payer: Self-pay

## 2021-09-06 ENCOUNTER — Ambulatory Visit (HOSPITAL_COMMUNITY)
Admission: EM | Admit: 2021-09-06 | Discharge: 2021-09-06 | Payer: Medicaid Other | Attending: Emergency Medicine | Admitting: Emergency Medicine

## 2021-09-06 ENCOUNTER — Other Ambulatory Visit (HOSPITAL_COMMUNITY): Payer: Self-pay

## 2021-09-06 ENCOUNTER — Encounter (HOSPITAL_COMMUNITY): Payer: Self-pay | Admitting: Emergency Medicine

## 2021-09-06 ENCOUNTER — Other Ambulatory Visit: Payer: Self-pay

## 2021-09-06 DIAGNOSIS — R112 Nausea with vomiting, unspecified: Secondary | ICD-10-CM

## 2021-09-06 DIAGNOSIS — E108 Type 1 diabetes mellitus with unspecified complications: Secondary | ICD-10-CM

## 2021-09-06 LAB — CBG MONITORING, ED: Glucose-Capillary: 116 mg/dL — ABNORMAL HIGH (ref 70–99)

## 2021-09-06 MED ORDER — ONDANSETRON HCL 4 MG/2ML IJ SOLN
4.0000 mg | Freq: Once | INTRAMUSCULAR | Status: AC
  Administered 2021-09-06: 4 mg via INTRAMUSCULAR

## 2021-09-06 NOTE — ED Triage Notes (Signed)
Pt reports left side flank pain and 5 episodes of vomiting this morning. Hx of Diabetes

## 2021-09-06 NOTE — ED Notes (Signed)
Patient reports mother is coming

## 2021-09-06 NOTE — ED Triage Notes (Signed)
Pt called by this writer multiple times in front lobby and Pt was called by greater. Pt did not answer.

## 2021-09-06 NOTE — ED Notes (Signed)
Escorted patient to family members car by wheelchair

## 2021-09-06 NOTE — ED Notes (Addendum)
Canceled text

## 2021-09-06 NOTE — Telephone Encounter (Signed)
A Prior Authorization was initiated for this patients FIASP FLEXTOUCH through CoverMyMeds.   Key: [id number]

## 2021-09-06 NOTE — ED Notes (Addendum)
Deleted text
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
## 2021-09-06 NOTE — ED Notes (Signed)
Patient returned from going to bathroom, to her treatment room.  Patient walks slouched foreward.  Whenasked if she was vomiting less, patient started crying again and said she didn't know

## 2021-09-06 NOTE — ED Notes (Signed)
Patient is being discharged from the Urgent Care and sent to the Emergency Department via POV.  Per Lowella Petties, NP, patient is in need of higher level of care due to uncontrolled vomiting / diabetes Patient is aware and verbalizes understanding of plan of care.  Vitals:   09/06/21 1331  BP: 124/88  Pulse: 84  Resp: 18  Temp: 99.1 F (37.3 C)  SpO2: 97%

## 2021-09-07 MED ORDER — INSULIN LISPRO (1 UNIT DIAL) 100 UNIT/ML (KWIKPEN): PEN_INJECTOR | SUBCUTANEOUS | 11 refills | Status: DC

## 2021-09-07 NOTE — Telephone Encounter (Signed)
Ordered Humalog Kwikpen as substitute for [employer].   Ezequiel Essex, MD

## 2021-09-07 NOTE — Addendum Note (Signed)
Addended by: Renard Hamper on: 09/07/2021 04:03 PM   Modules accepted: Orders

## 2021-09-07 NOTE — Telephone Encounter (Signed)
Prior Auth for patients medication FIASP FLEXPEN denied by [place] (MEDICAID) via CoverMyMeds.   Reason: Per your health plan's criteria, this drug is covered if you meet the following: If the request is for non-preferred, you have tried or cannot use one preferred drug: Insulin Lispro U-100 Junior KwikPen (generic for Humalog Junior), Insulin Lispro U-100 KwikPen/vial (generic for Humalog), Humalog U-100 Junior KwikPen, Humalog U-100 KwikPen/Vial, Novolog U-100 Cartridge/FlexPen/Vial.  CoverMyMeds Key: [id number]  Pt should have been notified via text from covermymeds/medicaid.

## 2021-09-08 ENCOUNTER — Encounter (HOSPITAL_COMMUNITY): Payer: Self-pay | Admitting: Emergency Medicine

## 2021-09-08 ENCOUNTER — Emergency Department (HOSPITAL_COMMUNITY)
Admission: EM | Admit: 2021-09-08 | Discharge: 2021-09-08 | Discharge status: Against Medical Advice | Payer: Medicaid Other | Attending: Emergency Medicine | Admitting: Emergency Medicine

## 2021-09-08 ENCOUNTER — Other Ambulatory Visit: Payer: Self-pay

## 2021-09-08 DIAGNOSIS — R109 Unspecified abdominal pain: Secondary | ICD-10-CM | POA: Diagnosis not present

## 2021-09-08 DIAGNOSIS — Z794 Long term (current) use of insulin: Secondary | ICD-10-CM | POA: Insufficient documentation

## 2021-09-08 DIAGNOSIS — H538 Other visual disturbances: Secondary | ICD-10-CM | POA: Diagnosis not present

## 2021-09-08 DIAGNOSIS — Z5321 Procedure and treatment not carried out due to patient leaving prior to being seen by health care provider: Secondary | ICD-10-CM | POA: Diagnosis not present

## 2021-09-08 DIAGNOSIS — R1110 Vomiting, unspecified: Secondary | ICD-10-CM | POA: Insufficient documentation

## 2021-09-08 DIAGNOSIS — E119 Type 2 diabetes mellitus without complications: Secondary | ICD-10-CM | POA: Insufficient documentation

## 2021-09-08 DIAGNOSIS — R112 Nausea with vomiting, unspecified: Secondary | ICD-10-CM | POA: Diagnosis not present

## 2021-09-08 LAB — CBC WITH DIFFERENTIAL/PLATELET
Abs Immature Granulocytes: 0.04 10*3/uL (ref 0.00–0.07)
Basophils Absolute: 0 10*3/uL (ref 0.0–0.1)
Basophils Relative: 0 %
Eosinophils Absolute: 0 10*3/uL (ref 0.0–0.5)
Eosinophils Relative: 0 %
HCT: 45.9 % (ref 36.0–46.0)
Hemoglobin: 16.1 g/dL — ABNORMAL HIGH (ref 12.0–15.0)
Immature Granulocytes: 0 %
Lymphocytes Relative: 21 %
Lymphs Abs: 1.9 10*3/uL (ref 0.7–4.0)
MCH: 28.9 pg (ref 26.0–34.0)
MCHC: 35.1 g/dL (ref 30.0–36.0)
MCV: 82.4 fL (ref 80.0–100.0)
Monocytes Absolute: 0.4 10*3/uL (ref 0.1–1.0)
Monocytes Relative: 4 %
Neutro Abs: 6.9 10*3/uL (ref 1.7–7.7)
Neutrophils Relative %: 75 %
Platelets: 381 10*3/uL (ref 150–400)
RBC: 5.57 MIL/uL — ABNORMAL HIGH (ref 3.87–5.11)
RDW: 13.7 % (ref 11.5–15.5)
WBC: 9.3 10*3/uL (ref 4.0–10.5)
nRBC: 0 % (ref 0.0–0.2)

## 2021-09-08 LAB — COMPREHENSIVE METABOLIC PANEL
ALT: 42 U/L (ref 0–44)
AST: 36 U/L (ref 15–41)
Albumin: 4.8 g/dL (ref 3.5–5.0)
Alkaline Phosphatase: 98 U/L (ref 38–126)
Anion gap: 16 — ABNORMAL HIGH (ref 5–15)
BUN: 20 mg/dL (ref 6–20)
CO2: 20 mmol/L — ABNORMAL LOW (ref 22–32)
Calcium: 9.8 mg/dL (ref 8.9–10.3)
Chloride: 97 mmol/L — ABNORMAL LOW (ref 98–111)
Creatinine, Ser: 0.65 mg/dL (ref 0.44–1.00)
GFR, Estimated: >60 mL/min (ref >60)
Glucose, Bld: 203 mg/dL — ABNORMAL HIGH (ref 70–99)
Potassium: 3.2 mmol/L — ABNORMAL LOW (ref 3.5–5.1)
Sodium: 133 mmol/L — ABNORMAL LOW (ref 135–145)
Total Bilirubin: 0.9 mg/dL (ref 0.3–1.2)
Total Protein: 8.8 g/dL — ABNORMAL HIGH (ref 6.5–8.1)

## 2021-09-08 LAB — BLOOD GAS, VENOUS
Acid-Base Excess: 3.8 mmol/L — ABNORMAL HIGH (ref 0.0–2.0)
Bicarbonate: 25.7 mmol/L (ref 20.0–28.0)
O2 Saturation: 66.8 %
Patient temperature: 37
pCO2, Ven: 30 mmHg — ABNORMAL LOW (ref 44–60)
pH, Ven: 7.54 — ABNORMAL HIGH (ref 7.25–7.43)
pO2, Ven: 38 mmHg (ref 32–45)

## 2021-09-08 LAB — CBG MONITORING, ED: Glucose-Capillary: 168 mg/dL — ABNORMAL HIGH (ref 70–99)

## 2021-09-08 LAB — I-STAT BETA HCG BLOOD, ED (MC, WL, AP ONLY): I-stat hCG, quantitative: <5 m[IU]/mL (ref <5)

## 2021-09-08 LAB — LIPASE, BLOOD: Lipase: 22 U/L (ref 11–51)

## 2021-09-08 LAB — BETA-HYDROXYBUTYRIC ACID: Beta-Hydroxybutyric Acid: 4.27 mmol/L — ABNORMAL HIGH (ref 0.05–0.27)

## 2021-09-08 MED ORDER — SODIUM CHLORIDE 0.9 % IV BOLUS: 1000.0000 mL | Freq: Once | INTRAVENOUS | Status: DC

## 2021-09-08 NOTE — ED Triage Notes (Signed)
Pt reports vomiting, abd pain, blurry vision. Hx diabetes. Unable to check blood sugar at home due to insurance issues lately. Last time she took insulin was this morning.

## 2021-09-08 NOTE — ED Provider Triage Note (Signed)
Emergency Medicine Provider Triage Evaluation Note  [name] , a 26 y.o. female  was evaluated in triage.  Pt complains of nausea vomiting abdominal pain onset 2-3 days ago.  Patient reports history of type 1 diabetes and DKA.  Patient reports unable to tolerate p.o. since onset of symptoms.  She denies sick contacts.  Review of Systems  Positive: Abdominal pain, nausea/vomiting Negative: Fall, injury, fever, chills, chest pain/shortness of breath, diarrhea, dysuria/hematuria or any additional concerns.  Physical Exam  BP 122/88 (BP Location: Right Arm)    Pulse (!) 112    Temp 98.9 F (37.2 C) (Oral)    Resp 17    SpO2 100%  Gen:   Awake, uncomfortable. Resp:  Normal effort  MSK:   Moves extremities without difficulty    Medical Decision Making  Medically screening exam initiated at 1:35 PM.  Appropriate orders placed.  Sedra Dowlen was informed that the remainder of the evaluation will be completed by another provider, this initial triage assessment does not replace that evaluation, and the importance of remaining in the ED until their evaluation is complete.   Note: Portions of this report may have been transcribed using voice recognition software. Every effort was made to ensure accuracy; however, inadvertent computerized transcription errors may still be present.    Deliah Boston, PA-C 09/08/21 1339

## 2021-09-14 ENCOUNTER — Other Ambulatory Visit: Payer: Self-pay | Admitting: Family Medicine

## 2021-09-14 DIAGNOSIS — E108 Type 1 diabetes mellitus with unspecified complications: Secondary | ICD-10-CM

## 2021-09-15 ENCOUNTER — Ambulatory Visit: Payer: Medicaid Other | Admitting: Pharmacist

## 2021-09-21 ENCOUNTER — Encounter: Payer: Self-pay | Admitting: Family Medicine

## 2021-09-21 ENCOUNTER — Ambulatory Visit (INDEPENDENT_AMBULATORY_CARE_PROVIDER_SITE_OTHER): Payer: Medicaid Other | Admitting: Family Medicine

## 2021-09-21 ENCOUNTER — Other Ambulatory Visit: Payer: Self-pay

## 2021-09-21 VITALS — BP 96/60 | HR 82 | Ht 67.0 in | Wt 132.0 lb

## 2021-09-21 DIAGNOSIS — E108 Type 1 diabetes mellitus with unspecified complications: Secondary | ICD-10-CM | POA: Diagnosis present

## 2021-09-21 LAB — POCT GLYCOSYLATED HEMOGLOBIN (HGB A1C): HbA1c, POC (controlled diabetic range): 10.7 % — AB (ref 0.0–7.0)

## 2021-09-21 MED ORDER — DEXCOM G6 RECEIVER DEVI: 2 refills | Status: DC

## 2021-09-21 MED ORDER — DEXCOM G6 TRANSMITTER MISC: 11 refills | Status: DC

## 2021-09-21 MED ORDER — ACCU-CHEK AVIVA PLUS VI STRP: 1.0000 | ORAL_STRIP | 11 refills | Status: DC
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
## 2021-09-21 NOTE — Assessment & Plan Note (Signed)
Improved. A1c 10.6, down from 11.5. Better control and a few lows with Lantus 25 units daily and Humalog 8 units with meals. No CBGs above 300.  ?- keep Lantus at 25 units ?- increase Humalog from 8 to 10 units with meals ?- continue to add 2 units Humalog with evening snacks if at least 2 hours past evening meal ?- follow up one month ?- next A1c in 3 months ?

## 2021-09-21 NOTE — Patient Instructions (Addendum)
It was wonderful to see you today. Thank you for allowing me to be a part of your care. Below is a short summary of what we discussed at your visit today: ? ?Diabetes ?Your A1c today was 10.7, down from 11.5 6 months ago! Great work! ?Keep Lantus at 25 units per day.  ?Increase your mealtime Humalog from 8 units to 10 units with each meal eaten.  ?Still add 2 units Humalog with evening snacks if at least 2 hours past evening meal. ? ?If you are getting lows, decrease the Humalog from 10 units to 9 units and let me know! ? ?I have referred you to the ophthalmologist for your annual diabetic eye exam. Someone from their office should be calling you directly to make an appointment.  ? ?Dentists taking Medicaid:  ?[facility]?  ?[phone]?  ?[address], [place], [place] [address]  ? ?[facility] - only for dentures or partials ?  ?[phone]?  ? ?[facility]?  ?[phone]?  ? ?[facility] ?  ?[phone]?  ?[address], [place], [place] [address]   ? ?[place]:   ?For Pregnant Women and adults age 51-21 with medicaid:  ?[place]: [facility], [place], [place] [address].For appointments and more information, call [phone].  ?[place]: [address] [place], [place] [address]. For appointments and more information, call [phone].  ? ?Cooking and Nutrition Classes ?[employer] in [place] provides many classes at low or no cost to [employer], nutrition, and agriculture.  Their website offers a huge variety of information related to topics such as gardening, nutrition, cooking, parenting, and health.  Also listed are classes and events, both online and in-person.  Check out their website here: [email or web address]  ? ?Please bring all of your medications to every appointment! ? ?If you have any questions or concerns, please do not hesitate to  contact us via phone or MyChart message.  ? ?Ezequiel Essex, MD  ?

## 2021-09-22 ENCOUNTER — Other Ambulatory Visit (HOSPITAL_COMMUNITY): Payer: Self-pay

## 2021-10-11 ENCOUNTER — Other Ambulatory Visit (HOSPITAL_COMMUNITY): Payer: Self-pay

## 2021-10-11 ENCOUNTER — Telehealth: Payer: Self-pay

## 2021-10-11 NOTE — Telephone Encounter (Signed)
A Prior Authorization was initiated for this patients DEXCOM RECEIVER through CoverMyMeds.  ? ?Key: [id number] ? ?

## 2021-10-12 NOTE — Telephone Encounter (Signed)
Per medicaid: Product previously approved under "[id number]" -- covered until 09/23/22. Refill should be payable after 10/17/21. ?

## 2021-10-19 ENCOUNTER — Other Ambulatory Visit (HOSPITAL_COMMUNITY): Payer: Self-pay

## 2021-10-19 ENCOUNTER — Telehealth: Payer: Self-pay

## 2021-10-19 NOTE — Telephone Encounter (Signed)
A Prior Authorization was initiated for this patients DEXCOM G6 SENSOR through CoverMyMeds.  ? ?Key: [id number] ? ?

## 2021-10-20 NOTE — Telephone Encounter (Signed)
Prior Auth for patients medication DEXCOM G6 SENSOR approved by [employer] from 10/19/21 to 10/20/22.  ? ?Key: [id number] ? ?Patients pharmacy notified. ? ?

## 2021-10-21 ENCOUNTER — Ambulatory Visit: Payer: Medicaid Other | Admitting: Family Medicine

## 2021-10-24 ENCOUNTER — Telehealth: Payer: Self-pay

## 2021-10-24 NOTE — Telephone Encounter (Signed)
Patients sister calls nurse line reporting nausea vomiting in patient.  ? ?Sister reports vomiting started this morning and she has thrown up ~6 times. Sister reports her sugars have been "good" all weekend and this morning reading was 148. Sister reports she has not had any fluids or anything to eat.  ? ?Sister denies any fevers, SOB or confusion.  ? ?I advised sister to monitor her closely and to see if she can keep fluids down. If she can not she will need to go to the ED.  ? ? ? ? ?

## 2021-11-23 ENCOUNTER — Other Ambulatory Visit: Payer: Self-pay

## 2021-11-23 ENCOUNTER — Emergency Department (HOSPITAL_COMMUNITY)
Admission: EM | Admit: 2021-11-23 | Discharge: 2021-11-23 | Disposition: A | Discharge status: Home / Self Care | Payer: Medicaid Other | Attending: Emergency Medicine | Admitting: Emergency Medicine

## 2021-11-23 ENCOUNTER — Encounter (HOSPITAL_COMMUNITY): Payer: Self-pay | Admitting: Student

## 2021-11-23 ENCOUNTER — Telehealth: Payer: Self-pay

## 2021-11-23 ENCOUNTER — Emergency Department (HOSPITAL_COMMUNITY): Payer: Medicaid Other

## 2021-11-23 DIAGNOSIS — E1065 Type 1 diabetes mellitus with hyperglycemia: Secondary | ICD-10-CM | POA: Diagnosis not present

## 2021-11-23 DIAGNOSIS — Z794 Long term (current) use of insulin: Secondary | ICD-10-CM | POA: Diagnosis not present

## 2021-11-23 DIAGNOSIS — N83202 Unspecified ovarian cyst, left side: Secondary | ICD-10-CM | POA: Diagnosis not present

## 2021-11-23 DIAGNOSIS — R1084 Generalized abdominal pain: Secondary | ICD-10-CM | POA: Diagnosis not present

## 2021-11-23 DIAGNOSIS — R112 Nausea with vomiting, unspecified: Secondary | ICD-10-CM | POA: Diagnosis not present

## 2021-11-23 DIAGNOSIS — R109 Unspecified abdominal pain: Secondary | ICD-10-CM | POA: Diagnosis not present

## 2021-11-23 DIAGNOSIS — R197 Diarrhea, unspecified: Secondary | ICD-10-CM | POA: Diagnosis not present

## 2021-11-23 DIAGNOSIS — E876 Hypokalemia: Secondary | ICD-10-CM | POA: Diagnosis not present

## 2021-11-23 DIAGNOSIS — R739 Hyperglycemia, unspecified: Secondary | ICD-10-CM | POA: Diagnosis not present

## 2021-11-23 DIAGNOSIS — D72829 Elevated white blood cell count, unspecified: Secondary | ICD-10-CM | POA: Insufficient documentation

## 2021-11-23 DIAGNOSIS — E109 Type 1 diabetes mellitus without complications: Secondary | ICD-10-CM | POA: Diagnosis not present

## 2021-11-23 LAB — URINALYSIS, ROUTINE W REFLEX MICROSCOPIC
Bacteria, UA: NONE SEEN
Bilirubin Urine: NEGATIVE
Glucose, UA: >=500 mg/dL — AB
Hgb urine dipstick: NEGATIVE
Ketones, ur: 80 mg/dL — AB
Leukocytes,Ua: NEGATIVE
Nitrite: NEGATIVE
Protein, ur: NEGATIVE mg/dL
Specific Gravity, Urine: 1.028 (ref 1.005–1.030)
pH: 5 (ref 5.0–8.0)

## 2021-11-23 LAB — BASIC METABOLIC PANEL
Anion gap: 10 (ref 5–15)
BUN: 14 mg/dL (ref 6–20)
CO2: 22 mmol/L (ref 22–32)
Calcium: 8.6 mg/dL — ABNORMAL LOW (ref 8.9–10.3)
Chloride: 104 mmol/L (ref 98–111)
Creatinine, Ser: 0.74 mg/dL (ref 0.44–1.00)
GFR, Estimated: >60 mL/min (ref >60)
Glucose, Bld: 201 mg/dL — ABNORMAL HIGH (ref 70–99)
Potassium: 2.9 mmol/L — ABNORMAL LOW (ref 3.5–5.1)
Sodium: 136 mmol/L (ref 135–145)

## 2021-11-23 LAB — BLOOD GAS, VENOUS
Acid-base deficit: 1.8 mmol/L (ref 0.0–2.0)
Bicarbonate: 23.1 mmol/L (ref 20.0–28.0)
O2 Saturation: 40.3 %
Patient temperature: 37
pCO2, Ven: 39 mmHg — ABNORMAL LOW (ref 44–60)
pH, Ven: 7.38 (ref 7.25–7.43)
pO2, Ven: <31 mmHg — CL (ref 32–45)

## 2021-11-23 LAB — LIPASE, BLOOD: Lipase: 20 U/L (ref 11–51)

## 2021-11-23 LAB — CBC
HCT: 37.2 % (ref 36.0–46.0)
Hemoglobin: 12.9 g/dL (ref 12.0–15.0)
MCH: 30.1 pg (ref 26.0–34.0)
MCHC: 34.7 g/dL (ref 30.0–36.0)
MCV: 86.7 fL (ref 80.0–100.0)
Platelets: 380 10*3/uL (ref 150–400)
RBC: 4.29 MIL/uL (ref 3.87–5.11)
RDW: 14.8 % (ref 11.5–15.5)
WBC: 21.8 10*3/uL — ABNORMAL HIGH (ref 4.0–10.5)
nRBC: 0 % (ref 0.0–0.2)

## 2021-11-23 LAB — COMPREHENSIVE METABOLIC PANEL
ALT: 25 U/L (ref 0–44)
AST: 25 U/L (ref 15–41)
Albumin: 4.4 g/dL (ref 3.5–5.0)
Alkaline Phosphatase: 101 U/L (ref 38–126)
Anion gap: 19 — ABNORMAL HIGH (ref 5–15)
BUN: 15 mg/dL (ref 6–20)
CO2: 20 mmol/L — ABNORMAL LOW (ref 22–32)
Calcium: 9.7 mg/dL (ref 8.9–10.3)
Chloride: 95 mmol/L — ABNORMAL LOW (ref 98–111)
Creatinine, Ser: 0.96 mg/dL (ref 0.44–1.00)
GFR, Estimated: >60 mL/min (ref >60)
Glucose, Bld: 389 mg/dL — ABNORMAL HIGH (ref 70–99)
Potassium: 3.6 mmol/L (ref 3.5–5.1)
Sodium: 134 mmol/L — ABNORMAL LOW (ref 135–145)
Total Bilirubin: 1.4 mg/dL — ABNORMAL HIGH (ref 0.3–1.2)
Total Protein: 8.4 g/dL — ABNORMAL HIGH (ref 6.5–8.1)

## 2021-11-23 LAB — I-STAT BETA HCG BLOOD, ED (MC, WL, AP ONLY): I-stat hCG, quantitative: <5 m[IU]/mL (ref <5)

## 2021-11-23 LAB — CBG MONITORING, ED: Glucose-Capillary: 356 mg/dL — ABNORMAL HIGH (ref 70–99)

## 2021-11-23 MED ORDER — IOHEXOL 300 MG/ML  SOLN: 80.0000 mL | Freq: Once | INTRAMUSCULAR | Status: DC | PRN

## 2021-11-23 MED ORDER — METOCLOPRAMIDE HCL 10 MG PO TABS: 10.0000 mg | ORAL_TABLET | Freq: Three times a day (TID) | ORAL | 0 refills | Status: DC | PRN

## 2021-11-23 MED ORDER — POTASSIUM CHLORIDE CRYS ER 20 MEQ PO TBCR: 20.0000 meq | EXTENDED_RELEASE_TABLET | Freq: Every day | ORAL | 0 refills | Status: DC

## 2021-11-23 MED ORDER — SODIUM CHLORIDE 0.9 % IV BOLUS
1000.0000 mL | Freq: Once | INTRAVENOUS | Status: AC
Start: 2021-11-23 — End: 2021-11-23
  Administered 2021-11-23: 1000 mL via INTRAVENOUS

## 2021-11-23 MED ORDER — SODIUM CHLORIDE 0.9 % IV BOLUS
500.0000 mL | Freq: Once | INTRAVENOUS | Status: AC
  Administered 2021-11-23: 500 mL via INTRAVENOUS

## 2021-11-23 MED ORDER — ONDANSETRON 4 MG PO TBDP
4.0000 mg | ORAL_TABLET | Freq: Once | ORAL | Status: AC | PRN
  Administered 2021-11-23: 4 mg via ORAL
  Filled 2021-11-23: qty 1

## 2021-11-23 MED ORDER — INSULIN ASPART 100 UNIT/ML IJ SOLN
8.0000 [IU] | Freq: Once | INTRAMUSCULAR | Status: AC
  Administered 2021-11-23: 8 [IU] via SUBCUTANEOUS
  Filled 2021-11-23: qty 0.08

## 2021-11-23 MED ORDER — POTASSIUM CHLORIDE CRYS ER 20 MEQ PO TBCR
40.0000 meq | EXTENDED_RELEASE_TABLET | Freq: Once | ORAL | Status: AC
Start: 2021-11-23 — End: 2021-11-23
  Administered 2021-11-23: 40 meq via ORAL
  Filled 2021-11-23: qty 2

## 2021-11-23 MED ORDER — METOCLOPRAMIDE HCL 5 MG/ML IJ SOLN
10.0000 mg | Freq: Once | INTRAMUSCULAR | Status: AC
  Administered 2021-11-23: 10 mg via INTRAVENOUS
  Filled 2021-11-23: qty 2

## 2021-11-23 NOTE — ED Triage Notes (Addendum)
Patient BIB POV c/o N/V/D and abdominal pain 7/10 that started yesterday. Patient has taken some motrin and Aleve PM.  Patient had EMS out to her home before coming in and they gave her an injection of zofran which has helped the nausea.  LMP was around 11/05/21. Patient did noticed what looked like blood in her urine yesterday. ?

## 2021-11-23 NOTE — Telephone Encounter (Signed)
Patient calls nurse line reporting elevated cbgs and vomiting. ? ?Patient reports she has been vomiting since yesterday and unable to keep fluids down.  ? ?Patient reports home cbgs have ranged from ~170-370 since yesterday evening.  ? ?Patient advised to go to ED for evaluation.  ?

## 2021-11-23 NOTE — Discharge Instructions (Addendum)
Follow-up with your primary care doctor.  Please have your potassium levels rechecked in a few days.  Continue your insulin as previously prescribed.  Come back here if you are having worsening pain, vomiting, fever or other new concerning symptom. ?

## 2021-11-23 NOTE — ED Provider Notes (Signed)
?[facility] DEPT ?Provider Note ? ? ?CSN: [id number] ?Arrival date & time: 11/23/21  1347 ? ?  ? ?History ? ?Chief Complaint  ?Patient presents with  ? Abdominal Pain  ? Nausea  ? Emesis  ? Diarrhea  ? ? ?Sabrina Sutton is a 26 y.o. female.  Presented to ER due to concern for nausea vomiting and loose stools.  Patient states symptoms started yesterday.  Was having nausea and vomiting and some abdominal pain.  Pain was on the left side of her abdomen.  Today she says that the pain has improved but continues to have nausea.  Vomit is nonbloody.  She thinks yesterday she may have noted slight blood in her urine.  Not currently on menstrual cycle.  No pain with urination.  States that she did not take her insulin today. ? ?HPI ? ?  ? ?Home Medications ?Prior to Admission medications   ?Medication Sig Start Date End Date Taking? Authorizing Provider  ?insulin lispro (HUMALOG KWIKPEN) 100 UNIT/ML KwikPen This is being substituted for Fiasp. Inject 8 Units into the skin with breakfast, with lunch, and with evening meal. Add 2 units Humalog with evening snacks if at least 2 hours past evening meal. ?Patient taking differently: Inject 2-8 Units into the skin See admin instructions. This is being substituted for Fiasp. Inject 8 Units into the skin with breakfast, with lunch, and with evening meal. Add 2 units Humalog with evening snacks if at least 2 hours past evening meal. 09/07/21  Yes Ezequiel Essex, MD  ?LANTUS SOLOSTAR 100 UNIT/ML Solostar Pen ADMINISTER 25 UNITS UNDER THE SKIN DAILY ?Patient taking differently: Inject 25 Units into the skin daily. 08/30/21  Yes Ezequiel Essex, MD  ?metoCLOPramide (REGLAN) 10 MG tablet Take 1 tablet (10 mg total) by mouth every 8 (eight) hours as needed for nausea. 11/23/21  Yes Lucrezia Starch, MD  ?potassium chloride SA (KLOR-CON M) 20 MEQ tablet Take 1 tablet (20 mEq total) by mouth daily. 11/23/21  Yes Lucrezia Starch, MD  ?Accu-Chek FastClix Lancets  MISC Check sugar 10 x daily 02/22/19   Wilber Oliphant, MD  ?acetaminophen (TYLENOL) 500 MG tablet Take 500 mg by mouth every 6 (six) hours as needed for mild pain, fever or headache. ?Patient not taking: Reported on 11/23/2021    [provider]  ?Alcohol Swabs (ALCOHOL PADS) 70 % PADS Use to wipe skin prior to insulin injections twice daily 02/22/19   Wilber Oliphant, MD  ?Continuous Blood Gluc Receiver (DEXCOM G6 RECEIVER) DEVI USE AS DIRECTED 09/21/21   Ezequiel Essex, MD  ?Continuous Blood Gluc Sensor (DEXCOM G6 SENSOR) MISC Inject 1 applicator into the skin as directed. (change sensor every 10 days) 11/19/20   Wilber Oliphant, MD  ?Continuous Blood Gluc Transmit (DEXCOM G6 TRANSMITTER) MISC INJECT 1 DEVICE UNDER THE SKIN AS DIRECTED UP TO 8 TIMES WITH [facility] NEW SENSOR 09/21/21   Ezequiel Essex, MD  ?FLUoxetine (PROZAC) 10 MG capsule Take 1 capsule (10 mg total) by mouth daily. ?Patient not taking: Reported on 11/23/2021 04/07/21 04/07/22  Ileene Musa E, PA  ?glucose blood (ACCU-CHEK AVIVA PLUS) test strip 1 each by Other route See admin instructions. Use as instructed 09/21/21   Ezequiel Essex, MD  ?injection device for insulin (INPEN 100-PINK-NOVO) DEVI Use as directed with Novolog cartridges. ?Patient not taking: Reported on 04/14/2021 09/30/20   Patriciaann Clan, DO  ?Insulin Pen Needle (B-D UF III MINI PEN NEEDLES) 31G X 5 MM MISC USE TO  CHECK BLOOD SUGAR IN THE MORNING BEFORE EATING, BEFORE EACH MEAL, AND AS NEEDED 04/01/21   Ezequiel Essex, MD  ?Lancets (ACCU-CHEK SOFT [facility]) lancets Use as directed. 10/24/17   Kathrene Alu, MD  ?Norgestimate-Ethinyl Estradiol Triphasic (ORTHO TRI-CYCLEN LO) 0.18/0.215/0.25 MG-25 MCG tab Take 1 tablet by mouth daily. ?Patient not taking: Reported on 04/14/2021 11/12/20   Wilber Oliphant, MD  ?ondansetron (ZOFRAN-ODT) 8 MG disintegrating tablet DISSOLVE 1 TABLET(8 MG) ON THE TONGUE EVERY 8 HOURS AS NEEDED FOR NAUSEA OR VOMITING ?Patient not taking: Reported on 04/14/2021 11/19/20    Lucrezia Starch, MD  ?traZODone (DESYREL) 50 MG tablet Take 1 tablet (50 mg total) by mouth at bedtime. ?Patient not taking: Reported on 11/23/2021 04/07/21   Malachy Mood, PA  ?   ? ?Allergies    ?Patient has no known allergies.   ? ?Review of Systems   ?Review of Systems  ?Constitutional:  Positive for fatigue. Negative for chills and fever.  ?HENT:  Negative for ear pain and sore throat.   ?Eyes:  Negative for pain and visual disturbance.  ?Respiratory:  Negative for cough and shortness of breath.   ?Cardiovascular:  Negative for chest pain and palpitations.  ?Gastrointestinal:  Positive for abdominal pain, nausea and vomiting.  ?Genitourinary:  Negative for dysuria and hematuria.  ?Musculoskeletal:  Negative for arthralgias and back pain.  ?Skin:  Negative for color change and rash.  ?Neurological:  Negative for seizures and syncope.  ?All other systems reviewed and are negative. ? ?Physical Exam ?Updated Vital Signs ?BP 112/72   Pulse 71   Temp 98.8 ?F (37.1 ?C) (Oral)   Resp 20   SpO2 100%  ?Physical Exam ?Vitals and nursing note reviewed.  ?Constitutional:   ?   General: She is not in acute distress. ?   Appearance: She is well-developed.  ?HENT:  ?   Head: Normocephalic and atraumatic.  ?Eyes:  ?   Conjunctiva/sclera: Conjunctivae normal.  ?Cardiovascular:  ?   Rate and Rhythm: Normal rate and regular rhythm.  ?   Heart sounds: No murmur heard. ?Pulmonary:  ?   Effort: Pulmonary effort is normal. No respiratory distress.  ?   Breath sounds: Normal breath sounds.  ?Abdominal:  ?   Palpations: Abdomen is soft.  ?   Tenderness: There is abdominal tenderness.  ?   Comments: Mild tenderness to the left side, no rebound or guarding  ?Musculoskeletal:     ?   General: No swelling.  ?   Cervical back: Neck supple.  ?Skin: ?   General: Skin is warm and dry.  ?   Capillary Refill: Capillary refill takes less than 2 seconds.  ?Neurological:  ?   Mental Status: She is alert.  ?Psychiatric:     ?   Mood and  Affect: Mood normal.  ? ? ?ED Results / Procedures / Treatments   ?Labs ?(all labs ordered are listed, but only abnormal results are displayed) ?Labs Reviewed  ?COMPREHENSIVE METABOLIC PANEL - Abnormal; Notable for the following components:  ?    Result Value  ? Sodium 134 (*)   ? Chloride 95 (*)   ? CO2 20 (*)   ? Glucose, Bld 389 (*)   ? Total Protein 8.4 (*)   ? Total Bilirubin 1.4 (*)   ? Anion gap 19 (*)   ? All other components within normal limits  ?CBC - Abnormal; Notable for the following components:  ? WBC 21.8 (*)   ? All  other components within normal limits  ?URINALYSIS, ROUTINE W REFLEX MICROSCOPIC - Abnormal; Notable for the following components:  ? Color, Urine STRAW (*)   ? Glucose, UA >=500 (*)   ? Ketones, ur 80 (*)   ? All other components within normal limits  ?BLOOD GAS, VENOUS - Abnormal; Notable for the following components:  ? pCO2, Ven 39 (*)   ? pO2, Ven <31 (*)   ? All other components within normal limits  ?BASIC METABOLIC PANEL - Abnormal; Notable for the following components:  ? Potassium 2.9 (*)   ? Glucose, Bld 201 (*)   ? Calcium 8.6 (*)   ? All other components within normal limits  ?CBG MONITORING, ED - Abnormal; Notable for the following components:  ? Glucose-Capillary 356 (*)   ? All other components within normal limits  ?LIPASE, BLOOD  ?I-STAT BETA HCG BLOOD, ED ([facility], [facility], [facility] ONLY)  ? ? ?EKG ?None ? ?Radiology ?CT ABDOMEN PELVIS WO CONTRAST ? ?Result Date: 11/23/2021 ?CLINICAL DATA:  Left lower quadrant abdominal pain. EXAM: CT ABDOMEN AND PELVIS WITHOUT CONTRAST TECHNIQUE: Multidetector CT imaging of the abdomen and pelvis was performed following the standard protocol without IV contrast. RADIATION DOSE REDUCTION: This exam was performed according to the departmental dose-optimization program which includes automated exposure control, adjustment of the mA and/or kV according to patient size and/or use of iterative reconstruction technique. COMPARISON:  September 23, 2018. FINDINGS:  Lower chest: No acute abnormality. Hepatobiliary: No focal liver abnormality is seen. No gallstones, gallbladder wall thickening, or biliary dilatation. Pancreas: Unremarkable. No pancreatic ductal dilatation or

## 2021-11-23 NOTE — ED Notes (Signed)
Patient hard stick. Attempted x1. IV team consulted. ?

## 2021-11-24 ENCOUNTER — Ambulatory Visit (INDEPENDENT_AMBULATORY_CARE_PROVIDER_SITE_OTHER): Payer: Medicaid Other | Admitting: Family Medicine

## 2021-11-24 DIAGNOSIS — R112 Nausea with vomiting, unspecified: Secondary | ICD-10-CM

## 2021-11-24 NOTE — Patient Instructions (Signed)
It was wonderful to see you today. ? ?Please bring ALL of your medications with you to every visit.  ? ?Today we talked about: ? ?** ? ? ?Thank you for choosing [facility].  ? ?Please call [phone] with any questions about today's appointment. ? ?Please be sure to schedule follow up at the front  desk before you leave today.  ? ?Ludene Stokke, DO ?PGY-2 Family Medicine   ?

## 2021-11-24 NOTE — Progress Notes (Signed)
[facility] ?Telemedicine Visit ? ?Patient consented to have virtual visit and was identified by name and date of birth. ?Method of visit: Telephone ? ?Encounter participants: ?Patient: Sabrina Sutton - located at Home ?Provider: Sharion Settler - located at [facility] clinic  ? ?Chief Complaint: Nausea, vomiting ? ?HPI: ? ?Reports that she overslept and that is why she was unable to make appointment today.  She continues to feel nauseous and has vomited about 6 times today.  Since waking up from her nap, she has been able to tolerate some ginger ale without any more vomiting.  She did take her insulin today and reports that after taking it her blood sugar was in the 100s. She does have history of cannabis associated hyperemesis but last time she smoked was 1 week ago. Has not yet picked up her medications that were prescribed from the emergency department (Reglan, potassium supplementation) ? ?ROS: per HPI ? ?Pertinent PMHx: T1DM, marijuana use ? ?Exam:  ?Respiratory: Normal respiratory effort, talking without difficulties  ? ?Assessment/Plan: ?1. Nausea and vomiting, unspecified vomiting type ?Reviewed ED visit yesterday.  Patient had an initial elevated glucose and elevated anion gap that resolved after insulin administration.  pH at that time was normal.  Also received CT abdomen pelvis which showed ovarian cyst, otherwise without any acute findings.  Patient does have a history of cannabis associated hyperemesis, this may be contributing as well. Discussed importance of cessation from marijuana. Encouraged patient to pick up her medications from her pharmacy take her potassium supplementation as it is likely to be low given her vomiting and insulin use. Recommend that she continue to check her sugars as well. Low suspicion for DKA now that sugars seem to be better controlled. Patient reports she will schedule an appointment tomorrow if symptoms not improved. Strict return precautions  given. ? ?Time spent during visit with patient: 6 minutes ? ?

## 2021-11-25 ENCOUNTER — Encounter (HOSPITAL_BASED_OUTPATIENT_CLINIC_OR_DEPARTMENT_OTHER): Payer: Self-pay

## 2021-11-25 ENCOUNTER — Other Ambulatory Visit: Payer: Self-pay

## 2021-11-25 ENCOUNTER — Emergency Department (HOSPITAL_BASED_OUTPATIENT_CLINIC_OR_DEPARTMENT_OTHER)
Admission: EM | Admit: 2021-11-25 | Discharge: 2021-11-25 | Discharge status: Against Medical Advice | Payer: Medicaid Other | Attending: Emergency Medicine | Admitting: Emergency Medicine

## 2021-11-25 DIAGNOSIS — R112 Nausea with vomiting, unspecified: Secondary | ICD-10-CM | POA: Insufficient documentation

## 2021-11-25 DIAGNOSIS — Z794 Long term (current) use of insulin: Secondary | ICD-10-CM | POA: Insufficient documentation

## 2021-11-25 DIAGNOSIS — N83209 Unspecified ovarian cyst, unspecified side: Secondary | ICD-10-CM | POA: Diagnosis not present

## 2021-11-25 DIAGNOSIS — R109 Unspecified abdominal pain: Secondary | ICD-10-CM | POA: Diagnosis not present

## 2021-11-25 DIAGNOSIS — E876 Hypokalemia: Secondary | ICD-10-CM | POA: Diagnosis not present

## 2021-11-25 DIAGNOSIS — R1032 Left lower quadrant pain: Secondary | ICD-10-CM | POA: Diagnosis not present

## 2021-11-25 LAB — CBC
HCT: 37.1 % (ref 36.0–46.0)
Hemoglobin: 12.6 g/dL (ref 12.0–15.0)
MCH: 28.6 pg (ref 26.0–34.0)
MCHC: 34 g/dL (ref 30.0–36.0)
MCV: 84.1 fL (ref 80.0–100.0)
Platelets: 326 10*3/uL (ref 150–400)
RBC: 4.41 MIL/uL (ref 3.87–5.11)
RDW: 13.9 % (ref 11.5–15.5)
WBC: 14.4 10*3/uL — ABNORMAL HIGH (ref 4.0–10.5)
nRBC: 0 % (ref 0.0–0.2)

## 2021-11-25 NOTE — Discharge Instructions (Signed)
Your test results were not done before he left this evening.  They will be available in MyChart.  Follow-up with your doctor as planned.  Return at any time if you have concerning symptoms ?

## 2021-11-25 NOTE — ED Provider Notes (Signed)
?[facility] DEPT ?Provider Note ? ? ?CSN: [id number] ?Arrival date & time: 11/25/21  1915 ? ?  ? ?History ? ?Chief Complaint  ?Patient presents with  ? Flank Pain  ? ? ?Sabrina Sutton is a 26 y.o. female. ? ? ?Flank Pain ? ? ?Patient presents to the ER for evaluation of some persistent pain in her left flank area and persistent nausea vomiting.  Patient states she still had a few episodes of nausea vomiting per day.  She is taking Zofran that is helping somewhat.  Patient states she still have some pain in the left flank area.  She was seen in the emergency room few days ago.  She was told to return to the ED if her symptoms persisted.  She is not having any fever.  No dysuria. ? ?Home Medications ?Prior to Admission medications   ?Medication Sig Start Date End Date Taking? Authorizing Provider  ?Accu-Chek FastClix Lancets MISC Check sugar 10 x daily 02/22/19   Wilber Oliphant, MD  ?acetaminophen (TYLENOL) 500 MG tablet Take 500 mg by mouth every 6 (six) hours as needed for mild pain, fever or headache. ?Patient not taking: Reported on 11/23/2021    [provider]  ?Alcohol Swabs (ALCOHOL PADS) 70 % PADS Use to wipe skin prior to insulin injections twice daily 02/22/19   Wilber Oliphant, MD  ?Continuous Blood Gluc Receiver (DEXCOM G6 RECEIVER) DEVI USE AS DIRECTED 09/21/21   Ezequiel Essex, MD  ?Continuous Blood Gluc Sensor (DEXCOM G6 SENSOR) MISC Inject 1 applicator into the skin as directed. (change sensor every 10 days) 11/19/20   Wilber Oliphant, MD  ?Continuous Blood Gluc Transmit (DEXCOM G6 TRANSMITTER) MISC INJECT 1 DEVICE UNDER THE SKIN AS DIRECTED UP TO 8 TIMES WITH [facility] NEW SENSOR 09/21/21   Ezequiel Essex, MD  ?FLUoxetine (PROZAC) 10 MG capsule Take 1 capsule (10 mg total) by mouth daily. ?Patient not taking: Reported on 11/23/2021 04/07/21 04/07/22  Ileene Musa E, PA  ?glucose blood (ACCU-CHEK AVIVA PLUS) test strip 1 each by Other route See admin instructions. Use as instructed  09/21/21   Ezequiel Essex, MD  ?injection device for insulin (INPEN 100-PINK-NOVO) DEVI Use as directed with Novolog cartridges. ?Patient not taking: Reported on 04/14/2021 09/30/20   Patriciaann Clan, DO  ?insulin lispro (HUMALOG KWIKPEN) 100 UNIT/ML KwikPen This is being substituted for Fiasp. Inject 8 Units into the skin with breakfast, with lunch, and with evening meal. Add 2 units Humalog with evening snacks if at least 2 hours past evening meal. ?Patient taking differently: Inject 2-8 Units into the skin See admin instructions. This is being substituted for Fiasp. Inject 8 Units into the skin with breakfast, with lunch, and with evening meal. Add 2 units Humalog with evening snacks if at least 2 hours past evening meal. 09/07/21   Ezequiel Essex, MD  ?Insulin Pen Needle (B-D UF III MINI PEN NEEDLES) 31G X 5 MM MISC USE TO CHECK BLOOD SUGAR IN THE MORNING BEFORE EATING, BEFORE EACH MEAL, AND AS NEEDED 04/01/21   Ezequiel Essex, MD  ?Lancets (ACCU-CHEK SOFT [facility]) lancets Use as directed. 10/24/17   Kathrene Alu, MD  ?LANTUS SOLOSTAR 100 UNIT/ML Solostar Pen ADMINISTER 25 UNITS UNDER THE SKIN DAILY ?Patient taking differently: Inject 25 Units into the skin daily. 08/30/21   Ezequiel Essex, MD  ?metoCLOPramide (REGLAN) 10 MG tablet Take 1 tablet (10 mg total) by mouth every 8 (eight) hours as needed for nausea. 11/23/21   Lucrezia Starch,  MD  ?Norgestimate-Ethinyl Estradiol Triphasic (ORTHO TRI-CYCLEN LO) 0.18/0.215/0.25 MG-25 MCG tab Take 1 tablet by mouth daily. ?Patient not taking: Reported on 04/14/2021 11/12/20   Wilber Oliphant, MD  ?ondansetron (ZOFRAN-ODT) 8 MG disintegrating tablet DISSOLVE 1 TABLET(8 MG) ON THE TONGUE EVERY 8 HOURS AS NEEDED FOR NAUSEA OR VOMITING ?Patient not taking: Reported on 04/14/2021 11/19/20   Lucrezia Starch, MD  ?potassium chloride SA (KLOR-CON M) 20 MEQ tablet Take 1 tablet (20 mEq total) by mouth daily. 11/23/21   Lucrezia Starch, MD  ?traZODone (DESYREL) 50 MG tablet Take 1  tablet (50 mg total) by mouth at bedtime. ?Patient not taking: Reported on 11/23/2021 04/07/21   Malachy Mood, PA  ?   ? ?Allergies    ?Patient has no known allergies.   ? ?Review of Systems   ?Review of Systems  ?Genitourinary:  Positive for flank pain.  ? ?Physical Exam ?Updated Vital Signs ?BP 119/75   Pulse 82   Temp 99.5 ?F (37.5 ?C)   Resp 18   Ht 1.702 m ('5\' 7"'$ )   Wt 59.9 kg   SpO2 100%   BMI 20.68 kg/m?  ?Physical Exam ?Vitals and nursing note reviewed.  ?Constitutional:   ?   General: She is not in acute distress. ?   Appearance: She is well-developed.  ?HENT:  ?   Head: Normocephalic and atraumatic.  ?   Right Ear: External ear normal.  ?   Left Ear: External ear normal.  ?Eyes:  ?   General: No scleral icterus.    ?   Right eye: No discharge.     ?   Left eye: No discharge.  ?   Conjunctiva/sclera: Conjunctivae normal.  ?Neck:  ?   Trachea: No tracheal deviation.  ?Cardiovascular:  ?   Rate and Rhythm: Normal rate and regular rhythm.  ?Pulmonary:  ?   Effort: Pulmonary effort is normal. No respiratory distress.  ?   Breath sounds: Normal breath sounds. No stridor. No wheezing or rales.  ?Abdominal:  ?   General: Bowel sounds are normal. There is no distension.  ?   Palpations: Abdomen is soft.  ?   Tenderness: There is no abdominal tenderness. There is no guarding or rebound.  ?Musculoskeletal:     ?   General: No tenderness or deformity.  ?   Cervical back: Neck supple.  ?Skin: ?   General: Skin is warm and dry.  ?   Findings: No rash.  ?Neurological:  ?   General: No focal deficit present.  ?   Mental Status: She is alert.  ?   Cranial Nerves: No cranial nerve deficit (no facial droop, extraocular movements intact, no slurred speech).  ?   Sensory: No sensory deficit.  ?   Motor: No abnormal muscle tone or seizure activity.  ?   Coordination: Coordination normal.  ?Psychiatric:     ?   Mood and Affect: Mood normal.  ? ? ?ED Results / Procedures / Treatments   ?Labs ?(all labs ordered are listed,  but only abnormal results are displayed) ?Labs Reviewed  ?CBC - Abnormal; Notable for the following components:  ?    Result Value  ? WBC 14.4 (*)   ? All other components within normal limits  ?COMPREHENSIVE METABOLIC PANEL  ?LIPASE, BLOOD  ? ? ?EKG ?None ? ?Radiology ?No results found. ? ?Procedures ?Procedures  ? ? ?Medications Ordered in ED ?Medications - No data to display ? ?ED Course/ Medical Decision  Making/ A&P ?Clinical Course as of 11/25/21 2351  ?Fri Nov 25, 2021  ?2350 Patient's laboratory test are still in process.  She informed the nurse that she wants to go home and does not want to wait any longer.  She is feeling well enough and can follow-up with her doctor on Monday. [JK]  ?  ?Clinical Course User Index ?[JK] Dorie Rank, MD  ? ?                        ?Medical Decision Making ?Amount and/or Complexity of Data Reviewed ?Labs: ordered. ? ?ED work-up reviewed from the visit May 3.  Patient had a urinalysis that did not show signs of infection.  She had a normal venous blood gas.  She did have hypokalemia.  She had a CT scan of her abdomen pelvis that showed a 4.3 cm ovarian cyst.  No follow-up imaging was recommended ? ? ?Patient's vital signs are reassuring.  No fever.  No tachycardia.  She is not having any nausea or vomiting in the ED.  I plan on rechecking her laboratory tests including lipase and metabolic panel.  Labs were still pending at the time of discharge as the patient decided to leave before all of her test results were complete.  Patient states she will check her results on MyChart and can return if there is any concerning findings ? ? ? ? ? ? ? ?Final Clinical Impression(s) / ED Diagnoses ?Final diagnoses:  ?Abdominal pain, unspecified abdominal location  ? ? ?Rx / DC Orders ?ED Discharge Orders   ? ? None  ? ?  ? ? ?  ?Dorie Rank, MD ?11/25/21 2351 ? ?

## 2021-11-25 NOTE — ED Triage Notes (Signed)
Patient here POV from Home with Left Flank Pain. ? ?Seen in ED Wednesday for N/V and left Sided Flank Pain that began on Tuesday. Instructed to follow up if pain worsened. Also endorses continued N/V.  ? ?No Diarrhea. No Fevers. No Discernable Urinary Symptoms.  ? ?NAD Noted during Triage. A&Ox4. GCS 15. Ambulatory. ?

## 2021-11-26 LAB — COMPREHENSIVE METABOLIC PANEL
ALT: 18 U/L (ref 0–44)
AST: 11 U/L — ABNORMAL LOW (ref 15–41)
Albumin: 4 g/dL (ref 3.5–5.0)
Alkaline Phosphatase: 74 U/L (ref 38–126)
Anion gap: 15 (ref 5–15)
BUN: 8 mg/dL (ref 6–20)
CO2: 22 mmol/L (ref 22–32)
Calcium: 9.4 mg/dL (ref 8.9–10.3)
Chloride: 99 mmol/L (ref 98–111)
Creatinine, Ser: 0.66 mg/dL (ref 0.44–1.00)
GFR, Estimated: >60 mL/min (ref >60)
Glucose, Bld: 57 mg/dL — ABNORMAL LOW (ref 70–99)
Potassium: 3.2 mmol/L — ABNORMAL LOW (ref 3.5–5.1)
Sodium: 136 mmol/L (ref 135–145)
Total Bilirubin: 0.7 mg/dL (ref 0.3–1.2)
Total Protein: 6.9 g/dL (ref 6.5–8.1)

## 2021-11-26 LAB — LIPASE, BLOOD: Lipase: 13 U/L (ref 11–51)

## 2021-11-28 ENCOUNTER — Ambulatory Visit (INDEPENDENT_AMBULATORY_CARE_PROVIDER_SITE_OTHER): Payer: Medicaid Other | Admitting: Family Medicine

## 2021-11-28 VITALS — BP 109/88 | HR 112 | Wt 117.6 lb

## 2021-11-28 DIAGNOSIS — R1084 Generalized abdominal pain: Secondary | ICD-10-CM

## 2021-11-28 DIAGNOSIS — R109 Unspecified abdominal pain: Secondary | ICD-10-CM | POA: Insufficient documentation

## 2021-11-28 DIAGNOSIS — E876 Hypokalemia: Secondary | ICD-10-CM

## 2021-11-28 NOTE — Assessment & Plan Note (Addendum)
-  etiology likely secondary to MSK pain from GI losses, patient has had 4 straight days of emesis with 6-7 episodes each day. Consideration also given to appendicitis and pyelonephritis but low concern given symptoms and exam. Lipase wnl making pancreatitis less likely. Patient is not in DKA and prior anion gap along with electrolyte abnormalities likely secondary to GI loss. GU pathology also considered but ovarian cyst noted on recent imaging less likely to explain cause with no follow up recommended at this time. May consider repeat imaging if experiencing lower quadrant pain with unresolving symptoms. Monitor for pelvic pain in the future. May also benefit from future STI testing.  ?-safe sex practices counseling ?-heating pad along with tylenol or ibuprofen for the pain as appropriate ?-advance diet gradually as tolerated ?-strongly encouraged cannabis cessation  ?-may consider gastroenteritis diagnosis given DM history, consider Reglan if appropriate  ?-instructed to follow up with Dr. Valentina Lucks at earliest convenience and Dr. Jeani Hawking in 3-4 for repeat A1c and DM management, stressed the importance of controlling her DM, patient agrees.  ?

## 2021-11-28 NOTE — Progress Notes (Signed)
? ? ?  SUBJECTIVE:  ? ?CHIEF COMPLAINT / HPI:  ? ?Patient presents for ED follow up, started to experience abdominal pain, nausea and vomiting that started 5/2. Has had multiple ED visits, labs overall looked okay. Imaging notable for 4.3 cm left ovarian cyst without follow up imaging recommended but otherwise no significant abnormality noted within the abdomen and pelvis. Today she reports that she has been doing much better, she has not had nausea and vomiting since 5/5 but still endorsing the abdominal pain. Pain starts on her left side and moves to the right side. Describes pain as sharp pain that is intermittent. Aggravating factors include certain movements and positions (being on her left side). Relieving factors including tylenol and ibuprofen which helps but then the pain returns. Warm bath helps the pain. She is able to eat, her appetite has improved since symptoms started. Has a BM at least once daily which does not seem the affect the pain. Denies fever, chills, dyspnea, chest pain, confusion or mental status changes, dysuria and hematuria. Uses marijuana, does not use as often as she used to but has been using since 2017. Does not drink alcohol. Sexually active and uses protection, denies new partners.  ? ? ?OBJECTIVE:  ? ?BP 109/88   Pulse (!) 112   Wt 117 lb 9.6 oz (53.3 kg)   LMP 11/28/2021 (Exact Date)   SpO2 100%   BMI 18.42 kg/m?   ?General: Patient well-appearing and smiling, in no acute distress. ?CV: RRR, no murmurs or gallops auscultated ?Resp: CTAB, no wheezing, rales or rhonchi noted ?Abdomen: soft, nontender, nondistended, presence of bowel sounds, no evidence of organomegaly, negative McBurney's ?MSK: negative CVA tenderness bilaterally ?Derm: no pallor noted  ?Psych: mood appropriate  ? ?ASSESSMENT/PLAN:  ? ?Abdominal pain ?-etiology likely secondary to MSK pain from GI losses, patient has had 4 straight days of emesis with 6-7 episodes each day. Consideration also given to  appendicitis and pyelonephritis but low concern given symptoms and exam. Lipase wnl making pancreatitis less likely. Patient is not in DKA and prior anion gap along with electrolyte abnormalities likely secondary to GI loss. GU pathology also considered but ovarian cyst noted on recent imaging less likely to explain cause with no follow up recommended at this time. May consider repeat imaging if experiencing lower quadrant pain with unresolving symptoms. Monitor for pelvic pain in the future. May also benefit from future STI testing.  ?-safe sex practices counseling ?-heating pad along with tylenol or ibuprofen for the pain as appropriate ?-advance diet gradually as tolerated ?-strongly encouraged cannabis cessation  ?-may consider gastroenteritis diagnosis given DM history, consider Reglan if appropriate  ?-instructed to follow up with Dr. Valentina Lucks at earliest convenience and Dr. Jeani Hawking in 3-4 for repeat A1c and DM management, stressed the importance of controlling her DM, patient agrees.  ? ?Hypokalemia ?-K 3.2 a few days ago, given K supplementation. Hypokalemia likely secondary to GI losses, will obtain repeat CMP to ensure resolution. Patient does not seem to be in DKA and is not on chronic diuresis.  ? ? ?Donney Dice, DO ?[place]  ?

## 2021-11-28 NOTE — Assessment & Plan Note (Signed)
-  K 3.2 a few days ago, given K supplementation. Hypokalemia likely secondary to GI losses, will obtain repeat CMP to ensure resolution. Patient does not seem to be in DKA and is not on chronic diuresis.  ?

## 2021-11-28 NOTE — Patient Instructions (Signed)
It was great seeing you today! ? ?Today we discussed your abdominal pain, I am glad that you are feeling better. I think there is some soreness due to all the vomiting you experienced last week. I think this is also why your potassium was low, we will check it again today to make sure it is back to normal. You may use a heating pad and take tylenol or ibuprofen for the pain, this should eventually resolve soon.  ? ?I strongly recommend to stop using marijuana as this will help your symptoms. ? ?I want you to follow up with both Dr. Valentina Lucks and Dr. Jeani Hawking for your diabetes as worsening diabetes can also cause some of your symptoms.  ? ?The imaging showed a small left ovarian cyst which does not need further follow up.  ? ?Please follow up at your next scheduled appointment 3-4 weeks, if anything arises between now and then, please don't hesitate to contact our office. ? ? ?Thank you for allowing [place] to be a part of your medical care! ? ?Thank you, ?Dr. Larae Grooms  ?

## 2021-11-29 LAB — COMPREHENSIVE METABOLIC PANEL
ALT: 9 IU/L (ref 0–32)
AST: 10 IU/L (ref 0–40)
Albumin/Globulin Ratio: 1.3 (ref 1.2–2.2)
Albumin: 4 g/dL (ref 3.9–5.0)
Alkaline Phosphatase: 112 IU/L (ref 44–121)
BUN/Creatinine Ratio: 8 — ABNORMAL LOW (ref 9–23)
BUN: 6 mg/dL (ref 6–20)
Bilirubin Total: 0.3 mg/dL (ref 0.0–1.2)
CO2: 19 mmol/L — ABNORMAL LOW (ref 20–29)
Calcium: 9.3 mg/dL (ref 8.7–10.2)
Chloride: 97 mmol/L (ref 96–106)
Creatinine, Ser: 0.72 mg/dL (ref 0.57–1.00)
Globulin, Total: 3 g/dL (ref 1.5–4.5)
Glucose: 133 mg/dL — ABNORMAL HIGH (ref 70–99)
Potassium: 4 mmol/L (ref 3.5–5.2)
Sodium: 133 mmol/L — ABNORMAL LOW (ref 134–144)
Total Protein: 7 g/dL (ref 6.0–8.5)
eGFR: 120 mL/min/{1.73_m2} (ref >59)

## 2021-12-09 ENCOUNTER — Encounter: Payer: Self-pay | Admitting: Pharmacist

## 2021-12-09 ENCOUNTER — Ambulatory Visit: Payer: Medicaid Other | Admitting: Pharmacist

## 2021-12-27 ENCOUNTER — Encounter: Payer: Self-pay | Admitting: *Deleted

## 2021-12-29 ENCOUNTER — Encounter: Payer: Self-pay | Admitting: Family Medicine

## 2022-01-02 ENCOUNTER — Telehealth: Payer: Self-pay

## 2022-01-02 NOTE — Telephone Encounter (Signed)
Patient calls nurse line requesting to speak with PCP in regards to not feeling well.   Patient reports back pain that started today. Patient reports she did have episodes of vomiting over the weekend. Patient has not vomited in 24 hours. Patient reports her sugars have been "good" when asked. Most recent reading 104.  Patient denies any fevers, current nausea vomiting, dry mouth, headaches or fatigue.   Patient scheduled for 6/14 for evaluation.   Patient advised to monitor her sugars and strict precautions given in the meantime.

## 2022-01-04 ENCOUNTER — Ambulatory Visit: Payer: Medicaid Other

## 2022-01-09 ENCOUNTER — Ambulatory Visit: Payer: Medicaid Other | Admitting: Family Medicine

## 2022-01-25 ENCOUNTER — Telehealth: Payer: Self-pay | Admitting: *Deleted

## 2022-01-25 DIAGNOSIS — E108 Type 1 diabetes mellitus with unspecified complications: Secondary | ICD-10-CM

## 2022-01-25 MED ORDER — DEXCOM G6 SENSOR MISC: 1.0000 | 11 refills | Status: DC

## 2022-01-25 NOTE — Telephone Encounter (Signed)
Refill request for patients Dexcom G6 Sensor, there are three different ones in her chart, wasn't sure which one to attach a refill to .  Please send in appropriate refill to pharmacy.  [facility]/[address]. Bunny Lowdermilk Zimmerman Rumple, CMA

## 2022-01-25 NOTE — Telephone Encounter (Signed)
Sent in refill for Dexcom G6 sensor.   Sabrina Essex, MD

## 2022-01-26 ENCOUNTER — Ambulatory Visit (HOSPITAL_COMMUNITY)
Admission: EM | Admit: 2022-01-26 | Discharge: 2022-01-26 | Disposition: A | Discharge status: Home / Self Care | Payer: Medicaid Other | Attending: Student | Admitting: Student

## 2022-01-26 ENCOUNTER — Encounter (HOSPITAL_COMMUNITY): Payer: Self-pay

## 2022-01-26 DIAGNOSIS — N760 Acute vaginitis: Secondary | ICD-10-CM | POA: Diagnosis not present

## 2022-01-26 LAB — POC URINE PREG, ED: Preg Test, Ur: NEGATIVE

## 2022-01-26 MED ORDER — METRONIDAZOLE 500 MG PO TABS: 500.0000 mg | ORAL_TABLET | Freq: Two times a day (BID) | ORAL | 0 refills | Status: DC
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
## 2022-01-26 NOTE — Discharge Instructions (Addendum)
-  For bacterial vaginosis, start the antibiotic-Flagyl (metronidazole), 2 pills daily for 7 days.  You can take this with food if you have a sensitive stomach.  Avoid alcohol while taking this medication and for 2 days after as this will cause severe nausea and vomiting. -We have sent testing for sexually transmitted infections. We will notify you of any positive results once they are received. If required, we will prescribe any medications you might need. Please refrain from all sexual activity until treatment is complete.  -Seek additional medical attention if you develop fevers/chills, new/worsening abdominal pain, new/worsening vaginal discomfort/discharge, etc.

## 2022-01-26 NOTE — ED Triage Notes (Signed)
Pt presents with request for STD check. Pt endorses vaginal odor.

## 2022-01-27 ENCOUNTER — Telehealth (HOSPITAL_COMMUNITY): Payer: Self-pay | Admitting: Emergency Medicine

## 2022-01-27 NOTE — Telephone Encounter (Signed)
Received notification from lab that they are unable to process patient's cyto sample due to the foil being pierced.   Megan, RT attempted to reach patient yesterday (7/6) This RN attempted patient x 2 today, unable to LVM Will send letter

## 2022-02-06 ENCOUNTER — Encounter (HOSPITAL_COMMUNITY): Payer: Self-pay

## 2022-02-06 ENCOUNTER — Ambulatory Visit (HOSPITAL_COMMUNITY)
Admission: EM | Admit: 2022-02-06 | Discharge: 2022-02-06 | Disposition: A | Discharge status: Home / Self Care | Payer: Medicaid Other | Attending: Physician Assistant | Admitting: Physician Assistant

## 2022-02-06 DIAGNOSIS — Z113 Encounter for screening for infections with a predominantly sexual mode of transmission: Secondary | ICD-10-CM | POA: Insufficient documentation

## 2022-02-06 DIAGNOSIS — Z202 Contact with and (suspected) exposure to infections with a predominantly sexual mode of transmission: Secondary | ICD-10-CM

## 2022-02-06 NOTE — ED Triage Notes (Signed)
Patient was seen for std screenings and states the swab that was done had bacteria on it. Patient states she was told to come back and have her testing re-done.   Patient denies any symptoms today.

## 2022-02-06 NOTE — Discharge Instructions (Signed)
Will call with test results and send in treatment if indicated from results.  Return if you develop new symptoms Avoid sexually activity until test results are back and any necessary treatment is completed.

## 2022-02-07 ENCOUNTER — Telehealth (HOSPITAL_COMMUNITY): Payer: Self-pay | Admitting: Emergency Medicine

## 2022-02-07 LAB — CERVICOVAGINAL ANCILLARY ONLY
Bacterial Vaginitis (gardnerella): NEGATIVE
Candida Glabrata: POSITIVE — AB
Candida Vaginitis: POSITIVE — AB
Chlamydia: NEGATIVE
Comment: NEGATIVE
Comment: NEGATIVE
Comment: NEGATIVE
Comment: NEGATIVE
Comment: NEGATIVE
Comment: NORMAL
Neisseria Gonorrhea: NEGATIVE
Trichomonas: NEGATIVE

## 2022-02-07 MED ORDER — FLUCONAZOLE 150 MG PO TABS: 150.0000 mg | ORAL_TABLET | Freq: Once | ORAL | 0 refills | Status: AC

## 2022-02-13 ENCOUNTER — Encounter (HOSPITAL_COMMUNITY): Payer: Self-pay

## 2022-02-13 ENCOUNTER — Ambulatory Visit (HOSPITAL_COMMUNITY)
Admission: EM | Admit: 2022-02-13 | Discharge: 2022-02-13 | Disposition: A | Discharge status: Home / Self Care | Payer: Medicaid Other | Attending: Emergency Medicine | Admitting: Emergency Medicine

## 2022-02-13 DIAGNOSIS — S91312A Laceration without foreign body, left foot, initial encounter: Secondary | ICD-10-CM | POA: Diagnosis not present

## 2022-02-13 MED ORDER — IBUPROFEN 800 MG PO TABS: 800.0000 mg | ORAL_TABLET | Freq: Three times a day (TID) | ORAL | 0 refills | Status: DC
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
## 2022-02-13 NOTE — ED Triage Notes (Signed)
Pt states cut the bottom of her left foot on a broken pickle jar today. States EMS came out and wrapped it. Bleeding controlled at this time. States tetanus <5 yrs.

## 2022-02-13 NOTE — Discharge Instructions (Addendum)
5 sutures placed, return in 10 to 14 days for removal  Keep bandage in place until morning then may remove  Take ibuprofen 800 mg 3 times daily for the next 6 5 days to reduce inflammation and help manage your pain, may take Tylenol in addition to this  Cleanse daily with diluted soapy water, pat dry and cover with a nonstick bandage to keep from becoming contaminated due to placement  Please watch for signs of infection such as increased swelling, increased pain, puslike drainage, fever or chills, if this occurs at any point please return for reevaluation

## 2022-02-28 ENCOUNTER — Ambulatory Visit (HOSPITAL_COMMUNITY)
Admission: EM | Admit: 2022-02-28 | Discharge: 2022-02-28 | Discharge status: Against Medical Advice | Payer: Medicaid Other

## 2022-02-28 NOTE — ED Notes (Signed)
Pt called from waiting area without response. Per D. Watts (Patient Access): pt left.

## 2022-03-02 ENCOUNTER — Ambulatory Visit (HOSPITAL_COMMUNITY)
Admission: EM | Admit: 2022-03-02 | Discharge: 2022-03-02 | Disposition: A | Discharge status: Home / Self Care | Payer: Medicaid Other | Attending: Internal Medicine | Admitting: Internal Medicine

## 2022-03-02 NOTE — ED Triage Notes (Signed)
Pt is here for suture removal of right foot, removed 5sutures. Not redness or drainage noted.
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
## 2022-03-23 ENCOUNTER — Emergency Department (HOSPITAL_COMMUNITY)
Admission: EM | Admit: 2022-03-23 | Discharge: 2022-03-23 | Disposition: A | Discharge status: Home / Self Care | Payer: Medicaid Other | Attending: Emergency Medicine | Admitting: Emergency Medicine

## 2022-03-23 ENCOUNTER — Encounter (HOSPITAL_COMMUNITY): Payer: Self-pay | Admitting: Radiology

## 2022-03-23 ENCOUNTER — Other Ambulatory Visit: Payer: Self-pay

## 2022-03-23 ENCOUNTER — Emergency Department (HOSPITAL_COMMUNITY): Payer: Medicaid Other

## 2022-03-23 DIAGNOSIS — O208 Other hemorrhage in early pregnancy: Secondary | ICD-10-CM | POA: Diagnosis not present

## 2022-03-23 DIAGNOSIS — Z794 Long term (current) use of insulin: Secondary | ICD-10-CM | POA: Insufficient documentation

## 2022-03-23 DIAGNOSIS — Z3A01 Less than 8 weeks gestation of pregnancy: Secondary | ICD-10-CM | POA: Insufficient documentation

## 2022-03-23 DIAGNOSIS — E109 Type 1 diabetes mellitus without complications: Secondary | ICD-10-CM | POA: Diagnosis not present

## 2022-03-23 DIAGNOSIS — N3000 Acute cystitis without hematuria: Secondary | ICD-10-CM | POA: Diagnosis not present

## 2022-03-23 DIAGNOSIS — R109 Unspecified abdominal pain: Secondary | ICD-10-CM | POA: Diagnosis not present

## 2022-03-23 DIAGNOSIS — B9689 Other specified bacterial agents as the cause of diseases classified elsewhere: Secondary | ICD-10-CM | POA: Diagnosis not present

## 2022-03-23 DIAGNOSIS — R42 Dizziness and giddiness: Secondary | ICD-10-CM | POA: Diagnosis not present

## 2022-03-23 DIAGNOSIS — O219 Vomiting of pregnancy, unspecified: Secondary | ICD-10-CM | POA: Insufficient documentation

## 2022-03-23 DIAGNOSIS — E860 Dehydration: Secondary | ICD-10-CM | POA: Diagnosis not present

## 2022-03-23 DIAGNOSIS — R112 Nausea with vomiting, unspecified: Secondary | ICD-10-CM | POA: Diagnosis not present

## 2022-03-23 DIAGNOSIS — O2341 Unspecified infection of urinary tract in pregnancy, first trimester: Secondary | ICD-10-CM | POA: Insufficient documentation

## 2022-03-23 DIAGNOSIS — I959 Hypotension, unspecified: Secondary | ICD-10-CM | POA: Diagnosis not present

## 2022-03-23 DIAGNOSIS — R1111 Vomiting without nausea: Secondary | ICD-10-CM | POA: Diagnosis not present

## 2022-03-23 DIAGNOSIS — O26891 Other specified pregnancy related conditions, first trimester: Secondary | ICD-10-CM | POA: Diagnosis not present

## 2022-03-23 DIAGNOSIS — O2311 Infections of bladder in pregnancy, first trimester: Secondary | ICD-10-CM | POA: Diagnosis not present

## 2022-03-23 LAB — CBG MONITORING, ED
Glucose-Capillary: 130 mg/dL — ABNORMAL HIGH (ref 70–99)
Glucose-Capillary: 39 mg/dL — CL (ref 70–99)
Glucose-Capillary: 160 mg/dL — ABNORMAL HIGH (ref 70–99)

## 2022-03-23 LAB — COMPREHENSIVE METABOLIC PANEL
ALT: 23 U/L (ref 0–44)
AST: 19 U/L (ref 15–41)
Albumin: 4.2 g/dL (ref 3.5–5.0)
Alkaline Phosphatase: 73 U/L (ref 38–126)
Anion gap: 11 (ref 5–15)
BUN: 14 mg/dL (ref 6–20)
CO2: 24 mmol/L (ref 22–32)
Calcium: 9.1 mg/dL (ref 8.9–10.3)
Chloride: 99 mmol/L (ref 98–111)
Creatinine, Ser: 0.78 mg/dL (ref 0.44–1.00)
GFR, Estimated: >60 mL/min (ref >60)
Glucose, Bld: 133 mg/dL — ABNORMAL HIGH (ref 70–99)
Potassium: 2.9 mmol/L — ABNORMAL LOW (ref 3.5–5.1)
Sodium: 134 mmol/L — ABNORMAL LOW (ref 135–145)
Total Bilirubin: 0.9 mg/dL (ref 0.3–1.2)
Total Protein: 7.7 g/dL (ref 6.5–8.1)

## 2022-03-23 LAB — URINALYSIS, ROUTINE W REFLEX MICROSCOPIC
Bilirubin Urine: NEGATIVE
Glucose, UA: NEGATIVE mg/dL
Hgb urine dipstick: NEGATIVE
Ketones, ur: 80 mg/dL — AB
Nitrite: NEGATIVE
Protein, ur: 100 mg/dL — AB
Specific Gravity, Urine: 1.024 (ref 1.005–1.030)
pH: 5 (ref 5.0–8.0)

## 2022-03-23 LAB — CBC
HCT: 41 % (ref 36.0–46.0)
Hemoglobin: 14.4 g/dL (ref 12.0–15.0)
MCH: 29.2 pg (ref 26.0–34.0)
MCHC: 35.1 g/dL (ref 30.0–36.0)
MCV: 83.2 fL (ref 80.0–100.0)
Platelets: 357 10*3/uL (ref 150–400)
RBC: 4.93 MIL/uL (ref 3.87–5.11)
RDW: 15.5 % (ref 11.5–15.5)
WBC: 12.9 10*3/uL — ABNORMAL HIGH (ref 4.0–10.5)
nRBC: 0 % (ref 0.0–0.2)

## 2022-03-23 LAB — BLOOD GAS, VENOUS
Acid-Base Excess: 4.7 mmol/L — ABNORMAL HIGH (ref 0.0–2.0)
Bicarbonate: 29.2 mmol/L — ABNORMAL HIGH (ref 20.0–28.0)
O2 Saturation: 32.2 %
Patient temperature: 37
pCO2, Ven: 42 mmHg — ABNORMAL LOW (ref 44–60)
pH, Ven: 7.45 — ABNORMAL HIGH (ref 7.25–7.43)
pO2, Ven: <31 mmHg — CL (ref 32–45)

## 2022-03-23 LAB — LIPASE, BLOOD: Lipase: 21 U/L (ref 11–51)

## 2022-03-23 LAB — PREGNANCY, URINE: Preg Test, Ur: POSITIVE — AB

## 2022-03-23 MED ORDER — CEPHALEXIN 500 MG PO CAPS
500.0000 mg | ORAL_CAPSULE | Freq: Once | ORAL | Status: AC
  Administered 2022-03-23: 500 mg via ORAL
  Filled 2022-03-23: qty 1

## 2022-03-23 MED ORDER — SODIUM CHLORIDE 0.9 % IV BOLUS
1000.0000 mL | Freq: Once | INTRAVENOUS | Status: AC
  Administered 2022-03-23: 1000 mL via INTRAVENOUS

## 2022-03-23 MED ORDER — DOXYLAMINE-PYRIDOXINE 10-10 MG PO TBEC: 2.0000 | DELAYED_RELEASE_TABLET | Freq: Every evening | ORAL | 0 refills | Status: DC | PRN

## 2022-03-23 MED ORDER — ONDANSETRON HCL 4 MG/2ML IJ SOLN: 4.0000 mg | Freq: Once | INTRAMUSCULAR | Status: DC | PRN

## 2022-03-23 MED ORDER — CEPHALEXIN 500 MG PO CAPS: 500.0000 mg | ORAL_CAPSULE | Freq: Four times a day (QID) | ORAL | 0 refills | Status: DC

## 2022-03-23 MED ORDER — DEXTROSE 50 % IV SOLN
INTRAVENOUS | Status: AC
  Filled 2022-03-23: qty 50

## 2022-03-23 MED ORDER — POTASSIUM CHLORIDE CRYS ER 20 MEQ PO TBCR
40.0000 meq | EXTENDED_RELEASE_TABLET | Freq: Once | ORAL | Status: AC
  Administered 2022-03-23: 40 meq via ORAL
  Filled 2022-03-23: qty 2

## 2022-03-23 NOTE — ED Triage Notes (Signed)
N/V and back pain that started on Tuesday.. Orthostatic per EMS went from 98systolic down to 86 upon standing. No syncope but + for dizziness.  107/69 80 100% RA Cbg 162

## 2022-03-23 NOTE — Discharge Instructions (Addendum)
You were seen in the emergency department for nausea vomiting.  Your pregnancy test was positive and your ultrasound shows a 6-week intrauterine pregnancy.  Your urine showed possible signs of an infection and we are starting you on some antibiotics.  We are also starting you on some nausea medication.  Please contact OB for follow-up.  Return to the emergency department if any worsening or concerning symptoms.

## 2022-03-23 NOTE — ED Provider Triage Note (Signed)
Emergency Medicine Provider Triage Evaluation Note  Sabrina Sutton , a 26 y.o. female  was evaluated in triage.  Pt complains of nausea and vomiting.  Patient states that symptoms began on Tuesday evening.  She complains of having mild abdominal pain with her nausea and vomiting.  States that she has not been able to keep anything down.  She is a type I diabetic and so she is concerned.  She denies any changes to her insulin regimen.  She has been attempting to eat without success.  She denies any fevers or diarrhea.  Denies any recent illnesses..  Review of Systems  Positive:  Negative: See above  Physical Exam  BP 106/68 (BP Location: Right Arm)   Pulse 76   Temp 98.5 F (36.9 C) (Oral)   Resp 18   Ht '5\' 7"'$  (1.702 m)   Wt 54.4 kg   SpO2 98%   BMI 18.79 kg/m  Gen:   Awake, no distress   Resp:  Normal effort  MSK:   Moves extremities without difficulty  Other:  Abdomen is soft and nontender to palpation  Medical Decision Making  Medically screening exam initiated at 3:20 PM.  Appropriate orders placed.  Sabrina Sutton was informed that the remainder of the evaluation will be completed by another provider, this initial triage assessment does not replace that evaluation, and the importance of remaining in the ED until their evaluation is complete.     Mickie Hillier, PA-C 03/23/22 1521

## 2022-03-23 NOTE — ED Provider Notes (Signed)
[facility] DEPT Provider Note   CSN: [id number] Arrival date & time: 03/23/22  1502     History  Chief Complaint  Patient presents with   Emesis    Sabrina Sutton is a 26 y.o. female.  He has a history of type 1 diabetes on insulin.  Complaining of 2 days of nausea vomiting with some pain in her flanks and her low back.  No urinary symptoms no fevers.  No blood in the vomitus.  EMS found with low blood pressure.  No prior history of pregnancy..  The history is provided by the patient.  Emesis Severity:  Moderate Duration:  2 days Timing:  Intermittent Quality:  Stomach contents Progression:  Unchanged Chronicity:  New Recent urination:  Normal Relieved by:  None tried Worsened by:  Nothing Ineffective treatments:  None tried Associated symptoms: no abdominal pain, no cough, no diarrhea and no fever   Risk factors: diabetes   Risk factors: no sick contacts and no travel to endemic areas        Home Medications Prior to Admission medications   Medication Sig Start Date End Date Taking? Authorizing Provider  Accu-Chek FastClix Lancets MISC Check sugar 10 x daily 02/22/19   Wilber Oliphant, MD  acetaminophen (TYLENOL) 500 MG tablet Take 500 mg by mouth every 6 (six) hours as needed for mild pain, fever or headache. Patient not taking: Reported on 11/23/2021    [provider]  Alcohol Swabs (ALCOHOL PADS) 70 % PADS Use to wipe skin prior to insulin injections twice daily 02/22/19   Wilber Oliphant, MD  Continuous Blood Gluc Receiver (DEXCOM G6 RECEIVER) DEVI USE AS DIRECTED 09/21/21   Ezequiel Essex, MD  Continuous Blood Gluc Sensor (DEXCOM G6 SENSOR) MISC Inject 1 applicator into the skin as directed. (change sensor every 10 days) 01/25/22   Ezequiel Essex, MD  Continuous Blood Gluc Transmit (DEXCOM G6 TRANSMITTER) MISC INJECT 1 DEVICE UNDER THE SKIN AS DIRECTED UP TO 8 TIMES WITH [facility] NEW SENSOR 09/21/21   Ezequiel Essex, MD  FLUoxetine  (PROZAC) 10 MG capsule Take 1 capsule (10 mg total) by mouth daily. Patient not taking: Reported on 11/23/2021 04/07/21 04/07/22  Ileene Musa E, PA  glucose blood (ACCU-CHEK AVIVA PLUS) test strip 1 each by Other route See admin instructions. Use as instructed 09/21/21   Ezequiel Essex, MD  ibuprofen (ADVIL) 800 MG tablet Take 1 tablet (800 mg total) by mouth 3 (three) times daily. 02/13/22   White, Leitha Schuller, NP  injection device for insulin (INPEN 100-PINK-NOVO) DEVI Use as directed with Novolog cartridges. Patient not taking: Reported on 04/14/2021 09/30/20   Patriciaann Clan, DO  insulin lispro (HUMALOG KWIKPEN) 100 UNIT/ML KwikPen This is being substituted for Fiasp. Inject 8 Units into the skin with breakfast, with lunch, and with evening meal. Add 2 units Humalog with evening snacks if at least 2 hours past evening meal. Patient taking differently: Inject 2-8 Units into the skin See admin instructions. This is being substituted for Fiasp. Inject 8 Units into the skin with breakfast, with lunch, and with evening meal. Add 2 units Humalog with evening snacks if at least 2 hours past evening meal. 09/07/21   Ezequiel Essex, MD  Insulin Pen Needle (B-D UF III MINI PEN NEEDLES) 31G X 5 MM MISC USE TO CHECK BLOOD SUGAR IN THE MORNING BEFORE EATING, BEFORE EACH MEAL, AND AS NEEDED 04/01/21   Ezequiel Essex, MD  Lancets (ACCU-CHEK SOFT [facility]) lancets Use as  directed. 10/24/17   Kathrene Alu, MD  LANTUS SOLOSTAR 100 UNIT/ML Solostar Pen ADMINISTER 25 UNITS UNDER THE SKIN DAILY Patient taking differently: Inject 25 Units into the skin daily. 08/30/21   Ezequiel Essex, MD  metoCLOPramide (REGLAN) 10 MG tablet Take 1 tablet (10 mg total) by mouth every 8 (eight) hours as needed for nausea. Patient not taking: Reported on 01/26/2022 11/23/21   Lucrezia Starch, MD  ondansetron (ZOFRAN-ODT) 8 MG disintegrating tablet DISSOLVE 1 TABLET(8 MG) ON THE TONGUE EVERY 8 HOURS AS NEEDED FOR NAUSEA OR VOMITING Patient not  taking: Reported on 04/14/2021 11/19/20   Lucrezia Starch, MD  potassium chloride SA (KLOR-CON M) 20 MEQ tablet Take 1 tablet (20 mEq total) by mouth daily. Patient not taking: Reported on 01/26/2022 11/23/21   Lucrezia Starch, MD  traZODone (DESYREL) 50 MG tablet Take 1 tablet (50 mg total) by mouth at bedtime. Patient not taking: Reported on 11/23/2021 04/07/21   Malachy Mood, PA      Allergies    Patient has no known allergies.    Review of Systems   Review of Systems  Constitutional:  Negative for fever.  Respiratory:  Negative for cough.   Cardiovascular:  Negative for chest pain.  Gastrointestinal:  Positive for vomiting. Negative for abdominal pain and diarrhea.  Genitourinary:  Negative for dysuria, vaginal bleeding and vaginal discharge.  Musculoskeletal:  Positive for back pain.    Physical Exam Updated Vital Signs BP 120/78   Pulse (!) 112   Temp 98.7 F (37.1 C) (Oral)   Resp 17   Ht '5\' 7"'$  (1.702 m)   Wt 54.4 kg   SpO2 100%   BMI 18.79 kg/m  Physical Exam Vitals and nursing note reviewed.  Constitutional:      General: She is not in acute distress.    Appearance: Normal appearance. She is well-developed.  HENT:     Head: Normocephalic and atraumatic.  Eyes:     Conjunctiva/sclera: Conjunctivae normal.  Cardiovascular:     Rate and Rhythm: Normal rate and regular rhythm.     Heart sounds: No murmur heard. Pulmonary:     Effort: Pulmonary effort is normal. No respiratory distress.     Breath sounds: Normal breath sounds.  Abdominal:     Palpations: Abdomen is soft.     Tenderness: There is no abdominal tenderness. There is no guarding or rebound.  Musculoskeletal:        General: No swelling.     Cervical back: Neck supple.     Right lower leg: No edema.     Left lower leg: No edema.  Skin:    General: Skin is warm and dry.     Capillary Refill: Capillary refill takes less than 2 seconds.  Neurological:     General: No focal deficit present.      Mental Status: She is alert.     Gait: Gait normal.     ED Results / Procedures / Treatments   Labs (all labs ordered are listed, but only abnormal results are displayed) Labs Reviewed  COMPREHENSIVE METABOLIC PANEL - Abnormal; Notable for the following components:      Result Value   Sodium 134 (*)    Potassium 2.9 (*)    Glucose, Bld 133 (*)    All other components within normal limits  CBC - Abnormal; Notable for the following components:   WBC 12.9 (*)    All other components within normal limits  URINALYSIS,  ROUTINE W REFLEX MICROSCOPIC - Abnormal; Notable for the following components:   APPearance CLOUDY (*)    Ketones, ur 80 (*)    Protein, ur 100 (*)    Leukocytes,Ua TRACE (*)    Bacteria, UA MANY (*)    All other components within normal limits  BLOOD GAS, VENOUS - Abnormal; Notable for the following components:   pH, Ven 7.45 (*)    pCO2, Ven 42 (*)    pO2, Ven <31 (*)    Bicarbonate 29.2 (*)    Acid-Base Excess 4.7 (*)    All other components within normal limits  PREGNANCY, URINE - Abnormal; Notable for the following components:   Preg Test, Ur POSITIVE (*)    All other components within normal limits  CBG MONITORING, ED - Abnormal; Notable for the following components:   Glucose-Capillary 130 (*)    All other components within normal limits  CBG MONITORING, ED - Abnormal; Notable for the following components:   Glucose-Capillary 39 (*)    All other components within normal limits  CBG MONITORING, ED - Abnormal; Notable for the following components:   Glucose-Capillary 160 (*)    All other components within normal limits  LIPASE, BLOOD    EKG None  Radiology US OB Comp < 14 Wks  Result Date: 03/23/2022 CLINICAL DATA:  Pain nausea vomiting EXAM: OBSTETRIC <14 WK [place] AND TRANSVAGINAL OB US TECHNIQUE: Both transabdominal and transvaginal ultrasound examinations were performed for complete evaluation of the gestation as well as the maternal uterus, adnexal  regions, and pelvic cul-de-sac. Transvaginal technique was performed to assess early pregnancy. COMPARISON:  None Available. FINDINGS: Intrauterine gestational sac: Single intrauterine gestation. Yolk sac:  Visualized Embryo:  Visualized Cardiac Activity: Visualized Heart Rate: 99 bpm CRL:  3.5 mm   6 w   0 d                  [place] EDC: 11/16/2022 Subchorionic hemorrhage:  Small subchorionic hemorrhage. Maternal uterus/adnexae: Ovaries are within normal limits. Right ovary measures 3.6 x 1.7 x 2.1 cm. Left ovary measures 1.9 x 1.1 x 1.4 cm. No significant free fluid IMPRESSION: 1. Single viable intrauterine pregnancy as above. Somewhat low normal heart rate which may be in part related to very small size of the embryo. 2. Small subchorionic hemorrhage Electronically Signed   By: Donavan Foil M.D.   On: 03/23/2022 20:57   US OB Transvaginal  Result Date: 03/23/2022 CLINICAL DATA:  Pain nausea vomiting EXAM: OBSTETRIC <14 WK [place] AND TRANSVAGINAL OB US TECHNIQUE: Both transabdominal and transvaginal ultrasound examinations were performed for complete evaluation of the gestation as well as the maternal uterus, adnexal regions, and pelvic cul-de-sac. Transvaginal technique was performed to assess early pregnancy. COMPARISON:  None Available. FINDINGS: Intrauterine gestational sac: Single intrauterine gestation. Yolk sac:  Visualized Embryo:  Visualized Cardiac Activity: Visualized Heart Rate: 99 bpm CRL:  3.5 mm   6 w   0 d                  [place] EDC: 11/16/2022 Subchorionic hemorrhage:  Small subchorionic hemorrhage. Maternal uterus/adnexae: Ovaries are within normal limits. Right ovary measures 3.6 x 1.7 x 2.1 cm. Left ovary measures 1.9 x 1.1 x 1.4 cm. No significant free fluid IMPRESSION: 1. Single viable intrauterine pregnancy as above. Somewhat low normal heart rate which may be in part related to very small size of the embryo. 2. Small subchorionic hemorrhage Electronically Signed   By: Donavan Foil  M.D.   On:  03/23/2022 20:57    Procedures Procedures    Medications Ordered in ED Medications  ondansetron [facility]) injection 4 mg (has no administration in time range)  dextrose 50 % solution (  Given 03/23/22 1953)    ED Course/ Medical Decision Making/ A&P Clinical Course as of 03/24/22 0931  Thu Mar 23, 2022  2142 She has eaten some food here and getting IV fluids.  Work-up has shown low potassium, orally supplemented, urinalysis with ketones although gap normal likely more reflective of dehydration, possible infection with 11-20 white blood cells many bacteria although 11-20 squamous cells.  Will discharge on antibiotics and nausea medication and give contact information for OB. [MB]    Clinical Course User Index [MB] Hayden Rasmussen, MD                           Medical Decision Making Amount and/or Complexity of Data Reviewed Labs: ordered.  Risk Prescription drug management.   This patient complains of nausea and vomiting; this involves an extensive number of treatment Options and is a complaint that carries with it a high risk of complications and morbidity. The differential includes DKA, dehydration, hypoglycemia, metabolic derangement, pregnancy, hyperemesis,  I ordered, reviewed and interpreted labs, which included CBC with mildly elevated white count normal hemoglobin, chemistries with low potassium normal gap, VBG without acidosis, urinalysis with ketones possible signs of infection, pregnancy test positive I ordered medication IV glucose oral potassium oral antibiotics IV fluids with improvement in her symptoms and reviewed PMP when indicated. I ordered imaging studies which included pelvic ultrasound and I independently    visualized and interpreted imaging which showed viable IUP Previous records obtained and reviewed in epic no recent admissions Cardiac monitoring reviewed, normal sinus rhythm Social determinants considered, no significant barriers Critical  Interventions: None  After the interventions stated above, I reevaluated the patient and found patient is tolerating p.o. here benign abdominal exam well-appearing Admission and further testing considered, no indications for admission at this time.  We will put on medication for her hyperemesis and antibiotics for bacteriuria in pregnancy.  Given contact information for OB follow-up.  Return instructions discussed         Final Clinical Impression(s) / ED Diagnoses Final diagnoses:  Nausea and vomiting during pregnancy  Urinary tract infection in mother during first trimester of pregnancy    Rx / DC Orders ED Discharge Orders     None         Hayden Rasmussen, MD 03/24/22 [phone]

## 2022-03-28 ENCOUNTER — Telehealth: Payer: Self-pay

## 2022-03-28 DIAGNOSIS — E108 Type 1 diabetes mellitus with unspecified complications: Secondary | ICD-10-CM

## 2022-03-28 DIAGNOSIS — Z3490 Encounter for supervision of normal pregnancy, unspecified, unspecified trimester: Secondary | ICD-10-CM | POA: Insufficient documentation

## 2022-03-28 DIAGNOSIS — Z3A01 Less than 8 weeks gestation of pregnancy: Secondary | ICD-10-CM

## 2022-03-28 NOTE — Telephone Encounter (Signed)
Patient calls nurse line regarding headache. She is approx [redacted] weeks pregnant. Reports that headache slightly improves with rest and tylenol. Denies visual changes, lightheadedness or dizziness, vaginal bleeding or abdominal cramping.   Provided with supportive measures for headache. Patient was previously scheduled by front office for tomorrow morning. Encouraged patient to keep this appointment for evaluation. Provided with MAU precautions. Patient verbalizes understanding.   Talbot Grumbling, RN

## 2022-03-28 NOTE — Progress Notes (Signed)
    SUBJECTIVE:   CHIEF COMPLAINT / HPI:   Sabrina Sutton is a 26 y.o. female who presents to the [facility] clinic today to discuss the following concerns:   Pregnancy She was recently seen in the ED on 8/31 for nausea, vomiting.  She had a positive pregnancy test at that time, also noted to have many bacteria in her urine with trace leukocytes. Bacteruria treated with keflex.  Her nausea was treated with Zofran in the ED, she was discharged with rx for diclegis.   She states that this was not an intended pregnancy but she is excited about it. She notes that the baby's father had wanted her to get an abortion- he is wanting a DNA test to confirm it is his. She also notes that her family is also not very supportive. She thinks all this stress is causing her to have a migraine.   Her LMP was 7/20. She gets normal monthly periods. No contraception in the last 3 months. She has not yet started taking prenatal vitamins. She is still taking abx for her bacteruria. The diclegis helps with her nausea.   Type 1 DM Diagnosed 2017. States that she has not seen in an endocrinologist. Her diabetes is mostly managed in the [facility] clinic. Currently takes 10U of Humalog TID with meals, taking 25U of Lantus. She has a Dexcom to check sugars. This morning it was high in the 200s- she thinks she may have forgotten to take her insulin last night. Sugars are usually 200s-300s during the day.   Migraine States that yesterday it was a 10, today it is improved and she rates it as a 6-7. She describes it as a sharp pain/pressure on right temple. Doesn't usually get migraines. Has been present for the last day or two. Took Tylenol 1000 mg once without much relief.   PERTINENT  PMH / PSH: Type 1 DM, History of cannabis hyperemesis, tobacco dependence, HSV-2  OBJECTIVE:   BP 110/74   Pulse (!) 108   Wt 125 lb (56.7 kg)   LMP 02/09/2022 (Within Days)   BMI 19.58 kg/m    General: NAD, pleasant, able to participate in  exam Respiratory: Normal respiratory effort on room air Abdomen: Uterus not above umbilicus  Skin: warm and dry, no rashes noted Psych: Normal affect and mood  ASSESSMENT/PLAN:   1. Type 1 diabetes mellitus with complications (HCC) Hemoglobin A1c today is 8.7, this is an improvement from her A1c in March that was 10.7. -Advised her to log her fasting sugars -She will need to be followed by OB/GYN for her pregnancy due to her type 1 diabetes -Advised her to increase her Lantus to 20 units twice daily, continue Humalog 10 units with meals 3 times daily  2. Supervision of high risk pregnancy, antepartum Referral placed to OB/GYN due to this high risk pregnancy given her diabetes.  Advised her to call to schedule an appointment, we will see her in our clinic up until then.  She should let us know if she is unable to be seen in the OB/GYN clinic in the next month. -Rx prenatal vitamins to take daily -Continue Keflex course for her bacteriuria -Continue Diclegis as needed -Full med rec was performed today. She was provided with a list of medications safe in pregnancy -Extensive discussion today about how diabetes can affect her fetus  - Hgb Fractionation [place] - CBC/D/Plt+RPR+Rh+ABO+RubIgG...  Sharion Settler, [place]

## 2022-03-28 NOTE — Telephone Encounter (Signed)
Noted and agree.   Given that patient is newly pregnant with severe uncontrolled diabetes, will place referral to [facility] for high risk prenatal care.   [facility] RN team, please schedule patient appointment as soon as possible to discuss medication management for early pregnancy and collection of labs.   Ezequiel Essex, MD

## 2022-03-29 ENCOUNTER — Ambulatory Visit (INDEPENDENT_AMBULATORY_CARE_PROVIDER_SITE_OTHER): Payer: Medicaid Other | Admitting: Family Medicine

## 2022-03-29 VITALS — BP 110/74 | HR 108 | Wt 125.0 lb

## 2022-03-29 DIAGNOSIS — O0990 Supervision of high risk pregnancy, unspecified, unspecified trimester: Secondary | ICD-10-CM

## 2022-03-29 DIAGNOSIS — E108 Type 1 diabetes mellitus with unspecified complications: Secondary | ICD-10-CM

## 2022-03-29 DIAGNOSIS — O0991 Supervision of high risk pregnancy, unspecified, first trimester: Secondary | ICD-10-CM | POA: Diagnosis not present

## 2022-03-29 DIAGNOSIS — Z3A01 Less than 8 weeks gestation of pregnancy: Secondary | ICD-10-CM

## 2022-03-29 LAB — POCT GLYCOSYLATED HEMOGLOBIN (HGB A1C): HbA1c, POC (controlled diabetic range): 8.7 % — AB (ref 0.0–7.0)

## 2022-03-29 MED ORDER — PRENATAL VITAMIN 27-0.8 MG PO TABS
1.0000 | ORAL_TABLET | Freq: Every day | ORAL | 3 refills | Status: DC
Start: 2022-03-29 — End: 2022-10-22

## 2022-03-29 NOTE — Patient Instructions (Addendum)
It was wonderful to see you today.  Please bring ALL of your medications with you to every visit.   Today we talked about:  -We have sent a referral to the OBGYN for your pregnancy. They will call you for an appointment but you can also call them. [facility] for [facility] [place]: [address], [place], [place] [address] Phone: [phone]  Go up on your Lantus to 25U twice a day.  Record your morning fasting sugars.   If you experience any vaginal bleeding, leakage of fluids, don't feel your baby moving as much, or start to have contractions less than 5 minutes apart that are lasting for 1 hour and painful, please go directly to the Maternal Assessment Unit at [facility] for evaluation.  Maternity and women's care services located on the [place] side of [facility]. [facility] (Entrance C off [address]  [address] [place],  [place]  [address]      Thank you for choosing [facility].   Please call [phone] with any questions about today's appointment.  Please be sure to schedule follow up at the front  desk before you leave today.   Sharion Settler, DO PGY-3 Family Medicine

## 2022-03-31 LAB — MICROSCOPIC EXAMINATION
Bacteria, UA: NONE SEEN
Casts: NONE SEEN /lpf
RBC, Urine: NONE SEEN /hpf (ref 0–2)
WBC, UA: NONE SEEN /hpf (ref 0–5)

## 2022-03-31 LAB — CBC/D/PLT+RPR+RH+ABO+RUBIGG...
Antibody Screen: NEGATIVE
Basophils Absolute: 0 10*3/uL (ref 0.0–0.2)
Basos: 0 %
Bilirubin, UA: NEGATIVE
EOS (ABSOLUTE): 0.1 10*3/uL (ref 0.0–0.4)
Eos: 1 %
HCV Ab: NONREACTIVE
HIV Screen 4th Generation wRfx: NONREACTIVE
Hematocrit: 42.4 % (ref 34.0–46.6)
Hemoglobin: 13.7 g/dL (ref 11.1–15.9)
Hepatitis B Surface Ag: NEGATIVE
Immature Grans (Abs): 0 10*3/uL (ref 0.0–0.1)
Immature Granulocytes: 0 %
Leukocytes,UA: NEGATIVE
Lymphocytes Absolute: 2.8 10*3/uL (ref 0.7–3.1)
Lymphs: 23 %
MCH: 28.6 pg (ref 26.6–33.0)
MCHC: 32.3 g/dL (ref 31.5–35.7)
MCV: 89 fL (ref 79–97)
Monocytes Absolute: 0.8 10*3/uL (ref 0.1–0.9)
Monocytes: 7 %
Neutrophils Absolute: 8.3 10*3/uL — ABNORMAL HIGH (ref 1.4–7.0)
Neutrophils: 69 %
Nitrite, UA: NEGATIVE
Platelets: 315 10*3/uL (ref 150–450)
Protein,UA: NEGATIVE
RBC, UA: NEGATIVE
RBC: 4.79 x10E6/uL (ref 3.77–5.28)
RDW: 14 % (ref 11.7–15.4)
RPR Ser Ql: NONREACTIVE
Rh Factor: POSITIVE
Rubella Antibodies, IGG: 2.03 index (ref >0.99)
Specific Gravity, UA: >=1.03 — AB (ref 1.005–1.030)
Urobilinogen, Ur: 0.2 mg/dL (ref 0.2–1.0)
WBC: 12.1 10*3/uL — ABNORMAL HIGH (ref 3.4–10.8)
pH, UA: 6 (ref 5.0–7.5)

## 2022-03-31 LAB — HGB FRACTIONATION CASCADE
Hgb A2: 2.5 % (ref 1.8–3.2)
Hgb A: 97.5 % (ref 96.4–98.8)
Hgb F: 0 % (ref 0.0–2.0)
Hgb S: 0 %

## 2022-03-31 LAB — HCV INTERPRETATION

## 2022-03-31 LAB — URINE CULTURE, OB REFLEX: Organism ID, Bacteria: NO GROWTH

## 2022-04-17 ENCOUNTER — Encounter: Payer: Self-pay | Admitting: Family Medicine

## 2022-04-18 MED ORDER — DOXYLAMINE-PYRIDOXINE 10-10 MG PO TBEC
2.0000 | DELAYED_RELEASE_TABLET | Freq: Every evening | ORAL | 0 refills | Status: DC | PRN
Start: 2022-04-18 — End: 2022-06-09

## 2022-04-20 ENCOUNTER — Ambulatory Visit (INDEPENDENT_AMBULATORY_CARE_PROVIDER_SITE_OTHER): Payer: Medicaid Other

## 2022-04-20 VITALS — BP 134/95 | HR 105 | Wt 118.2 lb

## 2022-04-20 DIAGNOSIS — Z3A10 10 weeks gestation of pregnancy: Secondary | ICD-10-CM

## 2022-04-20 DIAGNOSIS — O0990 Supervision of high risk pregnancy, unspecified, unspecified trimester: Secondary | ICD-10-CM | POA: Insufficient documentation

## 2022-04-20 DIAGNOSIS — O0991 Supervision of high risk pregnancy, unspecified, first trimester: Secondary | ICD-10-CM | POA: Diagnosis not present

## 2022-04-20 HISTORY — DX: Supervision of high risk pregnancy, unspecified, unspecified trimester: O09.90

## 2022-04-20 MED ORDER — BLOOD PRESSURE KIT DEVI: 1.0000 | 0 refills | Status: DC

## 2022-04-20 MED ORDER — GOJJI WEIGHT SCALE MISC: 1.0000 | 0 refills | Status: DC

## 2022-04-20 NOTE — Progress Notes (Signed)
New OB Intake  I connected with  Sabrina Sutton on 04/20/22 at 10:15 AM EDT by in person and verified that I am speaking with the correct person using two identifiers. Nurse is located at [facility] and pt is located at [facility].  I discussed the limitations, risks, security and privacy concerns of performing an evaluation and management service by telephone and the availability of in person appointments. I also discussed with the patient that there may be a patient responsible charge related to this service. The patient expressed understanding and agreed to proceed.  I explained I am completing New OB Intake today. We discussed her EDD of 11/15/21 that is based on LMP of 02/09/22. Pt is G1/P0. I reviewed her allergies, medications, Medical/Surgical/OB history, and appropriate screenings. I informed her of [facility] services. [facility] information placed in AVS. Based on history, this is a/an  pregnancy complicated by Type 1 Diabetes  .   Patient Active Problem List   Diagnosis Date Noted   Supervision of high-risk pregnancy 03/29/2022   Pregnancy 03/28/2022   Abdominal pain 11/28/2021   Moderate episode of recurrent major depressive disorder ([place]) 04/07/2021   Anxiety state 04/07/2021   Healthcare maintenance 03/04/2021   Tobacco dependence 11/12/2020   Depression, recurrent ([place]) 12/19/2019   Cannabis hyperemesis syndrome concurrent with and due to cannabis abuse ([place]) 12/19/2019   General counseling on prescription of oral contraceptives 03/04/2019   Hypokalemia 10/13/2018   Renal mass 08/21/2018   Condyloma acuminatum of vulva 10/31/2017   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 Patient given information for [facility] Healthy Baby website for more information about Women's and [place]. Patient is not interested in water birth.  Offered upcoming OB visit with CNM to discuss further.  MyChart/Babyscripts MyChart access verified. I explained pt will have some visits in office and some virtually. Babyscripts instructions given and order placed. Patient verifies receipt of registration text/e-mail. Account successfully created and app downloaded.  Blood Pressure Cuff/Weight Scale Blood pressure cuff ordered for patient to pick-up from [employer]. Explained after first prenatal appt pt will check weekly and document in 62. Patient does / does not  have weight scale. Weight scale ordered for patient to pick up from [employer].   Anatomy US Explained first scheduled [place] will be around 19 weeks. Anatomy US to be scheduled at [facility] visit.   Labs Discussed Johnsie Cancel genetic screening with patient. Would like both Panorama and Horizon drawn at new OB visit. Routine prenatal labs needed.  Covid Vaccine Patient has covid vaccine.   Is patient a CenteringPregnancy candidate?  Not a Candidate Declined due to  Not a candidate due to DM Centering Patient" indicated on sticky note  Social Determinants of Health Food Insecurity: Patient denies food insecurity. WIC Referral: Patient is interested in referral to [facility].  Transportation: Patient denies transportation needs. Childcare: Discussed no children allowed at ultrasound appointments. Offered childcare services; patient declines childcare services at this time.  First visit review I reviewed new OB appt with pt. I explained she will have a provider visit that includes prenatal labs, pap smear, std screening, genetic screening, and discuss plan of care pregnancy. Explained pt will be seen by [place] at first visit; encounter routed to appropriate provider. Explained that patient will be seen by pregnancy navigator following visit with provider.   Harland Aguiniga  Elba Barman, RN 04/20/2022  10:10 AM

## 2022-04-21 NOTE — Progress Notes (Signed)
Patient was assessed and managed by nursing staff during this encounter. I have reviewed the chart and agree with the documentation and plan. I have also made any necessary editorial changes.  Griffin Basil, MD 04/21/2022 9:26 AM

## 2022-05-04 ENCOUNTER — Encounter (HOSPITAL_COMMUNITY): Payer: Self-pay | Admitting: Obstetrics & Gynecology

## 2022-05-04 ENCOUNTER — Inpatient Hospital Stay (HOSPITAL_COMMUNITY)
Admission: AD | Admit: 2022-05-04 | Discharge: 2022-05-04 | Disposition: A | Discharge status: Home / Self Care | Payer: Medicaid Other | Attending: Obstetrics & Gynecology | Admitting: Obstetrics & Gynecology

## 2022-05-04 DIAGNOSIS — E876 Hypokalemia: Secondary | ICD-10-CM | POA: Insufficient documentation

## 2022-05-04 DIAGNOSIS — R1111 Vomiting without nausea: Secondary | ICD-10-CM | POA: Diagnosis not present

## 2022-05-04 DIAGNOSIS — O26891 Other specified pregnancy related conditions, first trimester: Secondary | ICD-10-CM | POA: Diagnosis not present

## 2022-05-04 DIAGNOSIS — E108 Type 1 diabetes mellitus with unspecified complications: Secondary | ICD-10-CM

## 2022-05-04 DIAGNOSIS — O24111 Pre-existing diabetes mellitus, type 2, in pregnancy, first trimester: Secondary | ICD-10-CM | POA: Insufficient documentation

## 2022-05-04 DIAGNOSIS — Z794 Long term (current) use of insulin: Secondary | ICD-10-CM | POA: Diagnosis not present

## 2022-05-04 DIAGNOSIS — O99281 Endocrine, nutritional and metabolic diseases complicating pregnancy, first trimester: Secondary | ICD-10-CM | POA: Diagnosis not present

## 2022-05-04 DIAGNOSIS — O219 Vomiting of pregnancy, unspecified: Secondary | ICD-10-CM | POA: Diagnosis not present

## 2022-05-04 DIAGNOSIS — R109 Unspecified abdominal pain: Secondary | ICD-10-CM | POA: Diagnosis not present

## 2022-05-04 DIAGNOSIS — Z3A12 12 weeks gestation of pregnancy: Secondary | ICD-10-CM | POA: Insufficient documentation

## 2022-05-04 LAB — CBC
HCT: 41.3 % (ref 36.0–46.0)
Hemoglobin: 15.1 g/dL — ABNORMAL HIGH (ref 12.0–15.0)
MCH: 29.7 pg (ref 26.0–34.0)
MCHC: 36.6 g/dL — ABNORMAL HIGH (ref 30.0–36.0)
MCV: 81.3 fL (ref 80.0–100.0)
Platelets: 382 10*3/uL (ref 150–400)
RBC: 5.08 MIL/uL (ref 3.87–5.11)
RDW: 14 % (ref 11.5–15.5)
WBC: 15.8 10*3/uL — ABNORMAL HIGH (ref 4.0–10.5)
nRBC: 0 % (ref 0.0–0.2)

## 2022-05-04 LAB — URINALYSIS, ROUTINE W REFLEX MICROSCOPIC
Bacteria, UA: NONE SEEN
Bilirubin Urine: NEGATIVE
Glucose, UA: 150 mg/dL — AB
Hgb urine dipstick: NEGATIVE
Ketones, ur: 80 mg/dL — AB
Nitrite: NEGATIVE
Protein, ur: 30 mg/dL — AB
Specific Gravity, Urine: 1.029 (ref 1.005–1.030)
pH: 5 (ref 5.0–8.0)

## 2022-05-04 LAB — BETA-HYDROXYBUTYRIC ACID: Beta-Hydroxybutyric Acid: 0.8 mmol/L — ABNORMAL HIGH (ref 0.05–0.27)

## 2022-05-04 LAB — COMPREHENSIVE METABOLIC PANEL
ALT: 16 U/L (ref 0–44)
AST: 19 U/L (ref 15–41)
Albumin: 4.4 g/dL (ref 3.5–5.0)
Alkaline Phosphatase: 57 U/L (ref 38–126)
Anion gap: 14 (ref 5–15)
BUN: 9 mg/dL (ref 6–20)
CO2: 24 mmol/L (ref 22–32)
Calcium: 10.2 mg/dL (ref 8.9–10.3)
Chloride: 94 mmol/L — ABNORMAL LOW (ref 98–111)
Creatinine, Ser: 0.78 mg/dL (ref 0.44–1.00)
GFR, Estimated: >60 mL/min (ref >60)
Glucose, Bld: 87 mg/dL (ref 70–99)
Potassium: 3.1 mmol/L — ABNORMAL LOW (ref 3.5–5.1)
Sodium: 132 mmol/L — ABNORMAL LOW (ref 135–145)
Total Bilirubin: 0.8 mg/dL (ref 0.3–1.2)
Total Protein: 8.1 g/dL (ref 6.5–8.1)

## 2022-05-04 LAB — GLUCOSE, CAPILLARY: Glucose-Capillary: 68 mg/dL — ABNORMAL LOW (ref 70–99)

## 2022-05-04 MED ORDER — ONDANSETRON HCL 4 MG/2ML IJ SOLN
4.0000 mg | Freq: Once | INTRAMUSCULAR | Status: AC
  Administered 2022-05-04: 4 mg via INTRAVENOUS
  Filled 2022-05-04: qty 2

## 2022-05-04 MED ORDER — METOCLOPRAMIDE HCL 10 MG PO TABS: 10.0000 mg | ORAL_TABLET | Freq: Three times a day (TID) | ORAL | 0 refills | Status: DC | PRN

## 2022-05-04 MED ORDER — ONDANSETRON 4 MG PO TBDP: 4.0000 mg | ORAL_TABLET | Freq: Three times a day (TID) | ORAL | 0 refills | Status: DC | PRN

## 2022-05-04 MED ORDER — LACTATED RINGERS IV BOLUS
1000.0000 mL | Freq: Once | INTRAVENOUS | Status: AC
  Administered 2022-05-04: 1000 mL via INTRAVENOUS

## 2022-05-04 MED ORDER — FAMOTIDINE IN NACL 20-0.9 MG/50ML-% IV SOLN
20.0000 mg | Freq: Once | INTRAVENOUS | Status: AC
Start: 2022-05-04 — End: 2022-05-04
  Administered 2022-05-04: 20 mg via INTRAVENOUS
  Filled 2022-05-04: qty 50

## 2022-05-04 MED ORDER — POTASSIUM CHLORIDE CRYS ER 20 MEQ PO TBCR
20.0000 meq | EXTENDED_RELEASE_TABLET | Freq: Once | ORAL | Status: AC
  Administered 2022-05-04: 20 meq via ORAL
  Filled 2022-05-04 (×2): qty 1

## 2022-05-04 MED ORDER — SODIUM CHLORIDE 0.9% FLUSH: 10.0000 mL | Freq: Two times a day (BID) | INTRAVENOUS | Status: DC

## 2022-05-04 MED ORDER — SODIUM CHLORIDE 0.9% FLUSH: 10.0000 mL | INTRAVENOUS | Status: DC | PRN
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
## 2022-05-04 NOTE — MAU Note (Signed)
EMS Arrival. C/O N/V and abd  pian for several weeks on and off. Had pain with vomiting last night and just can't keep anything down.  Pt is diabetic BS 120 with EMS.

## 2022-05-04 NOTE — MAU Note (Signed)
.  Sabrina Sutton is a 26 y.o. at 13w0dhere in MAU reporting: type 1 diabetic. Has been having nausea and vomiting on Tuesday nigh. C/o pain on her left side and back. Not able to keep much down.  LMP:  Onset of complaint: Tuesday Pain score: 5 Vitals:   05/04/22 1439  BP: 116/80  Pulse: (!) 120  Resp: 18  Temp: 98.7 F (37.1 C)     FHT:152 Lab orders placed from triage: u/a

## 2022-05-22 ENCOUNTER — Other Ambulatory Visit: Payer: Self-pay | Admitting: *Deleted

## 2022-05-22 ENCOUNTER — Telehealth: Payer: Self-pay

## 2022-05-22 DIAGNOSIS — O219 Vomiting of pregnancy, unspecified: Secondary | ICD-10-CM

## 2022-05-22 MED ORDER — ONDANSETRON 4 MG PO TBDP: 4.0000 mg | ORAL_TABLET | Freq: Three times a day (TID) | ORAL | 0 refills | Status: DC | PRN

## 2022-05-22 NOTE — Progress Notes (Signed)
TC from pt reporting cont'd nausea and vomiting in early pregnancy. Recent visit to MAU for same on 05/04/22. Requests refill Zofran. Reports has not eaten since Tuesday night. Refill Zofran sent. Advised to seek care in MAU if unable to tolerate meds or fluids. Pt is and insulin dependent diabetic. Current CBG by Dexcom is 143 essentially fasting.

## 2022-05-22 NOTE — Telephone Encounter (Signed)
Patient calls nurse line regarding issues with vomiting. She states that she has been having these issues since last week.   She called OBGYN this morning and they prescribed her Zofran, however, she was unable to get appointment to be seen. She has OB visit with them on 11/2.   She states that she was told by OBGYN to take zofran and if symptoms did not improve or she was unable to tolerate fluids, she would need to go to MAU.   She requested appointment at our office for further evaluation. We do not have any appointments for today.   Advised of MAU precautions if she did not have improvement with Zofran.   Patient verbalizes understanding.   Talbot Grumbling, RN

## 2022-05-23 ENCOUNTER — Encounter: Payer: Medicaid Other | Admitting: Family Medicine

## 2022-05-23 ENCOUNTER — Encounter (HOSPITAL_COMMUNITY): Payer: Self-pay | Admitting: Obstetrics and Gynecology

## 2022-05-23 ENCOUNTER — Inpatient Hospital Stay (HOSPITAL_COMMUNITY)
Admission: AD | Admit: 2022-05-23 | Discharge: 2022-05-23 | Disposition: A | Discharge status: Home / Self Care | Payer: Medicaid Other | Attending: Obstetrics and Gynecology | Admitting: Obstetrics and Gynecology

## 2022-05-23 DIAGNOSIS — Z3A14 14 weeks gestation of pregnancy: Secondary | ICD-10-CM

## 2022-05-23 DIAGNOSIS — O26892 Other specified pregnancy related conditions, second trimester: Secondary | ICD-10-CM | POA: Diagnosis present

## 2022-05-23 DIAGNOSIS — O210 Mild hyperemesis gravidarum: Secondary | ICD-10-CM | POA: Diagnosis not present

## 2022-05-23 LAB — CBC WITH DIFFERENTIAL/PLATELET
Abs Immature Granulocytes: 0.06 10*3/uL (ref 0.00–0.07)
Basophils Absolute: 0 10*3/uL (ref 0.0–0.1)
Basophils Relative: 0 %
Eosinophils Absolute: 0 10*3/uL (ref 0.0–0.5)
Eosinophils Relative: 0 %
HCT: 35.6 % — ABNORMAL LOW (ref 36.0–46.0)
Hemoglobin: 12.7 g/dL (ref 12.0–15.0)
Immature Granulocytes: 1 %
Lymphocytes Relative: 25 %
Lymphs Abs: 2.3 10*3/uL (ref 0.7–4.0)
MCH: 30 pg (ref 26.0–34.0)
MCHC: 35.7 g/dL (ref 30.0–36.0)
MCV: 84.2 fL (ref 80.0–100.0)
Monocytes Absolute: 0.4 10*3/uL (ref 0.1–1.0)
Monocytes Relative: 5 %
Neutro Abs: 6.4 10*3/uL (ref 1.7–7.7)
Neutrophils Relative %: 69 %
Platelets: 336 10*3/uL (ref 150–400)
RBC: 4.23 MIL/uL (ref 3.87–5.11)
RDW: 13.4 % (ref 11.5–15.5)
WBC: 9.2 10*3/uL (ref 4.0–10.5)
nRBC: 0 % (ref 0.0–0.2)

## 2022-05-23 LAB — COMPREHENSIVE METABOLIC PANEL
ALT: 12 U/L (ref 0–44)
AST: 14 U/L — ABNORMAL LOW (ref 15–41)
Albumin: 3.5 g/dL (ref 3.5–5.0)
Alkaline Phosphatase: 43 U/L (ref 38–126)
Anion gap: 9 (ref 5–15)
BUN: 7 mg/dL (ref 6–20)
CO2: 22 mmol/L (ref 22–32)
Calcium: 9.7 mg/dL (ref 8.9–10.3)
Chloride: 102 mmol/L (ref 98–111)
Creatinine, Ser: 0.58 mg/dL (ref 0.44–1.00)
GFR, Estimated: >60 mL/min (ref >60)
Glucose, Bld: 100 mg/dL — ABNORMAL HIGH (ref 70–99)
Potassium: 3.4 mmol/L — ABNORMAL LOW (ref 3.5–5.1)
Sodium: 133 mmol/L — ABNORMAL LOW (ref 135–145)
Total Bilirubin: 0.3 mg/dL (ref 0.3–1.2)
Total Protein: 6.7 g/dL (ref 6.5–8.1)

## 2022-05-23 LAB — URINALYSIS, ROUTINE W REFLEX MICROSCOPIC
Bacteria, UA: NONE SEEN
Bilirubin Urine: NEGATIVE
Glucose, UA: 150 mg/dL — AB
Hgb urine dipstick: NEGATIVE
Ketones, ur: 80 mg/dL — AB
Leukocytes,Ua: NEGATIVE
Nitrite: NEGATIVE
Protein, ur: 30 mg/dL — AB
Specific Gravity, Urine: 1.023 (ref 1.005–1.030)
pH: 5 (ref 5.0–8.0)

## 2022-05-23 LAB — GLUCOSE, CAPILLARY
Glucose-Capillary: 39 mg/dL — CL (ref 70–99)
Glucose-Capillary: 262 mg/dL — ABNORMAL HIGH (ref 70–99)

## 2022-05-23 MED ORDER — M.V.I. ADULT IV INJ
Freq: Once | INTRAVENOUS | Status: AC
  Filled 2022-05-23: qty 1000

## 2022-05-23 MED ORDER — LACTATED RINGERS IV BOLUS
1000.0000 mL | Freq: Once | INTRAVENOUS | Status: AC
  Administered 2022-05-23: 1000 mL via INTRAVENOUS

## 2022-05-23 MED ORDER — PROCHLORPERAZINE EDISYLATE 10 MG/2ML IJ SOLN
10.0000 mg | Freq: Once | INTRAMUSCULAR | Status: AC
  Administered 2022-05-23: 10 mg via INTRAVENOUS
  Filled 2022-05-23: qty 2

## 2022-05-23 MED ORDER — FAMOTIDINE IN NACL 20-0.9 MG/50ML-% IV SOLN
20.0000 mg | Freq: Once | INTRAVENOUS | Status: AC
  Administered 2022-05-23: 20 mg via INTRAVENOUS
  Filled 2022-05-23: qty 50

## 2022-05-23 NOTE — Discharge Instructions (Signed)
PLEASE PICK UP YOUR ZOFRAN PRESCRIPTION

## 2022-05-23 NOTE — MAU Note (Signed)
Sabrina Sutton is a 26 y.o. at 72w5dhere in MAU reporting: ongoing vomiting, throwing up since Wed.  Having really bad back pain (mid back), has been really uncomfortable.   Onset of complaint: Wed Pain score: 8 Vitals:   05/23/22 1220  BP: 117/74  Pulse: (!) 130  Resp: 18  Temp: 98.7 F (37.1 C)  SpO2: 100%     FHT:146 Lab orders placed from triage:  urine
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
## 2022-05-25 ENCOUNTER — Ambulatory Visit (INDEPENDENT_AMBULATORY_CARE_PROVIDER_SITE_OTHER): Payer: Medicaid Other | Admitting: Licensed Clinical Social Worker

## 2022-05-25 ENCOUNTER — Ambulatory Visit (INDEPENDENT_AMBULATORY_CARE_PROVIDER_SITE_OTHER): Payer: Medicaid Other | Admitting: Certified Nurse Midwife

## 2022-05-25 ENCOUNTER — Encounter: Payer: Self-pay | Admitting: Certified Nurse Midwife

## 2022-05-25 VITALS — BP 112/71 | HR 102 | Wt 118.6 lb

## 2022-05-25 DIAGNOSIS — F4322 Adjustment disorder with anxiety: Secondary | ICD-10-CM

## 2022-05-25 DIAGNOSIS — Z3A15 15 weeks gestation of pregnancy: Secondary | ICD-10-CM

## 2022-05-25 DIAGNOSIS — F419 Anxiety disorder, unspecified: Secondary | ICD-10-CM

## 2022-05-25 DIAGNOSIS — O0932 Supervision of pregnancy with insufficient antenatal care, second trimester: Secondary | ICD-10-CM

## 2022-05-25 DIAGNOSIS — O0992 Supervision of high risk pregnancy, unspecified, second trimester: Secondary | ICD-10-CM

## 2022-05-25 DIAGNOSIS — E108 Type 1 diabetes mellitus with unspecified complications: Secondary | ICD-10-CM

## 2022-05-25 DIAGNOSIS — F12188 Cannabis abuse with other cannabis-induced disorder: Secondary | ICD-10-CM

## 2022-05-25 DIAGNOSIS — O0990 Supervision of high risk pregnancy, unspecified, unspecified trimester: Secondary | ICD-10-CM

## 2022-05-25 DIAGNOSIS — B009 Herpesviral infection, unspecified: Secondary | ICD-10-CM

## 2022-05-25 NOTE — Progress Notes (Signed)
Pt presents for NOB visit. No concerns at this time.
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
## 2022-05-26 ENCOUNTER — Telehealth: Payer: Self-pay | Admitting: *Deleted

## 2022-05-26 NOTE — Telephone Encounter (Signed)
TC from pt reporting cramping at 15.[redacted] wks GA. Pt has voided this morning. Pt denies VB. Advised hydration and rest. If cramps do not resolve or if VB or if pt is concerned for urgent symptoms she is to seek care in MAU. Pt verbalized understanding and knowledge of location of MAU.

## 2022-05-27 ENCOUNTER — Encounter (HOSPITAL_COMMUNITY): Payer: Self-pay | Admitting: Obstetrics and Gynecology

## 2022-05-27 ENCOUNTER — Other Ambulatory Visit: Payer: Self-pay

## 2022-05-27 ENCOUNTER — Inpatient Hospital Stay (HOSPITAL_COMMUNITY)
Admission: AD | Admit: 2022-05-27 | Discharge: 2022-05-27 | Disposition: A | Discharge status: Home / Self Care | Payer: Medicaid Other | Attending: Obstetrics and Gynecology | Admitting: Obstetrics and Gynecology

## 2022-05-27 DIAGNOSIS — O26892 Other specified pregnancy related conditions, second trimester: Secondary | ICD-10-CM | POA: Insufficient documentation

## 2022-05-27 DIAGNOSIS — Z3A15 15 weeks gestation of pregnancy: Secondary | ICD-10-CM

## 2022-05-27 DIAGNOSIS — Z794 Long term (current) use of insulin: Secondary | ICD-10-CM | POA: Insufficient documentation

## 2022-05-27 DIAGNOSIS — O24012 Pre-existing diabetes mellitus, type 1, in pregnancy, second trimester: Secondary | ICD-10-CM | POA: Diagnosis not present

## 2022-05-27 DIAGNOSIS — O99282 Endocrine, nutritional and metabolic diseases complicating pregnancy, second trimester: Secondary | ICD-10-CM | POA: Diagnosis not present

## 2022-05-27 DIAGNOSIS — O219 Vomiting of pregnancy, unspecified: Secondary | ICD-10-CM

## 2022-05-27 DIAGNOSIS — O26899 Other specified pregnancy related conditions, unspecified trimester: Secondary | ICD-10-CM

## 2022-05-27 DIAGNOSIS — E876 Hypokalemia: Secondary | ICD-10-CM

## 2022-05-27 DIAGNOSIS — Z3492 Encounter for supervision of normal pregnancy, unspecified, second trimester: Secondary | ICD-10-CM

## 2022-05-27 DIAGNOSIS — R109 Unspecified abdominal pain: Secondary | ICD-10-CM | POA: Insufficient documentation

## 2022-05-27 LAB — COMPREHENSIVE METABOLIC PANEL
ALT: 11 U/L (ref 0–44)
AST: 16 U/L (ref 15–41)
Albumin: 4.2 g/dL (ref 3.5–5.0)
Alkaline Phosphatase: 45 U/L (ref 38–126)
Anion gap: 14 (ref 5–15)
BUN: <5 mg/dL — ABNORMAL LOW (ref 6–20)
CO2: 24 mmol/L (ref 22–32)
Calcium: 10 mg/dL (ref 8.9–10.3)
Chloride: 97 mmol/L — ABNORMAL LOW (ref 98–111)
Creatinine, Ser: 0.61 mg/dL (ref 0.44–1.00)
GFR, Estimated: >60 mL/min (ref >60)
Glucose, Bld: 57 mg/dL — ABNORMAL LOW (ref 70–99)
Potassium: 3 mmol/L — ABNORMAL LOW (ref 3.5–5.1)
Sodium: 135 mmol/L (ref 135–145)
Total Bilirubin: 0.4 mg/dL (ref 0.3–1.2)
Total Protein: 7.9 g/dL (ref 6.5–8.1)

## 2022-05-27 LAB — GLUCOSE, CAPILLARY
Glucose-Capillary: 47 mg/dL — ABNORMAL LOW (ref 70–99)
Glucose-Capillary: 220 mg/dL — ABNORMAL HIGH (ref 70–99)
Glucose-Capillary: 54 mg/dL — ABNORMAL LOW (ref 70–99)
Glucose-Capillary: 130 mg/dL — ABNORMAL HIGH (ref 70–99)
Glucose-Capillary: 76 mg/dL (ref 70–99)

## 2022-05-27 LAB — CBC
HCT: 38.4 % (ref 36.0–46.0)
Hemoglobin: 13.3 g/dL (ref 12.0–15.0)
MCH: 29.6 pg (ref 26.0–34.0)
MCHC: 34.6 g/dL (ref 30.0–36.0)
MCV: 85.3 fL (ref 80.0–100.0)
Platelets: 384 10*3/uL (ref 150–400)
RBC: 4.5 MIL/uL (ref 3.87–5.11)
RDW: 13.6 % (ref 11.5–15.5)
WBC: 12.2 10*3/uL — ABNORMAL HIGH (ref 4.0–10.5)
nRBC: 0 % (ref 0.0–0.2)

## 2022-05-27 LAB — URINALYSIS, ROUTINE W REFLEX MICROSCOPIC
Bacteria, UA: NONE SEEN
Bilirubin Urine: NEGATIVE
Glucose, UA: >=500 mg/dL — AB
Hgb urine dipstick: NEGATIVE
Ketones, ur: 20 mg/dL — AB
Leukocytes,Ua: NEGATIVE
Nitrite: NEGATIVE
Protein, ur: NEGATIVE mg/dL
Specific Gravity, Urine: 1.017 (ref 1.005–1.030)
pH: 8 (ref 5.0–8.0)

## 2022-05-27 LAB — WET PREP, GENITAL
Clue Cells Wet Prep HPF POC: NONE SEEN
Sperm: NONE SEEN
Trich, Wet Prep: NONE SEEN
WBC, Wet Prep HPF POC: <10 (ref <10)
Yeast Wet Prep HPF POC: NONE SEEN

## 2022-05-27 LAB — BETA-HYDROXYBUTYRIC ACID: Beta-Hydroxybutyric Acid: 0.65 mmol/L — ABNORMAL HIGH (ref 0.05–0.27)

## 2022-05-27 MED ORDER — DEXTROSE 50 % IV SOLN
50.0000 mL | Freq: Once | INTRAVENOUS | Status: AC
  Administered 2022-05-27: 50 mL via INTRAVENOUS

## 2022-05-27 MED ORDER — GLUCOSE 40 % PO GEL
1.0000 | Freq: Once | ORAL | Status: AC
  Administered 2022-05-27: 31 g via ORAL

## 2022-05-27 MED ORDER — METOCLOPRAMIDE HCL 5 MG/ML IJ SOLN
10.0000 mg | Freq: Once | INTRAMUSCULAR | Status: AC
  Administered 2022-05-27: 10 mg via INTRAVENOUS
  Filled 2022-05-27: qty 2

## 2022-05-27 MED ORDER — GLUCOSE 40 % PO GEL
ORAL | Status: AC
  Filled 2022-05-27: qty 1

## 2022-05-27 MED ORDER — POTASSIUM CHLORIDE CRYS ER 20 MEQ PO TBCR: 20.0000 meq | EXTENDED_RELEASE_TABLET | Freq: Two times a day (BID) | ORAL | 0 refills | Status: DC

## 2022-05-27 MED ORDER — ACETAMINOPHEN 500 MG PO TABS
1000.0000 mg | ORAL_TABLET | Freq: Once | ORAL | Status: AC
  Administered 2022-05-27: 1000 mg via ORAL
  Filled 2022-05-27: qty 2

## 2022-05-27 MED ORDER — ONDANSETRON 4 MG PO TBDP
4.0000 mg | ORAL_TABLET | Freq: Once | ORAL | Status: AC
  Administered 2022-05-27: 4 mg via ORAL
  Filled 2022-05-27: qty 1

## 2022-05-27 MED ORDER — DEXTROSE 50 % IV SOLN
INTRAVENOUS | Status: AC
  Filled 2022-05-27: qty 50

## 2022-05-27 MED ORDER — ENSURE HIGH PROTEIN PO LIQD: 1.0000 | Freq: Every day | ORAL | 8 refills | Status: DC

## 2022-05-27 MED ORDER — METOCLOPRAMIDE HCL 10 MG PO TABS: 10.0000 mg | ORAL_TABLET | Freq: Four times a day (QID) | ORAL | 0 refills | Status: DC

## 2022-05-27 MED ORDER — DEXTROSE 5 % IV SOLN: INTRAVENOUS | Status: DC

## 2022-05-27 MED ORDER — LACTATED RINGERS IV BOLUS
1000.0000 mL | Freq: Once | INTRAVENOUS | Status: AC
  Administered 2022-05-27: 1000 mL via INTRAVENOUS

## 2022-05-27 MED ORDER — POTASSIUM CHLORIDE 10 MEQ/100ML IV SOLN
10.0000 meq | INTRAVENOUS | Status: AC
  Administered 2022-05-27 (×3): 10 meq via INTRAVENOUS
  Filled 2022-05-27 (×3): qty 100

## 2022-05-27 NOTE — Progress Notes (Signed)
Maryagnes Amos CNM in earlier to discuss d/c plan. Written and verbal d/c instructions given and understanding voiced.
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
## 2022-05-27 NOTE — MAU Note (Signed)
CBG 47. Notified J.Walker. Glucose gel ordered.

## 2022-05-27 NOTE — MAU Note (Signed)
Sabrina Sutton is a 26 y.o. at 26w2dhere in MAU reporting: she's having lower abdominal cramping that began yesterday and vomiting that began today.  Reports has vomited 3x today.  Denies VB. LMP: NA Onset of complaint: cramping Pain score: 8 Vitals:   05/27/22 1532  BP: 126/75  Pulse: (!) 112  Resp: 20  Temp: 97.7 F (36.5 C)  SpO2: 100%     FHT:143 bpm Lab orders placed from triage:   UA

## 2022-05-27 NOTE — MAU Note (Signed)
Pt stated she felt like her Blood sugar was dropping a gain. CBG 57. Notified J.Walker,CNM and  1 amp IV D50 ordered and given.

## 2022-05-28 DIAGNOSIS — H5213 Myopia, bilateral: Secondary | ICD-10-CM | POA: Diagnosis not present

## 2022-05-28 LAB — CULTURE, OB URINE

## 2022-05-29 ENCOUNTER — Ambulatory Visit (INDEPENDENT_AMBULATORY_CARE_PROVIDER_SITE_OTHER): Payer: Medicaid Other | Admitting: Family Medicine

## 2022-05-29 VITALS — BP 101/60 | HR 107 | Wt 121.6 lb

## 2022-05-29 DIAGNOSIS — E108 Type 1 diabetes mellitus with unspecified complications: Secondary | ICD-10-CM | POA: Diagnosis not present

## 2022-05-29 DIAGNOSIS — Z3482 Encounter for supervision of other normal pregnancy, second trimester: Secondary | ICD-10-CM

## 2022-05-29 DIAGNOSIS — Z3A15 15 weeks gestation of pregnancy: Secondary | ICD-10-CM

## 2022-05-29 DIAGNOSIS — O24012 Pre-existing diabetes mellitus, type 1, in pregnancy, second trimester: Secondary | ICD-10-CM

## 2022-05-29 LAB — GC/CHLAMYDIA PROBE AMP (~~LOC~~) NOT AT ARMC
Chlamydia: NEGATIVE
Comment: NEGATIVE
Comment: NORMAL
Neisseria Gonorrhea: NEGATIVE

## 2022-05-29 NOTE — Patient Instructions (Signed)
It was wonderful to see you today.  Today we talked about:  -Take 25 units of your Lantus in the morning, 10 units in the evening.  Continue to take your Humalog.  Hold your evening Lantus if you do not eat dinner.  -Follow-up with your OB/GYN as a scheduled.  Thank you for coming to your visit as scheduled. We have had a large "no-show" problem lately, and this significantly limits our ability to see and care for patients. As a friendly reminder- if you cannot make your appointment please call to cancel. We do have a no show policy for those who do not cancel within 24 hours. Our policy is that if you miss or fail to cancel an appointment within 24 hours, 3 times in a 50-monthperiod, you may be dismissed from our clinic.   Thank you for choosing [facility]   Please call [phone] any questions about today's appointment.  Please be sure to schedule follow up at the front  desk before you leave today.   ASharion Settler DO PGY-3 Family Medicine

## 2022-05-29 NOTE — Assessment & Plan Note (Signed)
Patient has uncontrolled type 1 diabetes.  She is on basal and bolus insulin. She is currently pregnant and followed by OBGYN for her high risk pregnancy. Lately she has been experiencing morning low sugars.  This is likely also exacerbated by the fact that she has had poor appetite.  Of note, she has gained less than 2 pounds during her pregnancy thus far.  Several attempts were made to review her Dexcom but her new phone is not compatible and she did not bring her reader with her today. Recommendations for today: -Decrease evening Lantus to 10U, continue 25U in the AM -Continue mealtime humalog 10U TID -Hold evening Lantus if she does not eat dinner (or eats minimally) -OBGYN to follow and adjust as needed

## 2022-05-29 NOTE — Progress Notes (Signed)
    SUBJECTIVE:   CHIEF COMPLAINT / HPI:   Sabrina Sutton is a 26 y.o. female who presents to the [facility] clinic today to discuss the following concerns:   MAU Follow Up She is followed by [place] for her high risk pregnancy.  She was recently seen in the MAU on 11/4 for nausea and vomiting and was found to be hypoglycemic with a CBG of 47.  She was given D5, IV fluids, and potassium replacements.  Today she reports that she often wakes up with low sugars. She has been taking 25U of Lantus in the morning and 20U in the evening along with 10U of Humalog TID. States that if her sugar is above 120 she does take her evening Lantus and her sugars are above 70 when she wakes up. She holds her evening Lantus if her sugar is less than 120. She also notes decreased appetite lately. She has intermittent nausea for which she takes diclegis and Zofran. She also has a prescription for Reglan but she has not picked this prescription.   PERTINENT  PMH / PSH: T1DM  OBJECTIVE:   BP 101/60   Pulse (!) 107   Wt 121 lb 9.6 oz (55.2 kg)   LMP 02/09/2022 (Within Days)   BMI 19.05 kg/m    General: NAD, pleasant, able to participate in exam Respiratory: normal effort Abdomen: Thin abdomen. FHR 130 Psych: Normal affect and mood  ASSESSMENT/PLAN:   Type 1 diabetes mellitus with complications ([place]) Patient has uncontrolled type 1 diabetes.  She is on basal and bolus insulin. She is currently pregnant and followed by OBGYN for her high risk pregnancy. Lately she has been experiencing morning low sugars.  This is likely also exacerbated by the fact that she has had poor appetite.  Of note, she has gained less than 2 pounds during her pregnancy thus far.  Several attempts were made to review her Dexcom but her new phone is not compatible and she did not bring her reader with her today. Recommendations for today: -Decrease evening Lantus to 10U, continue 25U in the AM -Continue mealtime humalog 10U  TID -Hold evening Lantus if she does not eat dinner (or eats minimally) -OBGYN to follow and adjust as needed    Sharion Settler, [place]

## 2022-05-29 NOTE — BH Specialist Note (Signed)
Integrated Behavioral Health Initial In-Person Visit  MRN: [id number] Name: Sabrina Sutton  Number of [place] Clinician visits: 1 Session Start time:   330pm Session End time: 346pm Total time in minutes: 16 mins in person at [facility]   Types of Service: [place] (BHI)  Interpretor:No. Interpretor Name and Language: none   Warm Hand Off Completed.        Subjective: Sabrina Sutton is a 26 y.o. female accompanied by n/a Patient was referred by Candie Chroman CNM for anxiety. Patient reports the following symptoms/concerns: anxiety Duration of problem: approx 3 months ; Severity of problem: mild  Objective: Mood: good  and Affect: Appropriate Risk of harm to self or others: No plan to harm self or others  Life Context: Family and Social: Lives with family  School/Work: n/a Self-Care: sleep  Life Changes: new pregnancy  Patient and/or Family's Strengths/Protective Factors: Concrete supports in place (healthy food, safe environments, etc.)  Goals Addressed: Patient will: Reduce symptoms of: anxiety/adjustment disorder Increase knowledge and/or ability of: coping skills and healthy habits  Demonstrate ability to: Increase healthy adjustment to current life circumstances  Progress towards Goals: Ongoing  Interventions: Interventions utilized: Motivational Interviewing and Supportive Counseling  Standardized Assessments completed: PHQ 9  Patient and/or Family Response: Sabrina Sutton reports difficulty sleeping, feeling on edge, social isolation and anxious mood. Sabrina Sutton reports anxious mood increase when discussing labor.     Assessment: Patient currently experiencing anxiety affecting pregnancy.   Patient may benefit from integrated behavioral health.  Plan: Follow up with behavioral health clinician on : 06/22/2022 Behavioral recommendations: Prioritize rest, deep breathing, yoga and/or mindfulness activity to  reduce anxious mood and feeling on edge.  Referral(s): [place] (In Clinic) "From scale of 1-10, how likely are you to follow plan?":    Sabrina Ferrier, LCSW

## 2022-06-01 ENCOUNTER — Inpatient Hospital Stay (HOSPITAL_COMMUNITY)
Admission: AD | Admit: 2022-06-01 | Discharge: 2022-06-04 | DRG: 831 | Discharge status: Against Medical Advice | Payer: Medicaid Other | Attending: Obstetrics and Gynecology | Admitting: Obstetrics and Gynecology

## 2022-06-01 ENCOUNTER — Encounter (HOSPITAL_COMMUNITY): Payer: Self-pay | Admitting: Obstetrics and Gynecology

## 2022-06-01 DIAGNOSIS — Z3A16 16 weeks gestation of pregnancy: Secondary | ICD-10-CM

## 2022-06-01 DIAGNOSIS — O26892 Other specified pregnancy related conditions, second trimester: Secondary | ICD-10-CM | POA: Diagnosis present

## 2022-06-01 DIAGNOSIS — M545 Low back pain, unspecified: Secondary | ICD-10-CM | POA: Diagnosis not present

## 2022-06-01 DIAGNOSIS — E1010 Type 1 diabetes mellitus with ketoacidosis without coma: Secondary | ICD-10-CM | POA: Diagnosis present

## 2022-06-01 DIAGNOSIS — Z794 Long term (current) use of insulin: Secondary | ICD-10-CM | POA: Diagnosis not present

## 2022-06-01 DIAGNOSIS — O24012 Pre-existing diabetes mellitus, type 1, in pregnancy, second trimester: Secondary | ICD-10-CM | POA: Diagnosis not present

## 2022-06-01 DIAGNOSIS — O219 Vomiting of pregnancy, unspecified: Secondary | ICD-10-CM | POA: Diagnosis not present

## 2022-06-01 DIAGNOSIS — F419 Anxiety disorder, unspecified: Secondary | ICD-10-CM

## 2022-06-01 DIAGNOSIS — Z5329 Procedure and treatment not carried out because of patient's decision for other reasons: Secondary | ICD-10-CM | POA: Diagnosis present

## 2022-06-01 DIAGNOSIS — Z87891 Personal history of nicotine dependence: Secondary | ICD-10-CM

## 2022-06-01 DIAGNOSIS — O0990 Supervision of high risk pregnancy, unspecified, unspecified trimester: Secondary | ICD-10-CM

## 2022-06-01 DIAGNOSIS — R112 Nausea with vomiting, unspecified: Secondary | ICD-10-CM | POA: Diagnosis not present

## 2022-06-01 HISTORY — DX: Type 1 diabetes mellitus with ketoacidosis without coma: E10.10

## 2022-06-01 LAB — HEMOGLOBIN A1C
Hgb A1c MFr Bld: 6.4 % — ABNORMAL HIGH (ref 4.8–5.6)
Mean Plasma Glucose: 136.98 mg/dL

## 2022-06-01 LAB — GLUCOSE, CAPILLARY
Glucose-Capillary: 104 mg/dL — ABNORMAL HIGH (ref 70–99)
Glucose-Capillary: 106 mg/dL — ABNORMAL HIGH (ref 70–99)
Glucose-Capillary: 149 mg/dL — ABNORMAL HIGH (ref 70–99)
Glucose-Capillary: 254 mg/dL — ABNORMAL HIGH (ref 70–99)
Glucose-Capillary: 39 mg/dL — CL (ref 70–99)
Glucose-Capillary: 42 mg/dL — CL (ref 70–99)
Glucose-Capillary: 67 mg/dL — ABNORMAL LOW (ref 70–99)
Glucose-Capillary: 70 mg/dL (ref 70–99)
Glucose-Capillary: 70 mg/dL (ref 70–99)
Glucose-Capillary: 73 mg/dL (ref 70–99)
Glucose-Capillary: 73 mg/dL (ref 70–99)
Glucose-Capillary: 88 mg/dL (ref 70–99)
Glucose-Capillary: 94 mg/dL (ref 70–99)
Glucose-Capillary: 96 mg/dL (ref 70–99)

## 2022-06-01 LAB — CBC
HCT: 33.3 % — ABNORMAL LOW (ref 36.0–46.0)
Hemoglobin: 11.8 g/dL — ABNORMAL LOW (ref 12.0–15.0)
MCH: 29.7 pg (ref 26.0–34.0)
MCHC: 35.4 g/dL (ref 30.0–36.0)
MCV: 83.9 fL (ref 80.0–100.0)
Platelets: 396 10*3/uL (ref 150–400)
RBC: 3.97 MIL/uL (ref 3.87–5.11)
RDW: 14.1 % (ref 11.5–15.5)
WBC: 11.6 10*3/uL — ABNORMAL HIGH (ref 4.0–10.5)
nRBC: 0 % (ref 0.0–0.2)

## 2022-06-01 LAB — BASIC METABOLIC PANEL
Anion gap: 10 (ref 5–15)
Anion gap: 7 (ref 5–15)
BUN: 6 mg/dL (ref 6–20)
BUN: <5 mg/dL — ABNORMAL LOW (ref 6–20)
CO2: 21 mmol/L — ABNORMAL LOW (ref 22–32)
CO2: 21 mmol/L — ABNORMAL LOW (ref 22–32)
Calcium: 8.8 mg/dL — ABNORMAL LOW (ref 8.9–10.3)
Calcium: 8.9 mg/dL (ref 8.9–10.3)
Chloride: 106 mmol/L (ref 98–111)
Chloride: 107 mmol/L (ref 98–111)
Creatinine, Ser: 0.58 mg/dL (ref 0.44–1.00)
Creatinine, Ser: 0.56 mg/dL (ref 0.44–1.00)
GFR, Estimated: >60 mL/min (ref >60)
GFR, Estimated: >60 mL/min (ref >60)
Glucose, Bld: 73 mg/dL (ref 70–99)
Glucose, Bld: 81 mg/dL (ref 70–99)
Potassium: 3.2 mmol/L — ABNORMAL LOW (ref 3.5–5.1)
Potassium: 3.5 mmol/L (ref 3.5–5.1)
Sodium: 137 mmol/L (ref 135–145)
Sodium: 135 mmol/L (ref 135–145)

## 2022-06-01 LAB — URINALYSIS, ROUTINE W REFLEX MICROSCOPIC
Bilirubin Urine: NEGATIVE
Glucose, UA: >=500 mg/dL — AB
Hgb urine dipstick: NEGATIVE
Ketones, ur: 80 mg/dL — AB
Leukocytes,Ua: NEGATIVE
Nitrite: NEGATIVE
Protein, ur: 30 mg/dL — AB
Specific Gravity, Urine: 1.025 (ref 1.005–1.030)
pH: 6 (ref 5.0–8.0)

## 2022-06-01 LAB — COMPREHENSIVE METABOLIC PANEL
ALT: 9 U/L (ref 0–44)
AST: 18 U/L (ref 15–41)
Albumin: 3.6 g/dL (ref 3.5–5.0)
Alkaline Phosphatase: 44 U/L (ref 38–126)
Anion gap: 15 (ref 5–15)
BUN: 7 mg/dL (ref 6–20)
CO2: 23 mmol/L (ref 22–32)
Calcium: 9.5 mg/dL (ref 8.9–10.3)
Chloride: 98 mmol/L (ref 98–111)
Creatinine, Ser: 0.72 mg/dL (ref 0.44–1.00)
GFR, Estimated: >60 mL/min (ref >60)
Glucose, Bld: 246 mg/dL — ABNORMAL HIGH (ref 70–99)
Potassium: 3.4 mmol/L — ABNORMAL LOW (ref 3.5–5.1)
Sodium: 136 mmol/L (ref 135–145)
Total Bilirubin: 0.7 mg/dL (ref 0.3–1.2)
Total Protein: 7.3 g/dL (ref 6.5–8.1)

## 2022-06-01 LAB — TYPE AND SCREEN
ABO/RH(D): O POS
Antibody Screen: NEGATIVE

## 2022-06-01 LAB — BETA-HYDROXYBUTYRIC ACID
Beta-Hydroxybutyric Acid: 1.63 mmol/L — ABNORMAL HIGH (ref 0.05–0.27)
Beta-Hydroxybutyric Acid: 0.27 mmol/L (ref 0.05–0.27)

## 2022-06-01 MED ORDER — DOCUSATE SODIUM 100 MG PO CAPS: 100.0000 mg | ORAL_CAPSULE | Freq: Every day | ORAL | Status: DC

## 2022-06-01 MED ORDER — LACTATED RINGERS IV BOLUS: 1000.0000 mL | Freq: Once | INTRAVENOUS | Status: DC

## 2022-06-01 MED ORDER — PROCHLORPERAZINE EDISYLATE 10 MG/2ML IJ SOLN
10.0000 mg | Freq: Once | INTRAMUSCULAR | Status: AC
  Administered 2022-06-01: 10 mg via INTRAVENOUS
  Filled 2022-06-01: qty 2

## 2022-06-01 MED ORDER — POLYETHYLENE GLYCOL 3350 17 G PO PACK
17.0000 g | PACK | Freq: Every day | ORAL | Status: DC
  Administered 2022-06-02: 17 g via ORAL
  Filled 2022-06-01 (×2): qty 1

## 2022-06-01 MED ORDER — INSULIN ASPART 100 UNIT/ML IJ SOLN
6.0000 [IU] | Freq: Once | INTRAMUSCULAR | Status: AC
  Administered 2022-06-01: 6 [IU] via SUBCUTANEOUS

## 2022-06-01 MED ORDER — POTASSIUM CHLORIDE 10 MEQ/100ML IV SOLN
10.0000 meq | INTRAVENOUS | Status: DC
  Filled 2022-06-01 (×4): qty 100

## 2022-06-01 MED ORDER — SCOPOLAMINE 1 MG/3DAYS TD PT72
1.0000 | MEDICATED_PATCH | TRANSDERMAL | Status: DC
  Administered 2022-06-01: 1.5 mg via TRANSDERMAL
  Filled 2022-06-01: qty 1

## 2022-06-01 MED ORDER — DEXTROSE 50 % IV SOLN
INTRAVENOUS | Status: AC
  Filled 2022-06-01: qty 50

## 2022-06-01 MED ORDER — CALCIUM CARBONATE ANTACID 500 MG PO CHEW: 2.0000 | CHEWABLE_TABLET | ORAL | Status: DC | PRN

## 2022-06-01 MED ORDER — PRENATAL MULTIVITAMIN CH
1.0000 | ORAL_TABLET | Freq: Every day | ORAL | Status: DC
  Administered 2022-06-01 – 2022-06-03 (×3): 1 via ORAL
  Filled 2022-06-01 (×3): qty 1

## 2022-06-01 MED ORDER — POTASSIUM CHLORIDE CRYS ER 20 MEQ PO TBCR
40.0000 meq | EXTENDED_RELEASE_TABLET | Freq: Once | ORAL | Status: AC
  Administered 2022-06-01: 40 meq via ORAL
  Filled 2022-06-01: qty 2

## 2022-06-01 MED ORDER — LACTATED RINGERS IV BOLUS: 20.0000 mL/kg | Freq: Once | INTRAVENOUS | Status: DC

## 2022-06-01 MED ORDER — ONDANSETRON HCL 4 MG/2ML IJ SOLN
4.0000 mg | INTRAMUSCULAR | Status: DC | PRN
  Administered 2022-06-01 – 2022-06-02 (×3): 4 mg via INTRAVENOUS
  Filled 2022-06-01 (×2): qty 2

## 2022-06-01 MED ORDER — LACTATED RINGERS IV SOLN: INTRAVENOUS | Status: DC

## 2022-06-01 MED ORDER — DEXTROSE IN LACTATED RINGERS 5 % IV SOLN: INTRAVENOUS | Status: DC

## 2022-06-01 MED ORDER — SODIUM CHLORIDE 0.9 % IV SOLN: Freq: Once | INTRAVENOUS | Status: DC

## 2022-06-01 MED ORDER — ACETAMINOPHEN 325 MG PO TABS
650.0000 mg | ORAL_TABLET | ORAL | Status: DC | PRN
  Administered 2022-06-01 – 2022-06-03 (×5): 650 mg via ORAL
  Filled 2022-06-01 (×5): qty 2

## 2022-06-01 MED ORDER — INSULIN REGULAR(HUMAN) IN NACL 100-0.9 UT/100ML-% IV SOLN
INTRAVENOUS | Status: DC
  Administered 2022-06-01: 0.7 [IU]/h via INTRAVENOUS
  Filled 2022-06-01 (×2): qty 100

## 2022-06-01 MED ORDER — POTASSIUM CHLORIDE 10 MEQ/100ML IV SOLN
10.0000 meq | INTRAVENOUS | Status: AC
  Administered 2022-06-01 (×4): 10 meq via INTRAVENOUS

## 2022-06-01 MED ORDER — DEXTROSE 50 % IV SOLN
0.0000 mL | INTRAVENOUS | Status: DC | PRN
  Administered 2022-06-01 (×2): 20 mL via INTRAVENOUS
  Filled 2022-06-01: qty 50

## 2022-06-01 MED ORDER — SODIUM CHLORIDE 0.9 % IV SOLN: INTRAVENOUS | Status: DC

## 2022-06-01 MED ORDER — ONDANSETRON HCL 4 MG/2ML IJ SOLN
INTRAMUSCULAR | Status: AC
  Filled 2022-06-01: qty 2

## 2022-06-01 MED ORDER — ONDANSETRON HCL 4 MG/2ML IJ SOLN: 4.0000 mg | Freq: Once | INTRAMUSCULAR | Status: DC

## 2022-06-01 MED ORDER — SODIUM CHLORIDE 0.9 % IV SOLN
12.5000 mg | Freq: Four times a day (QID) | INTRAVENOUS | Status: DC | PRN
  Administered 2022-06-01 – 2022-06-03 (×3): 12.5 mg via INTRAVENOUS
  Filled 2022-06-01 (×4): qty 0.5

## 2022-06-01 NOTE — MAU Note (Signed)
Sabrina Sutton is a 26 y.o. at 15w0dhere in MAU reporting: pt moaning and crying out in triage.  Unable to stand upright. Been throwing up since Tuesday, having chills and back pain. (Rt lower back)  pain started on Tues, none on Wed, just started again.  Is on medication for vomiting, last took it yesterday, after she took meds, only threw up twice yesterday morning, was good the rest of the day, started again around 0500. Denies vag bleeding, no diarrhea. Last BM was Sunday or Monday.  Onset of complaint: Tues Pain score: 8 Vitals:   06/01/22 0918  BP: (!) 141/82  Pulse: 86  Resp: 20  Temp: 99 F (37.2 C)  SpO2: 97%     FHT:139 Lab orders placed from triage:  UA

## 2022-06-01 NOTE — Inpatient Diabetes Management (Signed)
Inpatient Diabetes Program Recommendations  AACE/ADA: New Consensus Statement on Inpatient Glycemic Control (2015)  Target Ranges:  Prepandial:   less than 140 mg/dL      Peak postprandial:   less than 180 mg/dL (1-2 hours)      Critically ill patients:  140 - 180 mg/dL   Lab Results  Component Value Date   GLUCAP 42 (LL) 06/01/2022   HGBA1C 6.4 (H) 06/01/2022    Review of Glycemic Control  Latest Reference Range & Units 06/01/22 10:20 06/01/22 14:40 06/01/22 15:04 06/01/22 15:06 06/01/22 15:44 06/01/22 15:46  Glucose-Capillary 70 - 99 mg/dL 254 (H) 106 (H) 67 (L) 73 39 (LL) 42 (LL)  (LL): Data is critically low (H): Data is abnormally high (L): Data is abnormally low Diabetes history: Type 1 DM Outpatient Diabetes medications: Lantus 25 units QD, Humalog 2-10 units TID Current orders for Inpatient glycemic control: IV insulin Novolog 6 units x 1 Inpatient Diabetes Program Recommendations:    Noted patient admitted. Beta hydroxy slightly elevated but BMP okay.  Noted hypoglycemia following Novolog 6 units x 1. Patient is sensitive to insulin.   Continue IV insulin as directed by Endotool. Spoke with Estill Bamberg, RN to ensure patient remains on Endotool and directions are followed per protocol. Receiving D5% and taking in juice.  Question hyperemesis gravidum?  Follow.   Thanks, Bronson Curb, MSN, RNC-OB Diabetes Coordinator [phone] (8a-5p)
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
## 2022-06-02 ENCOUNTER — Other Ambulatory Visit: Payer: Self-pay

## 2022-06-02 DIAGNOSIS — M545 Low back pain, unspecified: Secondary | ICD-10-CM | POA: Diagnosis not present

## 2022-06-02 DIAGNOSIS — R112 Nausea with vomiting, unspecified: Secondary | ICD-10-CM | POA: Diagnosis not present

## 2022-06-02 DIAGNOSIS — Z3A16 16 weeks gestation of pregnancy: Secondary | ICD-10-CM | POA: Diagnosis not present

## 2022-06-02 DIAGNOSIS — E1010 Type 1 diabetes mellitus with ketoacidosis without coma: Secondary | ICD-10-CM | POA: Diagnosis not present

## 2022-06-02 LAB — GLUCOSE, CAPILLARY
Glucose-Capillary: 101 mg/dL — ABNORMAL HIGH (ref 70–99)
Glucose-Capillary: 103 mg/dL — ABNORMAL HIGH (ref 70–99)
Glucose-Capillary: 116 mg/dL — ABNORMAL HIGH (ref 70–99)
Glucose-Capillary: 118 mg/dL — ABNORMAL HIGH (ref 70–99)
Glucose-Capillary: 123 mg/dL — ABNORMAL HIGH (ref 70–99)
Glucose-Capillary: 125 mg/dL — ABNORMAL HIGH (ref 70–99)
Glucose-Capillary: 128 mg/dL — ABNORMAL HIGH (ref 70–99)
Glucose-Capillary: 134 mg/dL — ABNORMAL HIGH (ref 70–99)
Glucose-Capillary: 140 mg/dL — ABNORMAL HIGH (ref 70–99)
Glucose-Capillary: 146 mg/dL — ABNORMAL HIGH (ref 70–99)
Glucose-Capillary: 149 mg/dL — ABNORMAL HIGH (ref 70–99)
Glucose-Capillary: 151 mg/dL — ABNORMAL HIGH (ref 70–99)
Glucose-Capillary: 153 mg/dL — ABNORMAL HIGH (ref 70–99)
Glucose-Capillary: 155 mg/dL — ABNORMAL HIGH (ref 70–99)
Glucose-Capillary: 156 mg/dL — ABNORMAL HIGH (ref 70–99)
Glucose-Capillary: 161 mg/dL — ABNORMAL HIGH (ref 70–99)
Glucose-Capillary: 168 mg/dL — ABNORMAL HIGH (ref 70–99)
Glucose-Capillary: 168 mg/dL — ABNORMAL HIGH (ref 70–99)
Glucose-Capillary: 172 mg/dL — ABNORMAL HIGH (ref 70–99)
Glucose-Capillary: 180 mg/dL — ABNORMAL HIGH (ref 70–99)
Glucose-Capillary: 74 mg/dL (ref 70–99)
Glucose-Capillary: 80 mg/dL (ref 70–99)
Glucose-Capillary: 93 mg/dL (ref 70–99)
Glucose-Capillary: 94 mg/dL (ref 70–99)

## 2022-06-02 LAB — BASIC METABOLIC PANEL
Anion gap: 12 (ref 5–15)
BUN: <5 mg/dL — ABNORMAL LOW (ref 6–20)
CO2: 21 mmol/L — ABNORMAL LOW (ref 22–32)
Calcium: 9.3 mg/dL (ref 8.9–10.3)
Chloride: 101 mmol/L (ref 98–111)
Creatinine, Ser: 0.75 mg/dL (ref 0.44–1.00)
GFR, Estimated: >60 mL/min (ref >60)
Glucose, Bld: 135 mg/dL — ABNORMAL HIGH (ref 70–99)
Potassium: 3.6 mmol/L (ref 3.5–5.1)
Sodium: 134 mmol/L — ABNORMAL LOW (ref 135–145)

## 2022-06-02 LAB — BETA-HYDROXYBUTYRIC ACID
Beta-Hydroxybutyric Acid: 0.55 mmol/L — ABNORMAL HIGH (ref 0.05–0.27)
Beta-Hydroxybutyric Acid: 0.12 mmol/L (ref 0.05–0.27)

## 2022-06-02 MED ORDER — SODIUM CHLORIDE 0.9 % IV SOLN
8.0000 mg | INTRAVENOUS | Status: DC | PRN
  Filled 2022-06-02: qty 4

## 2022-06-02 MED ORDER — CYCLOBENZAPRINE HCL 10 MG PO TABS
5.0000 mg | ORAL_TABLET | Freq: Three times a day (TID) | ORAL | Status: DC | PRN
  Administered 2022-06-02: 5 mg via ORAL
  Filled 2022-06-02: qty 1

## 2022-06-02 MED ORDER — METOCLOPRAMIDE HCL 5 MG/ML IJ SOLN
10.0000 mg | Freq: Four times a day (QID) | INTRAMUSCULAR | Status: DC
  Administered 2022-06-02 – 2022-06-03 (×6): 10 mg via INTRAVENOUS
  Filled 2022-06-02 (×7): qty 2

## 2022-06-02 NOTE — Plan of Care (Signed)
  Problem: Education: Goal: Ability to describe self-care measures that may prevent or decrease complications (Diabetes Survival Skills Education) will improve Outcome: Progressing Goal: Individualized Educational Video(s) Outcome: Progressing   Problem: Coping: Goal: Ability to adjust to condition or change in health will improve Outcome: Progressing   Problem: Fluid Volume: Goal: Ability to maintain a balanced intake and output will improve Outcome: Progressing   Problem: Health Behavior/Discharge Planning: Goal: Ability to identify and utilize available resources and services will improve Outcome: Progressing Goal: Ability to manage health-related needs will improve Outcome: Progressing   Problem: Metabolic: Goal: Ability to maintain appropriate glucose levels will improve Outcome: Progressing   Problem: Nutritional: Goal: Maintenance of adequate nutrition will improve Outcome: Progressing Goal: Progress toward achieving an optimal weight will improve Outcome: Progressing   Problem: Skin Integrity: Goal: Risk for impaired skin integrity will decrease Outcome: Progressing   Problem: Tissue Perfusion: Goal: Adequacy of tissue perfusion will improve Outcome: Progressing   Problem: Education: Goal: Knowledge of disease or condition will improve Outcome: Progressing Goal: Knowledge of the prescribed therapeutic regimen will improve Outcome: Progressing   Problem: Clinical Measurements: Goal: Complications related to the disease process, condition or treatment will be avoided or minimized Outcome: Progressing   Problem: Education: Goal: Knowledge of General Education information will improve Description: Including pain rating scale, medication(s)/side effects and non-pharmacologic comfort measures Outcome: Progressing   Problem: Health Behavior/Discharge Planning: Goal: Ability to manage health-related needs will improve Outcome: Progressing   Problem: Clinical  Measurements: Goal: Ability to maintain clinical measurements within normal limits will improve Outcome: Progressing Goal: Will remain free from infection Outcome: Progressing Goal: Diagnostic test results will improve Outcome: Progressing Goal: Respiratory complications will improve Outcome: Progressing Goal: Cardiovascular complication will be avoided Outcome: Progressing   Problem: Activity: Goal: Risk for activity intolerance will decrease Outcome: Progressing   Problem: Nutrition: Goal: Adequate nutrition will be maintained Outcome: Progressing   Problem: Coping: Goal: Level of anxiety will decrease Outcome: Progressing   Problem: Elimination: Goal: Will not experience complications related to bowel motility Outcome: Progressing Goal: Will not experience complications related to urinary retention Outcome: Progressing   Problem: Pain Managment: Goal: General experience of comfort will improve Outcome: Progressing   Problem: Safety: Goal: Ability to remain free from injury will improve Outcome: Progressing   Problem: Skin Integrity: Goal: Risk for impaired skin integrity will decrease Outcome: Progressing

## 2022-06-02 NOTE — Progress Notes (Signed)
[facility] COMPREHENSIVE PROGRESS NOTE  Sabrina Sutton is a 26 y.o. G1P0 at 27w1dwho is admitted for DKA in s/o T1DM and N/V of pregnancy.  Estimated Date of Delivery: 11/16/22  Length of Stay:  1 Days. Admitted 06/01/2022  Subjective: Patient feels better compared to yesterday. Notes lower abdominal cramping and discomfort in her back. She still has random episodes of emesis. She is desiring to try PO.   Vitals:  Blood pressure 105/63, pulse 96, temperature 98.1 F (36.7 C), temperature source Oral, resp. rate 16, height '5\' 7"'$  (1.702 m), weight 52.2 kg, last menstrual period 02/09/2022, SpO2 98 %. Physical Examination: CONSTITUTIONAL: Well-developed, well-nourished female in no acute distress.  NEUROLOGIC: Alert and oriented to person, place, and time. No cranial nerve deficit noted. PSYCHIATRIC: Normal mood and affect. Normal behavior. Normal judgment and thought content. CARDIOVASCULAR: Normal heart rate noted, regular rhythm RESPIRATORY: Effort and breath sounds normal, no problems with respiration noted MUSCULOSKELETAL: Normal range of motion. No edema and no tenderness. 2+ distal pulses. ABDOMEN: Soft, nontender, nondistended, gravid, aga   Results for orders placed or performed during the hospital encounter of 06/01/22 (from the past 48 hour(s))  Urinalysis, Routine w reflex microscopic Urine, Clean Catch     Status: Abnormal   Collection Time: 06/01/22  9:42 AM  Result Value Ref Range   Color, Urine YELLOW YELLOW   APPearance HAZY (A) CLEAR   Specific Gravity, Urine 1.025 1.005 - 1.030   pH 6.0 5.0 - 8.0   Glucose, UA >=500 (A) NEGATIVE mg/dL   Hgb urine dipstick NEGATIVE NEGATIVE   Bilirubin Urine NEGATIVE NEGATIVE   Ketones, ur 80 (A) NEGATIVE mg/dL   Protein, ur 30 (A) NEGATIVE mg/dL   Nitrite NEGATIVE NEGATIVE   Leukocytes,Ua NEGATIVE NEGATIVE   RBC / HPF 0-5 0 - 5 RBC/hpf   WBC, UA 0-5 0 - 5 WBC/hpf   Bacteria, UA RARE (A) NONE SEEN   Squamous  Epithelial / LPF 6-10 0 - 5   Mucus PRESENT    Non Squamous Epithelial 0-5 (A) NONE SEEN    Comment: Performed at [facility] [address] [place] Glucose, capillary     Status: Abnormal   Collection Time: 06/01/22 10:20 AM  Result Value Ref Range   Glucose-Capillary 254 (H) 70 - 99 mg/dL    Comment: Glucose reference range applies only to samples taken after fasting for at least 8 hours.  CBC     Status: Abnormal   Collection Time: 06/01/22 11:16 AM  Result Value Ref Range   WBC 11.6 (H) 4.0 - 10.5 K/uL   RBC 3.97 3.87 - 5.11 MIL/uL   Hemoglobin 11.8 (L) 12.0 - 15.0 g/dL   HCT 33.3 (L) 36.0 - 46.0 %   MCV 83.9 80.0 - 100.0 fL   MCH 29.7 26.0 - 34.0 pg   MCHC 35.4 30.0 - 36.0 g/dL   RDW 14.1 11.5 - 15.5 %   Platelets 396 150 - 400 K/uL   nRBC 0.0 0.0 - 0.2 %    Comment: Performed at [facility] [address] [place] [id number] Comprehensive metabolic panel     Status: Abnormal   Collection Time: 06/01/22 11:16 AM  Result Value Ref Range   Sodium 136 135 - 145 mmol/L   Potassium 3.4 (L) 3.5 - 5.1 mmol/L   Chloride 98 98 - 111 mmol/L   CO2 23 22 - 32 mmol/L   Glucose,  Bld 246 (H) 70 - 99 mg/dL    Comment: Glucose reference range applies only to samples taken after fasting for at least 8 hours.   BUN 7 6 - 20 mg/dL   Creatinine, Ser 0.72 0.44 - 1.00 mg/dL   Calcium 9.5 8.9 - 10.3 mg/dL   Total Protein 7.3 6.5 - 8.1 g/dL   Albumin 3.6 3.5 - 5.0 g/dL   AST 18 15 - 41 U/L   ALT 9 0 - 44 U/L   Alkaline Phosphatase 44 38 - 126 U/L   Total Bilirubin 0.7 0.3 - 1.2 mg/dL   GFR, Estimated >60 >60 mL/min    Comment: (NOTE) Calculated using the CKD-EPI Creatinine Equation (2021)    Anion gap 15 5 - 15    Comment: Performed at [facility] [address], [place], [place] [address]  Beta-hydroxybutyric acid     Status: Abnormal   Collection Time: 06/01/22 11:16 AM  Result Value Ref Range   Beta-Hydroxybutyric Acid 1.63 (H)  0.05 - 0.27 mmol/L    Comment: Performed at [facility] [address], [place], [place] [address]  Type and screen [place]     Status: None   Collection Time: 06/01/22  1:40 PM  Result Value Ref Range   ABO/RH(D) O POS    Antibody Screen NEG    Sample Expiration      06/04/2022,2359 Performed at [facility], [place] [address] [place], [place] [address]   Hemoglobin A1c     Status: Abnormal   Collection Time: 06/01/22  1:40 PM  Result Value Ref Range   Hgb A1c MFr Bld 6.4 (H) 4.8 - 5.6 %    Comment: (NOTE) Pre diabetes:          5.7%-6.4%  Diabetes:              >6.4%  Glycemic control for   <7.0% adults with diabetes    Mean Plasma Glucose 136.98 mg/dL    Comment: Performed at [facility] [address] [place], [place] [address]  Glucose, capillary     Status: Abnormal   Collection Time: 06/01/22  2:40 PM  Result Value Ref Range   Glucose-Capillary 106 (H) 70 - 99 mg/dL    Comment: Glucose reference range applies only to samples taken after fasting for at least 8 hours.  Glucose, capillary     Status: Abnormal   Collection Time: 06/01/22  3:04 PM  Result Value Ref Range   Glucose-Capillary 67 (L) 70 - 99 mg/dL    Comment: Glucose reference range applies only to samples taken after fasting for at least 8 hours.  Glucose, capillary     Status: None   Collection Time: 06/01/22  3:06 PM  Result Value Ref Range   Glucose-Capillary 73 70 - 99 mg/dL    Comment: Glucose reference range applies only to samples taken after fasting for at least 8 hours.  Glucose, capillary     Status: Abnormal   Collection Time: 06/01/22  3:44 PM  Result Value Ref Range   Glucose-Capillary 39 (LL) 70 - 99 mg/dL    Comment: Glucose reference range applies only to samples taken after fasting for at least 8 hours.   Comment 1 Repeat Test   Glucose, capillary     Status: Abnormal   Collection Time: 06/01/22  3:46 PM  Result Value Ref Range    Glucose-Capillary 42 (LL) 70 - 99 mg/dL  Comment: Glucose reference range applies only to samples taken after fasting for at least 8 hours.   Comment 1 Call MD NNP PA CNM   Glucose, capillary     Status: None   Collection Time: 06/01/22  4:09 PM  Result Value Ref Range   Glucose-Capillary 96 70 - 99 mg/dL    Comment: Glucose reference range applies only to samples taken after fasting for at least 8 hours.  Basic metabolic panel     Status: Abnormal   Collection Time: 06/01/22  4:51 PM  Result Value Ref Range   Sodium 137 135 - 145 mmol/L   Potassium 3.2 (L) 3.5 - 5.1 mmol/L   Chloride 106 98 - 111 mmol/L   CO2 21 (L) 22 - 32 mmol/L   Glucose, Bld 73 70 - 99 mg/dL    Comment: Glucose reference range applies only to samples taken after fasting for at least 8 hours.   BUN 6 6 - 20 mg/dL   Creatinine, Ser 0.58 0.44 - 1.00 mg/dL   Calcium 8.8 (L) 8.9 - 10.3 mg/dL   GFR, Estimated >60 >60 mL/min    Comment: (NOTE) Calculated using the CKD-EPI Creatinine Equation (2021)    Anion gap 10 5 - 15    Comment: Performed at [facility] [address], [place], [place] [address]  Glucose, capillary     Status: None   Collection Time: 06/01/22  5:16 PM  Result Value Ref Range   Glucose-Capillary 70 70 - 99 mg/dL    Comment: Glucose reference range applies only to samples taken after fasting for at least 8 hours.  Glucose, capillary     Status: None   Collection Time: 06/01/22  6:21 PM  Result Value Ref Range   Glucose-Capillary 73 70 - 99 mg/dL    Comment: Glucose reference range applies only to samples taken after fasting for at least 8 hours.  Glucose, capillary     Status: None   Collection Time: 06/01/22  7:23 PM  Result Value Ref Range   Glucose-Capillary 94 70 - 99 mg/dL    Comment: Glucose reference range applies only to samples taken after fasting for at least 8 hours.  Glucose, capillary     Status: Abnormal   Collection Time: 06/01/22  8:38 PM  Result Value Ref Range    Glucose-Capillary 104 (H) 70 - 99 mg/dL    Comment: Glucose reference range applies only to samples taken after fasting for at least 8 hours.  Beta-hydroxybutyric acid     Status: None   Collection Time: 06/01/22  9:33 PM  Result Value Ref Range   Beta-Hydroxybutyric Acid 0.27 0.05 - 0.27 mmol/L    Comment: Performed at [facility], [place] [address], [place], [place] [address]  Basic metabolic panel     Status: Abnormal   Collection Time: 06/01/22  9:33 PM  Result Value Ref Range   Sodium 135 135 - 145 mmol/L   Potassium 3.5 3.5 - 5.1 mmol/L   Chloride 107 98 - 111 mmol/L   CO2 21 (L) 22 - 32 mmol/L   Glucose, Bld 81 70 - 99 mg/dL    Comment: Glucose reference range applies only to samples taken after fasting for at least 8 hours.   BUN <5 (L) 6 - 20 mg/dL   Creatinine, Ser 0.56 0.44 - 1.00 mg/dL   Calcium 8.9 8.9 - 10.3 mg/dL   GFR, Estimated >60 >60 mL/min    Comment: (NOTE) Calculated  using the CKD-EPI Creatinine Equation (2021)    Anion gap 7 5 - 15    Comment: Performed at [facility], [place] [address], [place], [place] [address]  Glucose, capillary     Status: None   Collection Time: 06/01/22  9:42 PM  Result Value Ref Range   Glucose-Capillary 88 70 - 99 mg/dL    Comment: Glucose reference range applies only to samples taken after fasting for at least 8 hours.  Glucose, capillary     Status: None   Collection Time: 06/01/22 10:38 PM  Result Value Ref Range   Glucose-Capillary 70 70 - 99 mg/dL    Comment: Glucose reference range applies only to samples taken after fasting for at least 8 hours.  Glucose, capillary     Status: Abnormal   Collection Time: 06/01/22 11:39 PM  Result Value Ref Range   Glucose-Capillary 149 (H) 70 - 99 mg/dL    Comment: Glucose reference range applies only to samples taken after fasting for at least 8 hours.  Glucose, capillary     Status: Abnormal   Collection Time: 06/02/22 12:39 AM  Result Value Ref Range   Glucose-Capillary 149  (H) 70 - 99 mg/dL    Comment: Glucose reference range applies only to samples taken after fasting for at least 8 hours.  Glucose, capillary     Status: Abnormal   Collection Time: 06/02/22  1:45 AM  Result Value Ref Range   Glucose-Capillary 153 (H) 70 - 99 mg/dL    Comment: Glucose reference range applies only to samples taken after fasting for at least 8 hours.  Glucose, capillary     Status: Abnormal   Collection Time: 06/02/22  2:43 AM  Result Value Ref Range   Glucose-Capillary 151 (H) 70 - 99 mg/dL    Comment: Glucose reference range applies only to samples taken after fasting for at least 8 hours.  Glucose, capillary     Status: Abnormal   Collection Time: 06/02/22  3:46 AM  Result Value Ref Range   Glucose-Capillary 155 (H) 70 - 99 mg/dL    Comment: Glucose reference range applies only to samples taken after fasting for at least 8 hours.  Glucose, capillary     Status: Abnormal   Collection Time: 06/02/22  4:37 AM  Result Value Ref Range   Glucose-Capillary 172 (H) 70 - 99 mg/dL    Comment: Glucose reference range applies only to samples taken after fasting for at least 8 hours.  Beta-hydroxybutyric acid     Status: Abnormal   Collection Time: 06/02/22  5:27 AM  Result Value Ref Range   Beta-Hydroxybutyric Acid 0.55 (H) 0.05 - 0.27 mmol/L    Comment: Performed at [facility] [address], [place], [place] [address]  Glucose, capillary     Status: Abnormal   Collection Time: 06/02/22  5:47 AM  Result Value Ref Range   Glucose-Capillary 180 (H) 70 - 99 mg/dL    Comment: Glucose reference range applies only to samples taken after fasting for at least 8 hours.  Glucose, capillary     Status: Abnormal   Collection Time: 06/02/22  6:39 AM  Result Value Ref Range   Glucose-Capillary 140 (H) 70 - 99 mg/dL    Comment: Glucose reference range applies only to samples taken after fasting for at least 8 hours.  Glucose, capillary     Status: Abnormal   Collection Time:  06/02/22  7:47 AM  Result Value Ref Range  Glucose-Capillary 101 (H) 70 - 99 mg/dL    Comment: Glucose reference range applies only to samples taken after fasting for at least 8 hours.  Glucose, capillary     Status: Abnormal   Collection Time: 06/02/22  8:48 AM  Result Value Ref Range   Glucose-Capillary 134 (H) 70 - 99 mg/dL    Comment: Glucose reference range applies only to samples taken after fasting for at least 8 hours.  Basic metabolic panel     Status: Abnormal   Collection Time: 06/02/22  9:01 AM  Result Value Ref Range   Sodium 134 (L) 135 - 145 mmol/L   Potassium 3.6 3.5 - 5.1 mmol/L   Chloride 101 98 - 111 mmol/L   CO2 21 (L) 22 - 32 mmol/L   Glucose, Bld 135 (H) 70 - 99 mg/dL    Comment: Glucose reference range applies only to samples taken after fasting for at least 8 hours.   BUN <5 (L) 6 - 20 mg/dL   Creatinine, Ser 0.75 0.44 - 1.00 mg/dL   Calcium 9.3 8.9 - 10.3 mg/dL   GFR, Estimated >60 >60 mL/min    Comment: (NOTE) Calculated using the CKD-EPI Creatinine Equation (2021)    Anion gap 12 5 - 15    Comment: Performed at [facility] [address], [place], [place] [address]  Glucose, capillary     Status: None   Collection Time: 06/02/22  9:57 AM  Result Value Ref Range   Glucose-Capillary 94 70 - 99 mg/dL    Comment: Glucose reference range applies only to samples taken after fasting for at least 8 hours.  Glucose, capillary     Status: Abnormal   Collection Time: 06/02/22 10:42 AM  Result Value Ref Range   Glucose-Capillary 125 (H) 70 - 99 mg/dL    Comment: Glucose reference range applies only to samples taken after fasting for at least 8 hours.  Glucose, capillary     Status: Abnormal   Collection Time: 06/02/22 11:49 AM  Result Value Ref Range   Glucose-Capillary 118 (H) 70 - 99 mg/dL    Comment: Glucose reference range applies only to samples taken after fasting for at least 8 hours.    No results found.  Current scheduled medications   metoCLOPramide (REGLAN) injection  10 mg Intravenous Q6H   polyethylene glycol  17 g Oral Daily   prenatal multivitamin  1 tablet Oral Q1200   scopolamine  1 patch Transdermal Q72H    I have reviewed the patient's current medications.  ASSESSMENT: Principal Problem:   Diabetic ketoacidosis associated with type 1 diabetes mellitus (HCC)   PLAN: DKA - Largely resolved, still some intermittent ketones but she is feeling improved - Not yet able to consistently tolerate PO will hold on transitioning off Endotool until more consistent  diet.  - AM labs wnl except bicarb still a little low. AG is normal. K is 3.6. - BHB was elevated from prior check. Recheck this evening.   T1DM - Was controlled on Lantus 25/10 and humalog 05/02/09 prior to pregnancy but she often did not do her meal times.   N/V - Currently doing Zofran, phenergan, Reglan, and scop patch to see if improvement.  - May eat as tolerated but cautioned on rich/heavy food  Back pain, lower pubic pain - Continue heating pad - Check UCX - Continue tylenol for discomfort. Can have flexeril if able to tolerate PO.    Pregnancy - Continue routine antenatal care -  Intermittent FHT   Radene Gunning, MD, [place] [facility], [place]

## 2022-06-03 DIAGNOSIS — E1010 Type 1 diabetes mellitus with ketoacidosis without coma: Secondary | ICD-10-CM | POA: Diagnosis not present

## 2022-06-03 DIAGNOSIS — R112 Nausea with vomiting, unspecified: Secondary | ICD-10-CM | POA: Diagnosis not present

## 2022-06-03 DIAGNOSIS — M545 Low back pain, unspecified: Secondary | ICD-10-CM | POA: Diagnosis not present

## 2022-06-03 DIAGNOSIS — Z3A16 16 weeks gestation of pregnancy: Secondary | ICD-10-CM | POA: Diagnosis not present

## 2022-06-03 LAB — GLUCOSE, CAPILLARY
Glucose-Capillary: 105 mg/dL — ABNORMAL HIGH (ref 70–99)
Glucose-Capillary: 107 mg/dL — ABNORMAL HIGH (ref 70–99)
Glucose-Capillary: 107 mg/dL — ABNORMAL HIGH (ref 70–99)
Glucose-Capillary: 107 mg/dL — ABNORMAL HIGH (ref 70–99)
Glucose-Capillary: 113 mg/dL — ABNORMAL HIGH (ref 70–99)
Glucose-Capillary: 113 mg/dL — ABNORMAL HIGH (ref 70–99)
Glucose-Capillary: 116 mg/dL — ABNORMAL HIGH (ref 70–99)
Glucose-Capillary: 119 mg/dL — ABNORMAL HIGH (ref 70–99)
Glucose-Capillary: 127 mg/dL — ABNORMAL HIGH (ref 70–99)
Glucose-Capillary: 130 mg/dL — ABNORMAL HIGH (ref 70–99)
Glucose-Capillary: 139 mg/dL — ABNORMAL HIGH (ref 70–99)
Glucose-Capillary: 185 mg/dL — ABNORMAL HIGH (ref 70–99)
Glucose-Capillary: 80 mg/dL (ref 70–99)
Glucose-Capillary: 92 mg/dL (ref 70–99)

## 2022-06-03 LAB — BASIC METABOLIC PANEL
Anion gap: 8 (ref 5–15)
BUN: <5 mg/dL — ABNORMAL LOW (ref 6–20)
CO2: 20 mmol/L — ABNORMAL LOW (ref 22–32)
Calcium: 8.6 mg/dL — ABNORMAL LOW (ref 8.9–10.3)
Chloride: 108 mmol/L (ref 98–111)
Creatinine, Ser: 0.58 mg/dL (ref 0.44–1.00)
GFR, Estimated: >60 mL/min (ref >60)
Glucose, Bld: 120 mg/dL — ABNORMAL HIGH (ref 70–99)
Potassium: 3.1 mmol/L — ABNORMAL LOW (ref 3.5–5.1)
Sodium: 136 mmol/L (ref 135–145)

## 2022-06-03 LAB — CULTURE, OB URINE

## 2022-06-03 LAB — BETA-HYDROXYBUTYRIC ACID: Beta-Hydroxybutyric Acid: 0.08 mmol/L (ref 0.05–0.27)

## 2022-06-03 MED ORDER — POTASSIUM CHLORIDE CRYS ER 20 MEQ PO TBCR
20.0000 meq | EXTENDED_RELEASE_TABLET | Freq: Three times a day (TID) | ORAL | Status: DC
  Administered 2022-06-03 (×2): 20 meq via ORAL
  Filled 2022-06-03 (×3): qty 1

## 2022-06-03 MED ORDER — INSULIN ASPART 100 UNIT/ML IJ SOLN: 0.0000 [IU] | Freq: Three times a day (TID) | INTRAMUSCULAR | Status: DC

## 2022-06-03 MED ORDER — INSULIN ASPART 100 UNIT/ML IJ SOLN: 0.0000 [IU] | Freq: Every day | INTRAMUSCULAR | Status: DC

## 2022-06-03 MED ORDER — INSULIN GLARGINE-YFGN 100 UNIT/ML ~~LOC~~ SOLN
12.0000 [IU] | Freq: Two times a day (BID) | SUBCUTANEOUS | Status: DC
  Administered 2022-06-03 (×2): 12 [IU] via SUBCUTANEOUS
  Filled 2022-06-03 (×3): qty 0.12

## 2022-06-03 NOTE — Progress Notes (Signed)
[facility] COMPREHENSIVE PROGRESS NOTE  Sabrina Sutton is a 26 y.o. G1P0 at 23w2dwho is admitted for DKA in s/o T1DM and N/V of pregnancy.  Estimated Date of Delivery: 11/16/22  Length of Stay:  2 Days. Admitted 06/01/2022  Subjective: Patient feels better overall and last night was able to tolerate roast beef and some soup.  Had emesis this morning and often times.   Vitals:  Blood pressure 101/61, pulse 88, temperature 97.9 F (36.6 C), temperature source Oral, resp. rate 16, height '5\' 7"'$  (1.702 m), weight 52.2 kg, last menstrual period 02/09/2022, SpO2 100 %. Physical Examination: CONSTITUTIONAL: Well-developed, well-nourished female in no acute distress.  NEUROLOGIC: Alert and oriented to person, place, and time. No cranial nerve deficit noted. PSYCHIATRIC: Normal mood and affect. Normal behavior. Normal judgment and thought content. CARDIOVASCULAR: Normal heart rate noted, regular rhythm RESPIRATORY: Effort and breath sounds normal, no problems with respiration noted MUSCULOSKELETAL: Normal range of motion. No edema and no tenderness. 2+ distal pulses. ABDOMEN: Soft, nontender, nondistended, gravid, aga   Results for orders placed or performed during the hospital encounter of 06/01/22 (from the past 48 hour(s))  Glucose, capillary     Status: Abnormal   Collection Time: 06/01/22 10:20 AM  Result Value Ref Range   Glucose-Capillary 254 (H) 70 - 99 mg/dL    Comment: Glucose reference range applies only to samples taken after fasting for at least 8 hours.  CBC     Status: Abnormal   Collection Time: 06/01/22 11:16 AM  Result Value Ref Range   WBC 11.6 (H) 4.0 - 10.5 K/uL   RBC 3.97 3.87 - 5.11 MIL/uL   Hemoglobin 11.8 (L) 12.0 - 15.0 g/dL   HCT 33.3 (L) 36.0 - 46.0 %   MCV 83.9 80.0 - 100.0 fL   MCH 29.7 26.0 - 34.0 pg   MCHC 35.4 30.0 - 36.0 g/dL   RDW 14.1 11.5 - 15.5 %   Platelets 396 150 - 400 K/uL   nRBC 0.0 0.0 - 0.2 %    Comment: Performed at [facility] [id number], [place] [id number] Comprehensive metabolic panel     Status: Abnormal   Collection Time: 06/01/22 11:16 AM  Result Value Ref Range   Sodium 136 135 - 145 mmol/L   Potassium 3.4 (L) 3.5 - 5.1 mmol/L   Chloride 98 98 - 111 mmol/L   CO2 23 22 - 32 mmol/L   Glucose, Bld 246 (H) 70 - 99 mg/dL    Comment: Glucose reference range applies only to samples taken after fasting for at least 8 hours.   BUN 7 6 - 20 mg/dL   Creatinine, Ser 0.72 0.44 - 1.00 mg/dL   Calcium 9.5 8.9 - 10.3 mg/dL   Total Protein 7.3 6.5 - 8.1 g/dL   Albumin 3.6 3.5 - 5.0 g/dL   AST 18 15 - 41 U/L   ALT 9 0 - 44 U/L   Alkaline Phosphatase 44 38 - 126 U/L   Total Bilirubin 0.7 0.3 - 1.2 mg/dL   GFR, Estimated >60 >60 mL/min    Comment: (NOTE) Calculated using the CKD-EPI Creatinine Equation (2021)    Anion gap 15 5 - 15    Comment: Performed at [facility] [place] [address] Beta-hydroxybutyric acid     Status: Abnormal   Collection Time: 06/01/22 11:16 AM  Result Value Ref Range   Beta-Hydroxybutyric Acid 1.63 (H) 0.05 -  0.27 mmol/L    Comment: Performed at [facility], [place] [address] [place], [place] [address]  Type and screen [place]     Status: None   Collection Time: 06/01/22  1:40 PM  Result Value Ref Range   ABO/RH(D) O POS    Antibody Screen NEG    Sample Expiration      06/04/2022,2359 Performed at [facility], [place] [address], [place], [place] [address]   Hemoglobin A1c     Status: Abnormal   Collection Time: 06/01/22  1:40 PM  Result Value Ref Range   Hgb A1c MFr Bld 6.4 (H) 4.8 - 5.6 %    Comment: (NOTE) Pre diabetes:          5.7%-6.4%  Diabetes:              >6.4%  Glycemic control for   <7.0% adults with diabetes    Mean Plasma Glucose 136.98 mg/dL    Comment: Performed at [facility] [address] [place], [place] [address]  Glucose, capillary     Status: Abnormal    Collection Time: 06/01/22  2:40 PM  Result Value Ref Range   Glucose-Capillary 106 (H) 70 - 99 mg/dL    Comment: Glucose reference range applies only to samples taken after fasting for at least 8 hours.  Glucose, capillary     Status: Abnormal   Collection Time: 06/01/22  3:04 PM  Result Value Ref Range   Glucose-Capillary 67 (L) 70 - 99 mg/dL    Comment: Glucose reference range applies only to samples taken after fasting for at least 8 hours.  Glucose, capillary     Status: None   Collection Time: 06/01/22  3:06 PM  Result Value Ref Range   Glucose-Capillary 73 70 - 99 mg/dL    Comment: Glucose reference range applies only to samples taken after fasting for at least 8 hours.  Glucose, capillary     Status: Abnormal   Collection Time: 06/01/22  3:44 PM  Result Value Ref Range   Glucose-Capillary 39 (LL) 70 - 99 mg/dL    Comment: Glucose reference range applies only to samples taken after fasting for at least 8 hours.   Comment 1 Repeat Test   Glucose, capillary     Status: Abnormal   Collection Time: 06/01/22  3:46 PM  Result Value Ref Range   Glucose-Capillary 42 (LL) 70 - 99 mg/dL    Comment: Glucose reference range applies only to samples taken after fasting for at least 8 hours.   Comment 1 Call MD NNP PA CNM   Glucose, capillary     Status: None   Collection Time: 06/01/22  4:09 PM  Result Value Ref Range   Glucose-Capillary 96 70 - 99 mg/dL    Comment: Glucose reference range applies only to samples taken after fasting for at least 8 hours.  Basic metabolic panel     Status: Abnormal   Collection Time: 06/01/22  4:51 PM  Result Value Ref Range   Sodium 137 135 - 145 mmol/L   Potassium 3.2 (L) 3.5 - 5.1 mmol/L   Chloride 106 98 - 111 mmol/L   CO2 21 (L) 22 - 32 mmol/L   Glucose, Bld 73 70 - 99 mg/dL    Comment: Glucose reference range applies only to samples taken after fasting for at least 8 hours.   BUN 6 6 - 20 mg/dL   Creatinine, Ser 0.58 0.44 -  1.00 mg/dL   Calcium  8.8 (L) 8.9 - 10.3 mg/dL   GFR, Estimated >60 >60 mL/min    Comment: (NOTE) Calculated using the CKD-EPI Creatinine Equation (2021)    Anion gap 10 5 - 15    Comment: Performed at [facility], [place] [address], [place], [place] [address]  Glucose, capillary     Status: None   Collection Time: 06/01/22  5:16 PM  Result Value Ref Range   Glucose-Capillary 70 70 - 99 mg/dL    Comment: Glucose reference range applies only to samples taken after fasting for at least 8 hours.  Glucose, capillary     Status: None   Collection Time: 06/01/22  6:21 PM  Result Value Ref Range   Glucose-Capillary 73 70 - 99 mg/dL    Comment: Glucose reference range applies only to samples taken after fasting for at least 8 hours.  Glucose, capillary     Status: None   Collection Time: 06/01/22  7:23 PM  Result Value Ref Range   Glucose-Capillary 94 70 - 99 mg/dL    Comment: Glucose reference range applies only to samples taken after fasting for at least 8 hours.  Glucose, capillary     Status: Abnormal   Collection Time: 06/01/22  8:38 PM  Result Value Ref Range   Glucose-Capillary 104 (H) 70 - 99 mg/dL    Comment: Glucose reference range applies only to samples taken after fasting for at least 8 hours.  Beta-hydroxybutyric acid     Status: None   Collection Time: 06/01/22  9:33 PM  Result Value Ref Range   Beta-Hydroxybutyric Acid 0.27 0.05 - 0.27 mmol/L    Comment: Performed at [facility], [place] [address] [place], [place] [address]  Basic metabolic panel     Status: Abnormal   Collection Time: 06/01/22  9:33 PM  Result Value Ref Range   Sodium 135 135 - 145 mmol/L   Potassium 3.5 3.5 - 5.1 mmol/L   Chloride 107 98 - 111 mmol/L   CO2 21 (L) 22 - 32 mmol/L   Glucose, Bld 81 70 - 99 mg/dL    Comment: Glucose reference range applies only to samples taken after fasting for at least 8 hours.   BUN <5 (L) 6 - 20 mg/dL   Creatinine, Ser 0.56 0.44 - 1.00 mg/dL   Calcium 8.9 8.9 - 10.3 mg/dL    GFR, Estimated >60 >60 mL/min    Comment: (NOTE) Calculated using the CKD-EPI Creatinine Equation (2021)    Anion gap 7 5 - 15    Comment: Performed at [facility] [address], [place], [place] [address]  Glucose, capillary     Status: None   Collection Time: 06/01/22  9:42 PM  Result Value Ref Range   Glucose-Capillary 88 70 - 99 mg/dL    Comment: Glucose reference range applies only to samples taken after fasting for at least 8 hours.  Glucose, capillary     Status: None   Collection Time: 06/01/22 10:38 PM  Result Value Ref Range   Glucose-Capillary 70 70 - 99 mg/dL    Comment: Glucose reference range applies only to samples taken after fasting for at least 8 hours.  Glucose, capillary     Status: Abnormal   Collection Time: 06/01/22 11:39 PM  Result Value Ref Range   Glucose-Capillary 149 (H) 70 - 99 mg/dL    Comment: Glucose reference range applies only to samples taken after fasting for  at least 8 hours.  Glucose, capillary     Status: Abnormal   Collection Time: 06/02/22 12:39 AM  Result Value Ref Range   Glucose-Capillary 149 (H) 70 - 99 mg/dL    Comment: Glucose reference range applies only to samples taken after fasting for at least 8 hours.  Glucose, capillary     Status: Abnormal   Collection Time: 06/02/22  1:45 AM  Result Value Ref Range   Glucose-Capillary 153 (H) 70 - 99 mg/dL    Comment: Glucose reference range applies only to samples taken after fasting for at least 8 hours.  Glucose, capillary     Status: Abnormal   Collection Time: 06/02/22  2:43 AM  Result Value Ref Range   Glucose-Capillary 151 (H) 70 - 99 mg/dL    Comment: Glucose reference range applies only to samples taken after fasting for at least 8 hours.  Glucose, capillary     Status: Abnormal   Collection Time: 06/02/22  3:46 AM  Result Value Ref Range   Glucose-Capillary 155 (H) 70 - 99 mg/dL    Comment: Glucose reference range applies only to samples taken after fasting for at least  8 hours.  Glucose, capillary     Status: Abnormal   Collection Time: 06/02/22  4:37 AM  Result Value Ref Range   Glucose-Capillary 172 (H) 70 - 99 mg/dL    Comment: Glucose reference range applies only to samples taken after fasting for at least 8 hours.  Beta-hydroxybutyric acid     Status: Abnormal   Collection Time: 06/02/22  5:27 AM  Result Value Ref Range   Beta-Hydroxybutyric Acid 0.55 (H) 0.05 - 0.27 mmol/L    Comment: Performed at [facility] [address], [place], [place] [address]  Glucose, capillary     Status: Abnormal   Collection Time: 06/02/22  5:47 AM  Result Value Ref Range   Glucose-Capillary 180 (H) 70 - 99 mg/dL    Comment: Glucose reference range applies only to samples taken after fasting for at least 8 hours.  Glucose, capillary     Status: Abnormal   Collection Time: 06/02/22  6:39 AM  Result Value Ref Range   Glucose-Capillary 140 (H) 70 - 99 mg/dL    Comment: Glucose reference range applies only to samples taken after fasting for at least 8 hours.  Glucose, capillary     Status: Abnormal   Collection Time: 06/02/22  7:47 AM  Result Value Ref Range   Glucose-Capillary 101 (H) 70 - 99 mg/dL    Comment: Glucose reference range applies only to samples taken after fasting for at least 8 hours.  Glucose, capillary     Status: Abnormal   Collection Time: 06/02/22  8:48 AM  Result Value Ref Range   Glucose-Capillary 134 (H) 70 - 99 mg/dL    Comment: Glucose reference range applies only to samples taken after fasting for at least 8 hours.  Basic metabolic panel     Status: Abnormal   Collection Time: 06/02/22  9:01 AM  Result Value Ref Range   Sodium 134 (L) 135 - 145 mmol/L   Potassium 3.6 3.5 - 5.1 mmol/L   Chloride 101 98 - 111 mmol/L   CO2 21 (L) 22 - 32 mmol/L   Glucose, Bld 135 (H) 70 - 99 mg/dL    Comment: Glucose reference range applies only to samples taken after fasting for at least 8 hours.   BUN <5 (L) 6 - 20 mg/dL  Creatinine, Ser 0.75  0.44 - 1.00 mg/dL   Calcium 9.3 8.9 - 10.3 mg/dL   GFR, Estimated >60 >60 mL/min    Comment: (NOTE) Calculated using the CKD-EPI Creatinine Equation (2021)    Anion gap 12 5 - 15    Comment: Performed at [facility] [address] [place], [place] [address]  Glucose, capillary     Status: None   Collection Time: 06/02/22  9:57 AM  Result Value Ref Range   Glucose-Capillary 94 70 - 99 mg/dL    Comment: Glucose reference range applies only to samples taken after fasting for at least 8 hours.  Glucose, capillary     Status: Abnormal   Collection Time: 06/02/22 10:42 AM  Result Value Ref Range   Glucose-Capillary 125 (H) 70 - 99 mg/dL    Comment: Glucose reference range applies only to samples taken after fasting for at least 8 hours.  Glucose, capillary     Status: Abnormal   Collection Time: 06/02/22 11:49 AM  Result Value Ref Range   Glucose-Capillary 118 (H) 70 - 99 mg/dL    Comment: Glucose reference range applies only to samples taken after fasting for at least 8 hours.  Glucose, capillary     Status: Abnormal   Collection Time: 06/02/22 12:49 PM  Result Value Ref Range   Glucose-Capillary 156 (H) 70 - 99 mg/dL    Comment: Glucose reference range applies only to samples taken after fasting for at least 8 hours.  Glucose, capillary     Status: None   Collection Time: 06/02/22  2:10 PM  Result Value Ref Range   Glucose-Capillary 93 70 - 99 mg/dL    Comment: Glucose reference range applies only to samples taken after fasting for at least 8 hours.  Glucose, capillary     Status: None   Collection Time: 06/02/22  3:03 PM  Result Value Ref Range   Glucose-Capillary 74 70 - 99 mg/dL    Comment: Glucose reference range applies only to samples taken after fasting for at least 8 hours.  Glucose, capillary     Status: Abnormal   Collection Time: 06/02/22  4:09 PM  Result Value Ref Range   Glucose-Capillary 161 (H) 70 - 99 mg/dL    Comment: Glucose reference range applies  only to samples taken after fasting for at least 8 hours.  Glucose, capillary     Status: Abnormal   Collection Time: 06/02/22  4:41 PM  Result Value Ref Range   Glucose-Capillary 168 (H) 70 - 99 mg/dL    Comment: Glucose reference range applies only to samples taken after fasting for at least 8 hours.  Glucose, capillary     Status: Abnormal   Collection Time: 06/02/22  5:51 PM  Result Value Ref Range   Glucose-Capillary 116 (H) 70 - 99 mg/dL    Comment: Glucose reference range applies only to samples taken after fasting for at least 8 hours.  Beta-hydroxybutyric acid     Status: None   Collection Time: 06/02/22  6:24 PM  Result Value Ref Range   Beta-Hydroxybutyric Acid 0.12 0.05 - 0.27 mmol/L    Comment: Performed at [facility], [place] [address], [place], [place] [address]  Glucose, capillary     Status: Abnormal   Collection Time: 06/02/22  6:59 PM  Result Value Ref Range   Glucose-Capillary 128 (H) 70 - 99 mg/dL    Comment: Glucose reference range applies only to samples taken after fasting for  at least 8 hours.  Glucose, capillary     Status: Abnormal   Collection Time: 06/02/22  7:30 PM  Result Value Ref Range   Glucose-Capillary 146 (H) 70 - 99 mg/dL    Comment: Glucose reference range applies only to samples taken after fasting for at least 8 hours.  Glucose, capillary     Status: Abnormal   Collection Time: 06/02/22  8:31 PM  Result Value Ref Range   Glucose-Capillary 168 (H) 70 - 99 mg/dL    Comment: Glucose reference range applies only to samples taken after fasting for at least 8 hours.  Glucose, capillary     Status: Abnormal   Collection Time: 06/02/22  9:35 PM  Result Value Ref Range   Glucose-Capillary 103 (H) 70 - 99 mg/dL    Comment: Glucose reference range applies only to samples taken after fasting for at least 8 hours.  Glucose, capillary     Status: None   Collection Time: 06/02/22 10:36 PM  Result Value Ref Range   Glucose-Capillary 80 70 - 99  mg/dL    Comment: Glucose reference range applies only to samples taken after fasting for at least 8 hours.  Glucose, capillary     Status: Abnormal   Collection Time: 06/02/22 11:41 PM  Result Value Ref Range   Glucose-Capillary 123 (H) 70 - 99 mg/dL    Comment: Glucose reference range applies only to samples taken after fasting for at least 8 hours.  Glucose, capillary     Status: Abnormal   Collection Time: 06/03/22 12:49 AM  Result Value Ref Range   Glucose-Capillary 116 (H) 70 - 99 mg/dL    Comment: Glucose reference range applies only to samples taken after fasting for at least 8 hours.  Glucose, capillary     Status: Abnormal   Collection Time: 06/03/22  1:53 AM  Result Value Ref Range   Glucose-Capillary 139 (H) 70 - 99 mg/dL    Comment: Glucose reference range applies only to samples taken after fasting for at least 8 hours.  Glucose, capillary     Status: Abnormal   Collection Time: 06/03/22  2:57 AM  Result Value Ref Range   Glucose-Capillary 107 (H) 70 - 99 mg/dL    Comment: Glucose reference range applies only to samples taken after fasting for at least 8 hours.  Glucose, capillary     Status: Abnormal   Collection Time: 06/03/22  4:03 AM  Result Value Ref Range   Glucose-Capillary 185 (H) 70 - 99 mg/dL    Comment: Glucose reference range applies only to samples taken after fasting for at least 8 hours.  Basic metabolic panel     Status: Abnormal   Collection Time: 06/03/22  4:56 AM  Result Value Ref Range   Sodium 136 135 - 145 mmol/L   Potassium 3.1 (L) 3.5 - 5.1 mmol/L   Chloride 108 98 - 111 mmol/L   CO2 20 (L) 22 - 32 mmol/L   Glucose, Bld 120 (H) 70 - 99 mg/dL    Comment: Glucose reference range applies only to samples taken after fasting for at least 8 hours.   BUN <5 (L) 6 - 20 mg/dL   Creatinine, Ser 0.58 0.44 - 1.00 mg/dL   Calcium 8.6 (L) 8.9 - 10.3 mg/dL   GFR, Estimated >60 >60 mL/min    Comment: (NOTE) Calculated using the CKD-EPI Creatinine  Equation (2021)    Anion gap 8 5 - 15    Comment: Performed at [facility], [place] [address], [place], [place] [address]  Beta-hydroxybutyric acid     Status: None   Collection Time: 06/03/22  4:56 AM  Result Value Ref Range   Beta-Hydroxybutyric Acid 0.08 0.05 - 0.27 mmol/L    Comment: Performed at [facility] [address] [place], [place] [address]  Glucose, capillary     Status: None   Collection Time: 06/03/22  5:11 AM  Result Value Ref Range   Glucose-Capillary 92 70 - 99 mg/dL    Comment: Glucose reference range applies only to samples taken after fasting for at least 8 hours.  Glucose, capillary     Status: None   Collection Time: 06/03/22  5:49 AM  Result Value Ref Range   Glucose-Capillary 80 70 - 99 mg/dL    Comment: Glucose reference range applies only to samples taken after fasting for at least 8 hours.  Glucose, capillary     Status: Abnormal   Collection Time: 06/03/22  6:35 AM  Result Value Ref Range   Glucose-Capillary 105 (H) 70 - 99 mg/dL    Comment: Glucose reference range applies only to samples taken after fasting for at least 8 hours.  Glucose, capillary     Status: Abnormal   Collection Time: 06/03/22  7:39 AM  Result Value Ref Range   Glucose-Capillary 127 (H) 70 - 99 mg/dL    Comment: Glucose reference range applies only to samples taken after fasting for at least 8 hours.  Glucose, capillary     Status: Abnormal   Collection Time: 06/03/22  8:43 AM  Result Value Ref Range   Glucose-Capillary 113 (H) 70 - 99 mg/dL    Comment: Glucose reference range applies only to samples taken after fasting for at least 8 hours.  Glucose, capillary     Status: Abnormal   Collection Time: 06/03/22  9:47 AM  Result Value Ref Range   Glucose-Capillary 107 (H) 70 - 99 mg/dL    Comment: Glucose reference range applies only to samples taken after fasting for at least 8 hours.    No results found.  Current scheduled medications  insulin aspart  0-5 Units  Subcutaneous QHS   insulin aspart  0-9 Units Subcutaneous TID WC   insulin glargine-yfgn  12 Units Subcutaneous BID   metoCLOPramide (REGLAN) injection  10 mg Intravenous Q6H   polyethylene glycol  17 g Oral Daily   potassium chloride  20 mEq Oral TID   prenatal multivitamin  1 tablet Oral Q1200   scopolamine  1 patch Transdermal Q72H    I have reviewed the patient's current medications.  ASSESSMENT: Principal Problem:   Diabetic ketoacidosis associated with type 1 diabetes mellitus (HCC)   PLAN: DKA - Now resolved, still some AM emesis but reviewed goal is to limit not eliminate - Endotool has used 24 units for her over the last 24 hours. She was on 35 units total of basal, but will do 12/12 of semglee initially and then will titrate.  - AM BMP showed low potassium - will give Kdur. Bicarb remains low but not critical.  - BHB was normal. AG is 8.    T1DM - Was controlled on Lantus 25/10 and humalog 05/02/09 prior to pregnancy but she often did not do her meal times. See above.   N/V - Currently doing Zofran, phenergan, Reglan, and scop patch with improvement.  - Tolerating some PO. Will titrate insulin according to what she is eating.  - Message sent to  Levada Dy to arrange Omnipod once she is outpt. Pt already uses dexcom.   Back pain, lower pubic pain - Continue heating pad - Check UCX - pending - Continue tylenol for discomfort. Can have flexeril if able to tolerate PO.    Pregnancy - Continue routine antenatal care - Intermittent FHT - daily fht sufficient.    Radene Gunning, MD, [place] for [facility], [place]

## 2022-06-04 NOTE — Progress Notes (Signed)
Patient expressed to this RN wanting to be discharged from the hospital. Dr. Elonda Husky notified of patient's desire. This RN provided the patient an AMA form and explained what leaving against medical advice means. IVs x2 removed. Pt signed AMA form and departed the unit at 0155.   Geannie Risen, RN

## 2022-06-06 ENCOUNTER — Telehealth: Payer: Self-pay

## 2022-06-06 NOTE — Patient Outreach (Signed)
Transition Care Management Follow-up Telephone Call Date of discharge and from where: 06/04/22 [facility] How have you been since you were released from the hospital? Okay Any questions or concerns? Yes  Items Reviewed: Did the pt receive and understand the discharge instructions provided? Yes  Medications obtained and verified? No  Other? No  Any new allergies since your discharge? No  Dietary orders reviewed? No Do you have support at home? Yes   Home Care and Equipment/Supplies: Were home health services ordered? not applicable If so, what is the name of the agency?   Has the agency set up a time to come to the patient's home? not applicable Were any new equipment or medical supplies ordered?  No What is the name of the medical supply agency?  Were you able to get the supplies/equipment? not applicable Do you have any questions related to the use of the equipment or supplies? No  Functional Questionnaire: (I = Independent and D = Dependent) ADLs: I  Bathing/Dressing- I  Meal Prep- I  Eating- I  Maintaining continence- I  Transferring/Ambulation- I  Managing Meds- I  Follow up appointments reviewed:  PCP Hospital f/u appt confirmed? No  Scheduled to see  Specialist Hospital f/u appt confirmed? No  Scheduled to see  on Are transportation arrangements needed? No  If their condition worsens, is the pt aware to call PCP or go to the Emergency Dept.? Yes Was the patient provided with contact information for the PCP's office or ED? Yes Was to pt encouraged to call back with questions or concerns? Yes  Mickel Fuchs, BSW, [place] Managed Medicaid Team  [phone]

## 2022-06-07 ENCOUNTER — Other Ambulatory Visit: Payer: Self-pay | Admitting: Obstetrics and Gynecology

## 2022-06-07 DIAGNOSIS — E108 Type 1 diabetes mellitus with unspecified complications: Secondary | ICD-10-CM

## 2022-06-07 MED ORDER — OMNIPOD 5 DEXG7G6 INTRO GEN 5 KIT: 1.0000 | PACK | Freq: Once | 1 refills | Status: AC

## 2022-06-07 MED ORDER — OMNIPOD 5 DEXG7G6 PODS GEN 5 MISC: 1.0000 [IU] | 3 refills | Status: DC

## 2022-06-09 ENCOUNTER — Other Ambulatory Visit: Payer: Self-pay | Admitting: Obstetrics and Gynecology

## 2022-06-09 ENCOUNTER — Other Ambulatory Visit: Payer: Self-pay | Admitting: Family Medicine

## 2022-06-09 DIAGNOSIS — O219 Vomiting of pregnancy, unspecified: Secondary | ICD-10-CM

## 2022-06-22 ENCOUNTER — Other Ambulatory Visit: Payer: Self-pay

## 2022-06-22 ENCOUNTER — Ambulatory Visit (INDEPENDENT_AMBULATORY_CARE_PROVIDER_SITE_OTHER): Payer: Medicaid Other | Admitting: Licensed Clinical Social Worker

## 2022-06-22 ENCOUNTER — Encounter: Payer: Self-pay | Admitting: Certified Nurse Midwife

## 2022-06-22 ENCOUNTER — Encounter (HOSPITAL_COMMUNITY): Payer: Self-pay | Admitting: Obstetrics and Gynecology

## 2022-06-22 ENCOUNTER — Inpatient Hospital Stay (HOSPITAL_COMMUNITY)
Admission: AD | Admit: 2022-06-22 | Discharge: 2022-06-28 | DRG: 831 | Disposition: A | Discharge status: Home / Self Care | Payer: Medicaid Other | Attending: Obstetrics & Gynecology | Admitting: Obstetrics & Gynecology

## 2022-06-22 ENCOUNTER — Ambulatory Visit (INDEPENDENT_AMBULATORY_CARE_PROVIDER_SITE_OTHER): Payer: Medicaid Other | Admitting: Certified Nurse Midwife

## 2022-06-22 VITALS — BP 139/90 | HR 134 | Wt 113.2 lb

## 2022-06-22 DIAGNOSIS — M545 Low back pain, unspecified: Secondary | ICD-10-CM | POA: Diagnosis not present

## 2022-06-22 DIAGNOSIS — E8729 Other acidosis: Secondary | ICD-10-CM | POA: Diagnosis not present

## 2022-06-22 DIAGNOSIS — E1010 Type 1 diabetes mellitus with ketoacidosis without coma: Secondary | ICD-10-CM | POA: Diagnosis present

## 2022-06-22 DIAGNOSIS — Z794 Long term (current) use of insulin: Secondary | ICD-10-CM

## 2022-06-22 DIAGNOSIS — O219 Vomiting of pregnancy, unspecified: Secondary | ICD-10-CM

## 2022-06-22 DIAGNOSIS — O0990 Supervision of high risk pregnancy, unspecified, unspecified trimester: Secondary | ICD-10-CM

## 2022-06-22 DIAGNOSIS — Z3A19 19 weeks gestation of pregnancy: Secondary | ICD-10-CM | POA: Diagnosis not present

## 2022-06-22 DIAGNOSIS — F4322 Adjustment disorder with anxiety: Secondary | ICD-10-CM | POA: Diagnosis not present

## 2022-06-22 DIAGNOSIS — Z363 Encounter for antenatal screening for malformations: Secondary | ICD-10-CM | POA: Diagnosis not present

## 2022-06-22 DIAGNOSIS — O210 Mild hyperemesis gravidarum: Secondary | ICD-10-CM | POA: Diagnosis present

## 2022-06-22 DIAGNOSIS — Z87891 Personal history of nicotine dependence: Secondary | ICD-10-CM

## 2022-06-22 DIAGNOSIS — O365920 Maternal care for other known or suspected poor fetal growth, second trimester, not applicable or unspecified: Secondary | ICD-10-CM | POA: Diagnosis not present

## 2022-06-22 DIAGNOSIS — O24012 Pre-existing diabetes mellitus, type 1, in pregnancy, second trimester: Secondary | ICD-10-CM | POA: Diagnosis not present

## 2022-06-22 DIAGNOSIS — E109 Type 1 diabetes mellitus without complications: Secondary | ICD-10-CM

## 2022-06-22 DIAGNOSIS — O0992 Supervision of high risk pregnancy, unspecified, second trimester: Secondary | ICD-10-CM | POA: Diagnosis not present

## 2022-06-22 DIAGNOSIS — E108 Type 1 diabetes mellitus with unspecified complications: Secondary | ICD-10-CM | POA: Diagnosis not present

## 2022-06-22 DIAGNOSIS — F419 Anxiety disorder, unspecified: Secondary | ICD-10-CM

## 2022-06-22 LAB — COMPREHENSIVE METABOLIC PANEL
ALT: 14 U/L (ref 0–44)
AST: 19 U/L (ref 15–41)
Albumin: 3.6 g/dL (ref 3.5–5.0)
Alkaline Phosphatase: 48 U/L (ref 38–126)
Anion gap: 18 — ABNORMAL HIGH (ref 5–15)
BUN: 5 mg/dL — ABNORMAL LOW (ref 6–20)
CO2: 21 mmol/L — ABNORMAL LOW (ref 22–32)
Calcium: 9.4 mg/dL (ref 8.9–10.3)
Chloride: 100 mmol/L (ref 98–111)
Creatinine, Ser: 0.57 mg/dL (ref 0.44–1.00)
GFR, Estimated: >60 mL/min (ref >60)
Glucose, Bld: 73 mg/dL (ref 70–99)
Potassium: 3.8 mmol/L (ref 3.5–5.1)
Sodium: 139 mmol/L (ref 135–145)
Total Bilirubin: 0.4 mg/dL (ref 0.3–1.2)
Total Protein: 7 g/dL (ref 6.5–8.1)

## 2022-06-22 LAB — CBC
HCT: 36.5 % (ref 36.0–46.0)
Hemoglobin: 12.9 g/dL (ref 12.0–15.0)
MCH: 29.7 pg (ref 26.0–34.0)
MCHC: 35.3 g/dL (ref 30.0–36.0)
MCV: 84.1 fL (ref 80.0–100.0)
Platelets: 296 10*3/uL (ref 150–400)
RBC: 4.34 MIL/uL (ref 3.87–5.11)
RDW: 13 % (ref 11.5–15.5)
WBC: 6.8 10*3/uL (ref 4.0–10.5)
nRBC: 0 % (ref 0.0–0.2)

## 2022-06-22 LAB — URINALYSIS, ROUTINE W REFLEX MICROSCOPIC
Bilirubin Urine: NEGATIVE
Glucose, UA: >=500 mg/dL — AB
Hgb urine dipstick: NEGATIVE
Ketones, ur: NEGATIVE mg/dL
Leukocytes,Ua: NEGATIVE
Nitrite: NEGATIVE
Protein, ur: 30 mg/dL — AB
Specific Gravity, Urine: 1.022 (ref 1.005–1.030)
pH: 5 (ref 5.0–8.0)

## 2022-06-22 LAB — GLUCOSE, CAPILLARY
Glucose-Capillary: 75 mg/dL (ref 70–99)
Glucose-Capillary: 113 mg/dL — ABNORMAL HIGH (ref 70–99)
Glucose-Capillary: 48 mg/dL — ABNORMAL LOW (ref 70–99)
Glucose-Capillary: 143 mg/dL — ABNORMAL HIGH (ref 70–99)
Glucose-Capillary: 141 mg/dL — ABNORMAL HIGH (ref 70–99)
Glucose-Capillary: 148 mg/dL — ABNORMAL HIGH (ref 70–99)
Glucose-Capillary: 138 mg/dL — ABNORMAL HIGH (ref 70–99)

## 2022-06-22 LAB — BETA-HYDROXYBUTYRIC ACID: Beta-Hydroxybutyric Acid: 0.08 mmol/L (ref 0.05–0.27)

## 2022-06-22 LAB — LACTIC ACID, PLASMA: Lactic Acid, Venous: 3.4 mmol/L (ref 0.5–1.9)

## 2022-06-22 MED ORDER — SODIUM CHLORIDE 0.9 % IV SOLN
8.0000 mg | Freq: Three times a day (TID) | INTRAVENOUS | Status: DC | PRN
  Administered 2022-06-25: 8 mg via INTRAVENOUS
  Filled 2022-06-22: qty 4

## 2022-06-22 MED ORDER — SODIUM CHLORIDE 0.9 % IV BOLUS
1000.0000 mL | Freq: Once | INTRAVENOUS | Status: AC
  Administered 2022-06-22: 1000 mL via INTRAVENOUS

## 2022-06-22 MED ORDER — PROMETHAZINE HCL 25 MG PO TABS
12.5000 mg | ORAL_TABLET | ORAL | Status: DC | PRN
  Administered 2022-06-24 – 2022-06-25 (×3): 25 mg via ORAL
  Administered 2022-06-27: 12.5 mg via ORAL
  Filled 2022-06-22 (×4): qty 1

## 2022-06-22 MED ORDER — ACETAMINOPHEN 325 MG PO TABS
650.0000 mg | ORAL_TABLET | ORAL | Status: DC | PRN
  Administered 2022-06-24 – 2022-06-27 (×2): 650 mg via ORAL
  Filled 2022-06-22 (×2): qty 2

## 2022-06-22 MED ORDER — SODIUM CHLORIDE 0.9 % IV SOLN
8.0000 mg | Freq: Once | INTRAVENOUS | Status: AC
  Administered 2022-06-22: 8 mg via INTRAVENOUS
  Filled 2022-06-22: qty 4

## 2022-06-22 MED ORDER — ONDANSETRON HCL 4 MG/2ML IJ SOLN
4.0000 mg | Freq: Three times a day (TID) | INTRAMUSCULAR | Status: DC | PRN
  Administered 2022-06-23 – 2022-06-24 (×3): 4 mg via INTRAVENOUS
  Filled 2022-06-22 (×5): qty 2

## 2022-06-22 MED ORDER — ORAL CARE MOUTH RINSE: 15.0000 mL | OROMUCOSAL | Status: DC | PRN

## 2022-06-22 MED ORDER — DEXTROSE IN LACTATED RINGERS 5 % IV SOLN
INTRAVENOUS | Status: DC
  Administered 2022-06-23: 125 mL/h via INTRAVENOUS

## 2022-06-22 MED ORDER — ONDANSETRON 4 MG PO TBDP
4.0000 mg | ORAL_TABLET | Freq: Three times a day (TID) | ORAL | Status: DC | PRN
  Administered 2022-06-24: 8 mg via ORAL
  Filled 2022-06-22 (×2): qty 2

## 2022-06-22 MED ORDER — FAMOTIDINE IN NACL 20-0.9 MG/50ML-% IV SOLN
20.0000 mg | Freq: Two times a day (BID) | INTRAVENOUS | Status: DC
  Administered 2022-06-23 – 2022-06-26 (×3): 20 mg via INTRAVENOUS
  Filled 2022-06-22 (×4): qty 50

## 2022-06-22 MED ORDER — PRENATAL MULTIVITAMIN CH
1.0000 | ORAL_TABLET | Freq: Every day | ORAL | Status: DC
  Administered 2022-06-23 – 2022-06-27 (×4): 1 via ORAL
  Filled 2022-06-22 (×4): qty 1

## 2022-06-22 MED ORDER — LACTATED RINGERS IV SOLN
125.0000 mL/h | INTRAVENOUS | Status: DC
  Administered 2022-06-23: 125 mL/h via INTRAVENOUS

## 2022-06-22 MED ORDER — ZOLPIDEM TARTRATE 5 MG PO TABS
5.0000 mg | ORAL_TABLET | Freq: Every evening | ORAL | Status: DC | PRN
  Administered 2022-06-22: 5 mg via ORAL
  Filled 2022-06-22: qty 1

## 2022-06-22 MED ORDER — HYDROXYZINE HCL 50 MG/ML IM SOLN
50.0000 mg | Freq: Four times a day (QID) | INTRAMUSCULAR | Status: DC | PRN
  Administered 2022-06-24 – 2022-06-26 (×2): 50 mg via INTRAMUSCULAR
  Filled 2022-06-22 (×3): qty 1

## 2022-06-22 MED ORDER — LACTATED RINGERS IV SOLN: INTRAVENOUS | Status: DC

## 2022-06-22 MED ORDER — PROMETHAZINE HCL 25 MG RE SUPP: 12.5000 mg | RECTAL | Status: DC | PRN

## 2022-06-22 MED ORDER — FAMOTIDINE 20 MG PO TABS
20.0000 mg | ORAL_TABLET | Freq: Two times a day (BID) | ORAL | Status: DC
  Administered 2022-06-22 – 2022-06-25 (×5): 20 mg via ORAL
  Filled 2022-06-22 (×6): qty 1

## 2022-06-22 MED ORDER — LACTATED RINGERS IV BOLUS: 1000.0000 mL | INTRAVENOUS | Status: AC

## 2022-06-22 MED ORDER — DEXTROSE 50 % IV SOLN
0.0000 mL | INTRAVENOUS | Status: DC | PRN
  Administered 2022-06-24: 50 mL via INTRAVENOUS
  Administered 2022-06-24 – 2022-06-25 (×2): 25 mL via INTRAVENOUS
  Filled 2022-06-22 (×3): qty 50

## 2022-06-22 MED ORDER — CALCIUM CARBONATE ANTACID 500 MG PO CHEW
2.0000 | CHEWABLE_TABLET | ORAL | Status: DC | PRN
  Administered 2022-06-24: 400 mg via ORAL
  Filled 2022-06-22: qty 2

## 2022-06-22 MED ORDER — INSULIN REGULAR(HUMAN) IN NACL 100-0.9 UT/100ML-% IV SOLN
INTRAVENOUS | Status: DC
  Administered 2022-06-22: 2 [IU]/h via INTRAVENOUS
  Filled 2022-06-22: qty 100

## 2022-06-22 MED ORDER — HYDROXYZINE HCL 50 MG PO TABS
50.0000 mg | ORAL_TABLET | Freq: Four times a day (QID) | ORAL | Status: DC | PRN
  Administered 2022-06-23 – 2022-06-25 (×3): 50 mg via ORAL
  Filled 2022-06-22 (×5): qty 1

## 2022-06-22 MED ORDER — DOCUSATE SODIUM 100 MG PO CAPS
100.0000 mg | ORAL_CAPSULE | Freq: Every day | ORAL | Status: DC
  Administered 2022-06-23 – 2022-06-28 (×5): 100 mg via ORAL
  Filled 2022-06-22 (×5): qty 1

## 2022-06-22 MED ORDER — POTASSIUM CHLORIDE 10 MEQ/100ML IV SOLN
10.0000 meq | INTRAVENOUS | Status: AC
  Administered 2022-06-22 (×2): 10 meq via INTRAVENOUS
  Filled 2022-06-22 (×2): qty 100

## 2022-06-22 NOTE — Progress Notes (Signed)
PRENATAL VISIT NOTE  Subjective:  Sabrina Sutton is a 26 y.o. G1P0 at 84w0dbeing seen today for ongoing prenatal care.  She is currently monitored for the following issues for this high-risk pregnancy and has Type 1 diabetes mellitus with complications ([place]; HSV-2 infection; Anxiety; Supervision of high risk pregnancy, antepartum; and Diabetic ketoacidosis without coma associated with type 1 diabetes mellitus ([place] on their problem list.  Patient reports fatigue, nausea, and vomiting. Patient states she takes zofran, diclegis and reglan daily without improvement. Patient states she was doing well and able to keep food down until 2 days ago. Patient endorses 7 episodes of vomiting today. States her urine yellow. Denies odor. Denies dark "tea colored" urine.Per Patient report, her "blood sugar, continues to increase despite meds".  Initial Sugar this morning was "250, took her insulin and increased to 296 took Humalog and increased to >300 " .  Contractions: Not present. Vag. Bleeding: None.  Movement: Present. Denies leaking of fluid.   The following portions of the patient's history were reviewed and updated as appropriate: allergies, current medications, past family history, past medical history, past social history, past surgical history and problem list.   Objective:   Vitals:   06/22/22 1331  BP: (!) 139/90  Pulse: (!) 134  Weight: 113 lb 3.2 oz (51.3 kg)    Fetal Status: Fetal Heart Rate (bpm): 141   Movement: Present     General:  Alert, oriented and cooperative. Patient is ill-appearing.  Skin: Skin is warm and dry. No rash noted. Cap refill 2-3sec  Cardiovascular: Normal heart rate noted  Respiratory: Normal respiratory effort, no problems with respiration noted  Abdomen: Soft, gravid, appropriate for gestational age.  Pain/Pressure: Present     Pelvic: Cervical exam deferred        Extremities: Normal range of motion.  Edema: None  Mental Status: Normal mood and affect.  Normal behavior. Normal judgment and thought content.   Assessment and Plan:  Pregnancy: G1P0 at 167w0d. Supervision of high risk pregnancy in second trimester - Patient   - AFP, Serum, Open Spina Bifida - [place] Comp + 14 Wk; Future  2. Type 1 diabetes mellitus without complication (HCC) - On Humalog and Lantus.   3. [redacted] weeks gestation of pregnancy - Anatomy Scan ordered  - AFP collected today.   4. Anxiety - Meeting with Pregnancy care Navigator  - [facility]  - Patient endorses being on anti-depressants during COVID but states stopped them prior to getting pregnant.   5. Supervision of high risk pregnancy, antepartum - Type 1 diabetic on daily insulin regimen.   6. Nausea and vomiting during pregnancy - Patient states unable to keep food or meds down.  - ~5% weight loss since MAU visit on 11/6. Patient was previously admitted and tx for DKA but left AMA. Patient states left " d/t having anxiety about being in hospital overnight by herself."   - Recommendation that patient be sent over to MAU for IV fluids. N/V meds and DKA labs.  - MAU providers and charge nurse notified.   Preterm labor symptoms and general obstetric precautions including but not limited to vaginal bleeding, contractions, leaking of fluid and fetal movement were reviewed in detail with the patient. Please refer to After Visit Summary for other counseling recommendations.   Return in about 4 weeks (around 07/20/2022) for HROB.  Future Appointments  Date Time Provider De[place]12/28/2023  2:50 PM BaGriffin Basil  MD [place] None    Ardra Kuznicki (Isaias Sakai) Rollene Rotunda, MSN, [place] for [place]  06/22/22 2:42 PM
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
## 2022-06-22 NOTE — MAU Note (Signed)
Chundra Sauerwein is a 26 y.o. at 69w0dhere in MAU reporting: started vomiting this AM and was sent over from OBGYN to rule out DKA. Took diclegis this AM, states she is out of zofran. Reports 6-7 episodes of emesis. This AM when she woke up CBG was 206 so she took her lantus and then CBG was 295 and then 305 so she took humolog. After the humolog it was 195. Now CBG is 64.  Onset of complaint: today  Pain score: 7/10  Vitals:   06/22/22 1521  BP: 126/76  Pulse: (!) 132  Resp: 18  Temp: 98.8 F (37.1 C)  SpO2: 100%     FTMM:[id number] to maternal emesis  Lab orders placed from triage: UA

## 2022-06-22 NOTE — MAU Note (Signed)
Rechecked CBG and  48. Pt stated she did not feel weak felt like it was coming back up. Notified provider. Recheck CBG in 30 min.
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
## 2022-06-22 NOTE — MAU Note (Signed)
Pt stated she thinks her blood is low  . Checked her omni pod and it was 41. Notified  provider. Gave pt juice and crackers.

## 2022-06-22 NOTE — Progress Notes (Signed)
Pt present for ROB visit. Pt c/o severe nausea and vomiting. Pt states her insurance does not cover Ensure that was prescribed during hospital stay.

## 2022-06-22 NOTE — Discharge Summary (Signed)
Antenatal Physician Discharge Summary  Patient ID: Sabrina Sutton MRN: [id number] DOB/AGE: 12-29-1995 26 y.o.  Admit date: 06/01/2022 Discharge date: 06/22/2022  Admission Diagnoses: Type 1 Diabetes Nausea and Vomiting of pregnancy Diabetic ketoacidosis associated with type 1 diabetes mellitus ([place]) [E10.10]  Discharge Diagnoses:  Same Left Against Medical Advice  Prenatal Procedures: none  Consults: None  Hospital Course:  Sabrina Sutton is a 26 y.o. G1P0 with IUP at 16 weeks admitted for DKA with T1DM in setting of N/V of pregnancy. She was started on antiemetics and treated for her DKA. She was still having N/V at the time she wished to leave the hospital during the night. She left AMA.   Discharge Exam: None - pt left AMA.    Physical Examination: See above.   Significant Diagnostic Studies:  No results found for this or any previous visit (from the past 168 hour(s)). No results found.  Future Appointments  Date Time Provider [place]  06/22/2022  1:30 PM Deloris Ping, CNM [facility] None  06/22/2022  2:00 PM Lynnea Ferrier, LCSW [place] None    Discharge Condition: Stable  There are no questions and answers to display.         Allergies as of 06/04/2022   No Known Allergies      Medication List     ASK your doctor about these medications    Accu-Chek Aviva Plus test strip Generic drug: glucose blood 1 each by Other route See admin instructions. Use as instructed   accu-chek soft touch lancets Use as directed.   Accu-Chek FastClix Lancets Misc Check sugar 10 x daily   acetaminophen 500 MG tablet Commonly known as: TYLENOL Take 500 mg by mouth every 6 (six) hours as needed for mild pain, fever or headache.   Alcohol Pads 70 % Pads Use to wipe skin prior to insulin injections twice daily   B-D UF III MINI PEN NEEDLES 31G X 5 MM Misc Generic drug: Insulin Pen Needle USE TO CHECK BLOOD SUGAR IN THE MORNING BEFORE  EATING, BEFORE EACH MEAL, AND AS NEEDED   Blood Pressure Kit Devi 1 kit by Does not apply route once a week.   Dexcom G6 Receiver Devi USE AS DIRECTED   Dexcom G6 Sensor Misc Inject 1 applicator into the skin as directed. (change sensor every 10 days)   Dexcom G6 Transmitter Misc INJECT 1 DEVICE UNDER THE SKIN AS DIRECTED UP TO 8 TIMES WITH EACH NEW SENSOR   Ensure High Protein Liqd Take 1 each by mouth daily at 6 (six) AM.   Gojji Weight Scale Misc 1 Device by Does not apply route every 30 (thirty) days.   InPen 100-Pink-Novo Devi Generic drug: injection device for insulin Use as directed with Novolog cartridges.   insulin lispro 100 UNIT/ML KwikPen Commonly known as: HumaLOG KwikPen This is being substituted for [employer]. Inject 8 Units into the skin with breakfast, with lunch, and with evening meal. Add 2 units Humalog with evening snacks if at least 2 hours past evening meal.   Lantus SoloStar 100 UNIT/ML Solostar Pen Generic drug: insulin glargine ADMINISTER 25 UNITS UNDER THE SKIN DAILY   metoCLOPramide 10 MG tablet Commonly known as: REGLAN Take 1 tablet (10 mg total) by mouth every 8 (eight) hours as needed.   metoCLOPramide 10 MG tablet Commonly known as: REGLAN Take 1 tablet (10 mg total) by mouth every 6 (six) hours.   ondansetron 4 MG disintegrating tablet Commonly known as: ZOFRAN-ODT Take 1 tablet (4 mg  total) by mouth every 8 (eight) hours as needed for nausea or vomiting.   potassium chloride SA 20 MEQ tablet Commonly known as: KLOR-CON M Take 1 tablet (20 mEq total) by mouth 2 (two) times daily for 3 days.   Prenatal Vitamin 27-0.8 MG Tabs Take 1 tablet by mouth daily.         Total discharge time: 15 minutes   Signed: Radene Gunning M.D. 06/22/2022, 8:27 AM
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
## 2022-06-22 NOTE — Progress Notes (Addendum)
Critical lab value:  Lactic acid 3.4.  Dr Harolyn Rutherford notified at 2130.   Latest Reference Range & Units 06/22/22 19:29  Lactic Acid, Venous 0.5 - 1.9 mmol/L 3.4 (HH)

## 2022-06-23 ENCOUNTER — Inpatient Hospital Stay (HOSPITAL_BASED_OUTPATIENT_CLINIC_OR_DEPARTMENT_OTHER): Payer: Medicaid Other

## 2022-06-23 DIAGNOSIS — Z363 Encounter for antenatal screening for malformations: Secondary | ICD-10-CM

## 2022-06-23 DIAGNOSIS — O24012 Pre-existing diabetes mellitus, type 1, in pregnancy, second trimester: Secondary | ICD-10-CM | POA: Diagnosis not present

## 2022-06-23 DIAGNOSIS — E109 Type 1 diabetes mellitus without complications: Secondary | ICD-10-CM

## 2022-06-23 DIAGNOSIS — O365920 Maternal care for other known or suspected poor fetal growth, second trimester, not applicable or unspecified: Secondary | ICD-10-CM | POA: Diagnosis not present

## 2022-06-23 DIAGNOSIS — Z3A19 19 weeks gestation of pregnancy: Secondary | ICD-10-CM | POA: Diagnosis not present

## 2022-06-23 LAB — RAPID URINE DRUG SCREEN, HOSP PERFORMED
Amphetamines: NOT DETECTED
Barbiturates: NOT DETECTED
Benzodiazepines: NOT DETECTED
Cocaine: NOT DETECTED
Opiates: NOT DETECTED
Tetrahydrocannabinol: POSITIVE — AB

## 2022-06-23 LAB — GLUCOSE, CAPILLARY
Glucose-Capillary: 101 mg/dL — ABNORMAL HIGH (ref 70–99)
Glucose-Capillary: 102 mg/dL — ABNORMAL HIGH (ref 70–99)
Glucose-Capillary: 102 mg/dL — ABNORMAL HIGH (ref 70–99)
Glucose-Capillary: 129 mg/dL — ABNORMAL HIGH (ref 70–99)
Glucose-Capillary: 135 mg/dL — ABNORMAL HIGH (ref 70–99)
Glucose-Capillary: 138 mg/dL — ABNORMAL HIGH (ref 70–99)
Glucose-Capillary: 155 mg/dL — ABNORMAL HIGH (ref 70–99)
Glucose-Capillary: 176 mg/dL — ABNORMAL HIGH (ref 70–99)
Glucose-Capillary: 52 mg/dL — ABNORMAL LOW (ref 70–99)
Glucose-Capillary: 58 mg/dL — ABNORMAL LOW (ref 70–99)
Glucose-Capillary: 74 mg/dL (ref 70–99)
Glucose-Capillary: 75 mg/dL (ref 70–99)
Glucose-Capillary: 77 mg/dL (ref 70–99)
Glucose-Capillary: 78 mg/dL (ref 70–99)
Glucose-Capillary: 79 mg/dL (ref 70–99)
Glucose-Capillary: 90 mg/dL (ref 70–99)
Glucose-Capillary: 91 mg/dL (ref 70–99)
Glucose-Capillary: 94 mg/dL (ref 70–99)
Glucose-Capillary: 94 mg/dL (ref 70–99)

## 2022-06-23 LAB — COMPREHENSIVE METABOLIC PANEL
ALT: 11 U/L (ref 0–44)
AST: 14 U/L — ABNORMAL LOW (ref 15–41)
Albumin: 2.5 g/dL — ABNORMAL LOW (ref 3.5–5.0)
Alkaline Phosphatase: 32 U/L — ABNORMAL LOW (ref 38–126)
Anion gap: 4 — ABNORMAL LOW (ref 5–15)
BUN: <5 mg/dL — ABNORMAL LOW (ref 6–20)
CO2: 24 mmol/L (ref 22–32)
Calcium: 8.5 mg/dL — ABNORMAL LOW (ref 8.9–10.3)
Chloride: 106 mmol/L (ref 98–111)
Creatinine, Ser: 0.52 mg/dL (ref 0.44–1.00)
GFR, Estimated: >60 mL/min (ref >60)
Glucose, Bld: 95 mg/dL (ref 70–99)
Potassium: 3.7 mmol/L (ref 3.5–5.1)
Sodium: 134 mmol/L — ABNORMAL LOW (ref 135–145)
Total Bilirubin: <0.1 mg/dL — ABNORMAL LOW (ref 0.3–1.2)
Total Protein: 5 g/dL — ABNORMAL LOW (ref 6.5–8.1)

## 2022-06-23 LAB — CBC
HCT: 29 % — ABNORMAL LOW (ref 36.0–46.0)
Hemoglobin: 10.1 g/dL — ABNORMAL LOW (ref 12.0–15.0)
MCH: 29.6 pg (ref 26.0–34.0)
MCHC: 34.8 g/dL (ref 30.0–36.0)
MCV: 85 fL (ref 80.0–100.0)
Platelets: 277 10*3/uL (ref 150–400)
RBC: 3.41 MIL/uL — ABNORMAL LOW (ref 3.87–5.11)
RDW: 13.2 % (ref 11.5–15.5)
WBC: 8.9 10*3/uL (ref 4.0–10.5)
nRBC: 0 % (ref 0.0–0.2)

## 2022-06-23 MED ORDER — SODIUM CHLORIDE 0.9 % IV SOLN
12.5000 mg | Freq: Four times a day (QID) | INTRAVENOUS | Status: DC | PRN
  Administered 2022-06-23 – 2022-06-24 (×3): 12.5 mg via INTRAVENOUS
  Filled 2022-06-23 (×4): qty 0.5

## 2022-06-23 MED ORDER — INSULIN ASPART 100 UNIT/ML IJ SOLN
0.0000 [IU] | INTRAMUSCULAR | Status: DC
  Administered 2022-06-23: 3 [IU] via SUBCUTANEOUS
  Administered 2022-06-23: 2 [IU] via SUBCUTANEOUS
  Administered 2022-06-24: 4 [IU] via SUBCUTANEOUS
  Administered 2022-06-24: 2 [IU] via SUBCUTANEOUS

## 2022-06-23 MED ORDER — INSULIN GLARGINE-YFGN 100 UNIT/ML ~~LOC~~ SOLN
14.0000 [IU] | Freq: Every day | SUBCUTANEOUS | Status: DC
  Administered 2022-06-23: 14 [IU] via SUBCUTANEOUS
  Administered 2022-06-24: 10 [IU] via SUBCUTANEOUS
  Filled 2022-06-23 (×2): qty 0.14

## 2022-06-23 NOTE — Plan of Care (Signed)
  Problem: Activity: Goal: Risk for activity intolerance will decrease Outcome: Progressing   Problem: Coping: Goal: Level of anxiety will decrease Outcome: Progressing   Problem: Elimination: Goal: Will not experience complications related to urinary retention Outcome: Completed/Met   Problem: Coping: Goal: Ability to adjust to condition or change in health will improve Outcome: Completed/Met   Problem: Nutritional: Goal: Progress toward achieving an optimal weight will improve Outcome: Completed/Met

## 2022-06-23 NOTE — Inpatient Diabetes Management (Addendum)
Inpatient Diabetes Program Recommendations  AACE/ADA: New Consensus Statement on Inpatient Glycemic Control (2015)  Target Ranges:  Prepandial:   less than 140 mg/dL      Peak postprandial:   less than 180 mg/dL (1-2 hours)      Critically ill patients:  140 - 180 mg/dL   Lab Results  Component Value Date   GLUCAP 102 (H) 06/23/2022   HGBA1C 6.4 (H) 06/01/2022    Review of Glycemic Control  Latest Reference Range & Units 06/23/22 03:23 06/23/22 04:25 06/23/22 05:27 06/23/22 06:27 06/23/22 07:26  Glucose-Capillary 70 - 99 mg/dL 91 78 94 79 102 (H)  (H): Data is abnormally high Diabetes history: Type 1 DM Outpatient Diabetes medications: Omnipod- Lantus 25 units BID, Novolog 8 units TID Current orders for Inpatient glycemic control: IV insulin  Inpatient Diabetes Program Recommendations:    Noted consult. Will plan to see.   When ready to transition consider: -Semglee 12 units two hours prior to discontinuation of IV insulin, then QD to follow -Novolog 0-14 units Q4H  Spoke with patient regarding outpatient diabetes management. Patient has not yet started the Omnipod; provider did not prescribe the insulin vial for loading. Has experienced multiple episodes of nausea and vomiting. Occasionally will miss doses due to hypoglycemia especially in the AM and then will work throughout the day to "chase" the blood sugar down.  Reviewed patient's current A1c of 6.4%. Explained what a A1c is and what it measures and accuracy in pregnancy. Also reviewed goal A1c with patient, importance of good glucose control @ home, and blood sugar goals. Reviewed patho of DM, role of pancreas, impact of controlling DM in pregnancy, hyperemesis gravidum in pregnancy, vascular changes and commorbidities.  Patient reports multiple episodes of hypoglycemia especially in the AM and recently PCP reduced Lantus dose to 10 units QHS and 25 units QA. Continued to have lows and then would swing to significant  hyperglycemia following correction and vomiting. Admits that she has struggled with controlling blood sugars, especially in the second trimester.  At this time, do not recommend Omnipod insulin pump application. With significant emesis and hypoglycemia, unclear of insulin needs, especially given differences with units/hr while on Endotool. Patient can follow up with the OB office regarding insulin pump.    Discussed with Dr Elgie Congo. In agreement.    Thanks, Bronson Curb, MSN, RNC-OB Diabetes Coordinator [phone] (8a-5p)

## 2022-06-23 NOTE — Progress Notes (Signed)
Update on patient's status given to Dr Harolyn Rutherford.  No new orders.

## 2022-06-23 NOTE — Progress Notes (Signed)
Date and time results received: 06/23/22 1557, 1629 (use smartphrase ".now" to insert current time)  Test CBG Critical Value: 48, 58  Name of Provider Notified: Dr. Lynnda Shields   Orders Received? Or Actions Taken?: Follow hypoglycemic protocol: 4 oz juice given PO x 2.  Final CBG 74 at 1656

## 2022-06-24 DIAGNOSIS — Z3A19 19 weeks gestation of pregnancy: Secondary | ICD-10-CM | POA: Diagnosis not present

## 2022-06-24 DIAGNOSIS — E1010 Type 1 diabetes mellitus with ketoacidosis without coma: Secondary | ICD-10-CM

## 2022-06-24 DIAGNOSIS — E8729 Other acidosis: Secondary | ICD-10-CM | POA: Diagnosis not present

## 2022-06-24 DIAGNOSIS — M545 Low back pain, unspecified: Secondary | ICD-10-CM | POA: Diagnosis not present

## 2022-06-24 LAB — AFP, SERUM, OPEN SPINA BIFIDA
AFP MoM: 1.76
AFP Value: 94.6 ng/mL
Gest. Age on Collection Date: 19 weeks
Maternal Age At EDD: 24.9 yr
OSBR Risk 1 IN: 840
Test Results:: NEGATIVE
Weight: 113 [lb_av]

## 2022-06-24 LAB — GLUCOSE, CAPILLARY
Glucose-Capillary: 67 mg/dL — ABNORMAL LOW (ref 70–99)
Glucose-Capillary: 104 mg/dL — ABNORMAL HIGH (ref 70–99)
Glucose-Capillary: 65 mg/dL — ABNORMAL LOW (ref 70–99)
Glucose-Capillary: 148 mg/dL — ABNORMAL HIGH (ref 70–99)
Glucose-Capillary: 55 mg/dL — ABNORMAL LOW (ref 70–99)
Glucose-Capillary: 160 mg/dL — ABNORMAL HIGH (ref 70–99)
Glucose-Capillary: 213 mg/dL — ABNORMAL HIGH (ref 70–99)
Glucose-Capillary: 158 mg/dL — ABNORMAL HIGH (ref 70–99)
Glucose-Capillary: 67 mg/dL — ABNORMAL LOW (ref 70–99)
Glucose-Capillary: 117 mg/dL — ABNORMAL HIGH (ref 70–99)
Glucose-Capillary: 40 mg/dL — CL (ref 70–99)
Glucose-Capillary: 50 mg/dL — ABNORMAL LOW (ref 70–99)
Glucose-Capillary: 47 mg/dL — ABNORMAL LOW (ref 70–99)
Glucose-Capillary: 47 mg/dL — ABNORMAL LOW (ref 70–99)
Glucose-Capillary: 218 mg/dL — ABNORMAL HIGH (ref 70–99)

## 2022-06-24 LAB — COMPREHENSIVE METABOLIC PANEL
ALT: 11 U/L (ref 0–44)
AST: 15 U/L (ref 15–41)
Albumin: 2.9 g/dL — ABNORMAL LOW (ref 3.5–5.0)
Alkaline Phosphatase: 37 U/L — ABNORMAL LOW (ref 38–126)
Anion gap: 10 (ref 5–15)
BUN: <5 mg/dL — ABNORMAL LOW (ref 6–20)
CO2: 18 mmol/L — ABNORMAL LOW (ref 22–32)
Calcium: 8.8 mg/dL — ABNORMAL LOW (ref 8.9–10.3)
Chloride: 103 mmol/L (ref 98–111)
Creatinine, Ser: 0.58 mg/dL (ref 0.44–1.00)
GFR, Estimated: >60 mL/min (ref >60)
Glucose, Bld: 149 mg/dL — ABNORMAL HIGH (ref 70–99)
Potassium: 3.2 mmol/L — ABNORMAL LOW (ref 3.5–5.1)
Sodium: 131 mmol/L — ABNORMAL LOW (ref 135–145)
Total Bilirubin: 0.2 mg/dL — ABNORMAL LOW (ref 0.3–1.2)
Total Protein: 5.9 g/dL — ABNORMAL LOW (ref 6.5–8.1)

## 2022-06-24 MED ORDER — SCOPOLAMINE 1 MG/3DAYS TD PT72
1.0000 | MEDICATED_PATCH | TRANSDERMAL | Status: DC
  Administered 2022-06-24 – 2022-06-27 (×2): 1.5 mg via TRANSDERMAL
  Filled 2022-06-24 (×2): qty 1

## 2022-06-24 MED ORDER — INSULIN GLARGINE-YFGN 100 UNIT/ML ~~LOC~~ SOLN
10.0000 [IU] | Freq: Every day | SUBCUTANEOUS | Status: DC
  Administered 2022-06-25: 10 [IU] via SUBCUTANEOUS
  Filled 2022-06-24: qty 0.1

## 2022-06-24 MED ORDER — DEXTROSE 50 % IV SOLN: 12.5000 g | INTRAVENOUS | Status: AC

## 2022-06-24 MED ORDER — DEXTROSE 50 % IV SOLN
INTRAVENOUS | Status: AC
  Administered 2022-06-24: 12.5 mL
  Filled 2022-06-24: qty 50

## 2022-06-24 MED ORDER — DEXTROSE IN LACTATED RINGERS 5 % IV SOLN: INTRAVENOUS | Status: DC

## 2022-06-24 NOTE — Progress Notes (Signed)
Hypoglycemic Event  CBG: 67 at 0222  Treatment: 4 oz juice/soda  Symptoms: nausea, headache  Follow-up CBG: Time:0244 CBG Result:65  Treatment: 8 oz juice/soda  Symptoms: nausea, headache  Follow-up CBG: Time:0308 CBG Result:104  Possible Reasons for Event: adjustments to insulin regimen  Comments/MD notified: Dr. Damita Dunnings, no new orders    Sabrina Sutton

## 2022-06-24 NOTE — Inpatient Diabetes Management (Signed)
ADA Standards of Care 2023 Diabetes in Pregnancy Target Glucose Ranges:  Fasting: 70 - 95 mg/dL 1 hr postprandial:  110 - '140mg'$ /dL (from first bite of meal) 2 hr postprandial:  100 - 120 mg/dL (from first bit of meal)    Latest Reference Range & Units 06/23/22 11:30 06/23/22 12:35 06/23/22 13:35 06/23/22 15:57 06/23/22 16:29 06/23/22 16:56 06/23/22 20:12 06/23/22 21:19  Glucose-Capillary 70 - 99 mg/dL 155 (H)  IV Insulin Drip 138 (H)  2 units Novolog  129 (H)  14 units Semglee given '@1301'$    IV Insulin Drip Stopped '@1434'$  52 (L) 58 (L) 74 176 (H)  3 units Novolog  135 (H)  (H): Data is abnormally high (L): Data is abnormally low   Latest Reference Range & Units 06/23/22 23:50 06/24/22 02:22 06/24/22 02:44 06/24/22 03:08 06/24/22 04:33  Glucose-Capillary 70 - 99 mg/dL 75 67 (L)  4oz juice given 65 (L)  8oz juice given 104 (H) 148 (H)  2 units Novolog   (L): Data is abnormally low (H): Data is abnormally high   Diabetes history: Type 1 DM  Outpatient DM meds: Omnipod Lantus 25 units BID Novolog 8 units TID  Current orders: Semglee 14 units daily     Novolog 0-14 units Q4 hours   Note Hypoglycemia yesterday at 4pm and Mild Hypoglycemia at 2am today  Recommend decrease of the Semglee to 12 units Daily  Continue Novolog 0-14 units Q4 hours  May only need Daily Lantus at home + Novolog Correction Scale for now  Admits that she has struggled with controlling blood sugars, especially in the second trimester.  At this time, do not recommend Omnipod insulin pump application. With significant emesis and hypoglycemia, unclear of insulin needs, especially given differences with units/hr while on Endotool. Patient can follow up with the OB office regarding insulin pump.    --Will follow patient during hospitalization--  Wyn Quaker RN, MSN, [place] Diabetes Coordinator Inpatient Glycemic Control Team Team Pager: [phone] (8a-5p)

## 2022-06-24 NOTE — Progress Notes (Signed)
[place] NOTE  Sabrina Sutton is a 26 y.o. G1P0 at 59w2dwho is admitted for hyperemesis. Early DKA.  Estimated Date of Delivery: 11/16/22 Fetal presentation is unsure.  Length of Stay:  2 Days. Admitted 06/22/2022  Subjective: Pt feels ok today, she has intermittent nausea, none currently.  She is still having some difficulty keeping food down which affects her blood sugars.  She denies uterine contractions, denies bleeding and leaking of fluid per vagina.  Vitals:  Blood pressure (!) 148/84, pulse 76, temperature (!) 97.3 F (36.3 C), temperature source Axillary, resp. rate 18, height '5\' 7"'$  (1.702 m), weight 51.2 kg, last menstrual period 02/09/2022, SpO2 97 %. Physical Examination: CONSTITUTIONAL: Well-developed, well-nourished female in no acute distress.  HENT:  Normocephalic, atraumatic, External right and left ear normal. Oropharynx is clear and moist EYES: Conjunctivae and EOM are normal.  NECK: Normal range of motion, supple, no masses. SKIN: Skin is warm and dry. No rash noted. Not diaphoretic. No erythema. No pallor. [place] Alert and oriented to person, place, and time. Normal reflexes, muscle tone coordination. No cranial nerve deficit noted. PSYCHIATRIC: Normal mood and affect. Normal behavior. Normal judgment and thought content. CARDIOVASCULAR: Normal heart rate noted , regular rhythm RESPIRATORY: Effort  and breath sounds normal, no problems with respiration noted MUSCULOSKELETAL: Normal range of motion. No edema and no tenderness. ABDOMEN:  Soft, nontender, nondistended, gravid. CERVIX: deferred   Results for orders placed or performed during the hospital encounter of 06/22/22 (from the past 48 hour(s))  CBC     Status: None   Collection Time: 06/22/22  3:56 PM  Result Value Ref Range   WBC 6.8 4.0 - 10.5 K/uL   RBC 4.34 3.87 - 5.11 MIL/uL   Hemoglobin 12.9 12.0 - 15.0 g/dL   HCT 36.5 36.0 - 46.0 %   MCV 84.1 80.0 - 100.0 fL    MCH 29.7 26.0 - 34.0 pg   MCHC 35.3 30.0 - 36.0 g/dL   RDW 13.0 11.5 - 15.5 %   Platelets 296 150 - 400 K/uL   nRBC 0.0 0.0 - 0.2 %    Comment: Performed at [facility] [address] [place] [address] Comprehensive metabolic panel     Status: Abnormal   Collection Time: 06/22/22  3:56 PM  Result Value Ref Range   Sodium 139 135 - 145 mmol/L   Potassium 3.8 3.5 - 5.1 mmol/L   Chloride 100 98 - 111 mmol/L   CO2 21 (L) 22 - 32 mmol/L   Glucose, Bld 73 70 - 99 mg/dL    Comment: Glucose reference range applies only to samples taken after fasting for at least 8 hours.   BUN 5 (L) 6 - 20 mg/dL   Creatinine, Ser 0.57 0.44 - 1.00 mg/dL   Calcium 9.4 8.9 - 10.3 mg/dL   Total Protein 7.0 6.5 - 8.1 g/dL   Albumin 3.6 3.5 - 5.0 g/dL   AST 19 15 - 41 U/L   ALT 14 0 - 44 U/L   Alkaline Phosphatase 48 38 - 126 U/L   Total Bilirubin 0.4 0.3 - 1.2 mg/dL   GFR, Estimated >60 >60 mL/min    Comment: (NOTE) Calculated using the CKD-EPI Creatinine Equation (2021)    Anion gap 18 (H) 5 - 15    Comment: Performed at [address] [place] [address] Beta-hydroxybutyric acid     Status: None   Collection Time:  06/22/22  3:56 PM  Result Value Ref Range   Beta-Hydroxybutyric Acid 0.08 0.05 - 0.27 mmol/L    Comment: Performed at [facility], [place] [address] [place], [place] [address]  Glucose, capillary     Status: None   Collection Time: 06/22/22  3:56 PM  Result Value Ref Range   Glucose-Capillary 75 70 - 99 mg/dL    Comment: Glucose reference range applies only to samples taken after fasting for at least 8 hours.  Urinalysis, Routine w reflex microscopic Urine, Clean Catch     Status: Abnormal   Collection Time: 06/22/22  4:05 PM  Result Value Ref Range   Color, Urine AMBER (A) YELLOW    Comment: BIOCHEMICALS MAY BE AFFECTED BY COLOR   APPearance HAZY (A) CLEAR   Specific Gravity, Urine 1.022 1.005 - 1.030   pH 5.0 5.0 - 8.0   Glucose,  UA >=500 (A) NEGATIVE mg/dL   Hgb urine dipstick NEGATIVE NEGATIVE   Bilirubin Urine NEGATIVE NEGATIVE   Ketones, ur NEGATIVE NEGATIVE mg/dL   Protein, ur 30 (A) NEGATIVE mg/dL   Nitrite NEGATIVE NEGATIVE   Leukocytes,Ua NEGATIVE NEGATIVE   RBC / HPF 0-5 0 - 5 RBC/hpf   WBC, UA 6-10 0 - 5 WBC/hpf   Bacteria, UA RARE (A) NONE SEEN   Squamous Epithelial / LPF 6-10 0 - 5   Mucus PRESENT     Comment: Performed at [facility], [address], [place], [place] [address]  Glucose, capillary     Status: Abnormal   Collection Time: 06/22/22  6:40 PM  Result Value Ref Range   Glucose-Capillary 48 (L) 70 - 99 mg/dL    Comment: Glucose reference range applies only to samples taken after fasting for at least 8 hours.  Glucose, capillary     Status: Abnormal   Collection Time: 06/22/22  7:14 PM  Result Value Ref Range   Glucose-Capillary 113 (H) 70 - 99 mg/dL    Comment: Glucose reference range applies only to samples taken after fasting for at least 8 hours.  Lactic acid, plasma     Status: Abnormal   Collection Time: 06/22/22  7:29 PM  Result Value Ref Range   Lactic Acid, Venous 3.4 (HH) 0.5 - 1.9 mmol/L    Comment: CRITICAL RESULT CALLED TO, READ BACK BY AND VERIFIED WITH D.HILL,RN 2125 06/22/22. L.PAIT Performed at [facility], [place] [address] [place], [place] [address]   Glucose, capillary     Status: Abnormal   Collection Time: 06/22/22  8:17 PM  Result Value Ref Range   Glucose-Capillary 143 (H) 70 - 99 mg/dL    Comment: Glucose reference range applies only to samples taken after fasting for at least 8 hours.   Comment 1 Notify RN   Glucose, capillary     Status: Abnormal   Collection Time: 06/22/22  9:27 PM  Result Value Ref Range   Glucose-Capillary 141 (H) 70 - 99 mg/dL    Comment: Glucose reference range applies only to samples taken after fasting for at least 8 hours.  Glucose, capillary     Status: Abnormal   Collection Time: 06/22/22 10:26 PM  Result Value  Ref Range   Glucose-Capillary 148 (H) 70 - 99 mg/dL    Comment: Glucose reference range applies only to samples taken after fasting for at least 8 hours.  Glucose, capillary     Status: Abnormal   Collection Time: 06/22/22 11:27 PM  Result Value Ref  Range   Glucose-Capillary 138 (H) 70 - 99 mg/dL    Comment: Glucose reference range applies only to samples taken after fasting for at least 8 hours.  Glucose, capillary     Status: Abnormal   Collection Time: 06/23/22 12:27 AM  Result Value Ref Range   Glucose-Capillary 102 (H) 70 - 99 mg/dL    Comment: Glucose reference range applies only to samples taken after fasting for at least 8 hours.   Comment 1 Document in Chart   Glucose, capillary     Status: None   Collection Time: 06/23/22  1:27 AM  Result Value Ref Range   Glucose-Capillary 77 70 - 99 mg/dL    Comment: Glucose reference range applies only to samples taken after fasting for at least 8 hours.  Glucose, capillary     Status: None   Collection Time: 06/23/22  2:27 AM  Result Value Ref Range   Glucose-Capillary 90 70 - 99 mg/dL    Comment: Glucose reference range applies only to samples taken after fasting for at least 8 hours.   Comment 1 Document in Chart   Glucose, capillary     Status: None   Collection Time: 06/23/22  3:23 AM  Result Value Ref Range   Glucose-Capillary 91 70 - 99 mg/dL    Comment: Glucose reference range applies only to samples taken after fasting for at least 8 hours.   Comment 1 Notify RN   Glucose, capillary     Status: None   Collection Time: 06/23/22  4:25 AM  Result Value Ref Range   Glucose-Capillary 78 70 - 99 mg/dL    Comment: Glucose reference range applies only to samples taken after fasting for at least 8 hours.   Comment 1 Document in Chart   Comprehensive metabolic panel     Status: Abnormal   Collection Time: 06/23/22  5:26 AM  Result Value Ref Range   Sodium 134 (L) 135 - 145 mmol/L   Potassium 3.7 3.5 - 5.1 mmol/L   Chloride 106 98  - 111 mmol/L   CO2 24 22 - 32 mmol/L   Glucose, Bld 95 70 - 99 mg/dL    Comment: Glucose reference range applies only to samples taken after fasting for at least 8 hours.   BUN <5 (L) 6 - 20 mg/dL   Creatinine, Ser 0.52 0.44 - 1.00 mg/dL   Calcium 8.5 (L) 8.9 - 10.3 mg/dL   Total Protein 5.0 (L) 6.5 - 8.1 g/dL   Albumin 2.5 (L) 3.5 - 5.0 g/dL   AST 14 (L) 15 - 41 U/L   ALT 11 0 - 44 U/L   Alkaline Phosphatase 32 (L) 38 - 126 U/L   Total Bilirubin <0.1 (L) 0.3 - 1.2 mg/dL   GFR, Estimated >60 >60 mL/min    Comment: (NOTE) Calculated using the CKD-EPI Creatinine Equation (2021)    Anion gap 4 (L) 5 - 15    Comment: Performed at [facility], [place] [address], [place],  [address]  CBC     Status: Abnormal   Collection Time: 06/23/22  5:26 AM  Result Value Ref Range   WBC 8.9 4.0 - 10.5 K/uL   RBC 3.41 (L) 3.87 - 5.11 MIL/uL   Hemoglobin 10.1 (L) 12.0 - 15.0 g/dL   HCT 29.0 (L) 36.0 - 46.0 %   MCV 85.0 80.0 - 100.0 fL   MCH 29.6 26.0 - 34.0 pg   MCHC 34.8 30.0 - 36.0 g/dL  RDW 13.2 11.5 - 15.5 %   Platelets 277 150 - 400 K/uL   nRBC 0.0 0.0 - 0.2 %    Comment: Performed at [facility], [place] [address], [place], [place] [address]  Glucose, capillary     Status: None   Collection Time: 06/23/22  5:27 AM  Result Value Ref Range   Glucose-Capillary 94 70 - 99 mg/dL    Comment: Glucose reference range applies only to samples taken after fasting for at least 8 hours.   Comment 1 Document in Chart   Rapid urine drug screen (hospital performed) Not at [facility]     Status: Abnormal   Collection Time: 06/23/22  5:43 AM  Result Value Ref Range   Opiates NONE DETECTED NONE DETECTED   Cocaine NONE DETECTED NONE DETECTED   Benzodiazepines NONE DETECTED NONE DETECTED   Amphetamines NONE DETECTED NONE DETECTED   Tetrahydrocannabinol POSITIVE (A) NONE DETECTED   Barbiturates NONE DETECTED NONE DETECTED    Comment: (NOTE) DRUG SCREEN FOR MEDICAL PURPOSES ONLY.  IF  CONFIRMATION IS NEEDED FOR ANY PURPOSE, NOTIFY LAB WITHIN 5 DAYS.  LOWEST DETECTABLE LIMITS FOR URINE DRUG SCREEN Drug Class                     Cutoff (ng/mL) Amphetamine and metabolites    1000 Barbiturate and metabolites    200 Benzodiazepine                 200 Opiates and metabolites        300 Cocaine and metabolites        300 THC                            50 Performed at [facility], [place] [address] [place], [place] [address]   Glucose, capillary     Status: None   Collection Time: 06/23/22  6:27 AM  Result Value Ref Range   Glucose-Capillary 79 70 - 99 mg/dL    Comment: Glucose reference range applies only to samples taken after fasting for at least 8 hours.   Comment 1 Document in Chart   Glucose, capillary     Status: Abnormal   Collection Time: 06/23/22  7:26 AM  Result Value Ref Range   Glucose-Capillary 102 (H) 70 - 99 mg/dL    Comment: Glucose reference range applies only to samples taken after fasting for at least 8 hours.   Comment 1 Document in Chart   Glucose, capillary     Status: None   Collection Time: 06/23/22  8:34 AM  Result Value Ref Range   Glucose-Capillary 94 70 - 99 mg/dL    Comment: Glucose reference range applies only to samples taken after fasting for at least 8 hours.  Glucose, capillary     Status: Abnormal   Collection Time: 06/23/22  9:30 AM  Result Value Ref Range   Glucose-Capillary 101 (H) 70 - 99 mg/dL    Comment: Glucose reference range applies only to samples taken after fasting for at least 8 hours.  Glucose, capillary     Status: Abnormal   Collection Time: 06/23/22 11:30 AM  Result Value Ref Range   Glucose-Capillary 155 (H) 70 - 99 mg/dL    Comment: Glucose reference range applies only to samples taken after fasting for at least 8 hours.  Glucose, capillary     Status: Abnormal   Collection Time: 06/23/22 12:35 PM  Result Value Ref Range   Glucose-Capillary 138 (H) 70 - 99 mg/dL    Comment: Glucose reference range  applies only to samples taken after fasting for at least 8 hours.  Glucose, capillary     Status: Abnormal   Collection Time: 06/23/22  1:35 PM  Result Value Ref Range   Glucose-Capillary 129 (H) 70 - 99 mg/dL    Comment: Glucose reference range applies only to samples taken after fasting for at least 8 hours.  Glucose, capillary     Status: Abnormal   Collection Time: 06/23/22  3:57 PM  Result Value Ref Range   Glucose-Capillary 52 (L) 70 - 99 mg/dL    Comment: Glucose reference range applies only to samples taken after fasting for at least 8 hours.  Glucose, capillary     Status: Abnormal   Collection Time: 06/23/22  4:29 PM  Result Value Ref Range   Glucose-Capillary 58 (L) 70 - 99 mg/dL    Comment: Glucose reference range applies only to samples taken after fasting for at least 8 hours.  Glucose, capillary     Status: None   Collection Time: 06/23/22  4:56 PM  Result Value Ref Range   Glucose-Capillary 74 70 - 99 mg/dL    Comment: Glucose reference range applies only to samples taken after fasting for at least 8 hours.  Glucose, capillary     Status: Abnormal   Collection Time: 06/23/22  8:12 PM  Result Value Ref Range   Glucose-Capillary 176 (H) 70 - 99 mg/dL    Comment: Glucose reference range applies only to samples taken after fasting for at least 8 hours.   Comment 1 Notify RN   Glucose, capillary     Status: Abnormal   Collection Time: 06/23/22  9:19 PM  Result Value Ref Range   Glucose-Capillary 135 (H) 70 - 99 mg/dL    Comment: Glucose reference range applies only to samples taken after fasting for at least 8 hours.  Glucose, capillary     Status: None   Collection Time: 06/23/22 11:50 PM  Result Value Ref Range   Glucose-Capillary 75 70 - 99 mg/dL    Comment: Glucose reference range applies only to samples taken after fasting for at least 8 hours.  Glucose, capillary     Status: Abnormal   Collection Time: 06/24/22  2:22 AM  Result Value Ref Range    Glucose-Capillary 67 (L) 70 - 99 mg/dL    Comment: Glucose reference range applies only to samples taken after fasting for at least 8 hours.  Glucose, capillary     Status: Abnormal   Collection Time: 06/24/22  2:44 AM  Result Value Ref Range   Glucose-Capillary 65 (L) 70 - 99 mg/dL    Comment: Glucose reference range applies only to samples taken after fasting for at least 8 hours.  Glucose, capillary     Status: Abnormal   Collection Time: 06/24/22  3:08 AM  Result Value Ref Range   Glucose-Capillary 104 (H) 70 - 99 mg/dL    Comment: Glucose reference range applies only to samples taken after fasting for at least 8 hours.  Comprehensive metabolic panel     Status: Abnormal   Collection Time: 06/24/22  4:27 AM  Result Value Ref Range   Sodium 131 (L) 135 - 145 mmol/L   Potassium 3.2 (L) 3.5 - 5.1 mmol/L   Chloride 103 98 - 111 mmol/L   CO2 18 (L) 22 - 32 mmol/L   Glucose, Bld  149 (H) 70 - 99 mg/dL    Comment: Glucose reference range applies only to samples taken after fasting for at least 8 hours.   BUN <5 (L) 6 - 20 mg/dL   Creatinine, Ser 0.58 0.44 - 1.00 mg/dL   Calcium 8.8 (L) 8.9 - 10.3 mg/dL   Total Protein 5.9 (L) 6.5 - 8.1 g/dL   Albumin 2.9 (L) 3.5 - 5.0 g/dL   AST 15 15 - 41 U/L   ALT 11 0 - 44 U/L   Alkaline Phosphatase 37 (L) 38 - 126 U/L   Total Bilirubin 0.2 (L) 0.3 - 1.2 mg/dL   GFR, Estimated >60 >60 mL/min    Comment: (NOTE) Calculated using the CKD-EPI Creatinine Equation (2021)    Anion gap 10 5 - 15    Comment: Performed at [facility], [place] [address] [place], [place] [address]  Glucose, capillary     Status: Abnormal   Collection Time: 06/24/22  4:33 AM  Result Value Ref Range   Glucose-Capillary 148 (H) 70 - 99 mg/dL    Comment: Glucose reference range applies only to samples taken after fasting for at least 8 hours.  Glucose, capillary     Status: Abnormal   Collection Time: 06/24/22  8:56 AM  Result Value Ref Range   Glucose-Capillary 160  (H) 70 - 99 mg/dL    Comment: Glucose reference range applies only to samples taken after fasting for at least 8 hours.    I have reviewed the patient's current medications.  ASSESSMENT: Principal Problem:   Increased anion gap metabolic acidosis Active Problems:   Type 1 diabetes mellitus with complications (HCC)   PLAN: Discussed pt with diabetic specialist Will decrease semglee to 10 units daily.  Will monitor for hypoglycemic events. No anion gap today. Will add scopolamine patch to aid in nausea control. Reassess blood sugars in AM Potential discharge 06/25/22   Continue routine antenatal care.   Lynnda Shields, MD [place] Attending, [facility] 06/24/2022 11:10 AM

## 2022-06-25 DIAGNOSIS — E8729 Other acidosis: Secondary | ICD-10-CM | POA: Diagnosis not present

## 2022-06-25 DIAGNOSIS — M545 Low back pain, unspecified: Secondary | ICD-10-CM | POA: Diagnosis not present

## 2022-06-25 DIAGNOSIS — Z3A19 19 weeks gestation of pregnancy: Secondary | ICD-10-CM | POA: Diagnosis not present

## 2022-06-25 DIAGNOSIS — E1010 Type 1 diabetes mellitus with ketoacidosis without coma: Secondary | ICD-10-CM | POA: Diagnosis not present

## 2022-06-25 LAB — GLUCOSE, CAPILLARY
Glucose-Capillary: 123 mg/dL — ABNORMAL HIGH (ref 70–99)
Glucose-Capillary: 174 mg/dL — ABNORMAL HIGH (ref 70–99)
Glucose-Capillary: 199 mg/dL — ABNORMAL HIGH (ref 70–99)
Glucose-Capillary: 202 mg/dL — ABNORMAL HIGH (ref 70–99)
Glucose-Capillary: 220 mg/dL — ABNORMAL HIGH (ref 70–99)
Glucose-Capillary: 47 mg/dL — ABNORMAL LOW (ref 70–99)
Glucose-Capillary: 54 mg/dL — ABNORMAL LOW (ref 70–99)
Glucose-Capillary: 63 mg/dL — ABNORMAL LOW (ref 70–99)
Glucose-Capillary: 89 mg/dL (ref 70–99)

## 2022-06-25 MED ORDER — LACTATED RINGERS IV SOLN: INTRAVENOUS | Status: DC

## 2022-06-25 MED ORDER — LACTATED RINGERS IV BOLUS
500.0000 mL | Freq: Once | INTRAVENOUS | Status: AC
  Administered 2022-06-25: 500 mL via INTRAVENOUS

## 2022-06-25 MED ORDER — ONDANSETRON 4 MG PO TBDP: 8.0000 mg | ORAL_TABLET | Freq: Three times a day (TID) | ORAL | Status: DC

## 2022-06-25 MED ORDER — INSULIN GLARGINE-YFGN 100 UNIT/ML ~~LOC~~ SOLN
5.0000 [IU] | Freq: Every day | SUBCUTANEOUS | Status: DC
  Administered 2022-06-26 – 2022-06-27 (×2): 5 [IU] via SUBCUTANEOUS
  Filled 2022-06-25 (×2): qty 0.05

## 2022-06-25 MED ORDER — ONDANSETRON HCL 4 MG/2ML IJ SOLN
4.0000 mg | Freq: Three times a day (TID) | INTRAMUSCULAR | Status: DC
  Administered 2022-06-25 – 2022-06-26 (×2): 4 mg via INTRAVENOUS
  Filled 2022-06-25 (×2): qty 2

## 2022-06-25 MED ORDER — SODIUM CHLORIDE 0.9 % IV SOLN
12.5000 mg | Freq: Four times a day (QID) | INTRAVENOUS | Status: DC
  Administered 2022-06-25 – 2022-06-26 (×5): 12.5 mg via INTRAVENOUS
  Filled 2022-06-25: qty 0.5
  Filled 2022-06-25 (×3): qty 12.5
  Filled 2022-06-25 (×2): qty 0.5
  Filled 2022-06-25: qty 12.5
  Filled 2022-06-25 (×2): qty 0.5

## 2022-06-25 MED ORDER — SODIUM CHLORIDE 0.9 % IV SOLN
8.0000 mg | Freq: Three times a day (TID) | INTRAVENOUS | Status: DC
  Administered 2022-06-25 – 2022-06-26 (×2): 8 mg via INTRAVENOUS
  Filled 2022-06-25 (×6): qty 4

## 2022-06-25 MED ORDER — INSULIN ASPART 100 UNIT/ML IJ SOLN
0.0000 [IU] | INTRAMUSCULAR | Status: DC
  Administered 2022-06-25: 2 [IU] via SUBCUTANEOUS
  Administered 2022-06-26 – 2022-06-27 (×3): 1 [IU] via SUBCUTANEOUS
  Administered 2022-06-27: 2 [IU] via SUBCUTANEOUS
  Administered 2022-06-27: 1 [IU] via SUBCUTANEOUS
  Administered 2022-06-28 (×2): 2 [IU] via SUBCUTANEOUS

## 2022-06-25 NOTE — Inpatient Diabetes Management (Addendum)
ADA Standards of Care 2023 Diabetes in Pregnancy Target Glucose Ranges:  Fasting: 70 - 95 mg/dL 1 hr postprandial:  110 - '140mg'$ /dL (from first bite of meal) 2 hr postprandial:  100 - 120 mg/dL (from first bit of meal)    Latest Reference Range & Units 06/23/22 23:50 06/24/22 02:22 06/24/22 02:44 06/24/22 03:08 06/24/22 04:33 06/24/22 08:13 06/24/22 08:56 06/24/22 12:10  Glucose-Capillary 70 - 99 mg/dL 75 67 (L)  4oz juice given  65 (L)  8oz juice given  104 (H) 148 (H)  2 units Novolog  55 (L)  12.5 ml D50% '@0838'$  160 (H) 213 (H)  4 units Novolog  10 units Semglee '@1103'$   (L): Data is abnormally low (H): Data is abnormally high  Latest Reference Range & Units 06/24/22 13:56 06/24/22 16:06 06/24/22 16:31 06/24/22 16:53 06/24/22 17:27 06/24/22 20:03 06/24/22 20:20 06/24/22 21:04  Glucose-Capillary 70 - 99 mg/dL 158 (H) 67 (L) 50 (L) 40 (LL)  25 ml D50% '@1656'$  117 (H) 47 (L)  50 ml D50% '@2025'$  47 (L) 218 (H)  (LL): Data is critically low (H): Data is abnormally high (L): Data is abnormally low  Latest Reference Range & Units 06/25/22 00:51 06/25/22 04:48 06/25/22 08:12  Glucose-Capillary 70 - 99 mg/dL 220 (H) 199 (H) 202 (H)  (H): Data is abnormally high   Diabetes history: Type 1 DM   Outpatient DM meds: Omnipod Lantus 25 units BID Novolog 8 units TID   Current orders: Semglee 10 units daily                                Hypoglycemia yesterday at 2am, again at 8am, again at 4pm, and finally at 8pm  Note pt treated with D50%--Do not see the oral treatments given (if any given--none were charted later in the day)  Semglee Insulin was decreased to 10 units Daily yesterday 12/02--Novolog Correction scale was stopped last night  I question if the CBGs >200 at 8pm, 1am, and 4am are due to over-treatment of the Hypoglycemia?    MD- Given the lability of her CBGs all day yesterday, question if we should keep her today and lower the dose of Semglee a bit more and add back  a more sensitive, less aggressive Novolog Correction scale Q4 hours?  Recommend:   1. Reduce Semglee to 8 units Daily  2. Add back Novolog but at lower scale:  Novolog 0-8 units Q4 hours 70-120- 0 units 121-160- 1 unit 161-200- 2 units 201-240- 3 units 241-280- 4 units 281-320- 5 units 321-360- 6 units 361-400- 7 units >400- 8 units and call MD    --Will follow patient during hospitalization--  Wyn Quaker RN, MSN, [place] Diabetes Coordinator Inpatient Glycemic Control Team Team Pager: [phone] (8a-5p)

## 2022-06-25 NOTE — Plan of Care (Signed)
  Problem: Education: Goal: Knowledge of disease or condition will improve Outcome: Not Progressing Goal: Knowledge of the prescribed therapeutic regimen will improve Outcome: Not Progressing Goal: Individualized Educational Video(s) Outcome: Not Progressing   Problem: Clinical Measurements: Goal: Complications related to the disease process, condition or treatment will be avoided or minimized Outcome: Not Progressing   Problem: Education: Goal: Knowledge of disease or condition will improve Outcome: Not Progressing Goal: Knowledge of the prescribed therapeutic regimen will improve Outcome: Not Progressing   Problem: Bowel/Gastric: Goal: Occurences of nausea and/or vomiting will decrease Outcome: Not Progressing   Problem: Fluid Volume: Goal: Maintenance of adequate hydration will improve Outcome: Not Progressing   Problem: Nutritional: Goal: Achievement of adequate weight for body size and type will improve Outcome: Not Progressing   Problem: Education: Goal: Knowledge of General Education information will improve Description: Including pain rating scale, medication(s)/side effects and non-pharmacologic comfort measures Outcome: Not Progressing   Problem: Health Behavior/Discharge Planning: Goal: Ability to manage health-related needs will improve Outcome: Not Progressing   Problem: Clinical Measurements: Goal: Ability to maintain clinical measurements within normal limits will improve Outcome: Not Progressing Goal: Will remain free from infection Outcome: Not Progressing Goal: Diagnostic test results will improve Outcome: Not Progressing Goal: Respiratory complications will improve Outcome: Not Progressing Goal: Cardiovascular complication will be avoided Outcome: Not Progressing   Problem: Activity: Goal: Risk for activity intolerance will decrease Outcome: Not Progressing   Problem: Nutrition: Goal: Adequate nutrition will be maintained Outcome: Not  Progressing   Problem: Coping: Goal: Level of anxiety will decrease Outcome: Not Progressing   Problem: Elimination: Goal: Will not experience complications related to bowel motility Outcome: Not Progressing   Problem: Pain Managment: Goal: General experience of comfort will improve Outcome: Not Progressing   Problem: Safety: Goal: Ability to remain free from injury will improve Outcome: Not Progressing   Problem: Skin Integrity: Goal: Risk for impaired skin integrity will decrease Outcome: Not Progressing   Problem: Education: Goal: Ability to describe self-care measures that may prevent or decrease complications (Diabetes Survival Skills Education) will improve Outcome: Not Progressing Goal: Individualized Educational Video(s) Outcome: Not Progressing   Problem: Fluid Volume: Goal: Ability to maintain a balanced intake and output will improve Outcome: Not Progressing   Problem: Health Behavior/Discharge Planning: Goal: Ability to identify and utilize available resources and services will improve Outcome: Not Progressing Goal: Ability to manage health-related needs will improve Outcome: Not Progressing   Problem: Metabolic: Goal: Ability to maintain appropriate glucose levels will improve Outcome: Not Progressing   Problem: Nutritional: Goal: Maintenance of adequate nutrition will improve Outcome: Not Progressing   Problem: Skin Integrity: Goal: Risk for impaired skin integrity will decrease Outcome: Not Progressing   Problem: Tissue Perfusion: Goal: Adequacy of tissue perfusion will improve Outcome: Not Progressing

## 2022-06-25 NOTE — Progress Notes (Signed)
[place] NOTE  Sabrina Sutton is a 26 y.o. G1P0 at 25w3dwho is admitted for hyperemesis, possibly THC mediated,  and early DKA which has resolved.  Estimated Date of Delivery: 11/16/22 Fetal presentation is unsure.  Length of Stay:  3 Days. Admitted 06/22/2022  Subjective: Pt has had intermittent bouts of nausea and still some difficulty keeping food down.  Currently she is requesting tea and a light meal.  Overnight she has had two incidents of hypoglycemia an was given juice.  BS has increased and is now mildly hyperglycemic.  She denies uterine contractions, denies bleeding and leaking of fluid per vagina.  Vitals:  Blood pressure 127/79, pulse 92, temperature 97.9 F (36.6 C), temperature source Oral, resp. rate 16, height '5\' 7"'$  (1.702 m), weight 50.6 kg, last menstrual period 02/09/2022, SpO2 100 %. Physical Examination: CONSTITUTIONAL: Well-developed, well-nourished female in no acute distress.  HENT:  Normocephalic, atraumatic, External right and left ear normal. Oropharynx is clear and moist EYES: Conjunctivae and EOM are normal.  NECK: Normal range of motion, supple, no masses. SKIN: Skin is warm and dry. No rash noted. Not diaphoretic. No erythema. No pallor. [place] Alert and oriented to person, place, and time. Normal reflexes, muscle tone coordination. No cranial nerve deficit noted. PSYCHIATRIC: Normal mood and affect. Normal behavior. Normal judgment and thought content. CARDIOVASCULAR: Normal heart rate noted, regular rhythm RESPIRATORY: Effort and breath sounds normal, no problems with respiration noted MUSCULOSKELETAL: Normal range of motion. No edema and no tenderness. ABDOMEN: Soft, nontender, nondistended, gravid. CERVIX: deferred   Results for orders placed or performed during the hospital encounter of 06/22/22 (from the past 48 hour(s))  Glucose, capillary     Status: Abnormal   Collection Time: 06/23/22 11:30 AM  Result Value  Ref Range   Glucose-Capillary 155 (H) 70 - 99 mg/dL    Comment: Glucose reference range applies only to samples taken after fasting for at least 8 hours.  Glucose, capillary     Status: Abnormal   Collection Time: 06/23/22 12:35 PM  Result Value Ref Range   Glucose-Capillary 138 (H) 70 - 99 mg/dL    Comment: Glucose reference range applies only to samples taken after fasting for at least 8 hours.  Glucose, capillary     Status: Abnormal   Collection Time: 06/23/22  1:35 PM  Result Value Ref Range   Glucose-Capillary 129 (H) 70 - 99 mg/dL    Comment: Glucose reference range applies only to samples taken after fasting for at least 8 hours.  Glucose, capillary     Status: Abnormal   Collection Time: 06/23/22  3:57 PM  Result Value Ref Range   Glucose-Capillary 52 (L) 70 - 99 mg/dL    Comment: Glucose reference range applies only to samples taken after fasting for at least 8 hours.  Glucose, capillary     Status: Abnormal   Collection Time: 06/23/22  4:29 PM  Result Value Ref Range   Glucose-Capillary 58 (L) 70 - 99 mg/dL    Comment: Glucose reference range applies only to samples taken after fasting for at least 8 hours.  Glucose, capillary     Status: None   Collection Time: 06/23/22  4:56 PM  Result Value Ref Range   Glucose-Capillary 74 70 - 99 mg/dL    Comment: Glucose reference range applies only to samples taken after fasting for at least 8 hours.  Glucose, capillary     Status: Abnormal   Collection Time: 06/23/22  8:12 PM  Result Value Ref Range   Glucose-Capillary 176 (H) 70 - 99 mg/dL    Comment: Glucose reference range applies only to samples taken after fasting for at least 8 hours.   Comment 1 Notify RN   Glucose, capillary     Status: Abnormal   Collection Time: 06/23/22  9:19 PM  Result Value Ref Range   Glucose-Capillary 135 (H) 70 - 99 mg/dL    Comment: Glucose reference range applies only to samples taken after fasting for at least 8 hours.  Glucose, capillary      Status: None   Collection Time: 06/23/22 11:50 PM  Result Value Ref Range   Glucose-Capillary 75 70 - 99 mg/dL    Comment: Glucose reference range applies only to samples taken after fasting for at least 8 hours.  Glucose, capillary     Status: Abnormal   Collection Time: 06/24/22  2:22 AM  Result Value Ref Range   Glucose-Capillary 67 (L) 70 - 99 mg/dL    Comment: Glucose reference range applies only to samples taken after fasting for at least 8 hours.  Glucose, capillary     Status: Abnormal   Collection Time: 06/24/22  2:44 AM  Result Value Ref Range   Glucose-Capillary 65 (L) 70 - 99 mg/dL    Comment: Glucose reference range applies only to samples taken after fasting for at least 8 hours.  Glucose, capillary     Status: Abnormal   Collection Time: 06/24/22  3:08 AM  Result Value Ref Range   Glucose-Capillary 104 (H) 70 - 99 mg/dL    Comment: Glucose reference range applies only to samples taken after fasting for at least 8 hours.  Comprehensive metabolic panel     Status: Abnormal   Collection Time: 06/24/22  4:27 AM  Result Value Ref Range   Sodium 131 (L) 135 - 145 mmol/L   Potassium 3.2 (L) 3.5 - 5.1 mmol/L   Chloride 103 98 - 111 mmol/L   CO2 18 (L) 22 - 32 mmol/L   Glucose, Bld 149 (H) 70 - 99 mg/dL    Comment: Glucose reference range applies only to samples taken after fasting for at least 8 hours.   BUN <5 (L) 6 - 20 mg/dL   Creatinine, Ser 0.58 0.44 - 1.00 mg/dL   Calcium 8.8 (L) 8.9 - 10.3 mg/dL   Total Protein 5.9 (L) 6.5 - 8.1 g/dL   Albumin 2.9 (L) 3.5 - 5.0 g/dL   AST 15 15 - 41 U/L   ALT 11 0 - 44 U/L   Alkaline Phosphatase 37 (L) 38 - 126 U/L   Total Bilirubin 0.2 (L) 0.3 - 1.2 mg/dL   GFR, Estimated >60 >60 mL/min    Comment: (NOTE) Calculated using the CKD-EPI Creatinine Equation (2021)    Anion gap 10 5 - 15    Comment: Performed at [facility], [place] [address] [place], [place] [address]  Glucose, capillary     Status: Abnormal   Collection  Time: 06/24/22  4:33 AM  Result Value Ref Range   Glucose-Capillary 148 (H) 70 - 99 mg/dL    Comment: Glucose reference range applies only to samples taken after fasting for at least 8 hours.  Glucose, capillary     Status: Abnormal   Collection Time: 06/24/22  8:13 AM  Result Value Ref Range   Glucose-Capillary 55 (L) 70 - 99 mg/dL    Comment: Glucose reference range applies only to samples taken after fasting for at  least 8 hours.  Glucose, capillary     Status: Abnormal   Collection Time: 06/24/22  8:56 AM  Result Value Ref Range   Glucose-Capillary 160 (H) 70 - 99 mg/dL    Comment: Glucose reference range applies only to samples taken after fasting for at least 8 hours.  Glucose, capillary     Status: Abnormal   Collection Time: 06/24/22 12:10 PM  Result Value Ref Range   Glucose-Capillary 213 (H) 70 - 99 mg/dL    Comment: Glucose reference range applies only to samples taken after fasting for at least 8 hours.  Glucose, capillary     Status: Abnormal   Collection Time: 06/24/22  1:56 PM  Result Value Ref Range   Glucose-Capillary 158 (H) 70 - 99 mg/dL    Comment: Glucose reference range applies only to samples taken after fasting for at least 8 hours.  Glucose, capillary     Status: Abnormal   Collection Time: 06/24/22  4:06 PM  Result Value Ref Range   Glucose-Capillary 67 (L) 70 - 99 mg/dL    Comment: Glucose reference range applies only to samples taken after fasting for at least 8 hours.  Glucose, capillary     Status: Abnormal   Collection Time: 06/24/22  4:31 PM  Result Value Ref Range   Glucose-Capillary 50 (L) 70 - 99 mg/dL    Comment: Glucose reference range applies only to samples taken after fasting for at least 8 hours.  Glucose, capillary     Status: Abnormal   Collection Time: 06/24/22  4:53 PM  Result Value Ref Range   Glucose-Capillary 40 (LL) 70 - 99 mg/dL    Comment: Glucose reference range applies only to samples taken after fasting for at least 8 hours.    Comment 1 Notify RN   Glucose, capillary     Status: Abnormal   Collection Time: 06/24/22  5:27 PM  Result Value Ref Range   Glucose-Capillary 117 (H) 70 - 99 mg/dL    Comment: Glucose reference range applies only to samples taken after fasting for at least 8 hours.  Glucose, capillary     Status: Abnormal   Collection Time: 06/24/22  8:03 PM  Result Value Ref Range   Glucose-Capillary 47 (L) 70 - 99 mg/dL    Comment: Glucose reference range applies only to samples taken after fasting for at least 8 hours.   Comment 1 Notify RN   Glucose, capillary     Status: Abnormal   Collection Time: 06/24/22  8:20 PM  Result Value Ref Range   Glucose-Capillary 47 (L) 70 - 99 mg/dL    Comment: Glucose reference range applies only to samples taken after fasting for at least 8 hours.   Comment 1 Notify RN   Glucose, capillary     Status: Abnormal   Collection Time: 06/24/22  9:04 PM  Result Value Ref Range   Glucose-Capillary 218 (H) 70 - 99 mg/dL    Comment: Glucose reference range applies only to samples taken after fasting for at least 8 hours.  Glucose, capillary     Status: Abnormal   Collection Time: 06/25/22 12:51 AM  Result Value Ref Range   Glucose-Capillary 220 (H) 70 - 99 mg/dL    Comment: Glucose reference range applies only to samples taken after fasting for at least 8 hours.   Comment 1 Notify RN   Glucose, capillary     Status: Abnormal   Collection Time: 06/25/22  4:48 AM  Result Value  Ref Range   Glucose-Capillary 199 (H) 70 - 99 mg/dL    Comment: Glucose reference range applies only to samples taken after fasting for at least 8 hours.   Comment 1 Notify RN   Glucose, capillary     Status: Abnormal   Collection Time: 06/25/22  8:12 AM  Result Value Ref Range   Glucose-Capillary 202 (H) 70 - 99 mg/dL    Comment: Glucose reference range applies only to samples taken after fasting for at least 8 hours.    I have reviewed the patient's current  medications.  ASSESSMENT: Principal Problem:   Increased anion gap metabolic acidosis Active Problems:   Type 1 diabetes mellitus with complications (HCC)   PLAN: No DKA currently  Hyperemesis: -will move to scheduled ant-emetics for control  2. Type 1 DM -decreased semglee to 5 units daily - discussed patient with diabetic coordinator., she will provide a custom sliding scale for better management hopefully. -pt needs endocrinology curbside on 12/4 and once discharged she will need an expedited appointment for management.   Continue routine antenatal care.   Lynnda Shields, MD [place] Attending, Center for [facility] 06/25/2022 11:09 AM

## 2022-06-26 DIAGNOSIS — Z3A19 19 weeks gestation of pregnancy: Secondary | ICD-10-CM | POA: Diagnosis not present

## 2022-06-26 DIAGNOSIS — E8729 Other acidosis: Secondary | ICD-10-CM | POA: Diagnosis not present

## 2022-06-26 LAB — GLUCOSE, CAPILLARY
Glucose-Capillary: 112 mg/dL — ABNORMAL HIGH (ref 70–99)
Glucose-Capillary: 115 mg/dL — ABNORMAL HIGH (ref 70–99)
Glucose-Capillary: 138 mg/dL — ABNORMAL HIGH (ref 70–99)
Glucose-Capillary: 157 mg/dL — ABNORMAL HIGH (ref 70–99)
Glucose-Capillary: 57 mg/dL — ABNORMAL LOW (ref 70–99)
Glucose-Capillary: 63 mg/dL — ABNORMAL LOW (ref 70–99)
Glucose-Capillary: 70 mg/dL (ref 70–99)
Glucose-Capillary: 83 mg/dL (ref 70–99)

## 2022-06-26 MED ORDER — ONDANSETRON 4 MG PO TBDP: 8.0000 mg | ORAL_TABLET | Freq: Three times a day (TID) | ORAL | Status: DC | PRN

## 2022-06-26 MED ORDER — SODIUM CHLORIDE 0.9 % IV SOLN
8.0000 mg | Freq: Three times a day (TID) | INTRAVENOUS | Status: DC | PRN
  Administered 2022-06-27 (×2): 8 mg via INTRAVENOUS

## 2022-06-26 MED ORDER — ONDANSETRON HCL 4 MG/2ML IJ SOLN
4.0000 mg | Freq: Three times a day (TID) | INTRAMUSCULAR | Status: DC | PRN
  Filled 2022-06-26: qty 2

## 2022-06-26 MED ORDER — PANTOPRAZOLE SODIUM 40 MG PO TBEC
40.0000 mg | DELAYED_RELEASE_TABLET | Freq: Every day | ORAL | Status: DC
  Administered 2022-06-26 – 2022-06-28 (×3): 40 mg via ORAL
  Filled 2022-06-26 (×3): qty 1

## 2022-06-26 MED ORDER — SODIUM CHLORIDE 0.9 % IV SOLN
12.5000 mg | Freq: Four times a day (QID) | INTRAVENOUS | Status: DC | PRN
  Administered 2022-06-27: 12.5 mg via INTRAVENOUS

## 2022-06-26 NOTE — BH Specialist Note (Signed)
Integrated Behavioral Health Initial In-Person Visit  MRN: [id number] Name: Sabrina Sutton  Number of [place] Clinician visits: 2 Session Start time:   2:00pm Session End time: 2:39pm Total time in minutes: 39 mins in person at [facility]   Types of Service: Individual psychotherapy  Interpretor:No. Interpretor Name and Language: none   Warm Hand Off Completed.        Subjective: Sabrina Sutton is a 25 y.o. female accompanied by n/a  Patient was referred by Candie Chroman CNM for anxiety. Patient reports the following symptoms/concerns: anxiety Duration of problem: approx 4 months; Severity of problem: mild  Objective: Mood: good and Affect: Appropriate Risk of harm to self or others: No plan to harm self or others  Life Context: Family and Social: lives in [place]  School/Work: looking for work Self-Care: n/a Life Changes: new pregnancy  Patient and/or Family's Strengths/Protective Factors: Concrete supports in place (healthy food, safe environments, etc.)  Goals Addressed: Patient will: Reduce symptoms of: anxiety Increase knowledge and/or ability of: coping skills  Demonstrate ability to: Increase healthy adjustment to current life circumstances  Progress towards Goals: Ongoing  Interventions: Interventions utilized: Motivational Interviewing and Supportive Counseling  Standardized Assessments completed: PHQ 9  Patient and/or Family Response: Sabrina Sutton responded well to visit. Sabrina Sutton social and situational stressors.    Assessment: Patient currently experiencing adjustment disorder with anxiety.   Patient may benefit from integrated behavioral health.  Plan: Follow up with behavioral health clinician on : 07/20/2022 Behavioral recommendations: Prioritize rest, deep breathing, yoga and/or mindfulness activity to reduce anxious mood and feeling on edge.   Referral(s): [place] (In Clinic) "From  scale of 1-10, how likely are you to follow plan?":    Lynnea Ferrier, LCSW

## 2022-06-26 NOTE — Progress Notes (Signed)
Patient ID: Grettel Rames, female   DOB: [birth year], 26 y.o.   MRN: [id number] [place]) NOTE  Azaya Goedde is a 26 y.o. G1P0 at 76w4dby LMP who is admitted for hyperemesis and DKA.   Fetal presentation is unsure. Length of Stay:  4  Days  Subjective: No vomiting yesterday but had emesis today Patient reports the fetal movement as active. Patient reports uterine contraction  activity as none. Patient reports  vaginal bleeding as none. Patient describes fluid per vagina as None.  Vitals:  Blood pressure 128/84, pulse 90, temperature 98.4 F (36.9 C), temperature source Oral, resp. rate 18, height '5\' 7"'$  (1.702 m), weight 52.3 kg, last menstrual period 02/09/2022, SpO2 100 %. Physical Examination:  General appearance - alert, well appearing, and in no distress Heart - normal rate and regular rhythm Abdomen - soft, nontender, nondistended Fundal Height:  size equals dates Extremities: extremities normal, atraumatic, no cyanosis or edema  Membranes:intact  Fetal Monitoring:   Flowsheet Row Most Recent Value  Fetal Heart Rate A   Mode Doppler filed at 06/25/2022 2334    Labs:  Results for orders placed or performed during the hospital encounter of 06/22/22 (from the past 24 hour(s))  Glucose, capillary   Collection Time: 06/25/22 11:59 AM  Result Value Ref Range   Glucose-Capillary 174 (H) 70 - 99 mg/dL  Glucose, capillary   Collection Time: 06/25/22  4:01 PM  Result Value Ref Range   Glucose-Capillary 47 (L) 70 - 99 mg/dL  Glucose, capillary   Collection Time: 06/25/22  4:30 PM  Result Value Ref Range   Glucose-Capillary 123 (H) 70 - 99 mg/dL  Glucose, capillary   Collection Time: 06/25/22  8:06 PM  Result Value Ref Range   Glucose-Capillary 54 (L) 70 - 99 mg/dL  Glucose, capillary   Collection Time: 06/25/22  8:24 PM  Result Value Ref Range   Glucose-Capillary 63 (L) 70 - 99 mg/dL  Glucose, capillary   Collection Time:  06/25/22  8:41 PM  Result Value Ref Range   Glucose-Capillary 89 70 - 99 mg/dL  Glucose, capillary   Collection Time: 06/26/22 12:17 AM  Result Value Ref Range   Glucose-Capillary 57 (L) 70 - 99 mg/dL  Glucose, capillary   Collection Time: 06/26/22 12:37 AM  Result Value Ref Range   Glucose-Capillary 63 (L) 70 - 99 mg/dL  Glucose, capillary   Collection Time: 06/26/22 12:56 AM  Result Value Ref Range   Glucose-Capillary 83 70 - 99 mg/dL  Glucose, capillary   Collection Time: 06/26/22  4:33 AM  Result Value Ref Range   Glucose-Capillary 115 (H) 70 - 99 mg/dL  Glucose, capillary   Collection Time: 06/26/22  8:03 AM  Result Value Ref Range   Glucose-Capillary 112 (H) 70 - 99 mg/dL     Medications:  Scheduled  docusate sodium  100 mg Oral Daily   famotidine  20 mg Oral Q12H   insulin aspart  0-8 Units Subcutaneous Q4H   insulin glargine-yfgn  5 Units Subcutaneous Daily   ondansetron  8 mg Oral Q8H   Or   ondansetron (ZOFRAN) IV  4 mg Intravenous Q8H   prenatal multivitamin  1 tablet Oral Q1200   scopolamine  1 patch Transdermal Q72H   I have reviewed the patient's current medications.  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 Emesis is better but not eliminated yet . Change to Protonix and work toward a insulin pump   Emeterio Reeve 06/26/2022,10:21 AM

## 2022-06-27 DIAGNOSIS — Z3A19 19 weeks gestation of pregnancy: Secondary | ICD-10-CM | POA: Diagnosis not present

## 2022-06-27 DIAGNOSIS — E8729 Other acidosis: Secondary | ICD-10-CM | POA: Diagnosis not present

## 2022-06-27 LAB — GLUCOSE, CAPILLARY
Glucose-Capillary: 100 mg/dL — ABNORMAL HIGH (ref 70–99)
Glucose-Capillary: 103 mg/dL — ABNORMAL HIGH (ref 70–99)
Glucose-Capillary: 121 mg/dL — ABNORMAL HIGH (ref 70–99)
Glucose-Capillary: 130 mg/dL — ABNORMAL HIGH (ref 70–99)
Glucose-Capillary: 172 mg/dL — ABNORMAL HIGH (ref 70–99)
Glucose-Capillary: 44 mg/dL — CL (ref 70–99)
Glucose-Capillary: 52 mg/dL — ABNORMAL LOW (ref 70–99)
Glucose-Capillary: 95 mg/dL (ref 70–99)
Glucose-Capillary: 97 mg/dL (ref 70–99)

## 2022-06-27 NOTE — Progress Notes (Addendum)
Hypoglycemic Event  CBG: 44  Treatment: 8 oz juice/soda  Symptoms: None  Follow-up CBG: Time:0430 CBG Result:52  Hypoglycemic Event  CBG: 52  Treatment: 8 oz juice/soda  Symptoms: Hungry  Follow-up CBG: ZFPO:2518 CBG Result:100  Possible Reasons for Event: Unknown  Comments/MD notified:    Charlynne Cousins Angelina Venard

## 2022-06-27 NOTE — Progress Notes (Signed)
Patient ID: Sabrina Sutton, female   DOB: 11-Sep-1995, 26 y.o.   MRN: [id number] [place]) NOTE  Sabrina Sutton is a 26 y.o. G1P0 at 82w5dby LMP who is admitted for hyperemesis and DKA  .   Fetal presentation is unsure. Length of Stay:  5  Days  Subjective: Emesis and low BG last night but her appetite is improving Patient reports the fetal movement as active. Patient reports uterine contraction  activity as none. Patient reports  vaginal bleeding as none. Patient describes fluid per vagina as None.  Vitals:  Blood pressure 117/73, pulse 97, temperature 98.3 F (36.8 C), temperature source Oral, resp. rate 19, height '5\' 7"'$  (1.702 m), weight 53.1 kg, last menstrual period 02/09/2022, SpO2 99 %. Physical Examination:  General appearance - alert, well appearing, and in no distress Heart - normal rate and regular rhythm Abdomen - soft, nontender, nondistended Fundal Height:  size equals dates Cervical Exam: Not evaluated. . Extremities: extremities normal, atraumatic, no cyanosis or edema  Membranes:intact  Fetal Monitoring:   Flowsheet Row Most Recent Value  Fetal Heart Rate A   Mode Doppler filed at 06/26/2022 0930  Baseline Rate (A) 138 bpm filed at 06/26/2022 0930    Labs:  Results for orders placed or performed during the hospital encounter of 06/22/22 (from the past 24 hour(s))  Glucose, capillary   Collection Time: 06/26/22 12:06 PM  Result Value Ref Range   Glucose-Capillary 138 (H) 70 - 99 mg/dL  Glucose, capillary   Collection Time: 06/26/22  4:10 PM  Result Value Ref Range   Glucose-Capillary 70 70 - 99 mg/dL  Glucose, capillary   Collection Time: 06/26/22  8:06 PM  Result Value Ref Range   Glucose-Capillary 157 (H) 70 - 99 mg/dL  Glucose, capillary   Collection Time: 06/27/22 12:01 AM  Result Value Ref Range   Glucose-Capillary 121 (H) 70 - 99 mg/dL  Glucose, capillary   Collection Time: 06/27/22  4:03 AM  Result Value  Ref Range   Glucose-Capillary 44 (LL) 70 - 99 mg/dL  Glucose, capillary   Collection Time: 06/27/22  4:30 AM  Result Value Ref Range   Glucose-Capillary 52 (L) 70 - 99 mg/dL  Glucose, capillary   Collection Time: 06/27/22  4:50 AM  Result Value Ref Range   Glucose-Capillary 100 (H) 70 - 99 mg/dL  Glucose, capillary   Collection Time: 06/27/22  8:00 AM  Result Value Ref Range   Glucose-Capillary 172 (H) 70 - 99 mg/dL     Medications:  Scheduled  docusate sodium  100 mg Oral Daily   insulin aspart  0-8 Units Subcutaneous Q4H   insulin glargine-yfgn  5 Units Subcutaneous Daily   pantoprazole  40 mg Oral Daily   prenatal multivitamin  1 tablet Oral Q1200   scopolamine  1 patch Transdermal Q72H   I have reviewed the patient's current medications.  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

## 2022-06-27 NOTE — Inpatient Diabetes Management (Addendum)
Inpatient Diabetes Program Recommendations  Diabetes Treatment Program Recommendations  ADA Standards of Care Diabetes in Pregnancy Target Glucose Ranges:  Fasting: 70 - 95 mg/dL 1 hr postprandial: Less than '140mg'$ /dL (from first bite of meal) 2 hr postprandial: Less than 120 mg/dL (from first bite of meal)    Lab Results  Component Value Date   GLUCAP 172 (H) 06/27/2022   HGBA1C 6.4 (H) 06/01/2022   Review of Glycemic Control  Latest Reference Range & Units 06/27/22 04:03 06/27/22 04:30 06/27/22 04:50 06/27/22 08:00  Glucose-Capillary 70 - 99 mg/dL 44 (LL) 52 (L) 100 (H) 172 (H)   Diabetes history: Type 1 DM   Outpatient DM meds: Omnipod Lantus 25 units BID Novolog 8 units TID   Current orders: Semglee 5 units daily, Novolog 0-8 units Q4H  Inpatient Diabetes Program Recommendations:    Consider discontinuing Semglee.   Noted hypoglycemia of 44 mg/dL following Novolog 1 unit.   Will plan to see.   Addendum:   Spoke with patient again regarding intake and continued hypoglycemia. Patient reports being able to tolerate most meals, however, they are small and infrequent.  Reviewed current insulin dosing with patient and difficulty of controlling blood sugars when dosing is so much less compared to several weeks ago and still continues with hypoglycemia.  Question if patient is making a portion of insulin? Although unusual, it is possible; making this case particularly challenging, especially given variable intake.  Secure chat sent to Dr Roselie Awkward and to follow up on possibility for endocrinology follow up.  Additionally, applied Dexcom G7 to patient's right upper arm.   Thanks,  Bronson Curb, MSN, RNC-OB Diabetes Coordinator [phone] (8a-5p)

## 2022-06-28 DIAGNOSIS — Z3A19 19 weeks gestation of pregnancy: Secondary | ICD-10-CM | POA: Diagnosis not present

## 2022-06-28 DIAGNOSIS — E8729 Other acidosis: Secondary | ICD-10-CM | POA: Diagnosis not present

## 2022-06-28 LAB — GLUCOSE, CAPILLARY
Glucose-Capillary: 110 mg/dL — ABNORMAL HIGH (ref 70–99)
Glucose-Capillary: 197 mg/dL — ABNORMAL HIGH (ref 70–99)
Glucose-Capillary: 187 mg/dL — ABNORMAL HIGH (ref 70–99)

## 2022-06-28 MED ORDER — ONDANSETRON 8 MG PO TBDP: 8.0000 mg | ORAL_TABLET | Freq: Three times a day (TID) | ORAL | 0 refills | Status: DC | PRN

## 2022-06-28 MED ORDER — PANTOPRAZOLE SODIUM 40 MG PO TBEC: 40.0000 mg | DELAYED_RELEASE_TABLET | Freq: Every day | ORAL | 4 refills | Status: DC

## 2022-06-28 MED ORDER — PROMETHAZINE HCL 12.5 MG PO TABS: 12.5000 mg | ORAL_TABLET | ORAL | 0 refills | Status: DC | PRN

## 2022-06-28 MED ORDER — SERTRALINE HCL 50 MG PO TABS: 50.0000 mg | ORAL_TABLET | Freq: Every day | ORAL | 4 refills | Status: DC

## 2022-06-28 MED ORDER — HYDROXYZINE HCL 50 MG PO TABS: 50.0000 mg | ORAL_TABLET | Freq: Four times a day (QID) | ORAL | 0 refills | Status: DC | PRN

## 2022-06-28 NOTE — Progress Notes (Incomplete)
Patient ID: Sabrina Sutton, female   DOB: April 04, 1996, 26 y.o.   MRN: [id number] [place]) NOTE  Sabrina Sutton is a 26 y.o. G1P0 at 23w6dby {Ob dating:14516} who is admitted for {Reason for Admission:21615}.   Fetal presentation is {desc; fetal presentation:14558}. Length of Stay:  6  Days  Subjective: *** Patient reports the fetal movement as {Fetal Movement:20184}. Patient reports uterine contraction  activity as {obgyn contractions reg/irreg:312982}. Patient reports  vaginal bleeding as {desc; vaginal bleeding:14141:s}. Patient describes fluid per vagina as {desc; amniotic fluid:31309}.  Vitals:  Blood pressure 124/83, pulse (!) 107, temperature 98.1 F (36.7 C), temperature source Oral, resp. rate 17, height '5\' 7"'$  (1.702 m), weight 52.6 kg, last menstrual period 02/09/2022, SpO2 99 %. Physical Examination:  General appearance - {:315021} Heart - normal rate and regular rhythm Abdomen - soft, nontender, nondistended Fundal Height:  {findings; fundal height:14540} Cervical Exam: {Pe_cervix_labor:31295} and found to be {:31290}/ {Ob effacement:14523}/{station:14562} and fetal presentation is {desc; fetal presentation:14558}. Extremities: extremities normal, atraumatic, no cyanosis or edema and Homans sign is negative, no sign of DVT with DTRs 2+ bilaterally Membranes:{:314523}  Fetal Monitoring:  {findings; monitor fetal heart monitor:31527}  Labs:  Results for orders placed or performed during the hospital encounter of 06/22/22 (from the past 24 hour(s))  Glucose, capillary   Collection Time: 06/27/22 12:02 PM  Result Value Ref Range   Glucose-Capillary 130 (H) 70 - 99 mg/dL  Glucose, capillary   Collection Time: 06/27/22  1:29 PM  Result Value Ref Range   Glucose-Capillary 97 70 - 99 mg/dL  Glucose, capillary   Collection Time: 06/27/22  4:01 PM  Result Value Ref Range   Glucose-Capillary 95 70 - 99 mg/dL  Glucose, capillary    Collection Time: 06/27/22  8:21 PM  Result Value Ref Range   Glucose-Capillary 103 (H) 70 - 99 mg/dL  Glucose, capillary   Collection Time: 06/28/22 12:18 AM  Result Value Ref Range   Glucose-Capillary 187 (H) 70 - 99 mg/dL   Comment 1 QC Due   Glucose, capillary   Collection Time: 06/28/22  4:17 AM  Result Value Ref Range   Glucose-Capillary 110 (H) 70 - 99 mg/dL  Glucose, capillary   Collection Time: 06/28/22  8:12 AM  Result Value Ref Range   Glucose-Capillary 197 (H) 70 - 99 mg/dL     Medications:  Scheduled  docusate sodium  100 mg Oral Daily   insulin aspart  0-8 Units Subcutaneous Q4H   pantoprazole  40 mg Oral Daily   prenatal multivitamin  1 tablet Oral Q1200   scopolamine  1 patch Transdermal Q72H   {medication reviewed/display:3041432}  ASSESSMENT: Patient Active Problem List   Diagnosis Date Noted   Increased anion gap metabolic acidosis 140/98/1191  Supervision of high risk pregnancy, antepartum 04/20/2022   Anxiety 04/07/2021   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

## 2022-06-28 NOTE — Inpatient Diabetes Management (Addendum)
Inpatient Diabetes Program Recommendations  AACE/ADA: New Consensus Statement on Inpatient Glycemic Control (2015)  Target Ranges:  Prepandial:   less than 140 mg/dL      Peak postprandial:   less than 180 mg/dL (1-2 hours)      Critically ill patients:  140 - 180 mg/dL   Lab Results  Component Value Date   GLUCAP 197 (H) 06/28/2022   HGBA1C 6.4 (H) 06/01/2022    Review of Glycemic Control  Latest Reference Range & Units 06/27/22 12:02 06/27/22 13:29 06/27/22 16:01 06/27/22 20:21 06/28/22 00:18 06/28/22 04:17 06/28/22 08:12  Glucose-Capillary 70 - 99 mg/dL 130 (H) 97 95 103 (H) 187 (H) 110 (H) 197 (H)  (H): Data is abnormally high  Diabetes history: DM1 Outpatient Diabetes medications: Omnipod, Lantus 25 units TID, Novolog 8 units TID Current orders for Inpatient glycemic control: Novolog 0-8 units Q4H  Inpatient Diabetes Program Recommendations:    Please consider:  Consider NPH at bedtime to help with fasting glucose and changing correction to TID to avoid stacking.  NPH 4 units QHS Novolog 0-8 units TID after meals    Spoke with patient at bedside.  She states she ate well last evening and feels better today.  She is currently ordering breakfast.   .   Will continue to follow while inpatient.  Thank you, Reche Dixon, MSN, [place] Diabetes Coordinator Inpatient Diabetes Program [phone] (team pager from 8a-5p)

## 2022-06-28 NOTE — Discharge Summary (Signed)
Physician Discharge Summary  Patient ID: Sabrina Sutton MRN: [id number] DOB/AGE: 03/14/1996 26 y.o.  Admit date: 06/22/2022 Discharge date: 06/28/2022  Admission Diagnoses:Hyperemesis gravidarum, MIld diabetic ketoacidosis  Discharge Diagnoses:  Principal Problem:   Increased anion gap metabolic acidosis Active Problems:   Type 1 diabetes mellitus with complications (HCC)   Discharged Condition: good  Hospital Course: Sabrina Sutton is a 26 y.o. at [redacted]w[redacted]d who presents for n/v & hyperglycemia. She is type 1 diabetic with history of DKA. Has had persistent n/v during this pregnancy.  Reports vomiting multiple times today & unable to eat or drink. Took diclegis this morning with no relief. Took reglan last night but reports being out of her zofran prescription.  Blood sugar this morning got up to 305, after taking lantus & humalog. Denies fever, diarrhea, abdominal pain, or vaginal bleeding.   During hospitalization she was given antiemetics and IV fluids and her insulin dosing was managed for BG control. Her electrolytes normalized showing her DKA resolved. Her nausea gradually improved and she was tolerating her diet at discharge. Inpatient diabetes coordinators helped with her management and she was scheduled to see [facility] in [place] on 12/11 at 2 pm.  Consults:  Diabetes management  Significant Diagnostic Studies: labs: CMP   Treatments: IV hydration, insulin: Humalog and Lantus, and antiemetics  Discharge Exam: Blood pressure 124/83, pulse (!) 107, temperature 98.1 F (36.7 C), temperature source Oral, resp. rate 17, height 5' 7" (1.702 m), weight 52.6 kg, last menstrual period 02/09/2022, SpO2 99 %. General appearance: alert, cooperative, and no distress Head: Normocephalic, without obvious abnormality, atraumatic Resp: normal effort GI: gravid and non tender Extremities: extremities normal, atraumatic, no cyanosis or edema  Disposition: Discharge  disposition: 01-Home or Self Care       Discharge Instructions     Discharge patient   Complete by: As directed    Discharge disposition: 01-Home or Self Care   Discharge patient date: 06/28/2022      Allergies as of 06/28/2022   No Known Allergies      Medication List     STOP taking these medications    acetaminophen 500 MG tablet Commonly known as: TYLENOL   Diclegis 10-10 MG Tbec Generic drug: Doxylamine-Pyridoxine   potassium chloride SA 20 MEQ tablet Commonly known as: KLOR-CON M       TAKE these medications    Accu-Chek Aviva Plus test strip Generic drug: glucose blood 1 each by Other route See admin instructions. Use as instructed   accu-chek soft touch lancets Use as directed.   Accu-Chek FastClix Lancets Misc Check sugar 10 x daily   Alcohol Pads 70 % Pads Use to wipe skin prior to insulin injections twice daily   B-D UF III MINI PEN NEEDLES 31G X 5 MM Misc Generic drug: Insulin Pen Needle USE TO CHECK BLOOD SUGAR IN THE MORNING BEFORE EATING, BEFORE EACH MEAL, AND AS NEEDED   Blood Pressure Kit Devi 1 kit by Does not apply route once a week.   Dexcom G6 Receiver Devi USE AS DIRECTED   Dexcom G6 Sensor Misc Inject 1 applicator into the skin as directed. (change sensor every 10 days)   Dexcom G6 Transmitter Misc INJECT 1 DEVICE UNDER THE SKIN AS DIRECTED UP TO 8 TIMES WITH EACH NEW SENSOR   Ensure High Protein Liqd Take 1 each by mouth daily at 6 (six) AM.   Gojji Weight Scale Misc 1 Device by Does not apply route every 30 (thirty) days.     hydrOXYzine 50 MG tablet Commonly known as: ATARAX Take 1 tablet (50 mg total) by mouth every 6 (six) hours as needed for nausea or vomiting.   InPen 100-Pink-Novo Devi Generic drug: injection device for insulin Use as directed with Novolog cartridges.   insulin lispro 100 UNIT/ML KwikPen Commonly known as: HumaLOG KwikPen This is being substituted for Fiasp. Inject 8 Units into the skin  with breakfast, with lunch, and with evening meal. Add 2 units Humalog with evening snacks if at least 2 hours past evening meal. What changed:  how much to take how to take this when to take this   Lantus SoloStar 100 UNIT/ML Solostar Pen Generic drug: insulin glargine ADMINISTER 25 UNITS UNDER THE SKIN DAILY What changed: See the new instructions.   metoCLOPramide 10 MG tablet Commonly known as: REGLAN Take 1 tablet (10 mg total) by mouth every 8 (eight) hours as needed. What changed: Another medication with the same name was removed. Continue taking this medication, and follow the directions you see here.   Omnipod 5 G6 Pod (Gen 5) Misc 1 Units by Does not apply route every 3 (three) days.   ondansetron 8 MG disintegrating tablet Commonly known as: ZOFRAN-ODT Take 1 tablet (8 mg total) by mouth every 8 (eight) hours as needed for nausea or vomiting. What changed:  medication strength how much to take   pantoprazole 40 MG tablet Commonly known as: PROTONIX Take 1 tablet (40 mg total) by mouth daily.   Prenatal Vitamin 27-0.8 MG Tabs Take 1 tablet by mouth daily.   promethazine 12.5 MG tablet Commonly known as: PHENERGAN Take 1-2 tablets (12.5-25 mg total) by mouth every 4 (four) hours as needed for nausea or vomiting.   sertraline 50 MG tablet Commonly known as: Zoloft Take 1 tablet (50 mg total) by mouth daily.        Follow-up Information     [facility] Follow up in 1 week(s).   Specialty: Obstetrics and Gynecology Contact information: [address], [address]  [place] [address] [phone]              [facility] 12/11 2 PM  Signed:   06/28/2022, 10:39 AM   

## 2022-07-03 ENCOUNTER — Encounter: Payer: Self-pay | Admitting: Nurse Practitioner

## 2022-07-03 ENCOUNTER — Ambulatory Visit (INDEPENDENT_AMBULATORY_CARE_PROVIDER_SITE_OTHER): Payer: Medicaid Other | Admitting: Nurse Practitioner

## 2022-07-03 VITALS — BP 93/59 | HR 103 | Ht 67.0 in | Wt 116.2 lb

## 2022-07-03 DIAGNOSIS — Z794 Long term (current) use of insulin: Secondary | ICD-10-CM

## 2022-07-03 DIAGNOSIS — E109 Type 1 diabetes mellitus without complications: Secondary | ICD-10-CM | POA: Diagnosis not present
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
## 2022-07-03 NOTE — Patient Instructions (Signed)

## 2022-07-08 ENCOUNTER — Other Ambulatory Visit: Payer: Self-pay | Admitting: Obstetrics & Gynecology

## 2022-07-10 ENCOUNTER — Encounter: Payer: Self-pay | Admitting: Certified Nurse Midwife

## 2022-07-11 ENCOUNTER — Other Ambulatory Visit: Payer: Self-pay | Admitting: Emergency Medicine

## 2022-07-11 ENCOUNTER — Encounter: Payer: Self-pay | Admitting: Registered"

## 2022-07-11 MED ORDER — PROMETHAZINE HCL 12.5 MG PO TABS: 12.5000 mg | ORAL_TABLET | ORAL | 0 refills | Status: DC | PRN

## 2022-07-11 MED ORDER — ONDANSETRON HCL 4 MG PO TABS: 4.0000 mg | ORAL_TABLET | Freq: Three times a day (TID) | ORAL | 0 refills | Status: DC | PRN

## 2022-07-11 NOTE — Progress Notes (Signed)
Rx refill for nausea.  Approved by Fatima Blank, CNM.

## 2022-07-11 NOTE — Progress Notes (Signed)
The following message was sent to patient via email with attachment of instructions to register omnipod kit:  I am a dietitian and certified diabetes educator. I have also been certified to start people on the Omnipod insulin pump.  I have an office in the [facility] [place] location Tuesdays and Thursdays, but I can also do pump trainings at my other location across from [facility].  Before I can be assigned to train you on how to use the pump, you will need to register the kit with insulet, the makers of Omnipod. I have attached the instructions for registering your Omnipod kit, but please wait to register until I am able to clarify which doctor will be managing your insulin pump during pregnancy. I will contact you again after I contact your endocrinologist's office and the prescribing doctor at [facility] [place]  In the meantime, please go ahead and get the kit from the pharmacy and also you can watch videos that are helpful in getting familiar with the product: Omnipod 5 Video Tutorials  Omnipod  There are a lot of videos, don't feel like you have to watch them all. Starting with the basics can help make our training session go a lot quicker and you will be more confident with using the pump after.

## 2022-07-13 ENCOUNTER — Other Ambulatory Visit: Payer: Self-pay | Admitting: Nurse Practitioner

## 2022-07-13 MED ORDER — INSULIN LISPRO 100 UNIT/ML IJ SOLN: INTRAMUSCULAR | 3 refills | Status: DC

## 2022-07-18 ENCOUNTER — Inpatient Hospital Stay (HOSPITAL_COMMUNITY)
Admission: AD | Admit: 2022-07-18 | Discharge: 2022-07-18 | Disposition: A | Discharge status: Home / Self Care | Payer: Medicaid Other | Attending: Obstetrics & Gynecology | Admitting: Obstetrics & Gynecology

## 2022-07-18 ENCOUNTER — Encounter (HOSPITAL_COMMUNITY): Payer: Self-pay | Admitting: Obstetrics & Gynecology

## 2022-07-18 DIAGNOSIS — O24112 Pre-existing diabetes mellitus, type 2, in pregnancy, second trimester: Secondary | ICD-10-CM | POA: Insufficient documentation

## 2022-07-18 DIAGNOSIS — O0990 Supervision of high risk pregnancy, unspecified, unspecified trimester: Secondary | ICD-10-CM

## 2022-07-18 DIAGNOSIS — K5900 Constipation, unspecified: Secondary | ICD-10-CM | POA: Diagnosis not present

## 2022-07-18 DIAGNOSIS — Z79899 Other long term (current) drug therapy: Secondary | ICD-10-CM | POA: Diagnosis not present

## 2022-07-18 DIAGNOSIS — F419 Anxiety disorder, unspecified: Secondary | ICD-10-CM

## 2022-07-18 DIAGNOSIS — Z87891 Personal history of nicotine dependence: Secondary | ICD-10-CM | POA: Insufficient documentation

## 2022-07-18 DIAGNOSIS — O210 Mild hyperemesis gravidarum: Secondary | ICD-10-CM | POA: Diagnosis not present

## 2022-07-18 DIAGNOSIS — O26892 Other specified pregnancy related conditions, second trimester: Secondary | ICD-10-CM | POA: Diagnosis present

## 2022-07-18 DIAGNOSIS — Z3A22 22 weeks gestation of pregnancy: Secondary | ICD-10-CM | POA: Diagnosis not present

## 2022-07-18 DIAGNOSIS — O99612 Diseases of the digestive system complicating pregnancy, second trimester: Secondary | ICD-10-CM | POA: Diagnosis not present

## 2022-07-18 DIAGNOSIS — Z794 Long term (current) use of insulin: Secondary | ICD-10-CM | POA: Diagnosis not present

## 2022-07-18 DIAGNOSIS — R112 Nausea with vomiting, unspecified: Secondary | ICD-10-CM | POA: Diagnosis not present

## 2022-07-18 DIAGNOSIS — E8729 Other acidosis: Secondary | ICD-10-CM

## 2022-07-18 DIAGNOSIS — I959 Hypotension, unspecified: Secondary | ICD-10-CM | POA: Diagnosis not present

## 2022-07-18 DIAGNOSIS — R000 Tachycardia, unspecified: Secondary | ICD-10-CM | POA: Diagnosis not present

## 2022-07-18 DIAGNOSIS — E860 Dehydration: Secondary | ICD-10-CM

## 2022-07-18 DIAGNOSIS — E108 Type 1 diabetes mellitus with unspecified complications: Secondary | ICD-10-CM

## 2022-07-18 DIAGNOSIS — M545 Low back pain, unspecified: Secondary | ICD-10-CM | POA: Diagnosis not present

## 2022-07-18 DIAGNOSIS — M549 Dorsalgia, unspecified: Secondary | ICD-10-CM | POA: Diagnosis not present

## 2022-07-18 HISTORY — DX: Mild hyperemesis gravidarum: O21.0

## 2022-07-18 LAB — COMPREHENSIVE METABOLIC PANEL
ALT: 10 U/L (ref 0–44)
AST: 19 U/L (ref 15–41)
Albumin: 3.8 g/dL (ref 3.5–5.0)
Alkaline Phosphatase: 53 U/L (ref 38–126)
Anion gap: 12 (ref 5–15)
BUN: 8 mg/dL (ref 6–20)
CO2: 27 mmol/L (ref 22–32)
Calcium: 9.9 mg/dL (ref 8.9–10.3)
Chloride: 98 mmol/L (ref 98–111)
Creatinine, Ser: 0.57 mg/dL (ref 0.44–1.00)
GFR, Estimated: >60 mL/min (ref >60)
Glucose, Bld: 138 mg/dL — ABNORMAL HIGH (ref 70–99)
Potassium: 3.6 mmol/L (ref 3.5–5.1)
Sodium: 137 mmol/L (ref 135–145)
Total Bilirubin: 0.4 mg/dL (ref 0.3–1.2)
Total Protein: 7.6 g/dL (ref 6.5–8.1)

## 2022-07-18 LAB — URINALYSIS, ROUTINE W REFLEX MICROSCOPIC
Bacteria, UA: NONE SEEN
Bilirubin Urine: NEGATIVE
Glucose, UA: 150 mg/dL — AB
Hgb urine dipstick: NEGATIVE
Ketones, ur: 80 mg/dL — AB
Nitrite: NEGATIVE
Protein, ur: 30 mg/dL — AB
Specific Gravity, Urine: 1.018 (ref 1.005–1.030)
pH: 6 (ref 5.0–8.0)

## 2022-07-18 LAB — CBC
HCT: 36.8 % (ref 36.0–46.0)
Hemoglobin: 12.6 g/dL (ref 12.0–15.0)
MCH: 29.6 pg (ref 26.0–34.0)
MCHC: 34.2 g/dL (ref 30.0–36.0)
MCV: 86.4 fL (ref 80.0–100.0)
Platelets: 388 10*3/uL (ref 150–400)
RBC: 4.26 MIL/uL (ref 3.87–5.11)
RDW: 13.2 % (ref 11.5–15.5)
WBC: 10 10*3/uL (ref 4.0–10.5)
nRBC: 0 % (ref 0.0–0.2)

## 2022-07-18 LAB — BETA-HYDROXYBUTYRIC ACID: Beta-Hydroxybutyric Acid: 0.2 mmol/L (ref 0.05–0.27)

## 2022-07-18 MED ORDER — SCOPOLAMINE 1 MG/3DAYS TD PT72
1.0000 | MEDICATED_PATCH | Freq: Once | TRANSDERMAL | Status: DC
  Administered 2022-07-18: 1.5 mg via TRANSDERMAL
  Filled 2022-07-18: qty 1

## 2022-07-18 MED ORDER — POLYETHYLENE GLYCOL 3350 17 GM/SCOOP PO POWD: 17.0000 g | Freq: Every day | ORAL | 0 refills | Status: DC

## 2022-07-18 MED ORDER — ONDANSETRON HCL 4 MG/2ML IJ SOLN
4.0000 mg | Freq: Once | INTRAMUSCULAR | Status: AC
  Administered 2022-07-18: 4 mg via INTRAVENOUS
  Filled 2022-07-18: qty 2

## 2022-07-18 MED ORDER — FAMOTIDINE IN NACL 20-0.9 MG/50ML-% IV SOLN
20.0000 mg | Freq: Once | INTRAVENOUS | Status: AC
  Administered 2022-07-18: 20 mg via INTRAVENOUS
  Filled 2022-07-18: qty 50

## 2022-07-18 MED ORDER — LACTATED RINGERS IV BOLUS
1000.0000 mL | Freq: Once | INTRAVENOUS | Status: AC
  Administered 2022-07-18: 1000 mL via INTRAVENOUS

## 2022-07-18 MED ORDER — SCOPOLAMINE 1 MG/3DAYS TD PT72: 1.0000 | MEDICATED_PATCH | TRANSDERMAL | 1 refills | Status: DC

## 2022-07-18 MED ORDER — ACETAMINOPHEN 500 MG PO TABS
1000.0000 mg | ORAL_TABLET | Freq: Once | ORAL | Status: AC
  Administered 2022-07-18: 1000 mg via ORAL
  Filled 2022-07-18: qty 2

## 2022-07-18 NOTE — MAU Note (Addendum)
...  Sabrina Sutton is a 26 y.o. at 54w5dhere in MAU reporting: Today is day three of persistent N/V. She reports she was able to eat last night around 1900 but this morning she woke up around 0500 due to nausea and has been vomiting since. She states she has also been battling lower back pain for the entirety of her pregnancy and rates it a 7/10. She reports she is a Type 1 diabetic. Denies VB or LOF. +FM.  CBG 220 with EMS at patient's home.  2nd CBG 176 on the EMS truck on the way here.  Took 4 units of humalog this morning around 0530 for a cbg of 160. Has not taken any Lantus today.  [facility]  Onset of complaint: x3 days ago Pain score: 7/10 - lower back  FHT: 145 doppler  Lab orders placed from triage:  UA
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
## 2022-07-19 ENCOUNTER — Encounter (HOSPITAL_COMMUNITY): Payer: Self-pay | Admitting: Obstetrics & Gynecology

## 2022-07-19 ENCOUNTER — Other Ambulatory Visit: Payer: Self-pay

## 2022-07-19 ENCOUNTER — Inpatient Hospital Stay (HOSPITAL_COMMUNITY)
Admission: AD | Admit: 2022-07-19 | Discharge: 2022-07-19 | Disposition: A | Discharge status: Home / Self Care | Payer: Medicaid Other | Attending: Obstetrics & Gynecology | Admitting: Obstetrics & Gynecology

## 2022-07-19 DIAGNOSIS — O219 Vomiting of pregnancy, unspecified: Secondary | ICD-10-CM | POA: Diagnosis present

## 2022-07-19 DIAGNOSIS — R110 Nausea: Secondary | ICD-10-CM | POA: Diagnosis not present

## 2022-07-19 DIAGNOSIS — R112 Nausea with vomiting, unspecified: Secondary | ICD-10-CM | POA: Diagnosis not present

## 2022-07-19 DIAGNOSIS — F419 Anxiety disorder, unspecified: Secondary | ICD-10-CM

## 2022-07-19 DIAGNOSIS — Z87891 Personal history of nicotine dependence: Secondary | ICD-10-CM | POA: Insufficient documentation

## 2022-07-19 DIAGNOSIS — E8729 Other acidosis: Secondary | ICD-10-CM

## 2022-07-19 DIAGNOSIS — O210 Mild hyperemesis gravidarum: Secondary | ICD-10-CM | POA: Diagnosis not present

## 2022-07-19 DIAGNOSIS — O0990 Supervision of high risk pregnancy, unspecified, unspecified trimester: Secondary | ICD-10-CM

## 2022-07-19 DIAGNOSIS — R1111 Vomiting without nausea: Secondary | ICD-10-CM | POA: Diagnosis not present

## 2022-07-19 DIAGNOSIS — Z3A22 22 weeks gestation of pregnancy: Secondary | ICD-10-CM | POA: Diagnosis not present

## 2022-07-19 LAB — BLOOD GAS, VENOUS
Acid-Base Excess: 6.7 mmol/L — ABNORMAL HIGH (ref 0.0–2.0)
Bicarbonate: 30.4 mmol/L — ABNORMAL HIGH (ref 20.0–28.0)
O2 Saturation: 23.9 %
Patient temperature: 37
pCO2, Ven: 39 mmHg — ABNORMAL LOW (ref 44–60)
pH, Ven: 7.5 — ABNORMAL HIGH (ref 7.25–7.43)
pO2, Ven: <31 mmHg — CL (ref 32–45)

## 2022-07-19 LAB — URINALYSIS, ROUTINE W REFLEX MICROSCOPIC
Bilirubin Urine: NEGATIVE
Glucose, UA: 150 mg/dL — AB
Hgb urine dipstick: NEGATIVE
Ketones, ur: 80 mg/dL — AB
Leukocytes,Ua: NEGATIVE
Nitrite: NEGATIVE
Protein, ur: 30 mg/dL — AB
Specific Gravity, Urine: 1.02 (ref 1.005–1.030)
pH: 7 (ref 5.0–8.0)

## 2022-07-19 LAB — COMPREHENSIVE METABOLIC PANEL
ALT: 10 U/L (ref 0–44)
AST: 16 U/L (ref 15–41)
Albumin: 3.4 g/dL — ABNORMAL LOW (ref 3.5–5.0)
Alkaline Phosphatase: 45 U/L (ref 38–126)
Anion gap: 11 (ref 5–15)
BUN: 6 mg/dL (ref 6–20)
CO2: 25 mmol/L (ref 22–32)
Calcium: 9.3 mg/dL (ref 8.9–10.3)
Chloride: 100 mmol/L (ref 98–111)
Creatinine, Ser: 0.66 mg/dL (ref 0.44–1.00)
GFR, Estimated: >60 mL/min (ref >60)
Glucose, Bld: 73 mg/dL (ref 70–99)
Potassium: 3.5 mmol/L (ref 3.5–5.1)
Sodium: 136 mmol/L (ref 135–145)
Total Bilirubin: 0.4 mg/dL (ref 0.3–1.2)
Total Protein: 6.7 g/dL (ref 6.5–8.1)

## 2022-07-19 LAB — BETA-HYDROXYBUTYRIC ACID: Beta-Hydroxybutyric Acid: 0.64 mmol/L — ABNORMAL HIGH (ref 0.05–0.27)

## 2022-07-19 MED ORDER — METOCLOPRAMIDE HCL 10 MG PO TABS: 10.0000 mg | ORAL_TABLET | Freq: Four times a day (QID) | ORAL | 2 refills | Status: DC
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
## 2022-07-19 NOTE — MAU Note (Signed)
Sabrina Sutton is a 26 y.o. at 69w6dhere in MAU reporting: she has N/V, seen in MAU yesterday for same.  Currently wearing patch and took Promethazine this morning between 0900 and 1000.  Also reporting intermittent lower back pain,  LMP: NA Onset of complaint: yesterday Pain score: 7 Vitals:   07/19/22 1350  BP: 109/66  Pulse: (!) 105  Resp: 18  Temp: 98.3 F (36.8 C)  SpO2: 100%     FHT:142 bpm Lab orders placed from triage:   UA

## 2022-07-19 NOTE — MAU Note (Signed)
CRITICAL VALUE STICKER  CRITICAL VALUE: Venous O2 <31  RECEIVER (on-site recipient of call): Cloretta Ned, RN  [place] NOTIFIED: [phone]  MESSENGER (representative from lab): Vivien Rota  MD NOTIFIED: Dr. Si Raider, MD  TIME OF NOTIFICATION: 1320  RESPONSE: MD aware

## 2022-07-20 ENCOUNTER — Ambulatory Visit (INDEPENDENT_AMBULATORY_CARE_PROVIDER_SITE_OTHER): Payer: Medicaid Other | Admitting: Obstetrics and Gynecology

## 2022-07-20 VITALS — BP 113/78 | HR 121 | Wt 113.3 lb

## 2022-07-20 DIAGNOSIS — O365920 Maternal care for other known or suspected poor fetal growth, second trimester, not applicable or unspecified: Secondary | ICD-10-CM

## 2022-07-20 DIAGNOSIS — B009 Herpesviral infection, unspecified: Secondary | ICD-10-CM

## 2022-07-20 DIAGNOSIS — E108 Type 1 diabetes mellitus with unspecified complications: Secondary | ICD-10-CM

## 2022-07-20 DIAGNOSIS — O210 Mild hyperemesis gravidarum: Secondary | ICD-10-CM

## 2022-07-20 DIAGNOSIS — O0990 Supervision of high risk pregnancy, unspecified, unspecified trimester: Secondary | ICD-10-CM

## 2022-07-20 DIAGNOSIS — Z3A23 23 weeks gestation of pregnancy: Secondary | ICD-10-CM

## 2022-07-20 DIAGNOSIS — O0992 Supervision of high risk pregnancy, unspecified, second trimester: Secondary | ICD-10-CM

## 2022-07-20 MED ORDER — ENSURE ENLIVE PO LIQD: 1.0000 | Freq: Two times a day (BID) | ORAL | Status: DC

## 2022-07-20 NOTE — Progress Notes (Signed)
   PRENATAL VISIT NOTE  Subjective:  Sabrina Sutton is a 26 y.o. G1P0 at 44w0dbeing seen today for ongoing prenatal care.  She is currently monitored for the following issues for this high-risk pregnancy and has Type 1 diabetes mellitus with complications ([place]; HSV-2 infection; Anxiety; Supervision of high risk pregnancy, antepartum; Increased anion gap metabolic acidosis; Hyperemesis gravidarum; and Maternal care for poor fetal growth in second trimester on their problem list.  Patient doing well with no acute concerns today. She reports  some continued difficulty eating, but is keeping down most liquids .  Contractions: Not present. Vag. Bleeding: None.  Movement: Present. Denies leaking of fluid. Pt was seen over the weekend but was discharged home as she was not in DKA and was still making urine.  The following portions of the patient's history were reviewed and updated as appropriate: allergies, current medications, past family history, past medical history, past social history, past surgical history and problem list. Problem list updated.  Objective:   Vitals:   07/20/22 1514  BP: 113/78  Pulse: (!) 121  Weight: 113 lb 4.8 oz (51.4 kg)    Fetal Status: Fetal Heart Rate (bpm): 145 (Simultaneous filing. User may not have seen previous data.) Fundal Height: 21 cm Movement: Present     General:  Alert, oriented and cooperative. Patient is in no acute distress.  Skin: Skin is warm and dry. No rash noted.   Cardiovascular: Normal heart rate noted  Respiratory: Normal respiratory effort, no problems with respiration noted  Abdomen: Soft, gravid, appropriate for gestational age.  Pain/Pressure: Present     Pelvic: Cervical exam deferred        Extremities: Normal range of motion.  Edema: None  Mental Status:  Normal mood and affect. Normal behavior. Normal judgment and thought content.   Assessment and Plan:  Pregnancy: G1P0 at 223w0d1. [redacted] weeks gestation of pregnancy   2.  Hyperemesis gravidarum Pt has scopolamine patch as well as scheduled phenergan and reglan.  Pt also has rx for protonix. - feeding supplement (ENSURE ENLIVE / ENSURE PLUS) LIQD; Take 237 mLs by mouth 2 (two) times daily between meals.  Dispense: 237 mL; Refill: 238  3. Type 1 diabetes mellitus with complications (HCC) Pt continues with lantus 25 units at night with 10 units of humalog with each meal. Pt will be converting to omnipod on 08/01/22  - [place] CORD DOPPLER; Future - feeding supplement (ENSURE ENLIVE / ENSURE PLUS) LIQD; Take 237 mLs by mouth 2 (two) times daily between meals.  Dispense: 237 mL; Refill: 238  4. Supervision of high risk pregnancy, antepartum Continue routine prenatal care  - [place] UA CORD DOPPLER; Future  5. HSV-2 infection Prophylaxis at 36 weeks  6. Maternal care for poor fetal growth in second trimester, single or unspecified fetus Per MFM will add fetal dopplers at 25-26 weeks and fetal testing at the same time Growth and detailed scan on 08/01/22  Preterm labor symptoms and general obstetric precautions including but not limited to vaginal bleeding, contractions, leaking of fluid and fetal movement were reviewed in detail with the patient.  Please refer to After Visit Summary for other counseling recommendations.   Return in about 3 weeks (around 08/10/2022) [facility]   LaLynnda ShieldsMD Faculty Attending Center [facility]

## 2022-07-20 NOTE — Progress Notes (Signed)
HROB @ 23 wks Hyperemesis gravidarum Type I DM Visit to MAU for hydration/ nausea/ vomiting and DKA labs. D/Cd home/ no DKA/ new nausea meds/ tolerating POs. Reports only had one cup of water today and an apple. Encouraged keeping water with her to sip on and high protein snacks and veggie with fiber.

## 2022-07-21 ENCOUNTER — Other Ambulatory Visit: Payer: Self-pay | Admitting: *Deleted

## 2022-07-21 ENCOUNTER — Encounter (HOSPITAL_COMMUNITY): Payer: Self-pay | Admitting: Obstetrics and Gynecology

## 2022-07-21 ENCOUNTER — Encounter: Payer: Self-pay | Admitting: Nurse Practitioner

## 2022-07-21 ENCOUNTER — Inpatient Hospital Stay (HOSPITAL_COMMUNITY)
Admission: AD | Admit: 2022-07-21 | Discharge: 2022-07-21 | Disposition: A | Discharge status: Home / Self Care | Payer: Medicaid Other | Attending: Obstetrics and Gynecology | Admitting: Obstetrics and Gynecology

## 2022-07-21 ENCOUNTER — Ambulatory Visit (INDEPENDENT_AMBULATORY_CARE_PROVIDER_SITE_OTHER): Payer: Medicaid Other | Admitting: Nurse Practitioner

## 2022-07-21 VITALS — BP 91/62 | HR 128 | Ht 67.0 in | Wt 114.0 lb

## 2022-07-21 DIAGNOSIS — M549 Dorsalgia, unspecified: Secondary | ICD-10-CM

## 2022-07-21 DIAGNOSIS — Z3A23 23 weeks gestation of pregnancy: Secondary | ICD-10-CM

## 2022-07-21 DIAGNOSIS — E109 Type 1 diabetes mellitus without complications: Secondary | ICD-10-CM | POA: Diagnosis not present

## 2022-07-21 DIAGNOSIS — K5900 Constipation, unspecified: Secondary | ICD-10-CM | POA: Diagnosis not present

## 2022-07-21 DIAGNOSIS — O0990 Supervision of high risk pregnancy, unspecified, unspecified trimester: Secondary | ICD-10-CM

## 2022-07-21 DIAGNOSIS — O24012 Pre-existing diabetes mellitus, type 1, in pregnancy, second trimester: Secondary | ICD-10-CM | POA: Insufficient documentation

## 2022-07-21 DIAGNOSIS — Z87891 Personal history of nicotine dependence: Secondary | ICD-10-CM | POA: Insufficient documentation

## 2022-07-21 DIAGNOSIS — M545 Low back pain, unspecified: Secondary | ICD-10-CM | POA: Insufficient documentation

## 2022-07-21 DIAGNOSIS — Z8249 Family history of ischemic heart disease and other diseases of the circulatory system: Secondary | ICD-10-CM | POA: Insufficient documentation

## 2022-07-21 DIAGNOSIS — O210 Mild hyperemesis gravidarum: Secondary | ICD-10-CM | POA: Diagnosis not present

## 2022-07-21 DIAGNOSIS — E108 Type 1 diabetes mellitus with unspecified complications: Secondary | ICD-10-CM | POA: Diagnosis not present

## 2022-07-21 DIAGNOSIS — Z833 Family history of diabetes mellitus: Secondary | ICD-10-CM | POA: Insufficient documentation

## 2022-07-21 DIAGNOSIS — O0992 Supervision of high risk pregnancy, unspecified, second trimester: Secondary | ICD-10-CM

## 2022-07-21 DIAGNOSIS — O99612 Diseases of the digestive system complicating pregnancy, second trimester: Secondary | ICD-10-CM | POA: Insufficient documentation

## 2022-07-21 DIAGNOSIS — O26892 Other specified pregnancy related conditions, second trimester: Secondary | ICD-10-CM | POA: Diagnosis present

## 2022-07-21 DIAGNOSIS — R000 Tachycardia, unspecified: Secondary | ICD-10-CM | POA: Diagnosis not present

## 2022-07-21 DIAGNOSIS — Z794 Long term (current) use of insulin: Secondary | ICD-10-CM

## 2022-07-21 DIAGNOSIS — O99342 Other mental disorders complicating pregnancy, second trimester: Secondary | ICD-10-CM | POA: Insufficient documentation

## 2022-07-21 DIAGNOSIS — F419 Anxiety disorder, unspecified: Secondary | ICD-10-CM | POA: Insufficient documentation

## 2022-07-21 DIAGNOSIS — R112 Nausea with vomiting, unspecified: Secondary | ICD-10-CM | POA: Diagnosis not present

## 2022-07-21 LAB — GLUCOSE, CAPILLARY
Glucose-Capillary: 142 mg/dL — ABNORMAL HIGH (ref 70–99)
Glucose-Capillary: 277 mg/dL — ABNORMAL HIGH (ref 70–99)

## 2022-07-21 LAB — URINALYSIS, ROUTINE W REFLEX MICROSCOPIC
Bilirubin Urine: NEGATIVE
Glucose, UA: NEGATIVE mg/dL
Hgb urine dipstick: NEGATIVE
Ketones, ur: 80 mg/dL — AB
Nitrite: NEGATIVE
Protein, ur: 100 mg/dL — AB
Specific Gravity, Urine: 1.026 (ref 1.005–1.030)
pH: 6 (ref 5.0–8.0)

## 2022-07-21 MED ORDER — CYCLOBENZAPRINE HCL 5 MG PO TABS
5.0000 mg | ORAL_TABLET | Freq: Once | ORAL | Status: AC
  Administered 2022-07-21: 5 mg via ORAL
  Filled 2022-07-21: qty 1

## 2022-07-21 MED ORDER — LACTATED RINGERS IV BOLUS
1000.0000 mL | Freq: Once | INTRAVENOUS | Status: AC
  Administered 2022-07-21: 1000 mL via INTRAVENOUS

## 2022-07-21 MED ORDER — PROMETHAZINE HCL 25 MG PO TABS
25.0000 mg | ORAL_TABLET | Freq: Once | ORAL | Status: AC
  Administered 2022-07-21: 25 mg via ORAL
  Filled 2022-07-21: qty 1

## 2022-07-21 MED ORDER — SCOPOLAMINE 1 MG/3DAYS TD PT72
1.0000 | MEDICATED_PATCH | Freq: Once | TRANSDERMAL | Status: DC
  Administered 2022-07-21: 1.5 mg via TRANSDERMAL
  Filled 2022-07-21: qty 1

## 2022-07-21 MED ORDER — ACCU-CHEK AVIVA PLUS VI STRP: ORAL_STRIP | 11 refills | Status: DC

## 2022-07-21 MED ORDER — INSULIN LISPRO 100 UNIT/ML IJ SOLN: 8.0000 [IU] | Freq: Once | INTRAMUSCULAR | 3 refills | Status: DC

## 2022-07-21 MED ORDER — ACETAMINOPHEN 500 MG PO TABS
1000.0000 mg | ORAL_TABLET | Freq: Once | ORAL | Status: AC
  Administered 2022-07-21: 1000 mg via ORAL
  Filled 2022-07-21: qty 2

## 2022-07-21 MED ORDER — METOCLOPRAMIDE HCL 10 MG PO TABS
10.0000 mg | ORAL_TABLET | Freq: Once | ORAL | Status: AC
  Administered 2022-07-21: 10 mg via ORAL
  Filled 2022-07-21: qty 1

## 2022-07-21 MED ORDER — ACCU-CHEK AVIVA PLUS W/DEVICE KIT: PACK | 0 refills | Status: DC

## 2022-07-21 NOTE — MAU Note (Signed)
.  Sabrina Sutton is a 26 y.o. at 69w1dhere in MAU arriving via EMS reporting continued nausea and vomiting. Pt reports she was eval here x 2 in the last week for same. Reports she now has had lower back pain since this am. Denies dysuria. Denies vaginal bleeding . Reports positive fetal movement Onset of complaint: today.  Pain score: 9/10 There were no vitals filed for this visit.   FHT:146 Lab orders placed from triage
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
## 2022-07-21 NOTE — Progress Notes (Signed)
WIC form for Ensure/ Boost supplement completed and faxed.

## 2022-07-27 ENCOUNTER — Telehealth: Payer: Self-pay | Admitting: Emergency Medicine

## 2022-07-27 ENCOUNTER — Other Ambulatory Visit: Payer: Self-pay

## 2022-07-27 ENCOUNTER — Encounter: Payer: Self-pay | Admitting: Nurse Practitioner

## 2022-07-27 ENCOUNTER — Encounter: Payer: Self-pay | Admitting: Certified Nurse Midwife

## 2022-07-27 DIAGNOSIS — E108 Type 1 diabetes mellitus with unspecified complications: Secondary | ICD-10-CM

## 2022-07-27 MED ORDER — ONETOUCH VERIO FLEX SYSTEM W/DEVICE KIT: PACK | 0 refills | Status: DC

## 2022-07-27 NOTE — Telephone Encounter (Signed)
TC from patient reporting uterine cramping, decreased fetal movement. Reports severe constipation and nausea and vomiting. Instructed to present to MAU for decreased fetal movement and abdominal pain.

## 2022-07-31 ENCOUNTER — Other Ambulatory Visit: Payer: Self-pay | Admitting: Emergency Medicine

## 2022-07-31 MED ORDER — ACCU-CHEK GUIDE VI STRP: ORAL_STRIP | 6 refills | Status: DC

## 2022-07-31 NOTE — Addendum Note (Signed)
Addended by: Brita Romp on: 07/31/2022 09:29 AM   Modules accepted: Orders

## 2022-08-01 ENCOUNTER — Other Ambulatory Visit: Payer: Self-pay | Admitting: *Deleted

## 2022-08-01 ENCOUNTER — Other Ambulatory Visit: Payer: Self-pay

## 2022-08-01 ENCOUNTER — Ambulatory Visit (INDEPENDENT_AMBULATORY_CARE_PROVIDER_SITE_OTHER): Payer: Medicaid Other | Admitting: Registered"

## 2022-08-01 ENCOUNTER — Ambulatory Visit: Payer: Medicaid Other | Admitting: *Deleted

## 2022-08-01 ENCOUNTER — Encounter: Payer: Self-pay | Admitting: *Deleted

## 2022-08-01 ENCOUNTER — Other Ambulatory Visit: Payer: Self-pay | Admitting: Certified Nurse Midwife

## 2022-08-01 ENCOUNTER — Ambulatory Visit: Payer: Medicaid Other | Attending: Maternal & Fetal Medicine

## 2022-08-01 VITALS — BP 103/65 | HR 105

## 2022-08-01 DIAGNOSIS — O0992 Supervision of high risk pregnancy, unspecified, second trimester: Secondary | ICD-10-CM

## 2022-08-01 DIAGNOSIS — E108 Type 1 diabetes mellitus with unspecified complications: Secondary | ICD-10-CM | POA: Diagnosis present

## 2022-08-01 DIAGNOSIS — E109 Type 1 diabetes mellitus without complications: Secondary | ICD-10-CM | POA: Insufficient documentation

## 2022-08-01 DIAGNOSIS — O24012 Pre-existing diabetes mellitus, type 1, in pregnancy, second trimester: Secondary | ICD-10-CM | POA: Diagnosis not present

## 2022-08-01 DIAGNOSIS — O24019 Pre-existing diabetes mellitus, type 1, in pregnancy, unspecified trimester: Secondary | ICD-10-CM

## 2022-08-01 DIAGNOSIS — F419 Anxiety disorder, unspecified: Secondary | ICD-10-CM | POA: Insufficient documentation

## 2022-08-01 DIAGNOSIS — O365920 Maternal care for other known or suspected poor fetal growth, second trimester, not applicable or unspecified: Secondary | ICD-10-CM

## 2022-08-01 DIAGNOSIS — O0990 Supervision of high risk pregnancy, unspecified, unspecified trimester: Secondary | ICD-10-CM

## 2022-08-01 DIAGNOSIS — E8729 Other acidosis: Secondary | ICD-10-CM

## 2022-08-01 DIAGNOSIS — Z3A24 24 weeks gestation of pregnancy: Secondary | ICD-10-CM | POA: Diagnosis not present

## 2022-08-01 DIAGNOSIS — E1010 Type 1 diabetes mellitus with ketoacidosis without coma: Secondary | ICD-10-CM

## 2022-08-01 DIAGNOSIS — O365990 Maternal care for other known or suspected poor fetal growth, unspecified trimester, not applicable or unspecified: Secondary | ICD-10-CM

## 2022-08-01 NOTE — Progress Notes (Unsigned)
Start time:  0935   End time:  1100  Patient is here today 08/01/22 for training on the Omnipod 5 Insulin Pump.  It was confirmed that the patient was aware of the following: Blood glucose (BG) testing Treating hypoglycemia Hyperglycemia Carbohydrate counting   (Patient will be using carbohydrate counting with use of this pump.) Sick day management  Patient was instructed on the following: Have the following supplies available to be prepared: Omnipod PDM and pods Insulin and syringe Blood glucose meter, strips, lancets, lancing device Glucose tablets/fast acting source of carbohydrate/Glucagon  Supply Reorder [place] [phone] Reorder - info/contact number When to reorder (when last box of pods is opened)  System Overview Communication process/distance Storage guidelines Diagnostic tests (CT scans/MRI/Xrays) Travel guidelines  Pod: Fill port/adhesive/needle cap/pink slide insert/Waterproof  PDM Battery/button layout/wireless updates (software updates)  PDM Settings Pump Therapy Order Form with settings below Basic settings - Personalized lock screen/time/time zone/date/date format Basal settings Max basal 3 Basal rates 0.7 Temp basal   Bolus settings Target Blood glucose  110 Insulin to carb (IC) ratio  13 Correction factor 50 mg/dL/unit for BG above 110 mg/dL Minimum blood glucose for bolus calculations 70 mg/dL Reverse correction Duration of insulin action 3 hrs Extended bolus Max bolus Patient was able to accurately enter pump settings into PDM.  Pod Activation Change Pod  Room temperature insulin Fill syringe - min/max amounts DO NOT prefill Pod Site selection/rotation & prep Automated cannula insertion - check infusion site/viewing window & pink slide insert Blood glucose reminder 1.5 hours after insertion When to change POD Patient was able to place pod and view insert.  Home Screen Status Bar, Menu Icon,  Notification/Alarms Tabs Dashboard - IOB (if Calculator ON) - Instructed to leave calculator on Basal (Temp Basal if Temp Basal running) Pod Info - View Pod detail Last Bolus, Last BG Bolus button  Menu icon PDM function - Temp Basal, Pod, Enter BG, Suspend Manage Programs & Presets - Basal programs, temp basal presets, bolus presets Food Library ([employer] app, other tools for carbohydrate counting) History - Notifications & Alarms, Insulin & BG History Settings - PDM Device, Pod sites, Reminders, Blood Glucose, Basal & Temp Basal, Bolus About  Advanced Features (to be discussed at a further date based on patient needs) Extended bolus Temp basal rate Additional basal programs Presets - Temp basal/Bolus presets  Troubleshooting Hypoglycemia Sick day management resource Hyperglycemia & Ketones  Notifications & Alarms Custom reminders Pod Expiration alert [place] alert  Advisory & Hazard Alarms Advisory alarms - intermittent tones - response required Hazard alarm - Continuous vibration, and Tone - urgent attention required - Pod Expired, Empty [place], [place], Pod Error, Auto Off, PDM Error, System Error  Ongoing Success [place] invitation provided Omnipod app(s) Register for [employer] 24/7 Customer Care  [phone] Reviewed User Guide Showed patient Customer's Bill of Rights and Responsibilities and instructed them to review.  Handouts Provided: Insulin Pump Packet  Patient to contact me via MyChart or phone.

## 2022-08-03 ENCOUNTER — Inpatient Hospital Stay (HOSPITAL_COMMUNITY)
Admission: AD | Admit: 2022-08-03 | Discharge: 2022-08-03 | Disposition: A | Discharge status: Home / Self Care | Payer: Medicaid Other | Attending: Obstetrics and Gynecology | Admitting: Obstetrics and Gynecology

## 2022-08-03 ENCOUNTER — Other Ambulatory Visit: Payer: Self-pay

## 2022-08-03 ENCOUNTER — Encounter (HOSPITAL_COMMUNITY): Payer: Self-pay | Admitting: Obstetrics and Gynecology

## 2022-08-03 DIAGNOSIS — O26892 Other specified pregnancy related conditions, second trimester: Secondary | ICD-10-CM | POA: Insufficient documentation

## 2022-08-03 DIAGNOSIS — O24112 Pre-existing diabetes mellitus, type 2, in pregnancy, second trimester: Secondary | ICD-10-CM | POA: Insufficient documentation

## 2022-08-03 DIAGNOSIS — Z3A25 25 weeks gestation of pregnancy: Secondary | ICD-10-CM | POA: Insufficient documentation

## 2022-08-03 DIAGNOSIS — O212 Late vomiting of pregnancy: Secondary | ICD-10-CM | POA: Diagnosis not present

## 2022-08-03 DIAGNOSIS — M549 Dorsalgia, unspecified: Secondary | ICD-10-CM | POA: Diagnosis not present

## 2022-08-03 DIAGNOSIS — O99891 Other specified diseases and conditions complicating pregnancy: Secondary | ICD-10-CM | POA: Diagnosis not present

## 2022-08-03 DIAGNOSIS — M545 Low back pain, unspecified: Secondary | ICD-10-CM | POA: Diagnosis not present

## 2022-08-03 LAB — URINALYSIS, ROUTINE W REFLEX MICROSCOPIC
Bilirubin Urine: NEGATIVE
Glucose, UA: NEGATIVE mg/dL
Hgb urine dipstick: NEGATIVE
Ketones, ur: 20 mg/dL — AB
Leukocytes,Ua: NEGATIVE
Nitrite: NEGATIVE
Protein, ur: NEGATIVE mg/dL
Specific Gravity, Urine: 1.021 (ref 1.005–1.030)
pH: 7 (ref 5.0–8.0)

## 2022-08-03 MED ORDER — CYCLOBENZAPRINE HCL 5 MG PO TABS
10.0000 mg | ORAL_TABLET | Freq: Once | ORAL | Status: AC
  Administered 2022-08-03: 10 mg via ORAL
  Filled 2022-08-03: qty 2

## 2022-08-03 MED ORDER — ACETAMINOPHEN 500 MG PO TABS
500.0000 mg | ORAL_TABLET | Freq: Once | ORAL | Status: AC
  Administered 2022-08-03: 500 mg via ORAL
  Filled 2022-08-03: qty 1

## 2022-08-03 MED ORDER — CYCLOBENZAPRINE HCL 10 MG PO TABS: 10.0000 mg | ORAL_TABLET | Freq: Three times a day (TID) | ORAL | 0 refills | Status: DC | PRN
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
## 2022-08-03 NOTE — MAU Note (Signed)
Pt arrived via EMS with c/o of right side and lower back pain since 0800 08/02/22. Pt states pain came on suddenly while asleep. Pain intermittent ache rating 7/10. Pt took tylenol this AM with no relief. Denies bleeding, LOF or recent sexual intercourse

## 2022-08-03 NOTE — Discharge Instructions (Signed)
   PREGNANCY SUPPORT BELT: You are not alone, Seventy-five percent of women have some sort of abdominal or back pain at some point in their pregnancy. Your baby is growing at a fast pace, which means that your whole body is rapidly trying to adjust to the changes. As your uterus grows, your back may start feeling a bit under stress and this can result in back or abdominal pain that can go from mild, and therefore bearable, to severe pains that will not allow you to sit or lay down comfortably, When it comes to dealing with pregnancy-related pains and cramps, some pregnant women usually prefer natural remedies, which the market is filled with nowadays. For example, wearing a pregnancy support belt can help ease and lessen your discomfort and pain. WHAT ARE THE BENEFITS OF WEARING A PREGNANCY SUPPORT BELT? A pregnancy support belt provides support to the lower portion of the belly taking some of the weight of the growing uterus and distributing to the other parts of your body. It is designed make you comfortable and gives you extra support. Over the years, the pregnancy apparel market has been studying the needs and wants of pregnant women and they have come up with the most comfortable pregnancy support belts that woman could ever ask for. In fact, you will no longer have to wear a stretched-out or bulky pregnancy belt that is visible underneath your clothes and makes you feel even more uncomfortable. Nowadays, a pregnancy support belt is made of comfortable and stretchy materials that will not irritate your skin but will actually make you feel at ease and you will not even notice you are wearing it. They are easy to put on and adjust during the day and can be worn at night for additional support.  BENEFITS: Relives Back pain Relieves Abdominal Muscle and Leg Pain Stabilizes the Pelvic Ring Offers a Cushioned Abdominal Lift Pad Relieves pressure on the Sciatic Nerve Within Minutes    Locations that Carry  Maternity/Pregnancy Support Belts  You can find belts on [email or web address] starting at $10-$15.  2.  [employer] carries belts for $10-$15.        Address/Phone: [address], [address], [place], [place] [address], [phone]  3.  You may be able to file your insurance with [email or web address] and have a belt mailed to you.  If you have any problems getting the belt, let your [facility] office know.

## 2022-08-04 ENCOUNTER — Telehealth: Payer: Self-pay | Admitting: Registered"

## 2022-08-04 NOTE — Telephone Encounter (Signed)
Contacted patient regarding glucose readings on [employer]. Pt states she sometimes forgets to bolus for meals. Pt had some questions about when to change pod, discussed making decision pod expiration and amount of insulin left in pod. Told patient to contact me if she has issues changing pod. Pt is scheduled for follow-up visit Jan 18.

## 2022-08-07 ENCOUNTER — Encounter: Payer: Self-pay | Admitting: *Deleted

## 2022-08-08 ENCOUNTER — Ambulatory Visit: Payer: Medicaid Other | Admitting: *Deleted

## 2022-08-08 ENCOUNTER — Ambulatory Visit: Payer: Medicaid Other | Attending: Obstetrics

## 2022-08-08 ENCOUNTER — Telehealth (HOSPITAL_BASED_OUTPATIENT_CLINIC_OR_DEPARTMENT_OTHER): Payer: Self-pay | Admitting: Advanced Practice Midwife

## 2022-08-08 ENCOUNTER — Ambulatory Visit (INDEPENDENT_AMBULATORY_CARE_PROVIDER_SITE_OTHER): Payer: Medicaid Other | Admitting: Advanced Practice Midwife

## 2022-08-08 VITALS — BP 106/66 | HR 96

## 2022-08-08 VITALS — BP 107/68 | HR 103 | Wt 124.2 lb

## 2022-08-08 DIAGNOSIS — Z3A25 25 weeks gestation of pregnancy: Secondary | ICD-10-CM

## 2022-08-08 DIAGNOSIS — O365990 Maternal care for other known or suspected poor fetal growth, unspecified trimester, not applicable or unspecified: Secondary | ICD-10-CM | POA: Diagnosis present

## 2022-08-08 DIAGNOSIS — O0990 Supervision of high risk pregnancy, unspecified, unspecified trimester: Secondary | ICD-10-CM

## 2022-08-08 DIAGNOSIS — E109 Type 1 diabetes mellitus without complications: Secondary | ICD-10-CM

## 2022-08-08 DIAGNOSIS — E8729 Other acidosis: Secondary | ICD-10-CM | POA: Diagnosis present

## 2022-08-08 DIAGNOSIS — O24012 Pre-existing diabetes mellitus, type 1, in pregnancy, second trimester: Secondary | ICD-10-CM | POA: Diagnosis not present

## 2022-08-08 DIAGNOSIS — F419 Anxiety disorder, unspecified: Secondary | ICD-10-CM

## 2022-08-08 DIAGNOSIS — O24019 Pre-existing diabetes mellitus, type 1, in pregnancy, unspecified trimester: Secondary | ICD-10-CM | POA: Diagnosis not present

## 2022-08-08 DIAGNOSIS — Z23 Encounter for immunization: Secondary | ICD-10-CM

## 2022-08-08 DIAGNOSIS — O0992 Supervision of high risk pregnancy, unspecified, second trimester: Secondary | ICD-10-CM

## 2022-08-08 DIAGNOSIS — E108 Type 1 diabetes mellitus with unspecified complications: Secondary | ICD-10-CM

## 2022-08-08 DIAGNOSIS — O365920 Maternal care for other known or suspected poor fetal growth, second trimester, not applicable or unspecified: Secondary | ICD-10-CM | POA: Diagnosis not present

## 2022-08-08 NOTE — Telephone Encounter (Signed)
Called pt to review NIPS testing, and verify that pt would still like to do the panel, since it was not drawn early in pregnancy.  Pt does desire NIPS and carrier screening so message sent to schedule her for lab visit this week.

## 2022-08-08 NOTE — Progress Notes (Signed)
Pt reports fetal movement, denies pan. Reports fasting BG as 120 today.

## 2022-08-08 NOTE — Progress Notes (Signed)
   PRENATAL VISIT NOTE  Subjective:  Sabrina Sutton is a 27 y.o. G1P0 at 30w5dbeing seen today for ongoing prenatal care.  She is currently monitored for the following issues for this high-risk pregnancy and has Type 1 diabetes mellitus with complications ([place]; HSV-2 infection; Anxiety; Supervision of high risk pregnancy, antepartum; Increased anion gap metabolic acidosis; Hyperemesis gravidarum; and Maternal care for poor fetal growth in second trimester on their problem list.  Patient reports no complaints.  Contractions: Not present. Vag. Bleeding: None.  Movement: Present. Denies leaking of fluid.   The following portions of the patient's history were reviewed and updated as appropriate: allergies, current medications, past family history, past medical history, past social history, past surgical history and problem list.   Objective:   Vitals:   08/08/22 1441  BP: 107/68  Pulse: (!) 103  Weight: 124 lb 3.2 oz (56.3 kg)    Fetal Status: Fetal Heart Rate (bpm): 140 Fundal Height: 25 cm Movement: Present     General:  Alert, oriented and cooperative. Patient is in no acute distress.  Skin: Skin is warm and dry. No rash noted.   Cardiovascular: Normal heart rate noted  Respiratory: Normal respiratory effort, no problems with respiration noted  Abdomen: Soft, gravid, appropriate for gestational age.  Pain/Pressure: Present     Pelvic: Cervical exam deferred        Extremities: Normal range of motion.  Edema: None  Mental Status: Normal mood and affect. Normal behavior. Normal judgment and thought content.   Assessment and Plan:  Pregnancy: G1P0 at 212w5d. Supervision of high risk pregnancy, antepartum --Anticipatory guidance about next visits/weeks of pregnancy given.  --NIPS not collected, unable to draw and pt never rescheduled/drawn.  Pt still desires NIPS so to return this week to complete [facility].   2. Type 1 diabetes mellitus with complications  (HCC) --Glucose values better controlled with Dexcom monitor per pt. Fasting today 120, lower than her usual values.  --F/U with endocrinolgy, continue current care.  3. [redacted] weeks gestation of pregnancy   Preterm labor symptoms and general obstetric precautions including but not limited to vaginal bleeding, contractions, leaking of fluid and fetal movement were reviewed in detail with the patient. Please refer to After Visit Summary for other counseling recommendations.   Return in about 2 weeks (around 08/22/2022) for HROB.  Future Appointments  Date Time Provider DeEpworth1/18/2024  1:15 PM [facility]1/22/2024  2:15 PM WMC-MFC NURSE [facility]1/22/2024  2:30 PM WMC-MFC US3 [facility]/29/2024  2:15 PM [facility] NURSE [facility]1/29/2024  2:30 PM WMC-MFC US3 [facility]1/30/2024  3:00 PM ReBrita RompNP REA-REA None  08/28/2022  2:15 PM WMC-MFC NURSE [facility]2/11/2022  2:30 PM WMC-MFC US3 WMC-MFCUS WMU[facility]2/11/2022  3:15 PM WMC-MFC NST [facility]MFC [facility]MM[facility]2/06/2023  2:00 PM WMC-MFC NURSE [facility]MFC [facility]2/06/2023  2:15 PM WMC-MFC NST WMC-MFC WMC    LiFatima BlankCNM

## 2022-08-09 ENCOUNTER — Telehealth: Payer: Self-pay | Admitting: *Deleted

## 2022-08-09 ENCOUNTER — Other Ambulatory Visit: Payer: Medicaid Other

## 2022-08-09 ENCOUNTER — Encounter (HOSPITAL_COMMUNITY): Payer: Self-pay | Admitting: Obstetrics and Gynecology

## 2022-08-09 ENCOUNTER — Inpatient Hospital Stay (HOSPITAL_COMMUNITY)
Admission: AD | Admit: 2022-08-09 | Discharge: 2022-08-09 | Disposition: A | Discharge status: Home / Self Care | Payer: Medicaid Other | Attending: Obstetrics and Gynecology | Admitting: Obstetrics and Gynecology

## 2022-08-09 ENCOUNTER — Inpatient Hospital Stay (HOSPITAL_BASED_OUTPATIENT_CLINIC_OR_DEPARTMENT_OTHER): Payer: Medicaid Other

## 2022-08-09 ENCOUNTER — Other Ambulatory Visit: Payer: Self-pay

## 2022-08-09 DIAGNOSIS — O26892 Other specified pregnancy related conditions, second trimester: Secondary | ICD-10-CM

## 2022-08-09 DIAGNOSIS — O4700 False labor before 37 completed weeks of gestation, unspecified trimester: Secondary | ICD-10-CM

## 2022-08-09 DIAGNOSIS — E119 Type 2 diabetes mellitus without complications: Secondary | ICD-10-CM | POA: Diagnosis not present

## 2022-08-09 DIAGNOSIS — F419 Anxiety disorder, unspecified: Secondary | ICD-10-CM

## 2022-08-09 DIAGNOSIS — O365920 Maternal care for other known or suspected poor fetal growth, second trimester, not applicable or unspecified: Secondary | ICD-10-CM

## 2022-08-09 DIAGNOSIS — O24112 Pre-existing diabetes mellitus, type 2, in pregnancy, second trimester: Secondary | ICD-10-CM | POA: Diagnosis not present

## 2022-08-09 DIAGNOSIS — R109 Unspecified abdominal pain: Secondary | ICD-10-CM | POA: Diagnosis not present

## 2022-08-09 DIAGNOSIS — Z794 Long term (current) use of insulin: Secondary | ICD-10-CM | POA: Diagnosis not present

## 2022-08-09 DIAGNOSIS — Z3A25 25 weeks gestation of pregnancy: Secondary | ICD-10-CM

## 2022-08-09 DIAGNOSIS — O0990 Supervision of high risk pregnancy, unspecified, unspecified trimester: Secondary | ICD-10-CM

## 2022-08-09 DIAGNOSIS — O368320 Maternal care for abnormalities of the fetal heart rate or rhythm, second trimester, not applicable or unspecified: Secondary | ICD-10-CM | POA: Diagnosis not present

## 2022-08-09 DIAGNOSIS — E8729 Other acidosis: Secondary | ICD-10-CM

## 2022-08-09 DIAGNOSIS — O365990 Maternal care for other known or suspected poor fetal growth, unspecified trimester, not applicable or unspecified: Secondary | ICD-10-CM | POA: Insufficient documentation

## 2022-08-09 DIAGNOSIS — O479 False labor, unspecified: Secondary | ICD-10-CM | POA: Diagnosis not present

## 2022-08-09 DIAGNOSIS — O26899 Other specified pregnancy related conditions, unspecified trimester: Secondary | ICD-10-CM | POA: Diagnosis not present

## 2022-08-09 HISTORY — DX: Maternal care for other known or suspected poor fetal growth, unspecified trimester, not applicable or unspecified: O36.5990

## 2022-08-09 LAB — URINALYSIS, ROUTINE W REFLEX MICROSCOPIC
Bilirubin Urine: NEGATIVE
Glucose, UA: NEGATIVE mg/dL
Hgb urine dipstick: NEGATIVE
Ketones, ur: NEGATIVE mg/dL
Leukocytes,Ua: NEGATIVE
Nitrite: NEGATIVE
Protein, ur: NEGATIVE mg/dL
Specific Gravity, Urine: 1.015 (ref 1.005–1.030)
pH: 7 (ref 5.0–8.0)

## 2022-08-09 LAB — COMPREHENSIVE METABOLIC PANEL
ALT: 10 U/L (ref 0–44)
AST: 17 U/L (ref 15–41)
Albumin: 3 g/dL — ABNORMAL LOW (ref 3.5–5.0)
Alkaline Phosphatase: 56 U/L (ref 38–126)
Anion gap: 9 (ref 5–15)
BUN: 9 mg/dL (ref 6–20)
CO2: 23 mmol/L (ref 22–32)
Calcium: 9.1 mg/dL (ref 8.9–10.3)
Chloride: 100 mmol/L (ref 98–111)
Creatinine, Ser: 0.62 mg/dL (ref 0.44–1.00)
GFR, Estimated: >60 mL/min (ref >60)
Glucose, Bld: 167 mg/dL — ABNORMAL HIGH (ref 70–99)
Potassium: 3.9 mmol/L (ref 3.5–5.1)
Sodium: 132 mmol/L — ABNORMAL LOW (ref 135–145)
Total Bilirubin: 0.2 mg/dL — ABNORMAL LOW (ref 0.3–1.2)
Total Protein: 6.3 g/dL — ABNORMAL LOW (ref 6.5–8.1)

## 2022-08-09 LAB — CBC
HCT: 31.2 % — ABNORMAL LOW (ref 36.0–46.0)
Hemoglobin: 10.4 g/dL — ABNORMAL LOW (ref 12.0–15.0)
MCH: 29.3 pg (ref 26.0–34.0)
MCHC: 33.3 g/dL (ref 30.0–36.0)
MCV: 87.9 fL (ref 80.0–100.0)
Platelets: 302 10*3/uL (ref 150–400)
RBC: 3.55 MIL/uL — ABNORMAL LOW (ref 3.87–5.11)
RDW: 13.3 % (ref 11.5–15.5)
WBC: 10.5 10*3/uL (ref 4.0–10.5)
nRBC: 0 % (ref 0.0–0.2)

## 2022-08-09 LAB — TYPE AND SCREEN
ABO/RH(D): O POS
Antibody Screen: NEGATIVE

## 2022-08-09 MED ORDER — LACTATED RINGERS IV BOLUS
1000.0000 mL | Freq: Once | INTRAVENOUS | Status: AC
  Administered 2022-08-09: 1000 mL via INTRAVENOUS

## 2022-08-09 NOTE — Telephone Encounter (Signed)
TC from pt reporting severe abdominal cramping. Reports "I think I am having contractions." Pt having difficulty talking through pain. Advised to seek care in MAU immediately. Instructed on location. Pt verbalized understanding.

## 2022-08-09 NOTE — Progress Notes (Signed)
Failed IV attempt by V. Caryn Section, RN.

## 2022-08-09 NOTE — Telephone Encounter (Signed)
See previous telephone encounter note.
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
## 2022-08-09 NOTE — MAU Note (Signed)
Sabrina Sutton is a 27 y.o. at 66w6dhere in MAU via EMS reporting: she having ctxs that began this morning @ 0930.  Reports pain is intermittent and abdomen is tightening.  Denies VB or LOF.  Endorses +FM. LMP: NA Onset of complaint: 0930 Pain score: 9 Vitals:   08/09/22 1046  BP: 102/68  Pulse: 92  Resp: 18  Temp: 98 F (36.7 C)  SpO2: 100%     FHT:140 Lab orders placed from triage:   UA

## 2022-08-09 NOTE — Progress Notes (Signed)
Dr. Dione Plover @ bedside during decel, pt turned into right lateral position then left lateral position d/t FHR remaining in the 80's.  Dr. Dione Plover remains @ bedside, ultrasound performed.  Pt positioned into slight left lateral tilt & FHR gradually returned to baseline after 7 minutes.

## 2022-08-10 ENCOUNTER — Ambulatory Visit (INDEPENDENT_AMBULATORY_CARE_PROVIDER_SITE_OTHER): Payer: Medicaid Other | Admitting: Registered"

## 2022-08-10 ENCOUNTER — Encounter: Payer: Medicaid Other | Attending: Obstetrics & Gynecology | Admitting: Registered"

## 2022-08-10 ENCOUNTER — Other Ambulatory Visit: Payer: Medicaid Other

## 2022-08-10 DIAGNOSIS — E108 Type 1 diabetes mellitus with unspecified complications: Secondary | ICD-10-CM | POA: Diagnosis present

## 2022-08-10 DIAGNOSIS — Z3482 Encounter for supervision of other normal pregnancy, second trimester: Secondary | ICD-10-CM | POA: Diagnosis not present

## 2022-08-10 DIAGNOSIS — Z713 Dietary counseling and surveillance: Secondary | ICD-10-CM | POA: Diagnosis not present

## 2022-08-10 NOTE — Patient Instructions (Addendum)
Upload Dexcom data to [email or web address] Look for email with invitation to share data Use the arrow on your dexcom reading to decide if you need to use correction bolus. If your blood sugar is 120 before a meal and are unsure about if your meal bolus will create hypoglycemia, you can use the extended bolus feature with a small percent to deliver immediately. Treat low blood sugar with glucose tablets or 1/2 cup juice or sweetened beverage. Wait 15-30 min and check blood sugar again to decide if you had enough to raise it or if your blood sugar is rising too quickly.

## 2022-08-10 NOTE — Progress Notes (Signed)
Patient was seen on 08/10/22 for follow-up assessment after Omnipod start   Start 1330 End 1415  EDD 11/16/22; [redacted]w[redacted]d  Review of Dexcom graph on reader indicates patient is having hypoglycemic episodes during the night. Pt states she does not get alarms to wake her up. Upon reviewing settings in receiver, sounds was set to vibrate only.   Pt correction bolus yesterday appeared to over correct. Pt states she did not use the arrow on the Dexcom reader to consider whether or not to bolus.  Patient is having some elevated PPBG, may be due to inadequate meal bolus. Pt is unsure about using meal bolus when pre-meal value is 120 or less. Pt reports appetite not predictable and may not eat the entire carb portion of her meal and does not want to get too much insulin.  The following learning objectives reviewed during follow-up visit: Clarity website to view [place]aglev[place]ettings Reviewed timing of meal bolus Extended Bolus feature Rule of 15  Insulin requirements change over time during pregnancy  Plan:  Upload Dexcom data to [email or web address] Look for email with invitation to share data Use the arrow on your dexcom reading to decide if you need to use correction bolus. If your blood sugar is 120 before a meal and are unsure about if your meal bolus will create hypoglycemia, you can use the extended bolus feature with a small percent to deliver immediately. Treat low blood sugar with glucose tablets or 1/2 cup juice or sweetened beverage. Wait 15-30 min and check blood sugar again to decide if you had enough to raise it or if your blood sugar is rising too quickly.  Patient instructed to monitor glucose levels: FBS: 70 - 95 mg/dl 2 hour: <120 mg/dl  Patient received the following handouts: Page from [employer] instruction book with Extended Bolus instructions  Patient will be seen for follow-up in 3 weeks or as needed.

## 2022-08-11 DIAGNOSIS — Z3A26 26 weeks gestation of pregnancy: Secondary | ICD-10-CM | POA: Diagnosis not present

## 2022-08-11 DIAGNOSIS — O24019 Pre-existing diabetes mellitus, type 1, in pregnancy, unspecified trimester: Secondary | ICD-10-CM | POA: Diagnosis not present

## 2022-08-11 DIAGNOSIS — O35BXX0 Maternal care for other (suspected) fetal abnormality and damage, fetal cardiac anomalies, not applicable or unspecified: Secondary | ICD-10-CM | POA: Diagnosis not present

## 2022-08-11 DIAGNOSIS — O24012 Pre-existing diabetes mellitus, type 1, in pregnancy, second trimester: Secondary | ICD-10-CM | POA: Diagnosis not present

## 2022-08-12 ENCOUNTER — Encounter (HOSPITAL_COMMUNITY): Payer: Self-pay | Admitting: Family Medicine

## 2022-08-12 ENCOUNTER — Inpatient Hospital Stay (HOSPITAL_COMMUNITY)
Admission: AD | Admit: 2022-08-12 | Discharge: 2022-08-12 | Disposition: A | Discharge status: Home / Self Care | Payer: Medicaid Other | Attending: Family Medicine | Admitting: Family Medicine

## 2022-08-12 DIAGNOSIS — O99342 Other mental disorders complicating pregnancy, second trimester: Secondary | ICD-10-CM | POA: Insufficient documentation

## 2022-08-12 DIAGNOSIS — Z79899 Other long term (current) drug therapy: Secondary | ICD-10-CM | POA: Insufficient documentation

## 2022-08-12 DIAGNOSIS — R1031 Right lower quadrant pain: Secondary | ICD-10-CM | POA: Diagnosis present

## 2022-08-12 DIAGNOSIS — R112 Nausea with vomiting, unspecified: Secondary | ICD-10-CM | POA: Diagnosis not present

## 2022-08-12 DIAGNOSIS — Z3A26 26 weeks gestation of pregnancy: Secondary | ICD-10-CM | POA: Diagnosis not present

## 2022-08-12 DIAGNOSIS — O210 Mild hyperemesis gravidarum: Secondary | ICD-10-CM | POA: Diagnosis not present

## 2022-08-12 DIAGNOSIS — O26892 Other specified pregnancy related conditions, second trimester: Secondary | ICD-10-CM | POA: Diagnosis present

## 2022-08-12 DIAGNOSIS — F419 Anxiety disorder, unspecified: Secondary | ICD-10-CM | POA: Diagnosis not present

## 2022-08-12 DIAGNOSIS — R1084 Generalized abdominal pain: Secondary | ICD-10-CM | POA: Diagnosis not present

## 2022-08-12 LAB — URINALYSIS, ROUTINE W REFLEX MICROSCOPIC
Bilirubin Urine: NEGATIVE
Glucose, UA: NEGATIVE mg/dL
Hgb urine dipstick: NEGATIVE
Ketones, ur: NEGATIVE mg/dL
Leukocytes,Ua: NEGATIVE
Nitrite: NEGATIVE
Protein, ur: 30 mg/dL — AB
Specific Gravity, Urine: 1.019 (ref 1.005–1.030)
pH: 8 (ref 5.0–8.0)

## 2022-08-12 MED ORDER — SERTRALINE HCL 50 MG PO TABS: 50.0000 mg | ORAL_TABLET | Freq: Every day | ORAL | 4 refills | Status: DC

## 2022-08-12 MED ORDER — PROMETHAZINE HCL 25 MG/ML IJ SOLN
25.0000 mg | Freq: Once | INTRAMUSCULAR | Status: AC
  Administered 2022-08-12: 25 mg via INTRAMUSCULAR
  Filled 2022-08-12: qty 1

## 2022-08-12 MED ORDER — ONDANSETRON 4 MG PO TBDP
8.0000 mg | ORAL_TABLET | Freq: Once | ORAL | Status: AC
  Administered 2022-08-12: 8 mg via ORAL
  Filled 2022-08-12: qty 2

## 2022-08-12 NOTE — MAU Note (Signed)
Patient's blood glucose level is 134 at 1407 based off of the patient's blood glucose monitor.
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
## 2022-08-12 NOTE — MAU Note (Addendum)
...  Sabrina Sutton is a 27 y.o. at 63w2dhere in MAU reporting: woke up around 0300 and began vomiting. She reports she has vomited three times since then. She reports she then began experiencing sharp right lower abdominal pain and reports this pain is intermittent. Last felt this pain around 25 minutes ago. She also endorses a HA that began around 0300 as well. Denies VB or LOF. Denies current nausea. +FM.   She is mostly concerned about her abdominal pain and her HA. She also reports being afraid due to being told yesterday that her baby has a heart murmur and the last time she experienced the pain she is experiencing in MAU, the baby's HR had dropped.  Last Meds: 0700 or 0800 - 2.5 Units from pump. Promethazine 0700 Scop patch placed yesterday  T1DM. IUGR. CBG with EMS 104.   Seen in MAU on 1/17. Patient was also experiencing the right sided abdominal pains. Patient received two bags of LR.   Onset of complaint: 0300 Pain score:  9/10 right lower abdomen 8/10 HA - frontal  FHT: 135 initial external Lab orders placed from triage:  UA

## 2022-08-12 NOTE — Discharge Instructions (Signed)
   PREGNANCY SUPPORT BELT: You are not alone, Seventy-five percent of women have some sort of abdominal or back pain at some point in their pregnancy. Your baby is growing at a fast pace, which means that your whole body is rapidly trying to adjust to the changes. As your uterus grows, your back may start feeling a bit under stress and this can result in back or abdominal pain that can go from mild, and therefore bearable, to severe pains that will not allow you to sit or lay down comfortably, When it comes to dealing with pregnancy-related pains and cramps, some pregnant women usually prefer natural remedies, which the market is filled with nowadays. For example, wearing a pregnancy support belt can help ease and lessen your discomfort and pain. WHAT ARE THE BENEFITS OF WEARING A PREGNANCY SUPPORT BELT? A pregnancy support belt provides support to the lower portion of the belly taking some of the weight of the growing uterus and distributing to the other parts of your body. It is designed make you comfortable and gives you extra support. Over the years, the pregnancy apparel market has been studying the needs and wants of pregnant women and they have come up with the most comfortable pregnancy support belts that woman could ever ask for. In fact, you will no longer have to wear a stretched-out or bulky pregnancy belt that is visible underneath your clothes and makes you feel even more uncomfortable. Nowadays, a pregnancy support belt is made of comfortable and stretchy materials that will not irritate your skin but will actually make you feel at ease and you will not even notice you are wearing it. They are easy to put on and adjust during the day and can be worn at night for additional support.  BENEFITS: Relives Back pain Relieves Abdominal Muscle and Leg Pain Stabilizes the Pelvic Ring Offers a Cushioned Abdominal Lift Pad Relieves pressure on the Sciatic Nerve Within Minutes    Locations that Carry  Maternity/Pregnancy Support Belts  You can find belts on [email or web address] starting at $10-$15.  2.  [facility] [employer] carries belts for $10-$15.        Address/Phone: [address], [address], [place], [place] [address], [phone]  3.  You may be able to file your insurance with [email or web address] and have a belt mailed to you.  If you have any problems getting the belt, let your [facility] office know.

## 2022-08-13 ENCOUNTER — Other Ambulatory Visit: Payer: Self-pay

## 2022-08-13 ENCOUNTER — Encounter (HOSPITAL_COMMUNITY): Payer: Self-pay | Admitting: Certified Registered Nurse Anesthetist

## 2022-08-13 ENCOUNTER — Inpatient Hospital Stay (HOSPITAL_COMMUNITY)
Admission: AD | Admit: 2022-08-13 | Discharge: 2022-08-15 | DRG: 831 | Disposition: A | Discharge status: Home / Self Care | Payer: Medicaid Other | Attending: Obstetrics and Gynecology | Admitting: Obstetrics and Gynecology

## 2022-08-13 ENCOUNTER — Encounter (HOSPITAL_COMMUNITY): Payer: Self-pay | Admitting: Family Medicine

## 2022-08-13 DIAGNOSIS — Z3A26 26 weeks gestation of pregnancy: Secondary | ICD-10-CM

## 2022-08-13 DIAGNOSIS — R1111 Vomiting without nausea: Secondary | ICD-10-CM | POA: Diagnosis not present

## 2022-08-13 DIAGNOSIS — O98312 Other infections with a predominantly sexual mode of transmission complicating pregnancy, second trimester: Secondary | ICD-10-CM | POA: Diagnosis present

## 2022-08-13 DIAGNOSIS — E108 Type 1 diabetes mellitus with unspecified complications: Secondary | ICD-10-CM | POA: Diagnosis present

## 2022-08-13 DIAGNOSIS — Z87891 Personal history of nicotine dependence: Secondary | ICD-10-CM

## 2022-08-13 DIAGNOSIS — A6000 Herpesviral infection of urogenital system, unspecified: Secondary | ICD-10-CM | POA: Diagnosis present

## 2022-08-13 DIAGNOSIS — O99322 Drug use complicating pregnancy, second trimester: Secondary | ICD-10-CM | POA: Diagnosis present

## 2022-08-13 DIAGNOSIS — Z794 Long term (current) use of insulin: Secondary | ICD-10-CM

## 2022-08-13 DIAGNOSIS — F419 Anxiety disorder, unspecified: Principal | ICD-10-CM

## 2022-08-13 DIAGNOSIS — E8729 Other acidosis: Secondary | ICD-10-CM

## 2022-08-13 DIAGNOSIS — F1290 Cannabis use, unspecified, uncomplicated: Secondary | ICD-10-CM | POA: Diagnosis present

## 2022-08-13 DIAGNOSIS — O99342 Other mental disorders complicating pregnancy, second trimester: Secondary | ICD-10-CM | POA: Diagnosis present

## 2022-08-13 DIAGNOSIS — O0990 Supervision of high risk pregnancy, unspecified, unspecified trimester: Secondary | ICD-10-CM

## 2022-08-13 DIAGNOSIS — O365920 Maternal care for other known or suspected poor fetal growth, second trimester, not applicable or unspecified: Secondary | ICD-10-CM | POA: Diagnosis present

## 2022-08-13 DIAGNOSIS — O365990 Maternal care for other known or suspected poor fetal growth, unspecified trimester, not applicable or unspecified: Secondary | ICD-10-CM | POA: Diagnosis present

## 2022-08-13 DIAGNOSIS — E1110 Type 2 diabetes mellitus with ketoacidosis without coma: Secondary | ICD-10-CM | POA: Diagnosis present

## 2022-08-13 DIAGNOSIS — R112 Nausea with vomiting, unspecified: Secondary | ICD-10-CM | POA: Diagnosis not present

## 2022-08-13 DIAGNOSIS — Z9641 Presence of insulin pump (external) (internal): Secondary | ICD-10-CM | POA: Diagnosis present

## 2022-08-13 DIAGNOSIS — O24012 Pre-existing diabetes mellitus, type 1, in pregnancy, second trimester: Principal | ICD-10-CM | POA: Diagnosis present

## 2022-08-13 DIAGNOSIS — K5900 Constipation, unspecified: Secondary | ICD-10-CM | POA: Diagnosis not present

## 2022-08-13 DIAGNOSIS — E1010 Type 1 diabetes mellitus with ketoacidosis without coma: Secondary | ICD-10-CM | POA: Diagnosis present

## 2022-08-13 DIAGNOSIS — O212 Late vomiting of pregnancy: Secondary | ICD-10-CM | POA: Diagnosis present

## 2022-08-13 LAB — CBC
HCT: 35 % — ABNORMAL LOW (ref 36.0–46.0)
Hemoglobin: 12.3 g/dL (ref 12.0–15.0)
MCH: 29.5 pg (ref 26.0–34.0)
MCHC: 35.1 g/dL (ref 30.0–36.0)
MCV: 83.9 fL (ref 80.0–100.0)
Platelets: 351 10*3/uL (ref 150–400)
RBC: 4.17 MIL/uL (ref 3.87–5.11)
RDW: 13.2 % (ref 11.5–15.5)
WBC: 14.2 10*3/uL — ABNORMAL HIGH (ref 4.0–10.5)
nRBC: 0 % (ref 0.0–0.2)

## 2022-08-13 LAB — COMPREHENSIVE METABOLIC PANEL
ALT: 17 U/L (ref 0–44)
AST: 34 U/L (ref 15–41)
Albumin: 3.5 g/dL (ref 3.5–5.0)
Alkaline Phosphatase: 70 U/L (ref 38–126)
Anion gap: 17 — ABNORMAL HIGH (ref 5–15)
BUN: 13 mg/dL (ref 6–20)
CO2: 23 mmol/L (ref 22–32)
Calcium: 9.4 mg/dL (ref 8.9–10.3)
Chloride: 93 mmol/L — ABNORMAL LOW (ref 98–111)
Creatinine, Ser: 0.8 mg/dL (ref 0.44–1.00)
GFR, Estimated: >60 mL/min (ref >60)
Glucose, Bld: 219 mg/dL — ABNORMAL HIGH (ref 70–99)
Potassium: 4 mmol/L (ref 3.5–5.1)
Sodium: 133 mmol/L — ABNORMAL LOW (ref 135–145)
Total Bilirubin: 0.6 mg/dL (ref 0.3–1.2)
Total Protein: 7.8 g/dL (ref 6.5–8.1)

## 2022-08-13 LAB — URINALYSIS, ROUTINE W REFLEX MICROSCOPIC
Bilirubin Urine: NEGATIVE
Glucose, UA: >=500 mg/dL — AB
Hgb urine dipstick: NEGATIVE
Ketones, ur: 80 mg/dL — AB
Leukocytes,Ua: NEGATIVE
Nitrite: NEGATIVE
Protein, ur: 100 mg/dL — AB
Specific Gravity, Urine: 1.02 (ref 1.005–1.030)
pH: 6 (ref 5.0–8.0)

## 2022-08-13 LAB — GLUCOSE, CAPILLARY
Glucose-Capillary: 216 mg/dL — ABNORMAL HIGH (ref 70–99)
Glucose-Capillary: 173 mg/dL — ABNORMAL HIGH (ref 70–99)
Glucose-Capillary: 99 mg/dL (ref 70–99)
Glucose-Capillary: 130 mg/dL — ABNORMAL HIGH (ref 70–99)
Glucose-Capillary: 67 mg/dL — ABNORMAL LOW (ref 70–99)
Glucose-Capillary: 97 mg/dL (ref 70–99)
Glucose-Capillary: 155 mg/dL — ABNORMAL HIGH (ref 70–99)
Glucose-Capillary: 270 mg/dL — ABNORMAL HIGH (ref 70–99)
Glucose-Capillary: 358 mg/dL — ABNORMAL HIGH (ref 70–99)

## 2022-08-13 LAB — BETA-HYDROXYBUTYRIC ACID
Beta-Hydroxybutyric Acid: 3.56 mmol/L — ABNORMAL HIGH (ref 0.05–0.27)
Beta-Hydroxybutyric Acid: 0.49 mmol/L — ABNORMAL HIGH (ref 0.05–0.27)

## 2022-08-13 LAB — BASIC METABOLIC PANEL
Anion gap: 9 (ref 5–15)
BUN: 11 mg/dL (ref 6–20)
CO2: 23 mmol/L (ref 22–32)
Calcium: 8.5 mg/dL — ABNORMAL LOW (ref 8.9–10.3)
Chloride: 102 mmol/L (ref 98–111)
Creatinine, Ser: 0.61 mg/dL (ref 0.44–1.00)
GFR, Estimated: >60 mL/min (ref >60)
Glucose, Bld: 92 mg/dL (ref 70–99)
Potassium: 3.8 mmol/L (ref 3.5–5.1)
Sodium: 134 mmol/L — ABNORMAL LOW (ref 135–145)

## 2022-08-13 MED ORDER — LACTATED RINGERS IV SOLN: 125.0000 mL/h | INTRAVENOUS | Status: AC

## 2022-08-13 MED ORDER — DEXTROSE IN LACTATED RINGERS 5 % IV SOLN: INTRAVENOUS | Status: AC

## 2022-08-13 MED ORDER — DEXTROSE 50 % IV SOLN
0.0000 mL | INTRAVENOUS | Status: DC | PRN
  Administered 2022-08-13: 20 mL via INTRAVENOUS
  Filled 2022-08-13: qty 50

## 2022-08-13 MED ORDER — ONDANSETRON 4 MG PO TBDP: 4.0000 mg | ORAL_TABLET | Freq: Three times a day (TID) | ORAL | Status: DC | PRN

## 2022-08-13 MED ORDER — ACETAMINOPHEN 325 MG PO TABS: 650.0000 mg | ORAL_TABLET | ORAL | Status: DC | PRN

## 2022-08-13 MED ORDER — INSULIN ASPART 100 UNIT/ML IJ SOLN
5.0000 [IU] | Freq: Once | INTRAMUSCULAR | Status: AC
  Administered 2022-08-13: 5 [IU] via SUBCUTANEOUS

## 2022-08-13 MED ORDER — THIAMINE MONONITRATE 100 MG PO TABS
100.0000 mg | ORAL_TABLET | Freq: Every day | ORAL | Status: DC
  Administered 2022-08-14 – 2022-08-15 (×2): 100 mg via ORAL
  Filled 2022-08-13 (×3): qty 1

## 2022-08-13 MED ORDER — METOCLOPRAMIDE HCL 5 MG/ML IJ SOLN
10.0000 mg | Freq: Four times a day (QID) | INTRAMUSCULAR | Status: DC
  Administered 2022-08-13 – 2022-08-14 (×5): 10 mg via INTRAVENOUS
  Filled 2022-08-13 (×5): qty 2

## 2022-08-13 MED ORDER — ZOLPIDEM TARTRATE 5 MG PO TABS: 5.0000 mg | ORAL_TABLET | Freq: Every evening | ORAL | Status: DC | PRN

## 2022-08-13 MED ORDER — ENOXAPARIN SODIUM 40 MG/0.4ML IJ SOSY
40.0000 mg | PREFILLED_SYRINGE | INTRAMUSCULAR | Status: DC
  Administered 2022-08-13 – 2022-08-14 (×2): 40 mg via SUBCUTANEOUS
  Filled 2022-08-13 (×2): qty 0.4

## 2022-08-13 MED ORDER — LACTATED RINGERS IV BOLUS
20.0000 mL/kg | Freq: Once | INTRAVENOUS | Status: AC
  Administered 2022-08-13: 1000 mL via INTRAVENOUS

## 2022-08-13 MED ORDER — PANTOPRAZOLE SODIUM 40 MG IV SOLR
40.0000 mg | Freq: Once | INTRAVENOUS | Status: AC
  Administered 2022-08-13: 40 mg via INTRAVENOUS
  Filled 2022-08-13: qty 10

## 2022-08-13 MED ORDER — LACTATED RINGERS IV BOLUS
1000.0000 mL | Freq: Once | INTRAVENOUS | Status: AC
  Administered 2022-08-13: 1000 mL via INTRAVENOUS

## 2022-08-13 MED ORDER — PROMETHAZINE HCL 25 MG RE SUPP
12.5000 mg | RECTAL | Status: DC | PRN
  Administered 2022-08-13: 25 mg via RECTAL
  Filled 2022-08-13 (×2): qty 1
  Filled 2022-08-13: qty 2
  Filled 2022-08-13 (×2): qty 1

## 2022-08-13 MED ORDER — ONDANSETRON HCL 4 MG/2ML IJ SOLN: 4.0000 mg | Freq: Three times a day (TID) | INTRAMUSCULAR | Status: DC | PRN

## 2022-08-13 MED ORDER — PRENATAL MULTIVITAMIN CH
1.0000 | ORAL_TABLET | Freq: Every day | ORAL | Status: DC
  Administered 2022-08-14 – 2022-08-15 (×2): 1 via ORAL
  Filled 2022-08-13 (×2): qty 1

## 2022-08-13 MED ORDER — THIAMINE HCL 100 MG/ML IJ SOLN
100.0000 mg | Freq: Every day | INTRAMUSCULAR | Status: DC
  Administered 2022-08-13: 100 mg via INTRAVENOUS
  Filled 2022-08-13 (×3): qty 1

## 2022-08-13 MED ORDER — METOCLOPRAMIDE HCL 10 MG PO TABS
10.0000 mg | ORAL_TABLET | Freq: Four times a day (QID) | ORAL | Status: DC
  Administered 2022-08-14 – 2022-08-15 (×3): 10 mg via ORAL
  Filled 2022-08-13 (×4): qty 1

## 2022-08-13 MED ORDER — HYDROXYZINE HCL 50 MG/ML IM SOLN: 50.0000 mg | Freq: Four times a day (QID) | INTRAMUSCULAR | Status: DC | PRN

## 2022-08-13 MED ORDER — FAMOTIDINE IN NACL 20-0.9 MG/50ML-% IV SOLN
20.0000 mg | Freq: Once | INTRAVENOUS | Status: AC
  Administered 2022-08-13: 20 mg via INTRAVENOUS
  Filled 2022-08-13: qty 50

## 2022-08-13 MED ORDER — CALCIUM CARBONATE ANTACID 500 MG PO CHEW: 2.0000 | CHEWABLE_TABLET | ORAL | Status: DC | PRN

## 2022-08-13 MED ORDER — DEXTROSE 5 % IN LACTATED RINGERS IV BOLUS
500.0000 mL | Freq: Once | INTRAVENOUS | Status: AC
  Administered 2022-08-13: 500 mL via INTRAVENOUS

## 2022-08-13 MED ORDER — INSULIN REGULAR(HUMAN) IN NACL 100-0.9 UT/100ML-% IV SOLN
INTRAVENOUS | Status: AC
  Administered 2022-08-13: 0.6 [IU]/h via INTRAVENOUS
  Administered 2022-08-13: 0.3 [IU]/h via INTRAVENOUS
  Filled 2022-08-13: qty 100

## 2022-08-13 MED ORDER — SODIUM CHLORIDE 0.9 % IV SOLN
25.0000 mg | Freq: Once | INTRAVENOUS | Status: AC
  Administered 2022-08-13: 25 mg via INTRAVENOUS
  Filled 2022-08-13: qty 1

## 2022-08-13 MED ORDER — SODIUM CHLORIDE 0.9 % IV SOLN: 8.0000 mg | Freq: Three times a day (TID) | INTRAVENOUS | Status: DC | PRN

## 2022-08-13 MED ORDER — DOCUSATE SODIUM 100 MG PO CAPS
100.0000 mg | ORAL_CAPSULE | Freq: Every day | ORAL | Status: DC
  Administered 2022-08-14 – 2022-08-15 (×2): 100 mg via ORAL
  Filled 2022-08-13 (×2): qty 1

## 2022-08-13 MED ORDER — SERTRALINE HCL 50 MG PO TABS
50.0000 mg | ORAL_TABLET | Freq: Every day | ORAL | Status: DC
  Administered 2022-08-14 – 2022-08-15 (×2): 50 mg via ORAL
  Filled 2022-08-13 (×2): qty 1

## 2022-08-13 MED ORDER — LACTATED RINGERS IV SOLN: INTRAVENOUS | Status: DC

## 2022-08-13 MED ORDER — POTASSIUM CHLORIDE 10 MEQ/100ML IV SOLN
10.0000 meq | Freq: Once | INTRAVENOUS | Status: AC
  Administered 2022-08-13: 10 meq via INTRAVENOUS

## 2022-08-13 MED ORDER — PROMETHAZINE HCL 25 MG RE SUPP
25.0000 mg | Freq: Once | RECTAL | Status: AC
  Administered 2022-08-13: 25 mg via RECTAL
  Filled 2022-08-13: qty 1

## 2022-08-13 MED ORDER — FOLIC ACID 1 MG PO TABS
1.0000 mg | ORAL_TABLET | Freq: Every day | ORAL | Status: DC
  Administered 2022-08-14 – 2022-08-15 (×2): 1 mg via ORAL
  Filled 2022-08-13 (×2): qty 1

## 2022-08-13 MED ORDER — POTASSIUM CHLORIDE 10 MEQ/100ML IV SOLN
10.0000 meq | INTRAVENOUS | Status: AC
  Administered 2022-08-13: 10 meq via INTRAVENOUS
  Filled 2022-08-13 (×2): qty 100

## 2022-08-13 MED ORDER — SCOPOLAMINE 1 MG/3DAYS TD PT72
1.0000 | MEDICATED_PATCH | TRANSDERMAL | Status: DC
  Administered 2022-08-13: 1.5 mg via TRANSDERMAL
  Filled 2022-08-13: qty 1

## 2022-08-13 MED ORDER — HYDROXYZINE HCL 50 MG PO TABS: 50.0000 mg | ORAL_TABLET | Freq: Four times a day (QID) | ORAL | Status: DC | PRN

## 2022-08-13 MED ORDER — FOLIC ACID 5 MG/ML IJ SOLN
1.0000 mg | Freq: Every day | INTRAMUSCULAR | Status: DC
  Administered 2022-08-13: 1 mg via INTRAVENOUS
  Filled 2022-08-13 (×3): qty 0.2

## 2022-08-13 MED ORDER — PROMETHAZINE HCL 25 MG PO TABS: 12.5000 mg | ORAL_TABLET | ORAL | Status: DC | PRN
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
## 2022-08-13 NOTE — Progress Notes (Signed)
A consult was placed to IV Team for iv access;  pt upset, crying, talking w her Dad by phone;  pt having dry heaves, vomiting;  attempted x 2 with ultrasound but unable to thread catheters.  No further attempts made.  RN aware.

## 2022-08-13 NOTE — MAU Note (Addendum)
.  Sabrina Sutton is a 27 y.o. at 25w3dhere in MAU reporting: via EMS with severe vomiting.  Pt states that she was here yesterday for the same thing.  Pt reports that she took oral Phenergan however it did not stay down.  Pt states that she also gave herself insulin 2.95 units around 6 or 7 am.  Denies VB and LOF.  +FM  Onset of complaint: yesterday 08/12/22 Pain score: 0 Vitals:   08/13/22 0916  BP: 105/69  Pulse: (!) 117  Resp: 19  Temp: 98.1 F (36.7 C)  SpO2: 100%     FHT:150  Lab orders placed from triage:    none

## 2022-08-14 ENCOUNTER — Ambulatory Visit: Payer: Medicaid Other

## 2022-08-14 ENCOUNTER — Observation Stay (HOSPITAL_BASED_OUTPATIENT_CLINIC_OR_DEPARTMENT_OTHER): Payer: Medicaid Other

## 2022-08-14 DIAGNOSIS — Z3A26 26 weeks gestation of pregnancy: Secondary | ICD-10-CM | POA: Diagnosis not present

## 2022-08-14 DIAGNOSIS — Z9641 Presence of insulin pump (external) (internal): Secondary | ICD-10-CM | POA: Diagnosis present

## 2022-08-14 DIAGNOSIS — E119 Type 2 diabetes mellitus without complications: Secondary | ICD-10-CM | POA: Diagnosis not present

## 2022-08-14 DIAGNOSIS — E1110 Type 2 diabetes mellitus with ketoacidosis without coma: Secondary | ICD-10-CM | POA: Diagnosis present

## 2022-08-14 DIAGNOSIS — O210 Mild hyperemesis gravidarum: Secondary | ICD-10-CM | POA: Diagnosis not present

## 2022-08-14 DIAGNOSIS — E1010 Type 1 diabetes mellitus with ketoacidosis without coma: Secondary | ICD-10-CM | POA: Diagnosis not present

## 2022-08-14 DIAGNOSIS — O24112 Pre-existing diabetes mellitus, type 2, in pregnancy, second trimester: Secondary | ICD-10-CM

## 2022-08-14 DIAGNOSIS — O365920 Maternal care for other known or suspected poor fetal growth, second trimester, not applicable or unspecified: Secondary | ICD-10-CM

## 2022-08-14 DIAGNOSIS — O99322 Drug use complicating pregnancy, second trimester: Secondary | ICD-10-CM | POA: Diagnosis present

## 2022-08-14 DIAGNOSIS — Z794 Long term (current) use of insulin: Secondary | ICD-10-CM

## 2022-08-14 DIAGNOSIS — O365921 Maternal care for other known or suspected poor fetal growth, second trimester, fetus 1: Secondary | ICD-10-CM

## 2022-08-14 DIAGNOSIS — O99342 Other mental disorders complicating pregnancy, second trimester: Secondary | ICD-10-CM | POA: Diagnosis present

## 2022-08-14 DIAGNOSIS — F1290 Cannabis use, unspecified, uncomplicated: Secondary | ICD-10-CM | POA: Diagnosis present

## 2022-08-14 DIAGNOSIS — O212 Late vomiting of pregnancy: Secondary | ICD-10-CM | POA: Diagnosis present

## 2022-08-14 DIAGNOSIS — A6000 Herpesviral infection of urogenital system, unspecified: Secondary | ICD-10-CM | POA: Diagnosis present

## 2022-08-14 DIAGNOSIS — O24012 Pre-existing diabetes mellitus, type 1, in pregnancy, second trimester: Secondary | ICD-10-CM | POA: Diagnosis present

## 2022-08-14 DIAGNOSIS — O98312 Other infections with a predominantly sexual mode of transmission complicating pregnancy, second trimester: Secondary | ICD-10-CM | POA: Diagnosis present

## 2022-08-14 DIAGNOSIS — Z87891 Personal history of nicotine dependence: Secondary | ICD-10-CM | POA: Diagnosis not present

## 2022-08-14 DIAGNOSIS — F419 Anxiety disorder, unspecified: Secondary | ICD-10-CM | POA: Diagnosis present

## 2022-08-14 LAB — GLUCOSE, CAPILLARY
Glucose-Capillary: 115 mg/dL — ABNORMAL HIGH (ref 70–99)
Glucose-Capillary: 127 mg/dL — ABNORMAL HIGH (ref 70–99)
Glucose-Capillary: 127 mg/dL — ABNORMAL HIGH (ref 70–99)
Glucose-Capillary: 129 mg/dL — ABNORMAL HIGH (ref 70–99)
Glucose-Capillary: 132 mg/dL — ABNORMAL HIGH (ref 70–99)
Glucose-Capillary: 133 mg/dL — ABNORMAL HIGH (ref 70–99)
Glucose-Capillary: 134 mg/dL — ABNORMAL HIGH (ref 70–99)
Glucose-Capillary: 134 mg/dL — ABNORMAL HIGH (ref 70–99)
Glucose-Capillary: 141 mg/dL — ABNORMAL HIGH (ref 70–99)
Glucose-Capillary: 147 mg/dL — ABNORMAL HIGH (ref 70–99)
Glucose-Capillary: 165 mg/dL — ABNORMAL HIGH (ref 70–99)
Glucose-Capillary: 168 mg/dL — ABNORMAL HIGH (ref 70–99)
Glucose-Capillary: 188 mg/dL — ABNORMAL HIGH (ref 70–99)
Glucose-Capillary: 217 mg/dL — ABNORMAL HIGH (ref 70–99)
Glucose-Capillary: 226 mg/dL — ABNORMAL HIGH (ref 70–99)
Glucose-Capillary: 76 mg/dL (ref 70–99)
Glucose-Capillary: 79 mg/dL (ref 70–99)

## 2022-08-14 LAB — HEMOGLOBIN A1C
Hgb A1c MFr Bld: 6.4 % — ABNORMAL HIGH (ref 4.8–5.6)
Mean Plasma Glucose: 136.98 mg/dL

## 2022-08-14 LAB — BASIC METABOLIC PANEL
Anion gap: 10 (ref 5–15)
Anion gap: 6 (ref 5–15)
Anion gap: 8 (ref 5–15)
BUN: 6 mg/dL (ref 6–20)
BUN: 7 mg/dL (ref 6–20)
BUN: 8 mg/dL (ref 6–20)
CO2: 22 mmol/L (ref 22–32)
CO2: 22 mmol/L (ref 22–32)
CO2: 22 mmol/L (ref 22–32)
Calcium: 8.1 mg/dL — ABNORMAL LOW (ref 8.9–10.3)
Calcium: 8.2 mg/dL — ABNORMAL LOW (ref 8.9–10.3)
Calcium: 8.3 mg/dL — ABNORMAL LOW (ref 8.9–10.3)
Chloride: 102 mmol/L (ref 98–111)
Chloride: 102 mmol/L (ref 98–111)
Chloride: 102 mmol/L (ref 98–111)
Creatinine, Ser: 0.51 mg/dL (ref 0.44–1.00)
Creatinine, Ser: 0.61 mg/dL (ref 0.44–1.00)
Creatinine, Ser: 0.71 mg/dL (ref 0.44–1.00)
GFR, Estimated: >60 mL/min (ref >60)
GFR, Estimated: >60 mL/min (ref >60)
GFR, Estimated: >60 mL/min (ref >60)
Glucose, Bld: 126 mg/dL — ABNORMAL HIGH (ref 70–99)
Glucose, Bld: 134 mg/dL — ABNORMAL HIGH (ref 70–99)
Glucose, Bld: 203 mg/dL — ABNORMAL HIGH (ref 70–99)
Potassium: 3 mmol/L — ABNORMAL LOW (ref 3.5–5.1)
Potassium: 3.1 mmol/L — ABNORMAL LOW (ref 3.5–5.1)
Potassium: 3.2 mmol/L — ABNORMAL LOW (ref 3.5–5.1)
Sodium: 130 mmol/L — ABNORMAL LOW (ref 135–145)
Sodium: 132 mmol/L — ABNORMAL LOW (ref 135–145)
Sodium: 134 mmol/L — ABNORMAL LOW (ref 135–145)

## 2022-08-14 LAB — BETA-HYDROXYBUTYRIC ACID
Beta-Hydroxybutyric Acid: 0.23 mmol/L (ref 0.05–0.27)
Beta-Hydroxybutyric Acid: 0.31 mmol/L — ABNORMAL HIGH (ref 0.05–0.27)

## 2022-08-14 MED ORDER — POTASSIUM CHLORIDE 10 MEQ/100ML IV SOLN
10.0000 meq | INTRAVENOUS | Status: AC
  Administered 2022-08-14 (×5): 10 meq via INTRAVENOUS
  Filled 2022-08-14 (×5): qty 100

## 2022-08-14 MED ORDER — INSULIN PUMP
SUBCUTANEOUS | Status: DC
  Administered 2022-08-15: 4.7 via SUBCUTANEOUS
  Filled 2022-08-14: qty 1

## 2022-08-14 MED ORDER — PHENOL 1.4 % MT LIQD
1.0000 | OROMUCOSAL | Status: DC | PRN
  Administered 2022-08-14: 1 via OROMUCOSAL
  Filled 2022-08-14: qty 177

## 2022-08-14 NOTE — Inpatient Diabetes Management (Signed)
ADA Standards of Care 2024 Diabetes in Pregnancy Target Glucose Ranges:  Fasting: 70 - 95 mg/dL 1 hr postprandial:  110 - '140mg'$ /dL (from first bite of meal) 2 hr postprandial:  100 - 120 mg/dL (from first bit of meal)   Lab Results  Component Value Date   GLUCAP 147 (H) 08/14/2022   HGBA1C 6.4 (H) 08/14/2022    Review of Glycemic Control  Diabetes history: DM 1 Outpatient Diabetes medications:  Omnipod-  Basal settings Max basal 3 Basal rates 0.7 Temp basal   Bolus settings Target Blood glucose  110 Insulin to carb (IC) ratio  13 Correction factor 50 mg/dL/unit for BG above 110 mg/dL Minimum blood glucose for bolus calculations 70 mg/dL Current orders for Inpatient glycemic control:  IV insulin  Inpatient Diabetes Program Recommendations:    Note labs improved.  Spoke with patient and she has all pump supplies at bedside and is ready to restart insulin pump.  Discussed with Dr. Damita Dunnings and orders received to transition patient from IV insulin back to insulin pump.   Watched patient reapply new Omnipod to left thigh.  She also has Dexcom on Right arm.  Per orders, RN will continue IV insulin 2 hours after application of Omnipod.  Patient states that blood sugars have been improved since she saw diabetes educator and that she has not had as many lows.   Discussed plan with patient and RN.  Orders also received to stop D5LR when IV insulin stopped.   Thanks,  Adah Perl, RN, BC-ADM Inpatient Diabetes Coordinator Pager [phone]  (8a-5p)

## 2022-08-14 NOTE — Inpatient Diabetes Management (Signed)
Briefly spoke with patient at bedside.  Insulin drip is off and insulin pump is infusing.  She states that her CBG is currently 115 mg/dL.  Will follow.   Thanks,  Adah Perl, RN, BC-ADM Inpatient Diabetes Coordinator Pager [phone]  (8a-5p)

## 2022-08-14 NOTE — Progress Notes (Signed)
[facility] COMPREHENSIVE PROGRESS NOTE  Sabrina Sutton is a 27 y.o. G1P0 at 70w4dwho is admitted for DKA.  Estimated Date of Delivery: 11/16/22 Fetal presentation is unsure.  Length of Stay:  0 Days. Admitted 08/13/2022  Subjective: Feeling improved this morning. Had some emesis overnight but none recently and has tolerated water. Patient reports good fetal movement.  She reports no uterine contractions, no bleeding and no loss of fluid per vagina.  Vitals:  Blood pressure 109/67, pulse (!) 105, temperature 98 F (36.7 C), temperature source Oral, resp. rate 14, height '5\' 7"'$  (1.702 m), weight 56.2 kg, last menstrual period 02/09/2022, SpO2 99 %. Physical Examination: CONSTITUTIONAL: Well-developed, well-nourished female in no acute distress.  NEUROLOGIC: Alert and oriented to person, place, and time. No cranial nerve deficit noted. PSYCHIATRIC: Normal mood and affect. Normal behavior. Normal judgment and thought content. CARDIOVASCULAR: Normal heart rate noted, regular rhythm RESPIRATORY: Effort and breath sounds normal, no problems with respiration noted MUSCULOSKELETAL: Normal range of motion. No edema and no tenderness. 2+ distal pulses. ABDOMEN: Soft, nontender, nondistended, gravid. CERVIX:  Not examine  Fetal monitoring: FHR: 140 bpm, Variability: moderate, Accelerations: Present, Decelerations: Absent except occasional variables that are GA appropriate Uterine activity: Irritable  Results for orders placed or performed during the hospital encounter of 08/13/22 (from the past 48 hour(s))  Urinalysis, Routine w reflex microscopic Urine, Clean Catch     Status: Abnormal   Collection Time: 08/13/22 11:40 AM  Result Value Ref Range   Color, Urine YELLOW YELLOW   APPearance CLOUDY (A) CLEAR   Specific Gravity, Urine 1.020 1.005 - 1.030   pH 6.0 5.0 - 8.0   Glucose, UA >=500 (A) NEGATIVE mg/dL   Hgb urine dipstick NEGATIVE NEGATIVE   Bilirubin Urine NEGATIVE  NEGATIVE   Ketones, ur 80 (A) NEGATIVE mg/dL   Protein, ur 100 (A) NEGATIVE mg/dL   Nitrite NEGATIVE NEGATIVE   Leukocytes,Ua NEGATIVE NEGATIVE   RBC / HPF 0-5 0 - 5 RBC/hpf   WBC, UA 0-5 0 - 5 WBC/hpf   Bacteria, UA RARE (A) NONE SEEN   Squamous Epithelial / HPF 11-20 0 - 5 /HPF   Mucus PRESENT     Comment: [facility]formed at [facility] [address] [place] [address] CBC     Status: Abnormal   Collection Time: 08/13/22 12:17 PM  Result Value Ref Range   WBC 14.2 (H) 4.0 - 10.5 K/uL   RBC 4.17 3.87 - 5.11 MIL/uL   Hemoglobin 12.3 12.0 - 15.0 g/dL   HCT 35.0 (L) 36.0 - 46.0 %   MCV 83.9 80.0 - 100.0 fL   MCH 29.5 26.0 - 34.0 pg   MCHC 35.1 30.0 - 36.0 g/dL   RDW 13.2 11.5 - 15.5 %   Platelets 351 150 - 400 K/uL   nRBC 0.0 0.0 - 0.2 %    Comment: Performed at [facility] [address], [place] [address] Comprehensive metabolic panel     Status: Abnormal   Collection Time: 08/13/22 12:19 PM  Result Value Ref Range   Sodium 133 (L) 135 - 145 mmol/L   Potassium 4.0 3.5 - 5.1 mmol/L   Chloride 93 (L) 98 - 111 mmol/L   CO2 23 22 - 32 mmol/L   Glucose, Bld 219 (H) 70 - 99 mg/dL    Comment: Glucose reference range applies only to samples taken after fasting for at least 8 hours.   BUN 13  6 - 20 mg/dL   Creatinine, Ser 0.80 0.44 - 1.00 mg/dL   Calcium 9.4 8.9 - 10.3 mg/dL   Total Protein 7.8 6.5 - 8.1 g/dL   Albumin 3.5 3.5 - 5.0 g/dL   AST 34 15 - 41 U/L   ALT 17 0 - 44 U/L   Alkaline Phosphatase 70 38 - 126 U/L   Total Bilirubin 0.6 0.3 - 1.2 mg/dL   GFR, Estimated >60 >60 mL/min    Comment: (NOTE) Calculated using the CKD-EPI Creatinine Equation (2021)    Anion gap 17 (H) 5 - 15    Comment: Performed at [facility] [address], [place], [place] [address]  Glucose, capillary     Status: Abnormal   Collection Time: 08/13/22 12:19 PM  Result Value Ref Range   Glucose-Capillary 216 (H) 70 - 99 mg/dL    Comment: Glucose reference  range applies only to samples taken after fasting for at least 8 hours.  Beta-hydroxybutyric acid     Status: Abnormal   Collection Time: 08/13/22  1:58 PM  Result Value Ref Range   Beta-Hydroxybutyric Acid 3.56 (H) 0.05 - 0.27 mmol/L    Comment: Performed at [facility] [address], [place], [place] [address]  Glucose, capillary     Status: Abnormal   Collection Time: 08/13/22  3:28 PM  Result Value Ref Range   Glucose-Capillary 173 (H) 70 - 99 mg/dL    Comment: Glucose reference range applies only to samples taken after fasting for at least 8 hours.  Beta-hydroxybutyric acid     Status: Abnormal   Collection Time: 08/13/22  4:33 PM  Result Value Ref Range   Beta-Hydroxybutyric Acid 0.49 (H) 0.05 - 0.27 mmol/L    Comment: Performed at [facility] [address], [place], [place] [address]  Glucose, capillary     Status: None   Collection Time: 08/13/22  6:05 PM  Result Value Ref Range   Glucose-Capillary 99 70 - 99 mg/dL    Comment: Glucose reference range applies only to samples taken after fasting for at least 8 hours.  Glucose, capillary     Status: Abnormal   Collection Time: 08/13/22  6:40 PM  Result Value Ref Range   Glucose-Capillary 67 (L) 70 - 99 mg/dL    Comment: Glucose reference range applies only to samples taken after fasting for at least 8 hours.  Glucose, capillary     Status: Abnormal   Collection Time: 08/13/22  7:11 PM  Result Value Ref Range   Glucose-Capillary 130 (H) 70 - 99 mg/dL    Comment: Glucose reference range applies only to samples taken after fasting for at least 8 hours.  Basic metabolic panel     Status: Abnormal   Collection Time: 08/13/22  8:20 PM  Result Value Ref Range   Sodium 134 (L) 135 - 145 mmol/L   Potassium 3.8 3.5 - 5.1 mmol/L   Chloride 102 98 - 111 mmol/L   CO2 23 22 - 32 mmol/L   Glucose, Bld 92 70 - 99 mg/dL    Comment: Glucose reference range applies only to samples taken after fasting for at least 8 hours.    BUN 11 6 - 20 mg/dL   Creatinine, Ser 0.61 0.44 - 1.00 mg/dL   Calcium 8.5 (L) 8.9 - 10.3 mg/dL   GFR, Estimated >60 >60 mL/min    Comment: (NOTE) Calculated using the CKD-EPI Creatinine Equation (2021)    Anion gap  9 5 - 15    Comment: Performed at [facility], [place] [address], [place], [place] [address]  Glucose, capillary     Status: None   Collection Time: 08/13/22  8:22 PM  Result Value Ref Range   Glucose-Capillary 97 70 - 99 mg/dL    Comment: Glucose reference range applies only to samples taken after fasting for at least 8 hours.  Glucose, capillary     Status: Abnormal   Collection Time: 08/13/22  9:25 PM  Result Value Ref Range   Glucose-Capillary 155 (H) 70 - 99 mg/dL    Comment: Glucose reference range applies only to samples taken after fasting for at least 8 hours.  Glucose, capillary     Status: Abnormal   Collection Time: 08/13/22 10:30 PM  Result Value Ref Range   Glucose-Capillary 358 (H) 70 - 99 mg/dL    Comment: Glucose reference range applies only to samples taken after fasting for at least 8 hours.  Glucose, capillary     Status: Abnormal   Collection Time: 08/13/22 11:02 PM  Result Value Ref Range   Glucose-Capillary 270 (H) 70 - 99 mg/dL    Comment: Glucose reference range applies only to samples taken after fasting for at least 8 hours.  Glucose, capillary     Status: Abnormal   Collection Time: 08/13/22 11:35 PM  Result Value Ref Range   Glucose-Capillary 226 (H) 70 - 99 mg/dL    Comment: Glucose reference range applies only to samples taken after fasting for at least 8 hours.  Glucose, capillary     Status: Abnormal   Collection Time: 08/14/22 12:08 AM  Result Value Ref Range   Glucose-Capillary 217 (H) 70 - 99 mg/dL    Comment: Glucose reference range applies only to samples taken after fasting for at least 8 hours.  Basic metabolic panel     Status: Abnormal   Collection Time: 08/14/22 12:11 AM  Result Value Ref Range   Sodium 130 (L)  135 - 145 mmol/L   Potassium 3.1 (L) 3.5 - 5.1 mmol/L   Chloride 102 98 - 111 mmol/L   CO2 22 22 - 32 mmol/L   Glucose, Bld 203 (H) 70 - 99 mg/dL    Comment: Glucose reference range applies only to samples taken after fasting for at least 8 hours.   BUN 8 6 - 20 mg/dL   Creatinine, Ser 0.71 0.44 - 1.00 mg/dL   Calcium 8.1 (L) 8.9 - 10.3 mg/dL   GFR, Estimated >60 >60 mL/min    Comment: (NOTE) Calculated using the CKD-EPI Creatinine Equation (2021)    Anion gap 6 5 - 15    Comment: Performed at [facility] [address], [place], [place] [address]  Beta-hydroxybutyric acid     Status: Abnormal   Collection Time: 08/14/22 12:11 AM  Result Value Ref Range   Beta-Hydroxybutyric Acid 0.31 (H) 0.05 - 0.27 mmol/L    Comment: Performed at [facility] [address], [place], [place] [address]  Glucose, capillary     Status: Abnormal   Collection Time: 08/14/22 12:44 AM  Result Value Ref Range   Glucose-Capillary 188 (H) 70 - 99 mg/dL    Comment: Glucose reference range applies only to samples taken after fasting for at least 8 hours.  Glucose, capillary     Status: Abnormal   Collection Time: 08/14/22  1:44 AM  Result Value Ref Range   Glucose-Capillary 168 (H) 70 - 99  mg/dL    Comment: Glucose reference range applies only to samples taken after fasting for at least 8 hours.   Comment 1 Notify RN   Glucose, capillary     Status: Abnormal   Collection Time: 08/14/22  2:44 AM  Result Value Ref Range   Glucose-Capillary 141 (H) 70 - 99 mg/dL    Comment: Glucose reference range applies only to samples taken after fasting for at least 8 hours.  Glucose, capillary     Status: Abnormal   Collection Time: 08/14/22  3:45 AM  Result Value Ref Range   Glucose-Capillary 132 (H) 70 - 99 mg/dL    Comment: Glucose reference range applies only to samples taken after fasting for at least 8 hours.  Glucose, capillary     Status: Abnormal   Collection Time: 08/14/22  4:48 AM  Result  Value Ref Range   Glucose-Capillary 134 (H) 70 - 99 mg/dL    Comment: Glucose reference range applies only to samples taken after fasting for at least 8 hours.   Comment 1 Notify RN   Basic metabolic panel     Status: Abnormal   Collection Time: 08/14/22  4:50 AM  Result Value Ref Range   Sodium 134 (L) 135 - 145 mmol/L   Potassium 3.2 (L) 3.5 - 5.1 mmol/L   Chloride 102 98 - 111 mmol/L   CO2 22 22 - 32 mmol/L   Glucose, Bld 126 (H) 70 - 99 mg/dL    Comment: Glucose reference range applies only to samples taken after fasting for at least 8 hours.   BUN 7 6 - 20 mg/dL   Creatinine, Ser 0.51 0.44 - 1.00 mg/dL   Calcium 8.3 (L) 8.9 - 10.3 mg/dL   GFR, Estimated >60 >60 mL/min    Comment: (NOTE) Calculated using the CKD-EPI Creatinine Equation (2021)    Anion gap 10 5 - 15    Comment: Performed at [facility] [address], [place], [place] [address]  Hemoglobin A1c     Status: Abnormal   Collection Time: 08/14/22  4:50 AM  Result Value Ref Range   Hgb A1c MFr Bld 6.4 (H) 4.8 - 5.6 %    Comment: (NOTE) Pre diabetes:          5.7%-6.4%  Diabetes:              >6.4%  Glycemic control for   <7.0% adults with diabetes    Mean Plasma Glucose 136.98 mg/dL    Comment: Performed at [facility] [address], [place], [place] [address]  Glucose, capillary     Status: Abnormal   Collection Time: 08/14/22  5:51 AM  Result Value Ref Range   Glucose-Capillary 127 (H) 70 - 99 mg/dL    Comment: Glucose reference range applies only to samples taken after fasting for at least 8 hours.   Comment 1 Notify RN   Glucose, capillary     Status: Abnormal   Collection Time: 08/14/22  6:53 AM  Result Value Ref Range   Glucose-Capillary 165 (H) 70 - 99 mg/dL    Comment: Glucose reference range applies only to samples taken after fasting for at least 8 hours.  Glucose, capillary     Status: Abnormal   Collection Time: 08/14/22  7:58 AM  Result Value Ref Range   Glucose-Capillary  147 (H) 70 - 99 mg/dL    Comment: Glucose reference range applies only to samples taken after fasting for at least  8 hours.    No results found.  Current scheduled medications  docusate sodium  100 mg Oral Daily   enoxaparin (LOVENOX) injection  40 mg Subcutaneous K93G   folic acid  1 mg Oral Daily   Or   folic acid  1 mg Intravenous Daily   metoCLOPramide  10 mg Oral Q6H   Or   metoCLOPramide (REGLAN) injection  10 mg Intravenous Q6H   prenatal multivitamin  1 tablet Oral Q1200   scopolamine  1 patch Transdermal Q72H   sertraline  50 mg Oral Daily   thiamine  100 mg Oral Daily   Or   thiamine  100 mg Intravenous Daily    I have reviewed the patient's current medications.  ASSESSMENT: Active Problems:   Type 1 diabetes mellitus with complications (HCC)   DKA, type 1 (HCC)   IUGR (intrauterine growth restriction) affecting care of mother   PLAN: DKA/T1DM - Prior to this was on Omnipod/Dexcom (switched on 1/9). Reviewed noted from [facility] DM educator's input once seen today. Continue Endotool until that time, but we will plan to transition to her regular system today.  - BHB is improving as are her other labs. Will continue to replete potassium. AG has closed.  - Will be able to do D/C tele later today as well as CEFM. Will continue until after [place].   - Advance to carb modified diet today since N/V improved  N/V of pregnancy - Continue anti-emetics: Zofran, reglan, phenergan and scop patch  FGR - Stable FGR from review of [place] reports. Last growth was 1/9 and was 6%ile, normal AFI, normal UADs. Due for UADs today and ordered. Will do CEFM until this is completed then per MFM plan, will do FHT qshift and hold on NST until 28w unless indicated for other reasons during admission.   Routine antenatal care - Routine PNC is up to date.     Radene Gunning, MD, [place] for [facility], [place]

## 2022-08-15 LAB — GLUCOSE, CAPILLARY
Glucose-Capillary: 114 mg/dL — ABNORMAL HIGH (ref 70–99)
Glucose-Capillary: 133 mg/dL — ABNORMAL HIGH (ref 70–99)
Glucose-Capillary: 136 mg/dL — ABNORMAL HIGH (ref 70–99)
Glucose-Capillary: 39 mg/dL — CL (ref 70–99)
Glucose-Capillary: 43 mg/dL — CL (ref 70–99)
Glucose-Capillary: 63 mg/dL — ABNORMAL LOW (ref 70–99)
Glucose-Capillary: 77 mg/dL (ref 70–99)
Glucose-Capillary: 78 mg/dL (ref 70–99)

## 2022-08-15 LAB — BASIC METABOLIC PANEL
Anion gap: 12 (ref 5–15)
BUN: <5 mg/dL — ABNORMAL LOW (ref 6–20)
CO2: 21 mmol/L — ABNORMAL LOW (ref 22–32)
Calcium: 9 mg/dL (ref 8.9–10.3)
Chloride: 102 mmol/L (ref 98–111)
Creatinine, Ser: 0.65 mg/dL (ref 0.44–1.00)
GFR, Estimated: >60 mL/min (ref >60)
Glucose, Bld: 103 mg/dL — ABNORMAL HIGH (ref 70–99)
Potassium: 3.5 mmol/L (ref 3.5–5.1)
Sodium: 135 mmol/L (ref 135–145)

## 2022-08-15 MED ORDER — SCOPOLAMINE 1 MG/3DAYS TD PT72: 1.0000 | MEDICATED_PATCH | TRANSDERMAL | 12 refills | Status: DC

## 2022-08-15 MED ORDER — PROMETHAZINE HCL 25 MG PO TABS: 25.0000 mg | ORAL_TABLET | Freq: Four times a day (QID) | ORAL | 3 refills | Status: DC | PRN

## 2022-08-15 MED ORDER — ONDANSETRON 4 MG PO TBDP: 4.0000 mg | ORAL_TABLET | Freq: Three times a day (TID) | ORAL | 3 refills | Status: DC | PRN

## 2022-08-15 MED ORDER — METOCLOPRAMIDE HCL 10 MG PO TABS: 10.0000 mg | ORAL_TABLET | Freq: Four times a day (QID) | ORAL | 3 refills | Status: DC

## 2022-08-15 NOTE — Progress Notes (Signed)
[facility] COMPREHENSIVE PROGRESS NOTE  Sabrina Sutton is a 27 y.o. G1P0 at 49w5dwho is admitted for DKA.  Estimated Date of Delivery: 11/16/22 Fetal presentation is unsure.  Length of Stay:  1 Days. Admitted 08/13/2022  Subjective: Feeling very well this morning. Eating regular diet this morning feels well. Had one hypoglycemic event overnight that responded well to interventions.  Patient reports good fetal movement.  She reports no uterine contractions, no bleeding and no loss of fluid per vagina.  Vitals:  Blood pressure 113/80, pulse (!) 107, temperature (!) 97.4 F (36.3 C), temperature source Oral, resp. rate 19, height '5\' 7"'$  (1.702 m), weight 53.2 kg, last menstrual period 02/09/2022, SpO2 100 %. Physical Examination: CONSTITUTIONAL: Well-developed, well-nourished female in no acute distress.  NEUROLOGIC: Alert and oriented to person, place, and time. No cranial nerve deficit noted. PSYCHIATRIC: Normal mood and affect. Normal behavior. Normal judgment and thought content. CARDIOVASCULAR: Normal heart rate noted, regular rhythm RESPIRATORY: Effort and breath sounds normal, no problems with respiration noted MUSCULOSKELETAL: Normal range of motion. No edema and no tenderness. 2+ distal pulses. ABDOMEN: Soft, nontender, nondistended, gravid. CERVIX:  Not examined   Results for orders placed or performed during the hospital encounter of 08/13/22 (from the past 48 hour(s))  Urinalysis, Routine w reflex microscopic Urine, Clean Catch     Status: Abnormal   Collection Time: 08/13/22 11:40 AM  Result Value Ref Range   Color, Urine YELLOW YELLOW   APPearance CLOUDY (A) CLEAR   Specific Gravity, Urine 1.020 1.005 - 1.030   pH 6.0 5.0 - 8.0   Glucose, UA >=500 (A) NEGATIVE mg/dL   Hgb urine dipstick NEGATIVE NEGATIVE   Bilirubin Urine NEGATIVE NEGATIVE   Ketones, ur 80 (A) NEGATIVE mg/dL   Protein, ur 100 (A) NEGATIVE mg/dL   Nitrite NEGATIVE NEGATIVE    Leukocytes,Ua NEGATIVE NEGATIVE   RBC / HPF 0-5 0 - 5 RBC/hpf   WBC, UA 0-5 0 - 5 WBC/hpf   Bacteria, UA RARE (A) NONE SEEN   Squamous Epithelial / HPF 11-20 0 - 5 /HPF   Mucus PRESENT     Comment: Performed at [facility] [address], [place] [id number] CBC     Status: Abnormal   Collection Time: 08/13/22 12:17 PM  Result Value Ref Range   WBC 14.2 (H) 4.0 - 10.5 K/uL   RBC 4.17 3.87 - 5.11 MIL/uL   Hemoglobin 12.3 12.0 - 15.0 g/dL   HCT 35.0 (L) 36.0 - 46.0 %   MCV 83.9 80.0 - 100.0 fL   MCH 29.5 26.0 - 34.0 pg   MCHC 35.1 30.0 - 36.0 g/dL   RDW 13.2 11.5 - 15.5 %   Platelets 351 150 - 400 K/uL   nRBC 0.0 0.0 - 0.2 %    Comment: Performed at [facility] [address], [place]  [id number] Comprehensive metabolic panel     Status: Abnormal   Collection Time: 08/13/22 12:19 PM  Result Value Ref Range   Sodium 133 (L) 135 - 145 mmol/L   Potassium 4.0 3.5 - 5.1 mmol/L   Chloride 93 (L) 98 - 111 mmol/L   CO2 23 22 - 32 mmol/L   Glucose, Bld 219 (H) 70 - 99 mg/dL    Comment: Glucose reference range applies only to samples taken after fasting for at least 8 hours.   BUN 13 6 - 20 mg/dL   Creatinine, Ser 0.80 0.44 - 1.00  mg/dL   Calcium 9.4 8.9 - 10.3 mg/dL   Total Protein 7.8 6.5 - 8.1 g/dL   Albumin 3.5 3.5 - 5.0 g/dL   AST 34 15 - 41 U/L   ALT 17 0 - 44 U/L   Alkaline Phosphatase 70 38 - 126 U/L   Total Bilirubin 0.6 0.3 - 1.2 mg/dL   GFR, Estimated >60 >60 mL/min    Comment: (NOTE) Calculated using the CKD-EPI Creatinine Equation (2021)    Anion gap 17 (H) 5 - 15    Comment: Performed at [facility] [address] [place], [place] [address]  Glucose, capillary     Status: Abnormal   Collection Time: 08/13/22 12:19 PM  Result Value Ref Range   Glucose-Capillary 216 (H) 70 - 99 mg/dL    Comment: Glucose reference range applies only to samples taken after fasting for at least 8 hours.  Beta-hydroxybutyric acid     Status:  Abnormal   Collection Time: 08/13/22  1:58 PM  Result Value Ref Range   Beta-Hydroxybutyric Acid 3.56 (H) 0.05 - 0.27 mmol/L    Comment: Performed at [facility] [address] [place], [place] [address]  Glucose, capillary     Status: Abnormal   Collection Time: 08/13/22  3:28 PM  Result Value Ref Range   Glucose-Capillary 173 (H) 70 - 99 mg/dL    Comment: Glucose reference range applies only to samples taken after fasting for at least 8 hours.  Beta-hydroxybutyric acid     Status: Abnormal   Collection Time: 08/13/22  4:33 PM  Result Value Ref Range   Beta-Hydroxybutyric Acid 0.49 (H) 0.05 - 0.27 mmol/L    Comment: Performed at [facility] [address] [place], [place] [address]  Glucose, capillary     Status: None   Collection Time: 08/13/22  6:05 PM  Result Value Ref Range   Glucose-Capillary 99 70 - 99 mg/dL    Comment: Glucose reference range applies only to samples taken after fasting for at least 8 hours.  Glucose, capillary     Status: Abnormal   Collection Time: 08/13/22  6:40 PM  Result Value Ref Range   Glucose-Capillary 67 (L) 70 - 99 mg/dL    Comment: Glucose reference range applies only to samples taken after fasting for at least 8 hours.  Glucose, capillary     Status: Abnormal   Collection Time: 08/13/22  7:11 PM  Result Value Ref Range   Glucose-Capillary 130 (H) 70 - 99 mg/dL    Comment: Glucose reference range applies only to samples taken after fasting for at least 8 hours.  Basic metabolic panel     Status: Abnormal   Collection Time: 08/13/22  8:20 PM  Result Value Ref Range   Sodium 134 (L) 135 - 145 mmol/L   Potassium 3.8 3.5 - 5.1 mmol/L   Chloride 102 98 - 111 mmol/L   CO2 23 22 - 32 mmol/L   Glucose, Bld 92 70 - 99 mg/dL    Comment: Glucose reference range applies only to samples taken after fasting for at least 8 hours.   BUN 11 6 - 20 mg/dL   Creatinine, Ser 0.61 0.44 - 1.00 mg/dL   Calcium 8.5 (L) 8.9 - 10.3 mg/dL   GFR,  Estimated >60 >60 mL/min    Comment: (NOTE) Calculated using the CKD-EPI Creatinine Equation (2021)    Anion gap 9 5 - 15    Comment: Performed at [facility] [address], [place], [place] [address]  Glucose, capillary     Status: None   Collection Time: 08/13/22  8:22 PM  Result Value Ref Range   Glucose-Capillary 97 70 - 99 mg/dL    Comment: Glucose reference range applies only to samples taken after fasting for at least 8 hours.  Glucose, capillary     Status: Abnormal   Collection Time: 08/13/22  9:25 PM  Result Value Ref Range   Glucose-Capillary 155 (H) 70 - 99 mg/dL    Comment: Glucose reference range applies only to samples taken after fasting for at least 8 hours.  Glucose, capillary     Status: Abnormal   Collection Time: 08/13/22 10:30 PM  Result Value Ref Range   Glucose-Capillary 358 (H) 70 - 99 mg/dL    Comment: Glucose reference range applies only to samples taken after fasting for at least 8 hours.  Glucose, capillary     Status: Abnormal   Collection Time: 08/13/22 11:02 PM  Result Value Ref Range   Glucose-Capillary 270 (H) 70 - 99 mg/dL    Comment: Glucose reference range applies only to samples taken after fasting for at least 8 hours.  Glucose, capillary     Status: Abnormal   Collection Time: 08/13/22 11:35 PM  Result Value Ref Range   Glucose-Capillary 226 (H) 70 - 99 mg/dL    Comment: Glucose reference range applies only to samples taken after fasting for at least 8 hours.  Glucose, capillary     Status: Abnormal   Collection Time: 08/14/22 12:08 AM  Result Value Ref Range   Glucose-Capillary 217 (H) 70 - 99 mg/dL    Comment: Glucose reference range applies only to samples taken after fasting for at least 8 hours.  Basic metabolic panel     Status: Abnormal   Collection Time: 08/14/22 12:11 AM  Result Value Ref Range   Sodium 130 (L) 135 - 145 mmol/L   Potassium 3.1 (L) 3.5 - 5.1 mmol/L   Chloride 102 98 - 111 mmol/L   CO2 22 22 - 32 mmol/L    Glucose, Bld 203 (H) 70 - 99 mg/dL    Comment: Glucose reference range applies only to samples taken after fasting for at least 8 hours.   BUN 8 6 - 20 mg/dL   Creatinine, Ser 0.71 0.44 - 1.00 mg/dL   Calcium 8.1 (L) 8.9 - 10.3 mg/dL   GFR, Estimated >60 >60 mL/min    Comment: (NOTE) Calculated using the CKD-EPI Creatinine Equation (2021)    Anion gap 6 5 - 15    Comment: Performed at [facility] [address], [place], [place] [address]  Beta-hydroxybutyric acid     Status: Abnormal   Collection Time: 08/14/22 12:11 AM  Result Value Ref Range   Beta-Hydroxybutyric Acid 0.31 (H) 0.05 - 0.27 mmol/L    Comment: Performed at [facility] [address], [place], [place] [address]  Glucose, capillary     Status: Abnormal   Collection Time: 08/14/22 12:44 AM  Result Value Ref Range   Glucose-Capillary 188 (H) 70 - 99 mg/dL    Comment: Glucose reference range applies only to samples taken after fasting for at least 8 hours.  Glucose, capillary     Status: Abnormal   Collection Time: 08/14/22  1:44 AM  Result Value Ref Range   Glucose-Capillary 168 (H) 70 - 99 mg/dL    Comment: Glucose reference range applies only to samples  taken after fasting for at least 8 hours.   Comment 1 Notify RN   Glucose, capillary     Status: Abnormal   Collection Time: 08/14/22  2:44 AM  Result Value Ref Range   Glucose-Capillary 141 (H) 70 - 99 mg/dL    Comment: Glucose reference range applies only to samples taken after fasting for at least 8 hours.  Glucose, capillary     Status: Abnormal   Collection Time: 08/14/22  3:45 AM  Result Value Ref Range   Glucose-Capillary 132 (H) 70 - 99 mg/dL    Comment: Glucose reference range applies only to samples taken after fasting for at least 8 hours.  Glucose, capillary     Status: Abnormal   Collection Time: 08/14/22  4:48 AM  Result Value Ref Range   Glucose-Capillary 134 (H) 70 - 99 mg/dL    Comment: Glucose reference range applies only to  samples taken after fasting for at least 8 hours.   Comment 1 Notify RN   Basic metabolic panel     Status: Abnormal   Collection Time: 08/14/22  4:50 AM  Result Value Ref Range   Sodium 134 (L) 135 - 145 mmol/L   Potassium 3.2 (L) 3.5 - 5.1 mmol/L   Chloride 102 98 - 111 mmol/L   CO2 22 22 - 32 mmol/L   Glucose, Bld 126 (H) 70 - 99 mg/dL    Comment: Glucose reference range applies only to samples taken after fasting for at least 8 hours.   BUN 7 6 - 20 mg/dL   Creatinine, Ser 0.51 0.44 - 1.00 mg/dL   Calcium 8.3 (L) 8.9 - 10.3 mg/dL   GFR, Estimated >60 >60 mL/min    Comment: (NOTE) Calculated using the CKD-EPI Creatinine Equation (2021)    Anion gap 10 5 - 15    Comment: Performed at [facility] [address] [place], [place] [address]  Hemoglobin A1c     Status: Abnormal   Collection Time: 08/14/22  4:50 AM  Result Value Ref Range   Hgb A1c MFr Bld 6.4 (H) 4.8 - 5.6 %    Comment: (NOTE) Pre diabetes:          5.7%-6.4%  Diabetes:              >6.4%  Glycemic control for   <7.0% adults with diabetes    Mean Plasma Glucose 136.98 mg/dL    Comment: Performed at [facility] [address] [place], [place] [address]  Glucose, capillary     Status: Abnormal   Collection Time: 08/14/22  5:51 AM  Result Value Ref Range   Glucose-Capillary 127 (H) 70 - 99 mg/dL    Comment: Glucose reference range applies only to samples taken after fasting for at least 8 hours.   Comment 1 Notify RN   Glucose, capillary     Status: Abnormal   Collection Time: 08/14/22  6:53 AM  Result Value Ref Range   Glucose-Capillary 165 (H) 70 - 99 mg/dL    Comment: Glucose reference range applies only to samples taken after fasting for at least 8 hours.  Glucose, capillary     Status: Abnormal   Collection Time: 08/14/22  7:58 AM  Result Value Ref Range   Glucose-Capillary 147 (H) 70 - 99 mg/dL    Comment: Glucose reference range applies only to samples taken after fasting for at  least 8 hours.  Basic metabolic panel     Status: Abnormal  Collection Time: 08/14/22  8:05 AM  Result Value Ref Range   Sodium 132 (L) 135 - 145 mmol/L   Potassium 3.0 (L) 3.5 - 5.1 mmol/L   Chloride 102 98 - 111 mmol/L   CO2 22 22 - 32 mmol/L   Glucose, Bld 134 (H) 70 - 99 mg/dL    Comment: Glucose reference range applies only to samples taken after fasting for at least 8 hours.   BUN 6 6 - 20 mg/dL   Creatinine, Ser 0.61 0.44 - 1.00 mg/dL   Calcium 8.2 (L) 8.9 - 10.3 mg/dL   GFR, Estimated >60 >60 mL/min    Comment: (NOTE) Calculated using the CKD-EPI Creatinine Equation (2021)    Anion gap 8 5 - 15    Comment: Performed at [facility] [address] [place], [place] [address]  Beta-hydroxybutyric acid     Status: None   Collection Time: 08/14/22  8:05 AM  Result Value Ref Range   Beta-Hydroxybutyric Acid 0.23 0.05 - 0.27 mmol/L    Comment: Performed at [facility] [address], [place], [place] [address]  Glucose, capillary     Status: Abnormal   Collection Time: 08/14/22  9:06 AM  Result Value Ref Range   Glucose-Capillary 134 (H) 70 - 99 mg/dL    Comment: Glucose reference range applies only to samples taken after fasting for at least 8 hours.  Glucose, capillary     Status: Abnormal   Collection Time: 08/14/22 10:20 AM  Result Value Ref Range   Glucose-Capillary 133 (H) 70 - 99 mg/dL    Comment: Glucose reference range applies only to samples taken after fasting for at least 8 hours.  Glucose, capillary     Status: Abnormal   Collection Time: 08/14/22 11:32 AM  Result Value Ref Range   Glucose-Capillary 129 (H) 70 - 99 mg/dL    Comment: Glucose reference range applies only to samples taken after fasting for at least 8 hours.  Glucose, capillary     Status: Abnormal   Collection Time: 08/14/22 12:43 PM  Result Value Ref Range   Glucose-Capillary 127 (H) 70 - 99 mg/dL    Comment: Glucose reference range applies only to samples taken after fasting  for at least 8 hours.  Glucose, capillary     Status: Abnormal   Collection Time: 08/14/22  1:18 PM  Result Value Ref Range   Glucose-Capillary 115 (H) 70 - 99 mg/dL    Comment: Glucose reference range applies only to samples taken after fasting for at least 8 hours.  Glucose, capillary     Status: None   Collection Time: 08/14/22  4:08 PM  Result Value Ref Range   Glucose-Capillary 79 70 - 99 mg/dL    Comment: Glucose reference range applies only to samples taken after fasting for at least 8 hours.  Glucose, capillary     Status: None   Collection Time: 08/14/22  8:10 PM  Result Value Ref Range   Glucose-Capillary 76 70 - 99 mg/dL    Comment: Glucose reference range applies only to samples taken after fasting for at least 8 hours.  Glucose, capillary     Status: None   Collection Time: 08/15/22 12:06 AM  Result Value Ref Range   Glucose-Capillary 77 70 - 99 mg/dL    Comment: Glucose reference range applies only to samples taken after fasting for at least 8 hours.  Glucose, capillary     Status: Abnormal   Collection Time:  08/15/22  4:12 AM  Result Value Ref Range   Glucose-Capillary 43 (LL) 70 - 99 mg/dL    Comment: Glucose reference range applies only to samples taken after fasting for at least 8 hours.   Comment 1 Notify RN   Glucose, capillary     Status: Abnormal   Collection Time: 08/15/22  4:31 AM  Result Value Ref Range   Glucose-Capillary 39 (LL) 70 - 99 mg/dL    Comment: Glucose reference range applies only to samples taken after fasting for at least 8 hours.   Comment 1 Notify RN   Glucose, capillary     Status: Abnormal   Collection Time: 08/15/22  4:46 AM  Result Value Ref Range   Glucose-Capillary 63 (L) 70 - 99 mg/dL    Comment: Glucose reference range applies only to samples taken after fasting for at least 8 hours.  Glucose, capillary     Status: None   Collection Time: 08/15/22  5:02 AM  Result Value Ref Range   Glucose-Capillary 78 70 - 99 mg/dL     Comment: Glucose reference range applies only to samples taken after fasting for at least 8 hours.  Basic metabolic panel     Status: Abnormal   Collection Time: 08/15/22  5:24 AM  Result Value Ref Range   Sodium 135 135 - 145 mmol/L   Potassium 3.5 3.5 - 5.1 mmol/L   Chloride 102 98 - 111 mmol/L   CO2 21 (L) 22 - 32 mmol/L   Glucose, Bld 103 (H) 70 - 99 mg/dL    Comment: Glucose reference range applies only to samples taken after fasting for at least 8 hours.   BUN <5 (L) 6 - 20 mg/dL   Creatinine, Ser 0.65 0.44 - 1.00 mg/dL   Calcium 9.0 8.9 - 10.3 mg/dL   GFR, Estimated >60 >60 mL/min    Comment: (NOTE) Calculated using the CKD-EPI Creatinine Equation (2021)    Anion gap 12 5 - 15    Comment: Performed at [facility] [address], [place], [place] [address]  Glucose, capillary     Status: Abnormal   Collection Time: 08/15/22  8:10 AM  Result Value Ref Range   Glucose-Capillary 136 (H) 70 - 99 mg/dL    Comment: Glucose reference range applies only to samples taken after fasting for at least 8 hours.    [place] MFM UA CORD DOPPLER  Result Date: 08/14/2022 ----------------------------------------------------------------------  OBSTETRICS REPORT                        (Signed Final 08/14/2022 10:34 am) ---------------------------------------------------------------------- Patient Info  ID #:       [id number]                          D.O.B.:  [birth year] (26 yrs)  Name:       [facility]              Visit Date: 08/14/2022 09:09 am ---------------------------------------------------------------------- Performed By  Attending:        Sander Nephew      Ref. Address:      [address]                    MD  [place], [place]  Performed By:     Stephenie Acres        Secondary Phy.:     [facility] MAU/Triage                    BS RDMS  Referred By:      Annice Needy              [facility] MD                                [place] ---------------------------------------------------------------------- Orders  #  Description                           Code        Ordered By  1  [place] MFM UA CORD DOPPLER                76820.02    [place]  2  [place] MFM OB LIMITED                     76815.01    Radene Gunning ----------------------------------------------------------------------  #  Order #                     Accession #                Episode #  1  [id number]                   [id number]                 [id number]  2  [id number]                   [id number]                 [id number] ---------------------------------------------------------------------- Indications  Maternal care for known or suspected poor       O36.5920  fetal growth, second trimester, not applicable  or unspecified IUGR  Diabetes - Pregestational, 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 Cord Insertion:       Previously visualized  Amniotic Fluid  AFI FV:      Within normal limits                              Largest Pocket(cm)                              4.8 ---------------------------------------------------------------------- Biometry  LV:        3.9  mm ---------------------------------------------------------------------- OB History  Gravidity:    1 ---------------------------------------------------------------------- Gestational Age  LMP:           26w 4d        Date:  02/09/22                  EDD:   11/16/22  Best:          Vicenta Dunning 4d      Det. By:  LMP  (02/09/22)          EDD:   11/16/22 ---------------------------------------------------------------------- Anatomy  Cranium:               Appears normal         Diaphragm:              Appears normal  Cavum:                 Appears normal         Stomach:                Appears normal, left                                                                        sided  Ventricles:            Appears normal         Kidneys:                Appear normal  Heart:                 Small Pericardial      Bladder:                Appears normal                         effusion ---------------------------------------------------------------------- Doppler - Fetal Vessels  Umbilical Artery    S/D    %tile      RI    %tile      PI    %tile     PSV    ADFV    RDFV                                                     (  cm/s)    3.1       46     0.7       63     1.1       61     47.9      No      No ---------------------------------------------------------------------- Impression  Antenatal testing due to IUGR with an EFW 6th%  There is good fetal movement and amniotic fluid volume  UA Dopplers are normal with no evidence of AEDF or REDF  There is a small physiologic pericardial effusion. ---------------------------------------------------------------------- Recommendations  Continue weekly testing with UA Dopplers and serial growth. ----------------------------------------------------------------------              Sander Nephew, MD Electronically Signed Final Report   08/14/2022 10:34 am ----------------------------------------------------------------------  [place] MFM OB LIMITED  Result Date: 08/14/2022 ----------------------------------------------------------------------  OBSTETRICS REPORT                        (Signed Final 08/14/2022 10:34 am) ---------------------------------------------------------------------- Patient Info  ID #:       [id number]                          D.O.B.:  June 23, 1996 (26 yrs)  Name:        [facility]              Visit Date: 08/14/2022 09:09 am ---------------------------------------------------------------------- Performed By  Attending:        Sander Nephew      Ref. Address:      [place]                                                              [place], [place]  Performed By:     Stephenie Acres        Secondary Phy.:    [facility] MAU/Triage                    BS RDMS  Referred By:      Annice Needy              [facility]                    ECKSTAT MD  [place] ---------------------------------------------------------------------- Orders  #  Description                           Code        Ordered By  1  [place] MFM UA CORD DOPPLER                76820.02    Radene Gunning  2  [place] MFM OB LIMITED                     [id number]    Radene Gunning ----------------------------------------------------------------------  #  Order #                     Accession #                Episode #  1  [id number]                   [id number]                 [id number]  2  [id number]                   [id number]                 [id number] ---------------------------------------------------------------------- Indications  Maternal care for known or suspected poor       O36.5920  fetal growth, second trimester, not applicable  or unspecified IUGR  Diabetes - Pregestational, 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 Cord Insertion:       Previously  visualized  Amniotic Fluid  AFI FV:      Within normal limits                              Largest Pocket(cm)                              4.8 ---------------------------------------------------------------------- Biometry  LV:        3.9  mm ---------------------------------------------------------------------- OB History  Gravidity:    1 ---------------------------------------------------------------------- Gestational Age  LMP:           26w 4d        Date:  02/09/22                  EDD:   11/16/22  Best:          Vicenta Dunning 4d     Det. By:  LMP  (02/09/22)          EDD:   11/16/22 ---------------------------------------------------------------------- Anatomy  Cranium:  Appears normal         Diaphragm:              Appears normal  Cavum:                 Appears normal         Stomach:                Appears normal, left                                                                        sided  Ventricles:            Appears normal         Kidneys:                Appear normal  Heart:                 Small Pericardial      Bladder:                Appears normal                         effusion ---------------------------------------------------------------------- Doppler - Fetal Vessels  Umbilical Artery    S/D    %tile      RI    %tile      PI    %tile     PSV    ADFV    RDFV                                                     (cm/s)    3.1       46     0.7       63     1.1       61     47.9      No      No ---------------------------------------------------------------------- Impression  Antenatal testing due to IUGR with an EFW 6th%  There is good fetal movement and amniotic fluid volume  UA Dopplers are normal with no evidence of AEDF or REDF  There is a small physiologic pericardial effusion. ---------------------------------------------------------------------- Recommendations  Continue weekly testing with UA Dopplers and serial growth.  ----------------------------------------------------------------------              Sander Nephew, MD Electronically Signed Final Report   08/14/2022 10:34 am ----------------------------------------------------------------------   Current scheduled medications  docusate sodium  100 mg Oral Daily   enoxaparin (LOVENOX) injection  40 mg Subcutaneous O67T   folic acid  1 mg Oral Daily   Or   folic acid  1 mg Intravenous Daily   insulin pump   Subcutaneous Q4H   metoCLOPramide  10 mg Oral Q6H   Or   metoCLOPramide (REGLAN) injection  10 mg Intravenous Q6H   prenatal multivitamin  1 tablet Oral Q1200   scopolamine  1 patch Transdermal Q72H   sertraline  50 mg Oral Daily   thiamine  100 mg Oral Daily   Or  thiamine  100 mg Intravenous Daily    I have reviewed the patient's current medications.  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 - Appreciate DM educator's input once seen today - from N/V perspective she can go home but will ensure any final titrations of Omnipod today prior to this.  - Potassium normal today.   N/V of pregnancy - Continue anti-emetics: Zofran, reglan, phenergan and scop patch. She feels the reglan and promethazine work best. Will encourage PO anti-emetics  FGR - Stable FGR from review of [place] reports. Last growth was 1/9 and was 6%ile, normal AFI, normal UADs. UADs normal yesterday. FHT q shift. NST to start at 28w.   Routine antenatal care - Routine PNC is up to date.    Radene Gunning, MD, [place] for [facility], [place]

## 2022-08-15 NOTE — Discharge Summary (Signed)
Antenatal Physician Discharge Summary  Patient ID: Sabrina Sutton MRN: [id number] DOB/AGE: 1996-03-18 27 y.o.  Admit date: 08/13/2022 Discharge date: 08/15/2022  Admission Diagnoses: Nausea/vomiting in pregnancy [O21.9] DKA (diabetic ketoacidosis) ([place]) [E11.10]  Discharge Diagnoses:  Same  Prenatal Procedures: NST and ultrasound  Consults: DM coordinator  Hospital Course:  Sabrina Sutton is a 27 y.o. G1P0 with IUP at 20w5dadmitted for DKA and N/V in pregnancy in setting of T1DM. She has already had multiple admissions for this in the pregnancy.  She was admitted for fluids, electrolyte repletion and insulin. She was initially on Endotool and then transitioned back to her Dexcom and Omnipod once her ketones were cleared. Her potassium was repleted. Her nausea and emesis were managed with antiemetics such that by day of discharge she was tolerating PO with a regular breakfast and she felt much improved.   She also has a pregnancy affected by FGR with normal dopplers. She was due for her routine weekly dopplers on Monday which were normal. She had been on CEFM initially which was normal for gestational age and this was discontinued once she was resumed on her O56   Her Omnipod was reduced to 0.4 units/hour because she still had low CBGs overnight (had been on 0.7). Short term follow up was arranged for her to see ALevada Dyin one week and she has an appointment with the endocrinology NP on 1/30.   [facility] and [facility] [place] already [place] and she will continue these.   Discharge Exam: Temp:  [97.4 F (36.3 C)-97.9 F (36.6 C)] 97.8 F (36.6 C) (01/23 1221) Pulse Rate:  [82-107] 85 (01/23 1221) Resp:  [16-20] 20 (01/23 1221) BP: (98-132)/(54-87) 98/65 (01/23 1221) SpO2:  [98 %-100 %] 100 % (01/23 1221) Weight:  [53.2 kg] 53.2 kg (01/23 0408) Physical Examination: CONSTITUTIONAL: Well-developed, well-nourished female in no  acute distress.  HENT:  Normocephalic, atraumatic, External right and left ear normal.  EYES: Conjunctivae and EOM are normal. Pupils are equal, round, and reactive to light. No scleral icterus.  NECK: Normal range of motion, supple, no masses SKIN: Skin is warm and dry. No rash noted. Not diaphoretic. No erythema. No pallor. NEUROLOGIC: Alert and oriented to person, place, and time. Normal reflexes, muscle tone coordination. No cranial nerve deficit noted. PSYCHIATRIC: Normal mood and affect. Normal behavior. Normal judgment and thought content. CARDIOVASCULAR: Normal heart rate noted,  RESPIRATORY: Effort normal, no problems with respiration noted MUSCULOSKELETAL: Normal range of motion. No edema and no tenderness. 2+ distal pulses. ABDOMEN: Soft, nontender, nondistended, gravid. CERVIX:  Not examined   Significant Diagnostic Studies:  Results for orders placed or performed during the hospital encounter of 08/13/22 (from the past 168 hour(s))  Urinalysis, Routine w reflex microscopic Urine, Clean Catch   Collection Time: 08/13/22 11:40 AM  Result Value Ref Range   Color, Urine YELLOW YELLOW   APPearance CLOUDY (A) CLEAR   Specific Gravity, Urine 1.020 1.005 - 1.030   pH 6.0 5.0 - 8.0   Glucose, UA >=500 (A) NEGATIVE mg/dL   Hgb urine dipstick NEGATIVE NEGATIVE   Bilirubin Urine NEGATIVE NEGATIVE   Ketones, ur 80 (A) NEGATIVE mg/dL   Protein, ur 100 (A) NEGATIVE mg/dL   Nitrite NEGATIVE NEGATIVE   Leukocytes,Ua NEGATIVE NEGATIVE   RBC / HPF 0-5 0 - 5 RBC/hpf   WBC, UA 0-5 0 - 5 WBC/hpf   Bacteria, UA RARE (A) NONE SEEN   Squamous Epithelial / HPF 11-20 0 -  5 /HPF   Mucus PRESENT   CBC   Collection Time: 08/13/22 12:17 PM  Result Value Ref Range   WBC 14.2 (H) 4.0 - 10.5 K/uL   RBC 4.17 3.87 - 5.11 MIL/uL   Hemoglobin 12.3 12.0 - 15.0 g/dL   HCT 35.0 (L) 36.0 - 46.0 %   MCV 83.9 80.0 - 100.0 fL   MCH 29.5 26.0 - 34.0 pg   MCHC 35.1 30.0 - 36.0 g/dL   RDW 13.2 11.5 - 15.5  %   Platelets 351 150 - 400 K/uL   nRBC 0.0 0.0 - 0.2 %  Comprehensive metabolic panel   Collection Time: 08/13/22 12:19 PM  Result Value Ref Range   Sodium 133 (L) 135 - 145 mmol/L   Potassium 4.0 3.5 - 5.1 mmol/L   Chloride 93 (L) 98 - 111 mmol/L   CO2 23 22 - 32 mmol/L   Glucose, Bld 219 (H) 70 - 99 mg/dL   BUN 13 6 - 20 mg/dL   Creatinine, Ser 0.80 0.44 - 1.00 mg/dL   Calcium 9.4 8.9 - 10.3 mg/dL   Total Protein 7.8 6.5 - 8.1 g/dL   Albumin 3.5 3.5 - 5.0 g/dL   AST 34 15 - 41 U/L   ALT 17 0 - 44 U/L   Alkaline Phosphatase 70 38 - 126 U/L   Total Bilirubin 0.6 0.3 - 1.2 mg/dL   GFR, Estimated >60 >60 mL/min   Anion gap 17 (H) 5 - 15  Glucose, capillary   Collection Time: 08/13/22 12:19 PM  Result Value Ref Range   Glucose-Capillary 216 (H) 70 - 99 mg/dL  Beta-hydroxybutyric acid   Collection Time: 08/13/22  1:58 PM  Result Value Ref Range   Beta-Hydroxybutyric Acid 3.56 (H) 0.05 - 0.27 mmol/L  Glucose, capillary   Collection Time: 08/13/22  3:28 PM  Result Value Ref Range   Glucose-Capillary 173 (H) 70 - 99 mg/dL  Beta-hydroxybutyric acid   Collection Time: 08/13/22  4:33 PM  Result Value Ref Range   Beta-Hydroxybutyric Acid 0.49 (H) 0.05 - 0.27 mmol/L  Glucose, capillary   Collection Time: 08/13/22  6:05 PM  Result Value Ref Range   Glucose-Capillary 99 70 - 99 mg/dL  Glucose, capillary   Collection Time: 08/13/22  6:40 PM  Result Value Ref Range   Glucose-Capillary 67 (L) 70 - 99 mg/dL  Glucose, capillary   Collection Time: 08/13/22  7:11 PM  Result Value Ref Range   Glucose-Capillary 130 (H) 70 - 99 mg/dL  Basic metabolic panel   Collection Time: 08/13/22  8:20 PM  Result Value Ref Range   Sodium 134 (L) 135 - 145 mmol/L   Potassium 3.8 3.5 - 5.1 mmol/L   Chloride 102 98 - 111 mmol/L   CO2 23 22 - 32 mmol/L   Glucose, Bld 92 70 - 99 mg/dL   BUN 11 6 - 20 mg/dL   Creatinine, Ser 0.61 0.44 - 1.00 mg/dL   Calcium 8.5 (L) 8.9 - 10.3 mg/dL   GFR,  Estimated >60 >60 mL/min   Anion gap 9 5 - 15  Glucose, capillary   Collection Time: 08/13/22  8:22 PM  Result Value Ref Range   Glucose-Capillary 97 70 - 99 mg/dL  Glucose, capillary   Collection Time: 08/13/22  9:25 PM  Result Value Ref Range   Glucose-Capillary 155 (H) 70 - 99 mg/dL  Glucose, capillary   Collection Time: 08/13/22 10:30 PM  Result Value Ref Range   Glucose-Capillary 358 (  H) 70 - 99 mg/dL  Glucose, capillary   Collection Time: 08/13/22 11:02 PM  Result Value Ref Range   Glucose-Capillary 270 (H) 70 - 99 mg/dL  Glucose, capillary   Collection Time: 08/13/22 11:35 PM  Result Value Ref Range   Glucose-Capillary 226 (H) 70 - 99 mg/dL  Glucose, capillary   Collection Time: 08/14/22 12:08 AM  Result Value Ref Range   Glucose-Capillary 217 (H) 70 - 99 mg/dL  Basic metabolic panel   Collection Time: 08/14/22 12:11 AM  Result Value Ref Range   Sodium 130 (L) 135 - 145 mmol/L   Potassium 3.1 (L) 3.5 - 5.1 mmol/L   Chloride 102 98 - 111 mmol/L   CO2 22 22 - 32 mmol/L   Glucose, Bld 203 (H) 70 - 99 mg/dL   BUN 8 6 - 20 mg/dL   Creatinine, Ser 0.71 0.44 - 1.00 mg/dL   Calcium 8.1 (L) 8.9 - 10.3 mg/dL   GFR, Estimated >60 >60 mL/min   Anion gap 6 5 - 15  Beta-hydroxybutyric acid   Collection Time: 08/14/22 12:11 AM  Result Value Ref Range   Beta-Hydroxybutyric Acid 0.31 (H) 0.05 - 0.27 mmol/L  Glucose, capillary   Collection Time: 08/14/22 12:44 AM  Result Value Ref Range   Glucose-Capillary 188 (H) 70 - 99 mg/dL  Glucose, capillary   Collection Time: 08/14/22  1:44 AM  Result Value Ref Range   Glucose-Capillary 168 (H) 70 - 99 mg/dL   Comment 1 Notify RN   Glucose, capillary   Collection Time: 08/14/22  2:44 AM  Result Value Ref Range   Glucose-Capillary 141 (H) 70 - 99 mg/dL  Glucose, capillary   Collection Time: 08/14/22  3:45 AM  Result Value Ref Range   Glucose-Capillary 132 (H) 70 - 99 mg/dL  Glucose, capillary   Collection Time: 08/14/22  4:48  AM  Result Value Ref Range   Glucose-Capillary 134 (H) 70 - 99 mg/dL   Comment 1 Notify RN   Basic metabolic panel   Collection Time: 08/14/22  4:50 AM  Result Value Ref Range   Sodium 134 (L) 135 - 145 mmol/L   Potassium 3.2 (L) 3.5 - 5.1 mmol/L   Chloride 102 98 - 111 mmol/L   CO2 22 22 - 32 mmol/L   Glucose, Bld 126 (H) 70 - 99 mg/dL   BUN 7 6 - 20 mg/dL   Creatinine, Ser 0.51 0.44 - 1.00 mg/dL   Calcium 8.3 (L) 8.9 - 10.3 mg/dL   GFR, Estimated >60 >60 mL/min   Anion gap 10 5 - 15  Hemoglobin A1c   Collection Time: 08/14/22  4:50 AM  Result Value Ref Range   Hgb A1c MFr Bld 6.4 (H) 4.8 - 5.6 %   Mean Plasma Glucose 136.98 mg/dL  Glucose, capillary   Collection Time: 08/14/22  5:51 AM  Result Value Ref Range   Glucose-Capillary 127 (H) 70 - 99 mg/dL   Comment 1 Notify RN   Glucose, capillary   Collection Time: 08/14/22  6:53 AM  Result Value Ref Range   Glucose-Capillary 165 (H) 70 - 99 mg/dL  Glucose, capillary   Collection Time: 08/14/22  7:58 AM  Result Value Ref Range   Glucose-Capillary 147 (H) 70 - 99 mg/dL  Basic metabolic panel   Collection Time: 08/14/22  8:05 AM  Result Value Ref Range   Sodium 132 (L) 135 - 145 mmol/L   Potassium 3.0 (L) 3.5 - 5.1 mmol/L   Chloride 102  98 - 111 mmol/L   CO2 22 22 - 32 mmol/L   Glucose, Bld 134 (H) 70 - 99 mg/dL   BUN 6 6 - 20 mg/dL   Creatinine, Ser 0.61 0.44 - 1.00 mg/dL   Calcium 8.2 (L) 8.9 - 10.3 mg/dL   GFR, Estimated >60 >60 mL/min   Anion gap 8 5 - 15  Beta-hydroxybutyric acid   Collection Time: 08/14/22  8:05 AM  Result Value Ref Range   Beta-Hydroxybutyric Acid 0.23 0.05 - 0.27 mmol/L  Glucose, capillary   Collection Time: 08/14/22  9:06 AM  Result Value Ref Range   Glucose-Capillary 134 (H) 70 - 99 mg/dL  Glucose, capillary   Collection Time: 08/14/22 10:20 AM  Result Value Ref Range   Glucose-Capillary 133 (H) 70 - 99 mg/dL  Glucose, capillary   Collection Time: 08/14/22 11:32 AM  Result Value Ref  Range   Glucose-Capillary 129 (H) 70 - 99 mg/dL  Glucose, capillary   Collection Time: 08/14/22 12:43 PM  Result Value Ref Range   Glucose-Capillary 127 (H) 70 - 99 mg/dL  Glucose, capillary   Collection Time: 08/14/22  1:18 PM  Result Value Ref Range   Glucose-Capillary 115 (H) 70 - 99 mg/dL  Glucose, capillary   Collection Time: 08/14/22  4:08 PM  Result Value Ref Range   Glucose-Capillary 79 70 - 99 mg/dL  Glucose, capillary   Collection Time: 08/14/22  8:10 PM  Result Value Ref Range   Glucose-Capillary 76 70 - 99 mg/dL  Glucose, capillary   Collection Time: 08/15/22 12:06 AM  Result Value Ref Range   Glucose-Capillary 77 70 - 99 mg/dL  Glucose, capillary   Collection Time: 08/15/22  4:12 AM  Result Value Ref Range   Glucose-Capillary 43 (LL) 70 - 99 mg/dL   Comment 1 Notify RN   Glucose, capillary   Collection Time: 08/15/22  4:31 AM  Result Value Ref Range   Glucose-Capillary 39 (LL) 70 - 99 mg/dL   Comment 1 Notify RN   Glucose, capillary   Collection Time: 08/15/22  4:46 AM  Result Value Ref Range   Glucose-Capillary 63 (L) 70 - 99 mg/dL  Glucose, capillary   Collection Time: 08/15/22  5:02 AM  Result Value Ref Range   Glucose-Capillary 78 70 - 99 mg/dL  Basic metabolic panel   Collection Time: 08/15/22  5:24 AM  Result Value Ref Range   Sodium 135 135 - 145 mmol/L   Potassium 3.5 3.5 - 5.1 mmol/L   Chloride 102 98 - 111 mmol/L   CO2 21 (L) 22 - 32 mmol/L   Glucose, Bld 103 (H) 70 - 99 mg/dL   BUN <5 (L) 6 - 20 mg/dL   Creatinine, Ser 0.65 0.44 - 1.00 mg/dL   Calcium 9.0 8.9 - 10.3 mg/dL   GFR, Estimated >60 >60 mL/min   Anion gap 12 5 - 15  Glucose, capillary   Collection Time: 08/15/22  8:10 AM  Result Value Ref Range   Glucose-Capillary 136 (H) 70 - 99 mg/dL  Glucose, capillary   Collection Time: 08/15/22 12:19 PM  Result Value Ref Range   Glucose-Capillary 114 (H) 70 - 99 mg/dL  Results for orders placed or performed during the hospital  encounter of 08/12/22 (from the past 168 hour(s))  Urinalysis, Routine w reflex microscopic Urine, Clean Catch   Collection Time: 08/12/22 11:11 AM  Result Value Ref Range   Color, Urine YELLOW YELLOW   APPearance HAZY (A) CLEAR   Specific  Gravity, Urine 1.019 1.005 - 1.030   pH 8.0 5.0 - 8.0   Glucose, UA NEGATIVE NEGATIVE mg/dL   Hgb urine dipstick NEGATIVE NEGATIVE   Bilirubin Urine NEGATIVE NEGATIVE   Ketones, ur NEGATIVE NEGATIVE mg/dL   Protein, ur 30 (A) NEGATIVE mg/dL   Nitrite NEGATIVE NEGATIVE   Leukocytes,Ua NEGATIVE NEGATIVE   RBC / HPF 0-5 0 - 5 RBC/hpf   WBC, UA 0-5 0 - 5 WBC/hpf   Bacteria, UA RARE (A) NONE SEEN   Squamous Epithelial / HPF 6-10 0 - 5 /HPF   Mucus PRESENT   Results for orders placed or performed during the hospital encounter of 08/09/22 (from the past 168 hour(s))  Urinalysis, Routine w reflex microscopic Urine, Clean Catch   Collection Time: 08/09/22 10:50 AM  Result Value Ref Range   Color, Urine YELLOW YELLOW   APPearance HAZY (A) CLEAR   Specific Gravity, Urine 1.015 1.005 - 1.030   pH 7.0 5.0 - 8.0   Glucose, UA NEGATIVE NEGATIVE mg/dL   Hgb urine dipstick NEGATIVE NEGATIVE   Bilirubin Urine NEGATIVE NEGATIVE   Ketones, ur NEGATIVE NEGATIVE mg/dL   Protein, ur NEGATIVE NEGATIVE mg/dL   Nitrite NEGATIVE NEGATIVE   Leukocytes,Ua NEGATIVE NEGATIVE  Type and screen [place]   Collection Time: 08/09/22 12:47 PM  Result Value Ref Range   ABO/RH(D) O POS    Antibody Screen NEG    Sample Expiration      08/12/2022,2359 Performed at [facility], [address], [place], [place] [address]   CBC   Collection Time: 08/09/22 12:59 PM  Result Value Ref Range   WBC 10.5 4.0 - 10.5 K/uL   RBC 3.55 (L) 3.87 - 5.11 MIL/uL   Hemoglobin 10.4 (L) 12.0 - 15.0 g/dL   HCT 31.2 (L) 36.0 - 46.0 %   MCV 87.9 80.0 - 100.0 fL   MCH 29.3 26.0 - 34.0 pg   MCHC 33.3 30.0 - 36.0 g/dL   RDW 13.3 11.5 - 15.5 %   Platelets 302 150 -  400 K/uL   nRBC 0.0 0.0 - 0.2 %  Comprehensive metabolic panel   Collection Time: 08/09/22 12:59 PM  Result Value Ref Range   Sodium 132 (L) 135 - 145 mmol/L   Potassium 3.9 3.5 - 5.1 mmol/L   Chloride 100 98 - 111 mmol/L   CO2 23 22 - 32 mmol/L   Glucose, Bld 167 (H) 70 - 99 mg/dL   BUN 9 6 - 20 mg/dL   Creatinine, Ser 0.62 0.44 - 1.00 mg/dL   Calcium 9.1 8.9 - 10.3 mg/dL   Total Protein 6.3 (L) 6.5 - 8.1 g/dL   Albumin 3.0 (L) 3.5 - 5.0 g/dL   AST 17 15 - 41 U/L   ALT 10 0 - 44 U/L   Alkaline Phosphatase 56 38 - 126 U/L   Total Bilirubin 0.2 (L) 0.3 - 1.2 mg/dL   GFR, Estimated >60 >60 mL/min   Anion gap 9 5 - 15   [place] MFM UA CORD DOPPLER  Result Date: 08/14/2022 ----------------------------------------------------------------------  OBSTETRICS REPORT                        (Signed Final 08/14/2022 10:34 am) ---------------------------------------------------------------------- Patient Info  ID #:       [id number]  D.O.B.:  1996/07/10 (26 yrs)  Name:       [facility]              Visit Date: 08/14/2022 09:09 am ---------------------------------------------------------------------- Performed By  Attending:        Sander Nephew      Ref. Address:      [place]                                                              [place], [place]  Performed By:     Stephenie Acres        Secondary Phy.:    [facility] MAU/Triage                    BS RDMS  Referred By:      Annice Needy              Location[facility]                    ECKSTAT MD                                [place] ---------------------------------------------------------------------- Orders  #  Description                           Code        Ordered By  1  [place] MFM UA CORD DOPPLER                76820.02    [place]   2  [place] MFM OB LIMITED                     42706.23    Radene Gunning ----------------------------------------------------------------------  #  Order #                     Accession #                Episode #  1  [id number]                   [id number]                 [id number]  2  [id number]  [id number]                 [id number] ---------------------------------------------------------------------- Indications  Maternal care for known or suspected poor       O36.5920  fetal growth, second trimester, not applicable  or unspecified IUGR  Diabetes - Pregestational, 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 Cord Insertion:       Previously visualized  Amniotic Fluid  AFI FV:      Within normal limits                              Largest Pocket(cm)                              4.8 ---------------------------------------------------------------------- Biometry  LV:        3.9  mm ---------------------------------------------------------------------- OB History  Gravidity:    1 ---------------------------------------------------------------------- Gestational Age  LMP:           26w 4d        Date:  02/09/22                  EDD:   11/16/22  Best:          Vicenta Dunning 4d     Det. By:  LMP  (02/09/22)          EDD:   11/16/22 ---------------------------------------------------------------------- Anatomy  Cranium:               Appears normal         Diaphragm:              Appears normal  Cavum:                 Appears normal         Stomach:                Appears normal, left                                                                        sided  Ventricles:             Appears normal         Kidneys:                Appear normal  Heart:                 Small Pericardial  Bladder:                Appears normal                         effusion ---------------------------------------------------------------------- Doppler - Fetal Vessels  Umbilical Artery    S/D    %tile      RI    %tile      PI    %tile     PSV    ADFV    RDFV                                                     (cm/s)    3.1       46     0.7       63     1.1       61     47.9      No      No ---------------------------------------------------------------------- Impression  Antenatal testing due to IUGR with an EFW 6th%  There is good fetal movement and amniotic fluid volume  UA Dopplers are normal with no evidence of AEDF or REDF  There is a small physiologic pericardial effusion. ---------------------------------------------------------------------- Recommendations  Continue weekly testing with UA Dopplers and serial growth. ----------------------------------------------------------------------              Sander Nephew, MD Electronically Signed Final Report   08/14/2022 10:34 am ----------------------------------------------------------------------  [place] MFM OB LIMITED  Result Date: 08/14/2022 ----------------------------------------------------------------------  OBSTETRICS REPORT                        (Signed Final 08/14/2022 10:34 am) ---------------------------------------------------------------------- Patient Info  ID #:       [id number]                          D.O.B.:  01-May-1996 (26 yrs)  Name:       [facility]              Visit Date: 08/14/2022 09:09 am ---------------------------------------------------------------------- Performed By  Attending:        Sander Nephew      Ref. Address:      [place], [place]  [address]  Performed By:     Stephenie Acres        Secondary Phy.:    [facility] MAU/Triage                    BS RDMS  Referred By:      Annice Needy              Location[facility]          [facility]                    ECKSTAT MD                                [place] ---------------------------------------------------------------------- Orders  #  Description                           Code        Ordered By  1  [place] MFM UA CORD DOPPLER                76820.02    [place]  2  [place] MFM OB LIMITED                     [id number]    Trystyn Sitts ----------------------------------------------------------------------  #  Order #                     Accession #                Episode #  1  [id number]                   [id number]                 [id number]  2  [id number]                   [id number]                 [id number] ---------------------------------------------------------------------- Indications  Maternal care for known or suspected poor       O36.5920  fetal growth, second trimester, not applicable  or unspecified IUGR  Diabetes - Pregestational, 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 Cord Insertion:       Previously visualized  Amniotic Fluid  AFI FV:      Within normal limits                              Largest Pocket(cm)  4.8 ---------------------------------------------------------------------- Biometry  LV:        3.9  mm ---------------------------------------------------------------------- OB History  Gravidity:    1 ----------------------------------------------------------------------  Gestational Age  LMP:           26w 4d        Date:  02/09/22                  EDD:   11/16/22  Best:          Vicenta Dunning 4d     Det. By:  LMP  (02/09/22)          EDD:   11/16/22 ---------------------------------------------------------------------- Anatomy  Cranium:               Appears normal         Diaphragm:              Appears normal  Cavum:                 Appears normal         Stomach:                Appears normal, left                                                                        sided  Ventricles:            Appears normal         Kidneys:                Appear normal  Heart:                 Small Pericardial      Bladder:                Appears normal                         effusion ---------------------------------------------------------------------- Doppler - Fetal Vessels  Umbilical Artery    S/D    %tile      RI    %tile      PI    %tile     PSV    ADFV    RDFV                                                     (cm/s)    3.1       46     0.7       63     1.1       61     47.9      No      No ---------------------------------------------------------------------- Impression  Antenatal testing due to IUGR with an EFW 6th%  There is good fetal movement and amniotic fluid volume  UA Dopplers are normal with no evidence of AEDF or REDF  There is a small physiologic pericardial effusion. ---------------------------------------------------------------------- Recommendations  Continue weekly testing with UA Dopplers and serial growth. ----------------------------------------------------------------------  Sander Nephew, MD Electronically Signed Final Report   08/14/2022 10:34 am ----------------------------------------------------------------------  [place] MFM OB Limited  Result Date: 08/09/2022 ----------------------------------------------------------------------  OBSTETRICS REPORT                       (Signed Final 08/09/2022 02:56 pm)  ---------------------------------------------------------------------- Patient Info  ID #:       [id number]                          D.O.B.:  11-Dec-1995 (26 yrs)  Name:       [facility]              Visit Date: 08/09/2022 01:51 pm ---------------------------------------------------------------------- Performed By  Attending:        Johnell Comings MD         Ref. Address:     [address] [place]                                                             [place], [place]  Performed By:     Berlinda Last          Secondary Phy.:   [facility] MAU/Triage                    RDMS  Referred By:      Annice Needy              Location[facility]         [facility]                    ECKSTAT MD                               [place] ---------------------------------------------------------------------- Orders  #  Description                           Code        Ordered By  1  [place] MFM OB LIMITED                     56213.08    MATTHEW ECKSTAT ----------------------------------------------------------------------  #  Order #                     Accession #                Episode #  1  [id number]  [id number]                 [id number] ---------------------------------------------------------------------- Indications  Diabetes - Pregestational, 2nd trimester       O67.312  Maternal care for known or suspected poor      O36.5920  fetal growth, second trimester, not applicable  or unspecified IUGR  [redacted] weeks gestation of pregnancy                Z3A.25 ---------------------------------------------------------------------- Fetal Evaluation  Num Of Fetuses:         1  Fetal Heart Rate(bpm):  150  Cardiac Activity:       Observed  Presentation:           Transverse, head to maternal left  Placenta:               Anterior  P. Cord Insertion:      Visualized  Amniotic Fluid  AFI FV:      Within  normal limits                              Largest Pocket(cm)                              4.3  Comment:    No placental abruption or previa identified. ---------------------------------------------------------------------- OB History  Gravidity:    1 ---------------------------------------------------------------------- Gestational Age  LMP:           25w 6d        Date:  02/09/22                  EDD:   11/16/22  Best:          25w 6d     Det. By:  LMP  (02/09/22)          EDD:   11/16/22 ---------------------------------------------------------------------- Anatomy  Cranium:               Appears normal         Diaphragm:              Appears normal  Heart:                 Pericardial            Stomach:                Appears normal, left                         effusion                                                                        sided  RVOT:                  Appears normal         Kidneys:                Appear normal  LVOT:                  Appears normal  Bladder:                Appears normal ---------------------------------------------------------------------- Doppler - Fetal Vessels  Umbilical Artery   S/D     %tile      RI    %tile      PI    %tile     PSV    ADFV    RDFV                                                     (cm/s)    2.5       12     0.6       12     0.9       18     39.9      No      No ---------------------------------------------------------------------- Cervix Uterus Adnexa  Cervix  Length:            3.3  cm.  Normal appearance by transabdominal scan  Uterus  No abnormality visualized.  Right Ovary  Within normal limits.  Left Ovary  Within normal limits.  Adnexa  No abnormality visualized ---------------------------------------------------------------------- Comments  This patient presented to the MAU due to abdominal  pain/cramping.  A limited ultrasound performed today shows that the fetus is  in the transverse presentation.  There was normal amniotic fluid noted.   Doppler studies of the umbilical arteries continues to show  normal forward flow with a normal S/D ratio of 2.5.  There  were no signs of absent or reversed end-diastolic flow.  Her cervical length appeared 3.3 cm long without any signs of  funneling.  The patient had a deceleration while in the MAU.  She should  be placed on prolonged monitoring.  Should the patient continue to have contractions, a course of  Indocin may be considered for tocolysis. ----------------------------------------------------------------------                   Johnell Comings, MD Electronically Signed Final Report   08/09/2022 02:56 pm ----------------------------------------------------------------------  [place] MFM OB LIMITED  Result Date: 08/08/2022 ----------------------------------------------------------------------  OBSTETRICS REPORT                       (Signed Final 08/08/2022 11:26 am) ---------------------------------------------------------------------- Patient Info  ID #:       [id number]                          D.O.B.:  08-05-1995 (26 yrs)  Name:       [facility]              Visit Date: 08/08/2022 10:41 am ---------------------------------------------------------------------- Performed By  Attending:        Johnell Comings MD         Ref. Address:     [address]                                                             [place], [place]  [address]  Performed By:     Milus Glazier,      [facility]                                     [facility] at                                                             [place] for                                                             Women  Referred By:      Osborne Oman MD ---------------------------------------------------------------------- Orders  #  Description                           Code        Ordered By  1  [place] MFM OB LIMITED                      76815.01    Valeda Malm  2  [place] MFM UA CORD DOPPLER                76820.02    [facility] ----------------------------------------------------------------------  #  Order #                     Accession #                Episode #  1  [id number]                   [id number]                 [id number]  2  [id number]                   [id number]                 [id number] ---------------------------------------------------------------------- Indications  Maternal care for known or suspected poor      O36.5920  fetal growth, second trimester, not applicable  or unspecified IUGR  Diabetes - Pregestational, 2nd trimester       O35.312  [redacted] weeks gestation of pregnancy                Z3A.25 ---------------------------------------------------------------------- Fetal Evaluation  Num Of Fetuses:         1  Fetal Heart Rate(bpm):  151  Cardiac Activity:       Observed  Presentation:           Breech  Placenta:               Posterior  P. Cord Insertion:  Visualized  Amniotic Fluid  AFI FV:      Within normal limits  AFI Sum(cm)     %Tile       Largest Pocket(cm)  12.79           33          3.99  RUQ(cm)       RLQ(cm)       LUQ(cm)        LLQ(cm)  3.99          2.48          3.31           3.01 ---------------------------------------------------------------------- OB History  Gravidity:    1 ---------------------------------------------------------------------- Gestational Age  LMP:           25w 5d        Date:  02/09/22                  EDD:   11/16/22  Best:          Melvyn Novas 5d     Det. By:  LMP  (02/09/22)          EDD:   11/16/22 ---------------------------------------------------------------------- Anatomy  Cranium:               Appears normal         Aortic Arch:            Previously seen  Cavum:                 Appears normal         Ductal Arch:            Previously seen  Ventricles:            Appears normal         Diaphragm:              Appears normal  Choroid Plexus:        Previously seen        Stomach:                 Appears normal, left                                                                        sided  Cerebellum:            Previously seen        Abdomen:                Appears normal  Posterior Fossa:       Previously seen        Abdominal Wall:         Previously seen  Nuchal Fold:           Previously seen        Cord Vessels:           Previously seen  Face:                  Orbits and profile     Kidneys:                Appear normal  previously seen  Lips:                  Previously seen        Bladder:                Appears normal  Thoracic:              Appears normal         Spine:                  Appears normal  Heart:                 Appears normal         Upper Extremities:      Previously seen                         (4CH, axis, and                         situs)  RVOT:                  Previously seen        Lower Extremities:      Previously seen  LVOT:                  Previously seen ---------------------------------------------------------------------- Doppler - Fetal Vessels  Umbilical Artery   S/D     %tile      RI    %tile      PI    %tile     PSV    ADFV    RDFV                                                     (cm/s)    3.9       78    0.71       64    1.18       71    46.56      No      No ---------------------------------------------------------------------- Cervix Uterus Adnexa  Right Ovary  Not visualized.  Left Ovary  Not visualized. ---------------------------------------------------------------------- Comments  This patient was seen due to an IUGR fetus.  She denies any  problems since her last exam and reports feeling fetal  movements throughout the day.  Her pregnancy has also  been complicated by pregestational diabetes (type I).  The total AFI was 12.79 cm (within normal limits).  Doppler studies of the umbilical arteries performed due to  fetal growth restriction showed a normal S/D ratio of 3.9.  There were no signs of absent or reversed  end-diastolic flow  noted today.  Due to pregestational diabetes, she was given a referral for a  fetal echocardiogram with [facility] pediatric cardiology.  She  reports that she has not been contacted yet for a fetal  echocardiogram.  She will return in 1 week for another umbilical artery Doppler  study.  We will start weekly NSTs at 28 weeks. ----------------------------------------------------------------------                   Johnell Comings, MD Electronically Signed Final Report   08/08/2022 11:26 am ----------------------------------------------------------------------  [place] MFM UA CORD DOPPLER  Result Date: 08/08/2022 ----------------------------------------------------------------------  OBSTETRICS  REPORT                       (Signed Final 08/08/2022 11:26 am) ---------------------------------------------------------------------- Patient Info  ID #:       [id number]                          D.O.B.:  Aug 26, 1995 (26 yrs)  Name:       [facility]              Visit Date: 08/08/2022 10:41 am ---------------------------------------------------------------------- Performed By  Attending:        Johnell Comings MD         Ref. Address:     [address]                                                             [place], [place]  Performed By:     Milus Glazier,      Location:         [facility]                                     [facility] at                                                             [place] for                                                             Women  Referred By:      Osborne Oman MD ---------------------------------------------------------------------- Orders  #  Description                           Code        Ordered By  1  [place] MFM OB LIMITED                     76815.01    [place]  2  [place] MFM UA CORD DOPPLER                67591.63    BURK SCHAIBLE  ----------------------------------------------------------------------  #  Order #                     Accession #                Episode #  1  [id number]                   [id number]                 [id number]  2  [id number]                   [id number]                 [id number] ---------------------------------------------------------------------- Indications  Maternal care for known or suspected poor      O36.5920  fetal growth, second trimester, not applicable  or unspecified IUGR  Diabetes - Pregestational, 2nd trimester       O24.312  [redacted] weeks gestation of pregnancy                Z3A.25 ---------------------------------------------------------------------- Fetal Evaluation  Num Of Fetuses:         1  Fetal Heart Rate(bpm):  151  Cardiac Activity:       Observed  Presentation:           Breech  Placenta:               Posterior  P. Cord Insertion:      Visualized  Amniotic Fluid  AFI FV:      Within normal limits  AFI Sum(cm)     %Tile       Largest Pocket(cm)  12.79           33          3.99  RUQ(cm)       RLQ(cm)       LUQ(cm)        LLQ(cm)  3.99          2.48          3.31           3.01 ---------------------------------------------------------------------- OB History  Gravidity:    1 ---------------------------------------------------------------------- Gestational Age  LMP:           25w 5d        Date:  02/09/22                  EDD:   11/16/22  Best:          25w 5d     Det. By:  LMP  (02/09/22)          EDD:   11/16/22 ---------------------------------------------------------------------- Anatomy  Cranium:               Appears normal         Aortic Arch:            Previously seen  Cavum:                 Appears normal         Ductal Arch:            Previously seen  Ventricles:            Appears normal         Diaphragm:              Appears normal  Choroid Plexus:        Previously seen  Stomach:                Appears normal, left                                                                         sided  Cerebellum:            Previously seen        Abdomen:                Appears normal  Posterior Fossa:       Previously seen        Abdominal Wall:         Previously seen  Nuchal Fold:           Previously seen        Cord Vessels:           Previously seen  Face:                  Orbits and profile     Kidneys:                Appear normal                         previously seen  Lips:                  Previously seen        Bladder:                Appears normal  Thoracic:              Appears normal         Spine:                  Appears normal  Heart:                 Appears normal         Upper Extremities:      Previously seen                         (4CH, axis, and                         situs)  RVOT:                  Previously seen        Lower Extremities:      Previously seen  LVOT:                  Previously seen ---------------------------------------------------------------------- Doppler - Fetal Vessels  Umbilical Artery   S/D     %tile      RI    %tile      PI    %tile     PSV    ADFV    RDFV                                                     (  cm/s)    3.9       78    0.71       64    1.18       71    46.56      No      No ---------------------------------------------------------------------- Cervix Uterus Adnexa  Right Ovary  Not visualized.  Left Ovary  Not visualized. ---------------------------------------------------------------------- Comments  This patient was seen due to an IUGR fetus.  She denies any  problems since her last exam and reports feeling fetal  movements throughout the day.  Her pregnancy has also  been complicated by pregestational diabetes (type I).  The total AFI was 12.79 cm (within normal limits).  Doppler studies of the umbilical arteries performed due to  fetal growth restriction showed a normal S/D ratio of 3.9.  There were no signs of absent or reversed end-diastolic flow  noted today.  Due to pregestational diabetes, she was given a referral for a   fetal echocardiogram with [facility] pediatric cardiology.  She  reports that she has not been contacted yet for a fetal  echocardiogram.  She will return in 1 week for another umbilical artery Doppler  study.  We will start weekly NSTs at 28 weeks. ----------------------------------------------------------------------                   Johnell Comings, MD Electronically Signed Final Report   08/08/2022 11:26 am ----------------------------------------------------------------------  [place] MFM UA CORD DOPPLER  Result Date: 08/01/2022 ----------------------------------------------------------------------  OBSTETRICS REPORT                       (Signed Final 08/01/2022 02:48 pm) ---------------------------------------------------------------------- Patient Info  ID #:       [id number]                          D.O.B.:  1995-08-03 (26 yrs)  Name:       [facility]              Visit Date: 08/01/2022 01:06 pm ---------------------------------------------------------------------- Performed By  Attending:        Valeda Malm DO       Ref. Address:     [address]                                                             [place], [place]  Performed By:     Nathen May       Location[facility]         [facility]                                     [facility] at  [facility] for                                                             Women  Referred By:      Osborne Oman MD ---------------------------------------------------------------------- Orders  #  Description                           Code        Ordered By  1  [place] MFM OB FOLLOW UP                   76816.01    [place]  2  [place] MFM UA CORD DOPPLER                76820.02    Lynnda Shields  ----------------------------------------------------------------------  #  Order #                     Accession #                Episode #  1  [id number]                   [id number]                 [id number]  2  [id number]                   [id number]                 [id number] ---------------------------------------------------------------------- Indications  Maternal care for known or suspected poor      O36.5920  fetal growth, second trimester, not applicable  or unspecified IUGR  Pre-existing diabetes, type 1, in pregnancy,   O24.012  second trimester (insulin/dextrose)  Medical complication of pregnancy (diabetic    O26.90  ketoacidosis- anion gap 18 06/22/22)  Encounter for other antenatal screening        Z36.2  follow-up  [redacted] weeks gestation of pregnancy                Z3A.24 ---------------------------------------------------------------------- Fetal Evaluation  Num Of Fetuses:         1  Fetal Heart Rate(bpm):  146  Cardiac Activity:       Observed  Presentation:           Cephalic  Placenta:               Anterior  P. Cord Insertion:      Previously visualized  Amniotic Fluid  AFI FV:      Within normal limits                              Largest Pocket(cm)  5.1 ---------------------------------------------------------------------- Biometry  BPD:      59.4  mm     G. Age:  24w 2d         26  %    CI:        74.23   %    70 - 86                                                          FL/HC:      19.1   %    18.7 - 20.3  HC:      218.9  mm     G. Age:  23w 6d          9  %    HC/AC:      1.19        1.04 - 1.22  AC:      184.2  mm     G. Age:  23w 2d          7  %    FL/BPD:     70.4   %    71 - 87  FL:       41.8  mm     G. Age:  23w 4d         10  %    FL/AC:      22.7   %    20 - 24  CER:      26.6  mm     G. Age:  23w 6d         36  %  LV:        4.2  mm  CM:        7.2  mm  Est. FW:     599  gm      1 lb 5 oz      6  %  ---------------------------------------------------------------------- OB History  Gravidity:    1 ---------------------------------------------------------------------- Gestational Age  LMP:           24w 5d        Date:  02/09/22                  EDD:   11/16/22  U/S Today:     23w 5d                                        EDD:   11/23/22  Best:          24w 5d     Det. By:  LMP  (02/09/22)          EDD:   11/16/22 ---------------------------------------------------------------------- Anatomy  Cranium:               Appears normal         LVOT:                   Appears normal  Cavum:                 Appears normal         Aortic Arch:            Appears normal  Ventricles:  Appears normal         Ductal Arch:            Appears normal  Choroid Plexus:        Appears normal         Diaphragm:              Appears normal  Cerebellum:            Appears normal         Stomach:                Appears normal, left                                                                        sided  Posterior Fossa:       Appears normal         Abdomen:                Appears normal  Nuchal Fold:           Previously seen        Abdominal Wall:         Previously seen  Face:                  Orbits and profile     Cord Vessels:           Appears normal (3                         previously seen                                vessel cord)  Lips:                  Appears normal         Kidneys:                Appear normal  Palate:                Appears normal         Bladder:                Appears normal  Thoracic:              Appears normal         Spine:                  Previously seen  Heart:                 Pericardial            Upper Extremities:      Previously seen                         effusion 3.83m  RVOT:                  Appears normal         Lower Extremities:      Previously seen  Other:  Female gender previously seen. Nasal bone, lenses, maxilla,  mandible and falx, VC, 3VV and 3VTV, Feet and  open hands/ Rt5th          digit previously visualized. Left 5th, heels visualized. ---------------------------------------------------------------------- Doppler - Fetal Vessels  Umbilical Artery   S/D     %tile      RI    %tile      PI    %tile     PSV    ADFV    RDFV                                                     (cm/s)    3.2       40     0.7       52     1.1       50     61.6      No      No ---------------------------------------------------------------------- Comments  The patient is here for a follow-up ultrasound for FGR (EFW 6  %, AC 7 %, UD dopplers: normal) at 24w 5d. Other  pregnancy complications: type I DM. She did not schedule  her FE yet but I have given her another referral. She was  started on an insulin pump today due to poor glycemic  control. She knows to go to the hospital if her sugars are in  the 200s consistently. Her fastings have been ok and her  postprandials are improving.  EDD: 11/16/2022.  Dating: LMP  (02/09/22).  Sonographic findings  Single intrauterine pregnancy.  Fetal cardiac activity:  Observed and appears normal.  Presentation: Cephalic.  Interval fetal anatomy appears normal except for a small  pericardial effusion.  Fetal biometry shows the estimated fetal weight at the 6  percentile.  Amniotic fluid volume: Within normal limits. MVP: 5.1 cm.  Placenta: Anterior.  Umbilical artery dopplers findings:  -S/D:3.2 which are normal at this gestational age.  -Absent end-diastolic flow: No.  -Reversed end-diastolic flow:  No.  Recommendations  - Continue weekly UA dopplers. NST starting at 28w. BPPs  will be added around 32 weeks.  - Serial growth [place] every 3 weeks until delivery.  - Delivery likely around 37 weeks or sooner if indicated.  - Fetal echo ordered ----------------------------------------------------------------------                 Valeda Malm, DO Electronically Signed Final Report   08/01/2022 02:48 pm  ----------------------------------------------------------------------  [place] MFM OB FOLLOW UP  Result Date: 08/01/2022 ----------------------------------------------------------------------  OBSTETRICS REPORT                       (Signed Final 08/01/2022 02:48 pm) ---------------------------------------------------------------------- Patient Info  ID #:       [id number]                          D.O.B.:  11/09/1995 (26 yrs)  Name:       [facility]              Visit Date: 08/01/2022 01:06 pm ---------------------------------------------------------------------- Performed By  Attending:        Valeda Malm DO       Ref. Address:     [address]  [place], [place]  Performed By:     Nathen May       [facility]                                     [facility] at                                                             [place] for                                                             Women  Referred By:      Osborne Oman MD ---------------------------------------------------------------------- Orders  #  Description                           Code        Ordered By  1  [place] MFM OB FOLLOW UP                   56213.08    [place]  2  [place] MFM UA CORD DOPPLER                65784.69    Lynnda Shields ----------------------------------------------------------------------  #  Order #                     Accession #                Episode #  1  [id number]                   [id number]                 [id number]  2  [id number]                   [id number]                 [id number] ---------------------------------------------------------------------- Indications  Maternal care for known or suspected poor  Q22.9798  fetal growth, second trimester, not  applicable  or unspecified IUGR  Pre-existing diabetes, type 1, in pregnancy,   O24.012  second trimester (insulin/dextrose)  Medical complication of pregnancy (diabetic    O26.90  ketoacidosis- anion gap 18 06/22/22)  Encounter for other antenatal screening        Z36.2  follow-up  [redacted] weeks gestation of pregnancy                Z3A.24 ---------------------------------------------------------------------- Fetal Evaluation  Num Of Fetuses:         1  Fetal Heart Rate(bpm):  146  Cardiac Activity:       Observed  Presentation:           Cephalic  Placenta:               Anterior  P. Cord Insertion:      Previously visualized  Amniotic Fluid  AFI FV:      Within normal limits                              Largest Pocket(cm)                              5.1 ---------------------------------------------------------------------- Biometry  BPD:      59.4  mm     G. Age:  24w 2d         26  %    CI:        74.23   %    70 - 86                                                          FL/HC:      19.1   %    18.7 - 20.3  HC:      218.9  mm     G. Age:  23w 6d          9  %    HC/AC:      1.19        1.04 - 1.22  AC:      184.2  mm     G. Age:  23w 2d          7  %    FL/BPD:     70.4   %    71 - 87  FL:       41.8  mm     G. Age:  23w 4d         10  %    FL/AC:      22.7   %    20 - 24  CER:      26.6  mm     G. Age:  23w 6d         36  %  LV:        4.2  mm  CM:        7.2  mm  Est. FW:     599  gm      1 lb 5 oz      6  % ---------------------------------------------------------------------- OB History  Gravidity:    1 ---------------------------------------------------------------------- Gestational Age  LMP:  24w 5d        Date:  02/09/22                  EDD:   11/16/22  U/S Today:     23w 5d                                        EDD:   11/23/22  Best:          24w 5d     Det. By:  LMP  (02/09/22)          EDD:   11/16/22 ---------------------------------------------------------------------- Anatomy  Cranium:                Appears normal         LVOT:                   Appears normal  Cavum:                 Appears normal         Aortic Arch:            Appears normal  Ventricles:            Appears normal         Ductal Arch:            Appears normal  Choroid Plexus:        Appears normal         Diaphragm:              Appears normal  Cerebellum:            Appears normal         Stomach:                Appears normal, left                                                                        sided  Posterior Fossa:       Appears normal         Abdomen:                Appears normal  Nuchal Fold:           Previously seen        Abdominal Wall:         Previously seen  Face:                  Orbits and profile     Cord Vessels:           Appears normal (3                         previously seen                                vessel cord)  Lips:                  Appears normal  Kidneys:                Appear normal  Palate:                Appears normal         Bladder:                Appears normal  Thoracic:              Appears normal         Spine:                  Previously seen  Heart:                 Pericardial            Upper Extremities:      Previously seen                         effusion 3.55m  RVOT:                  Appears normal         Lower Extremities:      Previously seen  Other:  Female gender previously seen. Nasal bone, lenses, maxilla,          mandible and falx, VC, 3VV and 3VTV, Feet and open hands/ Rt5th          digit previously visualized. Left 5th, heels visualized. ---------------------------------------------------------------------- Doppler - Fetal Vessels  Umbilical Artery   S/D     %tile      RI    %tile      PI    %tile     PSV    ADFV    RDFV                                                     (cm/s)    3.2       40     0.7       52     1.1       50     61.6      No      No ---------------------------------------------------------------------- Comments  The patient is here for a  follow-up ultrasound for FGR (EFW 6  %, AC 7 %, UD dopplers: normal) at 24w 5d. Other  pregnancy complications: type I DM. She did not schedule  her FE yet but I have given her another referral. She was  started on an insulin pump today due to poor glycemic  control. She knows to go to the hospital if her sugars are in  the 200s consistently. Her fastings have been ok and her  postprandials are improving.  EDD: 11/16/2022.  Dating: LMP  (02/09/22).  Sonographic findings  Single intrauterine pregnancy.  Fetal cardiac activity:  Observed and appears normal.  Presentation: Cephalic.  Interval fetal anatomy appears normal except for a small  pericardial effusion.  Fetal biometry shows the estimated fetal weight at the 6  percentile.  Amniotic fluid volume: Within normal limits. MVP: 5.1 cm.  Placenta: Anterior.  Umbilical artery dopplers findings:  -S/D:3.2 which are normal at this gestational age.  -Absent end-diastolic flow: No.  -Reversed end-diastolic flow:  No.  Recommendations  - Continue  weekly UA dopplers. NST starting at 28w. BPPs  will be added around 32 weeks.  - Serial growth [place] every 3 weeks until delivery.  - Delivery likely around 37 weeks or sooner if indicated.  - Fetal echo ordered ----------------------------------------------------------------------                 Valeda Malm, DO Electronically Signed Final Report   08/01/2022 02:48 pm ----------------------------------------------------------------------   Future Appointments  Date Time Provider [place]  08/21/2022  2:15 PM [facility] NURSE [facility]  08/21/2022  2:30 PM [facility] US3 [facility]  08/22/2022 11:15 AM W[facility]-EDUCATION [facility]  08/22/2022  3:00 PM Brita Romp, NP REA-REA None  08/23/2022  2:30 PM Chancy Milroy, MD [place] None  08/28/2022  2:15 PM [facility] NURSE [facility]  08/28/2022  2:30 PM [facility] US3 [facility]  08/28/2022  3:15 PM [facility]FC NST [facility]  08/31/2022  2:15 PM WMC-EDUCATION [facility]   09/04/2022  2:00 PM [facility] NURSE [facility]  09/04/2022  2:15 PM [facility] NST [facility]  09/06/2022 11:15 AM Chancy Milroy, MD [place] None  09/20/2022 11:15 AM Chancy Milroy, MD [place] None  10/04/2022 11:15 AM [place] Mode, MD [place] None  10/18/2022 11:15 AM Chancy Milroy, MD [place] None    Discharge Condition: Stable  Discharge disposition: 01-Home or Self Care       Discharge Instructions     Activity as tolerated - No restrictions   Complete by: As directed    Call MD for:   Complete by: As directed    Decreased fetal movement, contractions, and vaginal bleeding.   Call MD for:  difficulty breathing, headache or visual disturbances   Complete by: As directed    Call MD for:  persistant nausea and vomiting   Complete by: As directed    Call MD for:  redness, tenderness, or signs of infection (pain, swelling, redness, odor or green/yellow discharge around incision site)   Complete by: As directed    Call MD for:  severe uncontrolled pain   Complete by: As directed    Call MD for:  temperature >100.4   Complete by: As directed    Diet general   Complete by: As directed    May shower / Bathe   Complete by: As directed       Allergies as of 08/15/2022   No Known Allergies      Medication List     STOP taking these medications    Lantus SoloStar 100 UNIT/ML Solostar Pen Generic drug: insulin glargine   polyethylene glycol powder 17 GM/SCOOP powder Commonly known as: GLYCOLAX/MIRALAX       TAKE these medications    Accu-Chek Guide test strip Generic drug: glucose blood Use as instructed to monitor glucose 8 times daily (before meals, after meals) due to gestational diabetes.   accu-chek soft touch lancets Use as directed.   Accu-Chek FastClix Lancets Misc Check sugar 10 x daily   Alcohol Pads 70 % Pads Use to wipe skin prior to insulin injections twice daily   B-D UF III MINI PEN NEEDLES 31G X 5 MM Misc Generic drug: Insulin Pen  Needle USE TO CHECK BLOOD SUGAR IN THE MORNING BEFORE EATING, BEFORE EACH MEAL, AND AS NEEDED   Blood Pressure Kit Devi 1 kit by Does not apply route once a week.   cyclobenzaprine 10 MG tablet Commonly known as: FLEXERIL Take 1 tablet (10 mg total) by mouth 3 (three)  times daily as needed for muscle spasms.   Dexcom G6 Receiver Devi USE AS DIRECTED   Dexcom G6 Sensor Misc Inject 1 applicator into the skin as directed. (change sensor every 10 days)   Dexcom G6 Transmitter Misc INJECT 1 DEVICE UNDER THE SKIN AS DIRECTED UP TO 8 TIMES WITH EACH NEW SENSOR   Ensure High Protein Liqd Take 1 each by mouth daily at 6 (six) AM.   feeding supplement Liqd Take 237 mLs by mouth 2 (two) times daily between meals.   Gojji Weight Scale Misc 1 Device by Does not apply route every 30 (thirty) days.   hydrOXYzine 50 MG tablet Commonly known as: ATARAX Take 1 tablet (50 mg total) by mouth every 6 (six) hours as needed for nausea or vomiting.   insulin lispro 100 UNIT/ML injection Commonly known as: HumaLOG Inject 0.08 mLs (8 Units total) into the skin once for 1 dose. Use with Omnipod for TDD around 40 units daily   insulin pump Soln Inject into the skin every 4 (four) hours.   metoCLOPramide 10 MG tablet Commonly known as: REGLAN Take 1 tablet (10 mg total) by mouth every 6 (six) hours. What changed:  when to take this additional instructions   Omnipod 5 G6 Pod (Gen 5) Misc 1 Units by Does not apply route every 3 (three) days.   ondansetron 4 MG disintegrating tablet Commonly known as: ZOFRAN-ODT Take 1-2 tablets (4-8 mg total) by mouth every 8 (eight) hours as needed for nausea or vomiting.   OneTouch Verio Flex System w/Device Kit Use to monitor glucose 8 times daily before and after meals.   pantoprazole 40 MG tablet Commonly known as: PROTONIX Take 1 tablet (40 mg total) by mouth daily.   Prenatal Vitamin 27-0.8 MG Tabs Take 1 tablet by mouth daily.   promethazine  25 MG tablet Commonly known as: PHENERGAN Take 1 tablet (25 mg total) by mouth every 6 (six) hours as needed for nausea or vomiting. What changed:  medication strength how much to take when to take this   scopolamine 1 MG/3DAYS Commonly known as: TRANSDERM-SCOP Place 1 patch (1.5 mg total) onto the skin every 3 (three) days. Start taking on: August 16, 2022   sertraline 50 MG tablet Commonly known as: Zoloft Take 1 tablet (50 mg total) by mouth daily.        Follow-up [place] [facility] Follow up in 1 week(s).   Specialty: Obstetrics and Gynecology Contact information: [address], [place] [phone]                Total discharge time: 30 minutes   Signed: Radene Gunning M.D. 08/15/2022, 2:05 PM

## 2022-08-15 NOTE — Progress Notes (Signed)
Hypoglycemic Event  CBG: 43  Treatment: 8 oz cola, 1 pack graham crackers  Symptoms: Shaky  Follow-up CBG: [id number] CBG Result:39  MD notified, no new orders received. Ok to continue 2nd step in hypoglycemic protocol   Treatment: 8 oz orange juice, 1 cheese stick  Follow-up CBG: Time: 0446   CBG Result:63  Patient no longer symptomatic. MD notified, ordered to repeat current treatment until CBG >70  Treatment: 8oz orange juice, 1 pack graham crackers  Follow-up CBG: Time: 0502   CBG Result: 78  Possible Reasons for Event: Inadequate meal intake    Benedict Needy, RN

## 2022-08-15 NOTE — Plan of Care (Signed)
  Problem: Education: Goal: Ability to describe self-care measures that may prevent or decrease complications (Diabetes Survival Skills Education) will improve Outcome: Adequate for Discharge Goal: Individualized Educational Video(s) Outcome: Adequate for Discharge   Problem: Coping: Goal: Ability to adjust to condition or change in health will improve Outcome: Adequate for Discharge   Problem: Fluid Volume: Goal: Ability to maintain a balanced intake and output will improve Outcome: Adequate for Discharge   Problem: Health Behavior/Discharge Planning: Goal: Ability to identify and utilize available resources and services will improve Outcome: Adequate for Discharge Goal: Ability to manage health-related needs will improve Outcome: Adequate for Discharge   Problem: Metabolic: Goal: Ability to maintain appropriate glucose levels will improve Outcome: Adequate for Discharge   Problem: Nutritional: Goal: Maintenance of adequate nutrition will improve Outcome: Adequate for Discharge Goal: Progress toward achieving an optimal weight will improve Outcome: Adequate for Discharge   Problem: Skin Integrity: Goal: Risk for impaired skin integrity will decrease Outcome: Adequate for Discharge   Problem: Tissue Perfusion: Goal: Adequacy of tissue perfusion will improve Outcome: Adequate for Discharge   Problem: Education: Goal: Knowledge of disease or condition will improve Outcome: Adequate for Discharge Goal: Knowledge of the prescribed therapeutic regimen will improve Outcome: Adequate for Discharge Goal: Individualized Educational Video(s) Outcome: Adequate for Discharge   Problem: Clinical Measurements: Goal: Complications related to the disease process, condition or treatment will be avoided or minimized Outcome: Adequate for Discharge   Problem: Education: Goal: Knowledge of disease or condition will improve Outcome: Adequate for Discharge Goal: Knowledge of the  prescribed therapeutic regimen will improve Outcome: Adequate for Discharge   Problem: Bowel/Gastric: Goal: Occurences of nausea and/or vomiting will decrease Outcome: Adequate for Discharge   Problem: Fluid Volume: Goal: Maintenance of adequate hydration will improve Outcome: Adequate for Discharge   Problem: Nutritional: Goal: Achievement of adequate weight for body size and type will improve Outcome: Adequate for Discharge   Problem: Education: Goal: Knowledge of General Education information will improve Description: Including pain rating scale, medication(s)/side effects and non-pharmacologic comfort measures Outcome: Adequate for Discharge   Problem: Health Behavior/Discharge Planning: Goal: Ability to manage health-related needs will improve Outcome: Adequate for Discharge   Problem: Clinical Measurements: Goal: Ability to maintain clinical measurements within normal limits will improve Outcome: Adequate for Discharge Goal: Will remain free from infection Outcome: Adequate for Discharge Goal: Diagnostic test results will improve Outcome: Adequate for Discharge Goal: Respiratory complications will improve Outcome: Adequate for Discharge Goal: Cardiovascular complication will be avoided Outcome: Adequate for Discharge   Problem: Activity: Goal: Risk for activity intolerance will decrease Outcome: Adequate for Discharge   Problem: Nutrition: Goal: Adequate nutrition will be maintained Outcome: Adequate for Discharge   Problem: Coping: Goal: Level of anxiety will decrease Outcome: Adequate for Discharge   Problem: Elimination: Goal: Will not experience complications related to bowel motility Outcome: Adequate for Discharge Goal: Will not experience complications related to urinary retention Outcome: Adequate for Discharge   Problem: Pain Managment: Goal: General experience of comfort will improve Outcome: Adequate for Discharge   Problem: Safety: Goal:  Ability to remain free from injury will improve Outcome: Adequate for Discharge   Problem: Skin Integrity: Goal: Risk for impaired skin integrity will decrease Outcome: Adequate for Discharge

## 2022-08-15 NOTE — Progress Notes (Signed)
Inpatient Diabetes Program Recommendations  ADA Standards of Care 2023 Diabetes in Pregnancy Target Glucose Ranges:  Fasting: 70 - 95 mg/dL 1 hr postprandial:  110 - '140mg'$ /dL (from first bite of meal) 2 hr postprandial:  100 - 120 mg/dL (from first bit of meal)    Lab Results  Component Value Date   GLUCAP 136 (H) 08/15/2022   HGBA1C 6.4 (H) 08/14/2022    Review of Glycemic Control  Latest Reference Range & Units 08/15/22 00:06 08/15/22 04:12 08/15/22 04:31 08/15/22 04:46 08/15/22 05:02  Glucose-Capillary 70 - 99 mg/dL 77 43 (LL) 39 (LL) 63 (L) 78  Diabetes history: DM 1 Outpatient Diabetes medications:  Omnipod-  Basal settings Max basal 3 Basal rates 0.7 Temp basal   Bolus settings Target Blood glucose  110 Insulin to carb (IC) ratio  13 Correction factor 50 mg/dL/unit for BG above 110 mg/dL Minimum blood glucose for bolus calculations 70 mg/dL Current orders for Inpatient glycemic control:  Insulin pump restarted yesterday afternoon with basal rate of 0.7 units/hr   Inpatient Diabetes Program Recommendations:    Patient continues to have lows on insulin pump.  It appears that this was happening outpatient as well.  Of note the Omnipod is not in "automode" and therefore when blood sugar drops, the pump is not reducing the basal rate.  Discussed with Dr. Damita Dunnings and noted that patient may need to be switched back to SQ regimen if blood sugars continue to drop with insulin pump?  Patients insulin needs have dropped substantially in pregnancy.  Orders received to reduce basal rates to 0.4 units/hr for 24 hours (reduction from 16.8 units in 24 hours to 9.6 units in 24 hours). Reduction done with Jacob Moores, RN, CDE for verification of changes. Reviewed with patient the importance of monitoring blood sugars closely and also instructed patient to increase low alarm to 80 mg/dL so she can get notification earlier. Also recommended f/u with endocrinologist ASAP.  She states she has  appt. With Rayetta Pigg, NP in [place] on 08/22/22.  Discussed treatment of hypoglycemia and patient states she has both glucose tablets and Baqsimi intranasal for treatment of low blood sugars.  She knows to report hypoglycemia to MD as well.   Thanks,  Adah Perl, RN, BC-ADM Inpatient Diabetes Coordinator Pager [phone]  (8a-5p)

## 2022-08-16 DIAGNOSIS — O24019 Pre-existing diabetes mellitus, type 1, in pregnancy, unspecified trimester: Secondary | ICD-10-CM | POA: Diagnosis not present

## 2022-08-18 LAB — PANORAMA PRENATAL TEST FULL PANEL:PANORAMA TEST PLUS 5 ADDITIONAL MICRODELETIONS: FETAL FRACTION: 17.5

## 2022-08-20 ENCOUNTER — Inpatient Hospital Stay (HOSPITAL_COMMUNITY)
Admission: AD | Admit: 2022-08-20 | Discharge: 2022-08-20 | Disposition: A | Discharge status: Home / Self Care | Payer: Medicaid Other | Attending: Obstetrics & Gynecology | Admitting: Obstetrics & Gynecology

## 2022-08-20 ENCOUNTER — Encounter (HOSPITAL_COMMUNITY): Payer: Self-pay | Admitting: Obstetrics & Gynecology

## 2022-08-20 DIAGNOSIS — O210 Mild hyperemesis gravidarum: Secondary | ICD-10-CM | POA: Diagnosis not present

## 2022-08-20 DIAGNOSIS — R112 Nausea with vomiting, unspecified: Secondary | ICD-10-CM | POA: Diagnosis not present

## 2022-08-20 DIAGNOSIS — R110 Nausea: Secondary | ICD-10-CM | POA: Diagnosis not present

## 2022-08-20 DIAGNOSIS — Z3A27 27 weeks gestation of pregnancy: Secondary | ICD-10-CM | POA: Insufficient documentation

## 2022-08-20 DIAGNOSIS — E8729 Other acidosis: Secondary | ICD-10-CM | POA: Diagnosis not present

## 2022-08-20 DIAGNOSIS — R1111 Vomiting without nausea: Secondary | ICD-10-CM | POA: Diagnosis not present

## 2022-08-20 DIAGNOSIS — O211 Hyperemesis gravidarum with metabolic disturbance: Secondary | ICD-10-CM | POA: Insufficient documentation

## 2022-08-20 DIAGNOSIS — R000 Tachycardia, unspecified: Secondary | ICD-10-CM | POA: Diagnosis not present

## 2022-08-20 LAB — URINALYSIS, ROUTINE W REFLEX MICROSCOPIC
Bilirubin Urine: NEGATIVE
Glucose, UA: NEGATIVE mg/dL
Hgb urine dipstick: NEGATIVE
Ketones, ur: 80 mg/dL — AB
Leukocytes,Ua: NEGATIVE
Nitrite: NEGATIVE
Protein, ur: 30 mg/dL — AB
Specific Gravity, Urine: 1.021 (ref 1.005–1.030)
pH: 6 (ref 5.0–8.0)

## 2022-08-20 LAB — COMPREHENSIVE METABOLIC PANEL
ALT: 14 U/L (ref 0–44)
AST: 19 U/L (ref 15–41)
Albumin: 3.6 g/dL (ref 3.5–5.0)
Alkaline Phosphatase: 73 U/L (ref 38–126)
Anion gap: 18 — ABNORMAL HIGH (ref 5–15)
BUN: 8 mg/dL (ref 6–20)
CO2: 18 mmol/L — ABNORMAL LOW (ref 22–32)
Calcium: 9.7 mg/dL (ref 8.9–10.3)
Chloride: 94 mmol/L — ABNORMAL LOW (ref 98–111)
Creatinine, Ser: 0.76 mg/dL (ref 0.44–1.00)
GFR, Estimated: >60 mL/min (ref >60)
Glucose, Bld: 160 mg/dL — ABNORMAL HIGH (ref 70–99)
Potassium: 3 mmol/L — ABNORMAL LOW (ref 3.5–5.1)
Sodium: 130 mmol/L — ABNORMAL LOW (ref 135–145)
Total Bilirubin: 0.6 mg/dL (ref 0.3–1.2)
Total Protein: 7.6 g/dL (ref 6.5–8.1)

## 2022-08-20 LAB — BASIC METABOLIC PANEL
Anion gap: 11 (ref 5–15)
BUN: 7 mg/dL (ref 6–20)
CO2: 16 mmol/L — ABNORMAL LOW (ref 22–32)
Calcium: 8.1 mg/dL — ABNORMAL LOW (ref 8.9–10.3)
Chloride: 104 mmol/L (ref 98–111)
Creatinine, Ser: 0.63 mg/dL (ref 0.44–1.00)
GFR, Estimated: >60 mL/min (ref >60)
Glucose, Bld: 108 mg/dL — ABNORMAL HIGH (ref 70–99)
Potassium: 3.5 mmol/L (ref 3.5–5.1)
Sodium: 131 mmol/L — ABNORMAL LOW (ref 135–145)

## 2022-08-20 LAB — HORIZON CUSTOM: REPORT SUMMARY: NEGATIVE

## 2022-08-20 LAB — AMYLASE: Amylase: 98 U/L (ref 28–100)

## 2022-08-20 LAB — LIPASE, BLOOD: Lipase: 24 U/L (ref 11–51)

## 2022-08-20 LAB — CBC
HCT: 39 % (ref 36.0–46.0)
Hemoglobin: 13.1 g/dL (ref 12.0–15.0)
MCH: 28.9 pg (ref 26.0–34.0)
MCHC: 33.6 g/dL (ref 30.0–36.0)
MCV: 85.9 fL (ref 80.0–100.0)
Platelets: 412 10*3/uL — ABNORMAL HIGH (ref 150–400)
RBC: 4.54 MIL/uL (ref 3.87–5.11)
RDW: 13.2 % (ref 11.5–15.5)
WBC: 11 10*3/uL — ABNORMAL HIGH (ref 4.0–10.5)
nRBC: 0 % (ref 0.0–0.2)

## 2022-08-20 LAB — BETA-HYDROXYBUTYRIC ACID: Beta-Hydroxybutyric Acid: 4.01 mmol/L — ABNORMAL HIGH (ref 0.05–0.27)

## 2022-08-20 LAB — TYPE AND SCREEN
ABO/RH(D): O POS
Antibody Screen: NEGATIVE

## 2022-08-20 MED ORDER — LACTATED RINGERS IV BOLUS: 1000.0000 mL | Freq: Once | INTRAVENOUS | Status: DC

## 2022-08-20 MED ORDER — SODIUM CHLORIDE 0.9 % IV SOLN: INTRAVENOUS | Status: DC

## 2022-08-20 MED ORDER — SODIUM CHLORIDE 0.9 % IV SOLN: Freq: Once | INTRAVENOUS | Status: AC

## 2022-08-20 MED ORDER — ONDANSETRON HCL 4 MG/2ML IJ SOLN
4.0000 mg | Freq: Once | INTRAMUSCULAR | Status: AC
  Administered 2022-08-20: 4 mg via INTRAVENOUS
  Filled 2022-08-20: qty 2

## 2022-08-20 MED ORDER — LACTATED RINGERS IV BOLUS
1000.0000 mL | Freq: Once | INTRAVENOUS | Status: AC
  Administered 2022-08-20: 1000 mL via INTRAVENOUS

## 2022-08-20 MED ORDER — PROCHLORPERAZINE EDISYLATE 10 MG/2ML IJ SOLN
10.0000 mg | Freq: Once | INTRAMUSCULAR | Status: AC
  Administered 2022-08-20: 10 mg via INTRAVENOUS
  Filled 2022-08-20: qty 2
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
## 2022-08-20 NOTE — MAU Note (Addendum)
.  Sabrina Sutton is a 27 y.o. at 71w3dhere in MAU reporting: nausea and vomiting. Pt arrived via EMS and was ambulatory in mau. Pt has ongoing nausea and vomiting but it worsened yesterday. Has vomited one time today. Took reglan at 9 am. Reports positive fetal movement.  Onset of complaint: yesterday Pain score: 0/10 Vitals:   08/20/22 1204  BP: 119/79  Pulse: (!) 137  Resp: 16  Temp: 98 F (36.7 C)  SpO2: 99%     FHT:137 Lab orders placed from triage:

## 2022-08-20 NOTE — Discharge Instructions (Signed)

## 2022-08-20 NOTE — MAU Note (Signed)
Iv team at bedside  

## 2022-08-20 NOTE — MAU Note (Signed)
Pt reports nausea is better, no vomiting since arrival. Also reports the pressure in her lower abd is gone now

## 2022-08-20 NOTE — MAU Note (Signed)
CBG 147 per dexcom

## 2022-08-21 ENCOUNTER — Ambulatory Visit: Payer: Medicaid Other | Attending: Maternal & Fetal Medicine

## 2022-08-21 ENCOUNTER — Ambulatory Visit: Payer: Medicaid Other | Admitting: *Deleted

## 2022-08-21 ENCOUNTER — Encounter: Payer: Self-pay | Admitting: *Deleted

## 2022-08-21 ENCOUNTER — Other Ambulatory Visit: Payer: Self-pay | Admitting: *Deleted

## 2022-08-21 VITALS — BP 115/71 | HR 105

## 2022-08-21 DIAGNOSIS — Z3689 Encounter for other specified antenatal screening: Secondary | ICD-10-CM

## 2022-08-21 DIAGNOSIS — O24013 Pre-existing diabetes mellitus, type 1, in pregnancy, third trimester: Secondary | ICD-10-CM

## 2022-08-21 DIAGNOSIS — O0990 Supervision of high risk pregnancy, unspecified, unspecified trimester: Secondary | ICD-10-CM | POA: Diagnosis present

## 2022-08-21 DIAGNOSIS — O24019 Pre-existing diabetes mellitus, type 1, in pregnancy, unspecified trimester: Secondary | ICD-10-CM | POA: Diagnosis present

## 2022-08-21 DIAGNOSIS — O24012 Pre-existing diabetes mellitus, type 1, in pregnancy, second trimester: Secondary | ICD-10-CM | POA: Diagnosis not present

## 2022-08-21 DIAGNOSIS — F419 Anxiety disorder, unspecified: Secondary | ICD-10-CM | POA: Diagnosis present

## 2022-08-21 DIAGNOSIS — O365920 Maternal care for other known or suspected poor fetal growth, second trimester, not applicable or unspecified: Secondary | ICD-10-CM | POA: Diagnosis not present

## 2022-08-21 DIAGNOSIS — Z3A27 27 weeks gestation of pregnancy: Secondary | ICD-10-CM

## 2022-08-21 DIAGNOSIS — O365930 Maternal care for other known or suspected poor fetal growth, third trimester, not applicable or unspecified: Secondary | ICD-10-CM

## 2022-08-21 DIAGNOSIS — O210 Mild hyperemesis gravidarum: Secondary | ICD-10-CM | POA: Diagnosis not present

## 2022-08-21 DIAGNOSIS — E8729 Other acidosis: Secondary | ICD-10-CM | POA: Diagnosis present

## 2022-08-21 DIAGNOSIS — O365990 Maternal care for other known or suspected poor fetal growth, unspecified trimester, not applicable or unspecified: Secondary | ICD-10-CM | POA: Diagnosis present

## 2022-08-21 DIAGNOSIS — E109 Type 1 diabetes mellitus without complications: Secondary | ICD-10-CM

## 2022-08-21 DIAGNOSIS — Z362 Encounter for other antenatal screening follow-up: Secondary | ICD-10-CM | POA: Diagnosis not present

## 2022-08-22 ENCOUNTER — Encounter: Payer: Self-pay | Admitting: Nurse Practitioner

## 2022-08-22 ENCOUNTER — Ambulatory Visit (INDEPENDENT_AMBULATORY_CARE_PROVIDER_SITE_OTHER): Payer: Medicaid Other | Admitting: Nurse Practitioner

## 2022-08-22 ENCOUNTER — Ambulatory Visit (INDEPENDENT_AMBULATORY_CARE_PROVIDER_SITE_OTHER): Payer: Medicaid Other | Admitting: Registered"

## 2022-08-22 ENCOUNTER — Other Ambulatory Visit: Payer: Self-pay

## 2022-08-22 ENCOUNTER — Telehealth: Payer: Self-pay | Admitting: *Deleted

## 2022-08-22 ENCOUNTER — Encounter: Payer: Medicaid Other | Attending: Obstetrics & Gynecology | Admitting: Registered"

## 2022-08-22 ENCOUNTER — Encounter: Payer: Self-pay | Admitting: Obstetrics

## 2022-08-22 VITALS — BP 95/61 | HR 94 | Ht 67.0 in | Wt 121.4 lb

## 2022-08-22 DIAGNOSIS — Z794 Long term (current) use of insulin: Secondary | ICD-10-CM | POA: Diagnosis not present

## 2022-08-22 DIAGNOSIS — E108 Type 1 diabetes mellitus with unspecified complications: Secondary | ICD-10-CM | POA: Insufficient documentation

## 2022-08-22 DIAGNOSIS — E109 Type 1 diabetes mellitus without complications: Secondary | ICD-10-CM | POA: Diagnosis not present

## 2022-08-22 DIAGNOSIS — Z713 Dietary counseling and surveillance: Secondary | ICD-10-CM | POA: Insufficient documentation

## 2022-08-22 MED ORDER — INSULIN LISPRO (1 UNIT DIAL) 100 UNIT/ML (KWIKPEN): PEN_INJECTOR | SUBCUTANEOUS | 1 refills | Status: DC
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
## 2022-08-22 NOTE — Progress Notes (Signed)
Patient was seen on 08/22/22 for follow-up assessment after Omnipod start   Start 1125 End 1205  EDD 11/16/22; [redacted]w[redacted]d  Pt states she would like to continue to learn about Omnipod features to get the best use out of the technology. Pt states she is waiting to get a new phone that is compatible with Dexcom g6.  Pt reports that the Dexcom G6 has been inaccurate both for low and high blood sugar readings.   From [place] reports appears pt is no longer having hypoglycemic events. Pt states she has instructions from last visit for extended bolus feature.    Discussed with patient the bolus and correction calculator goals to return blood sugar to 110 mg/dL but when in manual mode can get blood sugar to go lower.  Reviewed [place] graph and discussed possibility of why meal bolus yesterday was not adequate. It appears patient usually is entering in correct grams of carbs with BG returning to pre-meal levels.   Pt states she does not get predictable alerts and sometimes is not aware she is going above 200 until she checks reader when getting ready to eat.  2-week graph    The following learning objectives reviewed during follow-up visit: Uploading data to [employer] sharing with healthcare providers Alarm settings - needs to review with WRayetta Pigg NP  Calibrating Dexcom reader Increased insulin needs during the night related to pregnancy Impact of correct carb entries  Plan:  [email or web address] [email or web address] Enter carbs into your Omnipod controller whenever you eat Calibrate your reader when your blood sugar is steady (from the website.."you will be prompted to calibrate 12 hours later and once more at the 24-hour mark)(another 12 hours later)"

## 2022-08-22 NOTE — Telephone Encounter (Signed)
Has appointment today - asking if she may come in [place] as she has another appointment close to the schelduled time.   Patient would like a call back at [phone].

## 2022-08-23 ENCOUNTER — Encounter: Payer: Self-pay | Admitting: Obstetrics and Gynecology

## 2022-08-23 ENCOUNTER — Ambulatory Visit (INDEPENDENT_AMBULATORY_CARE_PROVIDER_SITE_OTHER): Payer: Medicaid Other | Admitting: Obstetrics and Gynecology

## 2022-08-23 VITALS — BP 110/70 | HR 93 | Wt 122.0 lb

## 2022-08-23 DIAGNOSIS — E108 Type 1 diabetes mellitus with unspecified complications: Secondary | ICD-10-CM

## 2022-08-23 DIAGNOSIS — O0992 Supervision of high risk pregnancy, unspecified, second trimester: Secondary | ICD-10-CM

## 2022-08-23 DIAGNOSIS — Z3A27 27 weeks gestation of pregnancy: Secondary | ICD-10-CM

## 2022-08-23 DIAGNOSIS — F419 Anxiety disorder, unspecified: Secondary | ICD-10-CM

## 2022-08-23 DIAGNOSIS — O365921 Maternal care for other known or suspected poor fetal growth, second trimester, fetus 1: Secondary | ICD-10-CM

## 2022-08-23 DIAGNOSIS — O0990 Supervision of high risk pregnancy, unspecified, unspecified trimester: Secondary | ICD-10-CM

## 2022-08-23 NOTE — Patient Instructions (Signed)
Third Trimester of Pregnancy  The third trimester of pregnancy is from week 28 through week 70. This is months 7 through 9. The third trimester is a time when the unborn baby (fetus) is growing rapidly. At the end of the ninth month, the fetus is about 20 inches long and weighs 6-10 pounds. Body changes during your third trimester During the third trimester, your body will continue to go through many changes. The changes vary and generally return to normal after your baby is born. Physical changes Your weight will continue to increase. You can expect to gain 25-35 pounds (11-16 kg) by the end of the pregnancy if you begin pregnancy at a normal weight. If you are underweight, you can expect to gain 28-40 lb (about 13-18 kg), and if you are overweight, you can expect to gain 15-25 lb (about 7-11 kg). You may begin to get stretch marks on your hips, abdomen, and breasts. Your breasts will continue to grow and may hurt. A yellow fluid (colostrum) may leak from your breasts. This is the first milk you are producing for your baby. You may have changes in your hair. These can include thickening of your hair, rapid growth, and changes in texture. Some people also have hair loss during or after pregnancy, or hair that feels dry or thin. Your belly button may stick out. You may notice more swelling in your hands, face, or ankles. Health changes You may have heartburn. You may have constipation. You may develop hemorrhoids. You may develop swollen, bulging veins (varicose veins) in your legs. You may have increased body aches in the pelvis, back, or thighs. This is due to weight gain and increased hormones that are relaxing your joints. You may have increased tingling or numbness in your hands, arms, and legs. The skin on your abdomen may also feel numb. You may feel short of breath because of your expanding uterus. Other changes You may urinate more often because the fetus is moving lower into your pelvis  and pressing on your bladder. You may have more problems sleeping. This may be caused by the size of your abdomen, an increased need to urinate, and an increase in your body's metabolism. You may notice the fetus "dropping," or moving lower in your abdomen (lightening). You may have increased vaginal discharge. You may notice that you have pain around your pelvic bone as your uterus distends. Follow these instructions at home: Medicines Follow your health care provider's instructions regarding medicine use. Specific medicines may be either safe or unsafe to take during pregnancy. Do not take any medicines unless approved by your health care provider. Take a prenatal vitamin that contains at least 600 micrograms (mcg) of folic acid. Eating and drinking Eat a healthy diet that includes fresh fruits and vegetables, whole grains, good sources of protein such as meat, eggs, or tofu, and low-fat dairy products. Avoid raw meat and unpasteurized juice, milk, and cheese. These carry germs that can harm you and your baby. Eat 4 or 5 small meals rather than 3 large meals a day. You may need to take these actions to prevent or treat constipation: Drink enough fluid to keep your urine pale yellow. Eat foods that are high in fiber, such as beans, whole grains, and fresh fruits and vegetables. Limit foods that are high in fat and processed sugars, such as fried or sweet foods. Activity Exercise only as directed by your health care provider. Most people can continue their usual exercise routine during pregnancy. Try  to exercise for 30 minutes at least 5 days a week. Stop exercising if you experience contractions in the uterus. Stop exercising if you develop pain or cramping in the lower abdomen or lower back. Avoid heavy lifting. Do not exercise if it is very hot or humid or if you are at a high altitude. If you choose to, you may continue to have sex unless your health care provider tells you not  to. Relieving pain and discomfort Take frequent breaks and rest with your legs raised (elevated) if you have leg cramps or low back pain. Take warm sitz baths to soothe any pain or discomfort caused by hemorrhoids. Use hemorrhoid cream if your health care provider approves. Wear a supportive bra to prevent discomfort from breast tenderness. If you develop varicose veins: Wear support hose as told by your health care provider. Elevate your feet for 15 minutes, 3-4 times a day. Limit salt in your diet. Safety Talk to your health care provider before traveling far distances. Do not use hot tubs, steam rooms, or saunas. Wear your seat belt at all times when driving or riding in a car. Talk with your health care provider if someone is verbally or physically abusive to you. Preparing for birth To prepare for the arrival of your baby: Take prenatal classes to understand, practice, and ask questions about labor and delivery. Visit the hospital and tour the maternity area. Purchase a rear-facing car seat and make sure you know how to install it in your car. Prepare the baby's room or sleeping area. Make sure to remove all pillows and stuffed animals from the baby's crib to prevent suffocation. General instructions Avoid cat litter boxes and soil used by cats. These carry germs that can cause birth defects in the baby. If you have a cat, ask someone to clean the litter box for you. Do not douche or use tampons. Do not use scented sanitary pads. Do not use any products that contain nicotine or tobacco, such as cigarettes, e-cigarettes, and chewing tobacco. If you need help quitting, ask your health care provider. Do not use any herbal remedies, illegal drugs, or medicines that were not prescribed to you. Chemicals in these products can harm your baby. Do not drink alcohol. You will have more frequent prenatal exams during the third trimester. During a routine prenatal visit, your health care provider  will do a physical exam, perform tests, and discuss your overall health. Keep all follow-up visits. This is important. Where to find more information American Pregnancy Association: americanpregnancy.[place] and Gynecologists: PoolDevices.com.pt Office on [employer]: KeywordPortfolios.com.br Contact a health care provider if you have: A fever. Mild pelvic cramps, pelvic pressure, or nagging pain in your abdominal area or lower back. Vomiting or diarrhea. Bad-smelling vaginal discharge or foul-smelling urine. Pain when you urinate. A headache that does not go away when you take medicine. Visual changes or see spots in front of your eyes. Get help right away if: Your water breaks. You have regular contractions less than 5 minutes apart. You have spotting or bleeding from your vagina. You have severe abdominal pain. You have difficulty breathing. You have chest pain. You have fainting spells. You have not felt your baby move for the time period told by your health care provider. You have new or increased pain, swelling, or redness in an arm or leg. Summary The third trimester of pregnancy is from week 28 through week 40 (months 7 through 9). You may have more problems  sleeping. This can be caused by the size of your abdomen, an increased need to urinate, and an increase in your body's metabolism. You will have more frequent prenatal exams during the third trimester. Keep all follow-up visits. This is important. This information is not intended to replace advice given to you by your health care provider. Make sure you discuss any questions you have with your health care provider. Document Revised: 12/17/2019 Document Reviewed: 10/23/2019 Elsevier Patient Education  [place].

## 2022-08-23 NOTE — Progress Notes (Signed)
Subjective:  Sabrina Sutton is a 27 y.o. G1P0 at 54w6dbeing seen today for ongoing prenatal care.  She is currently monitored for the following issues for this high-risk pregnancy and has Type 1 diabetes mellitus with complications ([place]; HSV-2 infection; Anxiety; Supervision of high risk pregnancy, antepartum; Hyperemesis gravidarum; Maternal care for poor fetal growth in second trimester; and IUGR (intrauterine growth restriction) affecting care of mother on their problem list.  Patient reports  general discomforts of pregnancy. Sx from most recent MAU visits have resolved.Was seen by Diabetic educator yesterday .  Contractions: Not present. Vag. Bleeding: None.  Movement: Present. Denies leaking of fluid.   The following portions of the patient's history were reviewed and updated as appropriate: allergies, current medications, past family history, past medical history, past social history, past surgical history and problem list. Problem list updated.  Objective:   Vitals:   08/23/22 1441  BP: 110/70  Pulse: 93  Weight: 122 lb (55.3 kg)    Fetal Status: Fetal Heart Rate (bpm): 152   Movement: Present     General:  Alert, oriented and cooperative. Patient is in no acute distress.  Skin: Skin is warm and dry. No rash noted.   Cardiovascular: Normal heart rate noted  Respiratory: Normal respiratory effort, no problems with respiration noted  Abdomen: Soft, gravid, appropriate for gestational age. Pain/Pressure: Absent     Pelvic:  Cervical exam deferred        Extremities: Normal range of motion.  Edema: None  Mental Status: Normal mood and affect. Normal behavior. Normal judgment and thought content.   Urinalysis:      Assessment and Plan:  Pregnancy: G1P0 at 264w6d1. Supervision of high risk pregnancy, antepartum Stable 28 week  labs miss Glucola today  2. Type 1 diabetes mellitus with complications (HCC) Stable Will check Beta hydrox today Continue with Dexacom,  followed by diabetic educator Serial growth scans and antenatal testing as per MFM Growth 1/18, 16% Nl fetal ECHO  3. Poor fetal growth affecting management of mother in second trimester, fetus 1 of multiple gestation Serial growth scans and antenatal testing as per MFM  4. Anxiety Stable  Continue with Zoloft  Preterm labor symptoms and general obstetric precautions including but not limited to vaginal bleeding, contractions, leaking of fluid and fetal movement were reviewed in detail with the patient. Please refer to After Visit Summary for other counseling recommendations.  Return in about 2 weeks (around 09/06/2022) for OB visit, face to face, MD only.   ErChancy MilroyMD

## 2022-08-24 ENCOUNTER — Other Ambulatory Visit: Payer: Medicaid Other

## 2022-08-24 DIAGNOSIS — E108 Type 1 diabetes mellitus with unspecified complications: Secondary | ICD-10-CM | POA: Diagnosis not present

## 2022-08-24 DIAGNOSIS — O0990 Supervision of high risk pregnancy, unspecified, unspecified trimester: Secondary | ICD-10-CM | POA: Diagnosis not present

## 2022-08-28 ENCOUNTER — Ambulatory Visit: Payer: Medicaid Other

## 2022-08-31 ENCOUNTER — Other Ambulatory Visit: Payer: Self-pay

## 2022-08-31 ENCOUNTER — Other Ambulatory Visit: Payer: Medicaid Other

## 2022-08-31 ENCOUNTER — Observation Stay (HOSPITAL_COMMUNITY)
Admission: AD | Admit: 2022-08-31 | Discharge: 2022-09-01 | Disposition: A | Discharge status: Home / Self Care | Payer: Medicaid Other | Attending: Obstetrics and Gynecology | Admitting: Obstetrics and Gynecology

## 2022-08-31 ENCOUNTER — Encounter (HOSPITAL_COMMUNITY): Payer: Self-pay | Admitting: Obstetrics and Gynecology

## 2022-08-31 DIAGNOSIS — Z794 Long term (current) use of insulin: Secondary | ICD-10-CM | POA: Diagnosis not present

## 2022-08-31 DIAGNOSIS — O26893 Other specified pregnancy related conditions, third trimester: Principal | ICD-10-CM | POA: Insufficient documentation

## 2022-08-31 DIAGNOSIS — E1010 Type 1 diabetes mellitus with ketoacidosis without coma: Secondary | ICD-10-CM | POA: Diagnosis not present

## 2022-08-31 DIAGNOSIS — Z3A29 29 weeks gestation of pregnancy: Secondary | ICD-10-CM | POA: Diagnosis not present

## 2022-08-31 DIAGNOSIS — R1111 Vomiting without nausea: Secondary | ICD-10-CM | POA: Diagnosis not present

## 2022-08-31 DIAGNOSIS — Z87891 Personal history of nicotine dependence: Secondary | ICD-10-CM | POA: Diagnosis not present

## 2022-08-31 DIAGNOSIS — R112 Nausea with vomiting, unspecified: Secondary | ICD-10-CM | POA: Insufficient documentation

## 2022-08-31 DIAGNOSIS — O24013 Pre-existing diabetes mellitus, type 1, in pregnancy, third trimester: Secondary | ICD-10-CM | POA: Insufficient documentation

## 2022-08-31 DIAGNOSIS — O219 Vomiting of pregnancy, unspecified: Secondary | ICD-10-CM | POA: Diagnosis present

## 2022-08-31 DIAGNOSIS — O210 Mild hyperemesis gravidarum: Secondary | ICD-10-CM

## 2022-08-31 DIAGNOSIS — E108 Type 1 diabetes mellitus with unspecified complications: Principal | ICD-10-CM | POA: Diagnosis present

## 2022-08-31 DIAGNOSIS — R1084 Generalized abdominal pain: Secondary | ICD-10-CM | POA: Diagnosis not present

## 2022-08-31 DIAGNOSIS — E860 Dehydration: Secondary | ICD-10-CM

## 2022-08-31 LAB — CBC
HCT: 37.8 % (ref 36.0–46.0)
Hemoglobin: 12.7 g/dL (ref 12.0–15.0)
MCH: 28.8 pg (ref 26.0–34.0)
MCHC: 33.6 g/dL (ref 30.0–36.0)
MCV: 85.7 fL (ref 80.0–100.0)
Platelets: 387 10*3/uL (ref 150–400)
RBC: 4.41 MIL/uL (ref 3.87–5.11)
RDW: 13.4 % (ref 11.5–15.5)
WBC: 11.2 10*3/uL — ABNORMAL HIGH (ref 4.0–10.5)
nRBC: 0 % (ref 0.0–0.2)

## 2022-08-31 LAB — URINALYSIS, ROUTINE W REFLEX MICROSCOPIC
Bilirubin Urine: NEGATIVE
Glucose, UA: 50 mg/dL — AB
Hgb urine dipstick: NEGATIVE
Ketones, ur: 80 mg/dL — AB
Leukocytes,Ua: NEGATIVE
Nitrite: NEGATIVE
Protein, ur: 100 mg/dL — AB
Specific Gravity, Urine: 1.026 (ref 1.005–1.030)
pH: 5 (ref 5.0–8.0)

## 2022-08-31 LAB — COMPREHENSIVE METABOLIC PANEL
ALT: 15 U/L (ref 0–44)
AST: 23 U/L (ref 15–41)
Albumin: 3.5 g/dL (ref 3.5–5.0)
Alkaline Phosphatase: 96 U/L (ref 38–126)
Anion gap: 18 — ABNORMAL HIGH (ref 5–15)
BUN: 12 mg/dL (ref 6–20)
CO2: 20 mmol/L — ABNORMAL LOW (ref 22–32)
Calcium: 9.4 mg/dL (ref 8.9–10.3)
Chloride: 95 mmol/L — ABNORMAL LOW (ref 98–111)
Creatinine, Ser: 0.63 mg/dL (ref 0.44–1.00)
GFR, Estimated: >60 mL/min (ref >60)
Glucose, Bld: 161 mg/dL — ABNORMAL HIGH (ref 70–99)
Potassium: 3.5 mmol/L (ref 3.5–5.1)
Sodium: 133 mmol/L — ABNORMAL LOW (ref 135–145)
Total Bilirubin: 0.6 mg/dL (ref 0.3–1.2)
Total Protein: 7.7 g/dL (ref 6.5–8.1)

## 2022-08-31 LAB — GLUCOSE, CAPILLARY
Glucose-Capillary: 148 mg/dL — ABNORMAL HIGH (ref 70–99)
Glucose-Capillary: 132 mg/dL — ABNORMAL HIGH (ref 70–99)
Glucose-Capillary: 77 mg/dL (ref 70–99)

## 2022-08-31 LAB — TYPE AND SCREEN
ABO/RH(D): O POS
Antibody Screen: NEGATIVE

## 2022-08-31 LAB — BETA-HYDROXYBUTYRIC ACID: Beta-Hydroxybutyric Acid: 3.34 mmol/L — ABNORMAL HIGH (ref 0.05–0.27)

## 2022-08-31 MED ORDER — PROMETHAZINE HCL 25 MG PO TABS: 25.0000 mg | ORAL_TABLET | Freq: Four times a day (QID) | ORAL | Status: DC | PRN

## 2022-08-31 MED ORDER — ENSURE ENLIVE PO LIQD
237.0000 mL | Freq: Every day | ORAL | Status: DC
  Administered 2022-09-01: 237 mL via ORAL
  Filled 2022-08-31 (×3): qty 237

## 2022-08-31 MED ORDER — SODIUM CHLORIDE 0.9 % IV SOLN
25.0000 mg | Freq: Once | INTRAVENOUS | Status: AC
  Administered 2022-08-31: 25 mg via INTRAVENOUS
  Filled 2022-08-31: qty 1

## 2022-08-31 MED ORDER — PRENATAL VITAMIN 27-0.8 MG PO TABS: 1.0000 | ORAL_TABLET | Freq: Every day | ORAL | Status: DC

## 2022-08-31 MED ORDER — LACTATED RINGERS IV BOLUS
1000.0000 mL | Freq: Once | INTRAVENOUS | Status: AC
  Administered 2022-08-31: 1000 mL via INTRAVENOUS

## 2022-08-31 MED ORDER — CYCLOBENZAPRINE HCL 10 MG PO TABS: 10.0000 mg | ORAL_TABLET | Freq: Three times a day (TID) | ORAL | Status: DC | PRN

## 2022-08-31 MED ORDER — ONDANSETRON 4 MG PO TBDP
4.0000 mg | ORAL_TABLET | Freq: Three times a day (TID) | ORAL | Status: DC | PRN
  Administered 2022-08-31 – 2022-09-01 (×2): 4 mg via ORAL
  Filled 2022-08-31 (×2): qty 1

## 2022-08-31 MED ORDER — SODIUM CHLORIDE 0.9 % IV SOLN: INTRAVENOUS | Status: DC

## 2022-08-31 MED ORDER — ACETAMINOPHEN 325 MG PO TABS: 650.0000 mg | ORAL_TABLET | ORAL | Status: DC | PRN

## 2022-08-31 MED ORDER — INSULIN ASPART 100 UNIT/ML IJ SOLN: 0.0000 [IU] | Freq: Three times a day (TID) | INTRAMUSCULAR | Status: DC

## 2022-08-31 MED ORDER — PANTOPRAZOLE SODIUM 40 MG PO TBEC
40.0000 mg | DELAYED_RELEASE_TABLET | Freq: Every day | ORAL | Status: DC
  Administered 2022-08-31 – 2022-09-01 (×2): 40 mg via ORAL
  Filled 2022-08-31 (×2): qty 1

## 2022-08-31 MED ORDER — HYDROXYZINE HCL 50 MG PO TABS: 50.0000 mg | ORAL_TABLET | Freq: Four times a day (QID) | ORAL | Status: DC | PRN

## 2022-08-31 MED ORDER — ENOXAPARIN SODIUM 40 MG/0.4ML IJ SOSY
40.0000 mg | PREFILLED_SYRINGE | INTRAMUSCULAR | Status: DC
  Administered 2022-08-31: 40 mg via SUBCUTANEOUS
  Filled 2022-08-31: qty 0.4

## 2022-08-31 MED ORDER — METOCLOPRAMIDE HCL 10 MG PO TABS
10.0000 mg | ORAL_TABLET | Freq: Four times a day (QID) | ORAL | Status: DC
  Administered 2022-08-31 – 2022-09-01 (×5): 10 mg via ORAL
  Filled 2022-08-31 (×5): qty 1

## 2022-08-31 MED ORDER — CALCIUM CARBONATE ANTACID 500 MG PO CHEW: 2.0000 | CHEWABLE_TABLET | ORAL | Status: DC | PRN

## 2022-08-31 MED ORDER — ONDANSETRON HCL 4 MG/2ML IJ SOLN
4.0000 mg | Freq: Once | INTRAMUSCULAR | Status: AC
  Administered 2022-08-31: 4 mg via INTRAVENOUS
  Filled 2022-08-31: qty 2

## 2022-08-31 MED ORDER — SCOPOLAMINE 1 MG/3DAYS TD PT72
1.0000 | MEDICATED_PATCH | TRANSDERMAL | Status: DC
  Administered 2022-08-31: 1.5 mg via TRANSDERMAL
  Filled 2022-08-31: qty 1

## 2022-08-31 MED ORDER — PRENATAL MULTIVITAMIN CH
1.0000 | ORAL_TABLET | Freq: Every day | ORAL | Status: DC
  Administered 2022-09-01: 1 via ORAL
  Filled 2022-08-31: qty 1

## 2022-08-31 MED ORDER — INSULIN PUMP
SUBCUTANEOUS | Status: DC
  Filled 2022-08-31: qty 1

## 2022-08-31 MED ORDER — SERTRALINE HCL 50 MG PO TABS
50.0000 mg | ORAL_TABLET | Freq: Every day | ORAL | Status: DC
  Administered 2022-09-01: 50 mg via ORAL
  Filled 2022-08-31: qty 1

## 2022-08-31 NOTE — Progress Notes (Addendum)
2205: RN called to bedside. Pt states that her dexcom is reading 84 and she would like to confirm with hospital's CBG machine.  CBG = 77. Pt states that she feels asymptomatic and would like to try to eat as she has not eaten all day and no longer feels nauseous. Food and juice given. Will continue to monitor.   2252: Pt sitting up eating. Dexcom  reading is 94.

## 2022-08-31 NOTE — Inpatient Diabetes Management (Signed)
Inpatient Diabetes Program Recommendations  Diabetes Treatment Program Recommendations  ADA Standards of Care Diabetes in Pregnancy Target Glucose Ranges:  Fasting: 70 - 95 mg/dL 1 hr postprandial: Less than '140mg'$ /dL (from first bite of meal) 2 hr postprandial: Less than 120 mg/dL (from first bite of meal)    Lab Results  Component Value Date   GLUCAP 132 (H) 08/31/2022   HGBA1C 6.4 (H) 08/14/2022    Review of Glycemic Control  Latest Reference Range & Units 08/31/22 09:46 08/31/22 12:46  Glucose-Capillary 70 - 99 mg/dL 148 (H) 132 (H)  (H): Data is abnormally high Diabetes history: Type 1 DM Outpatient Diabetes medications: Omnipod/Dexcom- in manual mode Current orders for Inpatient glycemic control: Novolog 0-9 units TID & HS, insulin pump (one off order)  Inpatient Diabetes Program Recommendations:    Consider: -adding insulin pump order set -discontinue Novolog 0-9 units TID & HS  Discussed with Dr Damita Dunnings. DM team will follow trends while inpatient. Recently was seen by Abbe Amsterdam, NP who increased basal rates from 0.7 units/hr from 1200-1800 to 0.9 units/hr.   Thanks, Bronson Curb, MSN, RNC-OB Diabetes Coordinator [phone] (8a-5p)

## 2022-08-31 NOTE — Progress Notes (Signed)
Patient IV access removed, fetal monitors removed and discharge papers signed. Upon sitting up for patient to change clothes to go home patient began vomiting again. Zigmund Daniel, NP informed. Admission orders received from Dr. Damita Dunnings.

## 2022-08-31 NOTE — MAU Note (Signed)
Sabrina Sutton is a 27 y.o. at 4w0dhere in MAU reporting: arrived via EMS reporting nausea and vomiting that started last night around 0000-0100 and has thrown up more than 10 times since then. Unable to keep anything down. Took reglan yestrday and it did not help. Denies any LOF or VB. States she has noticed feeling cramping/contractions every 20-30 minutes. +FM  Dexcom reading 184  Onset of complaint: 0000-0100 Pain score: 8-back  Vitals:   08/31/22 0911  BP: 105/66  Pulse: (!) 151  Resp: 18  Temp: 97.8 F (36.6 C)     FHT:155 Lab orders placed from triage:  n/a
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
## 2022-09-01 DIAGNOSIS — Z3A29 29 weeks gestation of pregnancy: Secondary | ICD-10-CM | POA: Diagnosis not present

## 2022-09-01 DIAGNOSIS — O219 Vomiting of pregnancy, unspecified: Secondary | ICD-10-CM

## 2022-09-01 LAB — COMPREHENSIVE METABOLIC PANEL
ALT: 13 U/L (ref 0–44)
AST: 14 U/L — ABNORMAL LOW (ref 15–41)
Albumin: 2.2 g/dL — ABNORMAL LOW (ref 3.5–5.0)
Alkaline Phosphatase: 58 U/L (ref 38–126)
Anion gap: 9 (ref 5–15)
BUN: 5 mg/dL — ABNORMAL LOW (ref 6–20)
CO2: 23 mmol/L (ref 22–32)
Calcium: 8.1 mg/dL — ABNORMAL LOW (ref 8.9–10.3)
Chloride: 100 mmol/L (ref 98–111)
Creatinine, Ser: 0.5 mg/dL (ref 0.44–1.00)
GFR, Estimated: >60 mL/min (ref >60)
Glucose, Bld: 79 mg/dL (ref 70–99)
Potassium: 2.9 mmol/L — ABNORMAL LOW (ref 3.5–5.1)
Sodium: 132 mmol/L — ABNORMAL LOW (ref 135–145)
Total Bilirubin: 0.3 mg/dL (ref 0.3–1.2)
Total Protein: 4.9 g/dL — ABNORMAL LOW (ref 6.5–8.1)

## 2022-09-01 LAB — TSH: TSH: 0.96 u[IU]/mL (ref 0.350–4.500)

## 2022-09-01 LAB — CBC

## 2022-09-01 LAB — HIV ANTIBODY (ROUTINE TESTING W REFLEX): HIV Screen 4th Generation wRfx: NONREACTIVE

## 2022-09-01 LAB — RPR: RPR Ser Ql: NONREACTIVE

## 2022-09-01 LAB — BETA-HYDROXYBUTYRIC ACID
Beta-Hydroxybutyrate: 0.9 mg/dL
Beta-Hydroxybutyric Acid: 0.35 mmol/L — ABNORMAL HIGH (ref 0.05–0.27)

## 2022-09-01 MED ORDER — POTASSIUM CHLORIDE CRYS ER 20 MEQ PO TBCR
20.0000 meq | EXTENDED_RELEASE_TABLET | Freq: Two times a day (BID) | ORAL | Status: DC
  Administered 2022-09-01: 20 meq via ORAL
  Filled 2022-09-01: qty 1

## 2022-09-01 MED ORDER — POTASSIUM CHLORIDE 10 MEQ/100ML IV SOLN
10.0000 meq | INTRAVENOUS | Status: AC
  Administered 2022-09-01 (×4): 10 meq via INTRAVENOUS
  Filled 2022-09-01 (×4): qty 100

## 2022-09-01 MED ORDER — ONDANSETRON 4 MG PO TBDP: 4.0000 mg | ORAL_TABLET | Freq: Three times a day (TID) | ORAL | 0 refills | Status: DC | PRN

## 2022-09-01 MED ORDER — METOCLOPRAMIDE HCL 10 MG PO TABS: 10.0000 mg | ORAL_TABLET | Freq: Four times a day (QID) | ORAL | 3 refills | Status: DC

## 2022-09-01 MED ORDER — SCOPOLAMINE 1 MG/3DAYS TD PT72: 1.0000 | MEDICATED_PATCH | TRANSDERMAL | 12 refills | Status: DC

## 2022-09-01 NOTE — Progress Notes (Signed)
   09/01/22 1807  Departure Condition  Departure Condition Good  Mobility at [facility]  Patient/Caregiver Teaching Teach Back Method Used;Discharge instructions reviewed;Prescriptions reviewed;Follow-up care reviewed;Pain management discussed;Medications discussed;Patient/caregiver verbalized understanding;Educated about hypertension in pregnancy  Departure Mode With family  Was procedural sedation performed on this patient during this visit? No   Patient alert and oriented x4, VS and pain stable.

## 2022-09-01 NOTE — Plan of Care (Signed)
Problem: Education: Goal: Knowledge of disease or condition will improve Outcome: Adequate for Discharge Goal: Knowledge of the prescribed therapeutic regimen will improve Outcome: Adequate for Discharge Goal: Individualized Educational Video(s) Outcome: Adequate for Discharge   Problem: Clinical Measurements: Goal: Complications related to the disease process, condition or treatment will be avoided or minimized Outcome: Adequate for Discharge   Problem: Education: Goal: Knowledge of disease or condition will improve Outcome: Adequate for Discharge Goal: Knowledge of the prescribed therapeutic regimen will improve Outcome: Adequate for Discharge   Problem: Bowel/Gastric: Goal: Occurences of nausea and/or vomiting will decrease Outcome: Adequate for Discharge   Problem: Fluid Volume: Goal: Maintenance of adequate hydration will improve Outcome: Adequate for Discharge   Problem: Nutritional: Goal: Achievement of adequate weight for body size and type will improve Outcome: Adequate for Discharge   Problem: Education: Goal: Ability to describe self-care measures that may prevent or decrease complications (Diabetes Survival Skills Education) will improve Outcome: Adequate for Discharge Goal: Individualized Educational Video(s) Outcome: Adequate for Discharge   Problem: Coping: Goal: Ability to adjust to condition or change in health will improve Outcome: Adequate for Discharge   Problem: Fluid Volume: Goal: Ability to maintain a balanced intake and output will improve Outcome: Adequate for Discharge   Problem: Health Behavior/Discharge Planning: Goal: Ability to identify and utilize available resources and services will improve Outcome: Adequate for Discharge Goal: Ability to manage health-related needs will improve Outcome: Adequate for Discharge   Problem: Metabolic: Goal: Ability to maintain appropriate glucose levels will improve Outcome: Adequate for Discharge    Problem: Nutritional: Goal: Maintenance of adequate nutrition will improve Outcome: Adequate for Discharge Goal: Progress toward achieving an optimal weight will improve Outcome: Adequate for Discharge   Problem: Skin Integrity: Goal: Risk for impaired skin integrity will decrease Outcome: Adequate for Discharge   Problem: Tissue Perfusion: Goal: Adequacy of tissue perfusion will improve Outcome: Adequate for Discharge   Problem: Education: Goal: Knowledge of General Education information will improve Description: Including pain rating scale, medication(s)/side effects and non-pharmacologic comfort measures Outcome: Adequate for Discharge   Problem: Health Behavior/Discharge Planning: Goal: Ability to manage health-related needs will improve Outcome: Adequate for Discharge   Problem: Clinical Measurements: Goal: Ability to maintain clinical measurements within normal limits will improve Outcome: Adequate for Discharge Goal: Will remain free from infection Outcome: Adequate for Discharge Goal: Diagnostic test results will improve Outcome: Adequate for Discharge Goal: Respiratory complications will improve Outcome: Adequate for Discharge Goal: Cardiovascular complication will be avoided Outcome: Adequate for Discharge   Problem: Activity: Goal: Risk for activity intolerance will decrease Outcome: Adequate for Discharge   Problem: Nutrition: Goal: Adequate nutrition will be maintained Outcome: Adequate for Discharge   Problem: Coping: Goal: Level of anxiety will decrease Outcome: Adequate for Discharge   Problem: Elimination: Goal: Will not experience complications related to bowel motility Outcome: Adequate for Discharge Goal: Will not experience complications related to urinary retention Outcome: Adequate for Discharge   Problem: Pain Managment: Goal: General experience of comfort will improve Outcome: Adequate for Discharge   Problem: Safety: Goal: Ability  to remain free from injury will improve Outcome: Adequate for Discharge   Problem: Skin Integrity: Goal: Risk for impaired skin integrity will decrease Outcome: Adequate for Discharge   Problem: Education: Goal: Ability to describe self-care measures that may prevent or decrease complications (Diabetes Survival Skills Education) will improve Outcome: Adequate for Discharge Goal: Individualized Educational Video(s) Outcome: Adequate for Discharge   Problem: Coping: Goal: Ability to adjust to condition or  change in health will improve Outcome: Adequate for Discharge   Problem: Fluid Volume: Goal: Ability to maintain a balanced intake and output will improve Outcome: Adequate for Discharge   Problem: Health Behavior/Discharge Planning: Goal: Ability to identify and utilize available resources and services will improve Outcome: Adequate for Discharge Goal: Ability to manage health-related needs will improve Outcome: Adequate for Discharge   Problem: Metabolic: Goal: Ability to maintain appropriate glucose levels will improve Outcome: Adequate for Discharge   Problem: Nutritional: Goal: Maintenance of adequate nutrition will improve Outcome: Adequate for Discharge Goal: Progress toward achieving an optimal weight will improve Outcome: Adequate for Discharge   Problem: Skin Integrity: Goal: Risk for impaired skin integrity will decrease Outcome: Adequate for Discharge   Problem: Tissue Perfusion: Goal: Adequacy of tissue perfusion will improve Outcome: Adequate for Discharge   Problem: Education: Goal: Knowledge of General Education information will improve Description: Including pain rating scale, medication(s)/side effects and non-pharmacologic comfort measures Outcome: Adequate for Discharge   Problem: Health Behavior/Discharge Planning: Goal: Ability to manage health-related needs will improve Outcome: Adequate for Discharge   Problem: Clinical Measurements: Goal:  Ability to maintain clinical measurements within normal limits will improve Outcome: Adequate for Discharge Goal: Will remain free from infection Outcome: Adequate for Discharge Goal: Diagnostic test results will improve Outcome: Adequate for Discharge Goal: Respiratory complications will improve Outcome: Adequate for Discharge Goal: Cardiovascular complication will be avoided Outcome: Adequate for Discharge   Problem: Activity: Goal: Risk for activity intolerance will decrease Outcome: Adequate for Discharge   Problem: Nutrition: Goal: Adequate nutrition will be maintained Outcome: Adequate for Discharge   Problem: Coping: Goal: Level of anxiety will decrease Outcome: Adequate for Discharge   Problem: Elimination: Goal: Will not experience complications related to bowel motility Outcome: Adequate for Discharge Goal: Will not experience complications related to urinary retention Outcome: Adequate for Discharge   Problem: Pain Managment: Goal: General experience of comfort will improve Outcome: Adequate for Discharge   Problem: Safety: Goal: Ability to remain free from injury will improve Outcome: Adequate for Discharge   Problem: Skin Integrity: Goal: Risk for impaired skin integrity will decrease Outcome: Adequate for Discharge

## 2022-09-01 NOTE — Progress Notes (Signed)
[facility] COMPREHENSIVE PROGRESS NOTE  Sabrina Sutton is a 27 y.o. G1P0 at 1w1dwho is admitted for N/V in pregnancy in setting of T1DM.  Estimated Date of Delivery: 11/16/22 Fetal presentation is unsure.  Length of Stay:  1 Days. Admitted 08/31/2022  Subjective: Feeling overall better. She may want to go home this afternoon if she continues to feel better through lunch and with completion of her potassium.  Patient reports good fetal movement.  She reports occasional uterine contractions, no bleeding and no loss of fluid per vagina.  Vitals:  Blood pressure 111/74, pulse (!) 115, temperature 98 F (36.7 C), temperature source Axillary, resp. rate 16, height 5' 7"$  (1.702 m), weight 53.1 kg, last menstrual period 02/09/2022, SpO2 99 %. Physical Examination: CONSTITUTIONAL: Well-developed, well-nourished female in no acute distress.  NEUROLOGIC: Alert and oriented to person, place, and time. No cranial nerve deficit noted. PSYCHIATRIC: Normal mood and affect. Normal behavior. Normal judgment and thought content. CARDIOVASCULAR: Normal heart rate noted, regular rhythm RESPIRATORY: Effort and breath sounds normal, no problems with respiration noted MUSCULOSKELETAL: Normal range of motion. No edema and no tenderness. 2+ distal pulses. ABDOMEN: Soft, nontender, nondistended, gravid. CERVIX:    Fetal monitoring: FHR: 130 bpm, Variability: moderate, Accelerations: Present, Decelerations: Absent  Uterine activity: Irritable  Results for orders placed or performed during the hospital encounter of 08/31/22 (from the past 48 hour(s))  Comprehensive metabolic panel     Status: Abnormal   Collection Time: 08/31/22  9:27 AM  Result Value Ref Range   Sodium 133 (L) 135 - 145 mmol/L   Potassium 3.5 3.5 - 5.1 mmol/L   Chloride 95 (L) 98 - 111 mmol/L   CO2 20 (L) 22 - 32 mmol/L   Glucose, Bld 161 (H) 70 - 99 mg/dL    Comment: Glucose reference range applies only to samples taken  after fasting for at least 8 hours.   BUN 12 6 - 20 mg/dL   Creatinine, Ser 0.63 0.44 - 1.00 mg/dL   Calcium 9.4 8.9 - 10.3 mg/dL   Total Protein 7.7 6.5 - 8.1 g/dL   Albumin 3.5 3.5 - 5.0 g/dL   AST 23 15 - 41 U/L   ALT 15 0 - 44 U/L   Alkaline Phosphatase 96 38 - 126 U/L   Total Bilirubin 0.6 0.3 - 1.2 mg/dL   GFR, Estimated >60 >60 mL/min    Comment: (NOTE) Calculated using the CKD-EPI Creatinine Equation (2021)    Anion gap 18 (H) 5 - 15    Comment: Performed at [facility], [place] [id number] CBC     Status: Abnormal   Collection Time: 08/31/22  9:27 AM  Result Value Ref Range   WBC 11.2 (H) 4.0 - 10.5 K/uL   RBC 4.41 3.87 - 5.11 MIL/uL   Hemoglobin 12.7 12.0 - 15.0 g/dL   HCT 37.8 36.0 - 46.0 %   MCV 85.7 80.0 - 100.0 fL   MCH 28.8 26.0 - 34.0 pg   MCHC 33.6 30.0 - 36.0 g/dL   RDW 13.4 11.5 - 15.5 %   Platelets 387 150 - 400 K/uL   nRBC 0.0 0.0 - 0.2 %    Comment: [facility]formed at [facility] [address] [place] [address] Urinalysis, Routine w reflex microscopic -Urine, Clean Catch     Status: Abnormal   Collection Time: 08/31/22  9:27 AM  Result Value Ref Range   Color, Urine  YELLOW YELLOW   APPearance HAZY (A) CLEAR   Specific Gravity, Urine 1.026 1.005 - 1.030   pH 5.0 5.0 - 8.0   Glucose, UA 50 (A) NEGATIVE mg/dL   Hgb urine dipstick NEGATIVE NEGATIVE   Bilirubin Urine NEGATIVE NEGATIVE   Ketones, ur 80 (A) NEGATIVE mg/dL   Protein, ur 100 (A) NEGATIVE mg/dL   Nitrite NEGATIVE NEGATIVE   Leukocytes,Ua NEGATIVE NEGATIVE   RBC / HPF 0-5 0 - 5 RBC/hpf   WBC, UA 0-5 0 - 5 WBC/hpf   Bacteria, UA RARE (A) NONE SEEN   Squamous Epithelial / HPF 0-5 0 - 5 /HPF   Mucus PRESENT     Comment: Performed at [facility] [address] [place], [place] [address]  Glucose, capillary     Status: Abnormal   Collection Time: 08/31/22  9:46 AM  Result Value Ref Range   Glucose-Capillary 148 (H) 70 - 99 mg/dL     Comment: Glucose reference range applies only to samples taken after fasting for at least 8 hours.  Beta-hydroxybutyric acid     Status: Abnormal   Collection Time: 08/31/22 10:17 AM  Result Value Ref Range   Beta-Hydroxybutyric Acid 3.34 (H) 0.05 - 0.27 mmol/L    Comment: Performed at [facility] [address], [place], [place] [address]  Type and screen [place]     Status: None   Collection Time: 08/31/22 10:17 AM  Result Value Ref Range   ABO/RH(D) O POS    Antibody Screen NEG    Sample Expiration      09/03/2022,2359 Performed at [facility], [place] [address], [place], [place] [address]   Glucose, capillary     Status: Abnormal   Collection Time: 08/31/22 12:46 PM  Result Value Ref Range   Glucose-Capillary 132 (H) 70 - 99 mg/dL    Comment: Glucose reference range applies only to samples taken after fasting for at least 8 hours.  Glucose, capillary     Status: None   Collection Time: 08/31/22 10:06 PM  Result Value Ref Range   Glucose-Capillary 77 70 - 99 mg/dL    Comment: Glucose reference range applies only to samples taken after fasting for at least 8 hours.  TSH     Status: None   Collection Time: 09/01/22  5:47 AM  Result Value Ref Range   TSH 0.960 0.350 - 4.500 uIU/mL    Comment: Performed by a 3rd Generation assay with a functional sensitivity of <=0.01 uIU/mL. Performed at [facility], [place] [address] [place], [place] [address]   Comprehensive metabolic panel     Status: Abnormal   Collection Time: 09/01/22  5:47 AM  Result Value Ref Range   Sodium 132 (L) 135 - 145 mmol/L   Potassium 2.9 (L) 3.5 - 5.1 mmol/L   Chloride 100 98 - 111 mmol/L   CO2 23 22 - 32 mmol/L   Glucose, Bld 79 70 - 99 mg/dL    Comment: Glucose reference range applies only to samples taken after fasting for at least 8 hours.   BUN 5 (L) 6 - 20 mg/dL   Creatinine, Ser 0.50 0.44 - 1.00 mg/dL   Calcium 8.1 (L) 8.9 - 10.3 mg/dL   Total Protein 4.9 (L)  6.5 - 8.1 g/dL   Albumin 2.2 (L) 3.5 - 5.0 g/dL   AST 14 (L) 15 - 41 U/L   ALT 13 0 - 44 U/L  Alkaline Phosphatase 58 38 - 126 U/L   Total Bilirubin 0.3 0.3 - 1.2 mg/dL   GFR, Estimated >60 >60 mL/min    Comment: (NOTE) Calculated using the CKD-EPI Creatinine Equation (2021)    Anion gap 9 5 - 15    Comment: Performed at [facility] [address], [place], [place] [address]  Beta-hydroxybutyric acid     Status: Abnormal   Collection Time: 09/01/22  5:47 AM  Result Value Ref Range   Beta-Hydroxybutyric Acid 0.35 (H) 0.05 - 0.27 mmol/L    Comment: Performed at [facility] [address] [place], [place] [address]    No results found.  Current scheduled medications  enoxaparin (LOVENOX) injection  40 mg Subcutaneous Q24H   feeding supplement  237 mL Oral Q0600   insulin pump   Subcutaneous Q4H   metoCLOPramide  10 mg Oral Q6H   pantoprazole  40 mg Oral Daily   potassium chloride  20 mEq Oral BID   prenatal multivitamin  1 tablet Oral Q1200   scopolamine  1 patch Transdermal Q72H   sertraline  50 mg Oral Daily    I have reviewed the patient's current medications.  ASSESSMENT: Principal Problem:   Nausea and vomiting of pregnancy, antepartum Active Problems:   Type 1 diabetes mellitus with complications (HCC)   PLAN: N/V pregnancy - Improved on the Zofran/Reglan and Scop patch.  - Likely ready for d/c today but if not, we will anticipate d/c tomorrow.   Hypokalemia - Will replete with some IV and some PO today so minimize burning/time and avoid emesis.   T1DM - Doing omnipod/dexcom. No lows overnight per my review.   Continue routine antenatal care. Possible d/c this afternoon, if not, likely tomorrow.    Radene Gunning, MD, [place] [facility], [place]

## 2022-09-01 NOTE — Discharge Summary (Signed)
Antenatal Physician Discharge Summary  Patient ID: Sabrina Sutton MRN: [id number] DOB/AGE: 01-11-1996 27 y.o.  Admit date: 08/31/2022 Discharge date: 09/01/2022  Admission Diagnoses: Nausea and vomiting of pregnancy, antepartum [O21.9] Type 1 Diabetes  Discharge Diagnoses:  Same  Prenatal Procedures: NST  Consults: DM coordinator  Brief Hospital Course:  Sabrina Sutton is a 27 y.o. G1P0 at 19w1dwho was admitted for hyperemesis or N/V of pregnancy in the setting of T1DM. Her symptoms were not able to be controlled as an outpatient, so she was admitted for observation.   She was given IV fluids and started on antiemetics which helped her symptoms significantly.  Her electrolytes were repleted as needed. She then tolerated clear liquid diet and was advanced to regular carb modified diet, which she also tolerated. She felt much improved and desired discharge home. She had no other obstetric concerns and was deemed stable for discharge to home with outpatient follow up.    Discharge Exam: Temp:  [97.5 F (36.4 C)-98.5 F (36.9 C)] 98.5 F (36.9 C) (02/09 1622) Pulse Rate:  [88-115] 108 (02/09 1622) Resp:  [16-18] 16 (02/09 1622) BP: (104-120)/(57-78) 120/77 (02/09 1622) SpO2:  [98 %-100 %] 99 % (02/09 1622) Physical Examination: CONSTITUTIONAL: Well-developed, well-nourished female in no acute distress.  HENT:  Normocephalic, atraumatic, External right and left ear normal.  EYES: Conjunctivae and EOM are normal. Pupils are equal, round, and reactive to light. No scleral icterus.  NECK: Normal range of motion, supple, no masses SKIN: Skin is warm and dry. No rash noted. Not diaphoretic. No erythema. No pallor. NEUROLOGIC: Alert and oriented to person, place, and time. Normal reflexes, muscle tone coordination. No cranial nerve deficit noted. PSYCHIATRIC: Normal mood and affect. Normal behavior. Normal judgment and thought content. CARDIOVASCULAR: Normal heart rate noted, regular  rhythm RESPIRATORY: Effort and breath sounds normal, no problems with respiration noted MUSCULOSKELETAL: Normal range of motion. No edema and no tenderness. 2+ distal pulses. ABDOMEN: Soft, nontender, nondistended, gravid. CERVIX:  Not examined  Fetal monitoring: FHR: 130 bpm, Variability: moderate, Accelerations: Present, Decelerations: Absent  Uterine activity: Irritability  Significant Diagnostic Studies:  Results for orders placed or performed during the hospital encounter of 08/31/22 (from the past 168 hour(s))  Comprehensive metabolic panel   Collection Time: 08/31/22  9:27 AM  Result Value Ref Range   Sodium 133 (L) 135 - 145 mmol/L   Potassium 3.5 3.5 - 5.1 mmol/L   Chloride 95 (L) 98 - 111 mmol/L   CO2 20 (L) 22 - 32 mmol/L   Glucose, Bld 161 (H) 70 - 99 mg/dL   BUN 12 6 - 20 mg/dL   Creatinine, Ser 0.63 0.44 - 1.00 mg/dL   Calcium 9.4 8.9 - 10.3 mg/dL   Total Protein 7.7 6.5 - 8.1 g/dL   Albumin 3.5 3.5 - 5.0 g/dL   AST 23 15 - 41 U/L   ALT 15 0 - 44 U/L   Alkaline Phosphatase 96 38 - 126 U/L   Total Bilirubin 0.6 0.3 - 1.2 mg/dL   GFR, Estimated >60 >60 mL/min   Anion gap 18 (H) 5 - 15  CBC   Collection Time: 08/31/22  9:27 AM  Result Value Ref Range   WBC 11.2 (H) 4.0 - 10.5 K/uL   RBC 4.41 3.87 - 5.11 MIL/uL   Hemoglobin 12.7 12.0 - 15.0 g/dL   HCT 37.8 36.0 - 46.0 %   MCV 85.7 80.0 - 100.0 fL   MCH 28.8 26.0 - 34.0 pg  MCHC 33.6 30.0 - 36.0 g/dL   RDW 13.4 11.5 - 15.5 %   Platelets 387 150 - 400 K/uL   nRBC 0.0 0.0 - 0.2 %  Urinalysis, Routine w reflex microscopic -Urine, Clean Catch   Collection Time: 08/31/22  9:27 AM  Result Value Ref Range   Color, Urine YELLOW YELLOW   APPearance HAZY (A) CLEAR   Specific Gravity, Urine 1.026 1.005 - 1.030   pH 5.0 5.0 - 8.0   Glucose, UA 50 (A) NEGATIVE mg/dL   Hgb urine dipstick NEGATIVE NEGATIVE   Bilirubin Urine NEGATIVE NEGATIVE   Ketones, ur 80 (A) NEGATIVE mg/dL   Protein, ur 100 (A) NEGATIVE mg/dL    Nitrite NEGATIVE NEGATIVE   Leukocytes,Ua NEGATIVE NEGATIVE   RBC / HPF 0-5 0 - 5 RBC/hpf   WBC, UA 0-5 0 - 5 WBC/hpf   Bacteria, UA RARE (A) NONE SEEN   Squamous Epithelial / HPF 0-5 0 - 5 /HPF   Mucus PRESENT   Glucose, capillary   Collection Time: 08/31/22  9:46 AM  Result Value Ref Range   Glucose-Capillary 148 (H) 70 - 99 mg/dL  Beta-hydroxybutyric acid   Collection Time: 08/31/22 10:17 AM  Result Value Ref Range   Beta-Hydroxybutyric Acid 3.34 (H) 0.05 - 0.27 mmol/L  Type and screen [place]   Collection Time: 08/31/22 10:17 AM  Result Value Ref Range   ABO/RH(D) O POS    Antibody Screen NEG    Sample Expiration      09/03/2022,2359 Performed at [facility], [address], [place], [place] [address]   Glucose, capillary   Collection Time: 08/31/22 12:46 PM  Result Value Ref Range   Glucose-Capillary 132 (H) 70 - 99 mg/dL  Glucose, capillary   Collection Time: 08/31/22 10:06 PM  Result Value Ref Range   Glucose-Capillary 77 70 - 99 mg/dL  TSH   Collection Time: 09/01/22  5:47 AM  Result Value Ref Range   TSH 0.960 0.350 - 4.500 uIU/mL  Comprehensive metabolic panel   Collection Time: 09/01/22  5:47 AM  Result Value Ref Range   Sodium 132 (L) 135 - 145 mmol/L   Potassium 2.9 (L) 3.5 - 5.1 mmol/L   Chloride 100 98 - 111 mmol/L   CO2 23 22 - 32 mmol/L   Glucose, Bld 79 70 - 99 mg/dL   BUN 5 (L) 6 - 20 mg/dL   Creatinine, Ser 0.50 0.44 - 1.00 mg/dL   Calcium 8.1 (L) 8.9 - 10.3 mg/dL   Total Protein 4.9 (L) 6.5 - 8.1 g/dL   Albumin 2.2 (L) 3.5 - 5.0 g/dL   AST 14 (L) 15 - 41 U/L   ALT 13 0 - 44 U/L   Alkaline Phosphatase 58 38 - 126 U/L   Total Bilirubin 0.3 0.3 - 1.2 mg/dL   GFR, Estimated >60 >60 mL/min   Anion gap 9 5 - 15  Beta-hydroxybutyric acid   Collection Time: 09/01/22  5:47 AM  Result Value Ref Range   Beta-Hydroxybutyric Acid 0.35 (H) 0.05 - 0.27 mmol/L   [place] MFM OB FOLLOW UP  Result Date:  08/21/2022 ----------------------------------------------------------------------  OBSTETRICS REPORT                       (Signed Final 08/21/2022 05:07 pm) ---------------------------------------------------------------------- Patient Info  ID #:       [id number]  D.O.B.:  Jan 03, 1996 (27 yrs)  Name:       [facility]              Visit Date: 08/21/2022 02:34 pm ---------------------------------------------------------------------- Performed By  Attending:        Valeda Malm DO       Ref. Address:     [address] [place]                                                             [place], [place]  Performed By:     Jeanene Erb BS,      Secondary Phy.:   [facility] MAU/Triage                    RDMS  Referred By:      Annice Needy              Location:         [facility]                               [facility]                                                             [place] [facility]                                                             [facility] ---------------------------------------------------------------------- Orders  #  Description                           Code        Ordered By  1  [place] MFM OB FOLLOW UP                   [id number]    Valeda Malm ----------------------------------------------------------------------  #  Order #  Accession #                Episode #  1  [id number]                   [id number]                 [id number] ---------------------------------------------------------------------- Indications  Poor fetal growth (<20%)                       O36.5920  Diabetes - Pregestational, 2nd trimester       O24.312  Hyperemesis gravidarum                         O21.0  [redacted] weeks gestation of pregnancy                Z3A.27  Encounter for other antenatal screening        Z36.2   follow-up ---------------------------------------------------------------------- Vital Signs  BP:          115/71 ---------------------------------------------------------------------- Fetal Evaluation  Num Of Fetuses:         1  Fetal Heart Rate(bpm):  140  Cardiac Activity:       Observed  Presentation:           Cephalic  Placenta:               Anterior  P. Cord Insertion:      Previously visualized  Amniotic Fluid  AFI FV:      Within normal limits                              Largest Pocket(cm)                              6.68 ---------------------------------------------------------------------- Biometry  BPD:      70.6  mm     G. Age:  28w 2d         65  %    CI:        76.57   %    70 - 86                                                          FL/HC:      19.6   %    18.8 - 20.6  HC:      255.6  mm     G. Age:  27w 5d         26  %    HC/AC:      1.16        1.05 - 1.21  AC:      220.7  mm     G. Age:  26w 4d         14  %    FL/BPD:     71.1   %    71 - 87  FL:       50.2  mm     G. Age:  27w 0d         19  %    FL/AC:      22.7   %  20 - 24  Est. FW:    1000  gm      2 lb 3 oz     16  % ---------------------------------------------------------------------- OB History  Gravidity:    1 ---------------------------------------------------------------------- Gestational Age  LMP:           27w 4d        Date:  02/09/22                  EDD:   11/16/22  U/S Today:     27w 3d                                        EDD:   11/17/22  Best:          27w 4d     Det. By:  LMP  (02/09/22)          EDD:   11/16/22 ---------------------------------------------------------------------- Anatomy  Cranium:               Appears normal         Aortic Arch:            Previously seen  Cavum:                 Appears normal         Ductal Arch:            Previously seen  Ventricles:            Appears normal         Diaphragm:              Appears normal  Choroid Plexus:        Previously seen        Stomach:                 Appears normal, left                                                                        sided  Cerebellum:            Previously seen        Abdomen:                Appears normal  Posterior Fossa:       Previously seen        Abdominal Wall:         Previously seen  Nuchal Fold:           Previously seen        Cord Vessels:           Previously seen  Face:                  Orbits and profile     Kidneys:                Appear normal                         previously seen  Lips:  Previously seen        Bladder:                Appears normal  Thoracic:              Appears normal         Spine:                  Previously seen  Heart:                 Appears normal         Upper Extremities:      Previously seen                         (4CH, axis, and                         situs)  RVOT:                  Previously seen        Lower Extremities:      Previously seen  LVOT:                  Previously seen  Other:  Technically difficult due to fetal position. ---------------------------------------------------------------------- Doppler - Fetal Vessels  Umbilical Artery   S/D     %tile      RI    %tile                      PSV    ADFV    RDFV                                                     (cm/s)   3.24       59    0.69       63                      47.9      No      No ---------------------------------------------------------------------- Comments  The patient is here for a follow-up ultrasound at 27w 4d. EDD:  11/16/2022 dated by LMP  (02/09/22). She has no concerns  today and reports good glycemic control.  Sonographic findings  Single intrauterine pregnancy.  Fetal cardiac activity:  Observed and appears normal.  Presentation: Cephalic.  Interval fetal anatomy appears normal.  Fetal biometry shows the estimated fetal weight at the 16  percentile and the abdominal circumference at the 14th  percentile.  Amniotic fluid volume: Within normal limits. MVP: 6.68 cm.  Placenta: Anterior.   Recommendations  - F/u for growth [place] in 3 weeks  - Weekly antenatal testing to start at 10 weeks ----------------------------------------------------------------------                  Valeda Malm, DO Electronically Signed Final Report   08/21/2022 05:07 pm ----------------------------------------------------------------------  [place] MFM UA CORD DOPPLER  Result Date: 08/14/2022 ----------------------------------------------------------------------  OBSTETRICS REPORT                        (Signed Final 08/14/2022 10:34 am) ---------------------------------------------------------------------- Patient Info  ID #:       [id number]  D.O.B.:  12/01/1995 (26 yrs)  Name:       [facility]              Visit Date: 08/14/2022 09:09 am ---------------------------------------------------------------------- Performed By  Attending:        Sander Nephew      Ref. Address:      [place]                                                              [place], [place]  Performed By:     Stephenie Acres        Secondary Phy.:    [facility] MAU/Triage                    BS RDMS  Referred By:      Annice Needy              Location:          [facility]                    ECKSTAT MD                                [place] ---------------------------------------------------------------------- Orders  #  Description                           Code        Ordered By  1  [place] MFM UA CORD DOPPLER                76820.02    [place]  2  [place] MFM OB LIMITED                     [id number]    Radene Gunning ----------------------------------------------------------------------  #  Order #                     Accession #                Episode #  1  [id number]                   [id number]                 [id number]  2  [id number]  [id number]                 [id number] ---------------------------------------------------------------------- Indications  Maternal care for known or suspected poor       O36.5920  fetal growth, second trimester, not applicable  or unspecified IUGR  Diabetes - Pregestational, 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 Cord Insertion:       Previously visualized  Amniotic Fluid  AFI FV:      Within normal limits                              Largest Pocket(cm)                              4.8 ---------------------------------------------------------------------- Biometry  LV:        3.9  mm ---------------------------------------------------------------------- OB History  Gravidity:    1 ---------------------------------------------------------------------- Gestational Age  LMP:           26w 4d        Date:  02/09/22                  EDD:   11/16/22  Best:          Vicenta Dunning 4d     Det. By:  LMP  (02/09/22)          EDD:   11/16/22 ---------------------------------------------------------------------- Anatomy  Cranium:               Appears normal         Diaphragm:              Appears normal  Cavum:                 Appears normal         Stomach:                Appears normal, left                                                                        sided  Ventricles:            Appears normal         Kidneys:                Appear normal  Heart:                 Small Pericardial  Bladder:                Appears normal                         effusion ---------------------------------------------------------------------- Doppler - Fetal Vessels  Umbilical Artery    S/D     %tile      RI    %tile      PI    %tile     PSV    ADFV    RDFV                                                     (cm/s)    3.1       46     0.7       63     1.1       61     47.9      No      No ---------------------------------------------------------------------- Impression  Antenatal testing due to IUGR with an EFW 6th%  There is good fetal movement and amniotic fluid volume  UA Dopplers are normal with no evidence of AEDF or REDF  There is a small physiologic pericardial effusion. ---------------------------------------------------------------------- Recommendations  Continue weekly testing with UA Dopplers and serial growth. ----------------------------------------------------------------------              Sander Nephew, MD Electronically Signed Final Report   08/14/2022 10:34 am ----------------------------------------------------------------------  [place] MFM OB LIMITED  Result Date: 08/14/2022 ----------------------------------------------------------------------  OBSTETRICS REPORT                        (Signed Final 08/14/2022 10:34 am) ---------------------------------------------------------------------- Patient Info  ID #:       [id number]                          D.O.B.:  1996/06/27 (26 yrs)  Name:       [facility]              Visit Date: 08/14/2022 09:09 am ---------------------------------------------------------------------- Performed By  Attending:        Sander Nephew      Ref. Address:      [place], [place]  [id number]  Performed By:     Stephenie Acres        Secondary Phy.:    [facility] MAU/Triage                    BS RDMS  Referred By:      Annice Needy              Location:          [facility]                    ECKSTAT MD                                 [place] ---------------------------------------------------------------------- Orders  #  Description                           Code        Ordered By  1  [place] MFM UA CORD DOPPLER                76820.02    [place]  2  [place] MFM OB LIMITED                     [id number]    Jaziel Bennett ----------------------------------------------------------------------  #  Order #                     Accession #                Episode #  1  [id number]                   [id number]                 [id number]  2  [id number]                   [id number]                 [id number] ---------------------------------------------------------------------- Indications  Maternal care for known or suspected poor       O36.5920  fetal growth, second trimester, not applicable  or unspecified IUGR  Diabetes - Pregestational, 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 Cord Insertion:       Previously visualized  Amniotic Fluid  AFI FV:      Within normal limits                              Largest Pocket(cm)  4.8 ---------------------------------------------------------------------- Biometry  LV:        3.9  mm ---------------------------------------------------------------------- OB History  Gravidity:    1 ---------------------------------------------------------------------- Gestational Age  LMP:           26w 4d        Date:  02/09/22                  EDD:   11/16/22  Best:          Vicenta Dunning 4d     Det. By:  LMP  (02/09/22)          EDD:   11/16/22 ---------------------------------------------------------------------- Anatomy  Cranium:               Appears normal          Diaphragm:              Appears normal  Cavum:                 Appears normal         Stomach:                Appears normal, left                                                                        sided  Ventricles:            Appears normal         Kidneys:                Appear normal  Heart:                 Small Pericardial      Bladder:                Appears normal                         effusion ---------------------------------------------------------------------- Doppler - Fetal Vessels  Umbilical Artery    S/D    %tile      RI    %tile      PI    %tile     PSV    ADFV    RDFV                                                     (cm/s)    3.1       46     0.7       63     1.1       61     47.9      No      No ---------------------------------------------------------------------- Impression  Antenatal testing due to IUGR with an EFW 6th%  There is good fetal movement and amniotic fluid volume  UA Dopplers are normal with no evidence of AEDF or REDF  There is a small physiologic pericardial effusion. ---------------------------------------------------------------------- Recommendations  Continue weekly testing with UA Dopplers and serial growth. ----------------------------------------------------------------------  Sander Nephew, MD Electronically Signed Final Report   08/14/2022 10:34 am ----------------------------------------------------------------------  [place] MFM OB Limited  Result Date: 08/09/2022 ----------------------------------------------------------------------  OBSTETRICS REPORT                       (Signed Final 08/09/2022 02:56 pm) ---------------------------------------------------------------------- Patient Info  ID #:       [id number]                          D.O.B.:  June 03, 1996 (26 yrs)  Name:       [facility]              Visit Date: 08/09/2022 01:51 pm ---------------------------------------------------------------------- Performed By  Attending:        Johnell Comings MD         Ref. Address:     [address] [place]                                                             [place], [place]  Performed By:     Berlinda Last          Secondary Phy.:   [facility] MAU/Triage                    RDMS  Referred By:      Annice Needy              Location:         [facility]                    ECKSTAT MD                               [place] ---------------------------------------------------------------------- Orders  #  Description                           Code        Ordered By  1  [place] MFM OB LIMITED                     [id number]    MATTHEW ECKSTAT ----------------------------------------------------------------------  #  Order #                     Accession #                Episode #  1  [id number]  [id number]                 [id number] ---------------------------------------------------------------------- Indications  Diabetes - Pregestational, 2nd trimester       O64.312  Maternal care for known or suspected poor      O36.5920  fetal growth, second trimester, not applicable  or unspecified IUGR  [redacted] weeks gestation of pregnancy                Z3A.25 ---------------------------------------------------------------------- Fetal Evaluation  Num Of Fetuses:         1  Fetal Heart Rate(bpm):  150  Cardiac Activity:       Observed  Presentation:           Transverse, head to maternal left  Placenta:               Anterior  P. Cord Insertion:      Visualized  Amniotic Fluid  AFI FV:      Within normal limits                              Largest Pocket(cm)                              4.3  Comment:    No placental abruption or previa identified. ---------------------------------------------------------------------- OB History  Gravidity:    1 ---------------------------------------------------------------------- Gestational Age  LMP:            25w 6d        Date:  02/09/22                  EDD:   11/16/22  Best:          25w 6d     Det. By:  LMP  (02/09/22)          EDD:   11/16/22 ---------------------------------------------------------------------- Anatomy  Cranium:               Appears normal         Diaphragm:              Appears normal  Heart:                 Pericardial            Stomach:                Appears normal, left                         effusion                                                                        sided  RVOT:                  Appears normal         Kidneys:                Appear normal  LVOT:                  Appears normal  Bladder:                Appears normal ---------------------------------------------------------------------- Doppler - Fetal Vessels  Umbilical Artery   S/D     %tile      RI    %tile      PI    %tile     PSV    ADFV    RDFV                                                     (cm/s)    2.5       12     0.6       12     0.9       18     39.9      No      No ---------------------------------------------------------------------- Cervix Uterus Adnexa  Cervix  Length:            3.3  cm.  Normal appearance by transabdominal scan  Uterus  No abnormality visualized.  Right Ovary  Within normal limits.  Left Ovary  Within normal limits.  Adnexa  No abnormality visualized ---------------------------------------------------------------------- Comments  This patient presented to the MAU due to abdominal  pain/cramping.  A limited ultrasound performed today shows that the fetus is  in the transverse presentation.  There was normal amniotic fluid noted.  Doppler studies of the umbilical arteries continues to show  normal forward flow with a normal S/D ratio of 2.5.  There  were no signs of absent or reversed end-diastolic flow.  Her cervical length appeared 3.3 cm long without any signs of  funneling.  The patient had a deceleration while in the MAU.  She should  be placed on prolonged  monitoring.  Should the patient continue to have contractions, a course of  Indocin may be considered for tocolysis. ----------------------------------------------------------------------                   Johnell Comings, MD Electronically Signed Final Report   08/09/2022 02:56 pm ----------------------------------------------------------------------  [place] MFM OB LIMITED  Result Date: 08/08/2022 ----------------------------------------------------------------------  OBSTETRICS REPORT                       (Signed Final 08/08/2022 11:26 am) ---------------------------------------------------------------------- Patient Info  ID #:       [id number]                          D.O.B.:  1995/09/13 (26 yrs)  Name:       [facility]              Visit Date: 08/08/2022 10:41 am ---------------------------------------------------------------------- Performed By  Attending:        Johnell Comings MD         Ref. Address:     [address]                                                             [place], [place]  [id number]  Performed By:     Milus Glazier,      [facility]                                                             [place] [facility]                                                             [facility]  Referred By:      Osborne Oman MD ---------------------------------------------------------------------- Orders  #  Description                           Code        Ordered By  1  [place] MFM OB LIMITED                     76815.01    Valeda Malm  2  [place] MFM UA CORD DOPPLER                76820.02    [facility] ----------------------------------------------------------------------  #  Order #                     Accession #                Episode #  1  [id number]                   [id number]                 [id number]  2  [id number]                    [id number]                 [id number] ---------------------------------------------------------------------- Indications  Maternal care for known or suspected poor      O36.5920  fetal growth, second trimester, not applicable  or unspecified IUGR  Diabetes - Pregestational, 2nd trimester       O59.312  [redacted] weeks gestation of pregnancy                Z3A.25 ---------------------------------------------------------------------- Fetal Evaluation  Num Of Fetuses:         1  Fetal Heart Rate(bpm):  151  Cardiac Activity:       Observed  Presentation:           Breech  Placenta:               Posterior  P. Cord Insertion:  Visualized  Amniotic Fluid  AFI FV:      Within normal limits  AFI Sum(cm)     %Tile       Largest Pocket(cm)  12.79           33          3.99  RUQ(cm)       RLQ(cm)       LUQ(cm)        LLQ(cm)  3.99          2.48          3.31           3.01 ---------------------------------------------------------------------- OB History  Gravidity:    1 ---------------------------------------------------------------------- Gestational Age  LMP:           25w 5d        Date:  02/09/22                  EDD:   11/16/22  Best:          Melvyn Novas 5d     Det. By:  LMP  (02/09/22)          EDD:   11/16/22 ---------------------------------------------------------------------- Anatomy  Cranium:               Appears normal         Aortic Arch:            Previously seen  Cavum:                 Appears normal         Ductal Arch:            Previously seen  Ventricles:            Appears normal         Diaphragm:              Appears normal  Choroid Plexus:        Previously seen        Stomach:                Appears normal, left                                                                        sided  Cerebellum:            Previously seen        Abdomen:                Appears normal  Posterior Fossa:       Previously seen        Abdominal Wall:         Previously seen  Nuchal Fold:           Previously seen        Cord  Vessels:           Previously seen  Face:                  Orbits and profile     Kidneys:                Appear normal  previously seen  Lips:                  Previously seen        Bladder:                Appears normal  Thoracic:              Appears normal         Spine:                  Appears normal  Heart:                 Appears normal         Upper Extremities:      Previously seen                         (4CH, axis, and                         situs)  RVOT:                  Previously seen        Lower Extremities:      Previously seen  LVOT:                  Previously seen ---------------------------------------------------------------------- Doppler - Fetal Vessels  Umbilical Artery   S/D     %tile      RI    %tile      PI    %tile     PSV    ADFV    RDFV                                                     (cm/s)    3.9       78    0.71       64    1.18       71    46.56      No      No ---------------------------------------------------------------------- Cervix Uterus Adnexa  Right Ovary  Not visualized.  Left Ovary  Not visualized. ---------------------------------------------------------------------- Comments  This patient was seen due to an IUGR fetus.  She denies any  problems since her last exam and reports feeling fetal  movements throughout the day.  Her pregnancy has also  been complicated by pregestational diabetes (type I).  The total AFI was 12.79 cm (within normal limits).  Doppler studies of the umbilical arteries performed due to  fetal growth restriction showed a normal S/D ratio of 3.9.  There were no signs of absent or reversed end-diastolic flow  noted today.  Due to pregestational diabetes, she was given a referral for a  fetal echocardiogram with [facility] pediatric cardiology.  She  reports that she has not been contacted yet for a fetal  echocardiogram.  She will return in 1 week for another umbilical artery Doppler  study.  We will start weekly NSTs at 28  weeks. ----------------------------------------------------------------------                   Johnell Comings, MD Electronically Signed Final Report   08/08/2022 11:26 am ----------------------------------------------------------------------  [place] MFM UA CORD DOPPLER  Result Date: 08/08/2022 ----------------------------------------------------------------------  OBSTETRICS  REPORT                       (Signed Final 08/08/2022 11:26 am) ---------------------------------------------------------------------- Patient Info  ID #:       [id number]                          D.O.B.:  Aug 19, 1995 (26 yrs)  Name:       [facility]              Visit Date: 08/08/2022 10:41 am ---------------------------------------------------------------------- Performed By  Attending:        Johnell Comings MD         Ref. Address:     [address]                                                             [place], [place]  Performed By:     Milus Glazier,      Location[facility]                                     [facility] at                                                             [place] for                                                             Women  Referred By:      Osborne Oman MD ---------------------------------------------------------------------- Orders  #  Description                           Code        Ordered By  1  [place] MFM OB LIMITED                     76815.01    [place]  2  [place] MFM UA CORD DOPPLER                [id number]    BURK SCHAIBLE ----------------------------------------------------------------------  #  Order #                     Accession #                Episode #  1  [id number]                   [id number]                 [id number]  2  [id number]                   [id number]                 [id number]  ---------------------------------------------------------------------- Indications  Maternal care for known or suspected poor      O36.5920  fetal growth, second trimester, not applicable  or unspecified IUGR  Diabetes - Pregestational, 2nd trimester       O24.312  [redacted] weeks gestation of pregnancy                Z3A.25 ---------------------------------------------------------------------- Fetal Evaluation  Num Of Fetuses:         1  Fetal Heart Rate(bpm):  151  Cardiac Activity:       Observed  Presentation:           Breech  Placenta:               Posterior  P. Cord Insertion:      Visualized  Amniotic Fluid  AFI FV:      Within normal limits  AFI Sum(cm)     %Tile       Largest Pocket(cm)  12.79           33          3.99  RUQ(cm)       RLQ(cm)       LUQ(cm)        LLQ(cm)  3.99          2.48          3.31           3.01 ---------------------------------------------------------------------- OB History  Gravidity:    1 ---------------------------------------------------------------------- Gestational Age  LMP:           25w 5d        Date:  02/09/22                  EDD:   11/16/22  Best:          25w 5d     Det. By:  LMP  (02/09/22)          EDD:   11/16/22 ---------------------------------------------------------------------- Anatomy  Cranium:               Appears normal         Aortic Arch:            Previously seen  Cavum:                 Appears normal         Ductal Arch:            Previously seen  Ventricles:            Appears normal         Diaphragm:              Appears normal  Choroid Plexus:        Previously seen  Stomach:                Appears normal, left                                                                        sided  Cerebellum:            Previously seen        Abdomen:                Appears normal  Posterior Fossa:       Previously seen        Abdominal Wall:         Previously seen  Nuchal Fold:           Previously seen        Cord Vessels:           Previously seen  Face:                   Orbits and profile     Kidneys:                Appear normal                         previously seen  Lips:                  Previously seen        Bladder:                Appears normal  Thoracic:              Appears normal         Spine:                  Appears normal  Heart:                 Appears normal         Upper Extremities:      Previously seen                         (4CH, axis, and                         situs)  RVOT:                  Previously seen        Lower Extremities:      Previously seen  LVOT:                  Previously seen ---------------------------------------------------------------------- Doppler - Fetal Vessels  Umbilical Artery   S/D     %tile      RI    %tile      PI    %tile     PSV    ADFV    RDFV                                                     (  cm/s)    3.9       78    0.71       64    1.18       71    46.56      No      No ---------------------------------------------------------------------- Cervix Uterus Adnexa  Right Ovary  Not visualized.  Left Ovary  Not visualized. ---------------------------------------------------------------------- Comments  This patient was seen due to an IUGR fetus.  She denies any  problems since her last exam and reports feeling fetal  movements throughout the day.  Her pregnancy has also  been complicated by pregestational diabetes (type I).  The total AFI was 12.79 cm (within normal limits).  Doppler studies of the umbilical arteries performed due to  fetal growth restriction showed a normal S/D ratio of 3.9.  There were no signs of absent or reversed end-diastolic flow  noted today.  Due to pregestational diabetes, she was given a referral for a  fetal echocardiogram with [facility] pediatric cardiology.  She  reports that she has not been contacted yet for a fetal  echocardiogram.  She will return in 1 week for another umbilical artery Doppler  study.  We will start weekly NSTs at 28 weeks.  ----------------------------------------------------------------------                   Johnell Comings, MD Electronically Signed Final Report   08/08/2022 11:26 am ----------------------------------------------------------------------   Future Appointments  Date Time Provider [place]  09/06/2022 11:15 AM Chancy Milroy, MD [facility] None  09/11/2022  1:45 PM [facility] NURSE [facility]  09/11/2022  2:00 PM [facility] US1 [facility]  09/20/2022 11:15 AM Chancy Milroy, MD [place] None  09/21/2022  2:30 PM Brita Romp, NP [facility] None  09/25/2022 10:45 AM [facility] NURSE [facility]  09/25/2022 11:00 AM [facility] US1 [facility]  10/02/2022 10:45 AM [facility] NURSE [facility]  10/02/2022 11:00 AM [facility] US1 [facility]  10/04/2022 11:15 AM Woodroe Mode, MD [facility] None  10/09/2022 12:30 PM [facility] NURSE [facility]  10/09/2022 12:45 PM [facility] US4 [facility]  10/18/2022 11:15 AM Chancy Milroy, MD [place] None    Discharge Condition: Stable  Discharge disposition: 01-Home or Self Care       Discharge Instructions     Activity as tolerated - No restrictions   Complete by: As directed    Call MD for:   Complete by: As directed    Decreased fetal movement, contractions, and vaginal bleeding.   Call MD for:  difficulty breathing, headache or visual disturbances   Complete by: As directed    Call MD for:  persistant nausea and vomiting   Complete by: As directed    Call MD for:  redness, tenderness, or signs of infection (pain, swelling, redness, odor or green/yellow discharge around incision site)   Complete by: As directed    Call MD for:  severe uncontrolled pain   Complete by: As directed    Call MD for:  temperature >100.4   Complete by: As directed    Diet Carb Modified   Complete by: As directed    Discharge patient   Complete by: As directed    Discharge disposition: 01-Home or Self Care   Discharge patient date: 08/31/2022   May shower / [place]   Complete by:  As directed       Allergies as of 09/01/2022   No Known Allergies      Medication List  TAKE these medications    Accu-Chek Guide test strip Generic drug: glucose blood Use as instructed to monitor glucose 8 times daily (before meals, after meals) due to gestational diabetes.   accu-chek soft touch lancets Use as directed.   Accu-Chek FastClix Lancets Misc Check sugar 10 x daily   Alcohol Pads 70 % Pads Use to wipe skin prior to insulin injections twice daily   B-D UF III MINI PEN NEEDLES 31G X 5 MM Misc Generic drug: Insulin Pen Needle USE TO CHECK BLOOD SUGAR IN THE MORNING BEFORE EATING, BEFORE EACH MEAL, AND AS NEEDED   Blood Pressure Kit Devi 1 kit by Does not apply route once a week.   cyclobenzaprine 10 MG tablet Commonly known as: FLEXERIL Take 1 tablet (10 mg total) by mouth 3 (three) times daily as needed for muscle spasms.   Dexcom G6 Receiver Devi USE AS DIRECTED   Dexcom G6 Sensor Misc Inject 1 applicator into the skin as directed. (change sensor every 10 days)   Dexcom G6 Transmitter Misc INJECT 1 DEVICE UNDER THE SKIN AS DIRECTED UP TO 8 TIMES WITH EACH NEW SENSOR   Ensure High Protein Liqd Take 1 each by mouth daily at 6 (six) AM.   feeding supplement Liqd Take 237 mLs by mouth 2 (two) times daily between meals.   Gojji Weight Scale Misc 1 Device by Does not apply route every 30 (thirty) days.   hydrOXYzine 50 MG tablet Commonly known as: ATARAX Take 1 tablet (50 mg total) by mouth every 6 (six) hours as needed for nausea or vomiting.   insulin lispro 100 UNIT/ML injection Commonly known as: HumaLOG Inject 0.08 mLs (8 Units total) into the skin once for 1 dose. Use with Omnipod for TDD around 40 units daily   insulin lispro 100 UNIT/ML KwikPen Commonly known as: HumaLOG KwikPen Use as backup to Omnipod, inject per SSI up to 20 units total daily   insulin pump Soln Inject into the skin every 4 (four) hours.   metoCLOPramide 10 MG  tablet Commonly known as: REGLAN Take 1 tablet (10 mg total) by mouth every 6 (six) hours. What changed: Another medication with the same name was added. Make sure you understand how and when to take each.   metoCLOPramide 10 MG tablet Commonly known as: REGLAN Take 1 tablet (10 mg total) by mouth every 6 (six) hours. What changed: You were already taking a medication with the same name, and this prescription was added. Make sure you understand how and when to take each.   Omnipod 5 G6 Pod (Gen 5) Misc 1 Units by Does not apply route every 3 (three) days.   ondansetron 4 MG disintegrating tablet Commonly known as: ZOFRAN-ODT Take 1-2 tablets (4-8 mg total) by mouth every 8 (eight) hours as needed for nausea or vomiting. What changed: Another medication with the same name was added. Make sure you understand how and when to take each.   ondansetron 4 MG disintegrating tablet Commonly known as: ZOFRAN-ODT Take 1 tablet (4 mg total) by mouth every 8 (eight) hours as needed for nausea or vomiting (second line after reglan). What changed: You were already taking a medication with the same name, and this prescription was added. Make sure you understand how and when to take each.   OneTouch Verio Flex System w/Device Kit Use to monitor glucose 8 times daily before and after meals.   pantoprazole 40 MG tablet Commonly known as: PROTONIX Take 1 tablet (40 mg total)  by mouth daily.   Prenatal Vitamin 27-0.8 MG Tabs Take 1 tablet by mouth daily.   promethazine 25 MG tablet Commonly known as: PHENERGAN Take 1 tablet (25 mg total) by mouth every 6 (six) hours as needed for nausea or vomiting.   scopolamine 1 MG/3DAYS Commonly known as: TRANSDERM-SCOP Place 1 patch (1.5 mg total) onto the skin every 3 (three) days. What changed: Another medication with the same name was added. Make sure you understand how and when to take each.   scopolamine 1 MG/3DAYS Commonly known as:  TRANSDERM-SCOP Place 1 patch (1.5 mg total) onto the skin every 3 (three) days. Start taking on: September 03, 2022 What changed: You were already taking a medication with the same name, and this prescription was added. Make sure you understand how and when to take each.   sertraline 50 MG tablet Commonly known as: Zoloft Take 1 tablet (50 mg total) by mouth daily.        Follow-up [place] for [facility] Follow up on 09/06/2022.   Specialty: Obstetrics and Gynecology Why: Follow up on 2/14 as scheduled.  The office has been contacted to schedule infusions for you and should contact you regarding them. Contact information: [address] [place] [phone]                Total discharge time: 20 minutes   Signed: Radene Gunning M.D. 09/01/2022, 4:28 PM

## 2022-09-01 NOTE — Progress Notes (Signed)
Patient did not require insulin for the 1200 insulin check per [facility]. Patient required meal coverage, and self administered 5.1 units of insulin by pump.

## 2022-09-03 ENCOUNTER — Other Ambulatory Visit: Payer: Self-pay

## 2022-09-03 ENCOUNTER — Encounter (HOSPITAL_COMMUNITY): Payer: Self-pay | Admitting: Obstetrics & Gynecology

## 2022-09-03 ENCOUNTER — Inpatient Hospital Stay (HOSPITAL_COMMUNITY)
Admission: AD | Admit: 2022-09-03 | Discharge: 2022-09-05 | DRG: 831 | Disposition: A | Discharge status: Home / Self Care | Payer: Medicaid Other | Attending: Obstetrics and Gynecology | Admitting: Obstetrics and Gynecology

## 2022-09-03 ENCOUNTER — Observation Stay (HOSPITAL_COMMUNITY): Payer: Medicaid Other

## 2022-09-03 ENCOUNTER — Inpatient Hospital Stay (HOSPITAL_COMMUNITY): Payer: Medicaid Other

## 2022-09-03 DIAGNOSIS — Z3A29 29 weeks gestation of pregnancy: Secondary | ICD-10-CM

## 2022-09-03 DIAGNOSIS — E10649 Type 1 diabetes mellitus with hypoglycemia without coma: Secondary | ICD-10-CM | POA: Diagnosis present

## 2022-09-03 DIAGNOSIS — K8000 Calculus of gallbladder with acute cholecystitis without obstruction: Secondary | ICD-10-CM | POA: Diagnosis present

## 2022-09-03 DIAGNOSIS — N852 Hypertrophy of uterus: Secondary | ICD-10-CM | POA: Diagnosis not present

## 2022-09-03 DIAGNOSIS — K8020 Calculus of gallbladder without cholecystitis without obstruction: Secondary | ICD-10-CM | POA: Diagnosis present

## 2022-09-03 DIAGNOSIS — O219 Vomiting of pregnancy, unspecified: Secondary | ICD-10-CM | POA: Diagnosis present

## 2022-09-03 DIAGNOSIS — O99613 Diseases of the digestive system complicating pregnancy, third trimester: Secondary | ICD-10-CM | POA: Diagnosis present

## 2022-09-03 DIAGNOSIS — O26613 Liver and biliary tract disorders in pregnancy, third trimester: Principal | ICD-10-CM | POA: Diagnosis present

## 2022-09-03 DIAGNOSIS — R112 Nausea with vomiting, unspecified: Secondary | ICD-10-CM | POA: Diagnosis present

## 2022-09-03 DIAGNOSIS — R531 Weakness: Secondary | ICD-10-CM | POA: Diagnosis not present

## 2022-09-03 DIAGNOSIS — R1111 Vomiting without nausea: Secondary | ICD-10-CM | POA: Diagnosis not present

## 2022-09-03 DIAGNOSIS — Z87891 Personal history of nicotine dependence: Secondary | ICD-10-CM

## 2022-09-03 DIAGNOSIS — O24013 Pre-existing diabetes mellitus, type 1, in pregnancy, third trimester: Secondary | ICD-10-CM | POA: Diagnosis present

## 2022-09-03 DIAGNOSIS — R188 Other ascites: Secondary | ICD-10-CM | POA: Diagnosis not present

## 2022-09-03 DIAGNOSIS — O212 Late vomiting of pregnancy: Secondary | ICD-10-CM | POA: Diagnosis present

## 2022-09-03 DIAGNOSIS — N1330 Unspecified hydronephrosis: Secondary | ICD-10-CM | POA: Diagnosis not present

## 2022-09-03 DIAGNOSIS — Z794 Long term (current) use of insulin: Secondary | ICD-10-CM

## 2022-09-03 LAB — GLUCOSE, CAPILLARY
Glucose-Capillary: 80 mg/dL (ref 70–99)
Glucose-Capillary: 59 mg/dL — ABNORMAL LOW (ref 70–99)
Glucose-Capillary: 168 mg/dL — ABNORMAL HIGH (ref 70–99)
Glucose-Capillary: 147 mg/dL — ABNORMAL HIGH (ref 70–99)

## 2022-09-03 LAB — CBC WITH DIFFERENTIAL/PLATELET
Abs Immature Granulocytes: 0.13 10*3/uL — ABNORMAL HIGH (ref 0.00–0.07)
Abs Immature Granulocytes: 0.09 10*3/uL — ABNORMAL HIGH (ref 0.00–0.07)
Basophils Absolute: 0 10*3/uL (ref 0.0–0.1)
Basophils Absolute: 0 10*3/uL (ref 0.0–0.1)
Basophils Relative: 0 %
Basophils Relative: 0 %
Eosinophils Absolute: 0 10*3/uL (ref 0.0–0.5)
Eosinophils Absolute: 0 10*3/uL (ref 0.0–0.5)
Eosinophils Relative: 0 %
Eosinophils Relative: 0 %
HCT: 32.5 % — ABNORMAL LOW (ref 36.0–46.0)
HCT: 38.7 % (ref 36.0–46.0)
Hemoglobin: 11.6 g/dL — ABNORMAL LOW (ref 12.0–15.0)
Hemoglobin: 13.4 g/dL (ref 12.0–15.0)
Immature Granulocytes: 1 %
Immature Granulocytes: 1 %
Lymphocytes Relative: 22 %
Lymphocytes Relative: 25 %
Lymphs Abs: 2.2 10*3/uL (ref 0.7–4.0)
Lymphs Abs: 3 10*3/uL (ref 0.7–4.0)
MCH: 30 pg (ref 26.0–34.0)
MCH: 29.5 pg (ref 26.0–34.0)
MCHC: 35.7 g/dL (ref 30.0–36.0)
MCHC: 34.6 g/dL (ref 30.0–36.0)
MCV: 84 fL (ref 80.0–100.0)
MCV: 85.1 fL (ref 80.0–100.0)
Monocytes Absolute: 0.6 10*3/uL (ref 0.1–1.0)
Monocytes Absolute: 0.8 10*3/uL (ref 0.1–1.0)
Monocytes Relative: 6 %
Monocytes Relative: 6 %
Neutro Abs: 6.9 10*3/uL (ref 1.7–7.7)
Neutro Abs: 8.4 10*3/uL — ABNORMAL HIGH (ref 1.7–7.7)
Neutrophils Relative %: 71 %
Neutrophils Relative %: 68 %
Platelets: 315 10*3/uL (ref 150–400)
Platelets: 303 10*3/uL (ref 150–400)
RBC: 3.87 MIL/uL (ref 3.87–5.11)
RBC: 4.55 MIL/uL (ref 3.87–5.11)
RDW: 13.5 % (ref 11.5–15.5)
RDW: 13.6 % (ref 11.5–15.5)
WBC: 9.8 10*3/uL (ref 4.0–10.5)
WBC: 12.3 10*3/uL — ABNORMAL HIGH (ref 4.0–10.5)
nRBC: 0 % (ref 0.0–0.2)
nRBC: 0 % (ref 0.0–0.2)

## 2022-09-03 LAB — COMPREHENSIVE METABOLIC PANEL
ALT: 13 U/L (ref 0–44)
ALT: 14 U/L (ref 0–44)
AST: 19 U/L (ref 15–41)
AST: 24 U/L (ref 15–41)
Albumin: 2.9 g/dL — ABNORMAL LOW (ref 3.5–5.0)
Albumin: 3.3 g/dL — ABNORMAL LOW (ref 3.5–5.0)
Alkaline Phosphatase: 93 U/L (ref 38–126)
Alkaline Phosphatase: 98 U/L (ref 38–126)
Anion gap: 13 (ref 5–15)
Anion gap: 13 (ref 5–15)
BUN: <5 mg/dL — ABNORMAL LOW (ref 6–20)
BUN: <5 mg/dL — ABNORMAL LOW (ref 6–20)
CO2: 21 mmol/L — ABNORMAL LOW (ref 22–32)
CO2: 22 mmol/L (ref 22–32)
Calcium: 8.7 mg/dL — ABNORMAL LOW (ref 8.9–10.3)
Calcium: 9 mg/dL (ref 8.9–10.3)
Chloride: 100 mmol/L (ref 98–111)
Chloride: 100 mmol/L (ref 98–111)
Creatinine, Ser: 0.48 mg/dL (ref 0.44–1.00)
Creatinine, Ser: 0.66 mg/dL (ref 0.44–1.00)
GFR, Estimated: >60 mL/min (ref >60)
GFR, Estimated: >60 mL/min (ref >60)
Glucose, Bld: 92 mg/dL (ref 70–99)
Glucose, Bld: 144 mg/dL — ABNORMAL HIGH (ref 70–99)
Potassium: 3.1 mmol/L — ABNORMAL LOW (ref 3.5–5.1)
Potassium: 3.5 mmol/L (ref 3.5–5.1)
Sodium: 134 mmol/L — ABNORMAL LOW (ref 135–145)
Sodium: 135 mmol/L (ref 135–145)
Total Bilirubin: 0.1 mg/dL — ABNORMAL LOW (ref 0.3–1.2)
Total Bilirubin: 0.6 mg/dL (ref 0.3–1.2)
Total Protein: 6.6 g/dL (ref 6.5–8.1)
Total Protein: 7.4 g/dL (ref 6.5–8.1)

## 2022-09-03 LAB — TYPE AND SCREEN
ABO/RH(D): O POS
Antibody Screen: NEGATIVE

## 2022-09-03 LAB — BETA-HYDROXYBUTYRIC ACID: Beta-Hydroxybutyric Acid: 0.35 mmol/L — ABNORMAL HIGH (ref 0.05–0.27)

## 2022-09-03 LAB — PROTEIN / CREATININE RATIO, URINE
Creatinine, Urine: 106 mg/dL
Protein Creatinine Ratio: 0.18 mg/mg{Cre} — ABNORMAL HIGH (ref 0.00–0.15)
Total Protein, Urine: 19 mg/dL

## 2022-09-03 LAB — LIPASE, BLOOD
Lipase: 24 U/L (ref 11–51)
Lipase: 23 U/L (ref 11–51)

## 2022-09-03 LAB — AMYLASE
Amylase: 150 U/L — ABNORMAL HIGH (ref 28–100)
Amylase: 171 U/L — ABNORMAL HIGH (ref 28–100)

## 2022-09-03 MED ORDER — DIPHENHYDRAMINE HCL 50 MG/ML IJ SOLN
50.0000 mg | Freq: Once | INTRAMUSCULAR | Status: AC
  Administered 2022-09-03: 50 mg via INTRAVENOUS
  Filled 2022-09-03: qty 1

## 2022-09-03 MED ORDER — LACTATED RINGERS BOLUS PEDS
1000.0000 mL | Freq: Once | INTRAVENOUS | Status: AC
  Administered 2022-09-03: 1000 mL via INTRAVENOUS

## 2022-09-03 MED ORDER — SODIUM CHLORIDE 0.9 % IV SOLN: INTRAVENOUS | Status: DC

## 2022-09-03 MED ORDER — ACETAMINOPHEN 325 MG PO TABS: 650.0000 mg | ORAL_TABLET | ORAL | Status: DC | PRN

## 2022-09-03 MED ORDER — CALCIUM CARBONATE ANTACID 500 MG PO CHEW: 2.0000 | CHEWABLE_TABLET | ORAL | Status: DC | PRN

## 2022-09-03 MED ORDER — LACTATED RINGERS IV SOLN
125.0000 mL/h | INTRAVENOUS | Status: DC
  Administered 2022-09-03 – 2022-09-05 (×8): 125 mL/h via INTRAVENOUS

## 2022-09-03 MED ORDER — LACTATED RINGERS IV BOLUS
1000.0000 mL | Freq: Once | INTRAVENOUS | Status: AC
  Administered 2022-09-03: 1000 mL via INTRAVENOUS

## 2022-09-03 MED ORDER — PROMETHAZINE HCL 25 MG RE SUPP
25.0000 mg | Freq: Once | RECTAL | Status: AC
  Administered 2022-09-03: 25 mg via RECTAL
  Filled 2022-09-03: qty 1

## 2022-09-03 MED ORDER — SODIUM CHLORIDE 0.9 % IV SOLN
25.0000 mg | Freq: Four times a day (QID) | INTRAVENOUS | Status: DC | PRN
  Administered 2022-09-04 – 2022-09-05 (×4): 25 mg via INTRAVENOUS
  Filled 2022-09-03 (×4): qty 1

## 2022-09-03 MED ORDER — SODIUM CHLORIDE 0.9 % IV SOLN
8.0000 mg | Freq: Once | INTRAVENOUS | Status: AC
  Administered 2022-09-03: 8 mg via INTRAVENOUS
  Filled 2022-09-03: qty 4

## 2022-09-03 MED ORDER — LORAZEPAM 2 MG/ML IJ SOLN
1.0000 mg | Freq: Once | INTRAMUSCULAR | Status: AC
  Administered 2022-09-03: 1 mg via INTRAVENOUS
  Filled 2022-09-03: qty 1

## 2022-09-03 MED ORDER — SODIUM CHLORIDE 0.9 % IV SOLN
25.0000 mg | Freq: Once | INTRAVENOUS | Status: AC
  Administered 2022-09-03: 25 mg via INTRAVENOUS
  Filled 2022-09-03: qty 1

## 2022-09-03 MED ORDER — PRENATAL MULTIVITAMIN CH
1.0000 | ORAL_TABLET | Freq: Every day | ORAL | Status: DC
  Administered 2022-09-05: 1 via ORAL
  Filled 2022-09-03: qty 1

## 2022-09-03 MED ORDER — DEXTROSE 50 % IV SOLN
1.0000 | Freq: Once | INTRAVENOUS | Status: AC
  Administered 2022-09-03: 50 mL via INTRAVENOUS
  Filled 2022-09-03: qty 50

## 2022-09-03 MED ORDER — ONDANSETRON HCL 4 MG/2ML IJ SOLN
4.0000 mg | Freq: Once | INTRAMUSCULAR | Status: AC
  Administered 2022-09-03: 4 mg via INTRAVENOUS
  Filled 2022-09-03: qty 2

## 2022-09-03 MED ORDER — SODIUM CHLORIDE 0.9 % IV SOLN: 25.0000 mg | INTRAVENOUS | Status: DC

## 2022-09-03 NOTE — MAU Note (Signed)
.  Sabrina Sutton is a 27 y.o. at 85w3dhere in MAU reporting: vomiting that started around 0100 and worsened around 0700.  Pt states she took her reglan at 0800, but has been unable to pick up her rx for other meds.  Denies LOF or vag.  Reporting some lower back pain and irreg ctx.    Onset of complaint: 0100 Pain score: 7/10 Vitals:   09/03/22 1245 09/03/22 1246  BP: (!) 127/90 128/86  Pulse: (!) 109 93  Resp: 18 13  Temp: 99.3 F (37.4 C) 98.8 F (37.1 C)  SpO2:  99%     FHT:140 Lab orders placed from triage:   ua
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
## 2022-09-04 ENCOUNTER — Ambulatory Visit: Payer: Medicaid Other

## 2022-09-04 ENCOUNTER — Other Ambulatory Visit: Payer: Medicaid Other

## 2022-09-04 DIAGNOSIS — O219 Vomiting of pregnancy, unspecified: Secondary | ICD-10-CM | POA: Diagnosis not present

## 2022-09-04 DIAGNOSIS — R112 Nausea with vomiting, unspecified: Secondary | ICD-10-CM | POA: Diagnosis present

## 2022-09-04 DIAGNOSIS — O212 Late vomiting of pregnancy: Secondary | ICD-10-CM | POA: Diagnosis not present

## 2022-09-04 DIAGNOSIS — N1330 Unspecified hydronephrosis: Secondary | ICD-10-CM | POA: Diagnosis not present

## 2022-09-04 DIAGNOSIS — O24013 Pre-existing diabetes mellitus, type 1, in pregnancy, third trimester: Secondary | ICD-10-CM | POA: Diagnosis not present

## 2022-09-04 DIAGNOSIS — O99613 Diseases of the digestive system complicating pregnancy, third trimester: Secondary | ICD-10-CM | POA: Diagnosis not present

## 2022-09-04 DIAGNOSIS — Z87891 Personal history of nicotine dependence: Secondary | ICD-10-CM | POA: Diagnosis not present

## 2022-09-04 DIAGNOSIS — O26613 Liver and biliary tract disorders in pregnancy, third trimester: Secondary | ICD-10-CM | POA: Diagnosis not present

## 2022-09-04 DIAGNOSIS — K8000 Calculus of gallbladder with acute cholecystitis without obstruction: Secondary | ICD-10-CM | POA: Diagnosis not present

## 2022-09-04 DIAGNOSIS — Z3A29 29 weeks gestation of pregnancy: Secondary | ICD-10-CM | POA: Diagnosis not present

## 2022-09-04 DIAGNOSIS — E10649 Type 1 diabetes mellitus with hypoglycemia without coma: Secondary | ICD-10-CM | POA: Diagnosis not present

## 2022-09-04 DIAGNOSIS — Z794 Long term (current) use of insulin: Secondary | ICD-10-CM | POA: Diagnosis not present

## 2022-09-04 DIAGNOSIS — K8020 Calculus of gallbladder without cholecystitis without obstruction: Secondary | ICD-10-CM | POA: Diagnosis not present

## 2022-09-04 LAB — COMPREHENSIVE METABOLIC PANEL
ALT: 15 U/L (ref 0–44)
AST: 30 U/L (ref 15–41)
Albumin: 2.9 g/dL — ABNORMAL LOW (ref 3.5–5.0)
Alkaline Phosphatase: 85 U/L (ref 38–126)
Anion gap: 13 (ref 5–15)
BUN: 6 mg/dL (ref 6–20)
CO2: 19 mmol/L — ABNORMAL LOW (ref 22–32)
Calcium: 8.5 mg/dL — ABNORMAL LOW (ref 8.9–10.3)
Chloride: 101 mmol/L (ref 98–111)
Creatinine, Ser: 0.79 mg/dL (ref 0.44–1.00)
GFR, Estimated: >60 mL/min (ref >60)
Glucose, Bld: 160 mg/dL — ABNORMAL HIGH (ref 70–99)
Potassium: 3.2 mmol/L — ABNORMAL LOW (ref 3.5–5.1)
Sodium: 133 mmol/L — ABNORMAL LOW (ref 135–145)
Total Bilirubin: 0.4 mg/dL (ref 0.3–1.2)
Total Protein: 6.2 g/dL — ABNORMAL LOW (ref 6.5–8.1)

## 2022-09-04 LAB — GLUCOSE, CAPILLARY
Glucose-Capillary: 222 mg/dL — ABNORMAL HIGH (ref 70–99)
Glucose-Capillary: 259 mg/dL — ABNORMAL HIGH (ref 70–99)
Glucose-Capillary: 209 mg/dL — ABNORMAL HIGH (ref 70–99)
Glucose-Capillary: 63 mg/dL — ABNORMAL LOW (ref 70–99)
Glucose-Capillary: 63 mg/dL — ABNORMAL LOW (ref 70–99)
Glucose-Capillary: 76 mg/dL (ref 70–99)

## 2022-09-04 MED ORDER — FENTANYL CITRATE (PF) 100 MCG/2ML IJ SOLN
50.0000 ug | Freq: Once | INTRAMUSCULAR | Status: AC
  Administered 2022-09-04: 50 ug via INTRAVENOUS
  Filled 2022-09-04: qty 2

## 2022-09-04 MED ORDER — MENTHOL 3 MG MT LOZG
1.0000 | LOZENGE | OROMUCOSAL | Status: DC | PRN
  Administered 2022-09-04: 3 mg via ORAL
  Filled 2022-09-04: qty 9

## 2022-09-04 MED ORDER — METOCLOPRAMIDE HCL 5 MG/ML IJ SOLN
10.0000 mg | Freq: Three times a day (TID) | INTRAMUSCULAR | Status: DC
  Administered 2022-09-04 – 2022-09-05 (×3): 10 mg via INTRAVENOUS
  Filled 2022-09-04 (×3): qty 2

## 2022-09-04 MED ORDER — SODIUM CHLORIDE 0.9 % IV SOLN
8.0000 mg | Freq: Three times a day (TID) | INTRAVENOUS | Status: DC
  Administered 2022-09-04 – 2022-09-05 (×4): 8 mg via INTRAVENOUS
  Filled 2022-09-04 (×6): qty 4

## 2022-09-04 MED ORDER — INSULIN ASPART 100 UNIT/ML IJ SOLN
5.0000 [IU] | Freq: Once | INTRAMUSCULAR | Status: AC
  Administered 2022-09-04: 5 [IU] via SUBCUTANEOUS

## 2022-09-04 MED ORDER — INSULIN PUMP
Freq: Three times a day (TID) | SUBCUTANEOUS | Status: DC
  Filled 2022-09-04: qty 1

## 2022-09-04 MED ORDER — TERBUTALINE SULFATE 1 MG/ML IJ SOLN
0.2500 mg | Freq: Once | INTRAMUSCULAR | Status: AC
  Administered 2022-09-04: 0.25 mg via SUBCUTANEOUS
  Filled 2022-09-04: qty 1

## 2022-09-04 MED ORDER — LACTATED RINGERS IV BOLUS
1000.0000 mL | Freq: Once | INTRAVENOUS | Status: AC
  Administered 2022-09-04: 1000 mL via INTRAVENOUS

## 2022-09-04 NOTE — Progress Notes (Signed)
Inpatient Diabetes Program Recommendations  AACE/ADA: New Consensus Statement on Inpatient Glycemic Control (2015)  Target Ranges:  Prepandial:   less than 140 mg/dL      Peak postprandial:   less than 180 mg/dL (1-2 hours)      Critically ill patients:  140 - 180 mg/dL   Lab Results  Component Value Date   GLUCAP 209 (H) 09/04/2022   HGBA1C 6.4 (H) 08/14/2022    Review of Glycemic Control  Latest Reference Range & Units 09/03/22 19:34 09/03/22 22:01 09/04/22 02:06 09/04/22 06:05 09/04/22 07:34  Glucose-Capillary 70 - 99 mg/dL 168 (H) 147 (H) 222 (H) 259 (H) 209 (H)   Diabetes history: DM 1 Outpatient Diabetes medications:  Omnipod insulin pump- does not use auto mode Basals: 12a-6a- 0.9 units/ hr 6a-MN- 0.7 units/hr 1 unit/15 grams CHO and 1 unit drops blood sugar 50 mg/dL Current orders for Inpatient glycemic control:  None  Inpatient Diabetes Program Recommendations:    Note patient took insulin pump off overnight for MRI.  She has now put pump back on with above settings.  Discussed with patient.  She is wearing Dexcom and insulin pump at this time.  She states that she feels better today.   Thanks,  Adah Perl, RN, BC-ADM Inpatient Diabetes Coordinator Pager [phone]  (8a-5p)

## 2022-09-04 NOTE — Progress Notes (Signed)
Hypoglycemic Event  CBG: 63  Treatment: 4 oz juice/soda  Symptoms: Shaky  Follow-up CBG: Time:2236 CBG Result:63  Treatment: 4 oz juice, italian ice  Follow-up CBG: Time: 2254  CBG Result:76   Possible Reasons for Event: Vomiting and Inadequate meal intake   Comments/MD notified:Dr. Eure notified, no new orders received    Benedict Needy

## 2022-09-04 NOTE — Progress Notes (Signed)
[place] NOTE  Sabrina Sutton is a 27 y.o. G1P0 at 75w4dwho is admitted for persistent nausea and vomiting.  Estimated Date of Delivery: 11/16/22 Fetal presentation is unsure.  Length of Stay:  0 Days. Admitted 09/03/2022  Subjective: Pt seen this morning.  She states she feels a little better.  Nausea was noted at level of 6-7 yesterday.  Today she states she has almost no nausea.  She is willing to try to start liquids and then advance her diet. Patient reports normal fetal movement.  She denies uterine contractions currently, but did receive terbutaline  last night.  She denies bleeding and leaking of fluid per vagina.  Vitals:  Blood pressure (!) 100/57, pulse 100, temperature 97.7 F (36.5 C), temperature source Oral, resp. rate 16, weight 55.3 kg, last menstrual period 02/09/2022, SpO2 100 %. Physical Examination: CONSTITUTIONAL: Well-developed, well-nourished female in no acute distress.  HENT:  Normocephalic, atraumatic, External right and left ear normal. Oropharynx is clear and moist EYES: Conjunctivae and EOM are normal.  NECK: Normal range of motion, supple, no masses. SKIN: Skin is warm and dry. No rash noted. Not diaphoretic. No erythema. No pallor. [place] Alert and oriented to person, place, and time. Normal reflexes, muscle tone coordination. No cranial nerve deficit noted. PSYCHIATRIC: Normal mood and affect. Normal behavior. Normal judgment and thought content. CARDIOVASCULAR: Normal heart rate noted, regular rhythm RESPIRATORY: Effort and breath sounds normal, no problems with respiration noted MUSCULOSKELETAL: Normal range of motion. No edema and no tenderness. ABDOMEN: Soft, nontender, nondistended, gravid. CERVIX: deferred  Fetal monitoring: FHR: 130s bpm, Variability: moderate, Accelerations: Present, Decelerations: Absent  Uterine activity:  none currently  Results for orders placed or performed during the hospital encounter of 09/03/22 (from the past 48 hour(s))  Glucose, capillary     Status: None   Collection Time: 09/03/22 12:53 PM  Result Value Ref Range   Glucose-Capillary 80 70 - 99 mg/dL    Comment: Glucose reference range applies only to samples taken after fasting for at least 8 hours.  CBC with Differential/Platelet     Status: Abnormal   Collection Time: 09/03/22  1:00 PM  Result Value Ref Range   WBC 9.8 4.0 - 10.5 K/uL   RBC 3.87 3.87 - 5.11 MIL/uL   Hemoglobin 11.6 (L) 12.0 - 15.0 g/dL   HCT 32.5 (L) 36.0 - 46.0 %   MCV 84.0 80.0 - 100.0 fL   MCH 30.0 26.0 - 34.0 pg   MCHC 35.7 30.0 - 36.0 g/dL   RDW 13.5 11.5 - 15.5 %   Platelets 315 150 - 400 K/uL   nRBC 0.0 0.0 - 0.2 %   Neutrophils Relative % 71 %   Neutro Abs 6.9 1.7 - 7.7 K/uL   Lymphocytes Relative 22 %   Lymphs Abs 2.2 0.7 - 4.0 K/uL   Monocytes Relative 6 %   Monocytes Absolute 0.6 0.1 - 1.0 K/uL   Eosinophils Relative 0 %   Eosinophils Absolute 0.0 0.0 - 0.5 K/uL   Basophils Relative 0 %   Basophils Absolute 0.0 0.0 - 0.1 K/uL   Immature Granulocytes 1 %   Abs Immature Granulocytes 0.13 (H) 0.00 - 0.07 K/uL    Comment: Performed at [facility], [address] [place], [place] [address]  Comprehensive metabolic panel     Status: Abnormal   Collection Time: 09/03/22  1:00 PM  Result Value Ref Range   Sodium 134 (L) 135 - 145 mmol/L    Potassium 3.1 (L) 3.5 - 5.1 mmol/L   Chloride 100 98 - 111 mmol/L   CO2 21 (L) 22 - 32 mmol/L   Glucose, Bld 92 70 - 99 mg/dL    Comment: Glucose reference range applies only to samples taken after fasting for at least 8 hours.   BUN <5 (L) 6 - 20 mg/dL   Creatinine, Ser 0.48 0.44 - 1.00 mg/dL   Calcium 8.7 (L) 8.9 - 10.3 mg/dL   Total Protein 6.6 6.5 - 8.1 g/dL   Albumin 2.9 (L) 3.5 - 5.0 g/dL   AST 19 15 - 41 U/L   ALT 13 0 - 44 U/L   Alkaline Phosphatase 93 38 - 126 U/L   Total Bilirubin 0.1 (L) 0.3 - 1.2 mg/dL   GFR, Estimated >60 >60 mL/min    Comment: (NOTE) Calculated using the CKD-EPI Creatinine Equation (2021)    Anion gap 13 5 - 15    Comment: Performed at [facility], [place] [address], [place], [place] [address]  Amylase     Status: Abnormal   Collection Time: 09/03/22  1:00 PM  Result Value Ref Range   Amylase 150 (H) 28 - 100 U/L    Comment: Performed at [facility], [place] [address], [place], [place] [address]  Lipase, blood     Status: None   Collection Time: 09/03/22  1:00 PM  Result Value Ref Range   Lipase 24 11 - 51 U/L    Comment: Performed at [facility] [address], [place], [place] [address]  Protein / creatinine ratio, urine     Status:  Abnormal   Collection Time: 09/03/22  1:02 PM  Result Value Ref Range   Creatinine, Urine 106 mg/dL   Total Protein, Urine 19 mg/dL    Comment: NO NORMAL RANGE ESTABLISHED FOR THIS TEST   Protein Creatinine Ratio 0.18 (H) 0.00 - 0.15 mg/mg[Cre]    Comment: Performed at [facility] [address] [place], [place] [address]  Beta-hydroxybutyric acid     Status: Abnormal   Collection Time: 09/03/22  1:15 PM  Result Value Ref Range   Beta-Hydroxybutyric Acid 0.35 (H) 0.05 - 0.27 mmol/L    Comment: Performed at [facility] [address], [place], [place] [address]  Type and screen [place]     Status: None   Collection Time: 09/03/22  1:15 PM  Result Value Ref  Range   ABO/RH(D) O POS    Antibody Screen NEG    Sample Expiration      09/06/2022,2359 Performed at [facility], [place] [address], [place], [place] [address]   Glucose, capillary     Status: Abnormal   Collection Time: 09/03/22  6:32 PM  Result Value Ref Range   Glucose-Capillary 59 (L) 70 - 99 mg/dL    Comment: Glucose reference range applies only to samples taken after fasting for at least 8 hours.  Glucose, capillary     Status: Abnormal   Collection Time: 09/03/22  7:34 PM  Result Value Ref Range   Glucose-Capillary 168 (H) 70 - 99 mg/dL    Comment: Glucose reference range applies only to samples taken after fasting for at least 8 hours.  Glucose, capillary     Status: Abnormal   Collection Time: 09/03/22 10:01 PM  Result Value Ref Range   Glucose-Capillary 147 (H) 70 - 99 mg/dL    Comment: Glucose reference range applies only to samples taken after fasting for at least 8 hours.  CBC with Differential/Platelet     Status: Abnormal   Collection Time: 09/03/22 11:13 PM  Result Value Ref Range   WBC 12.3 (H) 4.0 - 10.5 K/uL   RBC 4.55 3.87 - 5.11 MIL/uL   Hemoglobin 13.4 12.0 - 15.0 g/dL   HCT 38.7 36.0 - 46.0 %   MCV 85.1 80.0 - 100.0 fL   MCH 29.5 26.0 - 34.0 pg   MCHC 34.6 30.0 - 36.0 g/dL   RDW 13.6 11.5 - 15.5 %   Platelets 303 150 - 400 K/uL   nRBC 0.0 0.0 - 0.2 %   Neutrophils Relative % 68 %   Neutro Abs 8.4 (H) 1.7 - 7.7 K/uL   Lymphocytes Relative 25 %   Lymphs Abs 3.0 0.7 - 4.0 K/uL   Monocytes Relative 6 %   Monocytes Absolute 0.8 0.1 - 1.0 K/uL   Eosinophils Relative 0 %   Eosinophils Absolute 0.0 0.0 - 0.5 K/uL   Basophils Relative 0 %   Basophils Absolute 0.0 0.0 - 0.1 K/uL   Immature Granulocytes 1 %   Abs Immature Granulocytes 0.09 (H) 0.00 - 0.07 K/uL    Comment: Performed at [facility], [address], [place],  [address]  Comprehensive metabolic panel     Status: Abnormal   Collection Time: 09/03/22 11:13 PM  Result Value  Ref Range   Sodium 135 135 - 145 mmol/L   Potassium 3.5 3.5 - 5.1 mmol/L   Chloride 100 98 - 111 mmol/L   CO2 22 22 - 32 mmol/L   Glucose,  Bld 144 (H) 70 - 99 mg/dL    Comment: Glucose reference range applies only to samples taken after fasting for at least 8 hours.   BUN <5 (L) 6 - 20 mg/dL   Creatinine, Ser 0.66 0.44 - 1.00 mg/dL   Calcium 9.0 8.9 - 10.3 mg/dL   Total Protein 7.4 6.5 - 8.1 g/dL   Albumin 3.3 (L) 3.5 - 5.0 g/dL   AST 24 15 - 41 U/L   ALT 14 0 - 44 U/L   Alkaline Phosphatase 98 38 - 126 U/L   Total Bilirubin 0.6 0.3 - 1.2 mg/dL   GFR, Estimated >60 >60 mL/min    Comment: (NOTE) Calculated using the CKD-EPI Creatinine Equation (2021)    Anion gap 13 5 - 15    Comment: Performed at [facility] [address] [place], [place] [address]  Amylase     Status: Abnormal   Collection Time: 09/03/22 11:13 PM  Result Value Ref Range   Amylase 171 (H) 28 - 100 U/L    Comment: Performed at [facility], [place] [address] [place], [place] [address]  Lipase, blood     Status: None   Collection Time: 09/03/22 11:13 PM  Result Value Ref Range   Lipase 23 11 - 51 U/L    Comment: Performed at [facility] [address], [place], [place] [address]  Glucose, capillary     Status: Abnormal   Collection Time: 09/04/22  2:06 AM  Result Value Ref Range   Glucose-Capillary 222 (H) 70 - 99 mg/dL    Comment: Glucose reference range applies only to samples taken after fasting for at least 8 hours.  Glucose, capillary     Status: Abnormal   Collection Time: 09/04/22  6:05 AM  Result Value Ref Range   Glucose-Capillary 259 (H) 70 - 99 mg/dL    Comment: Glucose reference range applies only to samples taken after fasting for at least 8 hours.  Glucose, capillary     Status: Abnormal   Collection Time: 09/04/22  7:34 AM  Result Value Ref Range   Glucose-Capillary 209 (H) 70 - 99 mg/dL    Comment: Glucose reference range applies only to samples taken after fasting for  at least 8 hours.    I have reviewed the patient's current medications.  ASSESSMENT: Principal Problem:   Gallstones Active Problems:   Nausea and vomiting of pregnancy, antepartum   PLAN: Appreciate diabetic counselor assistance. Pt has restarted her insulin pump this AM. Will advance diet slowly as tolerated. Daily NST   Continue routine antenatal care.   Lynnda Shields, MD [place] Attending, Center for [facility] 09/04/2022 10:27 AM

## 2022-09-04 NOTE — Progress Notes (Signed)
CBG 209. Patient had been taken off insulin pump for MRI and mother now has brought equipment to restart patient's insulin pump form home. 1.95 units Humalog insulin given via pump by patient per ordered parameters.
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
## 2022-09-05 ENCOUNTER — Encounter (HOSPITAL_COMMUNITY): Payer: Self-pay | Admitting: Obstetrics and Gynecology

## 2022-09-05 DIAGNOSIS — Z3A29 29 weeks gestation of pregnancy: Secondary | ICD-10-CM

## 2022-09-05 DIAGNOSIS — K8020 Calculus of gallbladder without cholecystitis without obstruction: Secondary | ICD-10-CM | POA: Diagnosis not present

## 2022-09-05 LAB — GLUCOSE, CAPILLARY
Glucose-Capillary: 157 mg/dL — ABNORMAL HIGH (ref 70–99)
Glucose-Capillary: 52 mg/dL — ABNORMAL LOW (ref 70–99)
Glucose-Capillary: 126 mg/dL — ABNORMAL HIGH (ref 70–99)
Glucose-Capillary: 58 mg/dL — ABNORMAL LOW (ref 70–99)
Glucose-Capillary: 205 mg/dL — ABNORMAL HIGH (ref 70–99)
Glucose-Capillary: 148 mg/dL — ABNORMAL HIGH (ref 70–99)

## 2022-09-05 LAB — FETAL FIBRONECTIN: Fetal Fibronectin: NEGATIVE

## 2022-09-05 MED ORDER — POTASSIUM CHLORIDE CRYS ER 20 MEQ PO TBCR: 20.0000 meq | EXTENDED_RELEASE_TABLET | Freq: Two times a day (BID) | ORAL | 0 refills | Status: DC

## 2022-09-05 MED ORDER — NIFEDIPINE 10 MG PO CAPS
10.0000 mg | ORAL_CAPSULE | Freq: Three times a day (TID) | ORAL | Status: DC | PRN
  Administered 2022-09-05: 10 mg via ORAL
  Filled 2022-09-05: qty 1

## 2022-09-05 MED ORDER — SODIUM CHLORIDE 0.9 % IV BOLUS
500.0000 mL | Freq: Once | INTRAVENOUS | Status: AC
  Administered 2022-09-05: 500 mL via INTRAVENOUS

## 2022-09-05 MED ORDER — INDOMETHACIN 25 MG PO CAPS
50.0000 mg | ORAL_CAPSULE | Freq: Once | ORAL | Status: AC
  Administered 2022-09-05: 50 mg via ORAL
  Filled 2022-09-05: qty 2

## 2022-09-05 MED ORDER — NIFEDIPINE 10 MG PO CAPS: 10.0000 mg | ORAL_CAPSULE | Freq: Three times a day (TID) | ORAL | Status: DC

## 2022-09-05 MED ORDER — POTASSIUM CHLORIDE CRYS ER 20 MEQ PO TBCR
20.0000 meq | EXTENDED_RELEASE_TABLET | Freq: Two times a day (BID) | ORAL | Status: DC
  Administered 2022-09-05: 20 meq via ORAL
  Filled 2022-09-05: qty 1

## 2022-09-05 NOTE — Progress Notes (Signed)
[facility] NOTE  Sabrina Sutton is a 27 y.o. G1P0 at 41w5dwho is admitted for nausea and vomiting.  Estimated Date of Delivery: 11/16/22 Fetal presentation is unsure.  Length of Stay:  1 Days. Admitted 09/03/2022  Subjective: Pt seen this AM.  Yesterday, the patient was having difficulty keeping down water.  Today the patient is enjoying an icee.  She has tolerated most beverages and has had a few bites of a sandwich. Patient reports normal fetal movement.  She denies uterine contractions, denies bleeding and leaking of fluid per vagina.  Vitals:  Blood pressure 111/65, pulse (!) 101, temperature 97.6 F (36.4 C), temperature source Oral, resp. rate 16, weight 55.3 kg, last menstrual period 02/09/2022, SpO2 99 %. Physical Examination: CONSTITUTIONAL: Well-developed, well-nourished female in no acute distress.  HENT:  Normocephalic, atraumatic, External right and left ear normal. Oropharynx is clear and moist EYES: Conjunctivae and EOM are normal. NECK: Normal range of motion, supple, no masses. SKIN: Skin is warm and dry. No rash noted. Not diaphoretic. No erythema. No pallor. [place] and oriented to person, place, and time. Normal reflexes, muscle tone coordination. No cranial nerve deficit noted. PSYCHIATRIC: Normal mood and affect. Normal behavior. Normal judgment and thought content. CARDIOVASCULAR: Normal heart rate noted, regular rhythm RESPIRATORY: Effort and breath sounds normal, no problems with respiration noted MUSCULOSKELETAL: Normal range of motion. No edema and no tenderness. ABDOMEN: Soft, nontender, nondistended, gravid. CERVIX: deferred  Fetal monitoring: FHR: 140s bpm, Variability: moderate, Accelerations: Present, Decelerations: Absent  Uterine activity: none  Results for orders placed or performed during the hospital encounter of 09/03/22 (from the past 48 hour(s))  Glucose, capillary     Status: None   Collection Time: 09/03/22  12:53 PM  Result Value Ref Range   Glucose-Capillary 80 70 - 99 mg/dL    Comment: Glucose reference range applies only to samples taken after fasting for at least 8 hours.  CBC with Differential/Platelet     Status: Abnormal   Collection Time: 09/03/22  1:00 PM  Result Value Ref Range   WBC 9.8 4.0 - 10.5 K/uL   RBC 3.87 3.87 - 5.11 MIL/uL   Hemoglobin 11.6 (L) 12.0 - 15.0 g/dL   HCT 32.5 (L) 36.0 - 46.0 %   MCV 84.0 80.0 - 100.0 fL   MCH 30.0 26.0 - 34.0 pg   MCHC 35.7 30.0 - 36.0 g/dL   RDW 13.5 11.5 - 15.5 %   Platelets 315 150 - 400 K/uL   nRBC 0.0 0.0 - 0.2 %   Neutrophils Relative % 71 %   Neutro Abs 6.9 1.7 - 7.7 K/uL   Lymphocytes Relative 22 %   Lymphs Abs 2.2 0.7 - 4.0 K/uL   Monocytes Relative 6 %   Monocytes Absolute 0.6 0.1 - 1.0 K/uL   Eosinophils Relative 0 %   Eosinophils Absolute 0.0 0.0 - 0.5 K/uL   Basophils Relative 0 %   Basophils Absolute 0.0 0.0 - 0.1 K/uL   Immature Granulocytes 1 %   Abs Immature Granulocytes 0.13 (H) 0.00 - 0.07 K/uL    Comment: Performed at [facility] [address] [place] [id number] Comprehensive metabolic panel     Status: Abnormal   Collection Time: 09/03/22  1:00 PM  Result Value Ref Range   Sodium 134 (L) 135 - 145 mmol/L   Potassium 3.1 (L) 3.5 - 5.1 mmol/L   Chloride 100 98 - 111 mmol/L   CO2 21 (  L) 22 - 32 mmol/L   Glucose, Bld 92 70 - 99 mg/dL    Comment: Glucose reference range applies only to samples taken after fasting for at least 8 hours.   BUN <5 (L) 6 - 20 mg/dL   Creatinine, Ser 0.48 0.44 - 1.00 mg/dL   Calcium 8.7 (L) 8.9 - 10.3 mg/dL   Total Protein 6.6 6.5 - 8.1 g/dL   Albumin 2.9 (L) 3.5 - 5.0 g/dL   AST 19 15 - 41 U/L   ALT 13 0 - 44 U/L   Alkaline Phosphatase 93 38 - 126 U/L   Total Bilirubin 0.1 (L) 0.3 - 1.2 mg/dL   GFR, Estimated >60 >60 mL/min    Comment: (NOTE) Calculated using the CKD-EPI Creatinine Equation (2021)    Anion gap 13 5 - 15    Comment: Performed at [facility], [place] [address] [place], [place] [address]  Amylase     Status: Abnormal   Collection Time: 09/03/22  1:00 PM  Result Value Ref Range   Amylase 150 (H) 28 - 100 U/L    Comment: Performed at [facility], [place] [address], [place], [place] [address]  Lipase, blood     Status: None   Collection Time: 09/03/22  1:00 PM  Result Value Ref Range   Lipase 24 11 - 51 U/L    Comment: Performed at [facility] [address], [place], [place] [address]  Protein / creatinine ratio, urine     Status: Abnormal   Collection Time: 09/03/22  1:02 PM  Result Value Ref Range   Creatinine, Urine 106 mg/dL   Total Protein, Urine 19 mg/dL    Comment: NO NORMAL RANGE ESTABLISHED FOR THIS TEST   Protein Creatinine Ratio 0.18 (H) 0.00 - 0.15 mg/mg[Cre]    Comment: Performed at [facility] [address] [place], [place] [address]  Beta-hydroxybutyric acid     Status: Abnormal   Collection Time: 09/03/22  1:15 PM  Result Value Ref Range   Beta-Hydroxybutyric Acid 0.35 (H) 0.05 - 0.27 mmol/L    Comment: Performed at [facility] [address], [place], [place] [address]  Type and screen [place]     Status: None   Collection Time: 09/03/22  1:15 PM  Result Value Ref Range   ABO/RH(D) O POS    Antibody Screen NEG    Sample Expiration      09/06/2022,2359 Performed at [facility], [place] [address] [place], [place] [address]   Glucose, capillary     Status: Abnormal   Collection Time: 09/03/22  6:32 PM  Result Value Ref Range   Glucose-Capillary 59 (L) 70 - 99 mg/dL    Comment: Glucose reference range applies only to samples taken after fasting for at least 8 hours.  Glucose, capillary     Status: Abnormal   Collection Time: 09/03/22  7:34 PM  Result Value Ref Range   Glucose-Capillary 168 (H) 70 - 99 mg/dL    Comment: Glucose reference range applies only to samples taken after fasting for at least 8 hours.  Glucose, capillary     Status:  Abnormal   Collection Time: 09/03/22 10:01 PM  Result Value Ref Range   Glucose-Capillary 147 (H) 70 - 99 mg/dL    Comment: Glucose reference range applies only to samples taken after fasting for at least 8 hours.  CBC with Differential/Platelet  Status: Abnormal   Collection Time: 09/03/22 11:13 PM  Result Value Ref Range   WBC 12.3 (H) 4.0 - 10.5 K/uL   RBC 4.55 3.87 - 5.11 MIL/uL   Hemoglobin 13.4 12.0 - 15.0 g/dL   HCT 38.7 36.0 - 46.0 %   MCV 85.1 80.0 - 100.0 fL   MCH 29.5 26.0 - 34.0 pg   MCHC 34.6 30.0 - 36.0 g/dL   RDW 13.6 11.5 - 15.5 %   Platelets 303 150 - 400 K/uL   nRBC 0.0 0.0 - 0.2 %   Neutrophils Relative % 68 %   Neutro Abs 8.4 (H) 1.7 - 7.7 K/uL   Lymphocytes Relative 25 %   Lymphs Abs 3.0 0.7 - 4.0 K/uL   Monocytes Relative 6 %   Monocytes Absolute 0.8 0.1 - 1.0 K/uL   Eosinophils Relative 0 %   Eosinophils Absolute 0.0 0.0 - 0.5 K/uL   Basophils Relative 0 %   Basophils Absolute 0.0 0.0 - 0.1 K/uL   Immature Granulocytes 1 %   Abs Immature Granulocytes 0.09 (H) 0.00 - 0.07 K/uL    Comment: Performed at [facility], [address] [place], [place] [address]  Comprehensive metabolic panel     Status: Abnormal   Collection Time: 09/03/22 11:13 PM  Result Value Ref Range   Sodium 135 135 - 145 mmol/L   Potassium 3.5 3.5 - 5.1 mmol/L   Chloride 100 98 - 111 mmol/L   CO2 22 22 - 32 mmol/L   Glucose, Bld 144 (H) 70 - 99 mg/dL    Comment: Glucose reference range applies only to samples taken after fasting for at least 8 hours.   BUN <5 (L) 6 - 20 mg/dL   Creatinine, Ser 0.66 0.44 - 1.00 mg/dL   Calcium 9.0 8.9 - 10.3 mg/dL   Total Protein 7.4 6.5 - 8.1 g/dL   Albumin 3.3 (L) 3.5 - 5.0 g/dL   AST 24 15 - 41 U/L   ALT 14 0 - 44 U/L   Alkaline Phosphatase 98 38 - 126 U/L   Total Bilirubin 0.6 0.3 - 1.2 mg/dL   GFR, Estimated >60 >60 mL/min    Comment: (NOTE) Calculated using the CKD-EPI Creatinine Equation (2021)    Anion gap 13 5 - 15     Comment: Performed at [facility] [address], [place], [place] [address]  Amylase     Status: Abnormal   Collection Time: 09/03/22 11:13 PM  Result Value Ref Range   Amylase 171 (H) 28 - 100 U/L    Comment: Performed at [facility], [place] [address], [place], [place] [address]  Lipase, blood     Status: None   Collection Time: 09/03/22 11:13 PM  Result Value Ref Range   Lipase 23 11 - 51 U/L    Comment: Performed at [facility] [address], [place], [place] [address]  Glucose, capillary     Status: Abnormal   Collection Time: 09/04/22  2:06 AM  Result Value Ref Range   Glucose-Capillary 222 (H) 70 - 99 mg/dL    Comment: Glucose reference range applies only to samples taken after fasting for at least 8 hours.  Glucose, capillary     Status: Abnormal   Collection Time: 09/04/22  6:05 AM  Result Value Ref Range   Glucose-Capillary 259 (H) 70 - 99 mg/dL    Comment: Glucose reference range applies only to samples taken after fasting  for at least 8 hours.  Glucose, capillary     Status: Abnormal   Collection Time: 09/04/22  7:34 AM  Result Value Ref Range   Glucose-Capillary 209 (H) 70 - 99 mg/dL    Comment: Glucose reference range applies only to samples taken after fasting for at least 8 hours.  Comprehensive metabolic panel     Status: Abnormal   Collection Time: 09/04/22 10:31 AM  Result Value Ref Range   Sodium 133 (L) 135 - 145 mmol/L   Potassium 3.2 (L) 3.5 - 5.1 mmol/L   Chloride 101 98 - 111 mmol/L   CO2 19 (L) 22 - 32 mmol/L   Glucose, Bld 160 (H) 70 - 99 mg/dL    Comment: Glucose reference range applies only to samples taken after fasting for at least 8 hours.   BUN 6 6 - 20 mg/dL   Creatinine, Ser 0.79 0.44 - 1.00 mg/dL   Calcium 8.5 (L) 8.9 - 10.3 mg/dL   Total Protein 6.2 (L) 6.5 - 8.1 g/dL   Albumin 2.9 (L) 3.5 - 5.0 g/dL   AST 30 15 - 41 U/L   ALT 15 0 - 44 U/L   Alkaline Phosphatase 85 38 - 126 U/L   Total Bilirubin 0.4 0.3 - 1.2 mg/dL    GFR, Estimated >60 >60 mL/min    Comment: (NOTE) Calculated using the CKD-EPI Creatinine Equation (2021)    Anion gap 13 5 - 15    Comment: Performed at [facility], [place] [address], [place], [place] [address]  Glucose, capillary     Status: Abnormal   Collection Time: 09/04/22 10:11 PM  Result Value Ref Range   Glucose-Capillary 63 (L) 70 - 99 mg/dL    Comment: Glucose reference range applies only to samples taken after fasting for at least 8 hours.   Comment 1 Notify RN   Glucose, capillary     Status: Abnormal   Collection Time: 09/04/22 10:36 PM  Result Value Ref Range   Glucose-Capillary 63 (L) 70 - 99 mg/dL    Comment: Glucose reference range applies only to samples taken after fasting for at least 8 hours.  Glucose, capillary     Status: None   Collection Time: 09/04/22 10:54 PM  Result Value Ref Range   Glucose-Capillary 76 70 - 99 mg/dL    Comment: Glucose reference range applies only to samples taken after fasting for at least 8 hours.  Glucose, capillary     Status: Abnormal   Collection Time: 09/05/22  1:59 AM  Result Value Ref Range   Glucose-Capillary 157 (H) 70 - 99 mg/dL    Comment: Glucose reference range applies only to samples taken after fasting for at least 8 hours.   Comment 1 Notify RN   Glucose, capillary     Status: Abnormal   Collection Time: 09/05/22  9:09 AM  Result Value Ref Range   Glucose-Capillary 52 (L) 70 - 99 mg/dL    Comment: Glucose reference range applies only to samples taken after fasting for at least 8 hours.  Glucose, capillary     Status: Abnormal   Collection Time: 09/05/22  9:38 AM  Result Value Ref Range   Glucose-Capillary 58 (L) 70 - 99 mg/dL    Comment: Glucose reference range applies only to samples taken after fasting for at least 8 hours.  Glucose, capillary     Status: Abnormal   Collection Time: 09/05/22 10:21 AM  Result Value Ref Range   Glucose-Capillary  126 (H) 70 - 99 mg/dL    Comment: Glucose reference range  applies only to samples taken after fasting for at least 8 hours.    I have reviewed the patient's current medications.  ASSESSMENT: Principal Problem:   Gallstones Active Problems:   Nausea and vomiting of pregnancy, antepartum   Nausea and vomiting during pregnancy   PLAN: Oral intake has improved. Discussed patient briefly with diabetic coordinator regarding hypoglycemic episodes.  Coordinator recommends decreasing basal rate at night. Will reassess oral intake this afternoon.  It is improved on reglan.  If pt is stable consider d/c home this PM or tomorrow morning   Continue routine antenatal care.   Lynnda Shields, MD [place] Attending, [facility] 09/05/2022 10:28 AM

## 2022-09-05 NOTE — Progress Notes (Signed)
Patient states she is no longer feeling contractions. Contractions noted on fetal monitor. Dr. Elgie Congo aware and has reviewed fetal monitoring strip.

## 2022-09-05 NOTE — Discharge Summary (Signed)
Antenatal Physician Discharge Summary  Patient ID: Sabrina Sutton MRN: [id number] DOB/AGE: 11/07/1995 27 y.o.  Admit date: 09/03/2022 Discharge date: 09/05/2022  Admission Diagnoses:  Discharge Diagnoses:   Prenatal Procedures: {prenatal procedures:[id number]}  Consults: Neonatology, Maternal Fetal Medicine  Hospital Course:  Sabrina Sutton is a 27 y.o. G1P0 with IUP at 89w5dadmitted for ***.  She was admitted with contractions, noted to have a cervical exam of ***.  No leaking of fluid and no bleeding.  She was initially started on magnesium sulfate for tocolysis and neuroprotection and also received betamethasone x 2 doses.  Her tocolysis was transitioned to Procardia. She was seen by Neonatology during her stay.  She was observed, fetal heart rate monitoring remained reassuring, and she had no signs/symptoms of progressing preterm labor or other maternal-fetal concerns.  Her cervical exam was unchanged from admission.  She was deemed stable for discharge to home with outpatient follow up.  Discharge Exam: Temp:  [97.6 F (36.4 C)-98.1 F (36.7 C)] 97.7 F (36.5 C) (02/13 1726) Pulse Rate:  [95-114] 110 (02/13 1726) Resp:  [15-19] 18 (02/13 1726) BP: (109-134)/(55-86) 114/69 (02/13 1726) SpO2:  [99 %-100 %] 100 % (02/13 1726) Physical Examination: CONSTITUTIONAL: Well-developed, well-nourished female in no acute distress.  HENT:  Normocephalic, atraumatic, External right and left ear normal. Oropharynx is clear and moist EYES: Conjunctivae and EOM are normal. Pupils are equal, round, and reactive to light. No scleral icterus.  NECK: Normal range of motion, supple, no masses SKIN: Skin is warm and dry. No rash noted. Not diaphoretic. No erythema. No pallor. [place] Alert and oriented to person, place, and time. Normal reflexes, muscle tone coordination. No cranial nerve deficit noted. PSYCHIATRIC: Normal mood and affect. Normal behavior. Normal judgment and thought  content. CARDIOVASCULAR: Normal heart rate noted, regular rhythm RESPIRATORY: Effort and breath sounds normal, no problems with respiration noted MUSCULOSKELETAL: Normal range of motion. No edema and no tenderness. 2+ distal pulses. ABDOMEN: Soft, nontender, nondistended, gravid. CERVIX: Dilation: Closed Exam by:: Dr. BElgie Congo Significant Diagnostic Studies:  Results for orders placed or performed during the hospital encounter of 09/03/22 (from the past 168 hour(s))  Glucose, capillary   Collection Time: 09/03/22 12:53 PM  Result Value Ref Range   Glucose-Capillary 80 70 - 99 mg/dL  CBC with Differential/Platelet   Collection Time: 09/03/22  1:00 PM  Result Value Ref Range   WBC 9.8 4.0 - 10.5 K/uL   RBC 3.87 3.87 - 5.11 MIL/uL   Hemoglobin 11.6 (L) 12.0 - 15.0 g/dL   HCT 32.5 (L) 36.0 - 46.0 %   MCV 84.0 80.0 - 100.0 fL   MCH 30.0 26.0 - 34.0 pg   MCHC 35.7 30.0 - 36.0 g/dL   RDW 13.5 11.5 - 15.5 %   Platelets 315 150 - 400 K/uL   nRBC 0.0 0.0 - 0.2 %   Neutrophils Relative % 71 %   Neutro Abs 6.9 1.7 - 7.7 K/uL   Lymphocytes Relative 22 %   Lymphs Abs 2.2 0.7 - 4.0 K/uL   Monocytes Relative 6 %   Monocytes Absolute 0.6 0.1 - 1.0 K/uL   Eosinophils Relative 0 %   Eosinophils Absolute 0.0 0.0 - 0.5 K/uL   Basophils Relative 0 %   Basophils Absolute 0.0 0.0 - 0.1 K/uL   Immature Granulocytes 1 %   Abs Immature Granulocytes 0.13 (H) 0.00 - 0.07 K/uL  Comprehensive metabolic panel   Collection Time: 09/03/22  1:00 PM  Result Value Ref Range  Sodium 134 (L) 135 - 145 mmol/L   Potassium 3.1 (L) 3.5 - 5.1 mmol/L   Chloride 100 98 - 111 mmol/L   CO2 21 (L) 22 - 32 mmol/L   Glucose, Bld 92 70 - 99 mg/dL   BUN <5 (L) 6 - 20 mg/dL   Creatinine, Ser 0.48 0.44 - 1.00 mg/dL   Calcium 8.7 (L) 8.9 - 10.3 mg/dL   Total Protein 6.6 6.5 - 8.1 g/dL   Albumin 2.9 (L) 3.5 - 5.0 g/dL   AST 19 15 - 41 U/L   ALT 13 0 - 44 U/L   Alkaline Phosphatase 93 38 - 126 U/L   Total Bilirubin 0.1  (L) 0.3 - 1.2 mg/dL   GFR, Estimated >60 >60 mL/min   Anion gap 13 5 - 15  Amylase   Collection Time: 09/03/22  1:00 PM  Result Value Ref Range   Amylase 150 (H) 28 - 100 U/L  Lipase, blood   Collection Time: 09/03/22  1:00 PM  Result Value Ref Range   Lipase 24 11 - 51 U/L  Protein / creatinine ratio, urine   Collection Time: 09/03/22  1:02 PM  Result Value Ref Range   Creatinine, Urine 106 mg/dL   Total Protein, Urine 19 mg/dL   Protein Creatinine Ratio 0.18 (H) 0.00 - 0.15 mg/mg[Cre]  Beta-hydroxybutyric acid   Collection Time: 09/03/22  1:15 PM  Result Value Ref Range   Beta-Hydroxybutyric Acid 0.35 (H) 0.05 - 0.27 mmol/L  Type and screen [place]   Collection Time: 09/03/22  1:15 PM  Result Value Ref Range   ABO/RH(D) O POS    Antibody Screen NEG    Sample Expiration      09/06/2022,2359 Performed at [facility], [place] [address], [place], [place] [address]   Glucose, capillary   Collection Time: 09/03/22  6:32 PM  Result Value Ref Range   Glucose-Capillary 59 (L) 70 - 99 mg/dL  Glucose, capillary   Collection Time: 09/03/22  7:34 PM  Result Value Ref Range   Glucose-Capillary 168 (H) 70 - 99 mg/dL  Glucose, capillary   Collection Time: 09/03/22 10:01 PM  Result Value Ref Range   Glucose-Capillary 147 (H) 70 - 99 mg/dL  CBC with Differential/Platelet   Collection Time: 09/03/22 11:13 PM  Result Value Ref Range   WBC 12.3 (H) 4.0 - 10.5 K/uL   RBC 4.55 3.87 - 5.11 MIL/uL   Hemoglobin 13.4 12.0 - 15.0 g/dL   HCT 38.7 36.0 - 46.0 %   MCV 85.1 80.0 - 100.0 fL   MCH 29.5 26.0 - 34.0 pg   MCHC 34.6 30.0 - 36.0 g/dL   RDW 13.6 11.5 - 15.5 %   Platelets 303 150 - 400 K/uL   nRBC 0.0 0.0 - 0.2 %   Neutrophils Relative % 68 %   Neutro Abs 8.4 (H) 1.7 - 7.7 K/uL   Lymphocytes Relative 25 %   Lymphs Abs 3.0 0.7 - 4.0 K/uL   Monocytes Relative 6 %   Monocytes Absolute 0.8 0.1 - 1.0 K/uL   Eosinophils Relative 0 %   Eosinophils Absolute  0.0 0.0 - 0.5 K/uL   Basophils Relative 0 %   Basophils Absolute 0.0 0.0 - 0.1 K/uL   Immature Granulocytes 1 %   Abs Immature Granulocytes 0.09 (H) 0.00 - 0.07 K/uL  Comprehensive metabolic panel   Collection Time: 09/03/22 11:13 PM  Result Value Ref Range   Sodium 135 135 -  145 mmol/L   Potassium 3.5 3.5 - 5.1 mmol/L   Chloride 100 98 - 111 mmol/L   CO2 22 22 - 32 mmol/L   Glucose, Bld 144 (H) 70 - 99 mg/dL   BUN <5 (L) 6 - 20 mg/dL   Creatinine, Ser 0.66 0.44 - 1.00 mg/dL   Calcium 9.0 8.9 - 10.3 mg/dL   Total Protein 7.4 6.5 - 8.1 g/dL   Albumin 3.3 (L) 3.5 - 5.0 g/dL   AST 24 15 - 41 U/L   ALT 14 0 - 44 U/L   Alkaline Phosphatase 98 38 - 126 U/L   Total Bilirubin 0.6 0.3 - 1.2 mg/dL   GFR, Estimated >60 >60 mL/min   Anion gap 13 5 - 15  Amylase   Collection Time: 09/03/22 11:13 PM  Result Value Ref Range   Amylase 171 (H) 28 - 100 U/L  Lipase, blood   Collection Time: 09/03/22 11:13 PM  Result Value Ref Range   Lipase 23 11 - 51 U/L  Glucose, capillary   Collection Time: 09/04/22  2:06 AM  Result Value Ref Range   Glucose-Capillary 222 (H) 70 - 99 mg/dL  Glucose, capillary   Collection Time: 09/04/22  6:05 AM  Result Value Ref Range   Glucose-Capillary 259 (H) 70 - 99 mg/dL  Glucose, capillary   Collection Time: 09/04/22  7:34 AM  Result Value Ref Range   Glucose-Capillary 209 (H) 70 - 99 mg/dL  Comprehensive metabolic panel   Collection Time: 09/04/22 10:31 AM  Result Value Ref Range   Sodium 133 (L) 135 - 145 mmol/L   Potassium 3.2 (L) 3.5 - 5.1 mmol/L   Chloride 101 98 - 111 mmol/L   CO2 19 (L) 22 - 32 mmol/L   Glucose, Bld 160 (H) 70 - 99 mg/dL   BUN 6 6 - 20 mg/dL   Creatinine, Ser 0.79 0.44 - 1.00 mg/dL   Calcium 8.5 (L) 8.9 - 10.3 mg/dL   Total Protein 6.2 (L) 6.5 - 8.1 g/dL   Albumin 2.9 (L) 3.5 - 5.0 g/dL   AST 30 15 - 41 U/L   ALT 15 0 - 44 U/L   Alkaline Phosphatase 85 38 - 126 U/L   Total Bilirubin 0.4 0.3 - 1.2 mg/dL   GFR, Estimated >60  >60 mL/min   Anion gap 13 5 - 15  Glucose, capillary   Collection Time: 09/04/22 10:11 PM  Result Value Ref Range   Glucose-Capillary 63 (L) 70 - 99 mg/dL   Comment 1 Notify RN   Glucose, capillary   Collection Time: 09/04/22 10:36 PM  Result Value Ref Range   Glucose-Capillary 63 (L) 70 - 99 mg/dL  Glucose, capillary   Collection Time: 09/04/22 10:54 PM  Result Value Ref Range   Glucose-Capillary 76 70 - 99 mg/dL  Glucose, capillary   Collection Time: 09/05/22  1:59 AM  Result Value Ref Range   Glucose-Capillary 157 (H) 70 - 99 mg/dL   Comment 1 Notify RN   Glucose, capillary   Collection Time: 09/05/22  9:09 AM  Result Value Ref Range   Glucose-Capillary 52 (L) 70 - 99 mg/dL  Glucose, capillary   Collection Time: 09/05/22  9:38 AM  Result Value Ref Range   Glucose-Capillary 58 (L) 70 - 99 mg/dL  Glucose, capillary   Collection Time: 09/05/22 10:21 AM  Result Value Ref Range   Glucose-Capillary 126 (H) 70 - 99 mg/dL  Glucose, capillary   Collection Time: 09/05/22 12:36 PM  Result Value Ref Range   Glucose-Capillary 205 (H) 70 - 99 mg/dL  Fetal fibronectin   Collection Time: 09/05/22  4:22 PM  Result Value Ref Range   Fetal Fibronectin NEGATIVE NEGATIVE  Glucose, capillary   Collection Time: 09/05/22  5:24 PM  Result Value Ref Range   Glucose-Capillary 148 (H) 70 - 99 mg/dL  Results for orders placed or performed during the hospital encounter of 08/31/22 (from the past 168 hour(s))  Comprehensive metabolic panel   Collection Time: 08/31/22  9:27 AM  Result Value Ref Range   Sodium 133 (L) 135 - 145 mmol/L   Potassium 3.5 3.5 - 5.1 mmol/L   Chloride 95 (L) 98 - 111 mmol/L   CO2 20 (L) 22 - 32 mmol/L   Glucose, Bld 161 (H) 70 - 99 mg/dL   BUN 12 6 - 20 mg/dL   Creatinine, Ser 0.63 0.44 - 1.00 mg/dL   Calcium 9.4 8.9 - 10.3 mg/dL   Total Protein 7.7 6.5 - 8.1 g/dL   Albumin 3.5 3.5 - 5.0 g/dL   AST 23 15 - 41 U/L   ALT 15 0 - 44 U/L   Alkaline Phosphatase 96  38 - 126 U/L   Total Bilirubin 0.6 0.3 - 1.2 mg/dL   GFR, Estimated >60 >60 mL/min   Anion gap 18 (H) 5 - 15  CBC   Collection Time: 08/31/22  9:27 AM  Result Value Ref Range   WBC 11.2 (H) 4.0 - 10.5 K/uL   RBC 4.41 3.87 - 5.11 MIL/uL   Hemoglobin 12.7 12.0 - 15.0 g/dL   HCT 37.8 36.0 - 46.0 %   MCV 85.7 80.0 - 100.0 fL   MCH 28.8 26.0 - 34.0 pg   MCHC 33.6 30.0 - 36.0 g/dL   RDW 13.4 11.5 - 15.5 %   Platelets 387 150 - 400 K/uL   nRBC 0.0 0.0 - 0.2 %  Urinalysis, Routine w reflex microscopic -Urine, Clean Catch   Collection Time: 08/31/22  9:27 AM  Result Value Ref Range   Color, Urine YELLOW YELLOW   APPearance HAZY (A) CLEAR   Specific Gravity, Urine 1.026 1.005 - 1.030   pH 5.0 5.0 - 8.0   Glucose, UA 50 (A) NEGATIVE mg/dL   Hgb urine dipstick NEGATIVE NEGATIVE   Bilirubin Urine NEGATIVE NEGATIVE   Ketones, ur 80 (A) NEGATIVE mg/dL   Protein, ur 100 (A) NEGATIVE mg/dL   Nitrite NEGATIVE NEGATIVE   Leukocytes,Ua NEGATIVE NEGATIVE   RBC / HPF 0-5 0 - 5 RBC/hpf   WBC, UA 0-5 0 - 5 WBC/hpf   Bacteria, UA RARE (A) NONE SEEN   Squamous Epithelial / HPF 0-5 0 - 5 /HPF   Mucus PRESENT   Glucose, capillary   Collection Time: 08/31/22  9:46 AM  Result Value Ref Range   Glucose-Capillary 148 (H) 70 - 99 mg/dL  Beta-hydroxybutyric acid   Collection Time: 08/31/22 10:17 AM  Result Value Ref Range   Beta-Hydroxybutyric Acid 3.34 (H) 0.05 - 0.27 mmol/L  Type and screen [place]   Collection Time: 08/31/22 10:17 AM  Result Value Ref Range   ABO/RH(D) O POS    Antibody Screen NEG    Sample Expiration      09/03/2022,2359 Performed at [facility], [address] [place], [place] [address]   Glucose, capillary   Collection Time: 08/31/22 12:46 PM  Result Value Ref Range   Glucose-Capillary 132 (H) 70 - 99 mg/dL  Glucose, capillary   Collection Time: 08/31/22 10:06 PM  Result Value Ref Range   Glucose-Capillary 77 70 - 99 mg/dL  TSH    Collection Time: 09/01/22  5:47 AM  Result Value Ref Range   TSH 0.960 0.350 - 4.500 uIU/mL  Comprehensive metabolic panel   Collection Time: 09/01/22  5:47 AM  Result Value Ref Range   Sodium 132 (L) 135 - 145 mmol/L   Potassium 2.9 (L) 3.5 - 5.1 mmol/L   Chloride 100 98 - 111 mmol/L   CO2 23 22 - 32 mmol/L   Glucose, Bld 79 70 - 99 mg/dL   BUN 5 (L) 6 - 20 mg/dL   Creatinine, Ser 0.50 0.44 - 1.00 mg/dL   Calcium 8.1 (L) 8.9 - 10.3 mg/dL   Total Protein 4.9 (L) 6.5 - 8.1 g/dL   Albumin 2.2 (L) 3.5 - 5.0 g/dL   AST 14 (L) 15 - 41 U/L   ALT 13 0 - 44 U/L   Alkaline Phosphatase 58 38 - 126 U/L   Total Bilirubin 0.3 0.3 - 1.2 mg/dL   GFR, Estimated >60 >60 mL/min   Anion gap 9 5 - 15  Beta-hydroxybutyric acid   Collection Time: 09/01/22  5:47 AM  Result Value Ref Range   Beta-Hydroxybutyric Acid 0.35 (H) 0.05 - 0.27 mmol/L   MR ABDOMEN WO CONTRAST  Result Date: 09/04/2022 CLINICAL DATA:  Abdominal pain in pregnancy. EXAM: MRI ABDOMEN AND PELVIS WITHOUT CONTRAST TECHNIQUE: Multiplanar multisequence MR imaging of the abdomen and pelvis was performed. No intravenous contrast was administered. COMPARISON:  Right upper quadrant sonogram 09/03/2022 in CT [facility] 11/24/2018 FINDINGS: COMBINED FINDINGS FOR BOTH MR ABDOMEN AND PELVIS Lower chest: Trace bilateral pleural effusions. Hepatobiliary: No mass or other parenchymal abnormality identified. Gallbladder is visualized and appears normal. Pancreas: No mass, inflammatory changes, or other parenchymal abnormality identified. Spleen:  Within normal limits in size and appearance. Adrenals/Urinary Tract: Normal adrenal glands. Normal appearance of the left kidney. Moderate right hydronephrosis. Moderate right hydroureter to the level of the aortic bifurcation where the to ureter appears compressed between the right psoas muscle and gravid uterus. The urinary bladder is unremarkable. Stomach/Bowel: Stomach appears normal. Large gravid uterus displaces the  bowel loops into the upward and laterally within the abdomen. No dilated bowel loops identified. The appendix is not confidently identified. No pathologic dilatation of the large or small bowel loops. No bowel wall thickening or inflammation noted. Vascular/Lymphatic: Normal appearance of the abdominal aorta. No abdominopelvic adenopathy identified. Reproductive: Enlarged gravid uterus.  No adnexal mass identified. Other: Small volume of free fluid noted within the abdomen and pelvis. Musculoskeletal: No suspicious bone lesions identified. Mild subcutaneous edema identified within the body wall. IMPRESSION: 1. No acute findings identified within the abdomen or pelvis. 2. The appendix is not confidently identified. 3. Moderate right hydronephrosis and hydroureter to the level of the aortic bifurcation where the ureter appears compressed between the right psoas muscle and gravid uterus. 4. Small volume of free fluid noted within the abdomen and pelvis. 5. Trace bilateral pleural effusions. Electronically Signed   By: Kerby Moors M.D.   On: 09/04/2022 04:33   MR PELVIS WO CONTRAST  Result Date: 09/04/2022 CLINICAL DATA:  Abdominal pain in pregnancy. EXAM: MRI ABDOMEN AND PELVIS WITHOUT CONTRAST TECHNIQUE: Multiplanar multisequence MR imaging of the abdomen and pelvis was performed. No intravenous contrast was administered. COMPARISON:  Right upper quadrant sonogram 09/03/2022 in CT [facility] 11/24/2018 FINDINGS: COMBINED FINDINGS FOR BOTH MR ABDOMEN AND  PELVIS Lower chest: Trace bilateral pleural effusions. Hepatobiliary: No mass or other parenchymal abnormality identified. Gallbladder is visualized and appears normal. Pancreas: No mass, inflammatory changes, or other parenchymal abnormality identified. Spleen:  Within normal limits in size and appearance. Adrenals/Urinary Tract: Normal adrenal glands. Normal appearance of the left kidney. Moderate right hydronephrosis. Moderate right hydroureter to the level of the  aortic bifurcation where the to ureter appears compressed between the right psoas muscle and gravid uterus. The urinary bladder is unremarkable. Stomach/Bowel: Stomach appears normal. Large gravid uterus displaces the bowel loops into the upward and laterally within the abdomen. No dilated bowel loops identified. The appendix is not confidently identified. No pathologic dilatation of the large or small bowel loops. No bowel wall thickening or inflammation noted. Vascular/Lymphatic: Normal appearance of the abdominal aorta. No abdominopelvic adenopathy identified. Reproductive: Enlarged gravid uterus.  No adnexal mass identified. Other: Small volume of free fluid noted within the abdomen and pelvis. Musculoskeletal: No suspicious bone lesions identified. Mild subcutaneous edema identified within the body wall. IMPRESSION: 1. No acute findings identified within the abdomen or pelvis. 2. The appendix is not confidently identified. 3. Moderate right hydronephrosis and hydroureter to the level of the aortic bifurcation where the ureter appears compressed between the right psoas muscle and gravid uterus. 4. Small volume of free fluid noted within the abdomen and pelvis. 5. Trace bilateral pleural effusions. Electronically Signed   By: Kerby Moors M.D.   On: 09/04/2022 04:33   US ABDOMEN LIMITED RUQ (LIVER/GB)  Addendum Date: 09/03/2022   ADDENDUM REPORT: 09/03/2022 18:43 ADDENDUM: There is a voice recognition error within the impression of the regional report. Impression point #1 should read, "Small stone in the gallbladder. NO sonographic evidence of acute cholecystitis." No additional changes. Updated report was discussed with Dr. Bobbye Morton by telephone at 6:40 p.m. on 09/03/2022 by Dr. Davina Poke. Electronically Signed   By: Davina Poke D.O.   On: 09/03/2022 18:43   Result Date: 09/03/2022 CLINICAL DATA:  Nausea and vomiting EXAM: ULTRASOUND ABDOMEN LIMITED RIGHT UPPER QUADRANT COMPARISON:  11/20/2020  FINDINGS: Gallbladder: Mobile 5 mm nonshadowing stone within the gallbladder. No biliary sludge or wall thickening visualized. No sonographic Murphy sign noted by sonographer. Common bile duct: Diameter: 3 mm. Liver: No focal lesion identified. Within normal limits in parenchymal echogenicity. Portal vein is patent on color Doppler imaging with normal direction of blood flow towards the liver. Other: Moderate right hydronephrosis. Small amount of free fluid seen in the right upper quadrant. IMPRESSION: 1. Small stone in the gallbladder. Sonographic evidence of acute cholecystitis. 2. Moderate right hydronephrosis. 3. Small amount of free fluid in the right upper quadrant. Electronically Signed: By: Davina Poke D.O. On: 09/03/2022 16:01   [place] MFM OB FOLLOW UP  Result Date: 08/21/2022 ----------------------------------------------------------------------  OBSTETRICS REPORT                       (Signed Final 08/21/2022 05:07 pm) ---------------------------------------------------------------------- Patient Info  ID #:       [id number]                          D.O.B.:  [birth year] (26 yrs)  Name:       [facility]              Visit Date: 08/21/2022 02:34 pm ---------------------------------------------------------------------- Performed By  Attending:        Valeda Malm DO  Ref. Address:     [address]                                                             [place], [place]  Performed By:     Jeanene Erb BS,      Secondary Phy.:   [facility] MAU/Triage                    RDMS  Referred By:      Annice Needy              Location:         [facility]                               [facility] at                                                             [facility]                                                              Women ---------------------------------------------------------------------- Orders  #  Description                           Code        Ordered By  1  [place] MFM OB FOLLOW UP                   [id number]    Valeda Malm ----------------------------------------------------------------------  #  Order #                     Accession #                Episode #  1  [id number]                   [id number]  [id number] ---------------------------------------------------------------------- Indications  Poor fetal growth (<20%)                       O36.5920  Diabetes - Pregestational, 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 Cord Insertion:      Previously visualized  Amniotic Fluid  AFI FV:      Within normal limits                              Largest Pocket(cm)                              6.68 ---------------------------------------------------------------------- Biometry  BPD:      70.6  mm     G. Age:  28w 2d         65  %    CI:        76.57   %    70 - 86                                                          FL/HC:      19.6   %    18.8 - 20.6  HC:      255.6  mm     G. Age:  27w 5d         26  %    HC/AC:      1.16        1.05 - 1.21  AC:      220.7  mm     G. Age:  26w 4d         14  %    FL/BPD:     71.1   %    71 - 87  FL:       50.2  mm     G. Age:  27w 0d         19  %    FL/AC:      22.7   %    20 - 24  Est. FW:    1000  gm      2 lb 3 oz     16  %  ---------------------------------------------------------------------- OB History  Gravidity:    1 ---------------------------------------------------------------------- Gestational Age  LMP:           27w 4d  Date:  02/09/22                  EDD:   11/16/22  U/S Today:     27w 3d                                        EDD:   11/17/22  Best:          27w 4d     Det. By:  LMP  (02/09/22)          EDD:   11/16/22 ---------------------------------------------------------------------- Anatomy  Cranium:               Appears normal         Aortic Arch:            Previously seen  Cavum:                 Appears normal         Ductal Arch:            Previously seen  Ventricles:            Appears normal         Diaphragm:              Appears normal  Choroid Plexus:        Previously seen        Stomach:                Appears normal, left                                                                        sided  Cerebellum:            Previously seen        Abdomen:                Appears normal  Posterior Fossa:       Previously seen        Abdominal Wall:         Previously seen  Nuchal Fold:           Previously seen        Cord Vessels:           Previously seen  Face:                  Orbits and profile     Kidneys:                Appear normal                         previously seen  Lips:                  Previously seen        Bladder:                Appears normal  Thoracic:              Appears normal         Spine:  Previously seen  Heart:                 Appears normal         Upper Extremities:      Previously seen                         (4CH, axis, and                         situs)  RVOT:                  Previously seen        Lower Extremities:      Previously seen  LVOT:                  Previously seen  Other:  Technically difficult due to fetal position. ---------------------------------------------------------------------- Doppler - Fetal Vessels  Umbilical Artery   S/D      %tile      RI    %tile                      PSV    ADFV    RDFV                                                     (cm/s)   3.24       59    0.69       63                      47.9      No      No ---------------------------------------------------------------------- Comments  The patient is here for a follow-up ultrasound at 27w 4d. EDD:  11/16/2022 dated by LMP  (02/09/22). She has no concerns  today and reports good glycemic control.  Sonographic findings  Single intrauterine pregnancy.  Fetal cardiac activity:  Observed and appears normal.  Presentation: Cephalic.  Interval fetal anatomy appears normal.  Fetal biometry shows the estimated fetal weight at the 16  percentile and the abdominal circumference at the 14th  percentile.  Amniotic fluid volume: Within normal limits. MVP: 6.68 cm.  Placenta: Anterior.  Recommendations  - F/u for growth [place] in 3 weeks  - Weekly antenatal testing to start at 51 weeks ----------------------------------------------------------------------                  Valeda Malm, DO Electronically Signed Final Report   08/21/2022 05:07 pm ----------------------------------------------------------------------  [place] MFM UA CORD DOPPLER  Result Date: 08/14/2022 ----------------------------------------------------------------------  OBSTETRICS REPORT                        (Signed Final 08/14/2022 10:34 am) ---------------------------------------------------------------------- Patient Info  ID #:       [id number]                          D.O.B.:  1996/05/31 (26 yrs)  Name:       [facility]              Visit Date: 08/14/2022 09:09 am ---------------------------------------------------------------------- Performed By  Attending:        Sander Nephew  Ref. Address:      [place]                                                              [place], [place]  Performed By:     Stephenie Acres        Secondary Phy.:    [facility] MAU/Triage                    BS RDMS  Referred By:      Annice Needy              [facility]                    ECKSTAT MD                                [place] ---------------------------------------------------------------------- Orders  #  Description                           Code        Ordered By  1  [place] MFM UA CORD DOPPLER                76820.02    Radene Gunning  2  [place] MFM OB LIMITED                     [id number]    Radene Gunning ----------------------------------------------------------------------  #  Order #                     Accession #                Episode #  1  [id number]                   [id number]                 [id number]  2  [id number]                   [id number]                 [id number] ---------------------------------------------------------------------- Indications  Maternal care for known or suspected poor       O36.5920  fetal growth, second trimester, not applicable  or unspecified IUGR  Diabetes - Pregestational, 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 Cord Insertion:       Previously visualized  Amniotic Fluid  AFI FV:      Within normal limits                              Largest Pocket(cm)                              4.8 ---------------------------------------------------------------------- Biometry  LV:        3.9  mm ---------------------------------------------------------------------- OB History  Gravidity:    1 ---------------------------------------------------------------------- Gestational Age   LMP:           26w 4d        Date:  02/09/22                  EDD:   11/16/22  Best:          Vicenta Dunning 4d     Det. By:  LMP  (02/09/22)          EDD:   11/16/22 ---------------------------------------------------------------------- Anatomy  Cranium:               Appears normal         Diaphragm:              Appears normal  Cavum:                 Appears normal         Stomach:                Appears normal, left                                                                        sided  Ventricles:            Appears normal         Kidneys:                Appear normal  Heart:                 Small Pericardial      Bladder:                Appears normal  effusion ---------------------------------------------------------------------- Doppler - Fetal Vessels  Umbilical Artery    S/D    %tile      RI    %tile      PI    %tile     PSV    ADFV    RDFV                                                     (cm/s)    3.1       46     0.7       63     1.1       61     47.9      No      No ---------------------------------------------------------------------- Impression  Antenatal testing due to IUGR with an EFW 6th%  There is good fetal movement and amniotic fluid volume  UA Dopplers are normal with no evidence of AEDF or REDF  There is a small physiologic pericardial effusion. ---------------------------------------------------------------------- Recommendations  Continue weekly testing with UA Dopplers and serial growth. ----------------------------------------------------------------------              Sander Nephew, MD Electronically Signed Final Report   08/14/2022 10:34 am ----------------------------------------------------------------------  [place] MFM OB LIMITED  Result Date: 08/14/2022 ----------------------------------------------------------------------  OBSTETRICS REPORT                        (Signed Final 08/14/2022 10:34 am)  ---------------------------------------------------------------------- Patient Info  ID #:       [id number]                          D.O.B.:  [birth year] (26 yrs)  Name:       [facility]              Visit Date: 08/14/2022 09:09 am ---------------------------------------------------------------------- Performed By  Attending:        Sander Nephew      Ref. Address:      [place]                                                              [place], [place]  Performed By:     Stephenie Acres  Secondary Phy.:    [place] MAU/Triage                    BS RDMS  Referred By:      Annice Needy              [facility]                    ECKSTAT MD                                [place] ---------------------------------------------------------------------- Orders  #  Description                           Code        Ordered By  1  [place] MFM UA CORD DOPPLER                76820.02    PAULA DUNCAN  2  [place] MFM OB LIMITED                     [id number]    Radene Gunning ----------------------------------------------------------------------  #  Order #                     Accession #                Episode #  1  [id number]                   [id number]                 [id number]  2  [id number]                   [id number]                 [id number] ---------------------------------------------------------------------- Indications  Maternal care for known or suspected poor       O36.5920  fetal growth, second trimester, not applicable  or unspecified IUGR  Diabetes - Pregestational, 2nd trimester        O24.312  Hyperemesis gravidarum                          O21.0  [redacted] weeks gestation of pregnancy                 Z3A.26 ---------------------------------------------------------------------- Fetal Evaluation  Num Of Fetuses:          1  Fetal  Heart Rate(bpm):   125  Cardiac Activity:        Observed  Presentation:            Breech  Placenta:                Anterior  P. Cord Insertion:       Previously visualized  Amniotic Fluid  AFI FV:      Within normal limits                              Largest Pocket(cm)                              4.8 ---------------------------------------------------------------------- Biometry  LV:  3.9  mm ---------------------------------------------------------------------- OB History  Gravidity:    1 ---------------------------------------------------------------------- Gestational Age  LMP:           26w 4d        Date:  02/09/22                  EDD:   11/16/22  Best:          Vicenta Dunning 4d     Det. By:  LMP  (02/09/22)          EDD:   11/16/22 ---------------------------------------------------------------------- Anatomy  Cranium:               Appears normal         Diaphragm:              Appears normal  Cavum:                 Appears normal         Stomach:                Appears normal, left                                                                        sided  Ventricles:            Appears normal         Kidneys:                Appear normal  Heart:                 Small Pericardial      Bladder:                Appears normal                         effusion ---------------------------------------------------------------------- Doppler - Fetal Vessels  Umbilical Artery    S/D    %tile      RI    %tile      PI    %tile     PSV    ADFV    RDFV                                                     (cm/s)    3.1       46     0.7       63     1.1       61     47.9      No      No ---------------------------------------------------------------------- Impression  Antenatal testing due to IUGR with an EFW 6th%  There is good fetal movement and amniotic fluid volume  UA Dopplers are normal with no evidence of AEDF or REDF  There is a small physiologic pericardial effusion.  ---------------------------------------------------------------------- Recommendations  Continue weekly testing with UA Dopplers and serial growth. ----------------------------------------------------------------------              Sander Nephew, MD Electronically Signed Final Report  08/14/2022 10:34 am ----------------------------------------------------------------------  [place] MFM OB Limited  Result Date: 08/09/2022 ----------------------------------------------------------------------  OBSTETRICS REPORT                       (Signed Final 08/09/2022 02:56 pm) ---------------------------------------------------------------------- Patient Info  ID #:       [id number]                          D.O.B.:  Sep 13, 1995 (26 yrs)  Name:       [facility]              Visit Date: 08/09/2022 01:51 pm ---------------------------------------------------------------------- Performed By  Attending:        Johnell Comings MD         Ref. Address:     [address] [place]                                                             [place], [place]  Performed By:     Berlinda Last          Secondary Phy.:   [facility] MAU/Triage                    RDMS  Referred By:      Annice Needy              [facility]         [facility]                    ECKSTAT MD                               [place] ---------------------------------------------------------------------- Orders  #  Description                           Code        Ordered By  1  [place] MFM OB LIMITED                     [id number]    MATTHEW ECKSTAT ----------------------------------------------------------------------  #  Order #                     Accession #                Episode #  1  [id number]  [id number]                 [id number] ---------------------------------------------------------------------- Indications   Diabetes - Pregestational, 2nd trimester       O62.312  Maternal care for known or suspected poor      O36.5920  fetal growth, second trimester, not applicable  or unspecified IUGR  [redacted] weeks gestation of pregnancy                Z3A.25 ---------------------------------------------------------------------- Fetal Evaluation  Num Of Fetuses:         1  Fetal Heart Rate(bpm):  150  Cardiac Activity:       Observed  Presentation:           Transverse, head to maternal left  Placenta:               Anterior  P. Cord Insertion:      Visualized  Amniotic Fluid  AFI FV:      Within normal limits                              Largest Pocket(cm)                              4.3  Comment:    No placental abruption or previa identified. ---------------------------------------------------------------------- OB History  Gravidity:    1 ---------------------------------------------------------------------- Gestational Age  LMP:           25w 6d        Date:  02/09/22                  EDD:   11/16/22  Best:          25w 6d     Det. By:  LMP  (02/09/22)          EDD:   11/16/22 ---------------------------------------------------------------------- Anatomy  Cranium:               Appears normal         Diaphragm:              Appears normal  Heart:                 Pericardial            Stomach:                Appears normal, left                         effusion                                                                        sided  RVOT:                  Appears normal         Kidneys:                Appear normal  LVOT:                  Appears normal  Bladder:                Appears normal ---------------------------------------------------------------------- Doppler - Fetal Vessels  Umbilical Artery   S/D     %tile      RI    %tile      PI    %tile     PSV    ADFV    RDFV                                                     (cm/s)    2.5       12     0.6       12     0.9       18     39.9      No      No  ---------------------------------------------------------------------- Cervix Uterus Adnexa  Cervix  Length:            3.3  cm.  Normal appearance by transabdominal scan  Uterus  No abnormality visualized.  Right Ovary  Within normal limits.  Left Ovary  Within normal limits.  Adnexa  No abnormality visualized ---------------------------------------------------------------------- Comments  This patient presented to the MAU due to abdominal  pain/cramping.  A limited ultrasound performed today shows that the fetus is  in the transverse presentation.  There was normal amniotic fluid noted.  Doppler studies of the umbilical arteries continues to show  normal forward flow with a normal S/D ratio of 2.5.  There  were no signs of absent or reversed end-diastolic flow.  Her cervical length appeared 3.3 cm long without any signs of  funneling.  The patient had a deceleration while in the MAU.  She should  be placed on prolonged monitoring.  Should the patient continue to have contractions, a course of  Indocin may be considered for tocolysis. ----------------------------------------------------------------------                   Johnell Comings, MD Electronically Signed Final Report   08/09/2022 02:56 pm ----------------------------------------------------------------------  [place] MFM OB LIMITED  Result Date: 08/08/2022 ----------------------------------------------------------------------  OBSTETRICS REPORT                       (Signed Final 08/08/2022 11:26 am) ---------------------------------------------------------------------- Patient Info  ID #:       [id number]                          D.O.B.:  03-Aug-1995 (26 yrs)  Name:       [facility]              Visit Date: 08/08/2022 10:41 am ---------------------------------------------------------------------- Performed By  Attending:        Johnell Comings MD         Ref. Address:     [address]                                                             [place], [place]  [id number]  Performed By:     Milus Glazier,      [facility]                    RD[facility]                                     [facility]                                                             [place] [facility]                                                             [facility]  Referred By:      Osborne Oman MD ---------------------------------------------------------------------- Orders  #  Description                           Code        Ordered By  1  [place] MFM OB LIMITED                     76815.01    Valeda Malm  2  [place] MFM UA CORD DOPPLER                76820.02    [facility] ----------------------------------------------------------------------  #  Order #                     Accession #                Episode #  1  [id number]                   [id number]                 [id number]  2  [id number]                   [id number]                 [id number] ---------------------------------------------------------------------- Indications  Maternal care for known or suspected poor      O36.5920  fetal growth, second trimester, not applicable  or unspecified IUGR  Diabetes - Pregestational, 2nd trimester       O33.312  [redacted] weeks gestation of pregnancy                Z3A.25 ---------------------------------------------------------------------- Fetal Evaluation  Num Of Fetuses:         1  Fetal Heart Rate(bpm):  151  Cardiac Activity:       Observed  Presentation:           Breech  Placenta:               Posterior  P. Cord Insertion:  Visualized  Amniotic Fluid  AFI FV:      Within normal limits  AFI Sum(cm)     %Tile       Largest Pocket(cm)  12.79           33          3.99  RUQ(cm)       RLQ(cm)       LUQ(cm)        LLQ(cm)  3.99          2.48          3.31           3.01 ---------------------------------------------------------------------- OB History  Gravidity:    1  ---------------------------------------------------------------------- Gestational Age  LMP:           25w 5d        Date:  02/09/22                  EDD:   11/16/22  Best:          Melvyn Novas 5d     Det. By:  LMP  (02/09/22)          EDD:   11/16/22 ---------------------------------------------------------------------- Anatomy  Cranium:               Appears normal         Aortic Arch:            Previously seen  Cavum:                 Appears normal         Ductal Arch:            Previously seen  Ventricles:            Appears normal         Diaphragm:              Appears normal  Choroid Plexus:        Previously seen        Stomach:                Appears normal, left                                                                        sided  Cerebellum:            Previously seen        Abdomen:                Appears normal  Posterior Fossa:       Previously seen        Abdominal Wall:         Previously seen  Nuchal Fold:           Previously seen        Cord Vessels:           Previously seen  Face:                  Orbits and profile     Kidneys:                Appear normal  previously seen  Lips:                  Previously seen        Bladder:                Appears normal  Thoracic:              Appears normal         Spine:                  Appears normal  Heart:                 Appears normal         Upper Extremities:      Previously seen                         (4CH, axis, and                         situs)  RVOT:                  Previously seen        Lower Extremities:      Previously seen  LVOT:                  Previously seen ---------------------------------------------------------------------- Doppler - Fetal Vessels  Umbilical Artery   S/D     %tile      RI    %tile      PI    %tile     PSV    ADFV    RDFV                                                     (cm/s)    3.9       78    0.71       64    1.18       71    46.56      No      No  ---------------------------------------------------------------------- Cervix Uterus Adnexa  Right Ovary  Not visualized.  Left Ovary  Not visualized. ---------------------------------------------------------------------- Comments  This patient was seen due to an IUGR fetus.  She denies any  problems since her last exam and reports feeling fetal  movements throughout the day.  Her pregnancy has also  been complicated by pregestational diabetes (type I).  The total AFI was 12.79 cm (within normal limits).  Doppler studies of the umbilical arteries performed due to  fetal growth restriction showed a normal S/D ratio of 3.9.  There were no signs of absent or reversed end-diastolic flow  noted today.  Due to pregestational diabetes, she was given a referral for a  fetal echocardiogram with [facility] pediatric cardiology.  She  reports that she has not been contacted yet for a fetal  echocardiogram.  She will return in 1 week for another umbilical artery Doppler  study.  We will start weekly NSTs at 28 weeks. ----------------------------------------------------------------------                   Johnell Comings, MD Electronically Signed Final Report   08/08/2022 11:26 am ----------------------------------------------------------------------  [place] MFM UA CORD DOPPLER  Result Date: 08/08/2022 ----------------------------------------------------------------------  OBSTETRICS  REPORT                       (Signed Final 08/08/2022 11:26 am) ---------------------------------------------------------------------- Patient Info  ID #:       [id number]                          D.O.B.:  [birth year] (26 yrs)  Name:       [facility]              Visit Date: 08/08/2022 10:41 am ---------------------------------------------------------------------- Performed By  Attending:        Johnell Comings MD         Ref. Address:     [address]                                                             [place], [place]  Performed By:     Milus Glazier,      [facility]         [facility]                    RD[facility]                                     [facility]                                                             [place] [facility]                                                             Women  Referred By:      Osborne Oman MD ---------------------------------------------------------------------- Orders  #  Description                           Code        Ordered By  1  [place] MFM OB LIMITED                     76815.01    Lamar  2  [place] MFM UA CORD DOPPLER                [id number]    BURK SCHAIBLE ----------------------------------------------------------------------  #  Order #                     Accession #                Episode #  1  [id number]                   [id number]                 [id number]  2  [id number]                   [id number]                 [id number] ---------------------------------------------------------------------- Indications  Maternal care for known or suspected poor      O36.5920  fetal growth, second trimester, not applicable  or unspecified IUGR  Diabetes - Pregestational, 2nd trimester       O24.312  [redacted] weeks gestation of pregnancy                Z3A.25 ---------------------------------------------------------------------- Fetal Evaluation  Num Of Fetuses:         1  Fetal Heart Rate(bpm):  151  Cardiac Activity:       Observed  Presentation:           Breech  Placenta:               Posterior  P. Cord Insertion:      Visualized  Amniotic Fluid  AFI FV:      Within normal limits  AFI Sum(cm)     %Tile       Largest Pocket(cm)  12.79           33          3.99  RUQ(cm)       RLQ(cm)       LUQ(cm)        LLQ(cm)  3.99          2.48          3.31           3.01 ---------------------------------------------------------------------- OB History  Gravidity:    1 ----------------------------------------------------------------------  Gestational Age  LMP:           25w 5d        Date:  02/09/22                  EDD:   11/16/22  Best:          25w 5d     Det. By:  LMP  (02/09/22)          EDD:   11/16/22 ---------------------------------------------------------------------- Anatomy  Cranium:               Appears normal         Aortic Arch:            Previously seen  Cavum:                 Appears normal         Ductal Arch:            Previously seen  Ventricles:            Appears normal         Diaphragm:              Appears normal  Choroid Plexus:        Previously seen  Stomach:                Appears normal, left                                                                        sided  Cerebellum:            Previously seen        Abdomen:                Appears normal  Posterior Fossa:       Previously seen        Abdominal Wall:         Previously seen  Nuchal Fold:           Previously seen        Cord Vessels:           Previously seen  Face:                  Orbits and profile     Kidneys:                Appear normal                         previously seen  Lips:                  Previously seen        Bladder:                Appears normal  Thoracic:              Appears normal         Spine:                  Appears normal  Heart:                 Appears normal         Upper Extremities:      Previously seen                         (4CH, axis, and                         situs)  RVOT:                  Previously seen        Lower Extremities:      Previously seen  LVOT:                  Previously seen ---------------------------------------------------------------------- Doppler - Fetal Vessels  Umbilical Artery   S/D     %tile      RI    %tile      PI    %tile     PSV    ADFV    RDFV                                                     (  cm/s)    3.9       78    0.71       64    1.18       71    46.56      No      No ---------------------------------------------------------------------- Cervix Uterus Adnexa  Right Ovary   Not visualized.  Left Ovary  Not visualized. ---------------------------------------------------------------------- Comments  This patient was seen due to an IUGR fetus.  She denies any  problems since her last exam and reports feeling fetal  movements throughout the day.  Her pregnancy has also  been complicated by pregestational diabetes (type I).  The total AFI was 12.79 cm (within normal limits).  Doppler studies of the umbilical arteries performed due to  fetal growth restriction showed a normal S/D ratio of 3.9.  There were no signs of absent or reversed end-diastolic flow  noted today.  Due to pregestational diabetes, she was given a referral for a  fetal echocardiogram with [facility] pediatric cardiology.  She  reports that she has not been contacted yet for a fetal  echocardiogram.  She will return in 1 week for another umbilical artery Doppler  study.  We will start weekly NSTs at 28 weeks. ----------------------------------------------------------------------                   Johnell Comings, MD Electronically Signed Final Report   08/08/2022 11:26 am ----------------------------------------------------------------------   Future Appointments  Date Time Provider [place]  09/06/2022 11:15 AM Chancy Milroy, MD [facility] None  09/11/2022  1:45 PM [facility] NURSE [facility]  09/11/2022  2:00 PM [facility] US1 [facility]  09/20/2022 11:15 AM Chancy Milroy, MD [place] None  09/21/2022  2:30 PM Brita Romp, NP [facility] None  09/25/2022 10:45 AM [facility] NURSE [facility]  09/25/2022 11:00 AM [facility] US1 [facility]  10/02/2022 10:45 AM [facility] NURSE [facility]  10/02/2022 11:00 AM [facility] US1 [facility]  10/04/2022 11:15 AM Woodroe Mode, MD [place] None  10/09/2022 12:30 PM [facility] NURSE [facility]  10/09/2022 12:45 PM [facility] US4 [facility]  10/18/2022 11:15 AM Chancy Milroy, MD [place] None    Discharge Condition: Stable  Discharge disposition: 01-Home or Self  Care       Discharge Instructions     Discharge activity:  No Restrictions   Complete by: As directed    Discharge diet:   Complete by: As directed    Carb controlled regular   No sexual activity restrictions   Complete by: As directed    Notify physician for a general feeling that "something is not right"   Complete by: As directed    Notify physician for increase or change in vaginal discharge   Complete by: As directed    Notify physician for intestinal cramps, with or without diarrhea, sometimes described as "gas pain"   Complete by: As directed    Notify physician for leaking of fluid   Complete by: As directed    Notify physician for low, dull backache, unrelieved by heat or Tylenol   Complete by: As directed    Notify physician for menstrual like cramps   Complete by: As directed    Notify physician for pelvic pressure   Complete by: As directed    Notify physician for uterine contractions.  These may be painless and feel like the uterus is tightening or the baby is  "balling up"   Complete by: As directed  Notify physician for vaginal bleeding   Complete by: As directed    PRETERM LABOR:  Includes any of the follwing symptoms that occur between 20 - [redacted] weeks gestation.  If these symptoms are not stopped, preterm labor can result in preterm delivery, placing your baby at risk   Complete by: As directed       Allergies as of 09/05/2022   No Known Allergies      Medication List     STOP taking these medications    Accu-Chek FastClix Lancets Misc   accu-chek soft touch lancets   B-D UF III MINI PEN NEEDLES 31G X 5 MM Misc Generic drug: Insulin Pen Needle   insulin lispro 100 UNIT/ML injection Commonly known as: HumaLOG   insulin lispro 100 UNIT/ML KwikPen Commonly known as: HumaLOG KwikPen       TAKE these medications    Accu-Chek Guide test strip Generic drug: glucose blood Use as instructed to monitor glucose 8 times daily (before meals,  after meals) due to gestational diabetes.   Alcohol Pads 70 % Pads Use to wipe skin prior to insulin injections twice daily   Blood Pressure Kit Devi 1 kit by Does not apply route once a week.   cyclobenzaprine 10 MG tablet Commonly known as: FLEXERIL Take 1 tablet (10 mg total) by mouth 3 (three) times daily as needed for muscle spasms.   Dexcom G6 Receiver Devi USE AS DIRECTED   Dexcom G6 Sensor Misc Inject 1 applicator into the skin as directed. (change sensor every 10 days)   Dexcom G6 Transmitter Misc INJECT 1 DEVICE UNDER THE SKIN AS DIRECTED UP TO 8 TIMES WITH EACH NEW SENSOR   feeding supplement Liqd Take 237 mLs by mouth 2 (two) times daily between meals. What changed: Another medication with the same name was removed. Continue taking this medication, and follow the directions you see here.   Gojji Weight Scale Misc 1 Device by Does not apply route every 30 (thirty) days.   hydrOXYzine 50 MG tablet Commonly known as: ATARAX Take 1 tablet (50 mg total) by mouth every 6 (six) hours as needed for nausea or vomiting.   insulin pump Soln Inject into the skin every 4 (four) hours.   metoCLOPramide 10 MG tablet Commonly known as: REGLAN Take 1 tablet (10 mg total) by mouth every 6 (six) hours. What changed: Another medication with the same name was removed. Continue taking this medication, and follow the directions you see here.   Omnipod 5 G6 Pods (Gen 5) Misc 1 Units by Does not apply route every 3 (three) days.   ondansetron 4 MG disintegrating tablet Commonly known as: ZOFRAN-ODT Take 1 tablet (4 mg total) by mouth every 8 (eight) hours as needed for nausea or vomiting (second line after reglan). What changed: Another medication with the same name was removed. Continue taking this medication, and follow the directions you see here.   OneTouch Verio Flex System w/Device Kit Use to monitor glucose 8 times daily before and after meals.   pantoprazole 40 MG  tablet Commonly known as: PROTONIX Take 1 tablet (40 mg total) by mouth daily.   potassium chloride SA 20 MEQ tablet Commonly known as: KLOR-CON M Take 1 tablet (20 mEq total) by mouth 2 (two) times daily.   Prenatal Vitamin 27-0.8 MG Tabs Take 1 tablet by mouth daily.   promethazine 25 MG tablet Commonly known as: PHENERGAN Take 1 tablet (25 mg total) by mouth every 6 (six) hours as  needed for nausea or vomiting.   scopolamine 1 MG/3DAYS Commonly known as: TRANSDERM-SCOP Place 1 patch (1.5 mg total) onto the skin every 3 (three) days. What changed: Another medication with the same name was removed. Continue taking this medication, and follow the directions you see here.   sertraline 50 MG tablet Commonly known as: Zoloft Take 1 tablet (50 mg total) by mouth daily.        Follow-up [place] for [facility]. Go on 09/06/2022.   Specialty: Obstetrics and Gynecology Contact information: [place] [phone]                Total discharge time: 30 minutes   Signed: Griffin Basil M.D. 09/05/2022, 5:37 PM

## 2022-09-05 NOTE — Plan of Care (Signed)
  Problem: Education: Goal: Knowledge of disease or condition will improve Outcome: Completed/Met Goal: Knowledge of the prescribed therapeutic regimen will improve Outcome: Completed/Met Goal: Individualized Educational Video(s) Outcome: Completed/Met   Problem: Clinical Measurements: Goal: Complications related to the disease process, condition or treatment will be avoided or minimized Outcome: Completed/Met   Problem: Education: Goal: Knowledge of disease or condition will improve Outcome: Completed/Met Goal: Knowledge of the prescribed therapeutic regimen will improve Outcome: Completed/Met   Problem: Bowel/Gastric: Goal: Occurences of nausea and/or vomiting will decrease Outcome: Completed/Met   Problem: Fluid Volume: Goal: Maintenance of adequate hydration will improve Outcome: Completed/Met   Problem: Nutritional: Goal: Achievement of adequate weight for body size and type will improve Outcome: Completed/Met   Problem: Education: Goal: Knowledge of General Education information will improve Description: Including pain rating scale, medication(s)/side effects and non-pharmacologic comfort measures Outcome: Completed/Met   Problem: Health Behavior/Discharge Planning: Goal: Ability to manage health-related needs will improve Outcome: Completed/Met   Problem: Clinical Measurements: Goal: Ability to maintain clinical measurements within normal limits will improve Outcome: Completed/Met Goal: Will remain free from infection Outcome: Completed/Met Goal: Diagnostic test results will improve Outcome: Completed/Met Goal: Respiratory complications will improve Outcome: Completed/Met Goal: Cardiovascular complication will be avoided Outcome: Completed/Met   Problem: Activity: Goal: Risk for activity intolerance will decrease Outcome: Completed/Met   Problem: Nutrition: Goal: Adequate nutrition will be maintained Outcome: Completed/Met   Problem: Coping: Goal:  Level of anxiety will decrease Outcome: Completed/Met   Problem: Elimination: Goal: Will not experience complications related to bowel motility Outcome: Completed/Met Goal: Will not experience complications related to urinary retention Outcome: Completed/Met   Problem: Pain Managment: Goal: General experience of comfort will improve Outcome: Completed/Met   Problem: Safety: Goal: Ability to remain free from injury will improve Outcome: Completed/Met   Problem: Skin Integrity: Goal: Risk for impaired skin integrity will decrease Outcome: Completed/Met   Problem: Education: Goal: Ability to describe self-care measures that may prevent or decrease complications (Diabetes Survival Skills Education) will improve Outcome: Completed/Met Goal: Individualized Educational Video(s) Outcome: Completed/Met   Problem: Coping: Goal: Ability to adjust to condition or change in health will improve Outcome: Completed/Met   Problem: Fluid Volume: Goal: Ability to maintain a balanced intake and output will improve Outcome: Completed/Met   Problem: Health Behavior/Discharge Planning: Goal: Ability to identify and utilize available resources and services will improve Outcome: Completed/Met Goal: Ability to manage health-related needs will improve Outcome: Completed/Met   Problem: Metabolic: Goal: Ability to maintain appropriate glucose levels will improve Outcome: Completed/Met   Problem: Nutritional: Goal: Maintenance of adequate nutrition will improve Outcome: Completed/Met Goal: Progress toward achieving an optimal weight will improve Outcome: Completed/Met   Problem: Skin Integrity: Goal: Risk for impaired skin integrity will decrease Outcome: Completed/Met   Problem: Tissue Perfusion: Goal: Adequacy of tissue perfusion will improve Outcome: Completed/Met

## 2022-09-05 NOTE — Progress Notes (Signed)
Inpatient Diabetes Program Recommendations  AACE/ADA: New Consensus Statement on Inpatient Glycemic Control (2015)  Target Ranges:  Prepandial:   less than 140 mg/dL      Peak postprandial:   less than 180 mg/dL (1-2 hours)      Critically ill patients:  140 - 180 mg/dL   Lab Results  Component Value Date   GLUCAP 126 (H) 09/05/2022   HGBA1C 6.4 (H) 08/14/2022    Review of Glycemic Control  Latest Reference Range & Units 09/05/22 01:59 09/05/22 09:09 09/05/22 09:38 09/05/22 10:21  Glucose-Capillary 70 - 99 mg/dL 157 (H) 52 (L) 58 (L) 126 (H)   Diabetes history: DM 1 Outpatient Diabetes medications:  Omnipod insulin pump- does not use auto mode Basals: 12a-6a- 0.9 units/ hr 6a-MN- 0.7 units/hr 1 unit/15 grams CHO and 1 unit drops blood sugar 50 mg/dL Current orders for Inpatient glycemic control:  Insulin pump Inpatient Diabetes Program Recommendations:    Note low this AM.  Discussed with MD and orders received for patient to reduce her 12a-6a dose of insulin to 0.7 units/ hr.  Visited with patient at bedside and validated change in omnipod insulin pump (done by patient).  She states she is feeling much better.  Will follow.   Thanks,  Adah Perl, RN, BC-ADM Inpatient Diabetes Coordinator Pager [phone]  (8a-5p)

## 2022-09-05 NOTE — Progress Notes (Signed)
Hypoglycemic Event   CBG: 52   Treatment: 8 oz apple juice; Lemon ice pop   Symptoms: N/A   Follow-up CBG: [id number]    CBG Result:58   Treatment: 8 oz juice, pack graham crackers   Follow-up CBG: Time:1021   CBG Result:126   Possible Reasons for Event: Vomiting and Inadequate meal intake     Comments/MD notified: Dr. Elgie Congo 1100; No new orders.

## 2022-09-06 ENCOUNTER — Encounter: Payer: Self-pay | Admitting: Obstetrics and Gynecology

## 2022-09-06 ENCOUNTER — Other Ambulatory Visit: Payer: Self-pay | Admitting: Obstetrics and Gynecology

## 2022-09-06 ENCOUNTER — Ambulatory Visit (INDEPENDENT_AMBULATORY_CARE_PROVIDER_SITE_OTHER): Payer: Medicaid Other | Admitting: Obstetrics and Gynecology

## 2022-09-06 VITALS — BP 108/73 | HR 101 | Wt 136.0 lb

## 2022-09-06 DIAGNOSIS — O219 Vomiting of pregnancy, unspecified: Secondary | ICD-10-CM

## 2022-09-06 DIAGNOSIS — Z3A29 29 weeks gestation of pregnancy: Secondary | ICD-10-CM

## 2022-09-06 DIAGNOSIS — O365930 Maternal care for other known or suspected poor fetal growth, third trimester, not applicable or unspecified: Secondary | ICD-10-CM

## 2022-09-06 DIAGNOSIS — E108 Type 1 diabetes mellitus with unspecified complications: Secondary | ICD-10-CM

## 2022-09-06 DIAGNOSIS — O0990 Supervision of high risk pregnancy, unspecified, unspecified trimester: Secondary | ICD-10-CM

## 2022-09-06 DIAGNOSIS — O0993 Supervision of high risk pregnancy, unspecified, third trimester: Secondary | ICD-10-CM

## 2022-09-06 DIAGNOSIS — O365920 Maternal care for other known or suspected poor fetal growth, second trimester, not applicable or unspecified: Secondary | ICD-10-CM

## 2022-09-06 NOTE — Progress Notes (Signed)
Subjective:  Sabrina Sutton is a 27 y.o. G1P0 at 65w6dbeing seen today for ongoing prenatal care.  She is currently monitored for the following issues for this high-risk pregnancy and has Type 1 diabetes mellitus with complications ([place]; HSV-2 infection; Anxiety; Supervision of high risk pregnancy, antepartum; Hyperemesis gravidarum; IUGR (intrauterine growth restriction) affecting care of mother; Nausea and vomiting of pregnancy, antepartum; Gallstones; and Nausea and vomiting during pregnancy on their problem list.  Patient reports no complaints.  Contractions: Not present. Vag. Bleeding: None.  Movement: Present. Denies leaking of fluid.   Recent hospitalization noted. Pt reports doing well today. Denies N/V. Tolerating diet  The following portions of the patient's history were reviewed and updated as appropriate: allergies, current medications, past family history, past medical history, past social history, past surgical history and problem list. Problem list updated.  Objective:   Vitals:   09/06/22 1107  BP: 108/73  Pulse: (!) 101  Weight: 136 lb (61.7 kg)    Fetal Status: Fetal Heart Rate (bpm): 138   Movement: Present     General:  Alert, oriented and cooperative. Patient is in no acute distress.  Skin: Skin is warm and dry. No rash noted.   Cardiovascular: Normal heart rate noted  Respiratory: Normal respiratory effort, no problems with respiration noted  Abdomen: Soft, gravid, appropriate for gestational age. Pain/Pressure: Absent     Pelvic:  Cervical exam deferred        Extremities: Normal range of motion.  Edema: None  Mental Status: Normal mood and affect. Normal behavior. Normal judgment and thought content.   Urinalysis:      Assessment and Plan:  Pregnancy: G1P0 at 266w6d1. Supervision of high risk pregnancy, antepartum Stable  2. Type 1 diabetes mellitus with complications (HCCubaAs noted above Currently doing well Continue with current  management Serial growth scans and antenatal testing as per MFM protocol  3. Nausea and vomiting during pregnancy Stable  4. Poor fetal growth affecting management of mother in second trimester, single or unspecified fetus See # 2  Preterm labor symptoms and general obstetric precautions including but not limited to vaginal bleeding, contractions, leaking of fluid and fetal movement were reviewed in detail with the patient. Please refer to After Visit Summary for other counseling recommendations.  Return in about 2 weeks (around 09/20/2022) for OB visit, face to face, MD only.   ErChancy MilroyMD

## 2022-09-11 ENCOUNTER — Ambulatory Visit: Payer: Medicaid Other | Attending: Maternal & Fetal Medicine

## 2022-09-11 ENCOUNTER — Other Ambulatory Visit: Payer: Self-pay | Admitting: Maternal & Fetal Medicine

## 2022-09-11 ENCOUNTER — Encounter: Payer: Self-pay | Admitting: *Deleted

## 2022-09-11 ENCOUNTER — Ambulatory Visit: Payer: Medicaid Other | Admitting: *Deleted

## 2022-09-11 ENCOUNTER — Other Ambulatory Visit: Payer: Self-pay | Admitting: *Deleted

## 2022-09-11 VITALS — BP 125/83 | HR 87

## 2022-09-11 DIAGNOSIS — O0990 Supervision of high risk pregnancy, unspecified, unspecified trimester: Secondary | ICD-10-CM | POA: Insufficient documentation

## 2022-09-11 DIAGNOSIS — O365930 Maternal care for other known or suspected poor fetal growth, third trimester, not applicable or unspecified: Secondary | ICD-10-CM

## 2022-09-11 DIAGNOSIS — O24013 Pre-existing diabetes mellitus, type 1, in pregnancy, third trimester: Secondary | ICD-10-CM

## 2022-09-11 DIAGNOSIS — Z3A30 30 weeks gestation of pregnancy: Secondary | ICD-10-CM | POA: Diagnosis not present

## 2022-09-11 DIAGNOSIS — E109 Type 1 diabetes mellitus without complications: Secondary | ICD-10-CM

## 2022-09-11 DIAGNOSIS — F419 Anxiety disorder, unspecified: Secondary | ICD-10-CM | POA: Insufficient documentation

## 2022-09-11 DIAGNOSIS — Z3689 Encounter for other specified antenatal screening: Secondary | ICD-10-CM | POA: Diagnosis present

## 2022-09-11 DIAGNOSIS — O210 Mild hyperemesis gravidarum: Secondary | ICD-10-CM | POA: Diagnosis not present

## 2022-09-18 ENCOUNTER — Ambulatory Visit: Payer: Medicaid Other

## 2022-09-18 ENCOUNTER — Ambulatory Visit: Payer: Medicaid Other | Attending: Obstetrics

## 2022-09-19 ENCOUNTER — Encounter (HOSPITAL_COMMUNITY): Payer: Self-pay | Admitting: Obstetrics and Gynecology

## 2022-09-19 ENCOUNTER — Inpatient Hospital Stay (HOSPITAL_COMMUNITY)
Admission: AD | Admit: 2022-09-19 | Discharge: 2022-09-19 | Disposition: A | Discharge status: Home / Self Care | Payer: Medicaid Other | Attending: Obstetrics and Gynecology | Admitting: Obstetrics and Gynecology

## 2022-09-19 DIAGNOSIS — O479 False labor, unspecified: Secondary | ICD-10-CM | POA: Diagnosis not present

## 2022-09-19 DIAGNOSIS — O4700 False labor before 37 completed weeks of gestation, unspecified trimester: Secondary | ICD-10-CM

## 2022-09-19 DIAGNOSIS — O212 Late vomiting of pregnancy: Secondary | ICD-10-CM | POA: Diagnosis not present

## 2022-09-19 DIAGNOSIS — O24113 Pre-existing diabetes mellitus, type 2, in pregnancy, third trimester: Secondary | ICD-10-CM | POA: Diagnosis not present

## 2022-09-19 DIAGNOSIS — Z79899 Other long term (current) drug therapy: Secondary | ICD-10-CM | POA: Diagnosis not present

## 2022-09-19 DIAGNOSIS — Z3A31 31 weeks gestation of pregnancy: Secondary | ICD-10-CM

## 2022-09-19 DIAGNOSIS — Z794 Long term (current) use of insulin: Secondary | ICD-10-CM | POA: Insufficient documentation

## 2022-09-19 DIAGNOSIS — Z3689 Encounter for other specified antenatal screening: Secondary | ICD-10-CM

## 2022-09-19 DIAGNOSIS — O4703 False labor before 37 completed weeks of gestation, third trimester: Secondary | ICD-10-CM | POA: Diagnosis present

## 2022-09-19 DIAGNOSIS — E108 Type 1 diabetes mellitus with unspecified complications: Secondary | ICD-10-CM

## 2022-09-19 LAB — RAPID URINE DRUG SCREEN, HOSP PERFORMED
Amphetamines: NOT DETECTED
Barbiturates: NOT DETECTED
Benzodiazepines: NOT DETECTED
Cocaine: NOT DETECTED
Opiates: NOT DETECTED
Tetrahydrocannabinol: POSITIVE — AB

## 2022-09-19 LAB — CBC
HCT: 33.3 % — ABNORMAL LOW (ref 36.0–46.0)
Hemoglobin: 11.9 g/dL — ABNORMAL LOW (ref 12.0–15.0)
MCH: 29.8 pg (ref 26.0–34.0)
MCHC: 35.7 g/dL (ref 30.0–36.0)
MCV: 83.5 fL (ref 80.0–100.0)
Platelets: 315 10*3/uL (ref 150–400)
RBC: 3.99 MIL/uL (ref 3.87–5.11)
RDW: 13.5 % (ref 11.5–15.5)
WBC: 10.5 10*3/uL (ref 4.0–10.5)
nRBC: 0 % (ref 0.0–0.2)

## 2022-09-19 LAB — URINALYSIS, ROUTINE W REFLEX MICROSCOPIC
Bilirubin Urine: NEGATIVE
Glucose, UA: NEGATIVE mg/dL
Ketones, ur: 40 mg/dL — AB
Nitrite: NEGATIVE
Protein, ur: NEGATIVE mg/dL
Specific Gravity, Urine: 1.02 (ref 1.005–1.030)
pH: 8.5 — ABNORMAL HIGH (ref 5.0–8.0)

## 2022-09-19 LAB — BASIC METABOLIC PANEL
Anion gap: 13 (ref 5–15)
BUN: 6 mg/dL (ref 6–20)
CO2: 20 mmol/L — ABNORMAL LOW (ref 22–32)
Calcium: 8.8 mg/dL — ABNORMAL LOW (ref 8.9–10.3)
Chloride: 98 mmol/L (ref 98–111)
Creatinine, Ser: 0.56 mg/dL (ref 0.44–1.00)
GFR, Estimated: >60 mL/min (ref >60)
Glucose, Bld: 91 mg/dL (ref 70–99)
Potassium: 3.4 mmol/L — ABNORMAL LOW (ref 3.5–5.1)
Sodium: 131 mmol/L — ABNORMAL LOW (ref 135–145)

## 2022-09-19 LAB — BETA-HYDROXYBUTYRIC ACID: Beta-Hydroxybutyric Acid: 0.68 mmol/L — ABNORMAL HIGH (ref 0.05–0.27)

## 2022-09-19 LAB — FETAL FIBRONECTIN: Fetal Fibronectin: NEGATIVE

## 2022-09-19 LAB — URINALYSIS, MICROSCOPIC (REFLEX)

## 2022-09-19 MED ORDER — HALOPERIDOL LACTATE 5 MG/ML IJ SOLN
2.0000 mg | Freq: Once | INTRAMUSCULAR | Status: AC
  Administered 2022-09-19: 2 mg via INTRAVENOUS
  Filled 2022-09-19: qty 1

## 2022-09-19 MED ORDER — DIPHENHYDRAMINE HCL 50 MG/ML IJ SOLN
25.0000 mg | Freq: Once | INTRAMUSCULAR | Status: AC
  Administered 2022-09-19: 25 mg via INTRAVENOUS
  Filled 2022-09-19: qty 1

## 2022-09-19 MED ORDER — INDOMETHACIN 25 MG PO CAPS
50.0000 mg | ORAL_CAPSULE | Freq: Once | ORAL | Status: AC
  Administered 2022-09-19: 50 mg via ORAL
  Filled 2022-09-19: qty 2

## 2022-09-19 MED ORDER — LACTATED RINGERS IV BOLUS
1000.0000 mL | Freq: Once | INTRAVENOUS | Status: AC
  Administered 2022-09-19: 1000 mL via INTRAVENOUS

## 2022-09-19 NOTE — MAU Note (Signed)
.  Sabrina Sutton is a 27 y.o. at 100w5dhere in MAU via EMS with reports of contractions that began at [employer] Pt denies SROM, vaginal bleeding or bloody show.    Last week she went and received IV medication and po medication for preterm labor.   Onset of complaint: 0030 Pain score:  Vitals:   09/19/22 0132  BP: 116/82  Pulse: (!) 109  Resp: 16  Temp: 98.1 F (36.7 C)     FHT:145bpm Lab orders placed from triage:  None
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
## 2022-09-19 NOTE — Discharge Instructions (Signed)

## 2022-09-20 ENCOUNTER — Encounter: Payer: Self-pay | Admitting: Obstetrics and Gynecology

## 2022-09-20 ENCOUNTER — Other Ambulatory Visit: Payer: Self-pay | Admitting: Obstetrics and Gynecology

## 2022-09-20 ENCOUNTER — Other Ambulatory Visit: Payer: Self-pay | Admitting: Family Medicine

## 2022-09-20 ENCOUNTER — Encounter: Payer: Self-pay | Admitting: Certified Nurse Midwife

## 2022-09-20 ENCOUNTER — Ambulatory Visit (INDEPENDENT_AMBULATORY_CARE_PROVIDER_SITE_OTHER): Payer: Medicaid Other | Admitting: Obstetrics and Gynecology

## 2022-09-20 VITALS — BP 118/81 | HR 93 | Wt 120.0 lb

## 2022-09-20 DIAGNOSIS — O24013 Pre-existing diabetes mellitus, type 1, in pregnancy, third trimester: Secondary | ICD-10-CM

## 2022-09-20 DIAGNOSIS — O0990 Supervision of high risk pregnancy, unspecified, unspecified trimester: Secondary | ICD-10-CM

## 2022-09-20 DIAGNOSIS — O365930 Maternal care for other known or suspected poor fetal growth, third trimester, not applicable or unspecified: Secondary | ICD-10-CM

## 2022-09-20 DIAGNOSIS — O219 Vomiting of pregnancy, unspecified: Secondary | ICD-10-CM

## 2022-09-20 DIAGNOSIS — E108 Type 1 diabetes mellitus with unspecified complications: Secondary | ICD-10-CM

## 2022-09-20 DIAGNOSIS — Z3A31 31 weeks gestation of pregnancy: Secondary | ICD-10-CM

## 2022-09-20 NOTE — Progress Notes (Signed)
Subjective:  Sabrina Sutton is a 27 y.o. G1P0 at 84w6dbeing seen today for ongoing prenatal care.  She is currently monitored for the following issues for this high-risk pregnancy and has Type 1 diabetes mellitus with complications ([facility]; HSV-2 infection; Anxiety; Supervision of high risk pregnancy, antepartum; Hyperemesis gravidarum; IUGR (intrauterine growth restriction) affecting care of mother; Nausea and vomiting of pregnancy, antepartum; Gallstones; and Nausea and vomiting during pregnancy on their problem list.  Patient reports  recently seen in MAU for N/V and ut ctx. Ctx have resolved. N/V related to cannabis use. Last use this past Sat .  Contractions: Irregular. Vag. Bleeding: None.  Movement: Present. Denies leaking of fluid.   The following portions of the patient's history were reviewed and updated as appropriate: allergies, current medications, past family history, past medical history, past social history, past surgical history and problem list. Problem list updated.  Objective:   Vitals:   09/20/22 1458  BP: 118/81  Pulse: 93  Weight: 120 lb (54.4 kg)    Fetal Status: Fetal Heart Rate (bpm): 141   Movement: Present     General:  Alert, oriented and cooperative. Patient is in no acute distress.  Skin: Skin is warm and dry. No rash noted.   Cardiovascular: Normal heart rate noted  Respiratory: Normal respiratory effort, no problems with respiration noted  Abdomen: Soft, gravid, appropriate for gestational age. Pain/Pressure: Present     Pelvic:  Cervical exam deferred        Extremities: Normal range of motion.  Edema: None  Mental Status: Normal mood and affect. Normal behavior. Normal judgment and thought content.   Urinalysis:      Assessment and Plan:  Pregnancy: G1P0 at 336w6d1. Supervision of high risk pregnancy, antepartum Stable  2. Type 1 diabetes mellitus with complications (HCC) Reports CBG's 80-100's Serial growth and antenatal testing as per  MFM Missed this weeks appt. Has appt for 09/25/22 Importance of U/S reviewed with pt  3. Nausea and vomiting during pregnancy Related to cannabis use. Discussed with pt  4. Poor fetal growth affecting management of mother in third trimester, single or unspecified fetus See above  Preterm labor symptoms and general obstetric precautions including but not limited to vaginal bleeding, contractions, leaking of fluid and fetal movement were reviewed in detail with the patient. Please refer to After Visit Summary for other counseling recommendations.  Return in about 2 weeks (around 10/04/2022) for OB visit, face to face, MD only.   ErChancy MilroyMD

## 2022-09-21 ENCOUNTER — Encounter: Payer: Self-pay | Admitting: Certified Nurse Midwife

## 2022-09-21 ENCOUNTER — Ambulatory Visit: Payer: Medicaid Other | Admitting: Nurse Practitioner

## 2022-09-21 ENCOUNTER — Other Ambulatory Visit: Payer: Self-pay

## 2022-09-21 DIAGNOSIS — E109 Type 1 diabetes mellitus without complications: Secondary | ICD-10-CM

## 2022-09-21 MED ORDER — CYCLOBENZAPRINE HCL 10 MG PO TABS: 10.0000 mg | ORAL_TABLET | Freq: Three times a day (TID) | ORAL | 0 refills | Status: DC | PRN

## 2022-09-21 MED ORDER — ONDANSETRON 4 MG PO TBDP: 4.0000 mg | ORAL_TABLET | Freq: Three times a day (TID) | ORAL | 0 refills | Status: DC | PRN

## 2022-09-22 ENCOUNTER — Encounter (HOSPITAL_COMMUNITY): Payer: Self-pay | Admitting: Obstetrics & Gynecology

## 2022-09-22 ENCOUNTER — Inpatient Hospital Stay (HOSPITAL_COMMUNITY)
Admission: AD | Admit: 2022-09-22 | Discharge: 2022-09-22 | Disposition: A | Discharge status: Home / Self Care | Payer: Medicaid Other | Attending: Obstetrics & Gynecology | Admitting: Obstetrics & Gynecology

## 2022-09-22 DIAGNOSIS — F419 Anxiety disorder, unspecified: Secondary | ICD-10-CM | POA: Insufficient documentation

## 2022-09-22 DIAGNOSIS — O0990 Supervision of high risk pregnancy, unspecified, unspecified trimester: Secondary | ICD-10-CM

## 2022-09-22 DIAGNOSIS — O212 Late vomiting of pregnancy: Secondary | ICD-10-CM | POA: Diagnosis not present

## 2022-09-22 DIAGNOSIS — R1084 Generalized abdominal pain: Secondary | ICD-10-CM | POA: Diagnosis not present

## 2022-09-22 DIAGNOSIS — R1111 Vomiting without nausea: Secondary | ICD-10-CM | POA: Diagnosis not present

## 2022-09-22 DIAGNOSIS — E860 Dehydration: Secondary | ICD-10-CM | POA: Diagnosis not present

## 2022-09-22 DIAGNOSIS — O479 False labor, unspecified: Secondary | ICD-10-CM | POA: Diagnosis not present

## 2022-09-22 DIAGNOSIS — O219 Vomiting of pregnancy, unspecified: Secondary | ICD-10-CM

## 2022-09-22 DIAGNOSIS — R112 Nausea with vomiting, unspecified: Secondary | ICD-10-CM | POA: Diagnosis not present

## 2022-09-22 DIAGNOSIS — O99343 Other mental disorders complicating pregnancy, third trimester: Secondary | ICD-10-CM | POA: Insufficient documentation

## 2022-09-22 DIAGNOSIS — R109 Unspecified abdominal pain: Secondary | ICD-10-CM

## 2022-09-22 DIAGNOSIS — O99283 Endocrine, nutritional and metabolic diseases complicating pregnancy, third trimester: Secondary | ICD-10-CM | POA: Insufficient documentation

## 2022-09-22 DIAGNOSIS — Z3A32 32 weeks gestation of pregnancy: Secondary | ICD-10-CM | POA: Diagnosis not present

## 2022-09-22 DIAGNOSIS — R110 Nausea: Secondary | ICD-10-CM | POA: Diagnosis not present

## 2022-09-22 DIAGNOSIS — O0993 Supervision of high risk pregnancy, unspecified, third trimester: Secondary | ICD-10-CM | POA: Insufficient documentation

## 2022-09-22 DIAGNOSIS — O4703 False labor before 37 completed weeks of gestation, third trimester: Secondary | ICD-10-CM | POA: Diagnosis not present

## 2022-09-22 DIAGNOSIS — O26893 Other specified pregnancy related conditions, third trimester: Secondary | ICD-10-CM | POA: Diagnosis not present

## 2022-09-22 HISTORY — DX: Unspecified ovarian cyst, unspecified side: N83.209

## 2022-09-22 LAB — URINALYSIS, MICROSCOPIC (REFLEX): RBC / HPF: NONE SEEN RBC/hpf (ref 0–5)

## 2022-09-22 LAB — COMPREHENSIVE METABOLIC PANEL
ALT: 16 U/L (ref 0–44)
AST: 23 U/L (ref 15–41)
Albumin: 3.2 g/dL — ABNORMAL LOW (ref 3.5–5.0)
Alkaline Phosphatase: 144 U/L — ABNORMAL HIGH (ref 38–126)
Anion gap: 13 (ref 5–15)
BUN: 5 mg/dL — ABNORMAL LOW (ref 6–20)
CO2: 23 mmol/L (ref 22–32)
Calcium: 9.4 mg/dL (ref 8.9–10.3)
Chloride: 97 mmol/L — ABNORMAL LOW (ref 98–111)
Creatinine, Ser: 0.69 mg/dL (ref 0.44–1.00)
GFR, Estimated: >60 mL/min (ref >60)
Glucose, Bld: 71 mg/dL (ref 70–99)
Potassium: 4.1 mmol/L (ref 3.5–5.1)
Sodium: 133 mmol/L — ABNORMAL LOW (ref 135–145)
Total Bilirubin: 0.7 mg/dL (ref 0.3–1.2)
Total Protein: 7.3 g/dL (ref 6.5–8.1)

## 2022-09-22 LAB — CBC
HCT: 38.4 % (ref 36.0–46.0)
Hemoglobin: 13 g/dL (ref 12.0–15.0)
MCH: 28.5 pg (ref 26.0–34.0)
MCHC: 33.9 g/dL (ref 30.0–36.0)
MCV: 84.2 fL (ref 80.0–100.0)
Platelets: 335 10*3/uL (ref 150–400)
RBC: 4.56 MIL/uL (ref 3.87–5.11)
RDW: 13.6 % (ref 11.5–15.5)
WBC: 7.9 10*3/uL (ref 4.0–10.5)
nRBC: 0 % (ref 0.0–0.2)

## 2022-09-22 LAB — URINALYSIS, ROUTINE W REFLEX MICROSCOPIC
Glucose, UA: NEGATIVE mg/dL
Hgb urine dipstick: NEGATIVE
Ketones, ur: >80 mg/dL — AB
Nitrite: NEGATIVE
Protein, ur: 30 mg/dL — AB
Specific Gravity, Urine: 1.015 (ref 1.005–1.030)
pH: 7.5 (ref 5.0–8.0)

## 2022-09-22 LAB — FETAL FIBRONECTIN: Fetal Fibronectin: NEGATIVE

## 2022-09-22 MED ORDER — LACTATED RINGERS IV BOLUS
1000.0000 mL | Freq: Once | INTRAVENOUS | Status: AC
  Administered 2022-09-22: 1000 mL via INTRAVENOUS

## 2022-09-22 MED ORDER — METOCLOPRAMIDE HCL 10 MG PO TABS: 10.0000 mg | ORAL_TABLET | Freq: Four times a day (QID) | ORAL | 3 refills | Status: DC | PRN

## 2022-09-22 NOTE — MAU Note (Signed)
IV team at bedside 

## 2022-09-22 NOTE — MAU Note (Signed)
No active vomiting or retching since arrived.  Pt is contracting.  Maternal pulse up to 150 noted prior to onset of contraction.  Pt reports does feel it race at time.  No SOB noted, pt calm and relaxed.

## 2022-09-22 NOTE — MAU Note (Addendum)
Sabrina Sutton is a 27 y.o. at 44w1dhere in MAU reporting: EMS arrival.  C/o nausea, vomiting(ongoing problem) and abd pain.  No bleeding or leaking. CBG 72 per EMS reported check.  Reports contractions off and on.   Onset of complaint: ongoing, got bad the last couple of days Pain score: none since arrived Vitals:   09/22/22 0855 09/22/22 0856  BP:  118/84  Pulse:  (!) 107  Resp:  16  Temp:  97.6 F (36.4 C)  SpO2: 100%      FHT:128 Lab orders placed from triage:  urine

## 2022-09-22 NOTE — MAU Provider Note (Signed)
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 or reversed  end-diastolic flow.  Due to fetal growth restriction, we will continue to follow her  with weekly fetal testing and umbilical artery Doppler studies.  She will return in 1 week for another BPP and umbilical artery  Doppler study. ----------------------------------------------------------------------                   Johnell Comings, MD Electronically Signed Final Report   09/11/2022 03:11 pm ----------------------------------------------------------------------  MR ABDOMEN WO CONTRAST  Result Date: 09/04/2022 CLINICAL DATA:  Abdominal pain in  pregnancy. EXAM: MRI ABDOMEN AND PELVIS WITHOUT CONTRAST TECHNIQUE: Multiplanar multisequence MR imaging of the abdomen and pelvis was performed. No intravenous contrast was administered. COMPARISON:  Right upper quadrant sonogram 09/03/2022 in CT [facility] 11/24/2018 FINDINGS: COMBINED FINDINGS FOR BOTH MR ABDOMEN AND PELVIS Lower chest: Trace bilateral pleural effusions. Hepatobiliary: No mass or other parenchymal abnormality identified. Gallbladder is visualized and appears normal. Pancreas: No mass, inflammatory changes, or other parenchymal abnormality identified. Spleen:  Within normal limits in size and appearance. Adrenals/Urinary Tract: Normal adrenal glands. Normal appearance of the left kidney. Moderate right hydronephrosis. Moderate right hydroureter to the level of the aortic bifurcation where the to ureter appears compressed between the right psoas muscle and gravid uterus. The urinary bladder is unremarkable. Stomach/Bowel: Stomach appears normal. Large gravid uterus displaces the bowel loops into the upward and laterally within the abdomen. No dilated bowel loops identified. The appendix is not confidently identified. No pathologic dilatation of the large or small bowel loops.  No bowel wall thickening or inflammation noted. Vascular/Lymphatic: Normal appearance of the abdominal aorta. No abdominopelvic adenopathy identified. Reproductive: Enlarged gravid uterus.  No adnexal mass identified. Other: Small volume of free fluid noted within the abdomen and pelvis. Musculoskeletal: No suspicious bone lesions identified. Mild subcutaneous edema identified within the body wall. IMPRESSION: 1. No acute findings identified within the abdomen or pelvis. 2. The appendix is not confidently identified. 3. Moderate right hydronephrosis and hydroureter to the level of the aortic bifurcation where the ureter appears compressed between the right psoas muscle and gravid uterus. 4. Small volume of free fluid noted within the  abdomen and pelvis. 5. Trace bilateral pleural effusions. Electronically Signed   By: Kerby Moors M.D.   On: 09/04/2022 04:33   MR PELVIS WO CONTRAST  Result Date: 09/04/2022 CLINICAL DATA:  Abdominal pain in pregnancy. EXAM: MRI ABDOMEN AND PELVIS WITHOUT CONTRAST TECHNIQUE: Multiplanar multisequence MR imaging of the abdomen and pelvis was performed. No intravenous contrast was administered. COMPARISON:  Right upper quadrant sonogram 09/03/2022 in CT [facility] 11/24/2018 FINDINGS: COMBINED FINDINGS FOR BOTH MR ABDOMEN AND PELVIS Lower chest: Trace bilateral pleural effusions. Hepatobiliary: No mass or other parenchymal abnormality identified. Gallbladder is visualized and appears normal. Pancreas: No mass, inflammatory changes, or other parenchymal abnormality identified. Spleen:  Within normal limits in size and appearance. Adrenals/Urinary Tract: Normal adrenal glands. Normal appearance of the left kidney. Moderate right hydronephrosis. Moderate right hydroureter to the level of the aortic bifurcation where the to ureter appears compressed between the right psoas muscle and gravid uterus. The urinary bladder is unremarkable. Stomach/Bowel: Stomach appears normal. Large gravid uterus displaces the bowel loops into the upward and laterally within the abdomen. No dilated bowel loops identified. The appendix is not confidently identified. No pathologic dilatation of the large or small bowel loops. No bowel wall thickening or inflammation noted. Vascular/Lymphatic: Normal appearance of the abdominal aorta. No abdominopelvic adenopathy identified. Reproductive: Enlarged gravid uterus.  No adnexal mass identified. Other: Small volume of free fluid noted within the abdomen and pelvis. Musculoskeletal: No suspicious bone lesions identified. Mild subcutaneous edema identified within the body wall. IMPRESSION: 1. No acute findings identified within the abdomen or pelvis. 2. The appendix is not confidently identified. 3.  Moderate right hydronephrosis and hydroureter to the level of the aortic bifurcation where the ureter appears compressed between the right psoas muscle and gravid uterus. 4. Small volume of free fluid noted within the abdomen and pelvis. 5. Trace bilateral pleural effusions. Electronically Signed   By: Kerby Moors M.D.   On: 09/04/2022 04:33   US ABDOMEN LIMITED RUQ (LIVER/GB)  Addendum Date: 09/03/2022   ADDENDUM REPORT: 09/03/2022 18:43 ADDENDUM: There is a voice recognition error within the impression of the regional report. Impression point #1 should read, "Small stone in the gallbladder. NO sonographic evidence of acute cholecystitis." No additional changes. Updated report was discussed with Dr. Bobbye Morton by telephone at 6:40 p.m. on 09/03/2022 by Dr. Davina Poke. Electronically Signed   By: Davina Poke D.O.   On: 09/03/2022 18:43   Result Date: 09/03/2022 CLINICAL DATA:  Nausea and vomiting EXAM: ULTRASOUND ABDOMEN LIMITED RIGHT UPPER QUADRANT COMPARISON:  11/20/2020 FINDINGS: Gallbladder: Mobile 5 mm nonshadowing stone within the gallbladder. No biliary sludge or wall thickening visualized. No sonographic Murphy sign noted by sonographer. Common bile duct: Diameter: 3 mm. Liver: No focal lesion identified. Within normal limits in parenchymal echogenicity. Portal vein is patent on color Doppler imaging with normal direction of blood flow towards  the liver. Other: Moderate right hydronephrosis. Small amount of free fluid seen in the right upper quadrant. IMPRESSION: 1. Small stone in the gallbladder. Sonographic evidence of acute cholecystitis. 2. Moderate right hydronephrosis. 3. Small amount of free fluid in the right upper quadrant. Electronically Signed: By: Davina Poke D.O. On: 09/03/2022 16:01    MAU Course/MDM: Orders Placed This Encounter  Procedures   Urinalysis, Routine w reflex microscopic -Urine, Clean Catch   Urinalysis, Microscopic (reflex)   CBC   Comprehensive  metabolic panel   Fetal fibronectin   Discharge patient    Meds ordered this encounter  Medications   lactated ringers bolus 1,000 mL   metoCLOPramide (REGLAN) 10 MG tablet    Sig: Take 1 tablet (10 mg total) by mouth every 6 (six) hours as needed for nausea.    Dispense:  30 tablet    Refill:  3    Order Specific Question:   Supervising Provider    Answer:   [place] Mode [id number]     NST reviewed. Reactive NST. Initially, MHR sporatically elevated but resolved with IV fluids and contractions every 1-2 minutes became irregular and Q 10-15 minutes after IV fluids. Cervix 0.5 cm and 50% effaced with low station at -1 on exam but with resolved contraction pattern and pt reporting improved pain, no evidence of preterm labor today.  FFN also negative, supporting reduced chances of PTL. Offered Procardia  series to see if Q 10 - 15 minute contractions would resolve and pt declined and preferred hospital discharge.  No antiemetics given today but pt reports improved symptoms with IV fluids alone.   D/C with return precautions, renew Rx to Reglan, pt to continue scopolamine and Zofran PRN also as prescribed.  Stay hydrated.  Keep scheduled appts at [facility].     Assessment: 1. Anxiety   2. Supervision of high risk pregnancy, antepartum   3. Nausea and vomiting during pregnancy   4. Abdominal pain during pregnancy in third trimester   5. Threatened preterm labor, third trimester   6. [redacted] weeks gestation of pregnancy   7. Mild dehydration     Plan: Discharge home Labor precautions and fetal kick counts  Follow-up Information     [place] Follow up.   Why: As scheduled Contact information: [place] [phone] [place] [facility] Follow up.   Specialty: Obstetrics and Gynecology Why: As needed for emergencies Contact information: [address] [id number] [place] [phone]               Allergies as of 09/22/2022   No Known Allergies      Medication List     TAKE these medications    Accu-Chek Guide test strip Generic drug: glucose blood Use as instructed to monitor glucose 8 times daily (before meals, after meals) due to gestational diabetes.   Alcohol Pads 70 % Pads Use to wipe skin prior to insulin injections twice daily   Blood Pressure Kit Devi 1 kit by Does not apply route once a week.   cyclobenzaprine 10 MG tablet Commonly known as: FLEXERIL Take 1 tablet (10 mg total) by mouth 3 (three) times daily as needed for muscle spasms.   Dexcom G6 Receiver Devi USE AS DIRECTED   Dexcom G6 Sensor Misc Inject 1 applicator into the skin as directed. (change sensor every 10 days)   Dexcom G6  Restaurant manager, [employer] 1 DEVICE UNDER THE SKIN AS DIRECTED UP TO 8 TIMES WITH EACH NEW SENSOR   feeding supplement Liqd Take 237 mLs by mouth 2 (two) times daily between meals.   Gojji Weight Scale Misc 1 Device by Does not apply route every 30 (thirty) days.   hydrOXYzine 50 MG tablet Commonly known as: ATARAX Take 1 tablet (50 mg total) by mouth every 6 (six) hours as needed for nausea or vomiting.   insulin pump Soln Inject into the skin every 4 (four) hours.   metoCLOPramide 10 MG tablet Commonly known as: REGLAN Take 1 tablet (10 mg total) by mouth every 6 (six) hours as needed for nausea. What changed:  when to take this reasons to take this   Omnipod 5 G6 Pods (Gen 5) Misc 1 Units by Does not apply route every 3 (three) days.   ondansetron 4 MG disintegrating tablet Commonly known as: ZOFRAN-ODT Take 1 tablet (4 mg total) by mouth every 8 (eight) hours as needed for nausea or vomiting (second line after reglan).   OneTouch Verio Flex System w/Device Kit Use to monitor glucose 8 times daily before and after meals.   pantoprazole 40 MG tablet Commonly known as: PROTONIX Take 1 tablet (40 mg  total) by mouth daily.   potassium chloride SA 20 MEQ tablet Commonly known as: KLOR-CON M Take 1 tablet (20 mEq total) by mouth 2 (two) times daily.   multivitamin-prenatal 27-0.8 MG Tabs tablet Take 1 tablet by mouth daily at 12 noon.   Prenatal Vitamin 27-0.8 MG Tabs Take 1 tablet by mouth daily.   promethazine 25 MG tablet Commonly known as: PHENERGAN Take 1 tablet (25 mg total) by mouth every 6 (six) hours as needed for nausea or vomiting.   scopolamine 1 MG/3DAYS Commonly known as: TRANSDERM-SCOP Place 1 patch (1.5 mg total) onto the skin every 3 (three) days.   sertraline 50 MG tablet Commonly known as: Zoloft Take 1 tablet (50 mg total) by mouth daily.        Fatima Blank Certified Nurse-Midwife 09/22/2022 12:27 PM

## 2022-09-25 ENCOUNTER — Ambulatory Visit: Payer: Medicaid Other | Admitting: *Deleted

## 2022-09-25 ENCOUNTER — Other Ambulatory Visit: Payer: Self-pay | Admitting: *Deleted

## 2022-09-25 ENCOUNTER — Telehealth: Payer: Self-pay

## 2022-09-25 ENCOUNTER — Ambulatory Visit: Payer: Medicaid Other | Attending: Maternal & Fetal Medicine

## 2022-09-25 ENCOUNTER — Other Ambulatory Visit (HOSPITAL_BASED_OUTPATIENT_CLINIC_OR_DEPARTMENT_OTHER): Payer: Self-pay | Admitting: Advanced Practice Midwife

## 2022-09-25 VITALS — BP 128/82 | HR 92

## 2022-09-25 DIAGNOSIS — O365930 Maternal care for other known or suspected poor fetal growth, third trimester, not applicable or unspecified: Secondary | ICD-10-CM

## 2022-09-25 DIAGNOSIS — O0990 Supervision of high risk pregnancy, unspecified, unspecified trimester: Secondary | ICD-10-CM

## 2022-09-25 DIAGNOSIS — F419 Anxiety disorder, unspecified: Secondary | ICD-10-CM

## 2022-09-25 DIAGNOSIS — O99323 Drug use complicating pregnancy, third trimester: Secondary | ICD-10-CM

## 2022-09-25 DIAGNOSIS — F1290 Cannabis use, unspecified, uncomplicated: Secondary | ICD-10-CM

## 2022-09-25 DIAGNOSIS — E109 Type 1 diabetes mellitus without complications: Secondary | ICD-10-CM

## 2022-09-25 DIAGNOSIS — O24013 Pre-existing diabetes mellitus, type 1, in pregnancy, third trimester: Secondary | ICD-10-CM | POA: Diagnosis not present

## 2022-09-25 DIAGNOSIS — Z3689 Encounter for other specified antenatal screening: Secondary | ICD-10-CM | POA: Insufficient documentation

## 2022-09-25 DIAGNOSIS — Z3A32 32 weeks gestation of pregnancy: Secondary | ICD-10-CM

## 2022-09-25 DIAGNOSIS — O210 Mild hyperemesis gravidarum: Secondary | ICD-10-CM

## 2022-09-25 NOTE — Telephone Encounter (Signed)
-----   Message from Radene Gunning, MD sent at 09/01/2022 11:13 AM EST ----- Regarding: Infusions Hi Warrene Kapfer,   For this patient, can you arrange for her to get IV fluid infusions a couple times a week? There doesn't seem to be an order set (Dr. Dione Plover has tried to look) so I think we need to call. She would just need 2 times a week NS+49mq Kcl 500 at a rate of 200 cc/hr (if pt able to tolerate).   She has been hospitalized a bunch for dehydration and likely at this point if she did this a couple times a week it might keep her out of the hospital.   Thanks so much!   PNevin Bloodgood

## 2022-09-25 NOTE — Telephone Encounter (Signed)
I have included Aneetra to work on getting appt scheduled.   Junious Silk states that the hold up is the orders not being placed. Will connect with Dr. Elly Modena today regarding orders.

## 2022-09-26 ENCOUNTER — Encounter: Payer: Self-pay | Admitting: Certified Nurse Midwife

## 2022-10-01 ENCOUNTER — Inpatient Hospital Stay (HOSPITAL_COMMUNITY)
Admission: AD | Admit: 2022-10-01 | Discharge: 2022-10-01 | Disposition: A | Discharge status: Home / Self Care | Payer: Medicaid Other | Attending: Obstetrics & Gynecology | Admitting: Obstetrics & Gynecology

## 2022-10-01 ENCOUNTER — Encounter (HOSPITAL_COMMUNITY): Payer: Self-pay | Admitting: Obstetrics & Gynecology

## 2022-10-01 DIAGNOSIS — R1084 Generalized abdominal pain: Secondary | ICD-10-CM | POA: Diagnosis not present

## 2022-10-01 DIAGNOSIS — O212 Late vomiting of pregnancy: Secondary | ICD-10-CM | POA: Insufficient documentation

## 2022-10-01 DIAGNOSIS — R1030 Lower abdominal pain, unspecified: Secondary | ICD-10-CM | POA: Diagnosis not present

## 2022-10-01 DIAGNOSIS — O24113 Pre-existing diabetes mellitus, type 2, in pregnancy, third trimester: Secondary | ICD-10-CM | POA: Diagnosis not present

## 2022-10-01 DIAGNOSIS — Z3A33 33 weeks gestation of pregnancy: Secondary | ICD-10-CM | POA: Insufficient documentation

## 2022-10-01 DIAGNOSIS — R1111 Vomiting without nausea: Secondary | ICD-10-CM | POA: Diagnosis not present

## 2022-10-01 DIAGNOSIS — O26893 Other specified pregnancy related conditions, third trimester: Secondary | ICD-10-CM | POA: Insufficient documentation

## 2022-10-01 DIAGNOSIS — O219 Vomiting of pregnancy, unspecified: Secondary | ICD-10-CM

## 2022-10-01 DIAGNOSIS — R000 Tachycardia, unspecified: Secondary | ICD-10-CM | POA: Diagnosis not present

## 2022-10-01 LAB — URINALYSIS, ROUTINE W REFLEX MICROSCOPIC
Bacteria, UA: NONE SEEN
Bilirubin Urine: NEGATIVE
Glucose, UA: NEGATIVE mg/dL
Hgb urine dipstick: NEGATIVE
Ketones, ur: NEGATIVE mg/dL
Leukocytes,Ua: NEGATIVE
Nitrite: NEGATIVE
Protein, ur: 30 mg/dL — AB
Specific Gravity, Urine: 1.019 (ref 1.005–1.030)
pH: 7 (ref 5.0–8.0)

## 2022-10-01 LAB — WET PREP, GENITAL
Clue Cells Wet Prep HPF POC: NONE SEEN
Sperm: NONE SEEN
Trich, Wet Prep: NONE SEEN
WBC, Wet Prep HPF POC: >=10 — AB (ref <10)
Yeast Wet Prep HPF POC: NONE SEEN

## 2022-10-01 MED ORDER — ONDANSETRON 4 MG PO TBDP
4.0000 mg | ORAL_TABLET | Freq: Once | ORAL | Status: AC
  Administered 2022-10-01: 4 mg via ORAL
  Filled 2022-10-01: qty 1

## 2022-10-01 NOTE — MAU Note (Signed)
Sabrina Sutton is a 27 y.o. at 53w3dhere in MAU reporting: pt arrived by EMS, ongoing N/V. No bleeding or leaking.  Reports +FM. CBG per EMS 167  Onset of complaint: 0300 Pain score: 6-8, lower abd /up sides There were no vitals filed for this visit.   [id number] this time, pt being registered Lab orders placed from triage:  UA
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
## 2022-10-02 ENCOUNTER — Ambulatory Visit: Payer: Medicaid Other

## 2022-10-02 ENCOUNTER — Ambulatory Visit: Payer: Medicaid Other | Attending: Maternal & Fetal Medicine

## 2022-10-02 LAB — CULTURE, OB URINE: Culture: NO GROWTH

## 2022-10-04 ENCOUNTER — Ambulatory Visit: Payer: Medicaid Other | Admitting: Obstetrics & Gynecology

## 2022-10-04 VITALS — BP 115/79 | HR 132 | Wt 133.0 lb

## 2022-10-04 DIAGNOSIS — O0990 Supervision of high risk pregnancy, unspecified, unspecified trimester: Secondary | ICD-10-CM

## 2022-10-04 DIAGNOSIS — O0993 Supervision of high risk pregnancy, unspecified, third trimester: Secondary | ICD-10-CM

## 2022-10-04 DIAGNOSIS — E108 Type 1 diabetes mellitus with unspecified complications: Secondary | ICD-10-CM

## 2022-10-04 DIAGNOSIS — Z3A33 33 weeks gestation of pregnancy: Secondary | ICD-10-CM

## 2022-10-04 NOTE — Progress Notes (Signed)
   PRENATAL VISIT NOTE  Subjective:  Sabrina Sutton is a 27 y.o. G1P0 at [redacted]w[redacted]d being seen today for ongoing prenatal care.  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  Patient reports no complaints.  Contractions: Not present. Vag. Bleeding: None.  Movement: Present. Denies leaking of fluid.   The following portions of the patient's history were reviewed and updated as appropriate: allergies, current medications, past family history, past medical history, past social history, past surgical history and problem list.   Objective:   Vitals:   10/04/22 1120  BP: 115/79  Pulse: (!) 132  Weight: 60.3 kg    Fetal Status: Fetal Heart Rate (bpm): 150   Movement: Present     General:  Alert, oriented and cooperative. Patient is in no acute distress.  Skin: Skin is warm and dry. No rash noted.   Cardiovascular: Normal heart rate noted  Respiratory: Normal respiratory effort, no problems with respiration noted  Abdomen: Soft, gravid, appropriate for gestational age.  Pain/Pressure: Absent     Pelvic: Cervical exam deferred        Extremities: Normal range of motion.  Edema: None  Mental Status: Normal mood and affect. Normal behavior. Normal judgment and thought content.   Assessment and Plan:  Pregnancy: G1P0 at [redacted]w[redacted]d 1. Supervision of high risk pregnancy, antepartum Followed by MFM anticipate delivery at 37 weeks  2. Type 1 diabetes mellitus with complications (HCC) Followed by endocrine, pp BG up to 170, Dexcom and insulin pump  Preterm labor symptoms and general obstetric precautions including but not  limited to vaginal bleeding, contractions, leaking of fluid and fetal movement were reviewed in detail with the patient. Please refer to After Visit Summary for other counseling recommendations.   Return in about 2 weeks (around 10/18/2022).  Future Appointments  Date Time Provider [place]  10/09/2022 12:30 PM [facility] NURSE [facility]  10/09/2022 12:45 PM [facility] US4 [facility]  10/16/2022 10:30 AM [facility] NURSE [facility]  10/16/2022 10:45 AM [facility] US4 [facility]  10/18/2022 11:15 AM Chancy Milroy, MD [place] [facility]  10/23/2022  7:15 AM [facility] NURSE [facility]  10/23/2022  7:30 AM [facility] US2 [facility]  10/30/2022 11:15 AM [facility] NURSE [facility]  10/30/2022 11:30 AM [facility] US2 [facility] [place]    Emeterio Reeve, MD

## 2022-10-08 ENCOUNTER — Inpatient Hospital Stay (HOSPITAL_COMMUNITY)
Admission: AD | Admit: 2022-10-08 | Discharge: 2022-10-08 | Disposition: A | Discharge status: Home / Self Care | Payer: Medicaid Other | Attending: Obstetrics & Gynecology | Admitting: Obstetrics & Gynecology

## 2022-10-08 ENCOUNTER — Inpatient Hospital Stay (HOSPITAL_COMMUNITY): Admit: 2022-10-08 | Payer: Medicaid Other

## 2022-10-08 ENCOUNTER — Encounter (HOSPITAL_COMMUNITY): Payer: Self-pay | Admitting: Obstetrics & Gynecology

## 2022-10-08 DIAGNOSIS — O0993 Supervision of high risk pregnancy, unspecified, third trimester: Secondary | ICD-10-CM | POA: Insufficient documentation

## 2022-10-08 DIAGNOSIS — O26893 Other specified pregnancy related conditions, third trimester: Secondary | ICD-10-CM | POA: Insufficient documentation

## 2022-10-08 DIAGNOSIS — O24113 Pre-existing diabetes mellitus, type 2, in pregnancy, third trimester: Secondary | ICD-10-CM | POA: Diagnosis present

## 2022-10-08 DIAGNOSIS — O479 False labor, unspecified: Secondary | ICD-10-CM

## 2022-10-08 DIAGNOSIS — Z3A34 34 weeks gestation of pregnancy: Secondary | ICD-10-CM | POA: Insufficient documentation

## 2022-10-08 DIAGNOSIS — O365930 Maternal care for other known or suspected poor fetal growth, third trimester, not applicable or unspecified: Secondary | ICD-10-CM | POA: Insufficient documentation

## 2022-10-08 DIAGNOSIS — R030 Elevated blood-pressure reading, without diagnosis of hypertension: Secondary | ICD-10-CM | POA: Insufficient documentation

## 2022-10-08 DIAGNOSIS — Z3689 Encounter for other specified antenatal screening: Secondary | ICD-10-CM

## 2022-10-08 LAB — URINALYSIS, ROUTINE W REFLEX MICROSCOPIC
Bilirubin Urine: NEGATIVE
Glucose, UA: NEGATIVE mg/dL
Hgb urine dipstick: NEGATIVE
Ketones, ur: 5 mg/dL — AB
Nitrite: NEGATIVE
Protein, ur: NEGATIVE mg/dL
Specific Gravity, Urine: 1.008 (ref 1.005–1.030)
pH: 7 (ref 5.0–8.0)

## 2022-10-08 LAB — POCT FERN TEST: POCT Fern Test: NEGATIVE

## 2022-10-08 LAB — AMNISURE RUPTURE OF MEMBRANE (ROM) NOT AT ARMC: Amnisure ROM: NEGATIVE

## 2022-10-08 MED ORDER — NIFEDIPINE 10 MG PO CAPS
10.0000 mg | ORAL_CAPSULE | ORAL | Status: AC | PRN
  Administered 2022-10-08 (×3): 10 mg via ORAL
  Filled 2022-10-08 (×3): qty 1

## 2022-10-08 MED ORDER — LACTATED RINGERS IV BOLUS: 1000.0000 mL | Freq: Once | INTRAVENOUS | Status: DC
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
## 2022-10-08 NOTE — MAU Note (Signed)
.  Sabrina Sutton is a 27 y.o. at [redacted]w[redacted]d here in MAU reporting: x2 emesis occurrences today at 0900 and around 1520, reports gush of clear fluid with emesis occurrences, patient reports not currently wearing a pad and denies continued trickling of fluid. Patient reports reports taking zofran and reglan for n/v, reports absence of nausea at this time.   Patient reports contractions every 3-5 minutes starting at around 0900 this AM as well.   Patient denies VB and endorses +FM.   Onset of complaint: 0900 Pain score: ctxn lower abd 8/10  Vitals:   10/08/22 1550  BP: 114/73  Pulse: (!) 122  Resp: 18  Temp: 98.6 F (37 C)     FHT:138 Lab orders placed from triage:  n/a

## 2022-10-09 ENCOUNTER — Ambulatory Visit: Payer: Medicaid Other

## 2022-10-09 ENCOUNTER — Ambulatory Visit: Payer: Medicaid Other | Attending: Maternal & Fetal Medicine

## 2022-10-11 ENCOUNTER — Other Ambulatory Visit: Payer: Self-pay | Admitting: Emergency Medicine

## 2022-10-11 MED ORDER — CYCLOBENZAPRINE HCL 10 MG PO TABS: 10.0000 mg | ORAL_TABLET | Freq: Three times a day (TID) | ORAL | 0 refills | Status: DC | PRN

## 2022-10-15 ENCOUNTER — Inpatient Hospital Stay (HOSPITAL_COMMUNITY)
Admission: AD | Admit: 2022-10-15 | Discharge: 2022-10-22 | DRG: 786 | Disposition: A | Discharge status: Home / Self Care | Payer: Medicaid Other | Attending: Obstetrics & Gynecology | Admitting: Obstetrics & Gynecology

## 2022-10-15 ENCOUNTER — Encounter (HOSPITAL_COMMUNITY): Payer: Self-pay | Admitting: Obstetrics & Gynecology

## 2022-10-15 ENCOUNTER — Other Ambulatory Visit: Payer: Self-pay

## 2022-10-15 DIAGNOSIS — O210 Mild hyperemesis gravidarum: Secondary | ICD-10-CM

## 2022-10-15 DIAGNOSIS — O0990 Supervision of high risk pregnancy, unspecified, unspecified trimester: Secondary | ICD-10-CM

## 2022-10-15 DIAGNOSIS — O4703 False labor before 37 completed weeks of gestation, third trimester: Secondary | ICD-10-CM | POA: Diagnosis not present

## 2022-10-15 DIAGNOSIS — Z87891 Personal history of nicotine dependence: Secondary | ICD-10-CM

## 2022-10-15 DIAGNOSIS — Z9641 Presence of insulin pump (external) (internal): Secondary | ICD-10-CM | POA: Diagnosis present

## 2022-10-15 DIAGNOSIS — O9832 Other infections with a predominantly sexual mode of transmission complicating childbirth: Secondary | ICD-10-CM | POA: Diagnosis present

## 2022-10-15 DIAGNOSIS — R110 Nausea: Secondary | ICD-10-CM | POA: Diagnosis not present

## 2022-10-15 DIAGNOSIS — R1084 Generalized abdominal pain: Secondary | ICD-10-CM | POA: Diagnosis not present

## 2022-10-15 DIAGNOSIS — Z3A35 35 weeks gestation of pregnancy: Secondary | ICD-10-CM | POA: Diagnosis not present

## 2022-10-15 DIAGNOSIS — O99344 Other mental disorders complicating childbirth: Secondary | ICD-10-CM | POA: Diagnosis present

## 2022-10-15 DIAGNOSIS — Z23 Encounter for immunization: Secondary | ICD-10-CM

## 2022-10-15 DIAGNOSIS — O133 Gestational [pregnancy-induced] hypertension without significant proteinuria, third trimester: Secondary | ICD-10-CM | POA: Diagnosis not present

## 2022-10-15 DIAGNOSIS — F419 Anxiety disorder, unspecified: Secondary | ICD-10-CM | POA: Diagnosis present

## 2022-10-15 DIAGNOSIS — E109 Type 1 diabetes mellitus without complications: Secondary | ICD-10-CM | POA: Diagnosis present

## 2022-10-15 DIAGNOSIS — Z30430 Encounter for insertion of intrauterine contraceptive device: Secondary | ICD-10-CM

## 2022-10-15 DIAGNOSIS — O26893 Other specified pregnancy related conditions, third trimester: Secondary | ICD-10-CM | POA: Diagnosis present

## 2022-10-15 DIAGNOSIS — R112 Nausea with vomiting, unspecified: Secondary | ICD-10-CM | POA: Diagnosis not present

## 2022-10-15 DIAGNOSIS — R030 Elevated blood-pressure reading, without diagnosis of hypertension: Secondary | ICD-10-CM | POA: Diagnosis present

## 2022-10-15 DIAGNOSIS — R58 Hemorrhage, not elsewhere classified: Secondary | ICD-10-CM | POA: Diagnosis not present

## 2022-10-15 DIAGNOSIS — E108 Type 1 diabetes mellitus with unspecified complications: Secondary | ICD-10-CM | POA: Diagnosis not present

## 2022-10-15 DIAGNOSIS — Z98891 History of uterine scar from previous surgery: Secondary | ICD-10-CM

## 2022-10-15 DIAGNOSIS — O2402 Pre-existing diabetes mellitus, type 1, in childbirth: Secondary | ICD-10-CM | POA: Diagnosis present

## 2022-10-15 DIAGNOSIS — Z794 Long term (current) use of insulin: Secondary | ICD-10-CM

## 2022-10-15 DIAGNOSIS — A6000 Herpesviral infection of urogenital system, unspecified: Secondary | ICD-10-CM | POA: Diagnosis present

## 2022-10-15 DIAGNOSIS — R000 Tachycardia, unspecified: Secondary | ICD-10-CM | POA: Diagnosis not present

## 2022-10-15 LAB — COMPREHENSIVE METABOLIC PANEL
ALT: 13 U/L (ref 0–44)
AST: 26 U/L (ref 15–41)
Albumin: 2.9 g/dL — ABNORMAL LOW (ref 3.5–5.0)
Alkaline Phosphatase: 184 U/L — ABNORMAL HIGH (ref 38–126)
Anion gap: 11 (ref 5–15)
BUN: 7 mg/dL (ref 6–20)
CO2: 20 mmol/L — ABNORMAL LOW (ref 22–32)
Calcium: 8.6 mg/dL — ABNORMAL LOW (ref 8.9–10.3)
Chloride: 101 mmol/L (ref 98–111)
Creatinine, Ser: 0.66 mg/dL (ref 0.44–1.00)
GFR, Estimated: >60 mL/min (ref >60)
Glucose, Bld: 145 mg/dL — ABNORMAL HIGH (ref 70–99)
Potassium: 4.2 mmol/L (ref 3.5–5.1)
Sodium: 132 mmol/L — ABNORMAL LOW (ref 135–145)
Total Bilirubin: 0.6 mg/dL (ref 0.3–1.2)
Total Protein: 6.6 g/dL (ref 6.5–8.1)

## 2022-10-15 LAB — PROTEIN / CREATININE RATIO, URINE
Creatinine, Urine: 64 mg/dL
Protein Creatinine Ratio: 0.19 mg/mg{Cre} — ABNORMAL HIGH (ref 0.00–0.15)
Total Protein, Urine: 12 mg/dL

## 2022-10-15 LAB — GLUCOSE, CAPILLARY
Glucose-Capillary: 154 mg/dL — ABNORMAL HIGH (ref 70–99)
Glucose-Capillary: 114 mg/dL — ABNORMAL HIGH (ref 70–99)
Glucose-Capillary: 163 mg/dL — ABNORMAL HIGH (ref 70–99)
Glucose-Capillary: 174 mg/dL — ABNORMAL HIGH (ref 70–99)

## 2022-10-15 LAB — URINALYSIS, ROUTINE W REFLEX MICROSCOPIC
Bilirubin Urine: NEGATIVE
Glucose, UA: NEGATIVE mg/dL
Hgb urine dipstick: NEGATIVE
Ketones, ur: NEGATIVE mg/dL
Leukocytes,Ua: NEGATIVE
Nitrite: NEGATIVE
Protein, ur: NEGATIVE mg/dL
Specific Gravity, Urine: 1.009 (ref 1.005–1.030)
pH: 6 (ref 5.0–8.0)

## 2022-10-15 LAB — CBC
HCT: 35.8 % — ABNORMAL LOW (ref 36.0–46.0)
Hemoglobin: 12 g/dL (ref 12.0–15.0)
MCH: 28.4 pg (ref 26.0–34.0)
MCHC: 33.5 g/dL (ref 30.0–36.0)
MCV: 84.8 fL (ref 80.0–100.0)
Platelets: 300 10*3/uL (ref 150–400)
RBC: 4.22 MIL/uL (ref 3.87–5.11)
RDW: 14.2 % (ref 11.5–15.5)
WBC: 9.9 10*3/uL (ref 4.0–10.5)
nRBC: 0 % (ref 0.0–0.2)

## 2022-10-15 LAB — BETA-HYDROXYBUTYRIC ACID: Beta-Hydroxybutyric Acid: 0.73 mmol/L — ABNORMAL HIGH (ref 0.05–0.27)

## 2022-10-15 MED ORDER — FAMOTIDINE IN NACL 20-0.9 MG/50ML-% IV SOLN
20.0000 mg | Freq: Once | INTRAVENOUS | Status: DC
  Filled 2022-10-15: qty 50

## 2022-10-15 MED ORDER — LACTATED RINGERS IV BOLUS: 1000.0000 mL | Freq: Once | INTRAVENOUS | Status: DC

## 2022-10-15 MED ORDER — LACTATED RINGERS IV SOLN: 125.0000 mL/h | INTRAVENOUS | Status: DC

## 2022-10-15 MED ORDER — PROMETHAZINE HCL 25 MG RE SUPP
12.5000 mg | RECTAL | Status: DC | PRN
  Filled 2022-10-15: qty 1

## 2022-10-15 MED ORDER — PRENATAL MULTIVITAMIN CH
1.0000 | ORAL_TABLET | Freq: Every day | ORAL | Status: DC
  Administered 2022-10-17: 1 via ORAL
  Filled 2022-10-15: qty 1

## 2022-10-15 MED ORDER — SODIUM CHLORIDE 0.9 % IV SOLN
25.0000 mg | Freq: Once | INTRAVENOUS | Status: DC
  Filled 2022-10-15: qty 1

## 2022-10-15 MED ORDER — INSULIN PUMP
Freq: Three times a day (TID) | SUBCUTANEOUS | Status: DC
  Administered 2022-10-15: 0.6 via SUBCUTANEOUS
  Administered 2022-10-16: 2 via SUBCUTANEOUS
  Administered 2022-10-18: 3.2 via SUBCUTANEOUS
  Filled 2022-10-15: qty 1

## 2022-10-15 MED ORDER — FENTANYL CITRATE (PF) 100 MCG/2ML IJ SOLN
50.0000 ug | Freq: Once | INTRAMUSCULAR | Status: AC
  Administered 2022-10-15: 50 ug via INTRAVENOUS
  Filled 2022-10-15: qty 2

## 2022-10-15 MED ORDER — ONDANSETRON HCL 4 MG/2ML IJ SOLN
4.0000 mg | Freq: Three times a day (TID) | INTRAMUSCULAR | Status: DC
  Administered 2022-10-15 – 2022-10-18 (×9): 4 mg via INTRAVENOUS
  Filled 2022-10-15 (×7): qty 2

## 2022-10-15 MED ORDER — CALCIUM CARBONATE ANTACID 500 MG PO CHEW: 2.0000 | CHEWABLE_TABLET | ORAL | Status: DC | PRN

## 2022-10-15 MED ORDER — DOCUSATE SODIUM 100 MG PO CAPS
100.0000 mg | ORAL_CAPSULE | Freq: Every day | ORAL | Status: DC
  Administered 2022-10-17 – 2022-10-18 (×2): 100 mg via ORAL
  Filled 2022-10-15 (×2): qty 1

## 2022-10-15 MED ORDER — ONDANSETRON 4 MG PO TBDP: 4.0000 mg | ORAL_TABLET | Freq: Three times a day (TID) | ORAL | Status: DC | PRN

## 2022-10-15 MED ORDER — HYDROXYZINE HCL 50 MG PO TABS: 50.0000 mg | ORAL_TABLET | Freq: Four times a day (QID) | ORAL | Status: DC | PRN

## 2022-10-15 MED ORDER — HYDROXYZINE HCL 50 MG/ML IM SOLN
50.0000 mg | Freq: Four times a day (QID) | INTRAMUSCULAR | Status: DC | PRN
  Administered 2022-10-16 (×2): 50 mg via INTRAMUSCULAR
  Filled 2022-10-15 (×3): qty 1

## 2022-10-15 MED ORDER — LACTATED RINGERS IV BOLUS
1000.0000 mL | Freq: Once | INTRAVENOUS | Status: AC
  Administered 2022-10-15: 1000 mL via INTRAVENOUS

## 2022-10-15 MED ORDER — INSULIN PUMP
Freq: Three times a day (TID) | SUBCUTANEOUS | Status: DC
  Filled 2022-10-15: qty 1

## 2022-10-15 MED ORDER — SCOPOLAMINE 1 MG/3DAYS TD PT72
1.0000 | MEDICATED_PATCH | Freq: Once | TRANSDERMAL | Status: AC
  Administered 2022-10-15: 1.5 mg via TRANSDERMAL
  Filled 2022-10-15: qty 1

## 2022-10-15 MED ORDER — LACTATED RINGERS IV SOLN: INTRAVENOUS | Status: DC

## 2022-10-15 MED ORDER — FAMOTIDINE IN NACL 20-0.9 MG/50ML-% IV SOLN
20.0000 mg | Freq: Once | INTRAVENOUS | Status: AC
  Administered 2022-10-15: 20 mg via INTRAVENOUS

## 2022-10-15 MED ORDER — INSULIN ASPART 100 UNIT/ML IJ SOLN
2.0000 [IU] | Freq: Once | INTRAMUSCULAR | Status: AC
  Administered 2022-10-15: 2 [IU] via SUBCUTANEOUS

## 2022-10-15 MED ORDER — SODIUM CHLORIDE 0.9 % IV SOLN
25.0000 mg | Freq: Once | INTRAVENOUS | Status: AC
  Administered 2022-10-15: 25 mg via INTRAVENOUS
  Filled 2022-10-15: qty 1

## 2022-10-15 MED ORDER — SODIUM CHLORIDE 0.9 % IV SOLN: 8.0000 mg | Freq: Three times a day (TID) | INTRAVENOUS | Status: DC | PRN

## 2022-10-15 MED ORDER — PROMETHAZINE HCL 25 MG PO TABS
12.5000 mg | ORAL_TABLET | ORAL | Status: DC | PRN
  Administered 2022-10-15: 25 mg via ORAL
  Filled 2022-10-15: qty 1

## 2022-10-15 MED ORDER — METOCLOPRAMIDE HCL 5 MG/ML IJ SOLN
5.0000 mg | Freq: Two times a day (BID) | INTRAMUSCULAR | Status: DC
  Administered 2022-10-15 – 2022-10-18 (×6): 5 mg via INTRAVENOUS
  Filled 2022-10-15 (×6): qty 2

## 2022-10-15 MED ORDER — NIFEDIPINE ER OSMOTIC RELEASE 30 MG PO TB24
30.0000 mg | ORAL_TABLET | Freq: Every day | ORAL | Status: DC
  Administered 2022-10-16 – 2022-10-18 (×3): 30 mg via ORAL
  Filled 2022-10-15 (×3): qty 1

## 2022-10-15 MED ORDER — ONDANSETRON 4 MG PO TBDP
8.0000 mg | ORAL_TABLET | Freq: Once | ORAL | Status: AC
  Administered 2022-10-15: 8 mg via ORAL
  Filled 2022-10-15: qty 2

## 2022-10-15 MED ORDER — ONDANSETRON HCL 4 MG/2ML IJ SOLN
4.0000 mg | Freq: Three times a day (TID) | INTRAMUSCULAR | Status: DC | PRN
  Filled 2022-10-15: qty 2

## 2022-10-15 MED ORDER — ACETAMINOPHEN 325 MG PO TABS
650.0000 mg | ORAL_TABLET | ORAL | Status: DC | PRN
  Administered 2022-10-15 – 2022-10-16 (×3): 650 mg via ORAL
  Filled 2022-10-15 (×3): qty 2

## 2022-10-15 NOTE — MAU Note (Addendum)
Reports ongoing nausea and abdominal pain, worsening since this morning. Reports emesis x5 in last 24 hours, with last episode between 0800 and 0900 this morning. Reports last meal was around 1900 or 2000 last night - pizza rolls. Endorses having food in home, but denies an appetite to eat. Reports difficulty drinking/tolerating PO fluids. RN does observe light yellow, clear urine in specimen cup. Reports CBG 120 this morning around 0800.
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
## 2022-10-16 ENCOUNTER — Ambulatory Visit: Payer: Medicaid Other

## 2022-10-16 ENCOUNTER — Inpatient Hospital Stay (HOSPITAL_BASED_OUTPATIENT_CLINIC_OR_DEPARTMENT_OTHER): Payer: Medicaid Other

## 2022-10-16 DIAGNOSIS — O24013 Pre-existing diabetes mellitus, type 1, in pregnancy, third trimester: Secondary | ICD-10-CM

## 2022-10-16 DIAGNOSIS — O99323 Drug use complicating pregnancy, third trimester: Secondary | ICD-10-CM | POA: Diagnosis not present

## 2022-10-16 DIAGNOSIS — Z3A35 35 weeks gestation of pregnancy: Secondary | ICD-10-CM

## 2022-10-16 DIAGNOSIS — O365930 Maternal care for other known or suspected poor fetal growth, third trimester, not applicable or unspecified: Secondary | ICD-10-CM

## 2022-10-16 DIAGNOSIS — O210 Mild hyperemesis gravidarum: Secondary | ICD-10-CM

## 2022-10-16 DIAGNOSIS — F1290 Cannabis use, unspecified, uncomplicated: Secondary | ICD-10-CM

## 2022-10-16 DIAGNOSIS — E109 Type 1 diabetes mellitus without complications: Secondary | ICD-10-CM | POA: Diagnosis not present

## 2022-10-16 LAB — BASIC METABOLIC PANEL
Anion gap: 10 (ref 5–15)
BUN: 8 mg/dL (ref 6–20)
CO2: 20 mmol/L — ABNORMAL LOW (ref 22–32)
Calcium: 7.9 mg/dL — ABNORMAL LOW (ref 8.9–10.3)
Chloride: 104 mmol/L (ref 98–111)
Creatinine, Ser: 0.76 mg/dL (ref 0.44–1.00)
GFR, Estimated: >60 mL/min (ref >60)
Glucose, Bld: 75 mg/dL (ref 70–99)
Potassium: 3.1 mmol/L — ABNORMAL LOW (ref 3.5–5.1)
Sodium: 134 mmol/L — ABNORMAL LOW (ref 135–145)

## 2022-10-16 LAB — GLUCOSE, CAPILLARY
Glucose-Capillary: 130 mg/dL — ABNORMAL HIGH (ref 70–99)
Glucose-Capillary: 71 mg/dL (ref 70–99)
Glucose-Capillary: 102 mg/dL — ABNORMAL HIGH (ref 70–99)
Glucose-Capillary: 132 mg/dL — ABNORMAL HIGH (ref 70–99)
Glucose-Capillary: 70 mg/dL (ref 70–99)
Glucose-Capillary: 56 mg/dL — ABNORMAL LOW (ref 70–99)
Glucose-Capillary: 97 mg/dL (ref 70–99)

## 2022-10-16 MED ORDER — PANTOPRAZOLE SODIUM 40 MG IV SOLR
40.0000 mg | INTRAVENOUS | Status: DC
  Administered 2022-10-16 – 2022-10-17 (×2): 40 mg via INTRAVENOUS
  Filled 2022-10-16 (×3): qty 10

## 2022-10-16 MED ORDER — CYCLOBENZAPRINE HCL 10 MG PO TABS
10.0000 mg | ORAL_TABLET | Freq: Three times a day (TID) | ORAL | Status: DC | PRN
  Administered 2022-10-16: 10 mg via ORAL
  Filled 2022-10-16: qty 1

## 2022-10-16 NOTE — Inpatient Diabetes Management (Signed)
Inpatient Diabetes Program Recommendations  ADA Standards of Care 2023 Diabetes in Pregnancy Target Glucose Ranges:  Fasting: 70 - 95 mg/dL 1 hr postprandial:  110 - 140mg /dL (from first bite of meal) 2 hr postprandial:  100 - 120 mg/dL (from first bit of meal)    Lab Results  Component Value Date   GLUCAP 71 10/16/2022   HGBA1C 6.4 (H) 08/14/2022    Latest Reference Range & Units 10/15/22 18:40 10/15/22 22:19 10/16/22 02:14 10/16/22 06:22  Glucose-Capillary 70 - 99 mg/dL 163 (H) 174 (H) 130 (H) 71  (H): Data is abnormally high  Diabetes history: DM1 Outpatient Diabetes medications: Omnipod insulin pump with Dexcom CGM Current orders for Inpatient glycemic control: Insulin Pump  Inpatient Diabetes Program Recommendations:   Patient was just discharged home from the hospital 09/05/22 and insulin pump rates had decreased during this admission. Spoke with RN Meghan Hoppenbauer. Patient had nausea and vomiting during ultrasound so currently back in her room. Fasting CBG 71 and remains on insulin pump. Will follow while in the hospital and plan to see pt.  Thank you, Nani Gasser. Zalan Shidler, RN, MSN, CDE  Diabetes Coordinator Inpatient Glycemic Control Team Team Pager [phone] (8am-5pm) 10/16/2022 9:32 AM

## 2022-10-16 NOTE — Progress Notes (Signed)
Hypoglycemic Event  CBG: 56  Treatment: 4 oz juice/soda  Symptoms: None  Follow-up CBG: Time:2230 CBG Result:97  Possible Reasons for Event: Inadequate meal intake    Sabrina Sutton Sabrina Sutton

## 2022-10-16 NOTE — Progress Notes (Signed)
Patient ID: Sabrina Sutton, female   DOB: [birth year], 27 y.o.   MRN: [id number] [place]) NOTE  Sabrina Sutton is a 27 y.o. G1P0 at [redacted]w[redacted]d by LMP who is admitted for hyperemesis  .   Fetal presentation is cephalic. Length of Stay:  0  Days  Subjective: Still has nausea but had some sips Patient reports the fetal movement as active. Patient reports uterine contraction  activity as none. Patient reports  vaginal bleeding as none. Patient describes fluid per vagina as None.  Vitals:  Blood pressure 119/67, pulse 96, temperature 98 F (36.7 C), temperature source Oral, resp. rate 13, weight 62.1 kg, last menstrual period 02/09/2022, SpO2 98 %. Physical Examination:  General appearance - ill-appearing Heart - normal rate and regular rhythm Abdomen - soft, nontender, nondistended Fundal Height:  size equals dates Cervical Exam: Not evaluated.  Extremities: extremities normal, atraumatic, no cyanosis or edema and Homans sign is negative, no sign of DVT with DTRs 2+ bilaterally Membranes:intact  Fetal Monitoring:   Fetal Heart Rate A   Mode External filed at 10/16/2022 0805  Baseline Rate (A) 130 bpm filed at 10/16/2022 0805  Variability <5 BPM, 6-25 BPM filed at 10/16/2022 0805  Accelerations 15 x 15 filed at 10/16/2022 0805  Decelerations Variable filed at 10/16/2022 0805    Labs:  Results for orders placed or performed during the hospital encounter of 10/15/22 (from the past 24 hour(s))  CBC   Collection Time: 10/15/22 10:54 AM  Result Value Ref Range   WBC 9.9 4.0 - 10.5 K/uL   RBC 4.22 3.87 - 5.11 MIL/uL   Hemoglobin 12.0 12.0 - 15.0 g/dL   HCT 35.8 (L) 36.0 - 46.0 %   MCV 84.8 80.0 - 100.0 fL   MCH 28.4 26.0 - 34.0 pg   MCHC 33.5 30.0 - 36.0 g/dL   RDW 14.2 11.5 - 15.5 %   Platelets 300 150 - 400 K/uL   nRBC 0.0 0.0 - 0.2 %  Comprehensive metabolic panel   Collection Time: 10/15/22 10:54 AM  Result Value Ref Range   Sodium 132 (L)  135 - 145 mmol/L   Potassium 4.2 3.5 - 5.1 mmol/L   Chloride 101 98 - 111 mmol/L   CO2 20 (L) 22 - 32 mmol/L   Glucose, Bld 145 (H) 70 - 99 mg/dL   BUN 7 6 - 20 mg/dL   Creatinine, Ser 0.66 0.44 - 1.00 mg/dL   Calcium 8.6 (L) 8.9 - 10.3 mg/dL   Total Protein 6.6 6.5 - 8.1 g/dL   Albumin 2.9 (L) 3.5 - 5.0 g/dL   AST 26 15 - 41 U/L   ALT 13 0 - 44 U/L   Alkaline Phosphatase 184 (H) 38 - 126 U/L   Total Bilirubin 0.6 0.3 - 1.2 mg/dL   GFR, Estimated >60 >60 mL/min   Anion gap 11 5 - 15  Beta-hydroxybutyric acid   Collection Time: 10/15/22 10:54 AM  Result Value Ref Range   Beta-Hydroxybutyric Acid 0.73 (H) 0.05 - 0.27 mmol/L  Glucose, capillary   Collection Time: 10/15/22 11:05 AM  Result Value Ref Range   Glucose-Capillary 154 (H) 70 - 99 mg/dL  Glucose, capillary   Collection Time: 10/15/22  2:25 PM  Result Value Ref Range   Glucose-Capillary 114 (H) 70 - 99 mg/dL  Glucose, capillary   Collection Time: 10/15/22  6:40 PM  Result Value Ref Range   Glucose-Capillary 163 (H) 70 - 99 mg/dL  Glucose, capillary   Collection  Time: 10/15/22 10:19 PM  Result Value Ref Range   Glucose-Capillary 174 (H) 70 - 99 mg/dL  Glucose, capillary   Collection Time: 10/16/22  2:14 AM  Result Value Ref Range   Glucose-Capillary 130 (H) 70 - 99 mg/dL  Basic metabolic panel   Collection Time: 10/16/22  4:52 AM  Result Value Ref Range   Sodium 134 (L) 135 - 145 mmol/L   Potassium 3.1 (L) 3.5 - 5.1 mmol/L   Chloride 104 98 - 111 mmol/L   CO2 20 (L) 22 - 32 mmol/L   Glucose, Bld 75 70 - 99 mg/dL   BUN 8 6 - 20 mg/dL   Creatinine, Ser 0.76 0.44 - 1.00 mg/dL   Calcium 7.9 (L) 8.9 - 10.3 mg/dL   GFR, Estimated >60 >60 mL/min   Anion gap 10 5 - 15  Glucose, capillary   Collection Time: 10/16/22  6:22 AM  Result Value Ref Range   Glucose-Capillary 71 70 - 99 mg/dL     Medications:  Scheduled  docusate sodium  100 mg Oral Daily   insulin pump   Subcutaneous TID PC,HS,0200   metoCLOPramide  (REGLAN) injection  5 mg Intravenous Q12H   NIFEdipine  30 mg Oral Daily   ondansetron (ZOFRAN) IV  4 mg Intravenous Q8H   prenatal multivitamin  1 tablet Oral Q1200   scopolamine  1 patch Transdermal Once   I have reviewed the patient's current medications.  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

## 2022-10-17 LAB — GLUCOSE, CAPILLARY
Glucose-Capillary: 72 mg/dL (ref 70–99)
Glucose-Capillary: 50 mg/dL — ABNORMAL LOW (ref 70–99)
Glucose-Capillary: 51 mg/dL — ABNORMAL LOW (ref 70–99)
Glucose-Capillary: 67 mg/dL — ABNORMAL LOW (ref 70–99)
Glucose-Capillary: 70 mg/dL (ref 70–99)
Glucose-Capillary: 113 mg/dL — ABNORMAL HIGH (ref 70–99)
Glucose-Capillary: 102 mg/dL — ABNORMAL HIGH (ref 70–99)
Glucose-Capillary: 108 mg/dL — ABNORMAL HIGH (ref 70–99)
Glucose-Capillary: 200 mg/dL — ABNORMAL HIGH (ref 70–99)
Glucose-Capillary: 300 mg/dL — ABNORMAL HIGH (ref 70–99)

## 2022-10-17 MED ORDER — GLUCAGON HCL RDNA (DIAGNOSTIC) 1 MG IJ SOLR
1.0000 mg | Freq: Once | INTRAMUSCULAR | Status: AC
  Administered 2022-10-17: 1 mg via INTRAVENOUS

## 2022-10-17 MED ORDER — GLUCAGON HCL RDNA (DIAGNOSTIC) 1 MG IJ SOLR
INTRAMUSCULAR | Status: AC
  Filled 2022-10-17: qty 1

## 2022-10-17 MED ORDER — LACTATED RINGERS IV BOLUS
500.0000 mL | Freq: Once | INTRAVENOUS | Status: AC
  Administered 2022-10-17: 500 mL via INTRAVENOUS

## 2022-10-17 NOTE — Progress Notes (Signed)
Patient ID: Gwynneth Vandolah, female   DOB: Aug 25, 1995, 27 y.o.   MRN: [id number] [place]) NOTE  Glenda [place] is a 27 y.o. G1P0 at [redacted]w[redacted]d by LMP, early ultrasound who is admitted for nausea and vomiting and diabetes management.   Fetal presentation is cephalic. Length of Stay:  0  Days  Subjective: Less nausea and wants to advance diet Patient reports the fetal movement as active. Patient reports uterine contraction  activity as none. Patient reports  vaginal bleeding as none. Patient describes fluid per vagina as None.  Vitals:  Blood pressure (!) 137/92, pulse (!) 108, temperature 98 F (36.7 C), temperature source Oral, resp. rate 18, height 5\' 7"  (1.702 m), weight 62.9 kg, last menstrual period 02/09/2022, SpO2 100 %. Physical Examination:  General appearance - alert, well appearing, and in no distress Heart - normal rate and regular rhythm Abdomen - soft, nontender, nondistended Fundal Height:  size equals dates Extremities: extremities normal, atraumatic, no cyanosis or edema and Homans sign is negative, no sign of DVT  Membranes:intact  Fetal Monitoring:   Fetal Heart Rate A   Mode External filed at 10/16/2022 2126  Baseline Rate (A) 135 bpm filed at 10/16/2022 2126  Variability 6-25 BPM filed at 10/16/2022 2126  Accelerations 15 x 15 filed at 10/16/2022 2126  Decelerations Variable filed at 10/16/2022 2126    Labs:  Results for orders placed or performed during the hospital encounter of 10/15/22 (from the past 24 hour(s))  Glucose, capillary   Collection Time: 10/16/22 10:22 AM  Result Value Ref Range   Glucose-Capillary 102 (H) 70 - 99 mg/dL  Glucose, capillary   Collection Time: 10/16/22  2:22 PM  Result Value Ref Range   Glucose-Capillary 132 (H) 70 - 99 mg/dL  Glucose, capillary   Collection Time: 10/16/22  6:26 PM  Result Value Ref Range   Glucose-Capillary 70 70 - 99 mg/dL  Glucose, capillary   Collection Time:  10/16/22 10:12 PM  Result Value Ref Range   Glucose-Capillary 56 (L) 70 - 99 mg/dL  Glucose, capillary   Collection Time: 10/16/22 10:30 PM  Result Value Ref Range   Glucose-Capillary 97 70 - 99 mg/dL  Glucose, capillary   Collection Time: 10/17/22  1:58 AM  Result Value Ref Range   Glucose-Capillary 72 70 - 99 mg/dL  Glucose, capillary   Collection Time: 10/17/22  5:58 AM  Result Value Ref Range   Glucose-Capillary 50 (L) 70 - 99 mg/dL  Glucose, capillary   Collection Time: 10/17/22  6:16 AM  Result Value Ref Range   Glucose-Capillary 51 (L) 70 - 99 mg/dL  Glucose, capillary   Collection Time: 10/17/22  6:36 AM  Result Value Ref Range   Glucose-Capillary 70 70 - 99 mg/dL  Glucose, capillary   Collection Time: 10/17/22  6:52 AM  Result Value Ref Range   Glucose-Capillary 67 (L) 70 - 99 mg/dL  Glucose, capillary   Collection Time: 10/17/22  7:49 AM  Result Value Ref Range   Glucose-Capillary 113 (H) 70 - 99 mg/dL     Medications:  Scheduled  docusate sodium  100 mg Oral Daily   insulin pump   Subcutaneous TID PC,HS,0200   metoCLOPramide (REGLAN) injection  5 mg Intravenous Q12H   NIFEdipine  30 mg Oral Daily   ondansetron (ZOFRAN) IV  4 mg Intravenous Q8H   pantoprazole (PROTONIX) IV  40 mg Intravenous Q24H   prenatal multivitamin  1 tablet Oral Q1200   scopolamine  1 patch Transdermal  Once   I have reviewed the patient's current medications.  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  Basals: 12a-6a- 0.7 units/ hr 6a-MN- 0.7 units/hr 1 unit/13 grams CHO and 1 unit drops blood sugar 50 mg/dL Total basal = 16.8 units/24 hrs.  Emeterio Reeve 10/17/2022,10:00 AM

## 2022-10-17 NOTE — Inpatient Diabetes Management (Addendum)
Inpatient Diabetes Program Recommendations  ADA Standards of Care 2023 Diabetes in Pregnancy Target Glucose Ranges:  Fasting: 70 - 95 mg/dL 1 hr postprandial:  110 - 140mg /dL (from first bite of meal) 2 hr postprandial:  100 - 120 mg/dL (from first bit of meal)  Lab Results  Component Value Date   GLUCAP 113 (H) 10/17/2022   HGBA1C 6.4 (H) 08/14/2022    Review of Glycemic Control  Diabetes history: DM1 Outpatient Diabetes medications: Omnipod insulin pump with Dexcom CGM Current orders for Inpatient glycemic control: Insulin Pump  Inpatient Diabetes Program Recommendations:  No change in insulin pump settings since last admission.  Basals: 12a-6a- 0.7 units/ hr 6a-MN- 0.7 units/hr 1 unit/13 grams CHO and 1 unit drops blood sugar 50 mg/dL Total basal = 16.8 units/24 hrs.  Spoke with patient @ bedside with Dr. Roselie Awkward and reviewed insulin pump settings.  Patient had hypoglycemia overnight and unable to eat. Patient states she now feels hungry and feels that she can eat. Will follow and may need adjustments if hypoglycemia continues.  Thank you, Nani Gasser. Ankita Newcomer, RN, MSN, CDE  Diabetes Coordinator Inpatient Glycemic Control Team Team Pager [phone] (8am-5pm) 10/17/2022 9:10 AM

## 2022-10-18 ENCOUNTER — Encounter (HOSPITAL_COMMUNITY)
Admission: AD | Disposition: A | Discharge status: Home / Self Care | Payer: Self-pay | Source: Home / Self Care | Attending: Obstetrics & Gynecology

## 2022-10-18 ENCOUNTER — Other Ambulatory Visit: Payer: Self-pay

## 2022-10-18 ENCOUNTER — Encounter: Payer: Self-pay | Admitting: Obstetrics and Gynecology

## 2022-10-18 ENCOUNTER — Inpatient Hospital Stay (HOSPITAL_COMMUNITY): Payer: Medicaid Other | Admitting: Anesthesiology

## 2022-10-18 ENCOUNTER — Encounter (HOSPITAL_COMMUNITY): Payer: Self-pay | Admitting: Obstetrics & Gynecology

## 2022-10-18 DIAGNOSIS — Z23 Encounter for immunization: Secondary | ICD-10-CM | POA: Diagnosis not present

## 2022-10-18 DIAGNOSIS — O24424 Gestational diabetes mellitus in childbirth, insulin controlled: Secondary | ICD-10-CM

## 2022-10-18 DIAGNOSIS — O211 Hyperemesis gravidarum with metabolic disturbance: Secondary | ICD-10-CM

## 2022-10-18 DIAGNOSIS — E109 Type 1 diabetes mellitus without complications: Secondary | ICD-10-CM | POA: Diagnosis not present

## 2022-10-18 DIAGNOSIS — Z9641 Presence of insulin pump (external) (internal): Secondary | ICD-10-CM | POA: Diagnosis not present

## 2022-10-18 DIAGNOSIS — Z3A35 35 weeks gestation of pregnancy: Secondary | ICD-10-CM

## 2022-10-18 DIAGNOSIS — O210 Mild hyperemesis gravidarum: Secondary | ICD-10-CM

## 2022-10-18 DIAGNOSIS — O134 Gestational [pregnancy-induced] hypertension without significant proteinuria, complicating childbirth: Secondary | ICD-10-CM

## 2022-10-18 DIAGNOSIS — O43813 Placental infarction, third trimester: Secondary | ICD-10-CM | POA: Diagnosis not present

## 2022-10-18 DIAGNOSIS — Z98891 History of uterine scar from previous surgery: Secondary | ICD-10-CM

## 2022-10-18 DIAGNOSIS — O26893 Other specified pregnancy related conditions, third trimester: Secondary | ICD-10-CM | POA: Diagnosis not present

## 2022-10-18 DIAGNOSIS — F419 Anxiety disorder, unspecified: Secondary | ICD-10-CM | POA: Diagnosis not present

## 2022-10-18 DIAGNOSIS — Z794 Long term (current) use of insulin: Secondary | ICD-10-CM | POA: Diagnosis not present

## 2022-10-18 DIAGNOSIS — O2492 Unspecified diabetes mellitus in childbirth: Secondary | ICD-10-CM

## 2022-10-18 DIAGNOSIS — A6000 Herpesviral infection of urogenital system, unspecified: Secondary | ICD-10-CM | POA: Diagnosis not present

## 2022-10-18 DIAGNOSIS — O2402 Pre-existing diabetes mellitus, type 1, in childbirth: Secondary | ICD-10-CM | POA: Diagnosis not present

## 2022-10-18 DIAGNOSIS — O9832 Other infections with a predominantly sexual mode of transmission complicating childbirth: Secondary | ICD-10-CM | POA: Diagnosis not present

## 2022-10-18 DIAGNOSIS — R030 Elevated blood-pressure reading, without diagnosis of hypertension: Secondary | ICD-10-CM | POA: Diagnosis not present

## 2022-10-18 DIAGNOSIS — Z87891 Personal history of nicotine dependence: Secondary | ICD-10-CM | POA: Diagnosis not present

## 2022-10-18 DIAGNOSIS — Z30430 Encounter for insertion of intrauterine contraceptive device: Secondary | ICD-10-CM

## 2022-10-18 DIAGNOSIS — O99344 Other mental disorders complicating childbirth: Secondary | ICD-10-CM | POA: Diagnosis not present

## 2022-10-18 LAB — GLUCOSE, CAPILLARY
Glucose-Capillary: 156 mg/dL — ABNORMAL HIGH (ref 70–99)
Glucose-Capillary: 161 mg/dL — ABNORMAL HIGH (ref 70–99)
Glucose-Capillary: 178 mg/dL — ABNORMAL HIGH (ref 70–99)
Glucose-Capillary: 196 mg/dL — ABNORMAL HIGH (ref 70–99)
Glucose-Capillary: 213 mg/dL — ABNORMAL HIGH (ref 70–99)
Glucose-Capillary: 215 mg/dL — ABNORMAL HIGH (ref 70–99)
Glucose-Capillary: 248 mg/dL — ABNORMAL HIGH (ref 70–99)
Glucose-Capillary: 256 mg/dL — ABNORMAL HIGH (ref 70–99)
Glucose-Capillary: 278 mg/dL — ABNORMAL HIGH (ref 70–99)
Glucose-Capillary: 282 mg/dL — ABNORMAL HIGH (ref 70–99)
Glucose-Capillary: 282 mg/dL — ABNORMAL HIGH (ref 70–99)
Glucose-Capillary: 310 mg/dL — ABNORMAL HIGH (ref 70–99)
Glucose-Capillary: 325 mg/dL — ABNORMAL HIGH (ref 70–99)
Glucose-Capillary: 326 mg/dL — ABNORMAL HIGH (ref 70–99)
Glucose-Capillary: 333 mg/dL — ABNORMAL HIGH (ref 70–99)
Glucose-Capillary: 378 mg/dL — ABNORMAL HIGH (ref 70–99)

## 2022-10-18 LAB — BASIC METABOLIC PANEL
Anion gap: 17 — ABNORMAL HIGH (ref 5–15)
Anion gap: 21 — ABNORMAL HIGH (ref 5–15)
BUN: 7 mg/dL (ref 6–20)
BUN: 7 mg/dL (ref 6–20)
CO2: 17 mmol/L — ABNORMAL LOW (ref 22–32)
CO2: 14 mmol/L — ABNORMAL LOW (ref 22–32)
Calcium: 8.8 mg/dL — ABNORMAL LOW (ref 8.9–10.3)
Calcium: 8.9 mg/dL (ref 8.9–10.3)
Chloride: 96 mmol/L — ABNORMAL LOW (ref 98–111)
Chloride: 94 mmol/L — ABNORMAL LOW (ref 98–111)
Creatinine, Ser: 0.92 mg/dL (ref 0.44–1.00)
Creatinine, Ser: 1.07 mg/dL — ABNORMAL HIGH (ref 0.44–1.00)
GFR, Estimated: >60 mL/min (ref >60)
GFR, Estimated: >60 mL/min (ref >60)
Glucose, Bld: 256 mg/dL — ABNORMAL HIGH (ref 70–99)
Glucose, Bld: 347 mg/dL — ABNORMAL HIGH (ref 70–99)
Potassium: 4.1 mmol/L (ref 3.5–5.1)
Potassium: 3.7 mmol/L (ref 3.5–5.1)
Sodium: 130 mmol/L — ABNORMAL LOW (ref 135–145)
Sodium: 129 mmol/L — ABNORMAL LOW (ref 135–145)

## 2022-10-18 LAB — TYPE AND SCREEN
ABO/RH(D): O POS
Antibody Screen: NEGATIVE

## 2022-10-18 SURGERY — Surgical Case
Anesthesia: Spinal

## 2022-10-18 MED ORDER — FENTANYL CITRATE (PF) 100 MCG/2ML IJ SOLN
INTRAMUSCULAR | Status: AC
  Filled 2022-10-18: qty 2

## 2022-10-18 MED ORDER — TETANUS-DIPHTH-ACELL PERTUSSIS 5-2.5-18.5 LF-MCG/0.5 IM SUSY
0.5000 mL | PREFILLED_SYRINGE | Freq: Once | INTRAMUSCULAR | Status: AC
  Administered 2022-10-21: 0.5 mL via INTRAMUSCULAR
  Filled 2022-10-18: qty 0.5

## 2022-10-18 MED ORDER — SCOPOLAMINE 1 MG/3DAYS TD PT72
1.0000 | MEDICATED_PATCH | TRANSDERMAL | Status: DC
  Administered 2022-10-18: 1.5 mg via TRANSDERMAL
  Filled 2022-10-18: qty 1

## 2022-10-18 MED ORDER — DEXTROSE IN LACTATED RINGERS 5 % IV SOLN: INTRAVENOUS | Status: DC

## 2022-10-18 MED ORDER — DEXTROSE 50 % IV SOLN: 0.0000 mL | INTRAVENOUS | Status: DC | PRN

## 2022-10-18 MED ORDER — LACTATED RINGERS IV SOLN: INTRAVENOUS | Status: DC

## 2022-10-18 MED ORDER — MORPHINE SULFATE (PF) 0.5 MG/ML IJ SOLN
INTRAMUSCULAR | Status: DC | PRN
  Administered 2022-10-18: 150 ug via INTRATHECAL

## 2022-10-18 MED ORDER — SERTRALINE HCL 50 MG PO TABS
50.0000 mg | ORAL_TABLET | Freq: Every day | ORAL | Status: DC
  Administered 2022-10-18 – 2022-10-22 (×5): 50 mg via ORAL
  Filled 2022-10-18 (×5): qty 1

## 2022-10-18 MED ORDER — PRENATAL MULTIVITAMIN CH
1.0000 | ORAL_TABLET | Freq: Every day | ORAL | Status: DC
  Administered 2022-10-19 – 2022-10-21 (×3): 1 via ORAL
  Filled 2022-10-18 (×3): qty 1

## 2022-10-18 MED ORDER — SERTRALINE HCL 50 MG PO TABS
50.0000 mg | ORAL_TABLET | Freq: Every day | ORAL | Status: DC
  Administered 2022-10-18: 50 mg via ORAL
  Filled 2022-10-18: qty 1

## 2022-10-18 MED ORDER — OXYTOCIN-SODIUM CHLORIDE 30-0.9 UT/500ML-% IV SOLN
INTRAVENOUS | Status: AC
  Filled 2022-10-18: qty 500

## 2022-10-18 MED ORDER — KETOROLAC TROMETHAMINE 30 MG/ML IJ SOLN
30.0000 mg | Freq: Four times a day (QID) | INTRAMUSCULAR | Status: AC
  Administered 2022-10-18 – 2022-10-19 (×4): 30 mg via INTRAVENOUS
  Filled 2022-10-18 (×4): qty 1

## 2022-10-18 MED ORDER — INSULIN ASPART 100 UNIT/ML IJ SOLN: 0.0000 [IU] | INTRAMUSCULAR | Status: DC

## 2022-10-18 MED ORDER — SIMETHICONE 80 MG PO CHEW
80.0000 mg | CHEWABLE_TABLET | Freq: Three times a day (TID) | ORAL | Status: DC
  Administered 2022-10-18 – 2022-10-22 (×10): 80 mg via ORAL
  Filled 2022-10-18 (×10): qty 1

## 2022-10-18 MED ORDER — ACETAMINOPHEN 500 MG PO TABS
1000.0000 mg | ORAL_TABLET | Freq: Four times a day (QID) | ORAL | Status: DC
  Administered 2022-10-19 – 2022-10-22 (×13): 1000 mg via ORAL
  Filled 2022-10-18 (×15): qty 2

## 2022-10-18 MED ORDER — SODIUM CHLORIDE 0.9 % IV SOLN
INTRAVENOUS | Status: DC | PRN
  Administered 2022-10-18: 500 mg via INTRAVENOUS

## 2022-10-18 MED ORDER — SODIUM CHLORIDE 0.9% FLUSH
3.0000 mL | Freq: Two times a day (BID) | INTRAVENOUS | Status: DC
  Administered 2022-10-18: 3 mL via INTRAVENOUS

## 2022-10-18 MED ORDER — DIPHENHYDRAMINE HCL 25 MG PO CAPS: 25.0000 mg | ORAL_CAPSULE | Freq: Four times a day (QID) | ORAL | Status: DC | PRN

## 2022-10-18 MED ORDER — ONDANSETRON HCL 4 MG/2ML IJ SOLN
4.0000 mg | Freq: Four times a day (QID) | INTRAMUSCULAR | Status: DC
  Administered 2022-10-18 – 2022-10-19 (×4): 4 mg via INTRAVENOUS
  Filled 2022-10-18 (×4): qty 2

## 2022-10-18 MED ORDER — ZOLPIDEM TARTRATE 5 MG PO TABS: 5.0000 mg | ORAL_TABLET | Freq: Every evening | ORAL | Status: DC | PRN

## 2022-10-18 MED ORDER — DEXAMETHASONE SODIUM PHOSPHATE 10 MG/ML IJ SOLN
INTRAMUSCULAR | Status: DC | PRN
  Administered 2022-10-18: 4 mg via INTRAVENOUS

## 2022-10-18 MED ORDER — POTASSIUM CHLORIDE 10 MEQ/100ML IV SOLN
10.0000 meq | INTRAVENOUS | Status: AC
  Filled 2022-10-18 (×2): qty 100

## 2022-10-18 MED ORDER — DIBUCAINE (PERIANAL) 1 % EX OINT: 1.0000 | TOPICAL_OINTMENT | CUTANEOUS | Status: DC | PRN

## 2022-10-18 MED ORDER — FUROSEMIDE 20 MG PO TABS
20.0000 mg | ORAL_TABLET | Freq: Every day | ORAL | Status: AC
  Administered 2022-10-18 – 2022-10-22 (×5): 20 mg via ORAL
  Filled 2022-10-18 (×5): qty 1

## 2022-10-18 MED ORDER — INSULIN REGULAR(HUMAN) IN NACL 100-0.9 UT/100ML-% IV SOLN: INTRAVENOUS | Status: DC

## 2022-10-18 MED ORDER — ACETAMINOPHEN 10 MG/ML IV SOLN
INTRAVENOUS | Status: DC | PRN
  Administered 2022-10-18: 1000 mg via INTRAVENOUS

## 2022-10-18 MED ORDER — MENTHOL 3 MG MT LOZG: 1.0000 | LOZENGE | OROMUCOSAL | Status: DC | PRN

## 2022-10-18 MED ORDER — SENNOSIDES-DOCUSATE SODIUM 8.6-50 MG PO TABS
2.0000 | ORAL_TABLET | Freq: Every day | ORAL | Status: DC
  Administered 2022-10-19 – 2022-10-22 (×3): 2 via ORAL
  Filled 2022-10-18 (×4): qty 2

## 2022-10-18 MED ORDER — OXYTOCIN-SODIUM CHLORIDE 30-0.9 UT/500ML-% IV SOLN: 2.5000 [IU]/h | INTRAVENOUS | Status: AC

## 2022-10-18 MED ORDER — PHENYLEPHRINE HCL-NACL 20-0.9 MG/250ML-% IV SOLN
INTRAVENOUS | Status: DC | PRN
  Administered 2022-10-18: 60 ug/min via INTRAVENOUS

## 2022-10-18 MED ORDER — SIMETHICONE 80 MG PO CHEW: 80.0000 mg | CHEWABLE_TABLET | ORAL | Status: DC | PRN

## 2022-10-18 MED ORDER — FENTANYL CITRATE (PF) 100 MCG/2ML IJ SOLN
INTRAMUSCULAR | Status: DC | PRN
  Administered 2022-10-18: 15 ug via INTRATHECAL

## 2022-10-18 MED ORDER — CEFAZOLIN SODIUM-DEXTROSE 2-3 GM-%(50ML) IV SOLR
INTRAVENOUS | Status: DC | PRN
  Administered 2022-10-18: 2 g via INTRAVENOUS

## 2022-10-18 MED ORDER — TERBUTALINE SULFATE 1 MG/ML IJ SOLN
INTRAMUSCULAR | Status: AC
  Administered 2022-10-18: 0.25 mg via SUBCUTANEOUS
  Filled 2022-10-18: qty 1

## 2022-10-18 MED ORDER — SODIUM CHLORIDE 0.9% FLUSH: 3.0000 mL | INTRAVENOUS | Status: DC | PRN

## 2022-10-18 MED ORDER — GABAPENTIN 100 MG PO CAPS
200.0000 mg | ORAL_CAPSULE | Freq: Every day | ORAL | Status: DC
  Administered 2022-10-18 – 2022-10-21 (×4): 200 mg via ORAL
  Filled 2022-10-18 (×4): qty 2

## 2022-10-18 MED ORDER — OXYTOCIN-SODIUM CHLORIDE 30-0.9 UT/500ML-% IV SOLN
INTRAVENOUS | Status: DC | PRN
  Administered 2022-10-18: 400 mL via INTRAVENOUS

## 2022-10-18 MED ORDER — TERBUTALINE SULFATE 1 MG/ML IJ SOLN: 0.2500 mg | Freq: Once | INTRAMUSCULAR | Status: AC

## 2022-10-18 MED ORDER — SOD CITRATE-CITRIC ACID 500-334 MG/5ML PO SOLN
ORAL | Status: AC
  Administered 2022-10-18: 30 mL
  Filled 2022-10-18: qty 30

## 2022-10-18 MED ORDER — HYDROMORPHONE HCL 1 MG/ML IJ SOLN
INTRAMUSCULAR | Status: AC
  Filled 2022-10-18: qty 1

## 2022-10-18 MED ORDER — TRANEXAMIC ACID-NACL 1000-0.7 MG/100ML-% IV SOLN
INTRAVENOUS | Status: DC | PRN
  Administered 2022-10-18: 1000 mg via INTRAVENOUS

## 2022-10-18 MED ORDER — ACETAMINOPHEN 10 MG/ML IV SOLN
INTRAVENOUS | Status: AC
  Filled 2022-10-18: qty 100

## 2022-10-18 MED ORDER — IBUPROFEN 600 MG PO TABS
600.0000 mg | ORAL_TABLET | Freq: Four times a day (QID) | ORAL | Status: DC
  Administered 2022-10-19 – 2022-10-22 (×11): 600 mg via ORAL
  Filled 2022-10-18 (×11): qty 1

## 2022-10-18 MED ORDER — HYDROMORPHONE HCL 1 MG/ML IJ SOLN
1.0000 mg | INTRAMUSCULAR | Status: DC | PRN
  Administered 2022-10-18: 1 mg via INTRAVENOUS

## 2022-10-18 MED ORDER — WITCH HAZEL-GLYCERIN EX PADS: 1.0000 | MEDICATED_PAD | CUTANEOUS | Status: DC | PRN

## 2022-10-18 MED ORDER — COCONUT OIL OIL
1.0000 | TOPICAL_OIL | Status: DC | PRN
  Administered 2022-10-19: 1 via TOPICAL

## 2022-10-18 MED ORDER — LEVONORGESTREL 20 MCG/DAY IU IUD
INTRAUTERINE_SYSTEM | INTRAUTERINE | Status: AC
  Filled 2022-10-18: qty 1

## 2022-10-18 MED ORDER — ONDANSETRON HCL 4 MG/2ML IJ SOLN
INTRAMUSCULAR | Status: AC
  Filled 2022-10-18: qty 2

## 2022-10-18 MED ORDER — ENOXAPARIN SODIUM 40 MG/0.4ML IJ SOSY
40.0000 mg | PREFILLED_SYRINGE | INTRAMUSCULAR | Status: DC
  Administered 2022-10-19 – 2022-10-22 (×4): 40 mg via SUBCUTANEOUS
  Filled 2022-10-18 (×4): qty 0.4

## 2022-10-18 MED ORDER — LEVONORGESTREL 20 MCG/DAY IU IUD
1.0000 | INTRAUTERINE_SYSTEM | Freq: Once | INTRAUTERINE | Status: AC
  Administered 2022-10-18: 1 via INTRAUTERINE

## 2022-10-18 MED ORDER — INSULIN REGULAR(HUMAN) IN NACL 100-0.9 UT/100ML-% IV SOLN
INTRAVENOUS | Status: DC
  Administered 2022-10-18 (×3): 9 [IU]/h via INTRAVENOUS

## 2022-10-18 MED ORDER — BUPIVACAINE IN DEXTROSE 0.75-8.25 % IT SOLN
INTRATHECAL | Status: DC | PRN
  Administered 2022-10-18: 1.6 mL via INTRATHECAL

## 2022-10-18 MED ORDER — PHENYLEPHRINE 80 MCG/ML (10ML) SYRINGE FOR IV PUSH (FOR BLOOD PRESSURE SUPPORT)
PREFILLED_SYRINGE | INTRAVENOUS | Status: DC | PRN
  Administered 2022-10-18: 120 ug via INTRAVENOUS
  Administered 2022-10-18: 80 ug via INTRAVENOUS
  Administered 2022-10-18: 160 ug via INTRAVENOUS
  Administered 2022-10-18: 120 ug via INTRAVENOUS
  Administered 2022-10-18: 160 ug via INTRAVENOUS

## 2022-10-18 MED ORDER — INSULIN PUMP
SUBCUTANEOUS | Status: DC
  Filled 2022-10-18: qty 1

## 2022-10-18 MED ORDER — MORPHINE SULFATE (PF) 0.5 MG/ML IJ SOLN
INTRAMUSCULAR | Status: AC
  Filled 2022-10-18: qty 10

## 2022-10-18 MED ORDER — DEXTROSE 50 % IV SOLN
0.0000 mL | INTRAVENOUS | Status: DC | PRN
  Administered 2022-10-19: 25 mL via INTRAVENOUS
  Filled 2022-10-18: qty 50

## 2022-10-18 MED ORDER — TRANEXAMIC ACID-NACL 1000-0.7 MG/100ML-% IV SOLN
INTRAVENOUS | Status: AC
  Filled 2022-10-18: qty 200

## 2022-10-18 SURGICAL SUPPLY — 32 items
CHLORAPREP W/TINT 26 (MISCELLANEOUS) ×2 IMPLANT
CLAMP UMBILICAL CORD (MISCELLANEOUS) ×1 IMPLANT
CLOTH BEACON ORANGE TIMEOUT ST (SAFETY) ×1 IMPLANT
DRSG OPSITE POSTOP 4X10 (GAUZE/BANDAGES/DRESSINGS) ×1 IMPLANT
DRSG OPSITE POSTOP 4X8 (GAUZE/BANDAGES/DRESSINGS) IMPLANT
ELECT REM PT RETURN 9FT ADLT (ELECTROSURGICAL) ×1
ELECTRODE REM PT RTRN 9FT ADLT (ELECTROSURGICAL) ×1 IMPLANT
EXTRACTOR VACUUM BELL STYLE (SUCTIONS) IMPLANT
GLOVE BIOGEL PI IND STRL 7.0 (GLOVE) ×2 IMPLANT
GLOVE ECLIPSE 7.0 STRL STRAW (GLOVE) ×1 IMPLANT
GOWN STRL REUS W/TWL LRG LVL3 (GOWN DISPOSABLE) ×2 IMPLANT
KIT ABG SYR 3ML LUER SLIP (SYRINGE) ×1 IMPLANT
NDL HYPO 25X5/8 SAFETYGLIDE (NEEDLE) ×1 IMPLANT
NEEDLE HYPO 25X5/8 SAFETYGLIDE (NEEDLE) ×1 IMPLANT
NS IRRIG 1000ML POUR BTL (IV SOLUTION) ×1 IMPLANT
PACK C SECTION WH (CUSTOM PROCEDURE TRAY) ×1 IMPLANT
PAD OB MATERNITY 4.3X12.25 (PERSONAL CARE ITEMS) ×1 IMPLANT
RTRCTR C-SECT PINK 25CM LRG (MISCELLANEOUS) ×1 IMPLANT
SUT MNCRL 0 VIOLET CTX 36 (SUTURE) ×2 IMPLANT
SUT MONOCRYL 0 CTX 36 (SUTURE) ×2
SUT PLAIN 0 NONE (SUTURE) IMPLANT
SUT PLAIN 2 0 (SUTURE)
SUT PLAIN 2 0 XLH (SUTURE) IMPLANT
SUT PLAIN ABS 2-0 CT1 27XMFL (SUTURE) IMPLANT
SUT VIC AB 0 CTX 36 (SUTURE) ×1
SUT VIC AB 0 CTX36XBRD ANBCTRL (SUTURE) ×1 IMPLANT
SUT VIC AB 2-0 CT1 27 (SUTURE)
SUT VIC AB 2-0 CT1 TAPERPNT 27 (SUTURE) IMPLANT
SUT VIC AB 4-0 KS 27 (SUTURE) ×1 IMPLANT
TOWEL OR 17X24 6PK STRL BLUE (TOWEL DISPOSABLE) ×1 IMPLANT
TRAY FOLEY W/BAG SLVR 14FR LF (SET/KITS/TRAYS/PACK) IMPLANT
WATER STERILE IRR 1000ML POUR (IV SOLUTION) ×1 IMPLANT

## 2022-10-18 NOTE — Discharge Summary (Signed)
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 yo G1P0101 at [redacted]w[redacted]d was admitted to the antepartum unit with intractable nausea and vomiting and preterm contractions on 10/15/2022. She was noted to have gestational hypertension and has a background history of Type 1 diabetes, managed with a dexcome and omnipod. On the 3rd day following admission, she was found to be having recurrent FHR decelerations, with continued uterine contractions and cervix at 2cm dilated. She was transferred to labor and delivery for management of preterm labor and given Fetal heart tracing. However shortly after admission to labor and delivery, fetal heart tracing started to show recurrent deep late decelerations. At this point an urgent C-section was called, with was converted into a STAT Cesarean section while she was being prepped in the OR due to FHR in the 50s. Delivery details as follows: Membrane Rupture Time/Date: 2:15 PM ,10/18/2022   Delivery Method:C-Section, Low Transverse  Details of operation can be found in separate operative note. Patient's postpartum  course was unremarkable. She was seen by DM educator and weaned off the endotool and started on insulin. Her CBG's responded well. Her DKA resolved. She progressed to ambulating, voiding, tolerating diet + flatus and good oral pain control. Felt amenable for discharge home.Discharge instructions , medications and follow up reviewed with pt. She verbalized understanding.   Newborn Data: Birth date:10/18/2022  Birth time:2:15 PM  Gender:Female  Living status:Living  Apgars:2 ,7  Weight:2280 g   Magnesium Sulfate received: No BMZ received: No Rhophylac:N/A MMR:N/A, Rubella Immune T-DaP: NA Flu: No Transfusion:No  Physical exam  Vitals:   10/21/22 1524 10/21/22 1928 10/22/22 0420 10/22/22 0816  BP: 119/83 114/77 107/77 117/76  Pulse: 90 74 72 81  Resp: 18 18 16 16   Temp: 97.9 F (36.6 C) 98.3 F (36.8 C) 98 F (36.7 C) 97.8 F (36.6 C)  TempSrc: Oral Oral Oral Oral  SpO2: 100% 100% 100% 99%  Weight:      Height:       General: alert Lochia: appropriate Uterine Fundus: firm Incision: Healing well with no significant drainage DVT Evaluation: No evidence of DVT seen on physical exam. Labs: Lab Results  Component Value Date   WBC 8.9 10/21/2022   HGB 10.6 (L) 10/21/2022   HCT 29.2 (L) 10/21/2022   MCV 82.5 10/21/2022   PLT 295 10/21/2022      Latest Ref Rng & Units 10/22/2022   12:47 AM  CMP  Glucose 70 - 99  mg/dL 130   BUN 6 - 20 mg/dL 10   Creatinine 0.44 - 1.00 mg/dL 0.87   Sodium 135 - 145 mmol/L 133   Potassium 3.5 - 5.1 mmol/L 3.3   Chloride 98 - 111 mmol/L 101   CO2 22 - 32 mmol/L 25   Calcium 8.9 - 10.3 mg/dL 8.7   Total Protein 6.5 - 8.1 g/dL 5.2   Total Bilirubin 0.3 - 1.2 mg/dL 0.3   Alkaline Phos 38 - 126 U/L 120   AST 15 - 41 U/L 40   ALT 0 - 44 U/L 21    Edinburgh Score:    10/19/2022    2:10 AM  Edinburgh Postnatal Depression Scale Screening Tool  I have been able to laugh and see the funny side of things. 1  I have looked forward with  enjoyment to things. 1  I have blamed myself unnecessarily when things went wrong. 2  I have been anxious or worried for no good reason. 2  I have felt scared or panicky for no good reason. 2  Things have been getting on top of me. 2  I have been so unhappy that I have had difficulty sleeping. 2  I have felt sad or miserable. 2  I have been so unhappy that I have been crying. 1  The thought of harming myself has occurred to me. 0  Edinburgh Postnatal Depression Scale Total 15     After visit meds:  Allergies as of 10/22/2022   No Known Allergies      Medication List     STOP taking these medications    Accu-Chek Guide test strip Generic drug: glucose blood   Alcohol Pads 70 % Pads   Blood Pressure Kit Devi   cyclobenzaprine 10 MG tablet Commonly known as: FLEXERIL   Dexcom G6 Receiver Devi   Dexcom G6 Sensor Misc   Dexcom G6 Transmitter Misc   feeding supplement Liqd   Gojji Weight Scale Misc   hydrOXYzine 50 MG tablet Commonly known as: ATARAX   insulin pump Soln   metoCLOPramide 10 MG tablet Commonly known as: REGLAN   multivitamin-prenatal 27-0.8 MG Tabs tablet   Omnipod 5 G6 Pods (Gen 5) Misc   ondansetron 4 MG disintegrating tablet Commonly known as: ZOFRAN-ODT   OneTouch Verio Flex System w/Device Kit   pantoprazole 40 MG tablet Commonly known as: PROTONIX   potassium chloride SA 20 MEQ tablet Commonly known as: KLOR-CON M   Prenatal Vitamin 27-0.8 MG Tabs   promethazine 25 MG tablet Commonly known as: PHENERGAN   scopolamine 1 MG/3DAYS Commonly known as: TRANSDERM-SCOP       TAKE these medications    ibuprofen 600 MG tablet Commonly known as: ADVIL Take 1 tablet (600 mg total) by mouth every 6 (six) hours.   insulin aspart 100 UNIT/ML injection Commonly known as: novoLOG Inject 3 Units into the skin 3 (three) times daily with meals.   insulin glargine-yfgn 100 UNIT/ML injection Commonly known as: SEMGLEE Inject 0.12 mLs  (12 Units total) into the skin daily. Start taking on: October 23, 2022   NIFEdipine 30 MG 24 hr tablet Commonly known as: ADALAT CC Take 1 tablet (30 mg total) by mouth daily. Start taking on: October 23, 2022   oxyCODONE-acetaminophen 5-325 MG tablet Commonly known as: PERCOCET/ROXICET Take 1 tablet by mouth every 4 (four) hours as needed.   sertraline 50 MG tablet Commonly known as: Zoloft Take 1 tablet (50 mg total) by mouth daily.  Discharge home in stable condition Infant Feeding: Breast Infant Disposition:NICU Discharge instruction: per After Visit Summary and Postpartum booklet. Activity: Advance as tolerated. Pelvic rest for 6 weeks.  Diet: carb modified diet Future Appointments: Future Appointments  Date Time Provider [place]  10/25/2022 11:00 AM [facility] None  11/15/2022 10:55 AM Cresenzo, Angelyn Punt, MD [facility] None   Follow up Visit:  [place] Follow up.   Why: Pt already has follow up appts Contact information: [address] [place] [phone]                The following message was sent to [employer] by Mikki Santee, MD  Please schedule this patient for a In person postpartum visit in 4 weeks with the following provider: MD. Additional Postpartum F/U:Incision check 1 week and BP check 1 week  High risk pregnancy complicated by:  0000000, gestational hypertension, preterm labor, hyperemesis gravidarum Delivery mode:  C-Section, Low Transverse  Anticipated Birth Control:  PP IUD placed   10/22/2022 Chancy Milroy, MD

## 2022-10-18 NOTE — Inpatient Diabetes Management (Addendum)
Inpatient Diabetes Program Recommendations  AACE/ADA: New Consensus Statement on Inpatient Glycemic Control (2015)  Target Ranges:  Prepandial:   less than 140 mg/dL      Peak postprandial:   less than 180 mg/dL (1-2 hours)      Critically ill patients:  140 - 180 mg/dL   Lab Results  Component Value Date   GLUCAP 278 (H) 10/18/2022   HGBA1C 6.4 (H) 08/14/2022    Review of Glycemic Control  Latest Reference Range & Units 10/17/22 21:04 10/17/22 23:11 10/18/22 00:15 10/18/22 02:09 10/18/22 05:42 10/18/22 10:05  Glucose-Capillary 70 - 99 mg/dL 200 (H) 300 (H) 215 (H) 156 (H) 178 (H) 278 (H)  (H): Data is abnormally high  Diabetes history: DM1 Outpatient Diabetes medications: Omnipod insulin pump with Dexcom CGM Prior to pregnancy: Lantus 25 units QD, Novolog 8 units TID  Current orders for Inpatient glycemic control: Insulin Pump Basals: 12a-6a- 0.7 units/ hr 6a-MN- 0.7 units/hr 1 unit/13 grams CHO and 1 unit drops blood sugar 50 mg/dL Total basal = 16.8 units/24 hrs.  Inpatient Diabetes Program Recommendations:    Spoke with Pam, RN and per nursing report patient has been uncomfortable contracting and concern for the discontinuation of basal insulin in Omnipod.   Discussed with Dr Roselie Awkward glucose trends this AM, and recommendations. Orders received.   Consider adding: -BMET/beta hydroxybutyric acid stat -IV insulin per Endotool -Discontinue insulin pump once IV insulin initiated.   Spoke with patient regarding hyperglycemia this AM. Patient reports covering meal for dinner last night and continuing to correct throughout the night.  Per Omnipod, patient has continued to administer 0.7 units/hr and information relayed from patient consistent with Omnipod.   In the event patient to deliver:  -Pending BMET results for concern of acidosis, consider: -Lantus 12 units QD -Novolog 0-9 units Q4H  Addendum@1500 : Noted labs, plan for CS. Consider IV insulin for hyperglycemic  crises. Discussed with Dr Roselie Awkward; orders provided.  @1530 : Spoke with patient and mother at bedside. Patient in agreement with current plan. RN at bedside, aware of plan and initiating IV insulin.   Thanks, Bronson Curb, MSN, RNC-OB Diabetes Coordinator [phone] (8a-5p)

## 2022-10-18 NOTE — Plan of Care (Signed)
Problem: Education: Goal: Knowledge of disease or condition will improve Outcome: Progressing Goal: Knowledge of the prescribed therapeutic regimen will improve Outcome: Progressing Goal: Individualized Educational Video(s) Outcome: Progressing   Problem: Clinical Measurements: Goal: Complications related to the disease process, condition or treatment will be avoided or minimized Outcome: Progressing   Problem: Education: Goal: Knowledge of disease or condition will improve Outcome: Progressing Goal: Knowledge of the prescribed therapeutic regimen will improve Outcome: Progressing   Problem: Bowel/Gastric: Goal: Occurences of nausea and/or vomiting will decrease Outcome: Progressing   Problem: Fluid Volume: Goal: Maintenance of adequate hydration will improve Outcome: Progressing   Problem: Nutritional: Goal: Achievement of adequate weight for body size and type will improve Outcome: Progressing   Problem: Education: Goal: Ability to describe self-care measures that may prevent or decrease complications (Diabetes Survival Skills Education) will improve Outcome: Progressing Goal: Individualized Educational Video(s) Outcome: Progressing   Problem: Coping: Goal: Ability to adjust to condition or change in health will improve Outcome: Progressing   Problem: Fluid Volume: Goal: Ability to maintain a balanced intake and output will improve Outcome: Progressing   Problem: Health Behavior/Discharge Planning: Goal: Ability to identify and utilize available resources and services will improve Outcome: Progressing Goal: Ability to manage health-related needs will improve Outcome: Progressing   Problem: Metabolic: Goal: Ability to maintain appropriate glucose levels will improve Outcome: Progressing   Problem: Nutritional: Goal: Maintenance of adequate nutrition will improve Outcome: Progressing Goal: Progress toward achieving an optimal weight will improve Outcome:  Progressing   Problem: Skin Integrity: Goal: Risk for impaired skin integrity will decrease Outcome: Progressing   Problem: Tissue Perfusion: Goal: Adequacy of tissue perfusion will improve Outcome: Progressing   Problem: Education: Goal: Knowledge of General Education information will improve Description: Including pain rating scale, medication(s)/side effects and non-pharmacologic comfort measures Outcome: Progressing   Problem: Health Behavior/Discharge Planning: Goal: Ability to manage health-related needs will improve Outcome: Progressing   Problem: Clinical Measurements: Goal: Ability to maintain clinical measurements within normal limits will improve Outcome: Progressing Goal: Will remain free from infection Outcome: Progressing Goal: Diagnostic test results will improve Outcome: Progressing Goal: Respiratory complications will improve Outcome: Progressing Goal: Cardiovascular complication will be avoided Outcome: Progressing   Problem: Activity: Goal: Risk for activity intolerance will decrease Outcome: Progressing   Problem: Nutrition: Goal: Adequate nutrition will be maintained Outcome: Progressing   Problem: Coping: Goal: Level of anxiety will decrease Outcome: Progressing   Problem: Elimination: Goal: Will not experience complications related to bowel motility Outcome: Progressing Goal: Will not experience complications related to urinary retention Outcome: Progressing   Problem: Pain Managment: Goal: General experience of comfort will improve Outcome: Progressing   Problem: Safety: Goal: Ability to remain free from injury will improve Outcome: Progressing   Problem: Skin Integrity: Goal: Risk for impaired skin integrity will decrease Outcome: Progressing   Problem: Education: Goal: Knowledge of the prescribed therapeutic regimen will improve Outcome: Progressing Goal: Understanding of sexual limitations or changes related to disease process  or condition will improve Outcome: Progressing Goal: Individualized Educational Video(s) Outcome: Progressing   Problem: Self-Concept: Goal: Communication of feelings regarding changes in body function or appearance will improve Outcome: Progressing   Problem: Skin Integrity: Goal: Demonstration of wound healing without infection will improve Outcome: Progressing   Problem: Education: Goal: Knowledge of condition will improve Outcome: Progressing Goal: Individualized Educational Video(s) Outcome: Progressing Goal: Individualized Newborn Educational Video(s) Outcome: Progressing   Problem: Activity: Goal: Will verbalize the importance of balancing activity with adequate rest periods Outcome:  Progressing Goal: Ability to tolerate increased activity will improve Outcome: Progressing   Problem: Coping: Goal: Ability to identify and utilize available resources and services will improve Outcome: Progressing

## 2022-10-18 NOTE — Progress Notes (Signed)
Patient ID: Sabrina Sutton, female   DOB: [birth year], 27 y.o.   MRN: [id number] [place]) NOTE  Sabrina Sutton is a 27 y.o. G1P0 at [redacted]w[redacted]d by LMP who is admitted for nausea and vomiting and diabetes management.     Fetal presentation is cephalic. Length of Stay:  0  Days  Subjective: Painful contractions Patient reports the fetal movement as active. Patient reports uterine contraction  activity as none. Patient reports  vaginal bleeding as none. Patient describes fluid per vagina as None.  Vitals:  Blood pressure 109/74, pulse 88, temperature 97.7 F (36.5 C), temperature source Oral, resp. rate 16, height 5\' 7"  (1.702 m), weight 64.4 kg, last menstrual period 02/09/2022, SpO2 99 %. Physical Examination:  General appearance - in mild to moderate distress Heart - normal rate and regular rhythm Abdomen - soft, nontender, nondistended Fundal Height:  size equals dates Cervical Exam:  Cervical Exam   Dilation 2 filed at 10/18/2022 0919  Effacement (%) 50 filed at 10/18/2022 0919  Cervical Position Anterior filed at 10/15/2022 1031  Vag. Bleeding None filed at 10/18/2022 [id number]  Station -3 filed at 10/18/2022 [id number]  Exam by: Dr. Roselie Awkward filed at 10/18/2022 0919   Extremities: extremities normal, atraumatic, no cyanosis or edema and Homans sign is negative, no sign of DVT with DTRs 2+ bilaterally Membranes:intact  Fetal Monitoring:   Fetal Heart Rate A   Mode External filed at 10/18/2022 1156  Baseline Rate (A) 150 bpm filed at 10/18/2022 1156  Variability 6-25 BPM filed at 10/18/2022 1156  Accelerations 15 x 15 filed at 10/18/2022 1156  Decelerations Prolonged filed at 10/18/2022 1156    Labs:  Results for orders placed or performed during the hospital encounter of 10/15/22 (from the past 24 hour(s))  Glucose, capillary   Collection Time: 10/17/22 11:18 AM  Result Value Ref Range   Glucose-Capillary 102 (H) 70 - 99 mg/dL  Glucose, capillary    Collection Time: 10/17/22  3:35 PM  Result Value Ref Range   Glucose-Capillary 108 (H) 70 - 99 mg/dL  Glucose, capillary   Collection Time: 10/17/22  9:04 PM  Result Value Ref Range   Glucose-Capillary 200 (H) 70 - 99 mg/dL  Glucose, capillary   Collection Time: 10/17/22 11:11 PM  Result Value Ref Range   Glucose-Capillary 300 (H) 70 - 99 mg/dL  Glucose, capillary   Collection Time: 10/18/22 12:15 AM  Result Value Ref Range   Glucose-Capillary 215 (H) 70 - 99 mg/dL  Glucose, capillary   Collection Time: 10/18/22  2:09 AM  Result Value Ref Range   Glucose-Capillary 156 (H) 70 - 99 mg/dL  Glucose, capillary   Collection Time: 10/18/22  5:42 AM  Result Value Ref Range   Glucose-Capillary 178 (H) 70 - 99 mg/dL     Medications:  Scheduled  docusate sodium  100 mg Oral Daily   insulin pump   Subcutaneous TID PC,HS,0200   metoCLOPramide (REGLAN) injection  5 mg Intravenous Q12H   NIFEdipine  30 mg Oral Daily   ondansetron (ZOFRAN) IV  4 mg Intravenous Q8H   pantoprazole (PROTONIX) IV  40 mg Intravenous Q24H   prenatal multivitamin  1 tablet Oral Q1200   scopolamine  1 patch Transdermal Once   scopolamine  1 patch Transdermal Q72H   sodium chloride flush  3 mL Intravenous Q12H   I have reviewed the patient's current medications.  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  Emeterio Reeve 10/18/2022,9:35 AM

## 2022-10-18 NOTE — Transfer of Care (Signed)
Immediate Anesthesia Transfer of Care Note  Patient: Sabrina Sutton  Procedure(s) Performed: CESAREAN SECTION  Patient Location: PACU  Anesthesia Type:Spinal  Level of Consciousness: awake, alert , and patient cooperative  Airway & Oxygen Therapy: Patient Spontanous Breathing  Post-op Assessment: Report given to RN and Post -op Vital signs reviewed and stable  Post vital signs: Reviewed and stable  Last Vitals:  Vitals Value Taken Time  BP    Temp    Pulse    Resp    SpO2      Last Pain:  Vitals:   10/18/22 1201  TempSrc: Oral  PainSc:       Patients Stated Pain Goal: 3 ([id number])  Complications: No notable events documented.

## 2022-10-18 NOTE — Anesthesia Preprocedure Evaluation (Signed)
Anesthesia Evaluation  Patient identified by MRN, date of birth, ID band Patient awake    Reviewed: Allergy & Precautions, H&P , NPO status , Patient's Chart, lab work & pertinent test results  Airway Mallampati: II  TM Distance: >3 FB Neck ROM: Full    Dental no notable dental hx.    Pulmonary neg pulmonary ROS, former smoker   Pulmonary exam normal breath sounds clear to auscultation       Cardiovascular negative cardio ROS Normal cardiovascular exam Rhythm:Regular Rate:Normal     Neuro/Psych negative neurological ROS  negative psych ROS   GI/Hepatic negative GI ROS, Neg liver ROS,,,  Endo/Other  negative endocrine ROSdiabetes    Renal/GU negative Renal ROS  negative genitourinary   Musculoskeletal negative musculoskeletal ROS (+)    Abdominal   Peds negative pediatric ROS (+)  Hematology negative hematology ROS (+)   Anesthesia Other Findings   Reproductive/Obstetrics (+) Pregnancy                             Anesthesia Physical Anesthesia Plan  ASA: 3 and emergent  Anesthesia Plan: Spinal   Post-op Pain Management:    Induction: Intravenous  PONV Risk Score and Plan: Treatment may vary due to age or medical condition  Airway Management Planned: Natural Airway  Additional Equipment:   Intra-op Plan:   Post-operative Plan:   Informed Consent: I have reviewed the patients History and Physical, chart, labs and discussed the procedure including the risks, benefits and alternatives for the proposed anesthesia with the patient or authorized representative who has indicated his/her understanding and acceptance.     Dental advisory given  Plan Discussed with: CRNA  Anesthesia Plan Comments:        Anesthesia Quick Evaluation

## 2022-10-18 NOTE — Anesthesia Postprocedure Evaluation (Signed)
Anesthesia Post Note  Patient: IT consultant  Procedure(s) Performed: CESAREAN SECTION     Patient location during evaluation: PACU Anesthesia Type: Spinal Level of consciousness: awake and alert Pain management: pain level controlled Vital Signs Assessment: post-procedure vital signs reviewed and stable Respiratory status: spontaneous breathing, nonlabored ventilation and respiratory function stable Cardiovascular status: blood pressure returned to baseline and stable Postop Assessment: no apparent nausea or vomiting Anesthetic complications: no   No notable events documented.  Last Vitals:  Vitals:   10/18/22 1615 10/18/22 1630  BP:    Pulse: (!) 122 (!) 118  Resp: 16 16  Temp:    SpO2: 100% 100%    Last Pain:  Vitals:   10/18/22 1505  TempSrc: Oral  PainSc:    Pain Goal: Patients Stated Pain Goal: 3 (10/15/22 2207)                 Lynda Rainwater

## 2022-10-18 NOTE — Progress Notes (Signed)
LABOR PROGRESS NOTE  Analy Sutton is a 27 y.o. G1P0 at [redacted]w[redacted]d  admitted for uncontrolled GDM1 and HG.  Subjective: Feeling contractions  Objective: BP 128/80 (BP Location: Right Arm)   Pulse (!) 108   Temp 97.8 F (36.6 C) (Oral)   Resp 17   Ht 5\' 7"  (1.702 m)   Wt 64.4 kg   LMP 02/09/2022 (Within Days)   SpO2 98%   BMI 22.24 kg/m  or  Vitals:   10/18/22 0539 10/18/22 0809 10/18/22 1009 10/18/22 1201  BP: 121/80 109/74 111/68 128/80  Pulse: 85 88 80 (!) 108  Resp: 17 16  17   Temp:  97.7 F (36.5 C)  97.8 F (36.6 C)  TempSrc:  Oral  Oral  SpO2:  99%  98%  Weight:      Height:         Dilation: 2 Effacement (%): 50 Cervical Position: Anterior Station: -2 Presentation: Vertex Exam by:: Clayton Lefort, MD FHT: baseline rate 150, moderate varibility, no acel, recurrent prolonged decel Toco: intermittent tachysystole  Labs: Lab Results  Component Value Date   WBC 9.9 10/15/2022   HGB 12.0 10/15/2022   HCT 35.8 (L) 10/15/2022   MCV 84.8 10/15/2022   PLT 300 10/15/2022    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 G1P0 at [redacted]w[redacted]d here for uncontrolled DM1 and HG.  Labor: patient was put on for NST this AM and noted to have recurrent prolonged decelerations. She was brought to L&D for monitoring where again she had recurrent late decelerations. Medical hx notable for DM1, FGR that has resolved to 16% on most recent scan, and fetus with small VSD.   Cervix rechecked to see if rapid  cervical change/descent was cause of FHR changes. Discussed with patient at this point we have an abnormal CST and non reassuring fetal status and we are remote from delivery. I recommended proceeding with primary cesarean, patient was in agreement.   The risks of cesarean section discussed with the patient included but were not limited to: bleeding which may require transfusion or reoperation; infection which may require antibiotics; injury to bowel, bladder, ureters or other surrounding organs; injury to the fetus; need for additional procedures including hysterectomy in the event of a life-threatening hemorrhage; placental abnormalities with subsequent pregnancies, incisional problems, thromboembolic phenomenon and other postoperative/anesthesia complications. The patient concurred with the proposed plan, giving informed written consent for the procedure. Patient NPO status waived given urgency of case. Anesthesia and OR aware. Preoperative prophylactic antibiotics and SCDs ordered on call to the OR.   Patient also desires hormonal IUD for contraception to be placed at the time of cesarean. Discussed risks of infection, irregular bleeding, increased cramping, expulsion, ectopic pregnancy.  At time of writing this note I wished to go urgently given ongoing recurrent prolonged decelerations. Another case has been called by a private physician just before mine. I discussed going with my case first given recurrent decels, as their patient's tracing had stabilized and was reassuring. However going both at the same time would mean all  OR's being in use and not having a crash room. Physician declined to allow our case to go first. I discussed with OR staff importance of turning over rooms as quickly as possible so that our case could go ASAP once a room was available, but we will go stat if needed before then.  Fetal Wellbeing:  Cat II Pain Control:  plan for spinal anesthesia GBS: unknown, ancef at time of  delivery Anticipated MOD:  cesarean  T1DM: on dexcom and omnipod  [name], MD/MPH Attending Family Medicine Physician, [facility] for [facility], [facility]   10/18/2022, 1:07 PM

## 2022-10-18 NOTE — Op Note (Signed)
Operative Note   Patient: Sabrina Sutton  Date of Procedure: 10/15/2022 - 10/18/2022  Procedure: Primary Low Transverse Cesarean and Postplacental hormonal IUD insertion    Indications: non-reassuring fetal status, maternal comorbidity: uncontrolled DM1, and contraception  Pre-operative Diagnosis: Fetal intolerance to labor.   Post-operative Diagnosis: Same  TOLAC Candidate: Yes   Surgeon: Surgeon(s) and Role:    * Darrious Youman, Annice Needy, MD - Primary    * Ndulue, Lyndel Safe, MD - Fellow  An experienced assistant was required given the standard of surgical care given the complexity of the case.  This assistant was needed for exposure, dissection, suctioning, retraction, instrument exchange, assisting with delivery with administration of fundal pressure, and for overall help during the procedure.   Anesthesia: spinal  Anesthesiologist: Lynda Rainwater, MD   Antibiotics: Cefazolin and Azithromycin   Estimated Blood Loss: 166 ml   Total IV Fluids: 1450 ml  Urine Output:  200 cc OF clear urine  Specimens: placenta to pathology   Complications:  conversion of case from urgent to STAT    Indications: Sabrina Sutton is a 27 y.o. G1P0101 with an IUP [redacted]w[redacted]d presenting for unscheduled, emergent cesarean secondary to the indications listed above. Clinical course notable for admission three days prior for hyperemesis gravidarum and uncontrolled DM1. On the day of delivery patient was placed on NST in the morning which was notable for recurrent prolonged decelerations and intermittent tachysystole. She was maintained on continuous EFM and due to their recurrent nature she was brought to L&D for monitoring. Upon arrival patient again had a prolonged deceleration and I recommended to patient that we proceed with primary cesarean for non-reassuring fetal status remote from delivery and essentially an abnormal CST. Patient was in agreement with this plan. She was also counseled on  contraception methods and elected to have a post-placental hormonal IUD placed at the time of cesarean.   The risks of cesarean section discussed with the patient included but were not limited to: bleeding which may require transfusion or reoperation; infection which may require antibiotics; injury to bowel, bladder, ureters or other surrounding organs; injury to the fetus; need for additional procedures including hysterectomy in the event of a life-threatening hemorrhage; placental abnormalities with subsequent pregnancies, incisional problems, thromboembolic phenomenon and other postoperative/anesthesia complications. The patient concurred with the proposed plan, giving informed written consent for the procedure. Patient NPO status waived given urgency of case. Anesthesia and OR aware. Preoperative prophylactic antibiotics and SCDs ordered on call to the OR.   In addition I consented the patient for post placental IUD insertion. Discussed risks of infection, irregular bleeding, increased cramping, expulsion, ectopic pregnancy.   At the time that I called the cesarean patient was continuing to have recurrent decelerations, a mixture of variables and lates, some of them prolonged. Another physician had called a case slightly before mine. On my review of the private patient's tracing that I could see on the nursing station there had been a prolonged period of reassuring tracing. I went to speak with the private physician and asked to go with my case first due to the ongoing concerns about the fetal tracing, but they declined.   Once the OR was ready I came into the operating room shortly after the patient arrived and asked for continuous fetal monitoring. Initially FHR and variability were reassuring in the 150's which had been her baseline. There was one deceleration during placement of the epidural down to the 100-110's but this quickly resolved. Upon placing patient on her  back there was a significant  deceleration into the 70's. Of note this is a position we had tried to avoid prior to going to the OR. After approximately 1 minute of observation FHR continued to drop into the 60's, at which point I asked for the case to be converted to STAT. A betadine splash prep was done and I proceeded with the case as outlined below.   Findings: Viable infant in cephalic presentation, no nuchal cord present. Apgars 2 , 7 , 8 . Weight 2280 g . Clear amniotic fluid. Normal placenta, three vessel cord. Normal uterus, Normal bilateral fallopian tubes, ovaries not visualized.  Procedure Details: A Time Out was held and the above information confirmed. The patient received intravenous antibiotics and had sequential compression devices applied to her lower extremities preoperatively. The patient was taken back to the operative suite where spinal anesthesia was administered. After induction of anesthesia, the patient was draped and prepped in the usual sterile manner and placed in a dorsal supine position with a leftward tilt. A low transverse skin incision was made with scalpel and carried down through the subcutaneous tissue to the fascia. Fascial incision was made and extended transversely. The fascia was separated from the underlying rectus tissue superiorly and inferiorly by blunt dissection. The rectus muscles were separated in the midline bluntly and the peritoneum was entered bluntly. An Alexis retractor was placed to aid in visualization of the uterus. A bladder flap was not developed. A low transverse uterine incision was made. Due to difficulty with space the skin incision was extended slightly on both sides, at which point infant was successfully delivered from cephalic presentation, the umbilical cord was clamped immediately. Cord ph was sent, and cord blood was obtained for evaluation. The placenta was removed Intact and appeared normal. At this point in the case Dr. Trina Ao arrived to the OR to assist me with the  case.  A Mirena IUD was then removed from it's packaging in a sterile manner and the strings were trimmed to approximately 10 cm. The IUD was placed manually at the uterine fundus and the strings were passed through the cervical os with a Kelly clamp.   The uterine incision was closed with a single layer running unlocked suture of 0-Monocryl. Overall, excellent hemostasis was noted. The abdomen and the pelvis were cleared of all clot and debris and the Ubaldo Glassing was removed. Hemostasis was confirmed on all surfaces.  The peritoneum was reapproximated using 2-0 vicryl . The fascia was then closed using 0 Vicryl in a running fashion. The subcutaneous layer was reapproximated with 2-0 plain gut suture. The skin was closed with a 4-0 vicryl subcuticular stitch. The patient tolerated the procedure well. Sponge, lap, instrument and needle counts were correct x 2. She was taken to the recovery room in stable condition.  Disposition: PACU - hemodynamically stable.    Signed: [name], MD/MPH Attending Family Medicine Physician, [facility] for [facility], [place]

## 2022-10-18 NOTE — Anesthesia Procedure Notes (Signed)
Spinal  Patient location during procedure: OB Start time: 10/18/2022 2:04 PM End time: 10/18/2022 2:09 PM Reason for block: surgical anesthesia Staffing Performed: anesthesiologist  Anesthesiologist: Lynda Rainwater, MD Performed by: Lynda Rainwater, MD Authorized by: Lynda Rainwater, MD   Preanesthetic Checklist Completed: patient identified, IV checked, risks and benefits discussed, surgical consent, monitors and equipment checked, pre-op evaluation and timeout performed Spinal Block Patient position: sitting Prep: DuraPrep and site prepped and draped Patient monitoring: heart rate, cardiac monitor, continuous pulse ox and blood pressure Approach: midline Location: L3-4 Injection technique: single-shot Needle Needle type: Pencan  Needle gauge: 24 G Needle length: 10 cm Assessment Sensory level: T4 Events: CSF return

## 2022-10-18 NOTE — Lactation Note (Signed)
This note was copied from a baby's chart.  NICU Lactation Consultation Note  Patient Name: Sabrina Sutton [id number] Date: 10/18/2022 Age:25 hours  Reason for consult: Initial assessment; Primapara; 1st time breastfeeding; Infant < 6lbs; NICU baby; Late-preterm 34-36.6wks; Maternal endocrine disorder  Subjective Visited with family of 25 hours old LPI NICU female; Ms. Berdine is a P1 and was already pumping when entered the room, praised her for her efforts. Assisted with hand expression, she was able to get small droplets of colostrum. Reviewed pumping schedule, lactogenesis II and anticipatory guidelines.   Objective Infant data: Mother's Current Feeding Choice: Breast Milk and Donor Milk  Maternal data: [id number]  C-Section, Low Transverse Has patient been taught Hand Expression?: Yes Hand Expression Comments: small droplets of colostrum noted Significant Breast History:: (+) breast changes during the pregnancy Current breast feeding challenges:: NICU admission Does the patient have breastfeeding experience prior to this delivery?: No Pumping frequency: initiated pumping at 3 hours post-partum Pumped volume: 0 mL (droplets) Flange Size: 24 Risk factor for low milk supply:: primipara, prematurity, infant separation  WIC Program: Yes WIC Referral Sent?: Yes What county?: [place]  Assessment Infant: In NICU  Maternal: Milk volume: Normal  Intervention/Plan Interventions: Interventions: Breast feeding basics reviewed; DEBP; Breast massage; Hand express; Education; [employer] brochure; Coconut oil Tools: Flanges; Pump; Coconut oil Pump Education: Setup, frequency, and cleaning; Milk Storage  Plan: Encouraged pumping every 3 hours; ideally 8 pumping sessions/24 hours Breast massage, hand expression and coconut oil were also encouraged prior pumping  FOB present and supportive. All questions and concerns answered, family to contact [facility] services PRN.  Consult  Status: NICU follow-up  NICU Follow-up type: New admission follow up   [place] 10/18/2022, 6:06 PM

## 2022-10-19 LAB — GLUCOSE, CAPILLARY
Glucose-Capillary: 104 mg/dL — ABNORMAL HIGH (ref 70–99)
Glucose-Capillary: 104 mg/dL — ABNORMAL HIGH (ref 70–99)
Glucose-Capillary: 105 mg/dL — ABNORMAL HIGH (ref 70–99)
Glucose-Capillary: 106 mg/dL — ABNORMAL HIGH (ref 70–99)
Glucose-Capillary: 107 mg/dL — ABNORMAL HIGH (ref 70–99)
Glucose-Capillary: 118 mg/dL — ABNORMAL HIGH (ref 70–99)
Glucose-Capillary: 122 mg/dL — ABNORMAL HIGH (ref 70–99)
Glucose-Capillary: 129 mg/dL — ABNORMAL HIGH (ref 70–99)
Glucose-Capillary: 131 mg/dL — ABNORMAL HIGH (ref 70–99)
Glucose-Capillary: 140 mg/dL — ABNORMAL HIGH (ref 70–99)
Glucose-Capillary: 173 mg/dL — ABNORMAL HIGH (ref 70–99)
Glucose-Capillary: 51 mg/dL — ABNORMAL LOW (ref 70–99)
Glucose-Capillary: 77 mg/dL (ref 70–99)
Glucose-Capillary: 85 mg/dL (ref 70–99)

## 2022-10-19 LAB — CBC
HCT: 30.9 % — ABNORMAL LOW (ref 36.0–46.0)
Hemoglobin: 11.1 g/dL — ABNORMAL LOW (ref 12.0–15.0)
MCH: 29.5 pg (ref 26.0–34.0)
MCHC: 35.9 g/dL (ref 30.0–36.0)
MCV: 82.2 fL (ref 80.0–100.0)
Platelets: 281 10*3/uL (ref 150–400)
RBC: 3.76 MIL/uL — ABNORMAL LOW (ref 3.87–5.11)
RDW: 13.7 % (ref 11.5–15.5)
WBC: 19.2 10*3/uL — ABNORMAL HIGH (ref 4.0–10.5)
nRBC: 0 % (ref 0.0–0.2)

## 2022-10-19 LAB — BASIC METABOLIC PANEL
Anion gap: 4 — ABNORMAL LOW (ref 5–15)
Anion gap: 6 (ref 5–15)
Anion gap: 8 (ref 5–15)
BUN: 6 mg/dL (ref 6–20)
BUN: 7 mg/dL (ref 6–20)
BUN: 7 mg/dL (ref 6–20)
CO2: 22 mmol/L (ref 22–32)
CO2: 19 mmol/L — ABNORMAL LOW (ref 22–32)
CO2: 18 mmol/L — ABNORMAL LOW (ref 22–32)
Calcium: 8.3 mg/dL — ABNORMAL LOW (ref 8.9–10.3)
Calcium: 8.3 mg/dL — ABNORMAL LOW (ref 8.9–10.3)
Calcium: 8.3 mg/dL — ABNORMAL LOW (ref 8.9–10.3)
Chloride: 104 mmol/L (ref 98–111)
Chloride: 106 mmol/L (ref 98–111)
Chloride: 105 mmol/L (ref 98–111)
Creatinine, Ser: 0.86 mg/dL (ref 0.44–1.00)
Creatinine, Ser: 0.78 mg/dL (ref 0.44–1.00)
Creatinine, Ser: 0.84 mg/dL (ref 0.44–1.00)
GFR, Estimated: >60 mL/min (ref >60)
GFR, Estimated: >60 mL/min (ref >60)
GFR, Estimated: >60 mL/min (ref >60)
Glucose, Bld: 119 mg/dL — ABNORMAL HIGH (ref 70–99)
Glucose, Bld: 99 mg/dL (ref 70–99)
Glucose, Bld: 155 mg/dL — ABNORMAL HIGH (ref 70–99)
Potassium: 3.5 mmol/L (ref 3.5–5.1)
Potassium: 3.8 mmol/L (ref 3.5–5.1)
Potassium: 3.9 mmol/L (ref 3.5–5.1)
Sodium: 130 mmol/L — ABNORMAL LOW (ref 135–145)
Sodium: 131 mmol/L — ABNORMAL LOW (ref 135–145)
Sodium: 131 mmol/L — ABNORMAL LOW (ref 135–145)

## 2022-10-19 MED ORDER — ONDANSETRON HCL 4 MG/2ML IJ SOLN: 4.0000 mg | Freq: Four times a day (QID) | INTRAMUSCULAR | Status: DC | PRN

## 2022-10-19 MED ORDER — INSULIN ASPART 100 UNIT/ML IJ SOLN
3.0000 [IU] | Freq: Three times a day (TID) | INTRAMUSCULAR | Status: DC
  Administered 2022-10-19 – 2022-10-21 (×7): 3 [IU] via SUBCUTANEOUS

## 2022-10-19 MED ORDER — INSULIN ASPART 100 UNIT/ML IJ SOLN
0.0000 [IU] | Freq: Three times a day (TID) | INTRAMUSCULAR | Status: DC
  Administered 2022-10-20: 1 [IU] via SUBCUTANEOUS
  Administered 2022-10-20: 3 [IU] via SUBCUTANEOUS
  Administered 2022-10-21 (×2): 1 [IU] via SUBCUTANEOUS

## 2022-10-19 MED ORDER — INSULIN GLARGINE-YFGN 100 UNIT/ML ~~LOC~~ SOLN
12.0000 [IU] | Freq: Every day | SUBCUTANEOUS | Status: DC
  Administered 2022-10-19 – 2022-10-22 (×4): 12 [IU] via SUBCUTANEOUS
  Filled 2022-10-19 (×4): qty 0.12

## 2022-10-19 MED ORDER — INSULIN ASPART 100 UNIT/ML IJ SOLN: 0.0000 [IU] | Freq: Every day | INTRAMUSCULAR | Status: DC

## 2022-10-19 NOTE — Progress Notes (Signed)
Subjective: Postpartum Day 1: Cesarean Delivery Patient reports tolerating PO.    Objective: Vital signs in last 24 hours: Temp:  [97.4 F (36.3 C)-98.5 F (36.9 C)] 97.8 F (36.6 C) (03/28 0800) Pulse Rate:  [75-129] 75 (03/28 0800) Resp:  [14-23] 16 (03/28 0800) BP: (106-135)/(48-88) 121/88 (03/28 0800) SpO2:  [98 %-100 %] 100 % (03/28 0800)  Physical Exam:  General: alert, cooperative, and no distress Lochia: appropriate Uterine Fundus: firm Incision: no significant drainage DVT Evaluation: No evidence of DVT seen on physical exam.  Recent Labs    10/19/22 0418  HGB 11.1*  HCT 30.9*  CBG (last 3)  Recent Labs    10/19/22 0624 10/19/22 0729 10/19/22 0853  GLUCAP 107* 173* 122*     Assessment/Plan: Status post Cesarean section. Doing well postoperatively.  Inpatient Diabetes Program Recommendations:     When ready to transition: - Semglee 12 units two hours prior to discontinuation then QD to follow -Novolog 0-6 units TID & HS -Novolog 3 units TID (assuming patient is consuming >50% of meals)  Emeterio Reeve, MD 10/19/2022, 10:00 AM

## 2022-10-19 NOTE — Lactation Note (Signed)
This note was copied from a baby's chart.  NICU Lactation Consultation Note  Patient Name: Girl Kathlen Sprauer [id number] Date: 10/19/2022 Age:27 years  Reason for consult: Follow-up assessment; Primapara; 1st time breastfeeding; NICU baby; Infant < 6lbs; Late-preterm 34-36.6wks; Maternal endocrine disorder  Subjective Visited with family of 57 hours old LPI NICU female; Ms. Pulgarin is a P1 and reports she hasn't pump since the last time she saw [place]. Explained the importance of consistent pumping for the onset of lactogenesis II and to protect her supply. Resized her flanges to a # 21, they seem to be appropriate at this time. Reviewed pumping schedule, lactogenesis II and anticipatory guidelines.   Objective Infant data: Mother's Current Feeding Choice: Breast Milk and Donor Milk  Maternal data: [id number]  C-Section, Low Transverse Has patient been taught Hand Expression?: Yes Hand Expression Comments: small droplets of colostrum noted Significant Breast History:: (+) breast changes during the pregnancy Current breast feeding challenges:: NICU admission Does the patient have breastfeeding experience prior to this delivery?: No Pumping frequency: 1 time/24 hours Pumped volume: 0 mL (droplets) Flange Size: 21 Risk factor for low milk supply:: primipara, prematurity, infant separation  WIC Program: Yes WIC Referral Sent?: Yes What county?: [place]  Assessment Infant: Feeding Status: NPO  Maternal: Milk volume: Normal  Intervention/Plan Interventions: Interventions: Breast feeding basics reviewed; DEBP; Education Tools: Pump; Flanges; Coconut oil Pump Education: Setup, frequency, and cleaning; Milk Storage  Plan: Encouraged to start pumping consistently every 3 hours; ideally 8 pumping sessions/24 hours Breast massage, hand expression and coconut oil were also encouraged prior pumping   No other support person at this time. All questions and concerns answered, family to  contact [facility] services PRN.  Consult Status: NICU follow-up  NICU Follow-up type: Maternal D/C visit   Miryah Ralls Francene Boyers 10/19/2022, 1:34 PM

## 2022-10-19 NOTE — Progress Notes (Signed)
CSW attempted to meet with MOB to complete psychosocial assessment; however, MOB was not present in her room. CSW will check back at a later time.  Abundio Miu, [place] Worker [facility] [employer] Cell#: [phone]

## 2022-10-19 NOTE — Inpatient Diabetes Management (Signed)
Inpatient Diabetes Program Recommendations  AACE/ADA: New Consensus Statement on Inpatient Glycemic Control (2015)  Target Ranges:  Prepandial:   less than 140 mg/dL      Peak postprandial:   less than 180 mg/dL (1-2 hours)      Critically ill patients:  140 - 180 mg/dL   Lab Results  Component Value Date   GLUCAP 173 (H) 10/19/2022   HGBA1C 6.4 (H) 08/14/2022    Review of Glycemic Control  Latest Reference Range & Units 10/19/22 06:02 10/19/22 06:24 10/19/22 07:29  Glucose-Capillary 70 - 99 mg/dL 51 (L) 107 (H) 173 (H)  (L): Data is abnormally low (H): Data is abnormally high Diabetes history: DM1 Outpatient Diabetes medications: Omnipod insulin pump with Dexcom CGM Prior to pregnancy: Lantus 25 units QD, Novolog 8 units TID  Current orders for Inpatient glycemic control: IV insulin  Decadron 4 mg x 1  Inpatient Diabetes Program Recommendations:    When ready to transition: - Semglee 12 units two hours prior to discontinuation then QD to follow -Novolog 0-6 units TID & HS -Novolog 3 units TID (assuming patient is consuming >50% of meals)  Will plan for SQ injections at discharge until she follow up with Loree Fee NP outpatient endocrinology.   Thanks, Bronson Curb, MSN, RNC-OB Diabetes Coordinator [phone] (8a-5p)

## 2022-10-20 LAB — BASIC METABOLIC PANEL
Anion gap: 12 (ref 5–15)
BUN: 9 mg/dL (ref 6–20)
CO2: 19 mmol/L — ABNORMAL LOW (ref 22–32)
Calcium: 8.7 mg/dL — ABNORMAL LOW (ref 8.9–10.3)
Chloride: 99 mmol/L (ref 98–111)
Creatinine, Ser: 0.94 mg/dL (ref 0.44–1.00)
GFR, Estimated: >60 mL/min (ref >60)
Glucose, Bld: 272 mg/dL — ABNORMAL HIGH (ref 70–99)
Potassium: 3.5 mmol/L (ref 3.5–5.1)
Sodium: 130 mmol/L — ABNORMAL LOW (ref 135–145)

## 2022-10-20 LAB — GLUCOSE, CAPILLARY
Glucose-Capillary: 104 mg/dL — ABNORMAL HIGH (ref 70–99)
Glucose-Capillary: 169 mg/dL — ABNORMAL HIGH (ref 70–99)
Glucose-Capillary: 252 mg/dL — ABNORMAL HIGH (ref 70–99)
Glucose-Capillary: 75 mg/dL (ref 70–99)

## 2022-10-20 MED ORDER — NIFEDIPINE ER OSMOTIC RELEASE 30 MG PO TB24
30.0000 mg | ORAL_TABLET | Freq: Every day | ORAL | Status: DC
  Administered 2022-10-20 – 2022-10-22 (×3): 30 mg via ORAL
  Filled 2022-10-20 (×3): qty 1

## 2022-10-20 NOTE — Progress Notes (Signed)
Subjective: Postpartum Day 2: Cesarean Delivery due to PTL and NRFHT's. Type 1 DM   Patient has no complaints this morning. Denies HA or visual changes. Tolerating diet, ambulating and voiding without problems and good oral pain control  Objective: Vital signs in last 24 hours: Temp:  [97.6 F (36.4 C)-98.4 F (36.9 C)] 98.4 F (36.9 C) (03/29 0905) Pulse Rate:  [66-86] 77 (03/29 1142) Resp:  [16-18] 18 (03/29 0905) BP: (99-139)/(66-102) 110/75 (03/29 1142) SpO2:  [100 %] 100 % (03/29 0905)  Physical Exam:  General: alert Lochia: appropriate Uterine Fundus: firm Incision: healing well DVT Evaluation: No evidence of DVT seen on physical exam.  Recent Labs    10/19/22 0418  HGB 11.1*  HCT 30.9*    Assessment/Plan: Status post Cesarean section. Doing well postoperatively.  Will add Procardia for BP control. Continue with Lasix. Continue with insulin, meal coverage and SSI for DM  Chancy Milroy, MD 10/20/2022, 1:06 PM

## 2022-10-20 NOTE — Clinical Social Work Maternal (Signed)
CLINICAL SOCIAL WORK MATERNAL/CHILD NOTE  Patient Details  Name: Sabrina Sutton MRN: [id number] Date of Birth: [birth year]  Date:  10/20/2022  Clinical Social Worker Initiating Note:  Abundio Miu, [place] Date/Time: Initiated:  10/20/22/1208     Child's Name:  Sabrina Sutton   Biological Parents:  Mother, Father (Father: Sabrina Sutton 12/03/90)   Need for Interpreter:  None   Reason for Referral:  Behavioral Health Concerns, Current Substance Use/Substance Use During Pregnancy  , Parental Support of Premature Babies < 32 weeks/or Critically Ill babies Flavia Shipper Score: 15)   Address:  [address] [place] [address]    Phone number:  [phone]  Additional phone number:   Household Members/Support Persons (HM/SP):   Household Member/Support Person 1, Household Member/Support Person 2   HM/SP Name Relationship DOB or Age  HM/SP -1 Sabrina Sutton mom    HM/SP -2   mom's husband    HM/SP -3        HM/SP -4        HM/SP -5        HM/SP -6        HM/SP -7        HM/SP -8          Natural Supports (not living in the home):      Professional Supports: None   Employment: Unemployed   Type of Work:     Education:  9 to 11 years (11th Grade)   Homebound arranged: No  Financial Resources:  [employer]   Other Resources:  [employer], Physicist, medical     Cultural/Religious Considerations Which May Impact Care:    Strengths:  Ability to meet basic needs  , Home prepared for child  , Understanding of illness, Psychotropic Medications   Psychotropic Medications:  Zoloft      Pediatrician:       Pediatrician List:   [place]      Pediatrician Fax Number:    Risk Factors/Current Problems:  Mental Health Concerns  , Substance Use     Cognitive State:  Alert  , Able to Concentrate  , Goal Oriented  , Linear Thinking  , Insightful     Mood/Affect:  Calm  ,  Happy  , Interested  , Comfortable  , Relaxed     CSW Assessment: CSW met with MOB at bedside to complete psychosocial assessment. CSW introduced self and explained role. MOB was welcoming, pleasant, and remained engaged during assessment. MOB reported that she resides with her mom and mom's husband. MOB reported that she has all items needed to care for infant including a car seat and bassinet. CSW inquired about MOB's support system, MOB reported that her mom is a support.   CSW inquired about MOB's mental health history. MOB reported that she has a history of depression, anxiety, and PTSD. MOB reported that she was diagnosed around 2019 or 2020 when Covid started. MOB shared that she was diagnosed with diabetes in 2017 and it played a role on her mental health. MOB shared that she is managing her diabetes well and reported that her medication helps. MOB endorsed having current symptoms of anxiety and shared that she had symptoms throughout her pregnancy. MOB described her anxiety as over thinking and having small panic attacks. MOB reported that she is prescribed and taking Sertraline which has been helpful. MOB  reported that her medication is managed by her OB provider. CSW inquired about any additional coping skills, MOB reported that she walks, colors, and started crocheting. CSW positively affirmed MOB's healthy coping skills. MOB reported that she participated in therapy in the past and is currently looking for a new therapist. CSW provided MOB with local mental health resources. CSW and MOB discussed edinburgh score 15. MOB attributed elevated score to stress due to conflict between her mom and FOB. CSW and MOB discussed utilizing therapy as a tool to address stress stemming from other parties. CSW encouraged MOB to follow up with local mental health resources. CSW inquired about how MOB was feeling emotionally since giving birth, MOB reported that she was feeling really good. MOB presented calm and did  not demonstrate any acute mental health signs/symptoms. CSW assessed for safety, MOB denied SI, HI, and domestic violence.   CSW provided education regarding the baby blues period vs. perinatal mood disorders, discussed treatment and gave resources for mental health follow up if concerns arise.  CSW recommends self-evaluation during the postpartum time period using the New Mom Checklist from Postpartum Progress and encouraged MOB to contact a medical professional if symptoms are noted at any time.    CSW and MOB discussed infant's NICU admission. CSW informed MOB about the NICU, what to expect, and supports available while infant is admitted to the NICU. MOB reported that she feels well informed about infant's care and shared that she feels that the staff members are doing a good job caring for infant. MOB denied any transportation barriers with visiting infant in the NICU. MOB denied any questions/concerns regarding the NICU.   CSW provided review of Sudden Infant Death Syndrome (SIDS) precautions.    CSW informed MOB about the hospital drug screen policy. MOB confirmed marijuana use and reported last use at the beginning of her pregnancy. MOB denied any additional substance use. CSW informed MOB that infant's UDS was negative and CDS would continue to be monitored and a CPS report would be made if warranted. MOB verbalized understanding and denied any questions.   CSW will continue to offer resources/supports while infant is admitted to the NICU as MOB opted for CSW to check in weekly.   CSW Plan/Description:  Sudden Infant Death Syndrome (SIDS) Education, Perinatal Mood and Anxiety Disorder (PMADs) Education, Psychosocial Support and Ongoing Assessment of Needs, Hospital Drug Screen Policy Information, CSW Will Continue to Monitor Umbilical Cord Tissue Drug Screen Results and Make Report if Warranted, Other Patient/Family Education, Other Information/Referral to [employer], [place] 10/20/2022, 12:12 PM

## 2022-10-21 LAB — CBC
HCT: 29.2 % — ABNORMAL LOW (ref 36.0–46.0)
Hemoglobin: 10.6 g/dL — ABNORMAL LOW (ref 12.0–15.0)
MCH: 29.9 pg (ref 26.0–34.0)
MCHC: 36.3 g/dL — ABNORMAL HIGH (ref 30.0–36.0)
MCV: 82.5 fL (ref 80.0–100.0)
Platelets: 295 10*3/uL (ref 150–400)
RBC: 3.54 MIL/uL — ABNORMAL LOW (ref 3.87–5.11)
RDW: 14.3 % (ref 11.5–15.5)
WBC: 8.9 10*3/uL (ref 4.0–10.5)
nRBC: 0 % (ref 0.0–0.2)

## 2022-10-21 LAB — URINALYSIS, ROUTINE W REFLEX MICROSCOPIC
Bacteria, UA: NONE SEEN
Bilirubin Urine: NEGATIVE
Glucose, UA: NEGATIVE mg/dL
Ketones, ur: NEGATIVE mg/dL
Nitrite: NEGATIVE
Protein, ur: NEGATIVE mg/dL
Specific Gravity, Urine: 1.008 (ref 1.005–1.030)
pH: 7 (ref 5.0–8.0)

## 2022-10-21 LAB — GLUCOSE, CAPILLARY
Glucose-Capillary: 129 mg/dL — ABNORMAL HIGH (ref 70–99)
Glucose-Capillary: 163 mg/dL — ABNORMAL HIGH (ref 70–99)
Glucose-Capillary: 72 mg/dL (ref 70–99)
Glucose-Capillary: 72 mg/dL (ref 70–99)
Glucose-Capillary: 73 mg/dL (ref 70–99)
Glucose-Capillary: 77 mg/dL (ref 70–99)

## 2022-10-21 NOTE — Progress Notes (Signed)
   Subjective: Postpartum Day 3: Cesarean Delivery due to PTL and NRFHT's. Type 1 DM  Patient has no complaints this morning. Tolerating diet. Ambulating voiding, + flatus and good oral pain control  Objective: Vital signs in last 24 hours: Temp:  [97.5 F (36.4 C)-98.1 F (36.7 C)] 98.1 F (36.7 C) (03/30 0835) Pulse Rate:  [74-80] 79 (03/30 0835) Resp:  [16-19] 17 (03/30 0835) BP: (110-129)/(75-88) 129/88 (03/30 0835) SpO2:  [100 %] 100 % (03/30 0835)  Physical Exam:  General: alert Lochia: appropriate Uterine Fundus: firm Incision: healing well DVT Evaluation: No evidence of DVT seen on physical exam.  Recent Labs    10/19/22 0418 10/21/22 0503  HGB 11.1* 10.6*  HCT 30.9* 29.2*    Assessment/Plan: Status post Cesarean section. Doing well postoperatively.   BP controlled with Procardia. DM stable with current insulin regiment. Will check UA today for ketones and repeat CMP tomorrow. Ion gap is closing and if continues will plan discharge tomorrow  Chancy Milroy, MD 10/21/2022, 9:53 AM

## 2022-10-21 NOTE — Progress Notes (Signed)
Patient alert and oriented x4. Patient denies hypoglycemic symptoms.

## 2022-10-21 NOTE — Lactation Note (Signed)
This note was copied from a baby's chart.  NICU Lactation Consultation Note  Patient Name: Girl Nakaia Tessmann [id number] Date: 10/21/2022 Age:25 hours  Reason for consult: Follow-up assessment; NICU baby; Late-preterm 34-36.6wks; Primapara; 1st time breastfeeding; Maternal endocrine disorder Type of Endocrine Disorder?: Diabetes  SUBJECTIVE  LC in to visit with P61 Mom of [redacted]w[redacted]d infant in the NICU.  Baby is on room air and getting scheduled NG feedings of donor breast milk.  Mom has held baby STS and encouraged frequent pumping and STS with baby.    Mom does wish to direct breastfeed when baby is showing PO readiness.    Mom provided with a hand's free pumping band and assisted with washing of pump parts.  LC noted water in the bottles from where the parts hadn't dried.  LC provided a bin for drying pump parts, instructing Mom to replace the paper towel each time.  Mom does not have a DEBP at home.  Mom interested in a STORK pump, request made. Mom aware of DEBP available for her in baby's room.  OBJECTIVE Infant data:  Infant feeding assessment Scale for Readiness: 3 Scale for Quality: -- (Attempted bottle feeding, was not interested.)   Maternal data: [id number]  C-Section, Low Transverse Pumping frequency: Not consistently, only 3-4 times since delivery.  Encouraged more consistency with a goal of 8 times per 24 hrs Pumped volume: 0 mL (drops) Flange Size: 21  WIC Program: Yes WIC Referral Sent?: Yes What county?: [place] Pump: Stork Pump (Scanned to emal the request)  ASSESSMENT Infant: Feeding Status: Scheduled 8-11-2-5  Maternal: Milk volume: Normal  INTERVENTIONS/PLAN Interventions: Interventions: Skin to skin; Breast massage; Hand express; Breast feeding basics reviewed; DEBP; Education Tools: Pump; Flanges; Coconut oil; Hands-free pumping top Pump Education: Setup, frequency, and cleaning; Milk Storage  Plan: Consult Status: NICU follow-up NICU Follow-up  type: New admission follow up   Broadus John 10/21/2022, 10:46 AM

## 2022-10-22 LAB — COMPREHENSIVE METABOLIC PANEL
ALT: 21 U/L (ref 0–44)
AST: 40 U/L (ref 15–41)
Albumin: 2.2 g/dL — ABNORMAL LOW (ref 3.5–5.0)
Alkaline Phosphatase: 120 U/L (ref 38–126)
Anion gap: 7 (ref 5–15)
BUN: 10 mg/dL (ref 6–20)
CO2: 25 mmol/L (ref 22–32)
Calcium: 8.7 mg/dL — ABNORMAL LOW (ref 8.9–10.3)
Chloride: 101 mmol/L (ref 98–111)
Creatinine, Ser: 0.87 mg/dL (ref 0.44–1.00)
GFR, Estimated: >60 mL/min (ref >60)
Glucose, Bld: 130 mg/dL — ABNORMAL HIGH (ref 70–99)
Potassium: 3.3 mmol/L — ABNORMAL LOW (ref 3.5–5.1)
Sodium: 133 mmol/L — ABNORMAL LOW (ref 135–145)
Total Bilirubin: 0.3 mg/dL (ref 0.3–1.2)
Total Protein: 5.2 g/dL — ABNORMAL LOW (ref 6.5–8.1)

## 2022-10-22 LAB — GLUCOSE, CAPILLARY
Glucose-Capillary: 139 mg/dL — ABNORMAL HIGH (ref 70–99)
Glucose-Capillary: 164 mg/dL — ABNORMAL HIGH (ref 70–99)

## 2022-10-22 LAB — SURGICAL PATHOLOGY

## 2022-10-22 MED ORDER — NIFEDIPINE ER 30 MG PO TB24: 30.0000 mg | ORAL_TABLET | Freq: Every day | ORAL | 1 refills | Status: DC

## 2022-10-22 MED ORDER — IBUPROFEN 600 MG PO TABS: 600.0000 mg | ORAL_TABLET | Freq: Four times a day (QID) | ORAL | 0 refills | Status: DC

## 2022-10-22 MED ORDER — INSULIN GLARGINE-YFGN 100 UNIT/ML ~~LOC~~ SOLN: 12.0000 [IU] | Freq: Every day | SUBCUTANEOUS | 11 refills | Status: DC

## 2022-10-22 MED ORDER — OXYCODONE-ACETAMINOPHEN 5-325 MG PO TABS: 1.0000 | ORAL_TABLET | ORAL | 0 refills | Status: DC | PRN

## 2022-10-22 MED ORDER — INSULIN ASPART 100 UNIT/ML IJ SOLN: 3.0000 [IU] | Freq: Three times a day (TID) | INTRAMUSCULAR | 11 refills | Status: DC

## 2022-10-23 ENCOUNTER — Ambulatory Visit: Payer: Medicaid Other

## 2022-10-24 ENCOUNTER — Telehealth (HOSPITAL_COMMUNITY): Payer: Self-pay | Admitting: *Deleted

## 2022-10-24 DIAGNOSIS — Z1331 Encounter for screening for depression: Secondary | ICD-10-CM

## 2022-10-24 NOTE — Telephone Encounter (Signed)
Inpatient EPDS=15. CSW consult completed while in hospital. Today nurse making ambulatory IBH referral. Dr. Nehemiah Settle notified via chart.  Odis Hollingshead, RN 10-24-2022 at 11:20am

## 2022-10-25 ENCOUNTER — Encounter: Payer: Medicaid Other | Admitting: Obstetrics and Gynecology

## 2022-10-25 ENCOUNTER — Ambulatory Visit: Payer: Medicaid Other

## 2022-10-26 ENCOUNTER — Other Ambulatory Visit: Payer: Self-pay

## 2022-10-26 DIAGNOSIS — E108 Type 1 diabetes mellitus with unspecified complications: Secondary | ICD-10-CM

## 2022-10-26 MED ORDER — DEXCOM G6 TRANSMITTER MISC: 11 refills | Status: DC

## 2022-10-26 MED ORDER — "INSULIN SYRINGE-NEEDLE U-100 28G X 5/16"" 1 ML MISC": 1.0000 | Freq: Every day | 5 refills | Status: DC

## 2022-10-27 ENCOUNTER — Ambulatory Visit: Payer: Medicaid Other

## 2022-10-28 ENCOUNTER — Encounter: Payer: Self-pay | Admitting: Obstetrics and Gynecology

## 2022-10-28 ENCOUNTER — Telehealth (HOSPITAL_COMMUNITY): Payer: Self-pay

## 2022-10-28 DIAGNOSIS — Z1331 Encounter for screening for depression: Secondary | ICD-10-CM

## 2022-10-28 NOTE — Progress Notes (Signed)
Received call from peripartum RN who called patient at home for check after c-section, noting she had [place] depression scale of 6 with a "hardly ever" to thoughts of self-harm. She reviewed mental health resources with patient, who affirmed she would seek care if she felt she wanted to harm herself. Agree with this plan. Patient was no show for incision check yesterday, I will message office to ensure she gets in early this week to be seen.   Baldemar Lenis, MD, [facility] Attending Center for [employer] [facility])

## 2022-10-28 NOTE — Telephone Encounter (Signed)
Patient reports feeling good. Patient declines questions/concerns about her health and healing.  Baby currently in [facility] NICU.  EPDS score is 6. Question # 10 = 1 Hardly Ever. RN called Dr. Earlene Plater and reviewed EPDS information. Integrated behavioral health referral in place. RN told patient about Maternal Mental Health Resources ([facility], National Maternal Mental Health Hotline, Postpartum Support International). Educated patient about if having feelings of self harm or harming others to come to the emergency room or call 911. Also told patient about [facility] support group offerings. Will e-mail these resources to patient as well.  Suann Larry [facility]  Perinatal Services   10/28/22,1242

## 2022-10-30 ENCOUNTER — Ambulatory Visit: Payer: Medicaid Other

## 2022-10-30 ENCOUNTER — Telehealth: Payer: Self-pay | Admitting: *Deleted

## 2022-10-30 ENCOUNTER — Other Ambulatory Visit (HOSPITAL_COMMUNITY): Payer: Self-pay

## 2022-10-30 ENCOUNTER — Telehealth: Payer: Self-pay

## 2022-10-30 NOTE — Telephone Encounter (Signed)
TC from pt reporting plastic IUD strings hanging out when she wiped. Pt scheduled for incision and BP check on 4/10. Message sent to schedulers to chang appt to provider visit. Advised pt visit would be with provider so that IUD placement could be checked along with BP and incision. Pt verbalized understanding.

## 2022-10-30 NOTE — Telephone Encounter (Signed)
Prior Auth for patients medication DEXCOME G6 SENSOR approved by [facility] [employer] from 10/30/22 to 05/01/23.  CoverMyMeds Key: [id number] [place] Case ID #: [id number]

## 2022-10-30 NOTE — Telephone Encounter (Signed)
A Prior Authorization was initiated for this patients DEXCOM G6 SENSORS through CoverMyMeds.   Key: [id number]

## 2022-10-31 ENCOUNTER — Ambulatory Visit: Payer: Medicaid Other | Admitting: Nurse Practitioner

## 2022-10-31 DIAGNOSIS — E109 Type 1 diabetes mellitus without complications: Secondary | ICD-10-CM

## 2022-11-01 ENCOUNTER — Ambulatory Visit: Payer: Medicaid Other

## 2022-11-01 ENCOUNTER — Ambulatory Visit (INDEPENDENT_AMBULATORY_CARE_PROVIDER_SITE_OTHER): Payer: Medicaid Other

## 2022-11-01 ENCOUNTER — Encounter: Payer: Self-pay | Admitting: Family Medicine

## 2022-11-01 VITALS — BP 114/83 | HR 103 | Wt 117.0 lb

## 2022-11-01 DIAGNOSIS — Z30431 Encounter for routine checking of intrauterine contraceptive device: Secondary | ICD-10-CM | POA: Diagnosis not present

## 2022-11-01 DIAGNOSIS — Z98891 History of uterine scar from previous surgery: Secondary | ICD-10-CM | POA: Diagnosis not present
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
## 2022-11-01 NOTE — Progress Notes (Signed)
Pt presents 2 weeks post C-section.  Had IUD placed post delivery. Concerned for long IUD strings. BP normal in office today.

## 2022-11-02 ENCOUNTER — Encounter: Payer: Self-pay | Admitting: Certified Nurse Midwife

## 2022-11-03 ENCOUNTER — Ambulatory Visit (HOSPITAL_COMMUNITY): Payer: Self-pay

## 2022-11-03 NOTE — Lactation Note (Signed)
This note was copied from a baby's chart.  NICU Lactation Consultation Note  Patient Name: Sabrina Sutton [id number] Date: 11/03/2022 Age:25 wk.o.  Reason for consult: Weekly NICU follow-up; NICU baby; Primapara; 1st time breastfeeding; Maternal endocrine disorder; Exclusive pumping and bottle feeding; Early term 37-38.6wks Type of Endocrine Disorder?: Diabetes (T1DM)  SUBJECTIVE Visited with family of 78 37/76 weeks old AGA NICU female; Sabrina Sutton is a P1 and reports she's still pumping but not consistently. Noticed that last breastmilk feeding for baby was on 10/24/22; she voiced that she has her breastmilk at home but that she forgot to bring it in today. Her plan is to exclusively pump and bottle feed with her EBM and formula. Reviewed pumping schedule, lactogenesis III and strategies to increase supply.  OBJECTIVE Infant data: Mother's Current Feeding Choice: Breast Milk and Formula  Infant feeding assessment Scale for Readiness: 2 Scale for Quality: 4   Maternal data: G1P0101  C-Section, Low Transverse Pumping frequency: 3-4 times/24 hours Pumped volume: 30 mL (30-60 ml per MOB) Flange Size: 21  WIC Program: Yes WIC Referral Sent?: Yes What county?: [place] Pump: [place] Pump (Scanned to [employer] the request)  ASSESSMENT Infant: Feeding Status: Scheduled 8-11-2-5  Maternal: No data recorded INTERVENTIONS/PLAN Interventions: Interventions: Breast feeding basics reviewed; DEBP; Education; Coconut oil Tools: Flanges; Pump; Coconut oil; Hands-free pumping top (Size S/M) Pump Education: Setup, frequency, and cleaning; Milk Storage  Plan: Encouraged to start pumping consistently every 3 hours; ideally 8 pumping sessions/24 hours Power pumping in the AM was also encouraged She'll start bringing her breastmilk to the [place], she has some transportation barriers at this time   No other support person at this time. All questions and concerns answered, family to contact  Sabrina Sutton services PRN.  Consult Status: NICU follow-up NICU Follow-up type: Weekly NICU follow up   Sabrina Sutton Sabrina Sutton 11/03/2022, 6:38 PM

## 2022-11-11 ENCOUNTER — Other Ambulatory Visit: Payer: Self-pay

## 2022-11-11 ENCOUNTER — Inpatient Hospital Stay (HOSPITAL_COMMUNITY)
Admission: EM | Admit: 2022-11-11 | Discharge: 2022-11-17 | DRG: 776 | Disposition: A | Discharge status: Home / Self Care | Payer: Medicaid Other | Attending: Family Medicine | Admitting: Family Medicine

## 2022-11-11 DIAGNOSIS — Z820 Family history of epilepsy and other diseases of the nervous system: Secondary | ICD-10-CM

## 2022-11-11 DIAGNOSIS — R112 Nausea with vomiting, unspecified: Secondary | ICD-10-CM | POA: Diagnosis not present

## 2022-11-11 DIAGNOSIS — Z8249 Family history of ischemic heart disease and other diseases of the circulatory system: Secondary | ICD-10-CM

## 2022-11-11 DIAGNOSIS — E1010 Type 1 diabetes mellitus with ketoacidosis without coma: Secondary | ICD-10-CM

## 2022-11-11 DIAGNOSIS — E1065 Type 1 diabetes mellitus with hyperglycemia: Secondary | ICD-10-CM | POA: Diagnosis present

## 2022-11-11 DIAGNOSIS — E109 Type 1 diabetes mellitus without complications: Secondary | ICD-10-CM | POA: Diagnosis present

## 2022-11-11 DIAGNOSIS — E1165 Type 2 diabetes mellitus with hyperglycemia: Secondary | ICD-10-CM | POA: Diagnosis not present

## 2022-11-11 DIAGNOSIS — R110 Nausea: Secondary | ICD-10-CM | POA: Diagnosis not present

## 2022-11-11 DIAGNOSIS — Z87891 Personal history of nicotine dependence: Secondary | ICD-10-CM

## 2022-11-11 DIAGNOSIS — Z79899 Other long term (current) drug therapy: Secondary | ICD-10-CM

## 2022-11-11 DIAGNOSIS — R739 Hyperglycemia, unspecified: Secondary | ICD-10-CM | POA: Diagnosis present

## 2022-11-11 DIAGNOSIS — F419 Anxiety disorder, unspecified: Secondary | ICD-10-CM | POA: Diagnosis present

## 2022-11-11 DIAGNOSIS — Z823 Family history of stroke: Secondary | ICD-10-CM

## 2022-11-11 DIAGNOSIS — F53 Postpartum depression: Secondary | ICD-10-CM | POA: Diagnosis present

## 2022-11-11 DIAGNOSIS — Z91199 Patient's noncompliance with other medical treatment and regimen due to unspecified reason: Secondary | ICD-10-CM

## 2022-11-11 DIAGNOSIS — Z833 Family history of diabetes mellitus: Secondary | ICD-10-CM

## 2022-11-11 DIAGNOSIS — E10649 Type 1 diabetes mellitus with hypoglycemia without coma: Secondary | ICD-10-CM | POA: Diagnosis not present

## 2022-11-11 DIAGNOSIS — O2403 Pre-existing diabetes mellitus, type 1, in the puerperium: Principal | ICD-10-CM | POA: Diagnosis present

## 2022-11-11 DIAGNOSIS — Z98891 History of uterine scar from previous surgery: Secondary | ICD-10-CM

## 2022-11-11 DIAGNOSIS — Z9641 Presence of insulin pump (external) (internal): Secondary | ICD-10-CM | POA: Diagnosis present

## 2022-11-11 DIAGNOSIS — Z8349 Family history of other endocrine, nutritional and metabolic diseases: Secondary | ICD-10-CM

## 2022-11-11 DIAGNOSIS — R000 Tachycardia, unspecified: Secondary | ICD-10-CM | POA: Diagnosis not present

## 2022-11-11 DIAGNOSIS — E876 Hypokalemia: Secondary | ICD-10-CM | POA: Diagnosis present

## 2022-11-11 DIAGNOSIS — E108 Type 1 diabetes mellitus with unspecified complications: Secondary | ICD-10-CM | POA: Diagnosis present

## 2022-11-11 DIAGNOSIS — R1111 Vomiting without nausea: Secondary | ICD-10-CM | POA: Diagnosis not present

## 2022-11-11 DIAGNOSIS — O99345 Other mental disorders complicating the puerperium: Secondary | ICD-10-CM | POA: Diagnosis present

## 2022-11-11 DIAGNOSIS — N179 Acute kidney failure, unspecified: Secondary | ICD-10-CM | POA: Diagnosis present

## 2022-11-11 DIAGNOSIS — Z794 Long term (current) use of insulin: Secondary | ICD-10-CM

## 2022-11-11 LAB — CBG MONITORING, ED
Glucose-Capillary: 521 mg/dL (ref 70–99)
Glucose-Capillary: 555 mg/dL (ref 70–99)

## 2022-11-11 MED ORDER — FENTANYL CITRATE PF 50 MCG/ML IJ SOSY
50.0000 ug | PREFILLED_SYRINGE | Freq: Once | INTRAMUSCULAR | Status: AC
  Administered 2022-11-11: 50 ug via INTRAVENOUS
  Filled 2022-11-11: qty 1

## 2022-11-11 MED ORDER — LACTATED RINGERS IV BOLUS
1000.0000 mL | Freq: Once | INTRAVENOUS | Status: AC
  Administered 2022-11-12: 1000 mL via INTRAVENOUS

## 2022-11-11 MED ORDER — PROCHLORPERAZINE EDISYLATE 10 MG/2ML IJ SOLN
10.0000 mg | Freq: Once | INTRAMUSCULAR | Status: AC
  Administered 2022-11-11: 10 mg via INTRAVENOUS
  Filled 2022-11-11: qty 2

## 2022-11-11 MED ORDER — INSULIN ASPART 100 UNIT/ML IJ SOLN
10.0000 [IU] | Freq: Once | INTRAMUSCULAR | Status: AC
  Administered 2022-11-11: 10 [IU] via INTRAVENOUS

## 2022-11-11 NOTE — ED Provider Notes (Signed)
[place] EMERGENCY DEPARTMENT AT [facility] Provider Note   CSN: [id number] Arrival date & time: 11/11/22  2303     History {Add pertinent medical, surgical, social history, OB history to HPI:1} No chief complaint on file.   Sabrina Sutton is a 27 y.o. female.  27 year old female who presents via EMS secondary to being emotionally distraught and have hyperglycemia with nausea vomiting.  Unable to obtain full history at this time secondary to patient's emotional condition.  Or EMS the patient last took her insulin at 8 PM.  She had a blood sugar of 488 with them.        Home Medications Prior to Admission medications   Medication Sig Start Date End Date Taking? Authorizing Provider  Continuous Blood Gluc Transmit (DEXCOM G6 TRANSMITTER) MISC INJECT 1 DEVICE UNDER THE SKIN AS DIRECTED UP TO 8 TIMES WITH EACH NEW SENSOR 10/26/22   Anyanwu, Jethro Bastos, MD  ibuprofen (ADVIL) 600 MG tablet Take 1 tablet (600 mg total) by mouth every 6 (six) hours. 10/22/22   Hermina Staggers, MD  insulin aspart (NOVOLOG) 100 UNIT/ML injection Inject 3 Units into the skin 3 (three) times daily with meals. 10/22/22   Hermina Staggers, MD  insulin glargine-yfgn (SEMGLEE) 100 UNIT/ML injection Inject 0.12 mLs (12 Units total) into the skin daily. 10/23/22   Hermina Staggers, MD  Insulin Syringe-Needle U-100 28G X 5/16" 1 ML MISC 1 Needle by Does not apply route daily. 10/26/22   Hermina Staggers, MD  NIFEdipine (ADALAT CC) 30 MG 24 hr tablet Take 1 tablet (30 mg total) by mouth daily. 10/23/22   Hermina Staggers, MD  oxyCODONE-acetaminophen (PERCOCET/ROXICET) 5-325 MG tablet Take 1 tablet by mouth every 4 (four) hours as needed. 10/22/22   Hermina Staggers, MD  sertraline (ZOLOFT) 50 MG tablet Take 1 tablet (50 mg total) by mouth daily. 08/12/22 01/09/23  Judeth Horn, NP      Allergies    Patient has no known allergies.    Review of Systems   Review of Systems  Physical Exam Updated Vital  Signs There were no vitals taken for this visit. Physical Exam Vitals and nursing note reviewed.  Constitutional:      Appearance: She is well-developed.  HENT:     Head: Normocephalic and atraumatic.  Eyes:     Pupils: Pupils are equal, round, and reactive to light.  Cardiovascular:     Rate and Rhythm: Normal rate and regular rhythm.  Pulmonary:     Effort: No respiratory distress.     Breath sounds: No stridor.  Abdominal:     General: Abdomen is flat. There is no distension.  Musculoskeletal:        General: No swelling or tenderness. Normal range of motion.     Cervical back: Normal range of motion.  Skin:    General: Skin is warm and dry.  Neurological:     General: No focal deficit present.     Mental Status: She is alert.     ED Results / Procedures / Treatments   Labs (all labs ordered are listed, but only abnormal results are displayed) Labs Reviewed  URINALYSIS, ROUTINE W REFLEX MICROSCOPIC  BETA-HYDROXYBUTYRIC ACID  BLOOD GAS, VENOUS  LIPASE, BLOOD  CBC WITH DIFFERENTIAL/PLATELET  COMPREHENSIVE METABOLIC PANEL  CBG MONITORING, ED  I-STAT BETA HCG BLOOD, ED ([facility], [facility], [facility] ONLY)    EKG None  Radiology No results found.  Procedures Procedures  {Document cardiac monitor, telemetry  assessment procedure when appropriate:1}  Medications Ordered in ED Medications  lactated ringers bolus 1,000 mL (has no administration in time range)  insulin aspart (novoLOG) injection 10 Units (has no administration in time range)  prochlorperazine (COMPAZINE) injection 10 mg (has no administration in time range)  fentaNYL (SUBLIMAZE) injection 50 mcg (has no administration in time range)    ED Course/ Medical Decision Making/ A&P   {   Click here for ABCD2, HEART and other calculatorsREFRESH Note before signing :1}                          Medical Decision Making Amount and/or Complexity of Data Reviewed Labs: ordered.  Risk Prescription drug  management.   ***  {Document critical care time when appropriate:1} {Document review of labs and clinical decision tools ie heart score, Chads2Vasc2 etc:1}  {Document your independent review of radiology images, and any outside records:1} {Document your discussion with family members, caretakers, and with consultants:1} {Document social determinants of health affecting pt's care:1} {Document your decision making why or why not admission, treatments were needed:1} Final Clinical Impression(s) / ED Diagnoses Final diagnoses:  None    Rx / DC Orders ED Discharge Orders     None

## 2022-11-11 NOTE — ED Triage Notes (Signed)
Pt arrives EMS from home with reports of emesis and abdominal pain for the last few hours. Pt hx of type 1 DM. Pt arrives with CBG of 521. Pt reports taking 25 units of novolog around 8pm prior to arrival. Pt reports taking 25 units around 1pm as well.

## 2022-11-12 DIAGNOSIS — E109 Type 1 diabetes mellitus without complications: Secondary | ICD-10-CM | POA: Diagnosis present

## 2022-11-12 DIAGNOSIS — R112 Nausea with vomiting, unspecified: Secondary | ICD-10-CM

## 2022-11-12 DIAGNOSIS — R739 Hyperglycemia, unspecified: Secondary | ICD-10-CM | POA: Diagnosis present

## 2022-11-12 DIAGNOSIS — E1065 Type 1 diabetes mellitus with hyperglycemia: Secondary | ICD-10-CM | POA: Diagnosis present

## 2022-11-12 LAB — BASIC METABOLIC PANEL
Anion gap: 13 (ref 5–15)
Anion gap: 13 (ref 5–15)
Anion gap: 9 (ref 5–15)
Anion gap: 14 (ref 5–15)
BUN: 11 mg/dL (ref 6–20)
BUN: 9 mg/dL (ref 6–20)
BUN: 7 mg/dL (ref 6–20)
BUN: 6 mg/dL (ref 6–20)
CO2: 22 mmol/L (ref 22–32)
CO2: 25 mmol/L (ref 22–32)
CO2: 24 mmol/L (ref 22–32)
CO2: 22 mmol/L (ref 22–32)
Calcium: 8.8 mg/dL — ABNORMAL LOW (ref 8.9–10.3)
Calcium: 9.3 mg/dL (ref 8.9–10.3)
Calcium: 8.8 mg/dL — ABNORMAL LOW (ref 8.9–10.3)
Calcium: 9.4 mg/dL (ref 8.9–10.3)
Chloride: 98 mmol/L (ref 98–111)
Chloride: 99 mmol/L (ref 98–111)
Chloride: 101 mmol/L (ref 98–111)
Chloride: 100 mmol/L (ref 98–111)
Creatinine, Ser: 0.87 mg/dL (ref 0.44–1.00)
Creatinine, Ser: 0.88 mg/dL (ref 0.44–1.00)
Creatinine, Ser: 0.79 mg/dL (ref 0.44–1.00)
Creatinine, Ser: 0.81 mg/dL (ref 0.44–1.00)
GFR, Estimated: >60 mL/min (ref >60)
GFR, Estimated: >60 mL/min (ref >60)
GFR, Estimated: >60 mL/min (ref >60)
GFR, Estimated: >60 mL/min (ref >60)
Glucose, Bld: 181 mg/dL — ABNORMAL HIGH (ref 70–99)
Glucose, Bld: 152 mg/dL — ABNORMAL HIGH (ref 70–99)
Glucose, Bld: 161 mg/dL — ABNORMAL HIGH (ref 70–99)
Glucose, Bld: 183 mg/dL — ABNORMAL HIGH (ref 70–99)
Potassium: 3.6 mmol/L (ref 3.5–5.1)
Potassium: 3.2 mmol/L — ABNORMAL LOW (ref 3.5–5.1)
Potassium: 3.7 mmol/L (ref 3.5–5.1)
Potassium: 4 mmol/L (ref 3.5–5.1)
Sodium: 133 mmol/L — ABNORMAL LOW (ref 135–145)
Sodium: 137 mmol/L (ref 135–145)
Sodium: 134 mmol/L — ABNORMAL LOW (ref 135–145)
Sodium: 136 mmol/L (ref 135–145)

## 2022-11-12 LAB — CBG MONITORING, ED
Glucose-Capillary: 414 mg/dL — ABNORMAL HIGH (ref 70–99)
Glucose-Capillary: 322 mg/dL — ABNORMAL HIGH (ref 70–99)
Glucose-Capillary: 405 mg/dL — ABNORMAL HIGH (ref 70–99)
Glucose-Capillary: 223 mg/dL — ABNORMAL HIGH (ref 70–99)
Glucose-Capillary: 200 mg/dL — ABNORMAL HIGH (ref 70–99)
Glucose-Capillary: 203 mg/dL — ABNORMAL HIGH (ref 70–99)
Glucose-Capillary: 170 mg/dL — ABNORMAL HIGH (ref 70–99)
Glucose-Capillary: 155 mg/dL — ABNORMAL HIGH (ref 70–99)
Glucose-Capillary: 146 mg/dL — ABNORMAL HIGH (ref 70–99)
Glucose-Capillary: 211 mg/dL — ABNORMAL HIGH (ref 70–99)
Glucose-Capillary: 196 mg/dL — ABNORMAL HIGH (ref 70–99)
Glucose-Capillary: 156 mg/dL — ABNORMAL HIGH (ref 70–99)
Glucose-Capillary: 245 mg/dL — ABNORMAL HIGH (ref 70–99)
Glucose-Capillary: 222 mg/dL — ABNORMAL HIGH (ref 70–99)
Glucose-Capillary: 147 mg/dL — ABNORMAL HIGH (ref 70–99)
Glucose-Capillary: 152 mg/dL — ABNORMAL HIGH (ref 70–99)
Glucose-Capillary: 174 mg/dL — ABNORMAL HIGH (ref 70–99)

## 2022-11-12 LAB — CBC WITH DIFFERENTIAL/PLATELET
Abs Immature Granulocytes: 0 10*3/uL (ref 0.00–0.07)
Basophils Absolute: 0 10*3/uL (ref 0.0–0.1)
Basophils Relative: 0 %
Eosinophils Absolute: 0 10*3/uL (ref 0.0–0.5)
Eosinophils Relative: 0 %
HCT: 38.6 % (ref 36.0–46.0)
Hemoglobin: 13.2 g/dL (ref 12.0–15.0)
Lymphocytes Relative: 6 %
Lymphs Abs: 1.1 10*3/uL (ref 0.7–4.0)
MCH: 29.3 pg (ref 26.0–34.0)
MCHC: 34.2 g/dL (ref 30.0–36.0)
MCV: 85.6 fL (ref 80.0–100.0)
Monocytes Absolute: 0.9 10*3/uL (ref 0.1–1.0)
Monocytes Relative: 5 %
Neutro Abs: 16 10*3/uL — ABNORMAL HIGH (ref 1.7–7.7)
Neutrophils Relative %: 89 %
Platelets: 482 10*3/uL — ABNORMAL HIGH (ref 150–400)
RBC: 4.51 MIL/uL (ref 3.87–5.11)
RDW: 13.8 % (ref 11.5–15.5)
WBC: 18 10*3/uL — ABNORMAL HIGH (ref 4.0–10.5)
nRBC: 0 % (ref 0.0–0.2)

## 2022-11-12 LAB — COMPREHENSIVE METABOLIC PANEL
ALT: 25 U/L (ref 0–44)
ALT: 30 U/L (ref 0–44)
AST: 35 U/L (ref 15–41)
AST: 39 U/L (ref 15–41)
Albumin: 3.1 g/dL — ABNORMAL LOW (ref 3.5–5.0)
Albumin: 4.3 g/dL (ref 3.5–5.0)
Alkaline Phosphatase: 119 U/L (ref 38–126)
Alkaline Phosphatase: 88 U/L (ref 38–126)
Anion gap: 17 — ABNORMAL HIGH (ref 5–15)
Anion gap: 26 — ABNORMAL HIGH (ref 5–15)
BUN: 12 mg/dL (ref 6–20)
BUN: 15 mg/dL (ref 6–20)
CO2: 16 mmol/L — ABNORMAL LOW (ref 22–32)
CO2: 21 mmol/L — ABNORMAL LOW (ref 22–32)
Calcium: 9.2 mg/dL (ref 8.9–10.3)
Calcium: 9.8 mg/dL (ref 8.9–10.3)
Chloride: 89 mmol/L — ABNORMAL LOW (ref 98–111)
Chloride: 96 mmol/L — ABNORMAL LOW (ref 98–111)
Creatinine, Ser: 1.2 mg/dL — ABNORMAL HIGH (ref 0.44–1.00)
Creatinine, Ser: 1.41 mg/dL — ABNORMAL HIGH (ref 0.44–1.00)
GFR, Estimated: 53 mL/min — ABNORMAL LOW (ref >60)
GFR, Estimated: >60 mL/min (ref >60)
Glucose, Bld: 237 mg/dL — ABNORMAL HIGH (ref 70–99)
Glucose, Bld: 580 mg/dL (ref 70–99)
Potassium: 3.4 mmol/L — ABNORMAL LOW (ref 3.5–5.1)
Potassium: 3.6 mmol/L (ref 3.5–5.1)
Sodium: 131 mmol/L — ABNORMAL LOW (ref 135–145)
Sodium: 134 mmol/L — ABNORMAL LOW (ref 135–145)
Total Bilirubin: 0.6 mg/dL (ref 0.3–1.2)
Total Bilirubin: 1.7 mg/dL — ABNORMAL HIGH (ref 0.3–1.2)
Total Protein: 6.2 g/dL — ABNORMAL LOW (ref 6.5–8.1)
Total Protein: 8.2 g/dL — ABNORMAL HIGH (ref 6.5–8.1)

## 2022-11-12 LAB — URINALYSIS, ROUTINE W REFLEX MICROSCOPIC
Bilirubin Urine: NEGATIVE
Glucose, UA: >=500 mg/dL — AB
Hgb urine dipstick: NEGATIVE
Ketones, ur: 80 mg/dL — AB
Leukocytes,Ua: NEGATIVE
Nitrite: NEGATIVE
Protein, ur: 30 mg/dL — AB
Specific Gravity, Urine: 1.023 (ref 1.005–1.030)
pH: 6 (ref 5.0–8.0)

## 2022-11-12 LAB — I-STAT BETA HCG BLOOD, ED (MC, WL, AP ONLY)
I-stat hCG, quantitative: 12.1 m[IU]/mL — ABNORMAL HIGH (ref <5)
I-stat hCG, quantitative: 6.1 m[IU]/mL — ABNORMAL HIGH (ref <5)

## 2022-11-12 LAB — I-STAT VENOUS BLOOD GAS, ED
Acid-base deficit: 2 mmol/L (ref 0.0–2.0)
Bicarbonate: 20.4 mmol/L (ref 20.0–28.0)
Calcium, Ion: 1.07 mmol/L — ABNORMAL LOW (ref 1.15–1.40)
HCT: 44 % (ref 36.0–46.0)
Hemoglobin: 15 g/dL (ref 12.0–15.0)
O2 Saturation: 80 %
Potassium: 3.7 mmol/L (ref 3.5–5.1)
Sodium: 130 mmol/L — ABNORMAL LOW (ref 135–145)
TCO2: 21 mmol/L — ABNORMAL LOW (ref 22–32)
pCO2, Ven: 29.5 mmHg — ABNORMAL LOW (ref 44–60)
pH, Ven: 7.449 — ABNORMAL HIGH (ref 7.25–7.43)
pO2, Ven: 42 mmHg (ref 32–45)

## 2022-11-12 LAB — LIPASE, BLOOD: Lipase: 23 U/L (ref 11–51)

## 2022-11-12 LAB — BETA-HYDROXYBUTYRIC ACID: Beta-Hydroxybutyric Acid: 7.09 mmol/L — ABNORMAL HIGH (ref 0.05–0.27)

## 2022-11-12 MED ORDER — POTASSIUM CHLORIDE 10 MEQ/100ML IV SOLN
10.0000 meq | INTRAVENOUS | Status: AC
  Administered 2022-11-12 (×4): 10 meq via INTRAVENOUS
  Filled 2022-11-12 (×4): qty 100

## 2022-11-12 MED ORDER — LACTATED RINGERS IV SOLN: INTRAVENOUS | Status: DC

## 2022-11-12 MED ORDER — IBUPROFEN 400 MG PO TABS: 600.0000 mg | ORAL_TABLET | Freq: Four times a day (QID) | ORAL | Status: DC

## 2022-11-12 MED ORDER — OXYCODONE-ACETAMINOPHEN 5-325 MG PO TABS: 1.0000 | ORAL_TABLET | ORAL | Status: DC | PRN

## 2022-11-12 MED ORDER — SERTRALINE HCL 50 MG PO TABS
50.0000 mg | ORAL_TABLET | Freq: Every day | ORAL | Status: DC
  Administered 2022-11-12: 50 mg via ORAL
  Filled 2022-11-12: qty 1

## 2022-11-12 MED ORDER — PROCHLORPERAZINE EDISYLATE 10 MG/2ML IJ SOLN
10.0000 mg | Freq: Once | INTRAMUSCULAR | Status: AC
  Administered 2022-11-12: 10 mg via INTRAVENOUS
  Filled 2022-11-12: qty 2

## 2022-11-12 MED ORDER — INSULIN REGULAR(HUMAN) IN NACL 100-0.9 UT/100ML-% IV SOLN
INTRAVENOUS | Status: DC
  Administered 2022-11-12: 6.5 [IU]/h via INTRAVENOUS
  Filled 2022-11-12 (×2): qty 100

## 2022-11-12 MED ORDER — SERTRALINE HCL 100 MG PO TABS
100.0000 mg | ORAL_TABLET | Freq: Every day | ORAL | Status: DC
  Administered 2022-11-13 – 2022-11-16 (×4): 100 mg via ORAL
  Filled 2022-11-12 (×5): qty 1

## 2022-11-12 MED ORDER — ENOXAPARIN SODIUM 40 MG/0.4ML IJ SOSY
40.0000 mg | PREFILLED_SYRINGE | INTRAMUSCULAR | Status: DC
  Administered 2022-11-12 – 2022-11-16 (×4): 40 mg via SUBCUTANEOUS
  Filled 2022-11-12 (×5): qty 0.4

## 2022-11-12 MED ORDER — POTASSIUM CHLORIDE 10 MEQ/100ML IV SOLN
10.0000 meq | INTRAVENOUS | Status: AC
  Administered 2022-11-12 (×5): 10 meq via INTRAVENOUS
  Filled 2022-11-12 (×5): qty 100

## 2022-11-12 MED ORDER — PANTOPRAZOLE SODIUM 40 MG IV SOLR
40.0000 mg | Freq: Once | INTRAVENOUS | Status: AC
  Administered 2022-11-12: 40 mg via INTRAVENOUS
  Filled 2022-11-12: qty 10

## 2022-11-12 MED ORDER — POTASSIUM CHLORIDE 10 MEQ/100ML IV SOLN
10.0000 meq | INTRAVENOUS | Status: AC
  Administered 2022-11-12 (×2): 10 meq via INTRAVENOUS
  Filled 2022-11-12 (×2): qty 100

## 2022-11-12 MED ORDER — DEXTROSE 50 % IV SOLN
0.0000 mL | INTRAVENOUS | Status: DC | PRN
  Administered 2022-11-14: 20 mL via INTRAVENOUS
  Administered 2022-11-15: 50 mL via INTRAVENOUS
  Filled 2022-11-12 (×2): qty 50

## 2022-11-12 MED ORDER — LACTATED RINGERS IV BOLUS
20.0000 mL/kg | Freq: Once | INTRAVENOUS | Status: AC
  Administered 2022-11-12: 1062 mL via INTRAVENOUS

## 2022-11-12 MED ORDER — DEXTROSE IN LACTATED RINGERS 5 % IV SOLN: INTRAVENOUS | Status: DC

## 2022-11-12 MED ORDER — ONDANSETRON HCL 4 MG/2ML IJ SOLN
4.0000 mg | Freq: Three times a day (TID) | INTRAMUSCULAR | Status: DC | PRN
  Administered 2022-11-12 – 2022-11-14 (×3): 4 mg via INTRAVENOUS
  Filled 2022-11-12 (×3): qty 2

## 2022-11-12 NOTE — Progress Notes (Signed)
Went to check on patient.  She was resting comfortably in bed and seemed very calm, was face timing with her father.    I asked patient what she thought prompted this episode of DKA.  Patient states she thinks it was a combination of her stress level, not taking enough insulin, and that she drank 2 mixed drinks of tequila on Wednesday and then became sick on Thursday.  She states she was taking her NovoLog as she was directed to but that she was told to hold off on long-acting until she followed up with her endocrinologist who she has an appointment with on 11/17/2022.  Currently, her baby is in the NICU, she states her baby is progressively doing better and expects the infant home later this week.  States she does have family who she lives with that we will be able to help care for her and the baby when they are home from the hospital.  She states her baby being in the NICU along with being postpartum has made her depression and anxiety worse and she would like to increase her Zoloft if possible.  She denies any thoughts of hurting herself in any way.  I have increased her Zoloft to 100 mg starting tomorrow per her request and feel that it is appropriate at this time.  In terms of her DKA, AG was at 9 at 4 PM.  Will wait for 1 more anion gap WNL before transitioning off of Endo tool and onto home insulin.

## 2022-11-12 NOTE — Assessment & Plan Note (Addendum)
Patient presents w/ hyperglycemia, CBG > 500 on admission, w/ Beta-hydroxybutyrate 7.09, pH 7.449, AG 26. Patient reports poor compliance w/ checking sugars at home since her delivery last month. Patient nearly in DKA, but w/o the acidosis. Will treat hyperglycemia w/ insulin gtt, until AG closed x 2.  -Admit to FPTS, attending Dr. Lum Babe -Endotool for insulin drip -BMP q4h -Neuro checks q4H -Vitals per unit -A1c

## 2022-11-12 NOTE — Assessment & Plan Note (Signed)
Patient presents w/ AKI, Cr on admission 1.41, w/ baseline ~ 0.9. Patient report's recent emesis, likely 2/2 hyperglycemia. AKI likely prerenal in setting of volume/GI losses's. Will CTM, patient on IVF at this time.  -Avoid nephrotoxic medications -CTM -AM BMP

## 2022-11-12 NOTE — Assessment & Plan Note (Addendum)
Patient delivered her baby about a month ago. Her baby was born prematurely and is now in the NICU. This has been very stressful for her. She has had worsening of her baseline depression postpartum.  - TOC resources  - Continue higher zoloft dose 100 mg  - Off unit privileges to visit baby in NICU  - Atarax 25 mg TID prn while in the hospital

## 2022-11-12 NOTE — H&P (Addendum)
Hospital Admission History and Physical Service Pager: [phone]  Patient name: Cayley Pester Medical record number: [id number] Date of Birth: [birth year] Age: 27 y.o. Gender: female  Primary Care Provider: Fayette Pho, MD Consultants: None Code Status: Full Code   Preferred Emergency Contact:  Contact Information     Name Relation Home Work Mobile   Sowder,Letreile Mother [phone]  [phone]       Chief Complaint: Hyperglycemia  Assessment and Plan: Gissele Narducci is a 27 y.o. female presenting with hyperglycemia. Differential for this patient's presentation of this includes med non-compliance (patient report's poorly managing/checking sugars for last month), acute illness (patient reported abdominal pain prior to emesis).    * Uncontrolled type 1 diabetes mellitus with hyperglycemia Patient presents w/ hyperglycemia, CBG > 500 on admission, w/ Beta-hydroxybutyrate 7.09, pH 7.449, AG 26. Patient reports poor compliance w/ checking sugars at home since her delivery last month. Patient nearly in DKA, but w/o the acidosis. Will treat hyperglycemia w/ insulin gtt, until AG closed x 2.  -Admit to FPTS, attending Dr. Lum Babe -Endotool for insulin drip -BMP q4h -Neuro checks q4H -Vitals per unit -A1c  AKI (acute kidney injury) Patient presents w/ AKI, Cr on admission 1.41, w/ baseline ~ 0.9. Patient report's recent emesis, likely 2/2 hyperglycemia. AKI likely prerenal in setting of volume/GI losses's. Will CTM, patient on IVF at this time.  -Avoid nephrotoxic medications -CTM -AM BMP  Type 1 diabetes mellitus with complications Patient is type 1 diabetic, who reports poor DM control since getting of her insulin pump, started during her pregnancy. Prior to pregnancy patient took 20 units semglee, and 10 units novolog w/ meals. Patient report's poor DM management since coming off her insulin pump. -A1c -Consider SSI w/ Semglee once d/c endotool  Status post  primary low transverse cesarean section Patient delivered baby nearly 1 month ago, and continues to take pain meds for recovery from C-section.  -Oxycodone-tylenol 5 - 325 q4h prn   Chronic Controlled Conditions Anxiety - zoloft 50 mg   FEN/GI: NPO  VTE Prophylaxis: Lovenox  Disposition: Progressive  History of Present Illness:  Joel Mericle is a 27 y.o. female presenting with hyperglycemia. Patient started throwing up Thursday and then stopped, and it picked back up Friday morning and got worse, no blood in vomit. Prior to emesis on Thursday, patient was feeling unwell w/ stomach ache, body aches, no diarrhea, and denies any fevers. Patient note's she took around 25 units of insulin around 1 pm Saturday, and 20 units around 8 pm Saturday, but sugars were reading high when measured. Patient note's that before pregnacy she was taking 20 units LAI, and 10 units SAI w/ meals. During pregnancy patient was started on an insulin pump. She has been off the pump since having her baby, almost a month ago, and was told to hold off use until cleared by endocrinologist. She report's poor monitoring of sugars after stopping the pump.   In the ED, patient CBG > 500, w/ AG 26, Cr 1.41, pH 7.449, tachypneic to ~120s. Patient was started on insulin drip, and FMTS was consulted for admission.   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   Pertinent Past Surgical History: C-Section 2024   Remainder reviewed in history tab.  Pertinent Social History: Tobacco use: Former - 0.25 ppd, stopped 01/2023 Alcohol use: Occasionally before pregnancy, hasn't started back Other Substance use: THC prior  to pregnancy, none since Lives with Baby's Father Shella Spearing)  Pertinent Family History: Mother - DM, HLD, Seizures, kidney stones Father - Gout MGMA - DM, Heart disease, HTN MGFA -  CVA Remainder reviewed in history tab.   Important Outpatient Medications: Zoloft 50 mg Oxycodone-tylenol 5-325 Ibuprofen 600 Remainder reviewed in medication history.   Objective: BP 94/65   Pulse (!) 106   Temp 98.2 F (36.8 C)   Resp 12   Ht  (1.702 m)   Wt 53.1 kg   SpO2 100%   BMI 18.32 kg/m  Exam: General: Well appearing, NAD, polite, conversant Cardiovascular: Tachycardic, S1/S2, NRMG Respiratory: CTABL Gastrointestinal: Soft, NTTP, non-distended MSK: Moving all extremities independently, no pitting edema Neuro: At baseline  Labs:  CBC BMET  Recent Labs  Lab 11/11/22 2350 11/11/22 2353  WBC 18.0*  --   HGB 13.2 15.0  HCT 38.6 44.0  PLT 482*  --    Recent Labs  Lab 11/12/22 0436  NA 134*  K 3.4*  CL 96*  CO2 21*  BUN 12  CREATININE 1.20*  GLUCOSE 237*  CALCIUM 9.2      Bess Kinds, MD 11/12/2022, 6:51 AM PGY-2, [facility]  FPTS Intern pager: [phone], text pages welcome Secure chat group [facility] Teaching Service

## 2022-11-12 NOTE — Assessment & Plan Note (Signed)
Patient is type 1 diabetic, who reports poor DM control since getting of her insulin pump, started during her pregnancy. Prior to pregnancy patient took 20 units semglee, and 10 units novolog w/ meals. Patient report's poor DM management since coming off her insulin pump. -A1c -Consider SSI w/ Semglee once d/c endotool

## 2022-11-13 ENCOUNTER — Other Ambulatory Visit (HOSPITAL_COMMUNITY): Payer: Self-pay

## 2022-11-13 DIAGNOSIS — E1065 Type 1 diabetes mellitus with hyperglycemia: Secondary | ICD-10-CM | POA: Diagnosis not present

## 2022-11-13 DIAGNOSIS — Z833 Family history of diabetes mellitus: Secondary | ICD-10-CM | POA: Diagnosis not present

## 2022-11-13 DIAGNOSIS — O99345 Other mental disorders complicating the puerperium: Secondary | ICD-10-CM | POA: Diagnosis not present

## 2022-11-13 DIAGNOSIS — Z79899 Other long term (current) drug therapy: Secondary | ICD-10-CM | POA: Diagnosis not present

## 2022-11-13 DIAGNOSIS — Z794 Long term (current) use of insulin: Secondary | ICD-10-CM | POA: Diagnosis not present

## 2022-11-13 DIAGNOSIS — Z823 Family history of stroke: Secondary | ICD-10-CM | POA: Diagnosis not present

## 2022-11-13 DIAGNOSIS — N179 Acute kidney failure, unspecified: Secondary | ICD-10-CM | POA: Diagnosis not present

## 2022-11-13 DIAGNOSIS — Z8349 Family history of other endocrine, nutritional and metabolic diseases: Secondary | ICD-10-CM | POA: Diagnosis not present

## 2022-11-13 DIAGNOSIS — O2403 Pre-existing diabetes mellitus, type 1, in the puerperium: Secondary | ICD-10-CM | POA: Diagnosis not present

## 2022-11-13 DIAGNOSIS — E1010 Type 1 diabetes mellitus with ketoacidosis without coma: Secondary | ICD-10-CM | POA: Diagnosis not present

## 2022-11-13 DIAGNOSIS — F419 Anxiety disorder, unspecified: Secondary | ICD-10-CM | POA: Diagnosis not present

## 2022-11-13 DIAGNOSIS — F53 Postpartum depression: Secondary | ICD-10-CM | POA: Diagnosis not present

## 2022-11-13 DIAGNOSIS — E10649 Type 1 diabetes mellitus with hypoglycemia without coma: Secondary | ICD-10-CM | POA: Diagnosis not present

## 2022-11-13 DIAGNOSIS — Z91199 Patient's noncompliance with other medical treatment and regimen due to unspecified reason: Secondary | ICD-10-CM | POA: Diagnosis not present

## 2022-11-13 DIAGNOSIS — R739 Hyperglycemia, unspecified: Secondary | ICD-10-CM | POA: Diagnosis not present

## 2022-11-13 DIAGNOSIS — E108 Type 1 diabetes mellitus with unspecified complications: Secondary | ICD-10-CM | POA: Diagnosis not present

## 2022-11-13 DIAGNOSIS — Z87891 Personal history of nicotine dependence: Secondary | ICD-10-CM | POA: Diagnosis not present

## 2022-11-13 DIAGNOSIS — Z8249 Family history of ischemic heart disease and other diseases of the circulatory system: Secondary | ICD-10-CM | POA: Diagnosis not present

## 2022-11-13 DIAGNOSIS — E876 Hypokalemia: Secondary | ICD-10-CM | POA: Diagnosis not present

## 2022-11-13 DIAGNOSIS — R112 Nausea with vomiting, unspecified: Secondary | ICD-10-CM | POA: Diagnosis not present

## 2022-11-13 DIAGNOSIS — Z820 Family history of epilepsy and other diseases of the nervous system: Secondary | ICD-10-CM | POA: Diagnosis not present

## 2022-11-13 DIAGNOSIS — Z9641 Presence of insulin pump (external) (internal): Secondary | ICD-10-CM | POA: Diagnosis not present

## 2022-11-13 LAB — BASIC METABOLIC PANEL
Anion gap: 12 (ref 5–15)
Anion gap: 11 (ref 5–15)
Anion gap: 19 — ABNORMAL HIGH (ref 5–15)
BUN: 6 mg/dL (ref 6–20)
BUN: 5 mg/dL — ABNORMAL LOW (ref 6–20)
BUN: 6 mg/dL (ref 6–20)
CO2: 24 mmol/L (ref 22–32)
CO2: 24 mmol/L (ref 22–32)
CO2: 18 mmol/L — ABNORMAL LOW (ref 22–32)
Calcium: 8.9 mg/dL (ref 8.9–10.3)
Calcium: 8.7 mg/dL — ABNORMAL LOW (ref 8.9–10.3)
Calcium: 9.4 mg/dL (ref 8.9–10.3)
Chloride: 101 mmol/L (ref 98–111)
Chloride: 99 mmol/L (ref 98–111)
Chloride: 96 mmol/L — ABNORMAL LOW (ref 98–111)
Creatinine, Ser: 0.74 mg/dL (ref 0.44–1.00)
Creatinine, Ser: 0.8 mg/dL (ref 0.44–1.00)
Creatinine, Ser: 0.81 mg/dL (ref 0.44–1.00)
GFR, Estimated: >60 mL/min (ref >60)
GFR, Estimated: >60 mL/min (ref >60)
GFR, Estimated: >60 mL/min (ref >60)
Glucose, Bld: 128 mg/dL — ABNORMAL HIGH (ref 70–99)
Glucose, Bld: 265 mg/dL — ABNORMAL HIGH (ref 70–99)
Glucose, Bld: 207 mg/dL — ABNORMAL HIGH (ref 70–99)
Potassium: 3.1 mmol/L — ABNORMAL LOW (ref 3.5–5.1)
Potassium: 3.6 mmol/L (ref 3.5–5.1)
Potassium: 3.5 mmol/L (ref 3.5–5.1)
Sodium: 137 mmol/L (ref 135–145)
Sodium: 134 mmol/L — ABNORMAL LOW (ref 135–145)
Sodium: 133 mmol/L — ABNORMAL LOW (ref 135–145)

## 2022-11-13 LAB — GLUCOSE, CAPILLARY
Glucose-Capillary: 298 mg/dL — ABNORMAL HIGH (ref 70–99)
Glucose-Capillary: 223 mg/dL — ABNORMAL HIGH (ref 70–99)

## 2022-11-13 LAB — CBC
HCT: 34.4 % — ABNORMAL LOW (ref 36.0–46.0)
Hemoglobin: 12 g/dL (ref 12.0–15.0)
MCH: 29.4 pg (ref 26.0–34.0)
MCHC: 34.9 g/dL (ref 30.0–36.0)
MCV: 84.3 fL (ref 80.0–100.0)
Platelets: 341 10*3/uL (ref 150–400)
RBC: 4.08 MIL/uL (ref 3.87–5.11)
RDW: 13.7 % (ref 11.5–15.5)
WBC: 11 10*3/uL — ABNORMAL HIGH (ref 4.0–10.5)
nRBC: 0 % (ref 0.0–0.2)

## 2022-11-13 LAB — CBG MONITORING, ED
Glucose-Capillary: 121 mg/dL — ABNORMAL HIGH (ref 70–99)
Glucose-Capillary: 140 mg/dL — ABNORMAL HIGH (ref 70–99)
Glucose-Capillary: 150 mg/dL — ABNORMAL HIGH (ref 70–99)
Glucose-Capillary: 166 mg/dL — ABNORMAL HIGH (ref 70–99)
Glucose-Capillary: 177 mg/dL — ABNORMAL HIGH (ref 70–99)
Glucose-Capillary: 195 mg/dL — ABNORMAL HIGH (ref 70–99)
Glucose-Capillary: 227 mg/dL — ABNORMAL HIGH (ref 70–99)
Glucose-Capillary: 251 mg/dL — ABNORMAL HIGH (ref 70–99)
Glucose-Capillary: 261 mg/dL — ABNORMAL HIGH (ref 70–99)
Glucose-Capillary: 312 mg/dL — ABNORMAL HIGH (ref 70–99)

## 2022-11-13 LAB — CBC WITH DIFFERENTIAL/PLATELET
Abs Immature Granulocytes: 0.05 10*3/uL (ref 0.00–0.07)
Basophils Absolute: 0 10*3/uL (ref 0.0–0.1)
Basophils Relative: 0 %
Eosinophils Absolute: 0 10*3/uL (ref 0.0–0.5)
Eosinophils Relative: 0 %
HCT: 31.8 % — ABNORMAL LOW (ref 36.0–46.0)
Hemoglobin: 10.9 g/dL — ABNORMAL LOW (ref 12.0–15.0)
Immature Granulocytes: 0 %
Lymphocytes Relative: 27 %
Lymphs Abs: 3.3 10*3/uL (ref 0.7–4.0)
MCH: 28.8 pg (ref 26.0–34.0)
MCHC: 34.3 g/dL (ref 30.0–36.0)
MCV: 84.1 fL (ref 80.0–100.0)
Monocytes Absolute: 0.9 10*3/uL (ref 0.1–1.0)
Monocytes Relative: 8 %
Neutro Abs: 8.1 10*3/uL — ABNORMAL HIGH (ref 1.7–7.7)
Neutrophils Relative %: 65 %
Platelets: 348 10*3/uL (ref 150–400)
RBC: 3.78 MIL/uL — ABNORMAL LOW (ref 3.87–5.11)
RDW: 13.8 % (ref 11.5–15.5)
WBC: 12.5 10*3/uL — ABNORMAL HIGH (ref 4.0–10.5)
nRBC: 0 % (ref 0.0–0.2)

## 2022-11-13 LAB — MRSA NEXT GEN BY PCR, NASAL: MRSA by PCR Next Gen: NOT DETECTED

## 2022-11-13 LAB — HEMOGLOBIN A1C
Hgb A1c MFr Bld: 9.6 % — ABNORMAL HIGH (ref 4.8–5.6)
Mean Plasma Glucose: 228.82 mg/dL

## 2022-11-13 MED ORDER — DEXTROSE-NACL 5-0.45 % IV SOLN: INTRAVENOUS | Status: DC

## 2022-11-13 MED ORDER — INSULIN GLARGINE-YFGN 100 UNIT/ML ~~LOC~~ SOLN
20.0000 [IU] | Freq: Once | SUBCUTANEOUS | Status: AC
  Administered 2022-11-13: 20 [IU] via SUBCUTANEOUS
  Filled 2022-11-13: qty 0.2

## 2022-11-13 MED ORDER — INSULIN ASPART 100 UNIT/ML IJ SOLN
0.0000 [IU] | Freq: Three times a day (TID) | INTRAMUSCULAR | Status: DC
  Administered 2022-11-13: 3 [IU] via SUBCUTANEOUS
  Administered 2022-11-14: 1 [IU] via SUBCUTANEOUS

## 2022-11-13 MED ORDER — POTASSIUM CHLORIDE CRYS ER 20 MEQ PO TBCR
60.0000 meq | EXTENDED_RELEASE_TABLET | Freq: Once | ORAL | Status: AC
  Administered 2022-11-13: 60 meq via ORAL
  Filled 2022-11-13: qty 3

## 2022-11-13 MED ORDER — INSULIN ASPART 100 UNIT/ML IJ SOLN
2.0000 [IU] | Freq: Once | INTRAMUSCULAR | Status: AC
  Administered 2022-11-13: 2 [IU] via SUBCUTANEOUS
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
## 2022-11-13 NOTE — Progress Notes (Signed)
Daily Progress Note Intern Pager: [phone]  Patient name: Sabrina Sutton Medical record number: [id number] Date of birth: 1995/09/25 Age: 27 y.o. Gender: female  Primary Care Provider: Fayette Pho, MD Consultants: None Code Status: Full  Pt Overview and Major Events to Date:  4/21 - Admitted, started on endotool   Assessment and Plan: Sabrina Sutton is a 27 y.o. female w/ PMH of T1DM, recently postpartum who presented with hyperglycemia and metabolic acidosis in the setting of decreasing insulin use. Has now closed anion gap x2, and is transitioning to subcutaneous insulin.   * Uncontrolled type 1 diabetes mellitus with hyperglycemia Patient had misunderstanding and was taking her Novolog but not her long acting insulin after her delivery. She was going to follow up with her endocrinologist on 4/24. Patient has also been much more stressed since her delivery as her newborn is in the NICU. Patient's gap has closed x 2. Will start subcutaneous insulin.  -Start 20 u semglee, keep endotool running for 1-2 hours after administering  -BMP q 12hrs -Regular diet  -Vitals per unit - F/u A1c  -CBG AC, QHS  Type 1 diabetes mellitus with complications Patient is type 1 diabetic, who reports poor DM control since getting of her insulin pump, started during her pregnancy. Prior to pregnancy patient took 20 units semglee, and 10 units novolog w/ meals. Patient report's poor DM management since coming off her insulin pump. -A1c  Status post primary low transverse cesarean section Patient delivered her baby about a month ago. Her baby was born prematurely and is now in the NICU. This has been very stressful for her. She has had worsening of her baseline depression postpartum.  - TOC resources  - Continue higher zoloft dose 100 mg    FEN/GI: Regular diet  PPx: Lovenox  Dispo:Home pending clinical improvement.   Subjective:  Patient has some throat discomfort from frequent  vomiting. Otherwise says she is not nauseous at the moment. No abdominal pain. Patient said she would like to try eating something small.   Objective: Temp:  [98 F (36.7 C)-98.8 F (37.1 C)] 98.5 F (36.9 C) (04/22 1054) Pulse Rate:  [63-97] 63 (04/22 1030) Resp:  [13-18] 16 (04/22 1030) BP: (101-154)/(64-110) 121/90 (04/22 1030) SpO2:  [99 %-100 %] 100 % (04/22 1030)  Physical Exam: General: Tired appearing, in no acute distress Cardiovascular: RRR, No M/R/G, cap refill < 2 seconds  Respiratory: No increased work of breathing on room air  Abdomen: Soft, non tender to palpation, non distended  Extremities: No BLE edema   Laboratory: Most recent CBC Lab Results  Component Value Date   WBC 12.5 (H) 11/13/2022   HGB 10.9 (L) 11/13/2022   HCT 31.8 (L) 11/13/2022   MCV 84.1 11/13/2022   PLT 348 11/13/2022   Most recent BMP    Latest Ref Rng & Units 11/13/2022    9:45 AM  BMP  Glucose 70 - 99 mg/dL 213   BUN 6 - 20 mg/dL 5   Creatinine 0.86 - 5.78 mg/dL 4.69   Sodium 629 - 528 mmol/L 134   Potassium 3.5 - 5.1 mmol/L 3.6   Chloride 98 - 111 mmol/L 99   CO2 22 - 32 mmol/L 24   Calcium 8.9 - 10.3 mg/dL 8.7     Other pertinent labs: Anion gap 12  Lockie Mola, MD 11/13/2022, 11:50 AM  PGY-1, [facility] Family Medicine FPTS Intern pager: [phone], text pages welcome Secure chat group [facility] Teaching  Service

## 2022-11-13 NOTE — Progress Notes (Signed)
Patient taken off the floor to NICU to see daughter. NICU nurse Marylene Land aware of patient being there.

## 2022-11-13 NOTE — Progress Notes (Signed)
Patient returned from NICU

## 2022-11-13 NOTE — Lactation Note (Signed)
This note was copied from a baby's chart. Lactation Consultation Note  Patient Name: Girl Shemeka Wardle [id number] Date: 11/13/2022 Age:25 wk.o.   [facility] messaged RN taking care of Mom in ED today.  Mom is not pumping anymore and baby is formula feeding by bottle.  LC services not needed.  Consult Status Consult Status: Complete Date: 11/13/22    Judee Clara 11/13/2022, 2:06 PM

## 2022-11-13 NOTE — Inpatient Diabetes Management (Addendum)
Inpatient Diabetes Program Recommendations  AACE/ADA: New Consensus Statement on Inpatient Glycemic Control   Target Ranges:  Prepandial:   less than 140 mg/dL      Peak postprandial:   less than 180 mg/dL (1-2 hours)      Critically ill patients:  140 - 180 mg/dL    Latest Reference Range & Units 11/13/22 00:21 11/13/22 01:30 11/13/22 03:00 11/13/22 03:42 11/13/22 04:54 11/13/22 06:34 11/13/22 07:46  Glucose-Capillary 70 - 99 mg/dL 161 (H) 096 (H) 045 (H) 140 (H) 177 (H) 195 (H) 121 (H)    Latest Reference Range & Units 11/12/22 11:03 11/12/22 13:12 11/12/22 14:28 11/12/22 15:52 11/12/22 17:04 11/12/22 18:15 11/12/22 19:34 11/12/22 20:36 11/12/22 22:10  Glucose-Capillary 70 - 99 mg/dL 409 (H) 811 (H) 914 (H) 156 (H) 245 (H) 222 (H) 147 (H) 152 (H) 174 (H)    Latest Reference Range & Units 11/13/22 03:57  CO2 22 - 32 mmol/L 24  Glucose 70 - 99 mg/dL 782 (H)  BUN 6 - 20 mg/dL 6  Creatinine 9.56 - 2.13 mg/dL 0.86  Calcium 8.9 - 57.8 mg/dL 8.9  Anion gap 5 - 15  12    Latest Reference Range & Units 11/11/22 23:54  CO2 22 - 32 mmol/L 16 (L)  Glucose 70 - 99 mg/dL 469 (HH)  BUN 6 - 20 mg/dL 15  Creatinine 6.29 - 5.28 mg/dL 4.13 (H)  Calcium 8.9 - 10.3 mg/dL 9.8  Anion gap 5 - 15  26 (H)    Latest Reference Range & Units 11/11/22 23:50  Beta-Hydroxybutyric Acid 0.05 - 0.27 mmol/L 7.09 (H)   Review of Glycemic Control  Diabetes history: DM1 Outpatient Diabetes medications: Novolog TID with meals, Semglee 12 units daily (not taking) Current orders for Inpatient glycemic control: IV insulin  Inpatient Diabetes Program Recommendations:    Insulin: Once provider is ready to transition from IV to SQ insulin, please consider ordering Semglee 12 units Q24H, Novolog 0-6 units TID with meals, Novolog 0-5 units QHS, and Novolog 4 units TID with meals for meal coverage if patient eats at least 50% of meals.  Outpatient DM: At time of discharge, please provide Rx for Lantus Solostar pens  [phone]) and Novolog Flexpens [phone]).   NOTE: Patient is currently in the ED and admitted with uncontrolled DM1 with hyperglycemia, AKI, post c-section on 10/18/22. Patient's initial glucose 580 mg/dl, CO2 16, AG 26, and Beta-hydroxybutyric acid 7.09 on 11/11/22. Patient was recently inpatient 10/15/22-10/22/22; had been on an OmniPod insulin pump prior to admission and was discharged on Semglee 12 units daily and Novolog 3 units TID with meals. Per note today by Dr. Marsh Dolly, "Patient had misunderstanding and was taking her Novolog but not her long acting insulin after her delivery. She was going to follow up with her endocrinologist on 4/24. Patient has also been much more stressed since her delivery as her newborn is in the NICU."  Per chart, patient sees [facility] for DM management and seen Ronny Bacon, NP on 08/22/22.   Addendum 11/13/22@13 :40-Spoke with patient at bedside regarding DM control. Patient states that since she was discharged from the hospital on 10/22/22 she was only taking Novolog insulin. Patient reports that she was not prescribed any long acting insulin at hospital discharge. Explained that per discharge summary on 10/22/22, she was prescribed Semglee 12 units daily and Novolog 3 units TID with meals for DM control. Discussed that given patient has DM1, she requires basal, correction, and meal coverage insulin. Explained that without basal  insulin, she is at risk of developing DKA. Discussed how Novolog insulin only works for 4-6 hours so if she is only taking it a couple times a day she is going several hours everyday without active insulin. Patient reports that she will take insulin as prescribed at discharge but she notes she will need Rx for basal insulin pens and Novolog insulin pens. She states she has plenty of insulin pen needles and testing supplies. She states that she needs to get a new transmitter for the Dexcom G6 before she can use the Dexcom CGM. Patient states  that she has an appointment with Ronny Bacon, NP on 11/15/22 for DM follow up and she is planning to see if she can get back on the OmniPod insulin pump at that time and she plans to ask for Rx for a new Dexcom G6 transmitter.  Stressed to patient to be sure to pay attention to discharge paperwork, to pick up prescriptions for basal insulin pens and Novolog insulin pens, to be sure to take insulins as prescribed at discharge, and follow up with Endocrinologist on 11/15/22 as already scheduled. Patient notes that her infant baby girl is still in NICU but if she continues to progress that she will likely get to be discharged before the end of the week. She also notes that her baby is being bottle fed (not breast feeding or pumping). Encouraged patient to check glucose 3-4 times per day and to get DM under control.  Patient verbalized understanding of information discussed and she states she has no questions at this time. Had Roland Earl, CPhT with [facility] outpatient pharmacy check about insulins covered; Shelly Coss is not covered with insurance; Lantus and Novolog pens are both covered and $4 copay.   Thanks, Orlando Penner, RN, MSN, CDCES Diabetes Coordinator Inpatient Diabetes Program [phone] (Team Pager from 8am to 5pm)

## 2022-11-13 NOTE — Progress Notes (Signed)
Patient transported to NICU by this RN.  NICU staff aware.

## 2022-11-14 ENCOUNTER — Other Ambulatory Visit: Payer: Self-pay | Admitting: Obstetrics and Gynecology

## 2022-11-14 DIAGNOSIS — E108 Type 1 diabetes mellitus with unspecified complications: Secondary | ICD-10-CM

## 2022-11-14 LAB — BASIC METABOLIC PANEL
Anion gap: 13 (ref 5–15)
Anion gap: 12 (ref 5–15)
Anion gap: 10 (ref 5–15)
BUN: 5 mg/dL — ABNORMAL LOW (ref 6–20)
BUN: 5 mg/dL — ABNORMAL LOW (ref 6–20)
BUN: <5 mg/dL — ABNORMAL LOW (ref 6–20)
CO2: 20 mmol/L — ABNORMAL LOW (ref 22–32)
CO2: 23 mmol/L (ref 22–32)
CO2: 23 mmol/L (ref 22–32)
Calcium: 8.6 mg/dL — ABNORMAL LOW (ref 8.9–10.3)
Calcium: 8.3 mg/dL — ABNORMAL LOW (ref 8.9–10.3)
Calcium: 8.7 mg/dL — ABNORMAL LOW (ref 8.9–10.3)
Chloride: 99 mmol/L (ref 98–111)
Chloride: 95 mmol/L — ABNORMAL LOW (ref 98–111)
Chloride: 101 mmol/L (ref 98–111)
Creatinine, Ser: 0.8 mg/dL (ref 0.44–1.00)
Creatinine, Ser: 0.81 mg/dL (ref 0.44–1.00)
Creatinine, Ser: 0.65 mg/dL (ref 0.44–1.00)
GFR, Estimated: >60 mL/min (ref >60)
GFR, Estimated: >60 mL/min (ref >60)
GFR, Estimated: >60 mL/min (ref >60)
Glucose, Bld: 284 mg/dL — ABNORMAL HIGH (ref 70–99)
Glucose, Bld: 289 mg/dL — ABNORMAL HIGH (ref 70–99)
Glucose, Bld: 127 mg/dL — ABNORMAL HIGH (ref 70–99)
Potassium: 3.5 mmol/L (ref 3.5–5.1)
Potassium: 3.4 mmol/L — ABNORMAL LOW (ref 3.5–5.1)
Potassium: 3.8 mmol/L (ref 3.5–5.1)
Sodium: 132 mmol/L — ABNORMAL LOW (ref 135–145)
Sodium: 130 mmol/L — ABNORMAL LOW (ref 135–145)
Sodium: 134 mmol/L — ABNORMAL LOW (ref 135–145)

## 2022-11-14 LAB — GLUCOSE, CAPILLARY
Glucose-Capillary: 122 mg/dL — ABNORMAL HIGH (ref 70–99)
Glucose-Capillary: 130 mg/dL — ABNORMAL HIGH (ref 70–99)
Glucose-Capillary: 156 mg/dL — ABNORMAL HIGH (ref 70–99)
Glucose-Capillary: 257 mg/dL — ABNORMAL HIGH (ref 70–99)
Glucose-Capillary: 61 mg/dL — ABNORMAL LOW (ref 70–99)
Glucose-Capillary: 63 mg/dL — ABNORMAL LOW (ref 70–99)

## 2022-11-14 LAB — BETA-HYDROXYBUTYRIC ACID: Beta-Hydroxybutyric Acid: 3.34 mmol/L — ABNORMAL HIGH (ref 0.05–0.27)

## 2022-11-14 MED ORDER — ONDANSETRON 4 MG PO TBDP
4.0000 mg | ORAL_TABLET | Freq: Three times a day (TID) | ORAL | Status: DC | PRN
  Administered 2022-11-14 – 2022-11-15 (×2): 4 mg via ORAL
  Filled 2022-11-14 (×2): qty 1

## 2022-11-14 MED ORDER — LACTATED RINGERS IV SOLN: INTRAVENOUS | Status: DC

## 2022-11-14 MED ORDER — INSULIN ASPART 100 UNIT/ML IJ SOLN
0.0000 [IU] | Freq: Three times a day (TID) | INTRAMUSCULAR | Status: DC
  Administered 2022-11-14: 3 [IU] via SUBCUTANEOUS
  Administered 2022-11-15: 4 [IU] via SUBCUTANEOUS
  Administered 2022-11-16: 2 [IU] via SUBCUTANEOUS

## 2022-11-14 MED ORDER — POTASSIUM CHLORIDE 10 MEQ/100ML IV SOLN
10.0000 meq | INTRAVENOUS | Status: AC
  Administered 2022-11-14 (×4): 10 meq via INTRAVENOUS
  Filled 2022-11-14 (×4): qty 100

## 2022-11-14 MED ORDER — INSULIN GLARGINE-YFGN 100 UNIT/ML ~~LOC~~ SOLN
20.0000 [IU] | Freq: Once | SUBCUTANEOUS | Status: AC
  Administered 2022-11-14: 20 [IU] via SUBCUTANEOUS
  Filled 2022-11-14: qty 0.2

## 2022-11-14 MED ORDER — PROCHLORPERAZINE EDISYLATE 10 MG/2ML IJ SOLN
10.0000 mg | Freq: Once | INTRAMUSCULAR | Status: AC
  Administered 2022-11-14: 10 mg via INTRAVENOUS
  Filled 2022-11-14: qty 2

## 2022-11-14 MED ORDER — LACTATED RINGERS IV BOLUS
1000.0000 mL | Freq: Once | INTRAVENOUS | Status: AC
  Administered 2022-11-14: 1000 mL via INTRAVENOUS

## 2022-11-14 MED ORDER — HYDROXYZINE HCL 25 MG PO TABS
25.0000 mg | ORAL_TABLET | Freq: Three times a day (TID) | ORAL | Status: DC | PRN
  Administered 2022-11-14: 25 mg via ORAL
  Filled 2022-11-14: qty 1

## 2022-11-14 MED ORDER — INSULIN ASPART 100 UNIT/ML IJ SOLN: 5.0000 [IU] | Freq: Three times a day (TID) | INTRAMUSCULAR | Status: DC

## 2022-11-14 MED ORDER — INSULIN GLARGINE-YFGN 100 UNIT/ML ~~LOC~~ SOLN
15.0000 [IU] | Freq: Every day | SUBCUTANEOUS | Status: DC
  Filled 2022-11-14: qty 0.15

## 2022-11-14 MED ORDER — INSULIN GLARGINE-YFGN 100 UNIT/ML ~~LOC~~ SOLN
20.0000 [IU] | Freq: Every day | SUBCUTANEOUS | Status: DC
  Filled 2022-11-14: qty 0.2

## 2022-11-14 NOTE — Discharge Instructions (Addendum)
Dear Sabrina Sutton,   Thank you so much for allowing [place] to be part of your care!  You were admitted to [facility] for DKA. This occurs when you do not have enough insulin in your body to deal with the glucose that you do have. We treated you with insulin and fluids. You will be discharged on 5 units long acting insulin as well as short acting novolog that you can give using the sliding scale that your endocrinologist provided.    POST-HOSPITAL & CARE INSTRUCTIONS Please follow up with your endocrinologist to get started on an insulin pump again  Please get in touch with some mental health resources to support you in this time.  Please let PCP/Specialists know of any changes that were made.  Please see medications section of this packet for any medication changes.   DOCTOR'S APPOINTMENT & FOLLOW UP CARE INSTRUCTIONS  Future Appointments  Date Time Provider Department Center  11/21/2022  3:50 PM Fayette Pho, MD [facility]  12/11/2022 11:30 AM Dani Gobble, NP [facility] None    RETURN PRECAUTIONS: Please seek medical care if you have symptoms like vomiting, nausea, lightheadedness, or very high sugars.    Take care and be well!  Family Medicine Teaching Service  [place]  [facility]  [address] [place], [place] [address] [phone]    [facility] Mental Health Resources What if I or someone I know is in crisis? If you are thinking about harming yourself or having thoughts of suicide, or if you know someone who is, seek help right away. Call your doctor or mental health care provider. Call 911 or go to a hospital emergency room to get immediate help, or ask a friend or family member to help you do these things; IF YOU ARE IN [place], YOU MAY GO TO WALK-IN URGENT CARE 24/7 at [facility] (see below) Call the [place] National Suicide Prevention Lifeline's toll-free, 24-hour hotline at 1-800-273-TALK  269-642-7003) or TTY: 1-800-799-4 TTY ((573)277-3965) to talk to a trained counselor. If you are in crisis, make sure you are not left alone.  If someone else is in crisis, make sure he or she is not left alone 24 Hour Mental Health Services:  [facility]  [address], [place], [place] [address] [phone] or [phone] WALK-IN URGENT CARE 24/7 [facility] Mobile Crisis: [phone] [place] National Suicide Hotline: 516-606-1465 Family Service of the [employer] (Domestic Violence, Rape & Victim Assistance)  (816)039-6023 [facility] [employer] Crisis Services: [phone] (8am-4pm) or [phone] (after hours)      Local Mental Health Agencies: [facility], [address], [place], [place]  [phone] Fax: [phone] [email or web address] *Interpreters available *Accepts all insurance and uninsured for Urgent Care needs *Accepts Medicaid and uninsured for outpatient treatment  [facility]   Mon-Fri: 8am-5pm [address], [place], [place] [phone](BOFBP); [phone](fax) [email or web address]  *Accepts Medicare [facility] [place], Fri: 8am-4pm [address], [place], [place] [address] [phone] (phone); [phone] (fax) ExShows.dk  *Accepts Medicare [facility] Mon-Fri: 9am-5pm  [address], [place], [place] [phone] (phone); [phone]  MommyCollege.dk  *Accepts Medicaid Family Services of the [place], 8:30am-12pm/1pm-2:30pm [address], [place], [place] [phone] (phone); [phone] (fax) www.fspcares.org  *Accepts Medicaid, sliding-scale*Bilingual services available [facility] Mon-Fri, 8am-7pm [address] [place], [place]  [phone](MHDQQ);  ([phone](fax) www.[email or web address]  *Accepts Medicaid *Bilingual services  available [facility] Mon-Fri: 8am-5pm, Saturday by appointment only [address] [place], [place], [place] [phone] (phone); [phone] (fax) www.[email or web address]  [facility] [address], [place], [place] [phone] [email or web address]  *Free & reduced services for uninsured and underinsured individuals *Bilingual services for Spanish-speaking clients 21 and under [employer] [facility] Mon-Fri 8am-5pm [facility], [address], [place], [place] [address] [phone] (phone) KittenExchange.[email or web address]  *Bring your own interpreter at first visit *Accepts Medicare and [facility] Mon-Fri: 9am-5:30pm [address], [place], [place] [phone] (phone), [phone] (fax) After hours crisis line: [phone] www.[email or web address]  *Accepts Medicare and Medicaid [employer], 8am-6pm [address] [place], [place]  [phone] (phone); [phone] (fax) http://[email or web address]  *Subsidized costs available [facility] Mon-Fri: 8am-4pm [address], [place], [place] [phone](UKGUR); [phone](fax) www.psychotherapeuticservices.com  *Accepts Medicaid [facility] Same day access hours: Mon-Fri, 8:30-3pm Crisis hours: Mon-Fri, 8am-5pm [address], [place], [place] [phone] [facility] [employer] Same day access hours: Mon-Fri, 8:30-3pm Crisis hours: Mon-Fri, 8am-8pm [address], [place], [place] [phone] (phone); [phone] (fax) www.rhahealthservices.org  *Accepts Medicaid and Medicare [facility] [place], [place], Fri: 9am-9pm Tues, Thurs: 9am-6pm [address] [place], [place], [place]  [phone] (phone); [phone] (fax) https://[email or web address]   *(Accepts Medicare and Medicaid; payment plans available)*Bilingual services available [facility] [address] [place], [place] [phone] (phone); [phone] (fax) www.[email or web address]  [facility] [address], [place], [place]  [phone]  RackRewards.fr  *Bilingual services available [facility]otional [facility]) Mon-Thurs: 8am-8pm [address], [place], [place] [phone] (phone); [phone] (fax) ScrapbookLive.si  *Accepts Medicaid*Bilingual services available [facility] [address] [place], [place] [phone] (phone) BrotherBig.at  *Accepts Medicaid *Bilingual services available [facility] Mon-Fri, 9am-4:45pm [address] [place], [place] [phone] (phone); [phone] (fax) http://[email or web address]  *Accepts Medicare [facility] Mon-Thurs: 8:30-8pm, Fri: 8:30am-7pm [address], [place], [place] (3rd floor) [phone] (phone); [phone] (fax) [email or web address].[email or web address]  *Accepts Medicaid; income-based reduced rates available [facility] Mon-Fri: 8am-5pm [address], [place], [place] [address] [phone] (phone); [phone] (fax) http://[email or web address]  *Accepts Medicaid*Bilingual services available [facility] [employer] [place])  [address] [place] [phone] www.[email or web address]  *Provides direct services to individuals in recovery from mental illness, including support groups, recovery skills classes, and one on one peer support [employer] on Mental Illness) [place] helpline: (361)667-6489  NAMI [place] helpline: (848)168-1824 https://namiguilford.org  *A community hub for information relating to local resources and services for the friends and families of individuals living alongside  a mental health condition, as well as the individuals themselves. Classes and support groups also provided

## 2022-11-14 NOTE — Inpatient Diabetes Management (Addendum)
Inpatient Diabetes Program Recommendations  AACE/ADA: New Consensus Statement on Inpatient Glycemic Control (2015)  Target Ranges:  Prepandial:   less than 140 mg/dL      Peak postprandial:   less than 180 mg/dL (1-2 hours)      Critically ill patients:  140 - 180 mg/dL   Lab Results  Component Value Date   GLUCAP 156 (H) 11/14/2022   HGBA1C 9.6 (H) 11/13/2022    Review of Glycemic Control  Latest Reference Range & Units 11/13/22 16:58 11/13/22 21:16 11/14/22 06:08  Glucose-Capillary 70 - 99 mg/dL 829 (H) 562 (H) 130 (H)  (H): Data is abnormally high Diabetes history: DM1 Outpatient Diabetes medications: Novolog TID with meals, Semglee 12 units daily (not taking) Current orders for Inpatient glycemic control: Semglee 20 units x 1, Novolog 0-6 units TID   Inpatient Diabetes Program Recommendations:     Consider adding Novolog 4 units TID with meals for meal coverage if patient eats at least 50% of meals.   Outpatient DM: At time of discharge, please provide Rx for Lantus Solostar pens [phone]) and Novolog Flexpens [phone]).   Addendum: Spoke with patient again to reinforce concepts discussed on 4/22. Stressed importance of picking up medications to include basal insulin and short acting insulin. Patient is scheduled for endocrinology in aM. Reviewed questions to discuss at this appointment to include restarting insulin pump. Patient is aware and has no further questions at this time. Feels it will be easier to manage following infant dc.   Thanks, Lujean Rave, MSN, RNC-OB Diabetes Coordinator [phone] (8a-5p)

## 2022-11-14 NOTE — Progress Notes (Signed)
Patient returned to the unit from NICU.

## 2022-11-14 NOTE — Progress Notes (Signed)
Daily Progress Note Intern Pager: [phone]  Patient name: Sabrina Sutton Medical record number: [id number] Date of birth: 1995-09-03 Age: 27 y.o. Gender: female  Primary Care Provider: Fayette Pho, MD Consultants: None Code Status: Full  Pt Overview and Major Events to Date:  4/21 - Admitted, started on endotool    Assessment and Plan: Sabrina Sutton is a 27 y.o. female w/ PMH of T1DM, recently postpartum who presented with hyperglycemia and metabolic acidosis in the setting of decreasing insulin use. Is now on subcutaneous insulin; however, has had elevated gap once again.   * Uncontrolled type 1 diabetes mellitus with hyperglycemia Patient had elevated gap to 19 and then 13 this morning. She has been on semglee 20 u since yesterday. S/p 1L bolus and started maintenance IVF.  -Continue Semglee 20 u, start 5 u mealtime coverage, very sensitive SSI  -F/u 1200 BMP  -BMP q 12 hrs  -Regular diet  -Vitals per unit - F/u A1c  -CBG AC, QHS -Zofran 4 mg po q8hrs prn  -Patient has scheduled appointment with endocrinology tomorrow  -used to have G6 CGM but needed new prescription for transmitter; hopefully, can get set up with CGM at endocrinology appointment.   Type 1 diabetes mellitus with complications Patient is type 1 diabetic, who reports poor DM control since getting of her insulin pump, started during her pregnancy. Prior to pregnancy patient took 20 units semglee, and 10 units novolog w/ meals. Patient report's poor DM management since coming off her insulin pump. -A1c  Status post primary low transverse cesarean section Patient delivered her baby about a month ago. Her baby was born prematurely and is now in the NICU. This has been very stressful for her. She has had worsening of her baseline depression postpartum.  - TOC resources  - Continue higher zoloft dose 100 mg  - Off unit privileges to visit baby in NICU     FEN/GI: Regular  PPx: Lovenox  Dispo:  Home pending clinical improvement  Subjective:  Patient says she is still slightly nauseous as she is now just starting to eat more, but is able to eat more than yesterday. She misses her baby, but has been able to visit her in the NICU.   Objective: Temp:  [97.9 F (36.6 C)-98.9 F (37.2 C)] 98 F (36.7 C) (04/23 0852) Pulse Rate:  [63-78] 63 (04/23 0852) Resp:  [12-18] 12 (04/23 0852) BP: (118-134)/(78-97) 134/97 (04/23 0852) SpO2:  [98 %-100 %] 99 % (04/23 0852) Weight:  [53.1 kg] 53.1 kg (04/22 1600) Physical Exam: General: Well appearing, sitting in bed  Cardiovascular: RRR, cap refill = 2 seconds  HEENT: slightly dry mucous membranes  Respiratory: Normal work of breathing on room air, CTAB Abdomen: Soft, non tender, non distended Extremities: No BLE edema   Laboratory: Most recent CBC Lab Results  Component Value Date   WBC 11.0 (H) 11/13/2022   HGB 12.0 11/13/2022   HCT 34.4 (L) 11/13/2022   MCV 84.3 11/13/2022   PLT 341 11/13/2022   Most recent BMP    Latest Ref Rng & Units 11/14/2022    7:28 AM  BMP  Glucose 70 - 99 mg/dL 244   BUN 6 - 20 mg/dL 5   Creatinine 0.10 - 2.72 mg/dL 5.36   Sodium 644 - 034 mmol/L 132   Potassium 3.5 - 5.1 mmol/L 3.5   Chloride 98 - 111 mmol/L 99   CO2 22 - 32 mmol/L 20   Calcium 8.9 - 10.3  mg/dL 8.6   BHB 3.34 AG 13  Lockie Mola, MD 11/14/2022, 10:41 AM  PGY-1, [facility] Family Medicine FPTS Intern pager: [phone], text pages welcome Secure chat group [facility] Teaching Service

## 2022-11-14 NOTE — Hospital Course (Addendum)
Sabrina Sutton is a 27 y.o.female with a history of T1DM, depression, postpartum who was admitted to the [facility] Teaching Service at [facility] for DKA. Her hospital course is detailed below:  DKA Patient presented with nausea and vomiting with glucose in 500s with presence of ketones, and anion gap. Patient had misunderstanding with her providers and had only been taking her novolog. Additionally she had been in a lot of stress as she recently had her baby who is in the NICU. She was started on Endotool. She was able to come off of Endotool and start subcutaneous insulin on 4/22. However, patient continued to have vomiting resulting in some hypoglycemia episodes overnight requiring D50. Her long acting insulin was significantly reduced and she was maintained on D5LR. D5LR was discontinued on 4/26 and patient was discharged on 5 units long acting with sliding scale short acting insulin provided by endocrinologist.   Depression I Recently Post Partum Patient has history of depression on zoloft 50 mg. Her depression had worsened after being postpartum. She was increased to zoloft 100 mg. TOC offered resources for postpartum depression.    PCP Follow-up Recommendations: Mood - Increased zoloft to 100 mg, Encourage counseling/therapy, or mom support group  Has endocrinology appointment on May 20th. Will start omnipod insulin pump at that time

## 2022-11-14 NOTE — Social Work (Signed)
CSW acknowledges consult for Postpartum depression counseling and resources. CSW spoke with pt and pt states she is "hanging in there". CSW validated pt's feelings about the stress that comes with a baby in the NICU. Pt states baby is doing well and should be able to DC home this week. Pt confirms a safe place to live and a good support system. Pt agreeable to resources, CSW encouraged pt to seek treatment if necessary. Resources added to AVS for pt's convenience.  Pt denies Suicidal ideation, self harm or thoughts of self harm. Pt denies HI or thoughts of hurting the baby. TOC is available for any further needs.

## 2022-11-15 ENCOUNTER — Ambulatory Visit: Payer: Medicaid Other | Admitting: Family Medicine

## 2022-11-15 ENCOUNTER — Telehealth: Payer: Self-pay | Admitting: *Deleted

## 2022-11-15 ENCOUNTER — Ambulatory Visit: Payer: Medicaid Other | Admitting: Nurse Practitioner

## 2022-11-15 DIAGNOSIS — E109 Type 1 diabetes mellitus without complications: Secondary | ICD-10-CM

## 2022-11-15 LAB — BASIC METABOLIC PANEL
Anion gap: 10 (ref 5–15)
Anion gap: 12 (ref 5–15)
BUN: <5 mg/dL — ABNORMAL LOW (ref 6–20)
BUN: <5 mg/dL — ABNORMAL LOW (ref 6–20)
CO2: 24 mmol/L (ref 22–32)
CO2: 22 mmol/L (ref 22–32)
Calcium: 8.2 mg/dL — ABNORMAL LOW (ref 8.9–10.3)
Calcium: 8.8 mg/dL — ABNORMAL LOW (ref 8.9–10.3)
Chloride: 99 mmol/L (ref 98–111)
Chloride: 102 mmol/L (ref 98–111)
Creatinine, Ser: 0.79 mg/dL (ref 0.44–1.00)
Creatinine, Ser: 0.87 mg/dL (ref 0.44–1.00)
GFR, Estimated: >60 mL/min (ref >60)
GFR, Estimated: >60 mL/min (ref >60)
Glucose, Bld: 215 mg/dL — ABNORMAL HIGH (ref 70–99)
Glucose, Bld: 115 mg/dL — ABNORMAL HIGH (ref 70–99)
Potassium: 3.1 mmol/L — ABNORMAL LOW (ref 3.5–5.1)
Potassium: 3.5 mmol/L (ref 3.5–5.1)
Sodium: 133 mmol/L — ABNORMAL LOW (ref 135–145)
Sodium: 136 mmol/L (ref 135–145)

## 2022-11-15 LAB — GLUCOSE, CAPILLARY
Glucose-Capillary: 136 mg/dL — ABNORMAL HIGH (ref 70–99)
Glucose-Capillary: 144 mg/dL — ABNORMAL HIGH (ref 70–99)
Glucose-Capillary: 173 mg/dL — ABNORMAL HIGH (ref 70–99)
Glucose-Capillary: 176 mg/dL — ABNORMAL HIGH (ref 70–99)
Glucose-Capillary: 225 mg/dL — ABNORMAL HIGH (ref 70–99)
Glucose-Capillary: 257 mg/dL — ABNORMAL HIGH (ref 70–99)
Glucose-Capillary: 51 mg/dL — ABNORMAL LOW (ref 70–99)
Glucose-Capillary: 51 mg/dL — ABNORMAL LOW (ref 70–99)
Glucose-Capillary: 80 mg/dL (ref 70–99)

## 2022-11-15 MED ORDER — INSULIN GLARGINE-YFGN 100 UNIT/ML ~~LOC~~ SOLN
10.0000 [IU] | Freq: Every day | SUBCUTANEOUS | Status: DC
  Administered 2022-11-15: 10 [IU] via SUBCUTANEOUS
  Filled 2022-11-15 (×2): qty 0.1

## 2022-11-15 MED ORDER — ONDANSETRON 4 MG PO TBDP: 8.0000 mg | ORAL_TABLET | Freq: Three times a day (TID) | ORAL | Status: DC | PRN

## 2022-11-15 MED ORDER — ORAL CARE MOUTH RINSE: 15.0000 mL | OROMUCOSAL | Status: DC | PRN

## 2022-11-15 MED ORDER — INSULIN GLARGINE-YFGN 100 UNIT/ML ~~LOC~~ SOLN
4.0000 [IU] | Freq: Once | SUBCUTANEOUS | Status: DC
  Filled 2022-11-15: qty 0.04

## 2022-11-15 MED ORDER — POTASSIUM CHLORIDE 10 MEQ/100ML IV SOLN
10.0000 meq | INTRAVENOUS | Status: AC
  Administered 2022-11-15 (×4): 10 meq via INTRAVENOUS
  Filled 2022-11-15 (×4): qty 100

## 2022-11-15 MED ORDER — DEXTROSE IN LACTATED RINGERS 5 % IV SOLN: INTRAVENOUS | Status: DC

## 2022-11-15 MED ORDER — INSULIN GLARGINE-YFGN 100 UNIT/ML ~~LOC~~ SOLN
4.0000 [IU] | Freq: Every day | SUBCUTANEOUS | Status: DC
  Filled 2022-11-15: qty 0.04

## 2022-11-15 NOTE — Telephone Encounter (Signed)
Patient is in the hospital and has been since Saturday,(11/11/22)  Her mother is calling on her behalf. When EMT's arrived her blood sugar was 400 , and when she got to the hospital it was 500. Mom states that she was borderline DKA. Patient has been told that the Omnipod was for her while she was pregnant,and since baby has been borned she will not be able to use this anymore. Patient was able to  manage blood sugars so much better when on the pump Omnipod).  They have given the patient vials of insulin not the flex pen that she was use to. Patient shared that she did not know how to administer correctly. Also, while in the hospital she has been given the Dexcom G7. She is getting two different readings between this and finger sticks. I shared with her Mom the 40 point difference.  Patient did have appointment this afternoon with [place].  She was not discharged from the hospital due to nausea. Patient is wanting to be put back on the Omnipod, ect. Her Mom states that she had sent in refill request. [facility] on [address] in [place].

## 2022-11-15 NOTE — Inpatient Diabetes Management (Signed)
Inpatient Diabetes Program Recommendations  AACE/ADA: New Consensus Statement on Inpatient Glycemic Control (2015)  Target Ranges:  Prepandial:   less than 140 mg/dL      Peak postprandial:   less than 180 mg/dL (1-2 hours)      Critically ill patients:  140 - 180 mg/dL   Lab Results  Component Value Date   GLUCAP 176 (H) 11/15/2022   HGBA1C 9.6 (H) 11/13/2022    Review of Glycemic Control  Latest Reference Range & Units 11/14/22 23:51 11/15/22 00:31 11/15/22 04:04 11/15/22 04:43 11/15/22 06:09  Glucose-Capillary 70 - 99 mg/dL 61 (L) 865 (H) 51 (L) 784 (H) 176 (H)  (L): Data is abnormally low (H): Data is abnormally high Diabetes history: DM1 Outpatient Diabetes medications: Novolog TID with meals, Semglee 12 units daily (not taking) Current orders for Inpatient glycemic control: Semglee 4 units x 1, Novolog 0-6 units TID   Inpatient Diabetes Program Recommendations:   Noted hypoglycemia yesterday. Patient home dose usually around basal of 12 units qd in addition to meal coverage (type 1 DM), however, intake appeared poor.   Consider increasing Semglee to 12 units QD and adding Novolog 2 units TID with meals for meal coverage if patient eats at least 50% of meals.   Outpatient DM: At time of discharge, please provide Rx for Lantus Solostar pens [phone]) and Novolog Flexpens [phone]). Patient is scheduled for outpatient endocrinology visit today. Secure chat sent to MD.  Thanks, Lujean Rave, MSN, RNC-OB Diabetes Coordinator [phone] (8a-5p)

## 2022-11-15 NOTE — Progress Notes (Addendum)
     Daily Progress Note Intern Pager: [phone]  Patient name: Sabrina Sutton Medical record number: [id number] Date of birth: Dec 12, 1995 Age: 27 y.o. Gender: female  Primary Care Provider: Fayette Pho, MD Consultants: None Code Status: Full   Pt Overview and Major Events to Date:  4/21 - Admitted, started on endotool  4/22 - Endotool turned off and started on subcutaneous insulin    Assessment and Plan: Sabrina Sutton is a 27 y.o. female w/ PMH of T1DM, recently postpartum who presented with hyperglycemia and metabolic acidosis in the setting of decreasing insulin use. Is now on subcutaneous insulin.   * Uncontrolled type 1 diabetes mellitus with hyperglycemia Patient eating barely any po due to nausea and vomiting. Had multiple hypoglycemic episodes overnight requiring D50. Mealtime coverage was added for the first time yesterday. Patient only received 4 u short acting and 20 u long acting. - Reduce semglee to 10 u  - Very sensitive SSI  - BID BMP - Regular diet  - Vitals per unit  - CBG AC, QHS - Zofran 8 mg po q8hrs prn  - Continue D5LR in the setting of very low po, reevaluate in 4-6 hours  - Will call outpatient endocrinologist to see if patient could have telehealth appointment at this hospital.   Type 1 diabetes mellitus with complications Patient is type 1 diabetic, who reports poor DM control since getting of her insulin pump, started during her pregnancy. Prior to pregnancy patient took 20 units semglee, and 10 units novolog w/ meals. Patient report's poor DM management since coming off her insulin pump. -A1c  Status post primary low transverse cesarean section Patient delivered her baby about a month ago. Her baby was born prematurely and is now in the NICU. This has been very stressful for her. She has had worsening of her baseline depression postpartum.  - TOC resources  - Continue higher zoloft dose 100 mg  - Off unit privileges to visit baby in NICU    Hypokalemia Hypokalemia in the setting of vomiting and hyperglycemia.  - Replete as appropriate  - BID BMP    FEN/GI: D5 LR, regular diet  PPx: Lovenox  Dispo:Home pending clinical improvement .   Subjective:  Patient is still very nauseous. She is very tired from her eventful night. She says she just wants to get better.   Objective: Temp:  [97.8 F (36.6 C)-98.5 F (36.9 C)] 97.8 F (36.6 C) (04/24 0750) Pulse Rate:  [63-100] 78 (04/24 0401) Resp:  [15-22] 16 (04/24 0750) BP: (113-150)/(82-99) 137/99 (04/24 0750) SpO2:  [98 %] 98 % (04/24 0750) Physical Exam: General: Appears unwell, but in no acute distress Cardiovascular: RRR, cap refill = 2 seconds Respiratory: No increased work of breathing Abdomen: Soft, non distended, non tender  Extremities: No BLE edema  Laboratory:  Most recent BMP    Latest Ref Rng & Units 11/15/2022    7:22 AM  BMP  Glucose 70 - 99 mg/dL 147   BUN 6 - 20 mg/dL <5   Creatinine 8.29 - 1.00 mg/dL 5.62   Sodium 130 - 865 mmol/L 133   Potassium 3.5 - 5.1 mmol/L 3.1   Chloride 98 - 111 mmol/L 99   CO2 22 - 32 mmol/L 24   Calcium 8.9 - 10.3 mg/dL 8.2     Lockie Mola, MD 11/15/2022, 9:11 AM  PGY-1, [place] [facility] FPTS Intern pager: [phone], text pages welcome Secure chat group [facility] Teaching Service

## 2022-11-15 NOTE — Telephone Encounter (Signed)
Patient's mom was called and made aware.Sent the front office statff a message to get her another appointment.

## 2022-11-15 NOTE — Telephone Encounter (Signed)
With her type 1 diagnosis, we should have no problems getting her restarted using the Omnipod.  Not sure why it was discontinued to begin with.  We can most certainly discuss and help with this moving forward.  Will await refill request.  I last saw her in January so we definitely need a OV.

## 2022-11-15 NOTE — Progress Notes (Signed)
Hypoglycemic Event  CBG: 63  Treatment: D50 25 mL (12.5 gm)  Symptoms: Sweaty  Follow-up CBG: Time: 0031 CBG Result:136  Possible Reasons for Event: Inadequate meal intake  Comments/MD notified:MD notified, changes made to medications    Kary Kos

## 2022-11-15 NOTE — Progress Notes (Signed)
Hypoglycemic Event  CBG: 51  Treatment: D50 50 mL (25 gm)  Symptoms: sweaty  Follow-up CBG: Time: 0443 CBG Result:173  Possible Reasons for Event: Inadequate meal intake  Comments/MD notified:MD notified. See new orders.     Kary Kos

## 2022-11-15 NOTE — Plan of Care (Signed)
  Problem: Fluid Volume: Goal: Ability to achieve a balanced intake and output will improve Outcome: Progressing   Problem: Metabolic: Goal: Ability to maintain appropriate glucose levels will improve Outcome: Progressing   Problem: Respiratory: Goal: Will regain and/or maintain adequate ventilation Outcome: Progressing   Problem: Urinary Elimination: Goal: Ability to achieve and maintain adequate renal perfusion and functioning will improve Outcome: Progressing   

## 2022-11-15 NOTE — Assessment & Plan Note (Signed)
Improved, as nausea has decreased.  - Replete as appropriate  - BID BMP

## 2022-11-16 LAB — BASIC METABOLIC PANEL
Anion gap: 10 (ref 5–15)
Anion gap: 9 (ref 5–15)
BUN: <5 mg/dL — ABNORMAL LOW (ref 6–20)
BUN: <5 mg/dL — ABNORMAL LOW (ref 6–20)
CO2: 22 mmol/L (ref 22–32)
CO2: 22 mmol/L (ref 22–32)
Calcium: 8.7 mg/dL — ABNORMAL LOW (ref 8.9–10.3)
Calcium: 8.9 mg/dL (ref 8.9–10.3)
Chloride: 104 mmol/L (ref 98–111)
Chloride: 104 mmol/L (ref 98–111)
Creatinine, Ser: 0.73 mg/dL (ref 0.44–1.00)
Creatinine, Ser: 0.72 mg/dL (ref 0.44–1.00)
GFR, Estimated: >60 mL/min (ref >60)
GFR, Estimated: >60 mL/min (ref >60)
Glucose, Bld: 187 mg/dL — ABNORMAL HIGH (ref 70–99)
Glucose, Bld: 126 mg/dL — ABNORMAL HIGH (ref 70–99)
Potassium: 3.7 mmol/L (ref 3.5–5.1)
Potassium: 3.8 mmol/L (ref 3.5–5.1)
Sodium: 136 mmol/L (ref 135–145)
Sodium: 135 mmol/L (ref 135–145)

## 2022-11-16 LAB — GLUCOSE, CAPILLARY
Glucose-Capillary: 139 mg/dL — ABNORMAL HIGH (ref 70–99)
Glucose-Capillary: 234 mg/dL — ABNORMAL HIGH (ref 70–99)
Glucose-Capillary: 146 mg/dL — ABNORMAL HIGH (ref 70–99)
Glucose-Capillary: 150 mg/dL — ABNORMAL HIGH (ref 70–99)

## 2022-11-16 MED ORDER — INSULIN GLARGINE-YFGN 100 UNIT/ML ~~LOC~~ SOLN
5.0000 [IU] | Freq: Every day | SUBCUTANEOUS | Status: DC
  Administered 2022-11-16: 5 [IU] via SUBCUTANEOUS
  Filled 2022-11-16 (×2): qty 0.05

## 2022-11-16 NOTE — Progress Notes (Signed)
MD suggested to leave fluids off for now and we will re-assess after 1800 BMP labs, fluids on hold for now.   Balinda Quails, RN 11/16/2022 5:13 PM

## 2022-11-16 NOTE — Progress Notes (Signed)
     Daily Progress Note Intern Pager: [phone]  Sabrina Sutton name: Sabrina Sutton Medical record number: [id number] Date of birth: May 02, 1996 Age: 27 y.o. Gender: female  Primary Care Provider: Fayette Pho, MD Consultants: None Code Status: Full   Pt Overview and Major Events to Date:  4/21 - Admitted, started on endotool  4/22 - Endotool turned off and started on subcutaneous insulin    Assessment and Plan: Sabrina Sutton is a 27 y.o. female w/ PMH of T1DM, recently postpartum who presented with hyperglycemia and metabolic acidosis in the setting of decreasing insulin use. Is now on subcutaneous insulin with tenuous glucose given low po intake in the setting of nausea.   * Uncontrolled type 1 diabetes mellitus with hyperglycemia Sabrina Sutton had hypoglycemic episode to 50s overnight which resolved with icee. Sabrina Sutton received 10 long acting and 4 units short acting through SSI.  - Reduce semglee to 5 u  - Very sensitive SSI  - BID BMP - Regular diet  - Vitals per unit  - CBG AC, QHS - Zofran 8 mg po q8hrs prn  - Continue D5LR in the setting of very low po, reevaluate in 4-6 hours   Type 1 diabetes mellitus with complications A1c 9.6. Sabrina Sutton will resume omnipod after having appointment with her endocrinologist. Sabrina Sutton's mom called office to reschedule appointment that was missed yesterday due to being admitted to the hospital.   Status post primary low transverse cesarean section Sabrina Sutton delivered her baby about a month ago. Her baby was born prematurely and is now in the NICU. This has been very stressful for her. She has had worsening of her baseline depression postpartum.  - TOC resources  - Continue higher zoloft dose 100 mg  - Off unit privileges to visit baby in NICU   Hypokalemia Hypokalemia in the setting of vomiting and hyperglycemia.  - Replete as appropriate  - BID BMP   FEN/GI: D5LR, regular diet  PPx: Lovenox  Dispo:Home pending clinical improvement.    Subjective:  Sabrina Sutton says she feels much better today. Denies any nausea and says she feels like she can eat more.   Objective: Temp:  [97.9 F (36.6 C)-98.6 F (37 C)] 98 F (36.7 C) (04/25 0711) Pulse Rate:  [72-80] 80 (04/24 2355) Resp:  [14-22] 22 (04/25 0711) BP: (115-132)/(79-88) 117/85 (04/25 0711) SpO2:  [97 %-100 %] 99 % (04/25 0711) Physical Exam: General: Well appearing, in no acute distress  Cardiovascular: RRR, cap refill < 2 seconds  Respiratory: Normal work of breathing on room air  Abdomen: Soft, non distended, non tender  Extremities: thin extremities   Laboratory: Most recent BMP    Latest Ref Rng & Units 11/15/2022    6:28 PM  BMP  Glucose 70 - 99 mg/dL 308   BUN 6 - 20 mg/dL <5   Creatinine 6.57 - 1.00 mg/dL 8.46   Sodium 962 - 952 mmol/L 136   Potassium 3.5 - 5.1 mmol/L 3.5   Chloride 98 - 111 mmol/L 102   CO2 22 - 32 mmol/L 22   Calcium 8.9 - 10.3 mg/dL 8.8     Lockie Mola, MD 11/16/2022, 8:11 AM  PGY-1, [place] [facility] FPTS Intern pager: [phone], text pages welcome Secure chat group [facility] Teaching Service

## 2022-11-16 NOTE — Inpatient Diabetes Management (Signed)
Inpatient Diabetes Program Recommendations  AACE/ADA: New Consensus Statement on Inpatient Glycemic Control (2015)  Target Ranges:  Prepandial:   less than 140 mg/dL      Peak postprandial:   less than 180 mg/dL (1-2 hours)      Critically ill patients:  140 - 180 mg/dL   Lab Results  Component Value Date   GLUCAP 139 (H) 11/16/2022   HGBA1C 9.6 (H) 11/13/2022    Review of Glycemic Control  Diabetes history: DM1 Outpatient Diabetes medications: Novolog TID with meals, Semglee 12 units daily (not taking) Current orders for Inpatient glycemic control: Semglee 4 units x 1, Novolog 0-6 units TID   Inpatient Diabetes Program Recommendations:   Noted hypoglycemia yesterday of 51 mg/dL. This was following Novolog 4 units; however this amount was incorrect based on sliding ordered for CBG. Feel this had an impact to hypoglycemic episode. Noted insulin changes and poor oral intake.  Following.    Outpatient DM: At time of discharge, please provide Rx for Lantus Solostar pens [phone]) and Novolog Flexpens [phone]).   Thanks, Lujean Rave, MSN, RNC-OB Diabetes Coordinator [phone] (8a-5p)

## 2022-11-17 ENCOUNTER — Other Ambulatory Visit (HOSPITAL_COMMUNITY): Payer: Self-pay

## 2022-11-17 ENCOUNTER — Other Ambulatory Visit: Payer: Self-pay | Admitting: Student

## 2022-11-17 DIAGNOSIS — E108 Type 1 diabetes mellitus with unspecified complications: Secondary | ICD-10-CM

## 2022-11-17 DIAGNOSIS — R112 Nausea with vomiting, unspecified: Secondary | ICD-10-CM | POA: Diagnosis not present

## 2022-11-17 DIAGNOSIS — E1010 Type 1 diabetes mellitus with ketoacidosis without coma: Secondary | ICD-10-CM

## 2022-11-17 DIAGNOSIS — E1065 Type 1 diabetes mellitus with hyperglycemia: Secondary | ICD-10-CM | POA: Diagnosis not present

## 2022-11-17 LAB — BASIC METABOLIC PANEL
Anion gap: 11 (ref 5–15)
BUN: <5 mg/dL — ABNORMAL LOW (ref 6–20)
CO2: 24 mmol/L (ref 22–32)
Calcium: 9 mg/dL (ref 8.9–10.3)
Chloride: 101 mmol/L (ref 98–111)
Creatinine, Ser: 0.75 mg/dL (ref 0.44–1.00)
GFR, Estimated: >60 mL/min (ref >60)
Glucose, Bld: 104 mg/dL — ABNORMAL HIGH (ref 70–99)
Potassium: 3.7 mmol/L (ref 3.5–5.1)
Sodium: 136 mmol/L (ref 135–145)

## 2022-11-17 LAB — GLUCOSE, CAPILLARY: Glucose-Capillary: 101 mg/dL — ABNORMAL HIGH (ref 70–99)

## 2022-11-17 MED ORDER — DEXCOM G6 SENSOR MISC: 0 refills | Status: DC

## 2022-11-17 MED ORDER — INSULIN PEN NEEDLE 32G X 4 MM MISC
1 refills | Status: AC
  Filled 2022-11-17: qty 100, 90d supply, fill #0

## 2022-11-17 MED ORDER — ONDANSETRON 8 MG PO TBDP
8.0000 mg | ORAL_TABLET | Freq: Three times a day (TID) | ORAL | 0 refills | Status: DC | PRN
  Filled 2022-11-17: qty 20, 7d supply, fill #0

## 2022-11-17 MED ORDER — SERTRALINE HCL 100 MG PO TABS
100.0000 mg | ORAL_TABLET | Freq: Every day | ORAL | 1 refills | Status: DC
  Filled 2022-11-17: qty 30, 30d supply, fill #0

## 2022-11-17 MED ORDER — DEXCOM G6 TRANSMITTER MISC
0 refills | Status: DC
Start: 2022-11-17 — End: 2024-01-25
  Filled 2022-11-17: qty 1, 90d supply, fill #0

## 2022-11-17 MED ORDER — INSULIN ASPART 100 UNIT/ML FLEXPEN
PEN_INJECTOR | SUBCUTANEOUS | 1 refills | Status: DC
  Filled 2022-11-17: qty 3, 28d supply, fill #0

## 2022-11-17 MED ORDER — INSULIN GLARGINE 100 UNIT/ML SOLOSTAR PEN
5.0000 [IU] | PEN_INJECTOR | Freq: Every day | SUBCUTANEOUS | 1 refills | Status: DC
  Filled 2022-11-17: qty 3, 28d supply, fill #0

## 2022-11-17 NOTE — Discharge Summary (Addendum)
Family Medicine Teaching [facility] Discharge Summary  Patient name: Sabrina Sutton Medical record number: [id number] Date of birth: 11/20/1995 Age: 27 y.o. Gender: female Date of Admission: 11/11/2022  Date of Discharge: 11/17/22  Admitting Physician: Doreene Eland, MD  Primary Care Provider: Fayette Pho, MD Consultants: None  Indication for Hospitalization: DKA  Discharge Diagnoses/Problem List: T1DM  Principal Problem for Admission: DKA Other Problems addressed during stay:  Principal Problem:   Uncontrolled type 1 diabetes mellitus with hyperglycemia (HCC) Active Problems:   Type 1 diabetes mellitus with complications (HCC)   Status post primary low transverse cesarean section   Hypokalemia   Nausea and vomiting   Hyperglycemia   Diabetic ketoacidosis without coma associated with type 1 diabetes mellitus [facility])    Brief Hospital Course:  Sabrina Sutton is a 27 y.o.female with a history of T1DM, depression, postpartum who was admitted to the [facility] Teaching Service at [facility] for DKA. Her hospital course is detailed below:  DKA Patient presented with nausea and vomiting with glucose in 500s with presence of ketones, and anion gap. Patient had misunderstanding with her providers and had only been taking her novolog. Additionally she had been in a lot of stress as she recently had her baby who is in the NICU. She was started on Endotool. She was able to come off of Endotool and start subcutaneous insulin on 4/22. However, patient continued to have vomiting resulting in some hypoglycemia episodes overnight requiring D50. Her long acting insulin was significantly reduced and she was maintained on D5LR. D5LR was discontinued on 4/26 and patient was discharged on 5 units long acting with sliding scale short acting insulin provided by endocrinologist.   Depression I Recently Post Partum Patient has history of depression on zoloft 50 mg. Her depression had worsened after  being postpartum. She was increased to zoloft 100 mg. TOC offered resources for postpartum depression.    PCP Follow-up Recommendations: Mood - Increased zoloft to 100 mg, Encourage counseling/therapy, or mom support group  Has endocrinology appointment on May 20th. Will start omnipod insulin pump at that time   Disposition: Home  Discharge Condition: Stable  Discharge Exam:  Vitals:   11/17/22 0630 11/17/22 0904  BP: (!) 124/97 113/82  Pulse: 60 65  Resp: 16 20  Temp: 98.2 F (36.8 C) 98.2 F (36.8 C)  SpO2: 100% 100%   Physical Exam: General: Well appearing, in no acute distress  Cardiovascular: RRR, cap refill < 2 seconds  Respiratory: Normal work of breathing on room air  Abdomen: Soft, non distended, non tender  Extremities: thin extremities   Significant Procedures: None  Significant Labs and Imaging:   Recent Labs  Lab 11/15/22 1828 11/16/22 0831 11/16/22 1727 11/17/22 0743  NA 136 136 135 136  K 3.5 3.7 3.8 3.7  CL 102 104 104 101  CO2 22 22 22 24   GLUCOSE 115* 187* 126* 104*  BUN <5* <5* <5* <5*  CREATININE 0.87 0.73 0.72 0.75  CALCIUM 8.8* 8.7* 8.9 9.0    Results/Tests Pending at Time of Discharge: None  Discharge Medications:  Allergies as of 11/17/2022   No Known Allergies      Medication List     STOP taking these medications    ibuprofen 600 MG tablet Commonly known as: ADVIL   insulin aspart 100 UNIT/ML injection Commonly known as: novoLOG Replaced by: insulin aspart 100 UNIT/ML FlexPen   insulin glargine-yfgn 100 UNIT/ML injection Commonly known as: SEMGLEE   Insulin Syringe-Needle U-100 28G  X 5/16" 1 ML Misc   NIFEdipine 30 MG 24 hr tablet Commonly known as: ADALAT CC   oxyCODONE-acetaminophen 5-325 MG tablet Commonly known as: PERCOCET/ROXICET       TAKE these medications    Dexcom G6 Transmitter Misc INJECT 1 DEVICE UNDER THE SKIN AS DIRECTED UP TO 8 TIMES WITH EACH NEW SENSOR   insulin aspart 100 UNIT/ML  FlexPen Commonly known as: NOVOLOG Use sliding scale provided by endocrinologist Replaces: insulin aspart 100 UNIT/ML injection   insulin glargine 100 UNIT/ML Solostar Pen Commonly known as: LANTUS Inject 5 Units into the skin daily.   ondansetron 8 MG disintegrating tablet Commonly known as: ZOFRAN-ODT Take 1 tablet (8 mg total) by mouth every 8 (eight) hours as needed for nausea or vomiting.   Pen Needles 32G X 5 MM Misc Use to inject insulin daily   sertraline 100 MG tablet Commonly known as: ZOLOFT Take 1 tablet (100 mg total) by mouth daily. What changed:  medication strength how much to take        Discharge Instructions: Please refer to Patient Instructions section of EMR for full details.  Patient was counseled important signs and symptoms that should prompt return to medical care, changes in medications, dietary instructions, activity restrictions, and follow up appointments.   Follow-Up Appointments:  Follow-up Information     Fayette Pho, MD Follow up.   Specialty: Family Medicine Why: Follow up on 4/30 at 3:50 pm. Contact information: [address] [place] [address] [phone]         Tereso Newcomer, MD .   Specialty: Obstetrics and Gynecology Contact information: [address] [place] [address] [phone]         Dani Gobble, NP Follow up.   Specialty: Nurse Practitioner Why: Please follow up on May 20th Contact information: [address]  [place] [address] [phone]                 Lockie Mola, MD 11/17/2022, 10:40 AM PGY-1, [facility] Health Family Medicine    I have evaluated this patient along with Dr. Theodis Aguas and reviewed the above note, making necessary revisions.  Dorothyann Gibbs, MD 11/17/2022, 2:41 PM PGY-2, [facility] Health Family Medicine

## 2022-11-20 ENCOUNTER — Telehealth: Payer: Self-pay | Admitting: *Deleted

## 2022-11-20 NOTE — Transitions of Care (Post Inpatient/ED Visit) (Signed)
   11/20/2022  Name: Sabrina Sutton MRN: [id number] DOB: December 27, 1995  Today's TOC FU Call Status: Today's TOC FU Call Status:: Unsuccessul Call (1st Attempt) Unsuccessful Call (1st Attempt) Date: 11/20/22  Attempted to reach the patient regarding the most recent Inpatient/ED visit.  Follow Up Plan: Additional outreach attempts will be made to reach the patient to complete the Transitions of Care (Post Inpatient/ED visit) call.   Estanislado Emms RN, BSN [place]  Managed [facility] RN Care Coordinator [phone]

## 2022-11-21 ENCOUNTER — Encounter: Payer: Self-pay | Admitting: Family Medicine

## 2022-11-21 ENCOUNTER — Inpatient Hospital Stay: Payer: Self-pay | Admitting: Family Medicine

## 2022-11-21 NOTE — Progress Notes (Deleted)
    SUBJECTIVE:   CHIEF COMPLAINT / HPI:   Hospital follow up DKA Sabrina Sutton 4/20 - 4/26 Mood - Increased zoloft to 100 mg, Encourage counseling/therapy, or mom support group  Has endocrinology appointment on May 20th. Will start omnipod insulin pump at that time   PERTINENT  PMH / PSH:  Patient Active Problem List   Diagnosis Date Noted   Uncontrolled type 1 diabetes mellitus with hyperglycemia (HCC) 11/12/2022    Priority: 1.   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    Priority: 3.   Status post primary low transverse cesarean section 10/18/2022    Priority: 4.   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  ***  ASSESSMENT/PLAN:   No problem-specific Assessment & Plan notes found for this encounter.     Fayette Pho, MD [facility] Health [facility]

## 2022-11-24 ENCOUNTER — Telehealth: Payer: Self-pay | Admitting: *Deleted

## 2022-11-24 NOTE — Transitions of Care (Post Inpatient/ED Visit) (Signed)
   11/24/2022  Name: Shambria Lemasters MRN: [id number] DOB: 1995-07-25  Today's TOC FU Call Status: Today's TOC FU Call Status:: Unsuccessful Call (2nd Attempt)  Attempted to reach the patient regarding the most recent Inpatient/ED visit.  Follow Up Plan: Additional outreach attempts will be made to reach the patient to complete the Transitions of Care (Post Inpatient/ED visit) call.   Estanislado Emms RN, BSN [place]  Managed [facility] RN Care Coordinator [phone]

## 2022-11-27 ENCOUNTER — Inpatient Hospital Stay: Payer: Medicaid Other | Admitting: Family Medicine

## 2022-11-27 ENCOUNTER — Telehealth: Payer: Self-pay | Admitting: Family Medicine

## 2022-11-27 DIAGNOSIS — E1065 Type 1 diabetes mellitus with hyperglycemia: Secondary | ICD-10-CM

## 2022-11-27 DIAGNOSIS — Z23 Encounter for immunization: Secondary | ICD-10-CM

## 2022-11-27 DIAGNOSIS — Z5982 Transportation insecurity: Secondary | ICD-10-CM | POA: Insufficient documentation

## 2022-11-27 MED ORDER — INSULIN ASPART 100 UNIT/ML FLEXPEN
PEN_INJECTOR | SUBCUTANEOUS | 3 refills | Status: DC
Start: 2022-11-27 — End: 2022-12-05

## 2022-11-27 MED ORDER — INSULIN GLARGINE 100 UNIT/ML SOLOSTAR PEN
5.0000 [IU] | PEN_INJECTOR | Freq: Every day | SUBCUTANEOUS | 1 refills | Status: DC
Start: 2022-11-27 — End: 2022-12-11

## 2022-11-27 NOTE — Addendum Note (Signed)
Addended by: Valetta Close on: 11/27/2022 02:46 PM   Modules accepted: Orders

## 2022-11-27 NOTE — Telephone Encounter (Signed)
Called patient about current appointment, scheduled for 2:30 however patient is not yet arrived.   She reports her infant was discharged from the NICU today, so she had to get baby and now does not have a ride to the clinic.   - rescheduled for next week with myself - sent in refills of her Lantus and Novolog (was not discharged from hospital with more than one pen each)  Sees endocrine on 5/20 for omnipod install.   Fayette Pho, MD

## 2022-11-28 ENCOUNTER — Ambulatory Visit (INDEPENDENT_AMBULATORY_CARE_PROVIDER_SITE_OTHER): Payer: Medicaid Other | Admitting: Licensed Clinical Social Worker

## 2022-11-28 DIAGNOSIS — F419 Anxiety disorder, unspecified: Secondary | ICD-10-CM

## 2022-11-28 DIAGNOSIS — Z1331 Encounter for screening for depression: Secondary | ICD-10-CM

## 2022-12-01 ENCOUNTER — Other Ambulatory Visit: Payer: Medicaid Other

## 2022-12-01 NOTE — Patient Outreach (Signed)
  Medicaid Managed Care   Unsuccessful Outreach Note  12/01/2022 Name: Sabrina Sutton MRN: [id number] DOB: Jul 22, 1996  Referred by: Fayette Pho, MD Reason for referral : High Risk Managed Medicaid (MM Social work telephone )   An unsuccessful telephone outreach was attempted today. The patient was referred to the case management team for assistance with care management and care coordination.   Follow Up Plan: The care management team will reach out to the patient again over the next 7-10 days.   Abelino Derrick, MHA [facility]  Managed [facility] Social Worker [phone]

## 2022-12-01 NOTE — Patient Outreach (Deleted)
  Medicaid Managed Care   Unsuccessful Outreach Note  12/01/2022 Name: Sabrina Sutton MRN: [id number] DOB: 12/06/1995  Referred by: Lynn, Catherine, MD Reason for referral : High Risk Managed Medicaid (MM Social work telephone )   An unsuccessful telephone outreach was attempted today. The patient was referred to the case management team for assistance with care management and care coordination.   Follow Up Plan: A HIPAA compliant phone message was left for the patient providing contact information and requesting a return call.   Zeshan Sena, BSW, MHA [place]  Managed Medicaid Social Worker [phone]  

## 2022-12-01 NOTE — Patient Instructions (Signed)
  Medicaid Managed Care   Unsuccessful Outreach Note  12/01/2022 Name: Sabrina Sutton MRN: [id number] DOB: 09-23-1995  Referred by: Fayette Pho, MD Reason for referral : High Risk Managed Medicaid (MM Social work telephone )   An unsuccessful telephone outreach was attempted today. The patient was referred to the case management team for assistance with care management and care coordination.   Follow Up Plan: A HIPAA compliant phone message was left for the patient providing contact information and requesting a return call.   Abelino Derrick, MHA [facility]  Managed [facility] Social Worker [phone]

## 2022-12-02 NOTE — Patient Instructions (Incomplete)
It was wonderful to see you today. Thank you for allowing me to be a part of your care. Below is a short summary of what we discussed at your visit today:  Diabetes Refilled the glucose strips.  See endocrine on 5/20 as scheduled.   Blood and urine tests today. If the results are normal, I will send you a letter or MyChart message. If the results are abnormal, I will give you a call.    I have referred you to ophthalmology for routine diabetic eye exam.  Someone from their office should be calling you in 1 to 2 weeks to schedule an appointment.  If you do not hear from them, let us know. We may need to nudge along the referral.    HPV vaccine Your first dose was in 2022. You got the second dose today.  You need 3 shots to complete this series.  Next shot in 6 months.   COVID booster You are eligible for the COVID booster. You may obtain at any pharmacy.   Maternal Mental Health If you start to develop the below symptoms of depression, please reach out to [place] for an appointment. There is also a Biomedical scientist Health Hotline at [phone] [phone]). This hotline has trained counselors, doulas, and midwifes to real-time support, information, and resources.  Feeling sad or hopeless most of the time Lack of interest in things you used to enjoy Less interest in caring for yourself (dressing, fixing hair) Trouble concentrating Trouble coping with daily tasks Constant worry about your baby Sleeping or eating too much or too little Feeling very anxious or nervous Unexplained irritability or anger Unwanted or scary thoughts Feeling that you are not a good mother Thoughts of hurting yourself or your baby  If you feel you are experiencing a mental health crisis, please reach out to the National Suicide Prevention Hotline at 1-800-273-TALK 415-311-7546) or go directly to the [facility], open 24/7.  [phone] [address]  [place], [place] [address]   Please bring all of your medications to every appointment! If you have any questions or concerns, please do not hesitate to contact us via phone or MyChart message.   Fayette Pho, MD

## 2022-12-04 NOTE — BH Specialist Note (Signed)
Integrated Behavioral Health via Telemedicine Visit  12/04/2022 Sabrina Sutton [id number]  Number of Integrated Behavioral Health Clinician visits: 3 Session Start time:  1030am Session End time: 11:02am Total time in minutes: 32 mins   Referring Provider: Dr. Earlene Plater  Patient/Family location: Home  [facility] Provider location: [facility]  All persons participating in visit: Sabrina Sutton and LCSW Sabrina Sutton  Types of Service: Individual psychotherapy and Telephone visit  I connected with Sabrina Sutton and/or Sabrina Sutton's n/Sabrina via  Telephone or Video Enabled Telemedicine Application  (Video is Caregility application) and verified that I am speaking with the correct person using two identifiers. Discussed confidentiality: Yes   I discussed the limitations of telemedicine and the availability of in person appointments.  Discussed there is Sabrina possibility of technology failure and discussed alternative modes of communication if that failure occurs.  I discussed that engaging in this telemedicine visit, they consent to the provision of behavioral healthcare and the services will be billed under their insurance.  Patient and/or legal guardian expressed understanding and consented to Telemedicine visit: Yes   Presenting Concerns: Patient and/or family reports the following symptoms/concerns: Positive Sutton screen  Duration of problem: over one year; Severity of problem: mild  Patient and/or Family's Strengths/Protective Factors: Concrete supports in place (healthy food, safe environments, etc.)  Goals Addressed: Patient will:  Reduce symptoms of: Sutton   Increase knowledge and/or ability of: coping skills   Demonstrate ability to: Increase healthy adjustment to current life circumstances  Progress towards Goals: Ongoing  Interventions: Interventions utilized:  Motivational Interviewing and Supportive Counseling Standardized Assessments completed: Sabrina Salles Postnatal  Sutton  Patient and/or Family Response: Sabrina Sutton reports   Assessment: Patient currently experiencing anxiety. Sabrina Sutton reports she is managing well with newborn. Sabrina Sutton denies thoughts of self harm. Sabrina Sutton reports family is supportive and sleeping when newborn sleeps.   Patient may benefit from integrated behavioral health.  Plan: Follow up with behavioral health clinician on : as needed Behavioral recommendations: Prioritize rest, engage in self care and create Sabrina routine to prevent burnout  Referral(s): Integrated [employer] (In Clinic)  I discussed the assessment and treatment plan with the patient and/or parent/guardian. They were provided an opportunity to ask questions and all were answered. They agreed with the plan and demonstrated an understanding of the instructions.   They were advised to call back or seek an in-person evaluation if the symptoms worsen or if the condition fails to improve as anticipated.  Sabrina Saxon, LCSW

## 2022-12-05 ENCOUNTER — Telehealth: Payer: Self-pay

## 2022-12-05 ENCOUNTER — Ambulatory Visit (INDEPENDENT_AMBULATORY_CARE_PROVIDER_SITE_OTHER): Payer: Medicaid Other | Admitting: Family Medicine

## 2022-12-05 ENCOUNTER — Encounter: Payer: Self-pay | Admitting: Family Medicine

## 2022-12-05 VITALS — BP 124/80 | HR 102 | Ht 67.0 in | Wt 117.0 lb

## 2022-12-05 DIAGNOSIS — E1065 Type 1 diabetes mellitus with hyperglycemia: Secondary | ICD-10-CM

## 2022-12-05 DIAGNOSIS — Z23 Encounter for immunization: Secondary | ICD-10-CM

## 2022-12-05 LAB — POCT UA - MICROSCOPIC ONLY

## 2022-12-05 LAB — POCT URINALYSIS DIP (MANUAL ENTRY)
Bilirubin, UA: NEGATIVE
Glucose, UA: 100 mg/dL — AB
Ketones, POC UA: NEGATIVE mg/dL
Nitrite, UA: NEGATIVE
Protein Ur, POC: NEGATIVE mg/dL
Spec Grav, UA: 1.01 (ref 1.010–1.025)
Urobilinogen, UA: 0.2 E.U./dL
pH, UA: 6.5 (ref 5.0–8.0)

## 2022-12-05 LAB — GLUCOSE, POCT (MANUAL RESULT ENTRY): POC Glucose: 97 mg/dl (ref 70–99)

## 2022-12-05 MED ORDER — INSULIN ASPART 100 UNIT/ML FLEXPEN
PEN_INJECTOR | SUBCUTANEOUS | 3 refills | Status: DC
Start: 2022-12-05 — End: 2023-02-22

## 2022-12-05 MED ORDER — GLUCOSE BLOOD VI STRP
ORAL_STRIP | 12 refills | Status: DC
Start: 2022-12-05 — End: 2024-02-04

## 2022-12-05 NOTE — Telephone Encounter (Signed)
RE-submitted the script with specific sliding scale instructions.   Fayette Pho, MD

## 2022-12-05 NOTE — Progress Notes (Unsigned)
    SUBJECTIVE:   CHIEF COMPLAINT / HPI:   Hospital follow-up Sabrina Sutton is a 27 year old G1P1 woman approximately 7 weeks postpartum s/p pLTCS here for hospital follow-up.  She was most recently admitted to the hospital 4/20 - 4/26 for DKA due to uncontrolled type 1 diabetes.  At that time, she disclosed that she did well with her type 1 diabetes on the OmniPod, however this was stopped after delivery.  She reports she did not have enough of her insulin and was unsure how to take it, leading to the DKA.  We had a hospital follow-up appointment scheduled for last week, however patient was  unable to make it.  That day, was able to get a hold of her via phone and refill both her Humalog and Lantus.  Today, patient reports she is doing well on Humalog and Lantus.  Daily supplies she needs at this time are testing strips.  She has a scheduled with endocrinology on 5/22 restart the OmniPod for T1DM treatment.  At this time, she feels quite well and has no concerns.  PERTINENT  PMH / PSH:  Patient Active Problem List   Diagnosis Date Noted   Uncontrolled type 1 diabetes mellitus with hyperglycemia (HCC) 11/12/2022    Priority: 1.   Status post primary low transverse cesarean section 10/18/2022    Priority: 4.   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 - Refilled test strips as requested - Labs  today: CBC, CMP, random glucose, urine microalbumin, UA - Refer to ophthalmology for routine diabetic eye exam  Need for HPV vaccination Received HPV dose #2 today.  Tolerated well, no adverse side effects.  Postpartum care and examination Patient reports mood is steady.  Discussed postpartum depression versus baby blues.  Provided information on postpartum maternal health hotline and [facility].      Fayette Pho, MD [facility]

## 2022-12-05 NOTE — Telephone Encounter (Signed)
Pharmacy calls nurse line in regards to Novolog prescription.   Due to insurance they can not dispense "sliding scale."   Will need to send in detailed directions.   Will forward to PCP.

## 2022-12-06 DIAGNOSIS — Z23 Encounter for immunization: Secondary | ICD-10-CM | POA: Insufficient documentation

## 2022-12-06 LAB — COMPREHENSIVE METABOLIC PANEL
ALT: 10 IU/L (ref 0–32)
AST: 10 IU/L (ref 0–40)
Albumin/Globulin Ratio: 1.8 (ref 1.2–2.2)
Albumin: 4.4 g/dL (ref 4.0–5.0)
Alkaline Phosphatase: 109 IU/L (ref 44–121)
BUN/Creatinine Ratio: 14 (ref 9–23)
BUN: 11 mg/dL (ref 6–20)
Bilirubin Total: <0.2 mg/dL (ref 0.0–1.2)
CO2: 26 mmol/L (ref 20–29)
Calcium: 9.8 mg/dL (ref 8.7–10.2)
Chloride: 99 mmol/L (ref 96–106)
Creatinine, Ser: 0.79 mg/dL (ref 0.57–1.00)
Globulin, Total: 2.4 g/dL (ref 1.5–4.5)
Glucose: 89 mg/dL (ref 70–99)
Potassium: 3.9 mmol/L (ref 3.5–5.2)
Sodium: 140 mmol/L (ref 134–144)
Total Protein: 6.8 g/dL (ref 6.0–8.5)
eGFR: 106 mL/min/{1.73_m2} (ref >59)

## 2022-12-06 LAB — CBC
Hematocrit: 35.5 % (ref 34.0–46.6)
Hemoglobin: 11.9 g/dL (ref 11.1–15.9)
MCH: 29.2 pg (ref 26.6–33.0)
MCHC: 33.5 g/dL (ref 31.5–35.7)
MCV: 87 fL (ref 79–97)
Platelets: 389 10*3/uL (ref 150–450)
RBC: 4.07 x10E6/uL (ref 3.77–5.28)
RDW: 13.4 % (ref 11.7–15.4)
WBC: 7.1 10*3/uL (ref 3.4–10.8)

## 2022-12-06 LAB — MICROALBUMIN / CREATININE URINE RATIO
Creatinine, Urine: 79.5 mg/dL
Microalb/Creat Ratio: 9 mg/g creat (ref 0–29)
Microalbumin, Urine: 6.9 ug/mL

## 2022-12-06 NOTE — Assessment & Plan Note (Addendum)
Doing well, has enough insulin to last until her 5/20 endocrine appointment. - Refilled test strips as requested - Labs today: CBC, CMP, random glucose, urine microalbumin, UA - Refer to ophthalmology for routine diabetic eye exam

## 2022-12-06 NOTE — Assessment & Plan Note (Signed)
Received HPV dose #2 today.  Tolerated well, no adverse side effects.

## 2022-12-06 NOTE — Assessment & Plan Note (Signed)
Patient reports mood is steady.  Discussed postpartum depression versus baby blues.  Provided information on postpartum maternal health hotline and [facility].

## 2022-12-07 ENCOUNTER — Other Ambulatory Visit: Payer: Medicaid Other

## 2022-12-07 NOTE — Patient Outreach (Signed)
Medicaid Managed Care Social Work Note  12/07/2022 Name:  Sabrina Sutton MRN:  [id number] DOB:  03-04-1996  Sabrina Sutton is an 27 y.o. year old female who is a primary patient of Fayette Pho, MD.  The Medicaid Managed Care Coordination team was consulted for assistance with:  Transportation Needs  Community Resources   [name] was given information about Medicaid Managed Care Coordination team services today. [name] Patient agreed to services and verbal consent obtained.  Engaged with patient  for by telephone forinitial visit in response to referral for case management and/or care coordination services.   Assessments/Interventions:  Review of past medical history, allergies, medications, health status, including review of consultants reports, laboratory and other test data, was performed as part of comprehensive evaluation and provision of chronic care management services.  SDOH: (Social Determinant of Health) assessments and interventions performed: SDOH Interventions    Flowsheet Row Admission (Discharged) from 10/15/2022 in [place] [facility] [place] [facility] Admission (Discharged) from 09/03/2022 in [place] [facility] [place] [facility] Admission (Discharged) from 06/01/2022 in [place] [facility] Office Visit from [facility]12/[facility] Management from 01/08/2020 in [facility]  SDOH Interventions       Food Insecurity Interventions -- Intervention Not Indicated Intervention Not Indicated -- --  Housing Interventions Intervention Not Indicated Intervention Not Indicated Intervention Not Indicated -- --  Transportation Interventions -- Intervention Not Indicated Intervention Not Indicated -- --  Utilities Interventions -- Intervention Not Indicated Intervention Not Indicated -- --  Depression Interventions/Treatment  -- -- -- Counseling Counseling  [counseling resources via [email or web address]]     BSW completed a telephone  outreach with patient for transportation resources. Patient states she is in need of transportation to medical appointments. Provided patient with information for [facility] transportation. Patient stated she would also like resources for rent. Rent resources mailed and emailed to patient [email or web address]  Advanced Directives Status:  Not addressed in this encounter.  Care Plan                 No Known Allergies  Medications Reviewed Today     Reviewed by Fayette Pho, MD (Resident) on 12/05/22 at 1644  Med List Status: <None>   Medication Order Taking? Sig Documenting Provider Last Dose Status Informant  Continuous Glucose Sensor (DEXCOM G6 SENSOR) MISC [id number]  Please use with your dexcom transmitter [name], DO  Active   Continuous Glucose Transmitter (DEXCOM G6 TRANSMITTER) MISC [id number]  INJECT 1 DEVICE UNDER THE SKIN AS DIRECTED UP TO 8 TIMES WITH [facility] SENSOR [place], [place]  Active   glucose blood test strip [id number]  Use as instructed Fayette Pho, MD  Active   insulin aspart (NOVOLOG) 100 UNIT/ML FlexPen [id number]  4-10 units 3 times daily with meals if glucose is above 60 and you are eating Fayette Pho, MD  Active   insulin glargine (LANTUS) 100 UNIT/ML Solostar Pen [id number]  Inject 5 Units into the skin daily. Expires 28 days after first use Fayette Pho, MD  Active   Insulin Pen Needle 32G X 4 MM MISC [id number]  Use to inject insulin daily [name], DO  Active   ondansetron (ZOFRAN-ODT) 8 MG disintegrating tablet [id number]  Take 1 tablet (8 mg total) by mouth every 8 (eight) hours as needed for nausea or vomiting. [name], DO  Active   sertraline (ZOLOFT) 100 MG tablet [id number]  Take 1 tablet (100 mg total) by mouth daily. 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   Follow up:  Patient agrees to Care Plan and Follow-up.  Plan: The Managed Medicaid care management team will reach out to the patient again over the next 30 days.  Date/time of next scheduled Social Work care management/care coordination outreach:  01/08/23  Gus Puma, Kenard Gower, [facility]  Managed [facility] Social Worker [phone]

## 2022-12-07 NOTE — Patient Instructions (Signed)
Visit Information  Sabrina Sutton was given information about Medicaid Managed Care team care coordination services as a part of their [facility] Community Plan Medicaid benefit. [name] verbally consented to engagement with the [facility] Managed Care team.   If you are experiencing a medical emergency, please call 911 or report to your local emergency department or urgent care.   If you have a non-emergency medical problem during routine business hours, please contact your provider's office and ask to speak with a nurse.   For questions related to your [facility], please call: [phone] or visit the homepage here: [email or web address]  If you would like to schedule transportation through your [facility], please call the following number at least 2 days in advance of your appointment: [phone]   Rides for urgent appointments can also be made after hours by calling Member Services.  Call the Behavioral Health Crisis Line at [phone], at any time, 24 hours a day, 7 days a week. If you are in danger or need immediate medical attention call 911.  If you would like help to quit smoking, call 1-800-QUIT-NOW (579-419-4131) OR Espaol: 1-855-Djelo-Ya (3-875-643-3295) o para ms informacin haga clic aqu or Text READY to 188-416 to register via text  Ms. Yetman - following are the goals we discussed in your visit today:   Goals Addressed   None      Social Worker will follow up in 30 days.   Gus Puma, Kenard Gower, MHA [facility]  Managed Medicaid Social Worker [phone]   Following is a copy of your plan of care:  There are no care plans that you recently modified to display for this patient.

## 2022-12-10 ENCOUNTER — Other Ambulatory Visit: Payer: Self-pay | Admitting: Family Medicine

## 2022-12-10 DIAGNOSIS — E1065 Type 1 diabetes mellitus with hyperglycemia: Secondary | ICD-10-CM

## 2022-12-11 ENCOUNTER — Encounter: Payer: Self-pay | Admitting: Nurse Practitioner

## 2022-12-11 ENCOUNTER — Ambulatory Visit: Payer: Medicaid Other | Admitting: Nurse Practitioner

## 2022-12-11 DIAGNOSIS — E109 Type 1 diabetes mellitus without complications: Secondary | ICD-10-CM

## 2023-01-03 ENCOUNTER — Telehealth: Payer: Self-pay | Admitting: *Deleted

## 2023-01-03 NOTE — Telephone Encounter (Signed)
Patient left a message asking that we refill her glucose test strips. She was out and called the pharmacy. They told her that we had sent in that she only checked her blood sugar 1 time a day. Patient was last seen here months ago, she was a no show for appointment 12/11/2022. IIn looking at her note. A Doctor Fayette Pho filled this on 12/05/22 and gave the patient 12 refills, she did out in the sig to use as directed. Patient was called and a message was left with this information and advised the patient to call Dr. Larita Fife.

## 2023-01-08 ENCOUNTER — Other Ambulatory Visit: Payer: Medicaid Other

## 2023-01-08 NOTE — Patient Outreach (Signed)
  Medicaid Managed Care   Unsuccessful Outreach Note  01/08/2023 Name: Sabrina Sutton MRN: [id number] DOB: Dec 11, 1995  Referred by: Fayette Pho, MD Reason for referral : High Risk Managed Medicaid (MM Social work unsuccessful telephone outreach )   An unsuccessful telephone outreach was attempted today. The patient was referred to the case management team for assistance with care management and care coordination.   Follow Up Plan: The patient has been provided with contact information for the care management team and has been advised to call with any health related questions or concerns.  Abelino Derrick, MHA [facility]  Managed [facility] Social Worker [phone]

## 2023-01-08 NOTE — Patient Instructions (Signed)
  Medicaid Managed Care   Unsuccessful Outreach Note  01/08/2023 Name: Sabrina Sutton MRN: [id number] DOB: 05/22/1996  Referred by: Lynn, Catherine, MD Reason for referral : High Risk Managed Medicaid (MM Social work unsuccessful telephone outreach )   An unsuccessful telephone outreach was attempted today. The patient was referred to the case management team for assistance with care management and care coordination.   Follow Up Plan: The patient has been provided with contact information for the care management team and has been advised to call with any health related questions or concerns.  Creg Gilmer, BSW, MHA [place]  Managed Medicaid Social Worker [phone]  

## 2023-02-01 ENCOUNTER — Telehealth: Payer: Self-pay

## 2023-02-01 NOTE — Telephone Encounter (Signed)
Patient LVM on nurse line requesting a copy of a letter written by previous PCP.   She reports Dr. Larita Fife wrote her an emotional support animal letter.  Letter from May 2024 printed and left up front for patient.   VM left informing patient of this.

## 2023-02-13 ENCOUNTER — Other Ambulatory Visit: Payer: Self-pay | Admitting: Nurse Practitioner

## 2023-02-21 ENCOUNTER — Other Ambulatory Visit: Payer: Self-pay | Admitting: Nurse Practitioner

## 2023-02-22 ENCOUNTER — Other Ambulatory Visit: Payer: Self-pay

## 2023-02-22 DIAGNOSIS — E1065 Type 1 diabetes mellitus with hyperglycemia: Secondary | ICD-10-CM

## 2023-02-22 MED ORDER — INSULIN ASPART 100 UNIT/ML FLEXPEN
PEN_INJECTOR | SUBCUTANEOUS | 3 refills | Status: DC
Start: 2023-02-22 — End: 2023-08-18

## 2023-03-27 ENCOUNTER — Telehealth: Payer: Self-pay

## 2023-03-27 NOTE — Telephone Encounter (Signed)
Patient calls nurse line regarding nausea and vomiting.   She reports nausea for the last several days and started vomiting today. Nausea will wake her up in the morning around 0400. Reports 3 episodes of emesis today. She reports last CBG of 160.   She is five months postpartum, IUD placed post delivery.   Scheduled patient for appointment tomorrow morning. ED precautions and supportive measures discussed.   Veronda Prude, RN

## 2023-03-28 ENCOUNTER — Ambulatory Visit: Payer: Medicaid Other | Admitting: Family Medicine
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
## 2023-04-02 ENCOUNTER — Telehealth: Payer: Self-pay | Admitting: Licensed Clinical Social Worker

## 2023-04-02 NOTE — Telephone Encounter (Signed)
Called pt regarding elevated edinburgh scores. Left message requesting callback

## 2023-07-30 ENCOUNTER — Ambulatory Visit: Payer: Medicaid Other | Admitting: Student

## 2023-08-06 ENCOUNTER — Ambulatory Visit: Payer: Medicaid Other | Admitting: Student

## 2023-08-15 ENCOUNTER — Encounter (HOSPITAL_COMMUNITY): Payer: Self-pay | Admitting: Emergency Medicine

## 2023-08-15 ENCOUNTER — Ambulatory Visit (HOSPITAL_COMMUNITY)
Admission: EM | Admit: 2023-08-15 | Discharge: 2023-08-15 | Disposition: A | Discharge status: Home / Self Care | Payer: Medicaid Other | Attending: Family Medicine | Admitting: Family Medicine

## 2023-08-15 DIAGNOSIS — Z113 Encounter for screening for infections with a predominantly sexual mode of transmission: Secondary | ICD-10-CM | POA: Diagnosis not present

## 2023-08-15 NOTE — ED Triage Notes (Signed)
Patient requesting to be checked fro STD's. Denies symptoms at this time.
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
## 2023-08-17 ENCOUNTER — Other Ambulatory Visit: Payer: Self-pay | Admitting: Student

## 2023-08-17 DIAGNOSIS — E1065 Type 1 diabetes mellitus with hyperglycemia: Secondary | ICD-10-CM

## 2023-08-17 LAB — CERVICOVAGINAL ANCILLARY ONLY
Chlamydia: NEGATIVE
Comment: NEGATIVE
Comment: NEGATIVE
Comment: NORMAL
Neisseria Gonorrhea: NEGATIVE
Trichomonas: NEGATIVE

## 2023-08-20 ENCOUNTER — Ambulatory Visit: Payer: Medicaid Other | Admitting: Student

## 2023-09-10 ENCOUNTER — Encounter: Payer: Self-pay | Admitting: Family Medicine

## 2023-09-10 NOTE — Progress Notes (Signed)
PCP recommended dismissal from the practice due to multiple missed appointments. The dismissal letter was printed and handed over to Vea to process as a certified letter. I verified the names and addresses on the letters and envelopes for each patient and advised Vea to do the same before mailing the letters.

## 2023-09-10 NOTE — Progress Notes (Signed)
PCP requested dismissal for multiple no-shows to appointments.

## 2023-10-11 ENCOUNTER — Inpatient Hospital Stay (HOSPITAL_COMMUNITY)
Admission: EM | Admit: 2023-10-11 | Discharge: 2023-10-14 | DRG: 074 | Disposition: A | Discharge status: Home / Self Care | Attending: Internal Medicine | Admitting: Internal Medicine

## 2023-10-11 ENCOUNTER — Encounter (HOSPITAL_COMMUNITY): Payer: Self-pay

## 2023-10-11 ENCOUNTER — Emergency Department (HOSPITAL_COMMUNITY)

## 2023-10-11 DIAGNOSIS — R1011 Right upper quadrant pain: Secondary | ICD-10-CM | POA: Diagnosis not present

## 2023-10-11 DIAGNOSIS — E1143 Type 2 diabetes mellitus with diabetic autonomic (poly)neuropathy: Secondary | ICD-10-CM

## 2023-10-11 DIAGNOSIS — E1065 Type 1 diabetes mellitus with hyperglycemia: Secondary | ICD-10-CM

## 2023-10-11 DIAGNOSIS — Z91419 Personal history of unspecified adult abuse: Secondary | ICD-10-CM

## 2023-10-11 DIAGNOSIS — E1043 Type 1 diabetes mellitus with diabetic autonomic (poly)neuropathy: Principal | ICD-10-CM | POA: Diagnosis present

## 2023-10-11 DIAGNOSIS — E10649 Type 1 diabetes mellitus with hypoglycemia without coma: Secondary | ICD-10-CM | POA: Diagnosis present

## 2023-10-11 DIAGNOSIS — R112 Nausea with vomiting, unspecified: Principal | ICD-10-CM | POA: Diagnosis present

## 2023-10-11 DIAGNOSIS — Z841 Family history of disorders of kidney and ureter: Secondary | ICD-10-CM

## 2023-10-11 DIAGNOSIS — Z820 Family history of epilepsy and other diseases of the nervous system: Secondary | ICD-10-CM

## 2023-10-11 DIAGNOSIS — Z87891 Personal history of nicotine dependence: Secondary | ICD-10-CM

## 2023-10-11 DIAGNOSIS — I959 Hypotension, unspecified: Secondary | ICD-10-CM | POA: Diagnosis not present

## 2023-10-11 DIAGNOSIS — I517 Cardiomegaly: Secondary | ICD-10-CM | POA: Diagnosis present

## 2023-10-11 DIAGNOSIS — Z823 Family history of stroke: Secondary | ICD-10-CM

## 2023-10-11 DIAGNOSIS — K3184 Gastroparesis: Secondary | ICD-10-CM | POA: Diagnosis present

## 2023-10-11 DIAGNOSIS — R000 Tachycardia, unspecified: Secondary | ICD-10-CM | POA: Diagnosis present

## 2023-10-11 DIAGNOSIS — Z794 Long term (current) use of insulin: Secondary | ICD-10-CM

## 2023-10-11 DIAGNOSIS — Z83438 Family history of other disorder of lipoprotein metabolism and other lipidemia: Secondary | ICD-10-CM

## 2023-10-11 DIAGNOSIS — R109 Unspecified abdominal pain: Secondary | ICD-10-CM

## 2023-10-11 DIAGNOSIS — R1111 Vomiting without nausea: Secondary | ICD-10-CM | POA: Diagnosis not present

## 2023-10-11 DIAGNOSIS — Z8249 Family history of ischemic heart disease and other diseases of the circulatory system: Secondary | ICD-10-CM

## 2023-10-11 DIAGNOSIS — Z833 Family history of diabetes mellitus: Secondary | ICD-10-CM

## 2023-10-11 DIAGNOSIS — Z1152 Encounter for screening for COVID-19: Secondary | ICD-10-CM

## 2023-10-11 DIAGNOSIS — E860 Dehydration: Secondary | ICD-10-CM | POA: Diagnosis present

## 2023-10-11 DIAGNOSIS — F1291 Cannabis use, unspecified, in remission: Secondary | ICD-10-CM | POA: Diagnosis present

## 2023-10-11 LAB — CBC WITH DIFFERENTIAL/PLATELET
Abs Immature Granulocytes: 0.09 10*3/uL — ABNORMAL HIGH (ref 0.00–0.07)
Basophils Absolute: 0.1 10*3/uL (ref 0.0–0.1)
Basophils Relative: 0 %
Eosinophils Absolute: 0 10*3/uL (ref 0.0–0.5)
Eosinophils Relative: 0 %
HCT: 45 % (ref 36.0–46.0)
Hemoglobin: 15.5 g/dL — ABNORMAL HIGH (ref 12.0–15.0)
Immature Granulocytes: 1 %
Lymphocytes Relative: 21 %
Lymphs Abs: 3.6 10*3/uL (ref 0.7–4.0)
MCH: 30.5 pg (ref 26.0–34.0)
MCHC: 34.4 g/dL (ref 30.0–36.0)
MCV: 88.6 fL (ref 80.0–100.0)
Monocytes Absolute: 1 10*3/uL (ref 0.1–1.0)
Monocytes Relative: 6 %
Neutro Abs: 12.3 10*3/uL — ABNORMAL HIGH (ref 1.7–7.7)
Neutrophils Relative %: 72 %
Platelets: 354 10*3/uL (ref 150–400)
RBC: 5.08 MIL/uL (ref 3.87–5.11)
RDW: 13.8 % (ref 11.5–15.5)
WBC: 17 10*3/uL — ABNORMAL HIGH (ref 4.0–10.5)
nRBC: 0 % (ref 0.0–0.2)

## 2023-10-11 LAB — BASIC METABOLIC PANEL
Anion gap: 20 — ABNORMAL HIGH (ref 5–15)
BUN: 18 mg/dL (ref 6–20)
CO2: 19 mmol/L — ABNORMAL LOW (ref 22–32)
Calcium: 9.4 mg/dL (ref 8.9–10.3)
Chloride: 93 mmol/L — ABNORMAL LOW (ref 98–111)
Creatinine, Ser: 1.07 mg/dL — ABNORMAL HIGH (ref 0.44–1.00)
GFR, Estimated: >60 mL/min (ref >60)
Glucose, Bld: 74 mg/dL (ref 70–99)
Potassium: 4.4 mmol/L (ref 3.5–5.1)
Sodium: 132 mmol/L — ABNORMAL LOW (ref 135–145)

## 2023-10-11 LAB — LIPASE, BLOOD: Lipase: 21 U/L (ref 11–51)

## 2023-10-11 LAB — HCG, SERUM, QUALITATIVE: Preg, Serum: NEGATIVE

## 2023-10-11 MED ORDER — MORPHINE SULFATE (PF) 2 MG/ML IV SOLN
2.0000 mg | Freq: Once | INTRAVENOUS | Status: AC
  Administered 2023-10-11: 2 mg via INTRAVENOUS
  Filled 2023-10-11: qty 1

## 2023-10-11 MED ORDER — SODIUM CHLORIDE 0.9 % IV BOLUS
1000.0000 mL | Freq: Once | INTRAVENOUS | Status: AC
  Administered 2023-10-11: 1000 mL via INTRAVENOUS

## 2023-10-11 MED ORDER — ONDANSETRON HCL 4 MG/2ML IJ SOLN
4.0000 mg | Freq: Once | INTRAMUSCULAR | Status: AC
  Administered 2023-10-11: 4 mg via INTRAVENOUS
  Filled 2023-10-11: qty 2

## 2023-10-11 MED ORDER — IOHEXOL 300 MG/ML  SOLN
100.0000 mL | Freq: Once | INTRAMUSCULAR | Status: AC | PRN
  Administered 2023-10-11: 80 mL via INTRAVENOUS

## 2023-10-11 NOTE — ED Triage Notes (Signed)
 Pt comes via [facility] for bilateral flank pain that started yesterday with nausea and vomiting, denies dysuria

## 2023-10-11 NOTE — ED Provider Notes (Signed)
 [place] EMERGENCY DEPARTMENT AT [facility] Provider Note   CSN: [id number] Arrival date & time: 10/11/23  2141     History {Add pertinent medical, surgical, social history, OB history to HPI:1} Chief Complaint  Patient presents with   Flank Pain    Sabrina Sutton is a 28 y.o. female.  Patient with past medical history significant for type 1 DM, gallstones, pyelonephritis presents to the emergency department via EMS complaining of right sided flank and abdominal pain which began last night.  She endorses nausea with multiple episodes of vomiting since onset.  She denies dysuria but states she has had very little urinary output due to not being able to drink any water over the past day.  Pain is described as severe and sharp.  The pain is constant.  She denies shortness of breath, chest pain, known fevers.  She states she is an occasional alcohol user and an occasional marijuana smoker but has had neither in over a week.   Flank Pain       Home Medications Prior to Admission medications   Medication Sig Start Date End Date Taking? Authorizing Provider  Continuous Glucose Sensor (DEXCOM G6 SENSOR) MISC Please use with your dexcom transmitter 11/17/22   [name], DO  Continuous Glucose Transmitter (DEXCOM G6 TRANSMITTER) MISC INJECT 1 DEVICE UNDER THE SKIN AS DIRECTED UP TO 8 TIMES WITH [facility] NEW SENSOR 11/17/22   [name], DO  glucose blood test strip Use as instructed 12/05/22   Valetta Close, MD  Insulin Pen Needle 32G X 4 MM MISC Use to inject insulin daily 11/17/22   [name], DO  LANTUS SOLOSTAR 100 UNIT/ML Solostar Pen ADMINISTER 25 UNITS UNDER THE SKIN DAILY 12/11/22   Valetta Close, MD  NOVOLOG FLEXPEN 100 UNIT/ML FlexPen INJECT 4 TO 10 UNITS UNDER THE SKIN THREE TIMES DAILY WITH MEALS IF GLUCOSE IS ABOVE 60 AND YOU ARE EATING 08/18/23   Glendale Chard, DO      Allergies    Patient has no known allergies.    Review of Systems    Review of Systems  Genitourinary:  Positive for flank pain.    Physical Exam Updated Vital Signs BP 118/77   Pulse (!) 116   Temp 98.5 F (36.9 C) (Oral)   Resp (!) 22   SpO2 100%  Physical Exam  ED Results / Procedures / Treatments   Labs (all labs ordered are listed, but only abnormal results are displayed) Labs Reviewed  BASIC METABOLIC PANEL - Abnormal; Notable for the following components:      Result Value   Sodium 132 (*)    Chloride 93 (*)    CO2 19 (*)    Creatinine, Ser 1.07 (*)    Anion gap 20 (*)    All other components within normal limits  CBC WITH DIFFERENTIAL/PLATELET - Abnormal; Notable for the following components:   WBC 17.0 (*)    Hemoglobin 15.5 (*)    Neutro Abs 12.3 (*)    Abs Immature Granulocytes 0.09 (*)    All other components within normal limits  HCG, SERUM, QUALITATIVE  LIPASE, BLOOD  URINALYSIS, ROUTINE W REFLEX MICROSCOPIC    EKG None  Radiology US Abdomen Limited RUQ (LIVER/GB) Result Date: 10/11/2023 CLINICAL DATA:  Right upper quadrant pain EXAM: ULTRASOUND ABDOMEN LIMITED RIGHT UPPER QUADRANT COMPARISON:  MRI abdomen 09/03/2022 FINDINGS: Gallbladder: No gallstones or wall thickening visualized. No sonographic Murphy sign noted by sonographer however this has decreased sensitivity at  due to analgesic administration. Common bile duct: Diameter: 3 mm.  No intrahepatic biliary dilation. Liver: No focal lesion identified. Within normal limits in parenchymal echogenicity. Portal vein is patent on color Doppler imaging with normal direction of blood flow towards the liver. Other: None. IMPRESSION: Unremarkable right upper quadrant ultrasound. Electronically Signed   By: Minerva Fester M.D.   On: 10/11/2023 23:05    Procedures Procedures  {Document cardiac monitor, telemetry assessment procedure when appropriate:1}  Medications Ordered in ED Medications  iohexol (OMNIPAQUE) 300 MG/ML solution 100 mL (has no administration in time  range)  morphine (PF) 2 MG/ML injection 2 mg (2 mg Intravenous Given 10/11/23 2240)  ondansetron (ZOFRAN) injection 4 mg (4 mg Intravenous Given 10/11/23 2240)  sodium chloride 0.9 % bolus 1,000 mL (1,000 mLs Intravenous New Bag/Given 10/11/23 2246)    ED Course/ Medical Decision Making/ A&P   {   Click here for ABCD2, HEART and other calculatorsREFRESH Note before signing :1}                              Medical Decision Making Amount and/or Complexity of Data Reviewed Labs: ordered. Radiology: ordered.  Risk Prescription drug management.   ***  {Document critical care time when appropriate:1} {Document review of labs and clinical decision tools ie heart score, Chads2Vasc2 etc:1}  {Document your independent review of radiology images, and any outside records:1} {Document your discussion with family members, caretakers, and with consultants:1} {Document social determinants of health affecting pt's care:1} {Document your decision making why or why not admission, treatments were needed:1} Final Clinical Impression(s) / ED Diagnoses Final diagnoses:  None    Rx / DC Orders ED Discharge Orders     None

## 2023-10-11 NOTE — ED Notes (Signed)
 [place] at the bedside

## 2023-10-11 NOTE — ED Notes (Signed)
 Patient transported to CT

## 2023-10-12 ENCOUNTER — Other Ambulatory Visit: Payer: Self-pay

## 2023-10-12 ENCOUNTER — Emergency Department (HOSPITAL_COMMUNITY)

## 2023-10-12 DIAGNOSIS — Z820 Family history of epilepsy and other diseases of the nervous system: Secondary | ICD-10-CM | POA: Diagnosis not present

## 2023-10-12 DIAGNOSIS — E1043 Type 1 diabetes mellitus with diabetic autonomic (poly)neuropathy: Secondary | ICD-10-CM | POA: Diagnosis not present

## 2023-10-12 DIAGNOSIS — E10649 Type 1 diabetes mellitus with hypoglycemia without coma: Secondary | ICD-10-CM | POA: Diagnosis not present

## 2023-10-12 DIAGNOSIS — Z1152 Encounter for screening for COVID-19: Secondary | ICD-10-CM | POA: Diagnosis not present

## 2023-10-12 DIAGNOSIS — Z87891 Personal history of nicotine dependence: Secondary | ICD-10-CM | POA: Diagnosis not present

## 2023-10-12 DIAGNOSIS — E860 Dehydration: Secondary | ICD-10-CM | POA: Diagnosis not present

## 2023-10-12 DIAGNOSIS — Z83438 Family history of other disorder of lipoprotein metabolism and other lipidemia: Secondary | ICD-10-CM | POA: Diagnosis not present

## 2023-10-12 DIAGNOSIS — F1291 Cannabis use, unspecified, in remission: Secondary | ICD-10-CM | POA: Diagnosis not present

## 2023-10-12 DIAGNOSIS — R112 Nausea with vomiting, unspecified: Secondary | ICD-10-CM | POA: Diagnosis present

## 2023-10-12 DIAGNOSIS — R109 Unspecified abdominal pain: Secondary | ICD-10-CM | POA: Diagnosis not present

## 2023-10-12 DIAGNOSIS — Z833 Family history of diabetes mellitus: Secondary | ICD-10-CM | POA: Diagnosis not present

## 2023-10-12 DIAGNOSIS — Z823 Family history of stroke: Secondary | ICD-10-CM | POA: Diagnosis not present

## 2023-10-12 DIAGNOSIS — K3184 Gastroparesis: Secondary | ICD-10-CM | POA: Diagnosis not present

## 2023-10-12 DIAGNOSIS — Z794 Long term (current) use of insulin: Secondary | ICD-10-CM | POA: Diagnosis not present

## 2023-10-12 DIAGNOSIS — R000 Tachycardia, unspecified: Secondary | ICD-10-CM | POA: Diagnosis not present

## 2023-10-12 DIAGNOSIS — I517 Cardiomegaly: Secondary | ICD-10-CM | POA: Diagnosis not present

## 2023-10-12 DIAGNOSIS — Z8249 Family history of ischemic heart disease and other diseases of the circulatory system: Secondary | ICD-10-CM | POA: Diagnosis not present

## 2023-10-12 DIAGNOSIS — Z91419 Personal history of unspecified adult abuse: Secondary | ICD-10-CM | POA: Diagnosis not present

## 2023-10-12 DIAGNOSIS — R1011 Right upper quadrant pain: Secondary | ICD-10-CM | POA: Diagnosis not present

## 2023-10-12 DIAGNOSIS — Z841 Family history of disorders of kidney and ureter: Secondary | ICD-10-CM | POA: Diagnosis not present

## 2023-10-12 LAB — URINALYSIS, ROUTINE W REFLEX MICROSCOPIC
Bilirubin Urine: NEGATIVE
Glucose, UA: 150 mg/dL — AB
Ketones, ur: 80 mg/dL — AB
Leukocytes,Ua: NEGATIVE
Nitrite: NEGATIVE
Protein, ur: 30 mg/dL — AB
Specific Gravity, Urine: >1.046 — ABNORMAL HIGH (ref 1.005–1.030)
pH: 5 (ref 5.0–8.0)

## 2023-10-12 LAB — RAPID URINE DRUG SCREEN, HOSP PERFORMED
Amphetamines: NOT DETECTED
Barbiturates: NOT DETECTED
Benzodiazepines: NOT DETECTED
Cocaine: NOT DETECTED
Opiates: POSITIVE — AB
Tetrahydrocannabinol: POSITIVE — AB

## 2023-10-12 LAB — COMPREHENSIVE METABOLIC PANEL
ALT: 31 U/L (ref 0–44)
AST: 30 U/L (ref 15–41)
Albumin: 3.2 g/dL — ABNORMAL LOW (ref 3.5–5.0)
Alkaline Phosphatase: 89 U/L (ref 38–126)
Anion gap: 23 — ABNORMAL HIGH (ref 5–15)
BUN: 17 mg/dL (ref 6–20)
CO2: 9 mmol/L — ABNORMAL LOW (ref 22–32)
Calcium: 8 mg/dL — ABNORMAL LOW (ref 8.9–10.3)
Chloride: 93 mmol/L — ABNORMAL LOW (ref 98–111)
Creatinine, Ser: 1.26 mg/dL — ABNORMAL HIGH (ref 0.44–1.00)
GFR, Estimated: >60 mL/min (ref >60)
Glucose, Bld: 452 mg/dL — ABNORMAL HIGH (ref 70–99)
Potassium: 4.7 mmol/L (ref 3.5–5.1)
Sodium: 125 mmol/L — ABNORMAL LOW (ref 135–145)
Total Bilirubin: 2.3 mg/dL — ABNORMAL HIGH (ref 0.0–1.2)
Total Protein: 5.9 g/dL — ABNORMAL LOW (ref 6.5–8.1)

## 2023-10-12 LAB — HEMOGLOBIN A1C
Hgb A1c MFr Bld: 8.9 % — ABNORMAL HIGH (ref 4.8–5.6)
Mean Plasma Glucose: 208.73 mg/dL

## 2023-10-12 LAB — BASIC METABOLIC PANEL
Anion gap: 8 (ref 5–15)
BUN: 15 mg/dL (ref 6–20)
CO2: 19 mmol/L — ABNORMAL LOW (ref 22–32)
Calcium: 8.1 mg/dL — ABNORMAL LOW (ref 8.9–10.3)
Chloride: 101 mmol/L (ref 98–111)
Creatinine, Ser: 0.91 mg/dL (ref 0.44–1.00)
GFR, Estimated: >60 mL/min (ref >60)
Glucose, Bld: 138 mg/dL — ABNORMAL HIGH (ref 70–99)
Potassium: 3.7 mmol/L (ref 3.5–5.1)
Sodium: 128 mmol/L — ABNORMAL LOW (ref 135–145)

## 2023-10-12 LAB — RESP PANEL BY RT-PCR (RSV, FLU A&B, COVID)  RVPGX2
Influenza A by PCR: NEGATIVE
Influenza B by PCR: NEGATIVE
Resp Syncytial Virus by PCR: NEGATIVE
SARS Coronavirus 2 by RT PCR: NEGATIVE

## 2023-10-12 LAB — LACTIC ACID, PLASMA
Lactic Acid, Venous: 2.1 mmol/L (ref 0.5–1.9)
Lactic Acid, Venous: 2.5 mmol/L (ref 0.5–1.9)

## 2023-10-12 LAB — CBG MONITORING, ED
Glucose-Capillary: 425 mg/dL — ABNORMAL HIGH (ref 70–99)
Glucose-Capillary: 239 mg/dL — ABNORMAL HIGH (ref 70–99)
Glucose-Capillary: 296 mg/dL — ABNORMAL HIGH (ref 70–99)
Glucose-Capillary: 175 mg/dL — ABNORMAL HIGH (ref 70–99)
Glucose-Capillary: 141 mg/dL — ABNORMAL HIGH (ref 70–99)

## 2023-10-12 LAB — TSH: TSH: 0.401 u[IU]/mL (ref 0.350–4.500)

## 2023-10-12 LAB — MAGNESIUM: Magnesium: 2 mg/dL (ref 1.7–2.4)

## 2023-10-12 LAB — GLUCOSE, CAPILLARY: Glucose-Capillary: 193 mg/dL — ABNORMAL HIGH (ref 70–99)

## 2023-10-12 LAB — HIV ANTIBODY (ROUTINE TESTING W REFLEX): HIV Screen 4th Generation wRfx: NONREACTIVE

## 2023-10-12 LAB — I-STAT CG4 LACTIC ACID, ED: Lactic Acid, Venous: 1.6 mmol/L (ref 0.5–1.9)

## 2023-10-12 MED ORDER — INSULIN ASPART 100 UNIT/ML IJ SOLN
0.0000 [IU] | Freq: Three times a day (TID) | INTRAMUSCULAR | Status: DC
  Administered 2023-10-12: 3 [IU] via SUBCUTANEOUS
  Administered 2023-10-12: 15 [IU] via SUBCUTANEOUS
  Administered 2023-10-13: 2 [IU] via SUBCUTANEOUS
  Administered 2023-10-13: 11 [IU] via SUBCUTANEOUS
  Administered 2023-10-14: 8 [IU] via SUBCUTANEOUS
  Filled 2023-10-12: qty 0.15

## 2023-10-12 MED ORDER — ACETAMINOPHEN 325 MG PO TABS
650.0000 mg | ORAL_TABLET | Freq: Four times a day (QID) | ORAL | Status: DC | PRN
  Administered 2023-10-12: 650 mg via ORAL
  Filled 2023-10-12: qty 2

## 2023-10-12 MED ORDER — INSULIN ASPART 100 UNIT/ML IJ SOLN
5.0000 [IU] | Freq: Three times a day (TID) | INTRAMUSCULAR | Status: DC
  Administered 2023-10-12: 5 [IU] via SUBCUTANEOUS
  Filled 2023-10-12: qty 0.05

## 2023-10-12 MED ORDER — LACTATED RINGERS IV SOLN: INTRAVENOUS | Status: AC

## 2023-10-12 MED ORDER — MORPHINE SULFATE (PF) 2 MG/ML IV SOLN: 2.0000 mg | Freq: Once | INTRAVENOUS | Status: DC

## 2023-10-12 MED ORDER — LORAZEPAM 1 MG PO TABS
1.0000 mg | ORAL_TABLET | Freq: Four times a day (QID) | ORAL | Status: DC | PRN
  Administered 2023-10-12: 1 mg via ORAL
  Filled 2023-10-12: qty 1

## 2023-10-12 MED ORDER — ONDANSETRON HCL 4 MG/2ML IJ SOLN: 4.0000 mg | Freq: Four times a day (QID) | INTRAMUSCULAR | Status: DC | PRN

## 2023-10-12 MED ORDER — LACTATED RINGERS IV BOLUS
1000.0000 mL | Freq: Once | INTRAVENOUS | Status: AC
  Administered 2023-10-12: 1000 mL via INTRAVENOUS

## 2023-10-12 MED ORDER — ENOXAPARIN SODIUM 40 MG/0.4ML IJ SOSY
40.0000 mg | PREFILLED_SYRINGE | INTRAMUSCULAR | Status: DC
  Administered 2023-10-12 – 2023-10-14 (×2): 40 mg via SUBCUTANEOUS
  Filled 2023-10-12 (×2): qty 0.4

## 2023-10-12 MED ORDER — LORAZEPAM 2 MG/ML IJ SOLN: 0.5000 mg | Freq: Once | INTRAMUSCULAR | Status: DC

## 2023-10-12 MED ORDER — SODIUM CHLORIDE 0.9 % IV BOLUS
1000.0000 mL | Freq: Once | INTRAVENOUS | Status: AC
  Administered 2023-10-12: 1000 mL via INTRAVENOUS

## 2023-10-12 MED ORDER — ONDANSETRON HCL 4 MG PO TABS
4.0000 mg | ORAL_TABLET | Freq: Four times a day (QID) | ORAL | Status: DC | PRN
  Administered 2023-10-12: 4 mg via ORAL
  Filled 2023-10-12: qty 1

## 2023-10-12 MED ORDER — ACETAMINOPHEN 650 MG RE SUPP: 650.0000 mg | Freq: Four times a day (QID) | RECTAL | Status: DC | PRN

## 2023-10-12 MED ORDER — OXYCODONE HCL 5 MG PO TABS
5.0000 mg | ORAL_TABLET | ORAL | Status: DC | PRN
  Administered 2023-10-12: 5 mg via ORAL
  Filled 2023-10-12: qty 1

## 2023-10-12 MED ORDER — ONDANSETRON HCL 4 MG/2ML IJ SOLN
4.0000 mg | Freq: Four times a day (QID) | INTRAMUSCULAR | Status: DC | PRN
  Administered 2023-10-12 – 2023-10-13 (×2): 4 mg via INTRAVENOUS
  Filled 2023-10-12 (×2): qty 2

## 2023-10-12 MED ORDER — INSULIN GLARGINE 100 UNIT/ML ~~LOC~~ SOLN
25.0000 [IU] | Freq: Every day | SUBCUTANEOUS | Status: DC
  Administered 2023-10-12 – 2023-10-14 (×3): 25 [IU] via SUBCUTANEOUS
  Filled 2023-10-12 (×3): qty 0.25

## 2023-10-12 MED ORDER — INSULIN ASPART 100 UNIT/ML IJ SOLN
0.0000 [IU] | Freq: Every day | INTRAMUSCULAR | Status: DC
  Filled 2023-10-12: qty 0.05

## 2023-10-12 MED ORDER — ALBUTEROL SULFATE (2.5 MG/3ML) 0.083% IN NEBU: 2.5000 mg | INHALATION_SOLUTION | RESPIRATORY_TRACT | Status: DC | PRN

## 2023-10-12 MED ORDER — TRAZODONE HCL 50 MG PO TABS
25.0000 mg | ORAL_TABLET | Freq: Every evening | ORAL | Status: DC | PRN
  Administered 2023-10-12: 25 mg via ORAL
  Filled 2023-10-12: qty 1

## 2023-10-12 MED ORDER — DROPERIDOL 2.5 MG/ML IJ SOLN
1.2500 mg | Freq: Once | INTRAMUSCULAR | Status: AC
  Administered 2023-10-12: 1.25 mg via INTRAVENOUS
  Filled 2023-10-12: qty 2

## 2023-10-12 NOTE — Plan of Care (Signed)
  Problem: Nutritional: Goal: Maintenance of adequate nutrition will improve Outcome: Progressing   Problem: Pain Managment: Goal: General experience of comfort will improve and/or be controlled Outcome: Progressing   Problem: Safety: Goal: Ability to remain free from injury will improve Outcome: Progressing

## 2023-10-12 NOTE — Inpatient Diabetes Management (Signed)
 Inpatient Diabetes Program Recommendations  AACE/ADA: New Consensus Statement on Inpatient Glycemic Control (2015)  Target Ranges:  Prepandial:   less than 140 mg/dL      Peak postprandial:   less than 180 mg/dL (1-2 hours)      Critically ill patients:  140 - 180 mg/dL   Lab Results  Component Value Date   GLUCAP 141 (H) 10/12/2023   HGBA1C 8.9 (H) 10/12/2023    Review of Glycemic Control  Diabetes history: DM1 Outpatient Diabetes medications: Lantus 25 daily, Novolog 4-10 units TID and you are eating Current orders for Inpatient glycemic control: Lantus 25 daily, Novolog 0-15 TID with meals and 0-5 HS + 5 units TID  HgbA1C - 8.9% CO2 - 9 AG - 23 Urine Ketones - 80   Inpatient Diabetes Program Recommendations:    Awaiting BMET Please order BHB  Concern for DKA since pt has hx DKA and Type 1 DM  Consider decreasing Novolog to 0-9 TID with meals and 0-5 HS (Type 1 and sensitive to insulin)  Will follow.  Thank you. Ailene Ards, RD, LDN, CDCES Inpatient Diabetes Coordinator [phone]
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
## 2023-10-12 NOTE — ED Provider Notes (Signed)
 ED ECG REPORT   Date: 10/12/2023  Rate: 112  Rhythm: sinus tachycardia  QRS Axis: normal  Intervals: normal  ST/T Wave abnormalities: nonspecific ST changes  Conduction Disutrbances:none  Narrative Interpretation: Sinus tachycardia, right atrial enlargement, nonspecific ST changes.  When compared with ECG of 11/12/2022, heart rate has increased, nonspecific ST changes are now present.  Old EKG Reviewed: changes noted  I have personally reviewed the EKG tracing and agree with the computerized printout as noted.    Dione Booze, MD 10/12/23 [id number]

## 2023-10-13 DIAGNOSIS — R112 Nausea with vomiting, unspecified: Secondary | ICD-10-CM | POA: Diagnosis not present

## 2023-10-13 DIAGNOSIS — R000 Tachycardia, unspecified: Secondary | ICD-10-CM

## 2023-10-13 DIAGNOSIS — R109 Unspecified abdominal pain: Secondary | ICD-10-CM | POA: Diagnosis not present

## 2023-10-13 LAB — GLUCOSE, CAPILLARY
Glucose-Capillary: 26 mg/dL — CL (ref 70–99)
Glucose-Capillary: 111 mg/dL — ABNORMAL HIGH (ref 70–99)
Glucose-Capillary: 42 mg/dL — CL (ref 70–99)
Glucose-Capillary: 341 mg/dL — ABNORMAL HIGH (ref 70–99)
Glucose-Capillary: 146 mg/dL — ABNORMAL HIGH (ref 70–99)
Glucose-Capillary: 226 mg/dL — ABNORMAL HIGH (ref 70–99)
Glucose-Capillary: 157 mg/dL — ABNORMAL HIGH (ref 70–99)

## 2023-10-13 LAB — BASIC METABOLIC PANEL
Anion gap: 11 (ref 5–15)
BUN: 9 mg/dL (ref 6–20)
CO2: 20 mmol/L — ABNORMAL LOW (ref 22–32)
Calcium: 8.4 mg/dL — ABNORMAL LOW (ref 8.9–10.3)
Chloride: 99 mmol/L (ref 98–111)
Creatinine, Ser: 0.8 mg/dL (ref 0.44–1.00)
GFR, Estimated: >60 mL/min (ref >60)
Glucose, Bld: 260 mg/dL — ABNORMAL HIGH (ref 70–99)
Potassium: 3.8 mmol/L (ref 3.5–5.1)
Sodium: 130 mmol/L — ABNORMAL LOW (ref 135–145)

## 2023-10-13 LAB — CBC
HCT: 34.9 % — ABNORMAL LOW (ref 36.0–46.0)
Hemoglobin: 12.3 g/dL (ref 12.0–15.0)
MCH: 30.4 pg (ref 26.0–34.0)
MCHC: 35.2 g/dL (ref 30.0–36.0)
MCV: 86.4 fL (ref 80.0–100.0)
Platelets: 293 10*3/uL (ref 150–400)
RBC: 4.04 MIL/uL (ref 3.87–5.11)
RDW: 13.5 % (ref 11.5–15.5)
WBC: 9 10*3/uL (ref 4.0–10.5)
nRBC: 0 % (ref 0.0–0.2)

## 2023-10-13 MED ORDER — GLUCOSE 40 % PO GEL
1.0000 | Freq: Once | ORAL | Status: AC
  Administered 2023-10-13: 31 g via ORAL
  Filled 2023-10-13: qty 1.21

## 2023-10-13 NOTE — Progress Notes (Signed)
 [facility]  PROGRESS NOTE  Sabrina Sutton WJX:[id number] DOB: [birth year] DOA: 10/11/2023 PCP: Glendale Chard, DO   Brief HPI:    28 y.o. female with medical history significant for type 1 diabetes, cannabis hyperemesis syndrome, depression being admitted to the hospital with intractable vomiting.  Patient states she started having abdominal cramping and vomiting about 3 days ago, she denies fevers, chest pain, chills, blood in her stool or emesis.  She has abdominal cramping in her bilateral flanks.  No sick contacts.  She was worked up in the emergency department extensively as noted below, with no acute findings.  She has been tachycardic 110-130 4 most of the night, EKG has shown sinus tachycardia.  This morning just as I was coming to see the patient, her heart rate jumped up to the 150s.  She continues to deny dizziness, chest pain, nausea or other complaints at this moment.  Sister states that she feels a little bit tired, and wonders if she can drink some more water.     Assessment/Plan:   Intractable nausea and vomiting -Likely in setting of diabetic gastroparesis versus recurrent hyperemesis cannabinoid syndrome -Improving, will advance to full liquid diet and advance as tolerated -Continue Zofran as needed  Sinus tachycardia -Likely due to dehydration -Improved -TSH 0.  401  Diabetes mellitus type 1 -Hemoglobin A1c 8.9 -Continue Lantus 25 units subcu daily -Continue sliding scale insulin with NovoLog -Has been having episodes of hypoglycemia, will discontinue 5 units 3 times daily meal coverage -Diet advanced to soft diet     Medications     enoxaparin (LOVENOX) injection  40 mg Subcutaneous Q24H   insulin aspart  0-15 Units Subcutaneous TID WC   insulin aspart  0-5 Units Subcutaneous QHS   insulin aspart  5 Units Subcutaneous TID WC   insulin glargine  25 Units Subcutaneous Daily     Data Reviewed:   CBG:  Recent Labs  Lab 10/12/23 1625  10/12/23 2105 10/13/23 0628 10/13/23 0646 10/13/23 0718  GLUCAP 141* 193* 26* 42* 111*    SpO2: 100 %    Vitals:   10/12/23 1955 10/12/23 2352 10/13/23 0423 10/13/23 0802  BP:  (!) 146/90 120/70 (!) 151/86  Pulse:  (!) 101 82 91  Resp:  15 18   Temp:  98.3 F (36.8 C) 98.4 F (36.9 C) 99 F (37.2 C)  TempSrc:  Oral Oral   SpO2:  100% 100% 100%  Weight: 54.5 kg     Height: 5\' 7"  (1.702 m)         Data Reviewed:  Basic Metabolic Panel: Recent Labs  Lab 10/11/23 2227 10/12/23 0804 10/12/23 1636  NA 132* 125* 128*  K 4.4 4.7 3.7  CL 93* 93* 101  CO2 19* 9* 19*  GLUCOSE 74 452* 138*  BUN 18 17 15   CREATININE 1.07* 1.26* 0.91  CALCIUM 9.4 8.0* 8.1*  MG  --  2.0  --     CBC: Recent Labs  Lab 10/11/23 2227  WBC 17.0*  NEUTROABS 12.3*  HGB 15.5*  HCT 45.0  MCV 88.6  PLT 354    LFT Recent Labs  Lab 10/12/23 0804  AST 30  ALT 31  ALKPHOS 89  BILITOT 2.3*  PROT 5.9*  ALBUMIN 3.2*     Antibiotics: Anti-infectives (From admission, onward)    None        DVT prophylaxis: Lovenox  Code Status: Full code  Family Communication: No family at bedside   CONSULTS none  Subjective   Feels better today.  Wants to try liquid diet   Objective    Physical Examination:   General-appears in no acute distress Heart-S1-S2, regular, no murmur auscultated Lungs-clear to auscultation bilaterally, no wheezing or crackles auscultated Abdomen-soft, nontender, no organomegaly Extremities-no edema in the lower extremities Neuro-alert, oriented x3, no focal deficit noted  Status is: Inpatient:             Meredeth Ide   [facility] If 7PM-7AM, please contact night-coverage at [email or web address], Office  [phone]   10/13/2023, 8:47 AM  LOS: 1 day

## 2023-10-13 NOTE — Plan of Care (Signed)

## 2023-10-14 DIAGNOSIS — R112 Nausea with vomiting, unspecified: Secondary | ICD-10-CM | POA: Diagnosis not present

## 2023-10-14 LAB — CBC
HCT: 36.8 % (ref 36.0–46.0)
Hemoglobin: 12.3 g/dL (ref 12.0–15.0)
MCH: 30.2 pg (ref 26.0–34.0)
MCHC: 33.4 g/dL (ref 30.0–36.0)
MCV: 90.4 fL (ref 80.0–100.0)
Platelets: 278 10*3/uL (ref 150–400)
RBC: 4.07 MIL/uL (ref 3.87–5.11)
RDW: 13.4 % (ref 11.5–15.5)
WBC: 8.7 10*3/uL (ref 4.0–10.5)
nRBC: 0 % (ref 0.0–0.2)

## 2023-10-14 LAB — BASIC METABOLIC PANEL
Anion gap: 9 (ref 5–15)
BUN: 10 mg/dL (ref 6–20)
CO2: 23 mmol/L (ref 22–32)
Calcium: 8.6 mg/dL — ABNORMAL LOW (ref 8.9–10.3)
Chloride: 99 mmol/L (ref 98–111)
Creatinine, Ser: 0.77 mg/dL (ref 0.44–1.00)
GFR, Estimated: >60 mL/min (ref >60)
Glucose, Bld: 299 mg/dL — ABNORMAL HIGH (ref 70–99)
Potassium: 3.7 mmol/L (ref 3.5–5.1)
Sodium: 131 mmol/L — ABNORMAL LOW (ref 135–145)

## 2023-10-14 LAB — GLUCOSE, CAPILLARY: Glucose-Capillary: 279 mg/dL — ABNORMAL HIGH (ref 70–99)

## 2023-10-14 MED ORDER — ONDANSETRON HCL 4 MG PO TABS: 4.0000 mg | ORAL_TABLET | Freq: Four times a day (QID) | ORAL | 0 refills | Status: DC | PRN

## 2023-10-14 MED ORDER — INSULIN GLARGINE 100 UNIT/ML ~~LOC~~ SOLN: 25.0000 [IU] | Freq: Every day | SUBCUTANEOUS | 11 refills | Status: DC

## 2023-10-14 MED ORDER — OXYCODONE HCL 5 MG PO TABS: 5.0000 mg | ORAL_TABLET | Freq: Four times a day (QID) | ORAL | 0 refills | Status: AC | PRN

## 2023-10-14 NOTE — Plan of Care (Signed)
  Problem: Nutrition: Goal: Adequate nutrition will be maintained Outcome: Progressing   Problem: Elimination: Goal: Will not experience complications related to bowel motility Outcome: Progressing   Problem: Pain Managment: Goal: General experience of comfort will improve and/or be controlled Outcome: Progressing

## 2023-10-14 NOTE — Discharge Summary (Signed)
 Physician Discharge Summary   Patient: Sabrina Sutton MRN: [id number] DOB: Dec 02, 1995  Admit date:     10/11/2023  Discharge date: 10/14/23  Discharge Physician: Lilia Pro   PCP: Glendale Chard, DO   Recommendations at discharge:   Compliance with Diabetes medications Abstinence of Cannabinoids   Discharge Diagnoses: Principal Problem:   Intractable nausea and vomiting Diabetic Gastroparesis  Hospital Course: 28 y.o. female with medical history significant for type 1 diabetes, cannabis hyperemesis syndrome, depression admitted to the hospital with intractable nausea and vomiting.  Patient states she started having abdominal cramping and vomiting about 3 days prior to her presentation. She denies fevers, chest pain, chills, blood in her stool or emesis.  She has abdominal cramping in her bilateral flanks.  No sick contacts.  She was worked up in the emergency department with no acute findings.  She was noted to be tachycardic 110-130's. EKG has shown sinus tachycardia and attributed likely to dehydration and pain.  Assessment and Plan:  Intractable nausea and vomiting-Likely in setting of diabetic gastroparesis versus recurrent hyperemesis cannabinoid syndrome.Symptoms have resolved with treatment. Patient feels well and wishes to be discharged. Diet was carefully advanced without any adverse sequelae. Zofran as needed was prescribed   Sinus tachycardia-Resolved and Likely due to dehydration.TSH 0.  401 Pulse rate this morning was 79/min   Diabetes mellitus type 1-Hemoglobin A1c 8.9.Continue Lantus 25 units subcu daily and sliding scale insulin with NovoLog per home regimen.Had episodes of hypoglycemia with 5 units 3 times daily meal coverage, hence this was discontinued    Consultants: None Procedures performed: None  Disposition: Home Diet recommendation:  Discharge Diet Orders (From admission, onward)     Start     Ordered   10/14/23 0000  Diet Carb Modified         10/14/23 1052           Carb modified diet DISCHARGE MEDICATION: Allergies as of 10/14/2023   No Known Allergies      Medication List     STOP taking these medications    Lantus SoloStar 100 UNIT/ML Solostar Pen Generic drug: insulin glargine Replaced by: insulin glargine 100 UNIT/ML injection       TAKE these medications    Dexcom G6 Sensor Misc Please use with your dexcom transmitter   Dexcom G6 Transmitter Misc INJECT 1 DEVICE UNDER THE SKIN AS DIRECTED UP TO 8 TIMES WITH EACH NEW SENSOR   glucose blood test strip Use as instructed   insulin glargine 100 UNIT/ML injection Commonly known as: LANTUS Inject 0.25 mLs (25 Units total) into the skin daily. Start taking on: October 15, 2023 Replaces: Lantus SoloStar 100 UNIT/ML Solostar Pen   NovoLOG FlexPen 100 UNIT/ML FlexPen Generic drug: insulin aspart INJECT 4 TO 10 UNITS UNDER THE SKIN THREE TIMES DAILY WITH MEALS IF GLUCOSE IS ABOVE 60 AND YOU ARE EATING   ondansetron 4 MG tablet Commonly known as: ZOFRAN Take 1 tablet (4 mg total) by mouth every 6 (six) hours as needed for nausea.   oxyCODONE 5 MG immediate release tablet Commonly known as: Oxy IR/ROXICODONE Take 1 tablet (5 mg total) by mouth every 6 (six) hours as needed for up to 5 days for moderate pain (pain score 4-6).   TechLite Pen Needles 32G X 4 MM Misc Generic drug: Insulin Pen Needle Use to inject insulin daily        Discharge Exam: Filed Weights   10/12/23 1955  Weight: 54.5 kg   General-appears in no  acute distress. Not pale , anicteric Heart-S1-S2, regular, no murmur auscultated Lungs-clear to auscultation bilaterally, no wheezing or crackles auscultated Abdomen-soft, nontender, no organomegaly Extremities-no edema in the lower extremities Neuro-alert, oriented x3, no focal deficit noted  Condition at discharge: good  The results of significant diagnostics from this hospitalization (including imaging, microbiology,  ancillary and laboratory) are listed below for reference.   Imaging Studies: CT ABDOMEN PELVIS W CONTRAST Result Date: 10/12/2023 CLINICAL DATA:  Abdominal pain, bilateral flank pain, greater on the right. EXAM: CT ABDOMEN AND PELVIS WITH CONTRAST TECHNIQUE: Multidetector CT imaging of the abdomen and pelvis was performed using the standard protocol following bolus administration of intravenous contrast. RADIATION DOSE REDUCTION: This exam was performed according to the departmental dose-optimization program which includes automated exposure control, adjustment of the mA and/or kV according to patient size and/or use of iterative reconstruction technique. CONTRAST:  80mL OMNIPAQUE IOHEXOL 300 MG/ML  SOLN COMPARISON:  11/23/2021 FINDINGS: Lower chest: No acute findings Hepatobiliary: No focal hepatic abnormality. Gallbladder unremarkable. Pancreas: No focal abnormality or ductal dilatation. Spleen: No focal abnormality.  Normal size. Adrenals/Urinary Tract: No renal or adrenal abnormality. No hydronephrosis. Contrast material within the collecting systems, ureter and bladder. Cannot evaluate for stones. Urinary bladder grossly unremarkable. Stomach/Bowel: Stomach, large and small bowel grossly unremarkable. Appendix not visualized. No inflammatory process in the right lower quadrant. Vascular/Lymphatic: No evidence of aneurysm or adenopathy. Reproductive: Uterus and adnexa unremarkable. No mass. IUD in the uterus. Other: No free fluid or free air. Musculoskeletal: No acute bony abnormality. IMPRESSION: No acute findings in the abdomen or pelvis. Electronically Signed   By: Charlett Nose M.D.   On: 10/12/2023 00:57   DG Chest Port 1 View Result Date: 10/12/2023 CLINICAL DATA:  Bilateral flank pain, nausea, vomiting, questionable sepsis EXAM: PORTABLE CHEST 1 VIEW COMPARISON:  05/03/2018 FINDINGS: The heart size and mediastinal contours are within normal limits. Both lungs are clear. The visualized skeletal  structures are unremarkable. IMPRESSION: No active disease. Electronically Signed   By: Minerva Fester M.D.   On: 10/12/2023 00:17   US Abdomen Limited RUQ (LIVER/GB) Result Date: 10/11/2023 CLINICAL DATA:  Right upper quadrant pain EXAM: ULTRASOUND ABDOMEN LIMITED RIGHT UPPER QUADRANT COMPARISON:  MRI abdomen 09/03/2022 FINDINGS: Gallbladder: No gallstones or wall thickening visualized. No sonographic Murphy sign noted by sonographer however this has decreased sensitivity at due to analgesic administration. Common bile duct: Diameter: 3 mm.  No intrahepatic biliary dilation. Liver: No focal lesion identified. Within normal limits in parenchymal echogenicity. Portal vein is patent on color Doppler imaging with normal direction of blood flow towards the liver. Other: None. IMPRESSION: Unremarkable right upper quadrant ultrasound. Electronically Signed   By: Minerva Fester M.D.   On: 10/11/2023 23:05    Microbiology: Results for orders placed or performed during the hospital encounter of 10/11/23  Blood Culture (routine x 2)     Status: None (Preliminary result)   Collection Time: 10/12/23 12:26 AM   Specimen: BLOOD RIGHT FOREARM  Result Value Ref Range Status   Specimen Description   Final    BLOOD RIGHT FOREARM Performed at [facility], [address], [place], [place] [address]    Special Requests   Final    BOTTLES DRAWN AEROBIC AND ANAEROBIC Blood Culture results may not be optimal due to an inadequate volume of blood received in culture bottles Performed at [facility], [address], [place], [place] [address]    Culture   Final  NO GROWTH 2 DAYS Performed at [facility], [address], [place], [place] [address]    Report Status PENDING  Incomplete  Blood Culture (routine x 2)     Status: None (Preliminary result)   Collection Time: 10/12/23 12:26 AM   Specimen: BLOOD  Result Value Ref Range Status   Specimen Description   Final     BLOOD RIGHT ANTECUBITAL Performed at [facility], [address], [place], [place] [address]    Special Requests   Final    BOTTLES DRAWN AEROBIC AND ANAEROBIC Blood Culture results may not be optimal due to an inadequate volume of blood received in culture bottles Performed at [facility], [address] [place], [place] [address]    Culture   Final    NO GROWTH 2 DAYS Performed at [facility], [address], [place], [place] [address]    Report Status PENDING  Incomplete  Resp panel by RT-PCR (RSV, Flu A&B, Covid) Anterior Nasal Swab     Status: None   Collection Time: 10/12/23  6:00 AM   Specimen: Anterior Nasal Swab  Result Value Ref Range Status   SARS Coronavirus 2 by RT PCR NEGATIVE NEGATIVE Final    Comment: (NOTE) SARS-CoV-2 target nucleic acids are NOT DETECTED.  The SARS-CoV-2 RNA is generally detectable in upper respiratory specimens during the acute phase of infection. The lowest concentration of SARS-CoV-2 viral copies this assay can detect is 138 copies/mL. A negative result does not preclude SARS-Cov-2 infection and should not be used as the sole basis for treatment or other patient management decisions. A negative result may occur with  improper specimen collection/handling, submission of specimen other than nasopharyngeal swab, presence of viral mutation(s) within the areas targeted by this assay, and inadequate number of viral copies(<138 copies/mL). A negative result must be combined with clinical observations, patient history, and epidemiological information. The expected result is Negative.  Fact Sheet for Patients:  BloggerCourse.com  Fact Sheet for Healthcare Providers:  SeriousBroker.it  This test is no t yet approved or cleared by the [place] FDA and  has been authorized for detection and/or diagnosis of SARS-CoV-2 by FDA under an Emergency Use  Authorization (EUA). This EUA will remain  in effect (meaning this test can be used) for the duration of the COVID-19 declaration under Section 564(b)(1) of the Act, 21 U.S.C.section 360bbb-3(b)(1), unless the authorization is terminated  or revoked sooner.       Influenza A by PCR NEGATIVE NEGATIVE Final   Influenza B by PCR NEGATIVE NEGATIVE Final    Comment: (NOTE) The Xpert Xpress SARS-CoV-2/FLU/RSV plus assay is intended as an aid in the diagnosis of influenza from Nasopharyngeal swab specimens and should not be used as a sole basis for treatment. Nasal washings and aspirates are unacceptable for Xpert Xpress SARS-CoV-2/FLU/RSV testing.  Fact Sheet for Patients: BloggerCourse.com  Fact Sheet for Healthcare Providers: SeriousBroker.it  This test is not yet approved or cleared by the [place] FDA and has been authorized for detection and/or diagnosis of SARS-CoV-2 by FDA under an Emergency Use Authorization (EUA). This EUA will remain in effect (meaning this test can be used) for the duration of the COVID-19 declaration under Section 564(b)(1) of the Act, 21 U.S.C. section 360bbb-3(b)(1), unless the authorization is terminated or revoked.     Resp Syncytial Virus by PCR NEGATIVE NEGATIVE Final    Comment: (NOTE) Fact Sheet for Patients: BloggerCourse.com  Fact Sheet for Healthcare  Providers: SeriousBroker.it  This test is not yet approved or cleared by the [place] and has been authorized for detection and/or diagnosis of SARS-CoV-2 by FDA under an Emergency Use Authorization (EUA). This EUA will remain in effect (meaning this test can be used) for the duration of the COVID-19 declaration under Section 564(b)(1) of the Act, 21 U.S.C. section 360bbb-3(b)(1), unless the authorization is terminated or revoked.  Performed at [facility],  [address], [place], [place] [address]     Labs: CBC: Recent Labs  Lab 10/11/23 2227 10/13/23 0845 10/14/23 0620  WBC 17.0* 9.0 8.7  NEUTROABS 12.3*  --   --   HGB 15.5* 12.3 12.3  HCT 45.0 34.9* 36.8  MCV 88.6 86.4 90.4  PLT 354 293 278   Basic Metabolic Panel: Recent Labs  Lab 10/11/23 2227 10/12/23 0804 10/12/23 1636 10/13/23 0845 10/14/23 0620  NA 132* 125* 128* 130* 131*  K 4.4 4.7 3.7 3.8 3.7  CL 93* 93* 101 99 99  CO2 19* 9* 19* 20* 23  GLUCOSE 74 452* 138* 260* 299*  BUN 18 17 15 9 10   CREATININE 1.07* 1.26* 0.91 0.80 0.77  CALCIUM 9.4 8.0* 8.1* 8.4* 8.6*  MG  --  2.0  --   --   --    Liver Function Tests: Recent Labs  Lab 10/12/23 0804  AST 30  ALT 31  ALKPHOS 89  BILITOT 2.3*  PROT 5.9*  ALBUMIN 3.2*   CBG: Recent Labs  Lab 10/13/23 1133 10/13/23 1624 10/13/23 1812 10/13/23 2141 10/14/23 0730  GLUCAP 341* 146* 226* 157* 279*    Discharge time spent: greater than 30 minutes.  Signed: Lilia Pro, MD [facility] 10/14/2023

## 2023-10-14 NOTE — Plan of Care (Signed)
  Problem: Education: Goal: Ability to describe self-care measures that may prevent or decrease complications (Diabetes Survival Skills Education) will improve Outcome: Progressing   Problem: Coping: Goal: Ability to adjust to condition or change in health will improve Outcome: Progressing   Problem: Health Behavior/Discharge Planning: Goal: Ability to identify and utilize available resources and services will improve Outcome: Progressing   Problem: Metabolic: Goal: Ability to maintain appropriate glucose levels will improve Outcome: Progressing   Problem: Nutritional: Goal: Maintenance of adequate nutrition will improve Outcome: Progressing   Problem: Education: Goal: Knowledge of General Education information will improve Description: Including pain rating scale, medication(s)/side effects and non-pharmacologic comfort measures Outcome: Progressing   Problem: Clinical Measurements: Goal: Diagnostic test results will improve Outcome: Progressing

## 2023-10-17 LAB — CULTURE, BLOOD (ROUTINE X 2)
Culture: NO GROWTH
Culture: NO GROWTH

## 2023-11-14 ENCOUNTER — Emergency Department (HOSPITAL_COMMUNITY)
Admission: EM | Admit: 2023-11-14 | Discharge: 2023-11-14 | Disposition: A | Discharge status: Home / Self Care | Attending: Emergency Medicine | Admitting: Emergency Medicine

## 2023-11-14 ENCOUNTER — Encounter (HOSPITAL_COMMUNITY): Payer: Self-pay

## 2023-11-14 ENCOUNTER — Other Ambulatory Visit: Payer: Self-pay

## 2023-11-14 DIAGNOSIS — E1165 Type 2 diabetes mellitus with hyperglycemia: Secondary | ICD-10-CM | POA: Diagnosis not present

## 2023-11-14 DIAGNOSIS — S01111A Laceration without foreign body of right eyelid and periocular area, initial encounter: Secondary | ICD-10-CM | POA: Diagnosis not present

## 2023-11-14 DIAGNOSIS — R58 Hemorrhage, not elsewhere classified: Secondary | ICD-10-CM | POA: Diagnosis not present

## 2023-11-14 DIAGNOSIS — S0181XA Laceration without foreign body of other part of head, initial encounter: Secondary | ICD-10-CM

## 2023-11-14 DIAGNOSIS — Z794 Long term (current) use of insulin: Secondary | ICD-10-CM | POA: Insufficient documentation

## 2023-11-14 DIAGNOSIS — E871 Hypo-osmolality and hyponatremia: Secondary | ICD-10-CM | POA: Diagnosis not present

## 2023-11-14 DIAGNOSIS — R739 Hyperglycemia, unspecified: Secondary | ICD-10-CM

## 2023-11-14 DIAGNOSIS — E878 Other disorders of electrolyte and fluid balance, not elsewhere classified: Secondary | ICD-10-CM | POA: Diagnosis not present

## 2023-11-14 DIAGNOSIS — D72829 Elevated white blood cell count, unspecified: Secondary | ICD-10-CM | POA: Insufficient documentation

## 2023-11-14 DIAGNOSIS — E1065 Type 1 diabetes mellitus with hyperglycemia: Secondary | ICD-10-CM | POA: Insufficient documentation

## 2023-11-14 DIAGNOSIS — R000 Tachycardia, unspecified: Secondary | ICD-10-CM | POA: Diagnosis not present

## 2023-11-14 LAB — BASIC METABOLIC PANEL WITH GFR
Anion gap: 11 (ref 5–15)
BUN: 18 mg/dL (ref 6–20)
CO2: 24 mmol/L (ref 22–32)
Calcium: 10.1 mg/dL (ref 8.9–10.3)
Chloride: 97 mmol/L — ABNORMAL LOW (ref 98–111)
Creatinine, Ser: 0.88 mg/dL (ref 0.44–1.00)
GFR, Estimated: >60 mL/min (ref >60)
Glucose, Bld: 421 mg/dL — ABNORMAL HIGH (ref 70–99)
Potassium: 5.1 mmol/L (ref 3.5–5.1)
Sodium: 132 mmol/L — ABNORMAL LOW (ref 135–145)

## 2023-11-14 LAB — CBC WITH DIFFERENTIAL/PLATELET
Abs Immature Granulocytes: 0.05 10*3/uL (ref 0.00–0.07)
Basophils Absolute: 0.1 10*3/uL (ref 0.0–0.1)
Basophils Relative: 1 %
Eosinophils Absolute: 0 10*3/uL (ref 0.0–0.5)
Eosinophils Relative: 0 %
HCT: 42.6 % (ref 36.0–46.0)
Hemoglobin: 14 g/dL (ref 12.0–15.0)
Immature Granulocytes: 1 %
Lymphocytes Relative: 21 %
Lymphs Abs: 2.3 10*3/uL (ref 0.7–4.0)
MCH: 30 pg (ref 26.0–34.0)
MCHC: 32.9 g/dL (ref 30.0–36.0)
MCV: 91.2 fL (ref 80.0–100.0)
Monocytes Absolute: 0.5 10*3/uL (ref 0.1–1.0)
Monocytes Relative: 5 %
Neutro Abs: 8 10*3/uL — ABNORMAL HIGH (ref 1.7–7.7)
Neutrophils Relative %: 72 %
Platelets: 432 10*3/uL — ABNORMAL HIGH (ref 150–400)
RBC: 4.67 MIL/uL (ref 3.87–5.11)
RDW: 14.3 % (ref 11.5–15.5)
WBC: 10.9 10*3/uL — ABNORMAL HIGH (ref 4.0–10.5)
nRBC: 0 % (ref 0.0–0.2)

## 2023-11-14 LAB — CBG MONITORING, ED
Glucose-Capillary: 346 mg/dL — ABNORMAL HIGH (ref 70–99)
Glucose-Capillary: 402 mg/dL — ABNORMAL HIGH (ref 70–99)
Glucose-Capillary: 280 mg/dL — ABNORMAL HIGH (ref 70–99)

## 2023-11-14 MED ORDER — SODIUM CHLORIDE 0.9 % IV BOLUS
1000.0000 mL | Freq: Once | INTRAVENOUS | Status: AC
  Administered 2023-11-14: 1000 mL via INTRAVENOUS

## 2023-11-14 MED ORDER — INSULIN ASPART 100 UNIT/ML IJ SOLN
10.0000 [IU] | Freq: Once | INTRAMUSCULAR | Status: AC
  Administered 2023-11-14: 10 [IU] via SUBCUTANEOUS
  Filled 2023-11-14: qty 0.1

## 2023-11-14 MED ORDER — LIDOCAINE-EPINEPHRINE 2 %-1:100000 IJ SOLN
20.0000 mL | Freq: Once | INTRAMUSCULAR | Status: AC
  Administered 2023-11-14: 20 mL via INTRADERMAL
  Filled 2023-11-14: qty 1

## 2023-11-14 MED ORDER — ACETAMINOPHEN 500 MG PO TABS
1000.0000 mg | ORAL_TABLET | Freq: Once | ORAL | Status: AC
Start: 2023-11-14 — End: 2023-11-14
  Administered 2023-11-14: 1000 mg via ORAL
  Filled 2023-11-14: qty 2

## 2023-11-14 NOTE — Discharge Instructions (Signed)
 Absorbable sutures: Keep sutures dry. Please refrain from swimming or bathing. Area can be gently cleaned with mild soap and water after 24-48 hours. Sutures will absorb and fall out within 7-10 days.  Alternating between 650 mg Tylenol  and 400 mg Advil : The best way to alternate taking Acetaminophen  (example Tylenol ) and Ibuprofen  (example Advil /Motrin ) is to take them 3 hours apart. For example, if you take ibuprofen  at 6 am you can then take Tylenol  at 9 am. You can continue this regimen throughout the day, making sure you do not exceed the recommended maximum dose for each drug.
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
## 2023-12-31 ENCOUNTER — Other Ambulatory Visit: Payer: Self-pay | Admitting: Nurse Practitioner

## 2023-12-31 DIAGNOSIS — E1065 Type 1 diabetes mellitus with hyperglycemia: Secondary | ICD-10-CM

## 2024-01-10 ENCOUNTER — Other Ambulatory Visit: Payer: Self-pay | Admitting: Family Medicine

## 2024-01-10 DIAGNOSIS — E1065 Type 1 diabetes mellitus with hyperglycemia: Secondary | ICD-10-CM

## 2024-01-16 ENCOUNTER — Ambulatory Visit: Admitting: Family Medicine

## 2024-01-24 ENCOUNTER — Ambulatory Visit: Admitting: Family Medicine

## 2024-01-25 ENCOUNTER — Other Ambulatory Visit (HOSPITAL_COMMUNITY)

## 2024-01-25 ENCOUNTER — Encounter (HOSPITAL_COMMUNITY): Payer: Self-pay

## 2024-01-25 ENCOUNTER — Other Ambulatory Visit: Payer: Self-pay

## 2024-01-25 ENCOUNTER — Emergency Department (HOSPITAL_COMMUNITY)

## 2024-01-25 ENCOUNTER — Inpatient Hospital Stay (HOSPITAL_COMMUNITY)
Admission: EM | Admit: 2024-01-25 | Discharge: 2024-01-27 | DRG: 073 | Disposition: A | Discharge status: Home / Self Care | Attending: Internal Medicine | Admitting: Internal Medicine

## 2024-01-25 DIAGNOSIS — R1112 Projectile vomiting: Secondary | ICD-10-CM | POA: Diagnosis not present

## 2024-01-25 DIAGNOSIS — R739 Hyperglycemia, unspecified: Secondary | ICD-10-CM

## 2024-01-25 DIAGNOSIS — R112 Nausea with vomiting, unspecified: Principal | ICD-10-CM | POA: Diagnosis present

## 2024-01-25 DIAGNOSIS — D72829 Elevated white blood cell count, unspecified: Secondary | ICD-10-CM | POA: Diagnosis present

## 2024-01-25 DIAGNOSIS — K3184 Gastroparesis: Secondary | ICD-10-CM | POA: Diagnosis present

## 2024-01-25 DIAGNOSIS — R6511 Systemic inflammatory response syndrome (SIRS) of non-infectious origin with acute organ dysfunction: Secondary | ICD-10-CM | POA: Diagnosis present

## 2024-01-25 DIAGNOSIS — R6510 Systemic inflammatory response syndrome (SIRS) of non-infectious origin without acute organ dysfunction: Secondary | ICD-10-CM | POA: Diagnosis present

## 2024-01-25 DIAGNOSIS — E860 Dehydration: Secondary | ICD-10-CM | POA: Diagnosis present

## 2024-01-25 DIAGNOSIS — F419 Anxiety disorder, unspecified: Secondary | ICD-10-CM | POA: Diagnosis present

## 2024-01-25 DIAGNOSIS — R109 Unspecified abdominal pain: Secondary | ICD-10-CM | POA: Diagnosis not present

## 2024-01-25 DIAGNOSIS — E1043 Type 1 diabetes mellitus with diabetic autonomic (poly)neuropathy: Principal | ICD-10-CM | POA: Diagnosis present

## 2024-01-25 DIAGNOSIS — E875 Hyperkalemia: Secondary | ICD-10-CM | POA: Diagnosis present

## 2024-01-25 DIAGNOSIS — Y762 Prosthetic and other implants, materials and accessory obstetric and gynecological devices associated with adverse incidents: Secondary | ICD-10-CM | POA: Diagnosis present

## 2024-01-25 DIAGNOSIS — Z8249 Family history of ischemic heart disease and other diseases of the circulatory system: Secondary | ICD-10-CM

## 2024-01-25 DIAGNOSIS — Z87891 Personal history of nicotine dependence: Secondary | ICD-10-CM

## 2024-01-25 DIAGNOSIS — Z91419 Personal history of unspecified adult abuse: Secondary | ICD-10-CM

## 2024-01-25 DIAGNOSIS — T8332XA Displacement of intrauterine contraceptive device, initial encounter: Secondary | ICD-10-CM | POA: Diagnosis present

## 2024-01-25 DIAGNOSIS — Z79899 Other long term (current) drug therapy: Secondary | ICD-10-CM

## 2024-01-25 DIAGNOSIS — E109 Type 1 diabetes mellitus without complications: Secondary | ICD-10-CM | POA: Diagnosis present

## 2024-01-25 DIAGNOSIS — R197 Diarrhea, unspecified: Secondary | ICD-10-CM | POA: Diagnosis not present

## 2024-01-25 DIAGNOSIS — N179 Acute kidney failure, unspecified: Secondary | ICD-10-CM | POA: Diagnosis present

## 2024-01-25 DIAGNOSIS — Z833 Family history of diabetes mellitus: Secondary | ICD-10-CM

## 2024-01-25 DIAGNOSIS — R9431 Abnormal electrocardiogram [ECG] [EKG]: Secondary | ICD-10-CM | POA: Diagnosis not present

## 2024-01-25 DIAGNOSIS — E1065 Type 1 diabetes mellitus with hyperglycemia: Secondary | ICD-10-CM | POA: Diagnosis not present

## 2024-01-25 DIAGNOSIS — Z1152 Encounter for screening for COVID-19: Secondary | ICD-10-CM

## 2024-01-25 DIAGNOSIS — Z794 Long term (current) use of insulin: Secondary | ICD-10-CM

## 2024-01-25 DIAGNOSIS — K759 Inflammatory liver disease, unspecified: Secondary | ICD-10-CM | POA: Diagnosis present

## 2024-01-25 LAB — CBC
HCT: 40.8 % (ref 36.0–46.0)
HCT: 38.3 % (ref 36.0–46.0)
Hemoglobin: 13.8 g/dL (ref 12.0–15.0)
Hemoglobin: 13.3 g/dL (ref 12.0–15.0)
MCH: 29.8 pg (ref 26.0–34.0)
MCH: 30.6 pg (ref 26.0–34.0)
MCHC: 33.8 g/dL (ref 30.0–36.0)
MCHC: 34.7 g/dL (ref 30.0–36.0)
MCV: 88.1 fL (ref 80.0–100.0)
MCV: 88 fL (ref 80.0–100.0)
Platelets: 342 K/uL (ref 150–400)
Platelets: 333 K/uL (ref 150–400)
RBC: 4.63 MIL/uL (ref 3.87–5.11)
RBC: 4.35 MIL/uL (ref 3.87–5.11)
RDW: 13 % (ref 11.5–15.5)
RDW: 12.9 % (ref 11.5–15.5)
WBC: 20.4 K/uL — ABNORMAL HIGH (ref 4.0–10.5)
WBC: 19 K/uL — ABNORMAL HIGH (ref 4.0–10.5)
nRBC: 0 % (ref 0.0–0.2)
nRBC: 0 % (ref 0.0–0.2)

## 2024-01-25 LAB — COMPREHENSIVE METABOLIC PANEL WITH GFR
ALT: 77 U/L — ABNORMAL HIGH (ref 0–44)
AST: 144 U/L — ABNORMAL HIGH (ref 15–41)
Albumin: 4.5 g/dL (ref 3.5–5.0)
Alkaline Phosphatase: 101 U/L (ref 38–126)
Anion gap: 15 (ref 5–15)
BUN: 14 mg/dL (ref 6–20)
CO2: 25 mmol/L (ref 22–32)
Calcium: 10.1 mg/dL (ref 8.9–10.3)
Chloride: 97 mmol/L — ABNORMAL LOW (ref 98–111)
Creatinine, Ser: 1.16 mg/dL — ABNORMAL HIGH (ref 0.44–1.00)
GFR, Estimated: >60 mL/min (ref >60)
Glucose, Bld: 295 mg/dL — ABNORMAL HIGH (ref 70–99)
Potassium: 5.7 mmol/L — ABNORMAL HIGH (ref 3.5–5.1)
Sodium: 137 mmol/L (ref 135–145)
Total Bilirubin: 1.3 mg/dL — ABNORMAL HIGH (ref 0.0–1.2)
Total Protein: 7.8 g/dL (ref 6.5–8.1)

## 2024-01-25 LAB — BASIC METABOLIC PANEL WITH GFR
Anion gap: 15 (ref 5–15)
BUN: 14 mg/dL (ref 6–20)
CO2: 25 mmol/L (ref 22–32)
Calcium: 10.2 mg/dL (ref 8.9–10.3)
Chloride: 100 mmol/L (ref 98–111)
Creatinine, Ser: 1.24 mg/dL — ABNORMAL HIGH (ref 0.44–1.00)
GFR, Estimated: >60 mL/min (ref >60)
Glucose, Bld: 303 mg/dL — ABNORMAL HIGH (ref 70–99)
Potassium: 4.4 mmol/L (ref 3.5–5.1)
Sodium: 140 mmol/L (ref 135–145)

## 2024-01-25 LAB — I-STAT VENOUS BLOOD GAS, ED
Acid-Base Excess: 5 mmol/L — ABNORMAL HIGH (ref 0.0–2.0)
Bicarbonate: 30.9 mmol/L — ABNORMAL HIGH (ref 20.0–28.0)
Calcium, Ion: 1.09 mmol/L — ABNORMAL LOW (ref 1.15–1.40)
HCT: 44 % (ref 36.0–46.0)
Hemoglobin: 15 g/dL (ref 12.0–15.0)
O2 Saturation: 99 %
Potassium: 4.2 mmol/L (ref 3.5–5.1)
Sodium: 138 mmol/L (ref 135–145)
TCO2: 32 mmol/L (ref 22–32)
pCO2, Ven: 48.8 mmHg (ref 44–60)
pH, Ven: 7.41 (ref 7.25–7.43)
pO2, Ven: 143 mmHg — ABNORMAL HIGH (ref 32–45)

## 2024-01-25 LAB — URINALYSIS, ROUTINE W REFLEX MICROSCOPIC
Bilirubin Urine: NEGATIVE
Glucose, UA: 50 mg/dL — AB
Ketones, ur: NEGATIVE mg/dL
Leukocytes,Ua: NEGATIVE
Nitrite: NEGATIVE
Protein, ur: NEGATIVE mg/dL
Specific Gravity, Urine: 1.01 (ref 1.005–1.030)
pH: 7 (ref 5.0–8.0)

## 2024-01-25 LAB — LIPASE, BLOOD: Lipase: 24 U/L (ref 11–51)

## 2024-01-25 LAB — HCG, SERUM, QUALITATIVE: Preg, Serum: NEGATIVE

## 2024-01-25 LAB — BETA-HYDROXYBUTYRIC ACID: Beta-Hydroxybutyric Acid: 1.08 mmol/L — ABNORMAL HIGH (ref 0.05–0.27)

## 2024-01-25 LAB — HEPATITIS PANEL, ACUTE
HCV Ab: NONREACTIVE
Hep A IgM: NONREACTIVE
Hep B C IgM: NONREACTIVE
Hepatitis B Surface Ag: NONREACTIVE

## 2024-01-25 LAB — PROTIME-INR
INR: 0.9 (ref 0.8–1.2)
Prothrombin Time: 13.1 s (ref 11.4–15.2)

## 2024-01-25 LAB — MAGNESIUM: Magnesium: 2 mg/dL (ref 1.7–2.4)

## 2024-01-25 LAB — CBG MONITORING, ED
Glucose-Capillary: 143 mg/dL — ABNORMAL HIGH (ref 70–99)
Glucose-Capillary: 333 mg/dL — ABNORMAL HIGH (ref 70–99)
Glucose-Capillary: 304 mg/dL — ABNORMAL HIGH (ref 70–99)

## 2024-01-25 LAB — GLUCOSE, CAPILLARY: Glucose-Capillary: 221 mg/dL — ABNORMAL HIGH (ref 70–99)

## 2024-01-25 MED ORDER — METOPROLOL TARTRATE 5 MG/5ML IV SOLN: 5.0000 mg | INTRAVENOUS | Status: DC | PRN

## 2024-01-25 MED ORDER — IPRATROPIUM-ALBUTEROL 0.5-2.5 (3) MG/3ML IN SOLN: 3.0000 mL | RESPIRATORY_TRACT | Status: DC | PRN

## 2024-01-25 MED ORDER — HYDROMORPHONE HCL 1 MG/ML IJ SOLN: 0.5000 mg | INTRAMUSCULAR | Status: DC | PRN

## 2024-01-25 MED ORDER — OXYCODONE HCL 5 MG PO TABS
5.0000 mg | ORAL_TABLET | ORAL | Status: DC | PRN
  Administered 2024-01-26 – 2024-01-27 (×2): 5 mg via ORAL
  Filled 2024-01-25 (×2): qty 1

## 2024-01-25 MED ORDER — ENOXAPARIN SODIUM 40 MG/0.4ML IJ SOSY
40.0000 mg | PREFILLED_SYRINGE | INTRAMUSCULAR | Status: DC
  Administered 2024-01-25 – 2024-01-26 (×2): 40 mg via SUBCUTANEOUS
  Filled 2024-01-25 (×2): qty 0.4

## 2024-01-25 MED ORDER — HYDRALAZINE HCL 20 MG/ML IJ SOLN: 10.0000 mg | INTRAMUSCULAR | Status: DC | PRN

## 2024-01-25 MED ORDER — ONDANSETRON HCL 4 MG/2ML IJ SOLN
4.0000 mg | Freq: Four times a day (QID) | INTRAMUSCULAR | Status: DC | PRN
  Administered 2024-01-26 – 2024-01-27 (×2): 4 mg via INTRAVENOUS
  Filled 2024-01-25 (×2): qty 2

## 2024-01-25 MED ORDER — SODIUM CHLORIDE 0.9 % IV SOLN
12.5000 mg | Freq: Once | INTRAVENOUS | Status: AC
  Administered 2024-01-25: 12.5 mg via INTRAVENOUS
  Filled 2024-01-25: qty 12.5

## 2024-01-25 MED ORDER — CALCIUM GLUCONATE-NACL 1-0.675 GM/50ML-% IV SOLN
1.0000 g | Freq: Once | INTRAVENOUS | Status: AC
  Administered 2024-01-25: 1000 mg via INTRAVENOUS
  Filled 2024-01-25: qty 50

## 2024-01-25 MED ORDER — INSULIN ASPART 100 UNIT/ML IJ SOLN
0.0000 [IU] | Freq: Every day | INTRAMUSCULAR | Status: DC
  Administered 2024-01-25: 2 [IU] via SUBCUTANEOUS
  Administered 2024-01-26: 4 [IU] via SUBCUTANEOUS

## 2024-01-25 MED ORDER — ACETAMINOPHEN 325 MG PO TABS: 650.0000 mg | ORAL_TABLET | Freq: Four times a day (QID) | ORAL | Status: DC | PRN

## 2024-01-25 MED ORDER — INSULIN GLARGINE-YFGN 100 UNIT/ML ~~LOC~~ SOLN
25.0000 [IU] | Freq: Every day | SUBCUTANEOUS | Status: DC
  Filled 2024-01-25: qty 0.25

## 2024-01-25 MED ORDER — SENNOSIDES-DOCUSATE SODIUM 8.6-50 MG PO TABS: 1.0000 | ORAL_TABLET | Freq: Every evening | ORAL | Status: DC | PRN

## 2024-01-25 MED ORDER — INSULIN ASPART 100 UNIT/ML IJ SOLN
0.0000 [IU] | Freq: Three times a day (TID) | INTRAMUSCULAR | Status: DC
  Administered 2024-01-26: 2 [IU] via SUBCUTANEOUS
  Administered 2024-01-26 – 2024-01-27 (×2): 15 [IU] via SUBCUTANEOUS

## 2024-01-25 MED ORDER — INSULIN ASPART 100 UNIT/ML IV SOLN
10.0000 [IU] | Freq: Once | INTRAVENOUS | Status: AC
  Administered 2024-01-25: 10 [IU] via INTRAVENOUS

## 2024-01-25 MED ORDER — SODIUM CHLORIDE 0.9 % IV SOLN: Freq: Once | INTRAVENOUS | Status: AC

## 2024-01-25 MED ORDER — IOHEXOL 350 MG/ML SOLN
65.0000 mL | Freq: Once | INTRAVENOUS | Status: AC | PRN
  Administered 2024-01-25: 65 mL via INTRAVENOUS

## 2024-01-25 MED ORDER — SODIUM CHLORIDE 0.9 % IV BOLUS
1000.0000 mL | Freq: Once | INTRAVENOUS | Status: AC
  Administered 2024-01-25: 1000 mL via INTRAVENOUS

## 2024-01-25 MED ORDER — ONDANSETRON 4 MG PO TBDP
4.0000 mg | ORAL_TABLET | Freq: Once | ORAL | Status: AC | PRN
  Administered 2024-01-25: 4 mg via ORAL
  Filled 2024-01-25: qty 1

## 2024-01-25 MED ORDER — DROPERIDOL 2.5 MG/ML IJ SOLN
1.2500 mg | Freq: Once | INTRAMUSCULAR | Status: AC
  Administered 2024-01-25: 1.25 mg via INTRAVENOUS
  Filled 2024-01-25: qty 2

## 2024-01-25 MED ORDER — PANTOPRAZOLE SODIUM 40 MG IV SOLR
40.0000 mg | Freq: Two times a day (BID) | INTRAVENOUS | Status: DC
  Administered 2024-01-25 – 2024-01-27 (×4): 40 mg via INTRAVENOUS
  Filled 2024-01-25 (×4): qty 10

## 2024-01-25 MED ORDER — METOCLOPRAMIDE HCL 5 MG/ML IJ SOLN
10.0000 mg | Freq: Three times a day (TID) | INTRAMUSCULAR | Status: DC
  Administered 2024-01-25 – 2024-01-27 (×5): 10 mg via INTRAVENOUS
  Filled 2024-01-25 (×5): qty 2

## 2024-01-25 MED ORDER — NICOTINE 21 MG/24HR TD PT24: 21.0000 mg | MEDICATED_PATCH | Freq: Every day | TRANSDERMAL | Status: DC | PRN

## 2024-01-25 NOTE — ED Provider Notes (Signed)
 Patient seen after prior ED provider  Hospitalist service made aware of case and will evaluate for admission.   Laurice Maude BROCKS, MD 01/25/24 [phone]
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
## 2024-01-25 NOTE — ED Notes (Signed)
 Very hard stick unable to get labs.

## 2024-01-25 NOTE — ED Notes (Signed)
 Patient transported to CT

## 2024-01-25 NOTE — ED Notes (Signed)
 Called and placed PT on monitor CCMD

## 2024-01-25 NOTE — ED Notes (Signed)
 Called and placed PT on monitor with CCMD.

## 2024-01-25 NOTE — ED Provider Notes (Signed)
 [place] EMERGENCY DEPARTMENT AT [facility] Provider Note   CSN: [id number] Arrival date & time: 01/25/24  1115     Patient presents with: Abdominal Pain   Sabrina Sutton is a 28 y.o. female with type 1 diabetes here with n/v/diarrhea x 2 days. Gives insulin  at home 6 units lantus  and 10 units SS TID with meals novalog, including today.  Persistent vomiting and abd pain.  Hx of admission in March for the same thing, felt to be due to [facility], with unremarkable CT scan of the abdomen at that time.  She still uses marijuana.   HPI     Prior to Admission medications   Medication Sig Start Date End Date Taking? Authorizing Provider  insulin  glargine (LANTUS ) 100 UNIT/ML injection Inject 0.25 mLs (25 Units total) into the skin daily. Patient taking differently: Inject 5 Units into the skin daily. 10/15/23  Yes Acheampong, Maude POUR, MD  NOVOLOG  FLEXPEN 100 UNIT/ML FlexPen INJECT 4 TO 10 UNITS UNDER THE SKIN THREE TIMES DAILY WITH MEALS IF GLUCOSE IS ABOVE 60 AND YOU ARE EATING 08/18/23  Yes [name], DO  ondansetron  (ZOFRAN ) 4 MG tablet Take 1 tablet (4 mg total) by mouth every 6 (six) hours as needed for nausea. 10/14/23  Yes Acheampong, Maude POUR, MD  glucose blood test strip Use as instructed 12/05/22   Macario Dorothyann HERO, MD  Insulin  Pen Needle 32G X 4 MM MISC Use to inject insulin  daily 11/17/22   Howell Lunger, DO    Allergies: Patient has no known allergies.    Review of Systems  Updated Vital Signs BP (!) 128/97   Pulse 70   Temp 98.3 F (36.8 C) (Oral)   Resp 20   Ht 5' 7 (1.702 m)   Wt 52.2 kg   LMP 01/24/2024 (Approximate)   SpO2 100%   BMI 18.01 kg/m   Physical Exam Constitutional:      General: She is not in acute distress.    Comments: Vomiting  HENT:     Head: Normocephalic and atraumatic.  Eyes:     Conjunctiva/sclera: Conjunctivae normal.     Pupils: Pupils are equal, round, and reactive to light.  Cardiovascular:     Rate and Rhythm: Normal  rate and regular rhythm.  Pulmonary:     Effort: Pulmonary effort is normal. No respiratory distress.  Abdominal:     General: There is no distension.     Tenderness: There is abdominal tenderness.  Skin:    General: Skin is warm and dry.  Neurological:     General: No focal deficit present.     Mental Status: She is alert. Mental status is at baseline.  Psychiatric:        Mood and Affect: Mood normal.        Behavior: Behavior normal.     (all labs ordered are listed, but only abnormal results are displayed) Labs Reviewed  COMPREHENSIVE METABOLIC PANEL WITH GFR - Abnormal; Notable for the following components:      Result Value   Potassium 5.7 (*)    Chloride 97 (*)    Glucose, Bld 295 (*)    Creatinine, Ser 1.16 (*)    AST 144 (*)    ALT 77 (*)    Total Bilirubin 1.3 (*)    All other components within normal limits  CBC - Abnormal; Notable for the following components:   WBC 20.4 (*)    All other components within normal limits  URINALYSIS, ROUTINE  W REFLEX MICROSCOPIC - Abnormal; Notable for the following components:   APPearance HAZY (*)    Glucose, UA 50 (*)    Hgb urine dipstick LARGE (*)    Bacteria, UA RARE (*)    All other components within normal limits  BETA-HYDROXYBUTYRIC ACID - Abnormal; Notable for the following components:   Beta-Hydroxybutyric Acid 1.08 (*)    All other components within normal limits  CBG MONITORING, ED - Abnormal; Notable for the following components:   Glucose-Capillary 143 (*)    All other components within normal limits  I-STAT VENOUS BLOOD GAS, ED - Abnormal; Notable for the following components:   pO2, Ven 143 (*)    Bicarbonate 30.9 (*)    Acid-Base Excess 5.0 (*)    Calcium , Ion 1.09 (*)    All other components within normal limits  LIPASE, BLOOD  HCG, SERUM, QUALITATIVE  MAGNESIUM     EKG: None  Radiology: No results found.   Procedures   Medications Ordered in the ED  sodium chloride  0.9 % bolus 1,000 mL (has  no administration in time range)  droperidol  (INAPSINE ) 2.5 MG/ML injection 1.25 mg (has no administration in time range)  promethazine  (PHENERGAN ) 12.5 mg in sodium chloride  0.9 % 50 mL IVPB (has no administration in time range)  calcium  gluconate 1 g/ 50 mL sodium chloride  IVPB (has no administration in time range)  insulin  aspart (novoLOG ) injection 10 Units (has no administration in time range)  ondansetron  (ZOFRAN -ODT) disintegrating tablet 4 mg (4 mg Oral Given 01/25/24 1139)    Clinical Course as of 01/25/24 1604  Fri Jan 25, 2024  1601 Pt signed out to Dr Laurice EDP pending CT scan and re-evaluation after medications [MT]    Clinical Course User Index [MT] Doshie Maggi, Donnice PARAS, MD                                 Medical Decision Making Amount and/or Complexity of Data Reviewed Labs: ordered. Radiology: ordered. ECG/medicine tests: ordered.  Risk OTC drugs. Prescription drug management.   This patient presents to the ED with concern for nausea, vomiting, diarrhea. This involves an extensive number of treatment options, and is a complaint that carries with it a high risk of complications and morbidity.  The differential diagnosis includes CHS most likely vs viral illness vs pancreatitis vs other  Co-morbidities that complicate the patient evaluation: hx of CHS and cannabis ongoing use  External records from outside source obtained and reviewed including hospital discharge summary from March  I ordered and personally interpreted labs.  The pertinent results include:  no acidosis, hyperkalemia, glucose wnl, WBC 20.4  The patient was maintained on a cardiac monitor.  I personally viewed and interpreted the cardiac monitored which showed an underlying rhythm of: NSR  I ordered medication including IV fluids and antiemetics  I have reviewed the patients home medicines and have made adjustments as needed       Final diagnoses:  None    ED Discharge Orders     None           Cottie Donnice PARAS, MD 01/25/24 [phone]

## 2024-01-25 NOTE — ED Triage Notes (Signed)
 Pt c/o abdominal pain and vomiting that started today. Pt has had diarrhea. Pt has type 1 diabetes.

## 2024-01-25 NOTE — ED Notes (Signed)
 Transport called. Sabrina Sutton is on his way

## 2024-01-25 NOTE — ED Provider Triage Note (Addendum)
 Emergency Medicine Provider Triage Evaluation Note  Jenna Routzahn , a 28 y.o. female  was evaluated in triage.  Pt complains of nausea, vomiting, abdominal pain, diarrhea. Started this morning. Hx of T1DM, sugars have been okay at home per patient. No blood in vomit or stool.   Review of Systems  Positive: Nausea, vomiting, abdominal pain, diarrhea Negative: Fever, chills, chest pain, shortness of breath  Physical Exam  BP (!) 137/90 (BP Location: Left Arm)   Pulse 79   Temp 98.4 F (36.9 C) (Oral)   Resp (!) 24   Ht 5' 7 (1.702 m)   Wt 52.2 kg   LMP 01/24/2024 (Approximate)   SpO2 100%   BMI 18.01 kg/m  Gen:   Awake, no distress, actively vomiting Resp:  Normal effort  MSK:   Moves extremities without difficulty  Other:  Diffuse abdominal tenderness with palpation, not localized, no rebound or guarding, soft   Medical Decision Making  Medically screening exam initiated at 12:21 PM.  Appropriate orders placed.  Wynonna Mawson was informed that the remainder of the evaluation will be completed by another provider, this initial triage assessment does not replace that evaluation, and the importance of remaining in the ED until their evaluation is complete.  Diet NPO, CBC, CMP, pregnancy, lipase, UA, zofran , CBG monitoring   Janetta Terrall FALCON, PA-C 01/25/24 1225    Janetta Terrall FALCON, PA-C 01/25/24 1226

## 2024-01-25 NOTE — ED Notes (Signed)
PT ambulated to the bathroom.

## 2024-01-26 ENCOUNTER — Observation Stay (HOSPITAL_COMMUNITY)

## 2024-01-26 DIAGNOSIS — Z833 Family history of diabetes mellitus: Secondary | ICD-10-CM | POA: Diagnosis not present

## 2024-01-26 DIAGNOSIS — R7401 Elevation of levels of liver transaminase levels: Secondary | ICD-10-CM | POA: Diagnosis not present

## 2024-01-26 DIAGNOSIS — R112 Nausea with vomiting, unspecified: Secondary | ICD-10-CM | POA: Diagnosis not present

## 2024-01-26 DIAGNOSIS — N179 Acute kidney failure, unspecified: Secondary | ICD-10-CM | POA: Diagnosis not present

## 2024-01-26 DIAGNOSIS — Z87891 Personal history of nicotine dependence: Secondary | ICD-10-CM | POA: Diagnosis not present

## 2024-01-26 DIAGNOSIS — Z79899 Other long term (current) drug therapy: Secondary | ICD-10-CM | POA: Diagnosis not present

## 2024-01-26 DIAGNOSIS — F419 Anxiety disorder, unspecified: Secondary | ICD-10-CM | POA: Diagnosis not present

## 2024-01-26 DIAGNOSIS — Z91419 Personal history of unspecified adult abuse: Secondary | ICD-10-CM | POA: Diagnosis not present

## 2024-01-26 DIAGNOSIS — R9431 Abnormal electrocardiogram [ECG] [EKG]: Secondary | ICD-10-CM | POA: Diagnosis not present

## 2024-01-26 DIAGNOSIS — R197 Diarrhea, unspecified: Secondary | ICD-10-CM | POA: Diagnosis not present

## 2024-01-26 DIAGNOSIS — R1112 Projectile vomiting: Secondary | ICD-10-CM

## 2024-01-26 DIAGNOSIS — E860 Dehydration: Secondary | ICD-10-CM | POA: Diagnosis not present

## 2024-01-26 DIAGNOSIS — R109 Unspecified abdominal pain: Secondary | ICD-10-CM | POA: Diagnosis not present

## 2024-01-26 DIAGNOSIS — Y762 Prosthetic and other implants, materials and accessory obstetric and gynecological devices associated with adverse incidents: Secondary | ICD-10-CM | POA: Diagnosis present

## 2024-01-26 DIAGNOSIS — K824 Cholesterolosis of gallbladder: Secondary | ICD-10-CM | POA: Diagnosis not present

## 2024-01-26 DIAGNOSIS — Z794 Long term (current) use of insulin: Secondary | ICD-10-CM | POA: Diagnosis not present

## 2024-01-26 DIAGNOSIS — E1065 Type 1 diabetes mellitus with hyperglycemia: Secondary | ICD-10-CM | POA: Diagnosis not present

## 2024-01-26 DIAGNOSIS — R6510 Systemic inflammatory response syndrome (SIRS) of non-infectious origin without acute organ dysfunction: Secondary | ICD-10-CM | POA: Diagnosis not present

## 2024-01-26 DIAGNOSIS — K759 Inflammatory liver disease, unspecified: Secondary | ICD-10-CM | POA: Diagnosis not present

## 2024-01-26 DIAGNOSIS — E875 Hyperkalemia: Secondary | ICD-10-CM | POA: Diagnosis not present

## 2024-01-26 DIAGNOSIS — Z8249 Family history of ischemic heart disease and other diseases of the circulatory system: Secondary | ICD-10-CM | POA: Diagnosis not present

## 2024-01-26 DIAGNOSIS — T8332XA Displacement of intrauterine contraceptive device, initial encounter: Secondary | ICD-10-CM | POA: Diagnosis not present

## 2024-01-26 DIAGNOSIS — R160 Hepatomegaly, not elsewhere classified: Secondary | ICD-10-CM | POA: Diagnosis not present

## 2024-01-26 DIAGNOSIS — E1043 Type 1 diabetes mellitus with diabetic autonomic (poly)neuropathy: Secondary | ICD-10-CM | POA: Diagnosis not present

## 2024-01-26 DIAGNOSIS — D72829 Elevated white blood cell count, unspecified: Secondary | ICD-10-CM | POA: Diagnosis not present

## 2024-01-26 DIAGNOSIS — Z1152 Encounter for screening for COVID-19: Secondary | ICD-10-CM | POA: Diagnosis not present

## 2024-01-26 DIAGNOSIS — R6511 Systemic inflammatory response syndrome (SIRS) of non-infectious origin with acute organ dysfunction: Secondary | ICD-10-CM | POA: Diagnosis not present

## 2024-01-26 DIAGNOSIS — K3184 Gastroparesis: Secondary | ICD-10-CM | POA: Diagnosis not present

## 2024-01-26 LAB — GLUCOSE, CAPILLARY
Glucose-Capillary: 417 mg/dL — ABNORMAL HIGH (ref 70–99)
Glucose-Capillary: 150 mg/dL — ABNORMAL HIGH (ref 70–99)
Glucose-Capillary: 118 mg/dL — ABNORMAL HIGH (ref 70–99)
Glucose-Capillary: 308 mg/dL — ABNORMAL HIGH (ref 70–99)

## 2024-01-26 LAB — RESPIRATORY PANEL BY PCR

## 2024-01-26 LAB — CBC
HCT: 34.7 % — ABNORMAL LOW (ref 36.0–46.0)
Hemoglobin: 12.1 g/dL (ref 12.0–15.0)
MCH: 30.9 pg (ref 26.0–34.0)
MCHC: 34.9 g/dL (ref 30.0–36.0)
MCV: 88.5 fL (ref 80.0–100.0)
Platelets: 286 K/uL (ref 150–400)
RBC: 3.92 MIL/uL (ref 3.87–5.11)
RDW: 13.3 % (ref 11.5–15.5)
WBC: 21.9 K/uL — ABNORMAL HIGH (ref 4.0–10.5)
nRBC: 0 % (ref 0.0–0.2)

## 2024-01-26 LAB — COMPREHENSIVE METABOLIC PANEL WITH GFR
ALT: 85 U/L — ABNORMAL HIGH (ref 0–44)
AST: 91 U/L — ABNORMAL HIGH (ref 15–41)
Albumin: 3.7 g/dL (ref 3.5–5.0)
Alkaline Phosphatase: 90 U/L (ref 38–126)
Anion gap: 15 (ref 5–15)
BUN: 13 mg/dL (ref 6–20)
CO2: 21 mmol/L — ABNORMAL LOW (ref 22–32)
Calcium: 8.8 mg/dL — ABNORMAL LOW (ref 8.9–10.3)
Chloride: 96 mmol/L — ABNORMAL LOW (ref 98–111)
Creatinine, Ser: 1.23 mg/dL — ABNORMAL HIGH (ref 0.44–1.00)
GFR, Estimated: >60 mL/min (ref >60)
Glucose, Bld: 247 mg/dL — ABNORMAL HIGH (ref 70–99)
Potassium: 2.9 mmol/L — ABNORMAL LOW (ref 3.5–5.1)
Sodium: 132 mmol/L — ABNORMAL LOW (ref 135–145)
Total Bilirubin: 1 mg/dL (ref 0.0–1.2)
Total Protein: 6.8 g/dL (ref 6.5–8.1)

## 2024-01-26 LAB — RESP PANEL BY RT-PCR (RSV, FLU A&B, COVID)  RVPGX2
Influenza A by PCR: NEGATIVE
Influenza B by PCR: NEGATIVE
Resp Syncytial Virus by PCR: NEGATIVE
SARS Coronavirus 2 by RT PCR: NEGATIVE

## 2024-01-26 LAB — MAGNESIUM: Magnesium: 1.6 mg/dL — ABNORMAL LOW (ref 1.7–2.4)

## 2024-01-26 LAB — PROCALCITONIN: Procalcitonin: 0.47 ng/mL

## 2024-01-26 LAB — RAPID URINE DRUG SCREEN, HOSP PERFORMED
Amphetamines: NOT DETECTED
Barbiturates: NOT DETECTED
Benzodiazepines: NOT DETECTED
Cocaine: NOT DETECTED
Opiates: NOT DETECTED
Tetrahydrocannabinol: POSITIVE — AB

## 2024-01-26 LAB — BETA-HYDROXYBUTYRIC ACID: Beta-Hydroxybutyric Acid: 3.87 mmol/L — ABNORMAL HIGH (ref 0.05–0.27)

## 2024-01-26 LAB — HEMOGLOBIN A1C
Hgb A1c MFr Bld: 10.3 % — ABNORMAL HIGH (ref 4.8–5.6)
Mean Plasma Glucose: 248.91 mg/dL

## 2024-01-26 LAB — PHOSPHORUS: Phosphorus: 2.5 mg/dL (ref 2.5–4.6)

## 2024-01-26 MED ORDER — POTASSIUM CHLORIDE 10 MEQ/100ML IV SOLN
10.0000 meq | INTRAVENOUS | Status: AC
  Administered 2024-01-26 (×4): 10 meq via INTRAVENOUS
  Filled 2024-01-26 (×3): qty 100

## 2024-01-26 MED ORDER — POTASSIUM CHLORIDE CRYS ER 20 MEQ PO TBCR
40.0000 meq | EXTENDED_RELEASE_TABLET | Freq: Once | ORAL | Status: AC
  Administered 2024-01-26: 40 meq via ORAL
  Filled 2024-01-26: qty 2

## 2024-01-26 MED ORDER — AMOXICILLIN-POT CLAVULANATE 875-125 MG PO TABS
1.0000 | ORAL_TABLET | Freq: Two times a day (BID) | ORAL | Status: DC
  Administered 2024-01-26 – 2024-01-27 (×3): 1 via ORAL
  Filled 2024-01-26 (×3): qty 1

## 2024-01-26 MED ORDER — INSULIN GLARGINE-YFGN 100 UNIT/ML ~~LOC~~ SOLN
30.0000 [IU] | Freq: Every day | SUBCUTANEOUS | Status: DC
  Administered 2024-01-26 – 2024-01-27 (×2): 30 [IU] via SUBCUTANEOUS
  Filled 2024-01-26 (×2): qty 0.3

## 2024-01-26 MED ORDER — MAGNESIUM SULFATE 4 GM/100ML IV SOLN
4.0000 g | Freq: Once | INTRAVENOUS | Status: AC
  Administered 2024-01-26: 4 g via INTRAVENOUS
  Filled 2024-01-26: qty 100

## 2024-01-26 NOTE — Plan of Care (Signed)

## 2024-01-26 NOTE — Progress Notes (Signed)
 PROGRESS NOTE    Mystic Labo  FMW:[id number] DOB: Dec 08, 1995 DOA: 01/25/2024 PCP: Patient, No Pcp Per    Brief Narrative:    28 y.o. female with medical history significant of diabetes mellitus type 1, marijuana use comes to the hospital with complaints of nausea vomiting.  Admitted with concerns of diabetic gastroparesis versus recurrent hyperemesis from cannabinoid use.  CT abdomen pelvis shows hepatomegaly with nonspecific hepatitis.  Acute hepatitis panel and right upper quadrant ultrasound are negative.  Assessment & Plan:  Principal Problem:   Nausea & vomiting Active Problems:   Uncontrolled type 1 diabetes mellitus with hyperglycemia (HCC)   Anxiety   Intractable nausea and vomiting    Intractable nausea vomiting -multifactorial in nature with combination of diabetic  gastroparesis versus cannabinoid use.  I do not believe patient is in diabetic ketoacidosis.  VBG is relatively normal.  Delay in a.m. labs   Transaminitis with hepatomegaly/hepatitis Elevated total bilirubin - LFTs, CMV/EBV-pending - Acute hepatitis panel, RUQ ultrasound-negative  -Denies any alcohol  use   Hyper kalemia - Resolved   Hypocalcemia - Calcium  gluconate given   SIRS - Persistent leukocytosis and fever.  No obvious source of infection, UA is negative.  Will check blood cultures, respiratory viral panel, COVID/flu and chest x-ray -Empiric Augmentin    Acute kidney injury - Baseline creatinine 0.8, admission creatinine 1.1.  Continue IV fluids  A.m. lab delays due to some technical difficulty   History of insulin -dependent diabetes mellitus type 1 -Resume Semglee  25 units daily.  Holding off on Premeal insulin  while she is not able to tolerate p.o.  Sliding scale and Accu-Cheks.   DVT prophylaxis: enoxaparin  (LOVENOX ) injection 40 mg Start: 01/25/24 1815 SCDs Start: 01/25/24 1811      Code Status: Full Code Family Communication:   Continue hospital stay for persistent fever  and leukocytosis    Subjective:  Patient seen and examined at bedside, tells me she feels much better.  Denies any nausea and vomiting. She was febrile overnight.  Examination:  General exam: Appears calm and comfortable  Respiratory system: Clear to auscultation. Respiratory effort normal. Cardiovascular system: S1 & S2 heard, RRR. No JVD, murmurs, rubs, gallops or clicks. No pedal edema. Gastrointestinal system: Abdomen is nondistended, soft and nontender. No organomegaly or masses felt. Normal bowel sounds heard. Central nervous system: Alert and oriented. No focal neurological deficits. Extremities: Symmetric 5 x 5 power. Skin: No rashes, lesions or ulcers Psychiatry: Judgement and insight appear normal. Mood & affect appropriate.                Diet Orders (From admission, onward)     Start     Ordered   01/26/24 1033  Diet Carb Modified Fluid consistency: Thin; Room service appropriate? Yes  Diet effective now       Question Answer Comment  Diet-HS Snack? Nothing   Calorie Level Medium 1600-2000   Fluid consistency: Thin   Room service appropriate? Yes      01/26/24 1032            Objective: Vitals:   01/25/24 2055 01/25/24 2347 01/26/24 0345 01/26/24 0745  BP:  (!) 129/92 103/63 132/86  Pulse:  94 63 79  Resp:  16 16   Temp: 98.1 F (36.7 C) 98.2 F (36.8 C) 98.8 F (37.1 C) 98.4 F (36.9 C)  TempSrc: Oral Oral Oral Oral  SpO2:  100% 100% 100%  Weight:      Height:        Intake/Output  Summary (Last 24 hours) at 01/26/2024 1037 Last data filed at 01/26/2024 9077 Gross per 24 hour  Intake 44.89 ml  Output 500 ml  Net -455.11 ml   Filed Weights   01/25/24 1134  Weight: 52.2 kg    Scheduled Meds:  amoxicillin -clavulanate  1 tablet Oral Q12H   enoxaparin  (LOVENOX ) injection  40 mg Subcutaneous Q24H   insulin  aspart  0-15 Units Subcutaneous TID WC   insulin  aspart  0-5 Units Subcutaneous QHS   insulin  glargine-yfgn  30 Units  Subcutaneous Daily   metoCLOPramide  (REGLAN ) injection  10 mg Intravenous Q8H   pantoprazole  (PROTONIX ) IV  40 mg Intravenous Q12H   Continuous Infusions:  Nutritional status     Body mass index is 18.01 kg/m.  Data Reviewed:   CBC: Recent Labs  Lab 01/25/24 1446 01/25/24 1510 01/25/24 1901 01/26/24 0651  WBC 20.4*  --  19.0* 21.9*  HGB 13.8 15.0 13.3 12.1  HCT 40.8 44.0 38.3 34.7*  MCV 88.1  --  88.0 88.5  PLT 342  --  333 286   Basic Metabolic Panel: Recent Labs  Lab 01/25/24 1446 01/25/24 1510 01/25/24 1901  NA 137 138 140  K 5.7* 4.2 4.4  CL 97*  --  100  CO2 25  --  25  GLUCOSE 295*  --  303*  BUN 14  --  14  CREATININE 1.16*  --  1.24*  CALCIUM  10.1  --  10.2  MG 2.0  --   --    GFR: Estimated Creatinine Clearance: 56.7 mL/min (A) (by C-G formula based on SCr of 1.24 mg/dL (H)). Liver Function Tests: Recent Labs  Lab 01/25/24 1446  AST 144*  ALT 77*  ALKPHOS 101  BILITOT 1.3*  PROT 7.8  ALBUMIN 4.5   Recent Labs  Lab 01/25/24 1446  LIPASE 24   No results for input(s): AMMONIA in the last 168 hours. Coagulation Profile: Recent Labs  Lab 01/25/24 1901  INR 0.9   Cardiac Enzymes: No results for input(s): CKTOTAL, CKMB, CKMBINDEX, TROPONINI in the last 168 hours. BNP (last 3 results) No results for input(s): PROBNP in the last 8760 hours. HbA1C: No results for input(s): HGBA1C in the last 72 hours. CBG: Recent Labs  Lab 01/25/24 1128 01/25/24 1709 01/25/24 1859 01/25/24 2102 01/26/24 0747  GLUCAP 143* 333* 304* 221* 417*   Lipid Profile: No results for input(s): CHOL, HDL, LDLCALC, TRIG, CHOLHDL, LDLDIRECT in the last 72 hours. Thyroid  Function Tests: No results for input(s): TSH, T4TOTAL, FREET4, T3FREE, THYROIDAB in the last 72 hours. Anemia Panel: No results for input(s): VITAMINB12, FOLATE, FERRITIN, TIBC, IRON, RETICCTPCT in the last 72 hours. Sepsis Labs: No results for  input(s): PROCALCITON, LATICACIDVEN in the last 168 hours.  No results found for this or any previous visit (from the past 240 hours).       Radiology Studies: US  Abdomen Limited RUQ (LIVER/GB) Result Date: 01/26/2024 CLINICAL DATA:  Transaminitis EXAM: ULTRASOUND ABDOMEN LIMITED RIGHT UPPER QUADRANT COMPARISON:  CT 01/25/2024 FINDINGS: Gallbladder: No gallbladder wall thickening, gallstones, sludge, or pericholecystic fluid. Negative sonographic Murphy's sign. 3.4 mm polyp noted. Common bile duct: Diameter: 1.6 mm Liver: No focal liver abnormality. Normal parenchymal echogenicity. Portal vein is patent on color Doppler imaging with normal direction of blood flow towards the liver. Other: None. IMPRESSION: 1. No acute findings. No gallstones, gallbladder wall thickening or pericholecystic fluid. 2. 3.4 mm gallbladder polyp.  No follow-up imaging recommended. Electronically Signed   By: Waddell Joan HERO.D.  On: 01/26/2024 06:08   CT ABDOMEN PELVIS W CONTRAST Result Date: 01/25/2024 CLINICAL DATA:  Nausea, vomiting, abdominal pain, diarrhea EXAM: CT ABDOMEN AND PELVIS WITH CONTRAST TECHNIQUE: Multidetector CT imaging of the abdomen and pelvis was performed using the standard protocol following bolus administration of intravenous contrast. RADIATION DOSE REDUCTION: This exam was performed according to the departmental dose-optimization program which includes automated exposure control, adjustment of the mA and/or kV according to patient size and/or use of iterative reconstruction technique. CONTRAST:  65mL OMNIPAQUE  IOHEXOL  350 MG/ML SOLN COMPARISON:  10/12/2023 FINDINGS: Lower chest: No acute abnormality. Hepatobiliary: No solid liver abnormality is seen. New hepatomegaly, maximum coronal span 20.2 cm. Diffusely edematous, reticular appearance of the liver with periportal edema. No biliary ductal dilatation. No gallstones, gallbladder wall thickening, or biliary dilatation. Pancreas: Unremarkable. No  pancreatic ductal dilatation or surrounding inflammatory changes. Spleen: Normal in size without significant abnormality. Adrenals/Urinary Tract: Adrenal glands are unremarkable. Kidneys are normal, without renal calculi, solid lesion, or hydronephrosis. Bladder is unremarkable. Stomach/Bowel: Stomach is within normal limits. Appendix appears normal. No evidence of bowel wall thickening, distention, or inflammatory changes. Vascular/Lymphatic: No significant vascular findings are present. No enlarged abdominal or pelvic lymph nodes. Reproductive: IUD present in the fundal endometrial cavity, which is malpositioned, rotated with body towards the apex of the fundus and tip appearing to penetrate into the myometrium (series 3, image 66, series 7, image 51). Other: No abdominal wall hernia or abnormality. No ascites. Musculoskeletal: No acute or significant osseous findings. IMPRESSION: 1. New hepatomegaly, maximum coronal span 20.2 cm. Diffusely edematous, reticular appearance of the liver with periportal edema. Findings are suggestive of nonspecific infectious or inflammatory hepatitis. 2. IUD present in the fundal endometrial cavity, which is malpositioned, rotated with body towards the apex of the fundus and tip appearing to penetrate into the myometrium. This is unchanged when compared to prior examination. Electronically Signed   By: Marolyn JONETTA Jaksch M.D.   On: 01/25/2024 16:41           LOS: 0 days   Time spent= 35 mins    Burgess JAYSON Dare, MD [facility]  If 7PM-7AM, please contact night-coverage  01/26/2024, 10:37 AM

## 2024-01-26 NOTE — Hospital Course (Addendum)
 Brief Narrative:    28 y.o. female with medical history significant of diabetes mellitus type 1, marijuana use comes to the hospital with complaints of nausea vomiting.  Admitted with concerns of diabetic gastroparesis versus recurrent hyperemesis from cannabinoid use.  CT abdomen pelvis shows hepatomegaly with nonspecific hepatitis.  Acute hepatitis panel and right upper quadrant ultrasound are negative. Nausea vomiting is likely secondary to diabetic gastroparesis and THC use.  She has been advised to slowly advance her diet, obtain outpatient gastric emptying study and quit marijuana.  For now she has been prescribed antiemetics and scheduled Reglan . LFTs are improving, EBV IgG is elevated but IgM negative, CMV is also pending.  Recommend outpatient follow-up.  Acute hepatitis panel and right upper quadrant ultrasound are negative.  Assessment & Plan:  Principal Problem:   Nausea & vomiting Active Problems:   Uncontrolled type 1 diabetes mellitus with hyperglycemia (HCC)   Anxiety   Intractable nausea and vomiting    Intractable nausea vomiting -multifactorial in nature with combination of diabetic  gastroparesis versus cannabinoid use.  I do not believe patient is in diabetic ketoacidosis.  VBG is relatively normal.  Symptoms have significantly improved, advised to slowly advance diet as home as tolerated.  She would benefit from outpatient gastric emptying study.  She is also advised to quit marijuana.    Transaminitis with hepatomegaly/hepatitis Elevated total bilirubin - LFTs, EBV IgM normal, IgG elevated.  CMV pending.  Follow-up outpatient - Acute hepatitis panel, RUQ ultrasound-negative  -Denies any alcohol  use   Hyper kalemia - Resolved   Hypocalcemia - Calcium  gluconate given   SIRS - Infectious workup remains negative.  Will treat with 5 days of Augmentin    Acute kidney injury - Baseline creatinine 0.8, admission creatinine 1.1.  Continue IV fluids    History of  insulin -dependent diabetes mellitus type 1 Patient reports she has enough insulin  at home therefore advised to resume.  She has been advised to follow-up outpatient endocrinology   DVT prophylaxis: enoxaparin  (LOVENOX ) injection 40 mg Start: 01/25/24 1815 SCDs Start: 01/25/24 1811      Code Status: Full Code Family Communication:   Discharge today   Subjective: Patient is very anxious that she has a 82-year-old daughter at home and she is wishing to be discharged.  Nausea has been intermittent but overall tolerating p.o.   Examination:  General exam: Appears calm and comfortable  Respiratory system: Clear to auscultation. Respiratory effort normal. Cardiovascular system: S1 & S2 heard, RRR. No JVD, murmurs, rubs, gallops or clicks. No pedal edema. Gastrointestinal system: Abdomen is nondistended, soft and nontender. No organomegaly or masses felt. Normal bowel sounds heard. Central nervous system: Alert and oriented. No focal neurological deficits. Extremities: Symmetric 5 x 5 power. Skin: No rashes, lesions or ulcers Psychiatry: Judgement and insight appear normal. Mood & affect appropriate.

## 2024-01-27 DIAGNOSIS — R1112 Projectile vomiting: Secondary | ICD-10-CM | POA: Diagnosis not present

## 2024-01-27 DIAGNOSIS — R112 Nausea with vomiting, unspecified: Secondary | ICD-10-CM | POA: Diagnosis not present

## 2024-01-27 LAB — COMPREHENSIVE METABOLIC PANEL WITH GFR
ALT: 78 U/L — ABNORMAL HIGH (ref 0–44)
AST: 65 U/L — ABNORMAL HIGH (ref 15–41)
Albumin: 3.7 g/dL (ref 3.5–5.0)
Alkaline Phosphatase: 99 U/L (ref 38–126)
Anion gap: 15 (ref 5–15)
BUN: 9 mg/dL (ref 6–20)
CO2: 20 mmol/L — ABNORMAL LOW (ref 22–32)
Calcium: 9.3 mg/dL (ref 8.9–10.3)
Chloride: 99 mmol/L (ref 98–111)
Creatinine, Ser: 0.95 mg/dL (ref 0.44–1.00)
GFR, Estimated: >60 mL/min (ref >60)
Glucose, Bld: 244 mg/dL — ABNORMAL HIGH (ref 70–99)
Potassium: 4.1 mmol/L (ref 3.5–5.1)
Sodium: 134 mmol/L — ABNORMAL LOW (ref 135–145)
Total Bilirubin: 0.9 mg/dL (ref 0.0–1.2)
Total Protein: 6.8 g/dL (ref 6.5–8.1)

## 2024-01-27 LAB — CBC
HCT: 38.9 % (ref 36.0–46.0)
Hemoglobin: 13.3 g/dL (ref 12.0–15.0)
MCH: 30.1 pg (ref 26.0–34.0)
MCHC: 34.2 g/dL (ref 30.0–36.0)
MCV: 88 fL (ref 80.0–100.0)
Platelets: 269 K/uL (ref 150–400)
RBC: 4.42 MIL/uL (ref 3.87–5.11)
RDW: 13 % (ref 11.5–15.5)
WBC: 15.3 K/uL — ABNORMAL HIGH (ref 4.0–10.5)
nRBC: 0 % (ref 0.0–0.2)

## 2024-01-27 LAB — GLUCOSE, CAPILLARY: Glucose-Capillary: 387 mg/dL — ABNORMAL HIGH (ref 70–99)

## 2024-01-27 LAB — CMV IGM: CMV IgM: <30 [AU]/ml (ref 0.0–29.9)

## 2024-01-27 LAB — EPSTEIN-BARR VIRUS VCA, IGG: EBV VCA IgG: 483 U/mL — ABNORMAL HIGH (ref 0.0–17.9)

## 2024-01-27 LAB — EPSTEIN-BARR VIRUS VCA, IGM: EBV VCA IgM: <36 U/mL (ref 0.0–35.9)

## 2024-01-27 LAB — MAGNESIUM: Magnesium: 2.3 mg/dL (ref 1.7–2.4)

## 2024-01-27 MED ORDER — AMOXICILLIN-POT CLAVULANATE 875-125 MG PO TABS: 1.0000 | ORAL_TABLET | Freq: Two times a day (BID) | ORAL | 0 refills | Status: AC

## 2024-01-27 MED ORDER — PANTOPRAZOLE SODIUM 20 MG PO TBEC: 20.0000 mg | DELAYED_RELEASE_TABLET | Freq: Every day | ORAL | 0 refills | Status: DC

## 2024-01-27 MED ORDER — INSULIN ASPART 100 UNIT/ML IJ SOLN
3.0000 [IU] | Freq: Three times a day (TID) | INTRAMUSCULAR | Status: DC
  Administered 2024-01-27: 3 [IU] via SUBCUTANEOUS

## 2024-01-27 MED ORDER — ONDANSETRON 4 MG PO TBDP: 4.0000 mg | ORAL_TABLET | Freq: Three times a day (TID) | ORAL | 0 refills | Status: DC | PRN

## 2024-01-27 MED ORDER — SODIUM CHLORIDE 0.9 % IV SOLN
25.0000 mg | Freq: Three times a day (TID) | INTRAVENOUS | Status: DC | PRN
  Administered 2024-01-27: 25 mg via INTRAVENOUS
  Filled 2024-01-27: qty 1

## 2024-01-27 MED ORDER — METOCLOPRAMIDE HCL 5 MG PO TABS: 5.0000 mg | ORAL_TABLET | Freq: Three times a day (TID) | ORAL | 0 refills | Status: DC

## 2024-01-27 MED ORDER — POTASSIUM CHLORIDE 10 MEQ/100ML IV SOLN
10.0000 meq | INTRAVENOUS | Status: AC
  Administered 2024-01-27 (×2): 10 meq via INTRAVENOUS
  Filled 2024-01-27 (×2): qty 100

## 2024-01-27 NOTE — Discharge Summary (Signed)
 Physician Discharge Summary  Sabrina Sutton FMW:[id number] DOB: 07-Jul-1996 DOA: 01/25/2024  PCP: Patient, No Pcp Per  Admit date: 01/25/2024 Discharge date: 01/27/2024  Admitted From: Disposition:    Recommendations for Outpatient Follow-up:  Follow up with PCP in 1-2 weeks Please obtain CMP/CBC in one week your next doctors visit.  Reglan  5mg  TID AC, Zofran  ODT prn PPI daily premeal Augmentin  for 5 days Needs outptn gastric emptying study. Counseled to quit using [facility] Reports she has sufficient amounts of insulin  at home.    Discharge Condition: Stable CODE STATUS: Full code Diet recommendation: Full liquid diet, slowly advance to diabetic  Brief/Interim Summary: Brief Narrative:    28 y.o. female with medical history significant of diabetes mellitus type 1, marijuana use comes to the hospital with complaints of nausea vomiting.  Admitted with concerns of diabetic gastroparesis versus recurrent hyperemesis from cannabinoid use.  CT abdomen pelvis shows hepatomegaly with nonspecific hepatitis.  Acute hepatitis panel and right upper quadrant ultrasound are negative. Nausea vomiting is likely secondary to diabetic gastroparesis and THC use.  She has been advised to slowly advance her diet, obtain outpatient gastric emptying study and quit marijuana.  For now she has been prescribed antiemetics and scheduled Reglan . LFTs are improving, EBV IgG is elevated but IgM negative, CMV is also pending.  Recommend outpatient follow-up.  Acute hepatitis panel and right upper quadrant ultrasound are negative.  Assessment & Plan:  Principal Problem:   Nausea & vomiting Active Problems:   Uncontrolled type 1 diabetes mellitus with hyperglycemia (HCC)   Anxiety   Intractable nausea and vomiting    Intractable nausea vomiting -multifactorial in nature with combination of diabetic  gastroparesis versus cannabinoid use.  I do not believe patient is in diabetic ketoacidosis.  VBG is relatively normal.   Symptoms have significantly improved, advised to slowly advance diet as home as tolerated.  She would benefit from outpatient gastric emptying study.  She is also advised to quit marijuana.    Transaminitis with hepatomegaly/hepatitis Elevated total bilirubin - LFTs, EBV IgM normal, IgG elevated.  CMV pending.  Follow-up outpatient - Acute hepatitis panel, RUQ ultrasound-negative  -Denies any alcohol  use   Hyper kalemia - Resolved   Hypocalcemia - Calcium  gluconate given   SIRS - Infectious workup remains negative.  Will treat with 5 days of Augmentin    Acute kidney injury - Baseline creatinine 0.8, admission creatinine 1.1.  Continue IV fluids    History of insulin -dependent diabetes mellitus type 1 Patient reports she has enough insulin  at home therefore advised to resume.  She has been advised to follow-up outpatient endocrinology   DVT prophylaxis: enoxaparin  (LOVENOX ) injection 40 mg Start: 01/25/24 1815 SCDs Start: 01/25/24 1811      Code Status: Full Code Family Communication:   Discharge today   Subjective: Patient is very anxious that she has a 12-year-old daughter at home and she is wishing to be discharged.  Nausea has been intermittent but overall tolerating p.o.   Examination:  General exam: Appears calm and comfortable  Respiratory system: Clear to auscultation. Respiratory effort normal. Cardiovascular system: S1 & S2 heard, RRR. No JVD, murmurs, rubs, gallops or clicks. No pedal edema. Gastrointestinal system: Abdomen is nondistended, soft and nontender. No organomegaly or masses felt. Normal bowel sounds heard. Central nervous system: Alert and oriented. No focal neurological deficits. Extremities: Symmetric 5 x 5 power. Skin: No rashes, lesions or ulcers Psychiatry: Judgement and insight appear normal. Mood & affect appropriate.    Discharge Diagnoses:  Principal  Problem:   Nausea & vomiting Active Problems:   Uncontrolled type 1 diabetes  mellitus with hyperglycemia (HCC)   Anxiety   Intractable nausea and vomiting      Discharge Exam: Vitals:   01/27/24 0404 01/27/24 0913  BP: 112/76 (!) 150/95  Pulse: 63 60  Resp: 16 16  Temp: 99.1 F (37.3 C)   SpO2: 100% 100%   Vitals:   01/26/24 1950 01/27/24 0404 01/27/24 0500 01/27/24 0913  BP: 104/63 112/76  (!) 150/95  Pulse: 81 63  60  Resp: 16 16  16   Temp: 99.6 F (37.6 C) 99.1 F (37.3 C)    TempSrc: Oral Oral    SpO2: 100% 100%  100%  Weight:   59.1 kg   Height:          Discharge Instructions   Allergies as of 01/27/2024   No Known Allergies      Medication List     STOP taking these medications    ondansetron  4 MG tablet Commonly known as: ZOFRAN        TAKE these medications    amoxicillin -clavulanate 875-125 MG tablet Commonly known as: AUGMENTIN  Take 1 tablet by mouth every 12 (twelve) hours for 5 days.   glucose blood test strip Use as instructed   insulin  glargine 100 UNIT/ML injection Commonly known as: LANTUS  Inject 0.25 mLs (25 Units total) into the skin daily. What changed: how much to take   metoCLOPramide  5 MG tablet Commonly known as: Reglan  Take 1 tablet (5 mg total) by mouth 3 (three) times daily before meals.   NovoLOG  FlexPen 100 UNIT/ML FlexPen Generic drug: insulin  aspart INJECT 4 TO 10 UNITS UNDER THE SKIN THREE TIMES DAILY WITH MEALS IF GLUCOSE IS ABOVE 60 AND YOU ARE EATING   ondansetron  4 MG disintegrating tablet Commonly known as: ZOFRAN -ODT Take 1 tablet (4 mg total) by mouth every 8 (eight) hours as needed for nausea or vomiting.   pantoprazole  20 MG tablet Commonly known as: Protonix  Take 1 tablet (20 mg total) by mouth daily before breakfast.   TechLite Pen Needles 32G X 4 MM Misc Generic drug: Insulin  Pen Needle Use to inject insulin  daily        No Known Allergies  You were cared for by a hospitalist during your hospital stay. If you have any questions about your discharge medications  or the care you received while you were in the hospital after you are discharged, you can call the unit and asked to speak with the hospitalist on call if the hospitalist that took care of you is not available. Once you are discharged, your primary care physician will handle any further medical issues. Please note that no refills for any discharge medications will be authorized once you are discharged, as it is imperative that you return to your primary care physician (or establish a relationship with a primary care physician if you do not have one) for your aftercare needs so that they can reassess your need for medications and monitor your lab values.  You were cared for by a hospitalist during your hospital stay. If you have any questions about your discharge medications or the care you received while you were in the hospital after you are discharged, you can call the unit and asked to speak with the hospitalist on call if the hospitalist that took care of you is not available. Once you are discharged, your primary care physician will handle any further medical issues. Please note that NO  REFILLS for any discharge medications will be authorized once you are discharged, as it is imperative that you return to your primary care physician (or establish a relationship with a primary care physician if you do not have one) for your aftercare needs so that they can reassess your need for medications and monitor your lab values.  Please request your Prim.MD to go over all Hospital Tests and Procedure/Radiological results at the follow up, please get all Hospital records sent to your Prim MD by signing hospital release before you go home.  Get CBC, CMP, 2 view Chest X ray checked  by Primary MD during your next visit or SNF MD in 5-7 days ( we routinely change or add medications that can affect your baseline labs and fluid status, therefore we recommend that you get the mentioned basic workup next visit with your  PCP, your PCP may decide not to get them or add new tests based on their clinical decision)  On your next visit with your primary care physician please Get Medicines reviewed and adjusted.  If you experience worsening of your admission symptoms, develop shortness of breath, life threatening emergency, suicidal or homicidal thoughts you must seek medical attention immediately by calling 911 or calling your MD immediately  if symptoms less severe.  You Must read complete instructions/literature along with all the possible adverse reactions/side effects for all the Medicines you take and that have been prescribed to you. Take any new Medicines after you have completely understood and accpet all the possible adverse reactions/side effects.   Do not drive, operate heavy machinery, perform activities at heights, swimming or participation in water activities or provide baby sitting services if your were admitted for syncope or siezures until you have seen by Primary MD or a Neurologist and advised to do so again.  Do not drive when taking Pain medications.   Procedures/Studies: DG Chest Port 1 View Result Date: 01/26/2024 CLINICAL DATA:  Fever and abdominal pain. EXAM: PORTABLE CHEST 1 VIEW COMPARISON:  Radiographs 10/12/2023 and 05/03/2018. FINDINGS: 0814 hours. The heart size and mediastinal contours are normal. The lungs are clear. There is no pleural effusion or pneumothorax. No acute osseous findings are identified. IMPRESSION: No evidence of acute cardiopulmonary process. Electronically Signed   By: Elsie Perone M.D.   On: 01/26/2024 12:07   US  Abdomen Limited RUQ (LIVER/GB) Result Date: 01/26/2024 CLINICAL DATA:  Transaminitis EXAM: ULTRASOUND ABDOMEN LIMITED RIGHT UPPER QUADRANT COMPARISON:  CT 01/25/2024 FINDINGS: Gallbladder: No gallbladder wall thickening, gallstones, sludge, or pericholecystic fluid. Negative sonographic Murphy's sign. 3.4 mm polyp noted. Common bile duct: Diameter: 1.6 mm  Liver: No focal liver abnormality. Normal parenchymal echogenicity. Portal vein is patent on color Doppler imaging with normal direction of blood flow towards the liver. Other: None. IMPRESSION: 1. No acute findings. No gallstones, gallbladder wall thickening or pericholecystic fluid. 2. 3.4 mm gallbladder polyp.  No follow-up imaging recommended. Electronically Signed   By: Waddell Calk M.D.   On: 01/26/2024 06:08   CT ABDOMEN PELVIS W CONTRAST Result Date: 01/25/2024 CLINICAL DATA:  Nausea, vomiting, abdominal pain, diarrhea EXAM: CT ABDOMEN AND PELVIS WITH CONTRAST TECHNIQUE: Multidetector CT imaging of the abdomen and pelvis was performed using the standard protocol following bolus administration of intravenous contrast. RADIATION DOSE REDUCTION: This exam was performed according to the departmental dose-optimization program which includes automated exposure control, adjustment of the mA and/or kV according to patient size and/or use of iterative reconstruction technique. CONTRAST:  65mL OMNIPAQUE  IOHEXOL  350 MG/ML  SOLN COMPARISON:  10/12/2023 FINDINGS: Lower chest: No acute abnormality. Hepatobiliary: No solid liver abnormality is seen. New hepatomegaly, maximum coronal span 20.2 cm. Diffusely edematous, reticular appearance of the liver with periportal edema. No biliary ductal dilatation. No gallstones, gallbladder wall thickening, or biliary dilatation. Pancreas: Unremarkable. No pancreatic ductal dilatation or surrounding inflammatory changes. Spleen: Normal in size without significant abnormality. Adrenals/Urinary Tract: Adrenal glands are unremarkable. Kidneys are normal, without renal calculi, solid lesion, or hydronephrosis. Bladder is unremarkable. Stomach/Bowel: Stomach is within normal limits. Appendix appears normal. No evidence of bowel wall thickening, distention, or inflammatory changes. Vascular/Lymphatic: No significant vascular findings are present. No enlarged abdominal or pelvic lymph  nodes. Reproductive: IUD present in the fundal endometrial cavity, which is malpositioned, rotated with body towards the apex of the fundus and tip appearing to penetrate into the myometrium (series 3, image 66, series 7, image 51). Other: No abdominal wall hernia or abnormality. No ascites. Musculoskeletal: No acute or significant osseous findings. IMPRESSION: 1. New hepatomegaly, maximum coronal span 20.2 cm. Diffusely edematous, reticular appearance of the liver with periportal edema. Findings are suggestive of nonspecific infectious or inflammatory hepatitis. 2. IUD present in the fundal endometrial cavity, which is malpositioned, rotated with body towards the apex of the fundus and tip appearing to penetrate into the myometrium. This is unchanged when compared to prior examination. Electronically Signed   By: Marolyn JONETTA Jaksch M.D.   On: 01/25/2024 16:41     The results of significant diagnostics from this hospitalization (including imaging, microbiology, ancillary and laboratory) are listed below for reference.     Microbiology: Recent Results (from the past 240 hours)  Respiratory (~20 pathogens) panel by PCR     Status: None   Collection Time: 01/26/24  7:37 AM   Specimen: Nasopharyngeal Swab; Respiratory  Result Value Ref Range Status   Adenovirus NOT DETECTED NOT DETECTED Final   Coronavirus 229E NOT DETECTED NOT DETECTED Final    Comment: (NOTE) The Coronavirus on the Respiratory Panel, DOES NOT test for the novel  Coronavirus (2019 nCoV)    Coronavirus HKU1 NOT DETECTED NOT DETECTED Final   Coronavirus NL63 NOT DETECTED NOT DETECTED Final   Coronavirus OC43 NOT DETECTED NOT DETECTED Final   Metapneumovirus NOT DETECTED NOT DETECTED Final   Rhinovirus / Enterovirus NOT DETECTED NOT DETECTED Final   Influenza A NOT DETECTED NOT DETECTED Final   Influenza B NOT DETECTED NOT DETECTED Final   Parainfluenza Virus 1 NOT DETECTED NOT DETECTED Final   Parainfluenza Virus 2 NOT DETECTED NOT  DETECTED Final   Parainfluenza Virus 3 NOT DETECTED NOT DETECTED Final   Parainfluenza Virus 4 NOT DETECTED NOT DETECTED Final   Respiratory Syncytial Virus NOT DETECTED NOT DETECTED Final   Bordetella pertussis NOT DETECTED NOT DETECTED Final   Bordetella Parapertussis NOT DETECTED NOT DETECTED Final   Chlamydophila pneumoniae NOT DETECTED NOT DETECTED Final   Mycoplasma pneumoniae NOT DETECTED NOT DETECTED Final    Comment: Performed at [facility], [address], [place], [place] [address]  Resp panel by RT-PCR (RSV, Flu A&B, Covid) Anterior Nasal Swab     Status: None   Collection Time: 01/26/24  7:37 AM   Specimen: Anterior Nasal Swab  Result Value Ref Range Status   SARS Coronavirus 2 by RT PCR NEGATIVE NEGATIVE Final   Influenza A by PCR NEGATIVE NEGATIVE Final   Influenza B by PCR NEGATIVE NEGATIVE Final    Comment: (NOTE) The Xpert Xpress SARS-CoV-2/FLU/RSV plus assay is intended  as an aid in the diagnosis of influenza from Nasopharyngeal swab specimens and should not be used as a sole basis for treatment. Nasal washings and aspirates are unacceptable for Xpert Xpress SARS-CoV-2/FLU/RSV testing.  Fact Sheet for Patients: BloggerCourse.com  Fact Sheet for Healthcare Providers: SeriousBroker.it  This test is not yet approved or cleared by the [place]  FDA and has been authorized for detection and/or diagnosis of SARS-CoV-2 by FDA under an Emergency Use Authorization (EUA). This EUA will remain in effect (meaning this test can be used) for the duration of the COVID-19 declaration under Section 564(b)(1) of the Act, 21 U.S.C. section 360bbb-3(b)(1), unless the authorization is terminated or revoked.     Resp Syncytial Virus by PCR NEGATIVE NEGATIVE Final    Comment: (NOTE) Fact Sheet for Patients: BloggerCourse.com  Fact Sheet for Healthcare  Providers: SeriousBroker.it  This test is not yet approved or cleared by the [place]  FDA and has been authorized for detection and/or diagnosis of SARS-CoV-2 by FDA under an Emergency Use Authorization (EUA). This EUA will remain in effect (meaning this test can be used) for the duration of the COVID-19 declaration under Section 564(b)(1) of the Act, 21 U.S.C. section 360bbb-3(b)(1), unless the authorization is terminated or revoked.  Performed at [facility], [address] [place], [place] [address]   Culture, blood (Routine X 2) w Reflex to ID Panel     Status: None (Preliminary result)   Collection Time: 01/26/24 10:33 AM   Specimen: BLOOD LEFT ARM  Result Value Ref Range Status   Specimen Description BLOOD LEFT ARM  Final   Special Requests   Final    BOTTLES DRAWN AEROBIC AND ANAEROBIC Blood Culture adequate volume   Culture   Final    NO GROWTH < 24 HOURS Performed at [facility], [address] [place], [place] [address]    Report Status PENDING  Incomplete  Culture, blood (Routine X 2) w Reflex to ID Panel     Status: None (Preliminary result)   Collection Time: 01/26/24 10:34 AM   Specimen: BLOOD RIGHT HAND  Result Value Ref Range Status   Specimen Description BLOOD RIGHT HAND  Final   Special Requests   Final    BOTTLES DRAWN AEROBIC ONLY Blood Culture results may not be optimal due to an inadequate volume of blood received in culture bottles   Culture   Final    NO GROWTH < 24 HOURS Performed at [facility], [address], [place], [place] [address]    Report Status PENDING  Incomplete     Labs: BNP (last 3 results) No results for input(s): BNP in the last 8760 hours. Basic Metabolic Panel: Recent Labs  Lab 01/25/24 1446 01/25/24 1510 01/25/24 1901 01/26/24 1033 01/27/24 0510  NA 137 138 140 132* 134*  K 5.7* 4.2 4.4 2.9* 4.1  CL 97*  --  100 96* 99  CO2 25  --  25 21* 20*  GLUCOSE 295*  --   303* 247* 244*  BUN 14  --  14 13 9   CREATININE 1.16*  --  1.24* 1.23* 0.95  CALCIUM  10.1  --  10.2 8.8* 9.3  MG 2.0  --   --  1.6* 2.3  PHOS  --   --   --  2.5  --    Liver Function Tests: Recent Labs  Lab 01/25/24 1446 01/26/24 1033 01/27/24 0510  AST 144* 91* 65*  ALT 77* 85* 78*  ALKPHOS 101 90 99  BILITOT 1.3* 1.0 0.9  PROT 7.8 6.8 6.8  ALBUMIN 4.5 3.7 3.7   Recent Labs  Lab 01/25/24 1446  LIPASE 24   No results for input(s): AMMONIA in the last 168 hours. CBC: Recent Labs  Lab 01/25/24 1446 01/25/24 1510 01/25/24 1901 01/26/24 0651 01/27/24 0510  WBC 20.4*  --  19.0* 21.9* 15.3*  HGB 13.8 15.0 13.3 12.1 13.3  HCT 40.8 44.0 38.3 34.7* 38.9  MCV 88.1  --  88.0 88.5 88.0  PLT 342  --  333 286 269   Cardiac Enzymes: No results for input(s): CKTOTAL, CKMB, CKMBINDEX, TROPONINI in the last 168 hours. BNP: Invalid input(s): POCBNP CBG: Recent Labs  Lab 01/26/24 0747 01/26/24 1126 01/26/24 1544 01/26/24 2134 01/27/24 0852  GLUCAP 417* 150* 118* 308* 387*   D-Dimer No results for input(s): DDIMER in the last 72 hours. Hgb A1c Recent Labs    01/26/24 1033  HGBA1C 10.3*   Lipid Profile No results for input(s): CHOL, HDL, LDLCALC, TRIG, CHOLHDL, LDLDIRECT in the last 72 hours. Thyroid  function studies No results for input(s): TSH, T4TOTAL, T3FREE, THYROIDAB in the last 72 hours.  Invalid input(s): FREET3 Anemia work up No results for input(s): VITAMINB12, FOLATE, FERRITIN, TIBC, IRON, RETICCTPCT in the last 72 hours. Urinalysis    Component Value Date/Time   COLORURINE YELLOW 01/25/2024 1136   APPEARANCEUR HAZY (A) 01/25/2024 1136   APPEARANCEUR Clear 03/29/2022 1154   LABSPEC 1.010 01/25/2024 1136   PHURINE 7.0 01/25/2024 1136   GLUCOSEU 50 (A) 01/25/2024 1136   HGBUR LARGE (A) 01/25/2024 1136   BILIRUBINUR NEGATIVE 01/25/2024 1136   BILIRUBINUR negative 12/05/2022 1437   BILIRUBINUR Negative  03/29/2022 1154   KETONESUR NEGATIVE 01/25/2024 1136   PROTEINUR NEGATIVE 01/25/2024 1136   UROBILINOGEN 0.2 12/05/2022 1437   UROBILINOGEN 0.2 12/18/2019 1947   NITRITE NEGATIVE 01/25/2024 1136   LEUKOCYTESUR NEGATIVE 01/25/2024 1136   Sepsis Labs Recent Labs  Lab 01/25/24 1446 01/25/24 1901 01/26/24 0651 01/27/24 0510  WBC 20.4* 19.0* 21.9* 15.3*   Microbiology Recent Results (from the past 240 hours)  Respiratory (~20 pathogens) panel by PCR     Status: None   Collection Time: 01/26/24  7:37 AM   Specimen: Nasopharyngeal Swab; Respiratory  Result Value Ref Range Status   Adenovirus NOT DETECTED NOT DETECTED Final   Coronavirus 229E NOT DETECTED NOT DETECTED Final    Comment: (NOTE) The Coronavirus on the Respiratory Panel, DOES NOT test for the novel  Coronavirus (2019 nCoV)    Coronavirus HKU1 NOT DETECTED NOT DETECTED Final   Coronavirus NL63 NOT DETECTED NOT DETECTED Final   Coronavirus OC43 NOT DETECTED NOT DETECTED Final   Metapneumovirus NOT DETECTED NOT DETECTED Final   Rhinovirus / Enterovirus NOT DETECTED NOT DETECTED Final   Influenza A NOT DETECTED NOT DETECTED Final   Influenza B NOT DETECTED NOT DETECTED Final   Parainfluenza Virus 1 NOT DETECTED NOT DETECTED Final   Parainfluenza Virus 2 NOT DETECTED NOT DETECTED Final   Parainfluenza Virus 3 NOT DETECTED NOT DETECTED Final   Parainfluenza Virus 4 NOT DETECTED NOT DETECTED Final   Respiratory Syncytial Virus NOT DETECTED NOT DETECTED Final   Bordetella pertussis NOT DETECTED NOT DETECTED Final   Bordetella Parapertussis NOT DETECTED NOT DETECTED Final   Chlamydophila pneumoniae NOT DETECTED NOT DETECTED Final   Mycoplasma pneumoniae NOT DETECTED NOT DETECTED Final    Comment: Performed at [facility], [address] [place], [place] [address]  Resp panel by RT-PCR (RSV, Flu A&B, Covid) Anterior Nasal Swab     Status: None   Collection Time: 01/26/24  7:37 AM   Specimen: Anterior Nasal Swab   Result Value Ref Range Status   SARS Coronavirus 2 by RT PCR NEGATIVE NEGATIVE Final   Influenza A by PCR NEGATIVE NEGATIVE Final   Influenza B by PCR NEGATIVE NEGATIVE Final    Comment: (NOTE) The Xpert Xpress SARS-CoV-2/FLU/RSV plus assay is intended as an aid in the diagnosis of influenza from Nasopharyngeal swab specimens and should not be used as a sole basis for treatment. Nasal washings and aspirates are unacceptable for Xpert Xpress SARS-CoV-2/FLU/RSV testing.  Fact Sheet for Patients: BloggerCourse.com  Fact Sheet for Healthcare Providers: SeriousBroker.it  This test is not yet approved or cleared by the [place]  FDA and has been authorized for detection and/or diagnosis of SARS-CoV-2 by FDA under an Emergency Use Authorization (EUA). This EUA will remain in effect (meaning this test can be used) for the duration of the COVID-19 declaration under Section 564(b)(1) of the Act, 21 U.S.C. section 360bbb-3(b)(1), unless the authorization is terminated or revoked.     Resp Syncytial Virus by PCR NEGATIVE NEGATIVE Final    Comment: (NOTE) Fact Sheet for Patients: BloggerCourse.com  Fact Sheet for Healthcare Providers: SeriousBroker.it  This test is not yet approved or cleared by the [place]  FDA and has been authorized for detection and/or diagnosis of SARS-CoV-2 by FDA under an Emergency Use Authorization (EUA). This EUA will remain in effect (meaning this test can be used) for the duration of the COVID-19 declaration under Section 564(b)(1) of the Act, 21 U.S.C. section 360bbb-3(b)(1), unless the authorization is terminated or revoked.  Performed at [facility], [address] [place], [place] [address]   Culture, blood (Routine X 2) w Reflex to ID Panel     Status: None (Preliminary result)   Collection Time: 01/26/24 10:33 AM   Specimen: BLOOD  LEFT ARM  Result Value Ref Range Status   Specimen Description BLOOD LEFT ARM  Final   Special Requests   Final    BOTTLES DRAWN AEROBIC AND ANAEROBIC Blood Culture adequate volume   Culture   Final    NO GROWTH < 24 HOURS Performed at [facility], [address], [place], [place] [address]    Report Status PENDING  Incomplete  Culture, blood (Routine X 2) w Reflex to ID Panel     Status: None (Preliminary result)   Collection Time: 01/26/24 10:34 AM   Specimen: BLOOD RIGHT HAND  Result Value Ref Range Status   Specimen Description BLOOD RIGHT HAND  Final   Special Requests   Final    BOTTLES DRAWN AEROBIC ONLY Blood Culture results may not be optimal due to an inadequate volume of blood received in culture bottles   Culture   Final    NO GROWTH < 24 HOURS Performed at [facility], [address] [place], [place] [address]    Report Status PENDING  Incomplete     Time coordinating discharge:  I have spent 35 minutes face to face with the patient and on the ward discussing the patients care, assessment, plan and disposition with other care givers. >50% of the time was devoted counseling the patient about the risks and benefits of treatment/Discharge disposition and coordinating care.   SIGNED:   Burgess JAYSON Dare, MD  [facility] 01/27/2024, 9:50 AM   If 7PM-7AM, please contact night-coverage

## 2024-01-27 NOTE — Plan of Care (Signed)
 Problem: Education: Goal: Ability to describe self-care measures that may prevent or decrease complications (Diabetes Survival Skills Education) will improve 01/27/2024 1117 by Consuela Grayce SAUNDERS, RN Outcome: Adequate for Discharge 01/27/2024 1117 by Consuela Grayce SAUNDERS, RN Outcome: Adequate for Discharge Goal: Individualized Educational Video(s) 01/27/2024 1117 by Consuela Grayce SAUNDERS, RN Outcome: Adequate for Discharge 01/27/2024 1117 by Consuela Grayce SAUNDERS, RN Outcome: Adequate for Discharge   Problem: Coping: Goal: Ability to adjust to condition or change in health will improve 01/27/2024 1117 by Consuela Grayce SAUNDERS, RN Outcome: Adequate for Discharge 01/27/2024 1117 by Consuela Grayce SAUNDERS, RN Outcome: Adequate for Discharge   Problem: Fluid Volume: Goal: Ability to maintain a balanced intake and output will improve 01/27/2024 1117 by Consuela Grayce SAUNDERS, RN Outcome: Adequate for Discharge 01/27/2024 1117 by Consuela Grayce SAUNDERS, RN Outcome: Adequate for Discharge   Problem: Health Behavior/Discharge Planning: Goal: Ability to identify and utilize available resources and services will improve 01/27/2024 1117 by Consuela Grayce SAUNDERS, RN Outcome: Adequate for Discharge 01/27/2024 1117 by Consuela Grayce SAUNDERS, RN Outcome: Adequate for Discharge Goal: Ability to manage health-related needs will improve 01/27/2024 1117 by Consuela Grayce SAUNDERS, RN Outcome: Adequate for Discharge 01/27/2024 1117 by Consuela Grayce SAUNDERS, RN Outcome: Adequate for Discharge   Problem: Metabolic: Goal: Ability to maintain appropriate glucose levels will improve 01/27/2024 1117 by Consuela Grayce SAUNDERS, RN Outcome: Adequate for Discharge 01/27/2024 1117 by Consuela Grayce SAUNDERS, RN Outcome: Adequate for Discharge   Problem: Nutritional: Goal: Maintenance of adequate nutrition will improve 01/27/2024 1117 by Consuela Grayce SAUNDERS, RN Outcome: Adequate for Discharge 01/27/2024 1117 by Consuela Grayce SAUNDERS, RN Outcome: Adequate for Discharge Goal: Progress toward achieving an optimal weight will  improve 01/27/2024 1117 by Consuela Grayce SAUNDERS, RN Outcome: Adequate for Discharge 01/27/2024 1117 by Consuela Grayce SAUNDERS, RN Outcome: Adequate for Discharge   Problem: Skin Integrity: Goal: Risk for impaired skin integrity will decrease 01/27/2024 1117 by Consuela Grayce SAUNDERS, RN Outcome: Adequate for Discharge 01/27/2024 1117 by Consuela Grayce SAUNDERS, RN Outcome: Adequate for Discharge   Problem: Tissue Perfusion: Goal: Adequacy of tissue perfusion will improve 01/27/2024 1117 by Consuela Grayce SAUNDERS, RN Outcome: Adequate for Discharge 01/27/2024 1117 by Consuela Grayce SAUNDERS, RN Outcome: Adequate for Discharge   Problem: Education: Goal: Knowledge of General Education information will improve Description: Including pain rating scale, medication(s)/side effects and non-pharmacologic comfort measures 01/27/2024 1117 by Consuela Grayce SAUNDERS, RN Outcome: Adequate for Discharge 01/27/2024 1117 by Consuela Grayce SAUNDERS, RN Outcome: Adequate for Discharge   Problem: Health Behavior/Discharge Planning: Goal: Ability to manage health-related needs will improve 01/27/2024 1117 by Consuela Grayce SAUNDERS, RN Outcome: Adequate for Discharge 01/27/2024 1117 by Consuela Grayce SAUNDERS, RN Outcome: Adequate for Discharge   Problem: Clinical Measurements: Goal: Ability to maintain clinical measurements within normal limits will improve 01/27/2024 1117 by Consuela Grayce SAUNDERS, RN Outcome: Adequate for Discharge 01/27/2024 1117 by Consuela Grayce SAUNDERS, RN Outcome: Adequate for Discharge Goal: Will remain free from infection 01/27/2024 1117 by Consuela Grayce SAUNDERS, RN Outcome: Adequate for Discharge 01/27/2024 1117 by Consuela Grayce SAUNDERS, RN Outcome: Adequate for Discharge Goal: Diagnostic test results will improve 01/27/2024 1117 by Consuela Grayce SAUNDERS, RN Outcome: Adequate for Discharge 01/27/2024 1117 by Consuela Grayce SAUNDERS, RN Outcome: Adequate for Discharge Goal: Respiratory complications will improve 01/27/2024 1117 by Consuela Grayce SAUNDERS, RN Outcome: Adequate for Discharge 01/27/2024 1117 by  Consuela Grayce SAUNDERS, RN Outcome: Adequate for Discharge Goal: Cardiovascular complication will be avoided 01/27/2024 1117 by Consuela Grayce SAUNDERS, RN Outcome:  Adequate for Discharge 01/27/2024 1117 by Consuela Grayce SAUNDERS, RN Outcome: Adequate for Discharge   Problem: Activity: Goal: Risk for activity intolerance will decrease 01/27/2024 1117 by Consuela Grayce SAUNDERS, RN Outcome: Adequate for Discharge 01/27/2024 1117 by Consuela Grayce SAUNDERS, RN Outcome: Adequate for Discharge   Problem: Nutrition: Goal: Adequate nutrition will be maintained 01/27/2024 1117 by Consuela Grayce SAUNDERS, RN Outcome: Adequate for Discharge 01/27/2024 1117 by Consuela Grayce SAUNDERS, RN Outcome: Adequate for Discharge   Problem: Coping: Goal: Level of anxiety will decrease 01/27/2024 1117 by Consuela Grayce SAUNDERS, RN Outcome: Adequate for Discharge 01/27/2024 1117 by Consuela Grayce SAUNDERS, RN Outcome: Adequate for Discharge   Problem: Elimination: Goal: Will not experience complications related to bowel motility 01/27/2024 1117 by Consuela Grayce SAUNDERS, RN Outcome: Adequate for Discharge 01/27/2024 1117 by Consuela Grayce SAUNDERS, RN Outcome: Adequate for Discharge Goal: Will not experience complications related to urinary retention 01/27/2024 1117 by Consuela Grayce SAUNDERS, RN Outcome: Adequate for Discharge 01/27/2024 1117 by Consuela Grayce SAUNDERS, RN Outcome: Adequate for Discharge   Problem: Pain Managment: Goal: General experience of comfort will improve and/or be controlled 01/27/2024 1117 by Consuela Grayce SAUNDERS, RN Outcome: Adequate for Discharge 01/27/2024 1117 by Consuela Grayce SAUNDERS, RN Outcome: Adequate for Discharge   Problem: Safety: Goal: Ability to remain free from injury will improve 01/27/2024 1117 by Consuela Grayce SAUNDERS, RN Outcome: Adequate for Discharge 01/27/2024 1117 by Consuela Grayce SAUNDERS, RN Outcome: Adequate for Discharge   Problem: Skin Integrity: Goal: Risk for impaired skin integrity will decrease 01/27/2024 1117 by Consuela Grayce SAUNDERS, RN Outcome: Adequate for Discharge 01/27/2024 1117  by Consuela Grayce SAUNDERS, RN Outcome: Adequate for Discharge

## 2024-01-27 NOTE — Plan of Care (Signed)

## 2024-01-27 NOTE — Plan of Care (Signed)

## 2024-01-31 ENCOUNTER — Ambulatory Visit: Admitting: Family Medicine

## 2024-01-31 LAB — CULTURE, BLOOD (ROUTINE X 2)
Culture: NO GROWTH
Culture: NO GROWTH
Special Requests: ADEQUATE

## 2024-01-31 NOTE — Progress Notes (Deleted)
   Acute Office Visit  Subjective:     Patient ID: Sabrina Sutton, female    DOB: 02/26/1996, 28 y.o.   MRN: [id number]  No chief complaint on file.   HPI  Discussed the use of AI scribe software for clinical note transcription with the patient, who gave verbal consent to proceed.  History of Present Illness      ROS Per HPI      Objective:    LMP 01/24/2024 (Approximate)    Physical Exam Vitals and nursing note reviewed.  Constitutional:      General: She is not in acute distress.    Appearance: Normal appearance. She is normal weight.  HENT:     Head: Normocephalic and atraumatic.     Right Ear: External ear normal.     Left Ear: External ear normal.     Nose: Nose normal.     Mouth/Throat:     Mouth: Mucous membranes are moist.     Pharynx: Oropharynx is clear.  Eyes:     Extraocular Movements: Extraocular movements intact.     Pupils: Pupils are equal, round, and reactive to light.  Cardiovascular:     Rate and Rhythm: Normal rate and regular rhythm.     Pulses: Normal pulses.     Heart sounds: Normal heart sounds.  Pulmonary:     Effort: Pulmonary effort is normal. No respiratory distress.     Breath sounds: Normal breath sounds. No wheezing, rhonchi or rales.  Musculoskeletal:        General: Normal range of motion.     Cervical back: Normal range of motion.     Right lower leg: No edema.     Left lower leg: No edema.  Lymphadenopathy:     Cervical: No cervical adenopathy.  Neurological:     General: No focal deficit present.     Mental Status: She is alert and oriented to person, place, and time.  Psychiatric:        Mood and Affect: Mood normal.        Thought Content: Thought content normal.    No results found for any visits on 01/31/24.      Assessment & Plan:   Assessment and Plan Assessment & Plan       No orders of the defined types were placed in this encounter.   No follow-ups on file.  Corean LITTIE Ku,  FNP

## 2024-01-31 NOTE — Patient Instructions (Incomplete)
 Welcome to [facility]!  Thank you for choosing [place] for your Primary Care needs.   We offer in person and video appointments for your convenience. You may call our office to schedule appointments, or you may schedule appointments with me through MyChart.   The best way to get in contact with me is via MyChart message. This will get to me faster than a phone call, unless there is an emergency, then please call 911.  The lab is located downstairs in the Sports Medicine building, we also have xray available there.

## 2024-02-04 ENCOUNTER — Telehealth: Payer: Self-pay | Admitting: General Practice

## 2024-02-04 ENCOUNTER — Other Ambulatory Visit: Payer: Self-pay | Admitting: Family

## 2024-02-04 DIAGNOSIS — E1065 Type 1 diabetes mellitus with hyperglycemia: Secondary | ICD-10-CM

## 2024-02-04 MED ORDER — GLUCOSE BLOOD VI STRP: ORAL_STRIP | 12 refills | Status: DC

## 2024-02-04 NOTE — Telephone Encounter (Signed)
 Copied from CRM [id number]. Topic: Clinical - Medication Question >> Feb 04, 2024  9:45 AM Sophia H wrote: Reason for CRM: Patient is requesting blood test strips for accucheck guide, advised was unsure if provider would be able to fill since she is not established, patient states okay, requests message be sent anyways. Please advise via my chart or by phone.    [id number] - [place], [place] - [address] AT [facility] OF [address]  ---  Please assist. TDW

## 2024-03-19 ENCOUNTER — Other Ambulatory Visit (HOSPITAL_COMMUNITY): Payer: Self-pay

## 2024-03-22 ENCOUNTER — Other Ambulatory Visit: Payer: Self-pay

## 2024-03-22 ENCOUNTER — Emergency Department (HOSPITAL_COMMUNITY)
Admission: EM | Admit: 2024-03-22 | Discharge: 2024-03-22 | Disposition: A | Discharge status: Home / Self Care | Attending: Emergency Medicine | Admitting: Emergency Medicine

## 2024-03-22 DIAGNOSIS — R1115 Cyclical vomiting syndrome unrelated to migraine: Secondary | ICD-10-CM | POA: Diagnosis not present

## 2024-03-22 DIAGNOSIS — R197 Diarrhea, unspecified: Secondary | ICD-10-CM | POA: Insufficient documentation

## 2024-03-22 DIAGNOSIS — E109 Type 1 diabetes mellitus without complications: Secondary | ICD-10-CM | POA: Diagnosis not present

## 2024-03-22 DIAGNOSIS — D72829 Elevated white blood cell count, unspecified: Secondary | ICD-10-CM | POA: Diagnosis not present

## 2024-03-22 DIAGNOSIS — R112 Nausea with vomiting, unspecified: Secondary | ICD-10-CM | POA: Insufficient documentation

## 2024-03-22 DIAGNOSIS — R9431 Abnormal electrocardiogram [ECG] [EKG]: Secondary | ICD-10-CM | POA: Diagnosis not present

## 2024-03-22 DIAGNOSIS — Z794 Long term (current) use of insulin: Secondary | ICD-10-CM | POA: Insufficient documentation

## 2024-03-22 DIAGNOSIS — E876 Hypokalemia: Secondary | ICD-10-CM | POA: Insufficient documentation

## 2024-03-22 DIAGNOSIS — E1065 Type 1 diabetes mellitus with hyperglycemia: Secondary | ICD-10-CM

## 2024-03-22 DIAGNOSIS — R1110 Vomiting, unspecified: Secondary | ICD-10-CM | POA: Diagnosis present

## 2024-03-22 LAB — I-STAT CG4 LACTIC ACID, ED: Lactic Acid, Venous: 3.7 mmol/L (ref 0.5–1.9)

## 2024-03-22 LAB — I-STAT VENOUS BLOOD GAS, ED
Acid-Base Excess: 8 mmol/L — ABNORMAL HIGH (ref 0.0–2.0)
Bicarbonate: 31.6 mmol/L — ABNORMAL HIGH (ref 20.0–28.0)
Calcium, Ion: 1 mmol/L — ABNORMAL LOW (ref 1.15–1.40)
HCT: 46 % (ref 36.0–46.0)
Hemoglobin: 15.6 g/dL — ABNORMAL HIGH (ref 12.0–15.0)
O2 Saturation: 95 %
Potassium: 5.1 mmol/L (ref 3.5–5.1)
Sodium: 132 mmol/L — ABNORMAL LOW (ref 135–145)
TCO2: 33 mmol/L — ABNORMAL HIGH (ref 22–32)
pCO2, Ven: 39 mmHg — ABNORMAL LOW (ref 44–60)
pH, Ven: 7.517 — ABNORMAL HIGH (ref 7.25–7.43)
pO2, Ven: 70 mmHg — ABNORMAL HIGH (ref 32–45)

## 2024-03-22 LAB — COMPREHENSIVE METABOLIC PANEL WITH GFR
ALT: 30 U/L (ref 0–44)
AST: 37 U/L (ref 15–41)
Albumin: 5.2 g/dL — ABNORMAL HIGH (ref 3.5–5.0)
Alkaline Phosphatase: 89 U/L (ref 38–126)
Anion gap: 20 — ABNORMAL HIGH (ref 5–15)
BUN: 18 mg/dL (ref 6–20)
CO2: 25 mmol/L (ref 22–32)
Calcium: 10.5 mg/dL — ABNORMAL HIGH (ref 8.9–10.3)
Chloride: 91 mmol/L — ABNORMAL LOW (ref 98–111)
Creatinine, Ser: 1.07 mg/dL — ABNORMAL HIGH (ref 0.44–1.00)
GFR, Estimated: >60 mL/min (ref >60)
Glucose, Bld: 171 mg/dL — ABNORMAL HIGH (ref 70–99)
Potassium: 3.3 mmol/L — ABNORMAL LOW (ref 3.5–5.1)
Sodium: 136 mmol/L (ref 135–145)
Total Bilirubin: 1.2 mg/dL (ref 0.0–1.2)
Total Protein: 9.1 g/dL — ABNORMAL HIGH (ref 6.5–8.1)

## 2024-03-22 LAB — CBC
HCT: 43 % (ref 36.0–46.0)
Hemoglobin: 14.7 g/dL (ref 12.0–15.0)
MCH: 29.4 pg (ref 26.0–34.0)
MCHC: 34.2 g/dL (ref 30.0–36.0)
MCV: 86 fL (ref 80.0–100.0)
Platelets: 333 K/uL (ref 150–400)
RBC: 5 MIL/uL (ref 3.87–5.11)
RDW: 13.2 % (ref 11.5–15.5)
WBC: 14.7 K/uL — ABNORMAL HIGH (ref 4.0–10.5)
nRBC: 0 % (ref 0.0–0.2)

## 2024-03-22 LAB — CBG MONITORING, ED: Glucose-Capillary: 176 mg/dL — ABNORMAL HIGH (ref 70–99)

## 2024-03-22 LAB — BETA-HYDROXYBUTYRIC ACID: Beta-Hydroxybutyric Acid: 1.7 mmol/L — ABNORMAL HIGH (ref 0.05–0.27)

## 2024-03-22 LAB — MAGNESIUM: Magnesium: 1.8 mg/dL (ref 1.7–2.4)

## 2024-03-22 LAB — HCG, SERUM, QUALITATIVE: Preg, Serum: NEGATIVE

## 2024-03-22 MED ORDER — ONDANSETRON HCL 4 MG/2ML IJ SOLN
4.0000 mg | Freq: Once | INTRAMUSCULAR | Status: AC
  Administered 2024-03-22: 4 mg via INTRAVENOUS
  Filled 2024-03-22: qty 2

## 2024-03-22 MED ORDER — DROPERIDOL 2.5 MG/ML IJ SOLN
1.2500 mg | Freq: Once | INTRAMUSCULAR | Status: AC
  Administered 2024-03-22: 1.25 mg via INTRAVENOUS
  Filled 2024-03-22: qty 2

## 2024-03-22 MED ORDER — DIPHENHYDRAMINE HCL 50 MG/ML IJ SOLN
12.5000 mg | Freq: Once | INTRAMUSCULAR | Status: AC
  Administered 2024-03-22: 12.5 mg via INTRAVENOUS
  Filled 2024-03-22: qty 1

## 2024-03-22 MED ORDER — METOCLOPRAMIDE HCL 5 MG PO TABS: 10.0000 mg | ORAL_TABLET | Freq: Three times a day (TID) | ORAL | 0 refills | Status: DC | PRN

## 2024-03-22 MED ORDER — LACTATED RINGERS IV BOLUS
2000.0000 mL | Freq: Once | INTRAVENOUS | Status: AC
  Administered 2024-03-22: 2000 mL via INTRAVENOUS

## 2024-03-22 MED ORDER — GLUCOSE BLOOD VI STRP: ORAL_STRIP | 12 refills | Status: AC

## 2024-03-22 MED ORDER — HYDROMORPHONE HCL 1 MG/ML IJ SOLN
0.5000 mg | Freq: Once | INTRAMUSCULAR | Status: AC
  Administered 2024-03-22: 0.5 mg via INTRAVENOUS
  Filled 2024-03-22: qty 1

## 2024-03-22 NOTE — ED Triage Notes (Signed)
 PT here POV for emesis and abdominal pain.  Type 1 diabetic.  Last took insulin  at 9 am.

## 2024-03-22 NOTE — ED Provider Notes (Signed)
 [facility] EMERGENCY DEPARTMENT AT [facility] Provider Note   CSN: [id number] Arrival date & time: 03/22/24  1222     Patient presents with: Emesis and Abdominal Pain (Diabetes t1)   Sabrina Sutton is a 28 y.o. female.   28 year old female with a history of insulin -dependent diabetes and marijuana use with cyclical vomiting presents emergency department nausea and vomiting.  Patient reports that yesterday she started spearing seeing nausea and vomiting.  Has had over 15 episodes of nonbloody nonbilious vomiting.  Also has had few episodes of diarrhea.  Feels similar to prior episodes of cyclical vomiting.  Did have a C-section no other abdominal surgeries.  No significant abdominal pain.       Prior to Admission medications   Medication Sig Start Date End Date Taking? Authorizing Provider  glucose blood test strip Use as instructed 03/22/24   Yolande Lamar BROCKS, MD  insulin  glargine (LANTUS ) 100 UNIT/ML injection Inject 0.25 mLs (25 Units total) into the skin daily. Patient taking differently: Inject 5 Units into the skin daily. 10/15/23   Acheampong, Peter K, MD  Insulin  Pen Needle 32G X 4 MM MISC Use to inject insulin  daily 11/17/22   Howell Lunger, DO  metoCLOPramide  (REGLAN ) 5 MG tablet Take 2 tablets (10 mg total) by mouth every 8 (eight) hours as needed for up to 15 days for nausea. 03/22/24 04/06/24  Yolande Lamar BROCKS, MD  NOVOLOG  FLEXPEN 100 UNIT/ML FlexPen INJECT 4 TO 10 UNITS UNDER THE SKIN THREE TIMES DAILY WITH MEALS IF GLUCOSE IS ABOVE 60 AND YOU ARE EATING 08/18/23   [name], DO  ondansetron  (ZOFRAN -ODT) 4 MG disintegrating tablet Take 1 tablet (4 mg total) by mouth every 8 (eight) hours as needed for nausea or vomiting. 01/27/24   Caleen Burgess BROCKS, MD  pantoprazole  (PROTONIX ) 20 MG tablet Take 1 tablet (20 mg total) by mouth daily before breakfast. 01/27/24 02/26/24  Caleen Burgess BROCKS, MD    Allergies: Patient has no known allergies.    Review of Systems  Updated  Vital Signs BP 131/79   Pulse 96   Temp 98.3 F (36.8 C) (Oral)   Resp 16   Ht 5' 7 (1.702 m)   Wt 59.1 kg   SpO2 100%   BMI 20.41 kg/m   Physical Exam Constitutional:      General: She is not in acute distress.    Appearance: She is ill-appearing.     Comments: Uncomfortable appearing holding emesis basin  HENT:     Mouth/Throat:     Mouth: Mucous membranes are dry.     Pharynx: Oropharynx is clear.  Eyes:     Extraocular Movements: Extraocular movements intact.     Conjunctiva/sclera: Conjunctivae normal.     Pupils: Pupils are equal, round, and reactive to light.  Cardiovascular:     Rate and Rhythm: Normal rate and regular rhythm.  Abdominal:     General: There is no distension.     Palpations: There is no mass.     Tenderness: There is no abdominal tenderness. There is no guarding.  Neurological:     Mental Status: She is alert.     (all labs ordered are listed, but only abnormal results are displayed) Labs Reviewed  CBC - Abnormal; Notable for the following components:      Result Value   WBC 14.7 (*)    All other components within normal limits  COMPREHENSIVE METABOLIC PANEL WITH GFR - Abnormal; Notable for the following components:  Potassium 3.3 (*)    Chloride 91 (*)    Glucose, Bld 171 (*)    Creatinine, Ser 1.07 (*)    Calcium  10.5 (*)    Total Protein 9.1 (*)    Albumin 5.2 (*)    Anion gap 20 (*)    All other components within normal limits  BETA-HYDROXYBUTYRIC ACID - Abnormal; Notable for the following components:   Beta-Hydroxybutyric Acid 1.70 (*)    All other components within normal limits  CBG MONITORING, ED - Abnormal; Notable for the following components:   Glucose-Capillary 176 (*)    All other components within normal limits  I-STAT VENOUS BLOOD GAS, ED - Abnormal; Notable for the following components:   pH, Ven 7.517 (*)    pCO2, Ven 39.0 (*)    pO2, Ven 70 (*)    Bicarbonate 31.6 (*)    TCO2 33 (*)    Acid-Base Excess 8.0 (*)     Sodium 132 (*)    Calcium , Ion 1.00 (*)    Hemoglobin 15.6 (*)    All other components within normal limits  I-STAT CG4 LACTIC ACID, ED - Abnormal; Notable for the following components:   Lactic Acid, Venous 3.7 (*)    All other components within normal limits  HCG, SERUM, QUALITATIVE  MAGNESIUM   URINALYSIS, ROUTINE W REFLEX MICROSCOPIC  RAPID URINE DRUG SCREEN, HOSP PERFORMED  I-STAT CG4 LACTIC ACID, ED  CBG MONITORING, ED  I-STAT CG4 LACTIC ACID, ED    EKG: EKG Interpretation Date/Time:  Saturday March 22 2024 13:43:32 EDT Ventricular Rate:  74 PR Interval:  92 QRS Duration:  76 QT Interval:  390 QTC Calculation: 433 R Axis:   73  Text Interpretation: Sinus rhythm Short PR interval Confirmed by Yolande Charleston [phone]) on 03/22/2024 2:25:48 PM  Radiology: No results found.   Procedures   Medications Ordered in the ED  lactated ringers  bolus 2,000 mL (0 mLs Intravenous Stopped 03/22/24 1550)  droperidol  (INAPSINE ) 2.5 MG/ML injection 1.25 mg (1.25 mg Intravenous Given 03/22/24 1403)  diphenhydrAMINE  (BENADRYL ) injection 12.5 mg (12.5 mg Intravenous Given 03/22/24 1405)  HYDROmorphone  (DILAUDID ) injection 0.5 mg (0.5 mg Intravenous Given 03/22/24 1437)  ondansetron  (ZOFRAN ) injection 4 mg (4 mg Intravenous Given 03/22/24 1437)                                    Medical Decision Making Amount and/or Complexity of Data Reviewed Labs: ordered.  Risk Prescription drug management.   Sabrina Sutton is a 28 year old female with a history of insulin -dependent diabetes and marijuana use with cyclical vomiting presents emergency department nausea and vomiting.  Initial Ddx:  Cannabinoid hyperemesis, diabetic gastroparesis, cyclical vomiting, gastroenteritis, gastritis, food poisoning, pancreatitis, hyperemesis, appendicitis, cholecystitis, DKA  MDM:  Patient presents to the emergency department with nausea and vomiting.  Also has had small bowel movement with  diarrhea.  Does have a history of cyclical vomiting and she feels that this is similar to the prior episodes.  No significant abdominal tenderness to palpation on exam.  Suspect that she likely of her cyclical vomiting.  Will obtain lipase to evaluate for pancreatitis but feel this is less likely as well.  Smokes marijuana still.  No significant tenderness to palpation in right upper quadrant or right lower quadrant that would suggest cholecystitis or appendicitis.  Plan:  Labs IV fluids Droperidol  EKG to assess QTc  ED Summary/Re-evaluation:  Lab work obtained and  did show a mild leukocytosis which I suspect is due to stress.  Potassium 3.3 but magnesium  was WNL.  Did have mildly elevated lactic acid at 3.7 which I stated dehydration.  No signs of DKA.  She was given 2 L of fluids.  Given droperidol  and Zofran  and was able to tolerate p.o.  She is feeling much better at this point in time.  Discharged home with prescription of Reglan  and retractions follow-up with her primary doctor in several days.  This patient presents to the ED for concern of complaints listed in HPI, this involves an extensive number of treatment options, and is a complaint that carries with it a high risk of complications and morbidity. Disposition including potential need for admission considered.   Dispo: DC Home. Return precautions discussed including, but not limited to, those listed in the AVS. Allowed pt time to ask questions which were answered fully prior to dc.  Records reviewed Outpatient Clinic Notes The following labs were independently interpreted: Chemistry and show no acute abnormality I personally reviewed and interpreted cardiac monitoring: normal sinus rhythm  I personally reviewed and interpreted the pt's EKG: see above for interpretation  I have reviewed the patients home medications and made adjustments as needed   Final diagnoses:  Cyclical vomiting    ED Discharge Orders          Ordered     glucose blood test strip       Note to Pharmacy: Accucheck guide   03/22/24 1549    metoCLOPramide  (REGLAN ) 5 MG tablet  Every 8 hours PRN        03/22/24 1549               Yolande Lamar BROCKS, MD 03/22/24 AMOS

## 2024-03-22 NOTE — Discharge Instructions (Addendum)
 You were seen for your nausea and vomiting in the emergency department.  At home, please take the Reglan  for your nausea and vomiting. Please be sure to stay well-hydrated.  Stop smoking marijuana or using THC related products because they can make your symptoms worse.  We sent additional test trips to your pharmacy  Follow-up with your primary doctor in 2-3 days regarding your visit.  Return immediately to the emergency department if you experience any of the following: fainting, abdominal pain, high fevers, or any other concerning symptoms.  Thank you for visiting our Emergency Department. It was a pleasure taking care of you today.

## 2024-04-05 ENCOUNTER — Inpatient Hospital Stay (HOSPITAL_COMMUNITY)
Admission: EM | Admit: 2024-04-05 | Discharge: 2024-04-07 | DRG: 637 | Disposition: A | Discharge status: Home / Self Care | Attending: Internal Medicine | Admitting: Internal Medicine

## 2024-04-05 ENCOUNTER — Other Ambulatory Visit: Payer: Self-pay

## 2024-04-05 ENCOUNTER — Encounter (HOSPITAL_COMMUNITY): Payer: Self-pay

## 2024-04-05 DIAGNOSIS — B2700 Gammaherpesviral mononucleosis without complication: Secondary | ICD-10-CM | POA: Diagnosis present

## 2024-04-05 DIAGNOSIS — Z1152 Encounter for screening for COVID-19: Secondary | ICD-10-CM

## 2024-04-05 DIAGNOSIS — E871 Hypo-osmolality and hyponatremia: Secondary | ICD-10-CM | POA: Diagnosis not present

## 2024-04-05 DIAGNOSIS — N179 Acute kidney failure, unspecified: Secondary | ICD-10-CM | POA: Diagnosis present

## 2024-04-05 DIAGNOSIS — Z91419 Personal history of unspecified adult abuse: Secondary | ICD-10-CM

## 2024-04-05 DIAGNOSIS — E1010 Type 1 diabetes mellitus with ketoacidosis without coma: Principal | ICD-10-CM | POA: Diagnosis present

## 2024-04-05 DIAGNOSIS — R6510 Systemic inflammatory response syndrome (SIRS) of non-infectious origin without acute organ dysfunction: Secondary | ICD-10-CM | POA: Diagnosis present

## 2024-04-05 DIAGNOSIS — F331 Major depressive disorder, recurrent, moderate: Secondary | ICD-10-CM | POA: Diagnosis present

## 2024-04-05 DIAGNOSIS — R17 Unspecified jaundice: Secondary | ICD-10-CM | POA: Diagnosis present

## 2024-04-05 DIAGNOSIS — Z8249 Family history of ischemic heart disease and other diseases of the circulatory system: Secondary | ICD-10-CM

## 2024-04-05 DIAGNOSIS — Z794 Long term (current) use of insulin: Secondary | ICD-10-CM

## 2024-04-05 DIAGNOSIS — Z87891 Personal history of nicotine dependence: Secondary | ICD-10-CM

## 2024-04-05 DIAGNOSIS — Z833 Family history of diabetes mellitus: Secondary | ICD-10-CM

## 2024-04-05 DIAGNOSIS — E872 Acidosis, unspecified: Secondary | ICD-10-CM | POA: Diagnosis present

## 2024-04-05 DIAGNOSIS — R112 Nausea with vomiting, unspecified: Secondary | ICD-10-CM | POA: Diagnosis not present

## 2024-04-05 DIAGNOSIS — E876 Hypokalemia: Secondary | ICD-10-CM | POA: Diagnosis present

## 2024-04-05 DIAGNOSIS — Z8619 Personal history of other infectious and parasitic diseases: Secondary | ICD-10-CM | POA: Diagnosis not present

## 2024-04-05 DIAGNOSIS — E860 Dehydration: Secondary | ICD-10-CM | POA: Diagnosis present

## 2024-04-05 DIAGNOSIS — Z79899 Other long term (current) drug therapy: Secondary | ICD-10-CM

## 2024-04-05 DIAGNOSIS — R6511 Systemic inflammatory response syndrome (SIRS) of non-infectious origin with acute organ dysfunction: Secondary | ICD-10-CM | POA: Diagnosis present

## 2024-04-05 LAB — I-STAT VENOUS BLOOD GAS, ED
Acid-base deficit: 4 mmol/L — ABNORMAL HIGH (ref 0.0–2.0)
Bicarbonate: 19.8 mmol/L — ABNORMAL LOW (ref 20.0–28.0)
Calcium, Ion: 0.96 mmol/L — ABNORMAL LOW (ref 1.15–1.40)
HCT: 45 % (ref 36.0–46.0)
Hemoglobin: 15.3 g/dL — ABNORMAL HIGH (ref 12.0–15.0)
O2 Saturation: 100 %
Potassium: 4.4 mmol/L (ref 3.5–5.1)
Sodium: 120 mmol/L — ABNORMAL LOW (ref 135–145)
TCO2: 21 mmol/L — ABNORMAL LOW (ref 22–32)
pCO2, Ven: 31.9 mmHg — ABNORMAL LOW (ref 44–60)
pH, Ven: 7.402 (ref 7.25–7.43)
pO2, Ven: 168 mmHg — ABNORMAL HIGH (ref 32–45)

## 2024-04-05 LAB — I-STAT CHEM 8, ED
BUN: 28 mg/dL — ABNORMAL HIGH (ref 6–20)
Calcium, Ion: 0.97 mmol/L — ABNORMAL LOW (ref 1.15–1.40)
Chloride: 86 mmol/L — ABNORMAL LOW (ref 98–111)
Creatinine, Ser: 1.1 mg/dL — ABNORMAL HIGH (ref 0.44–1.00)
Glucose, Bld: 604 mg/dL (ref 70–99)
HCT: 46 % (ref 36.0–46.0)
Hemoglobin: 15.6 g/dL — ABNORMAL HIGH (ref 12.0–15.0)
Potassium: 4.3 mmol/L (ref 3.5–5.1)
Sodium: 121 mmol/L — ABNORMAL LOW (ref 135–145)
TCO2: 20 mmol/L — ABNORMAL LOW (ref 22–32)

## 2024-04-05 LAB — BASIC METABOLIC PANEL WITH GFR
Anion gap: 14 (ref 5–15)
Anion gap: 15 (ref 5–15)
Anion gap: 10 (ref 5–15)
BUN: 16 mg/dL (ref 6–20)
BUN: 13 mg/dL (ref 6–20)
BUN: 11 mg/dL (ref 6–20)
CO2: 20 mmol/L — ABNORMAL LOW (ref 22–32)
CO2: 18 mmol/L — ABNORMAL LOW (ref 22–32)
CO2: 21 mmol/L — ABNORMAL LOW (ref 22–32)
Calcium: 8.4 mg/dL — ABNORMAL LOW (ref 8.9–10.3)
Calcium: 8.2 mg/dL — ABNORMAL LOW (ref 8.9–10.3)
Calcium: 8 mg/dL — ABNORMAL LOW (ref 8.9–10.3)
Chloride: 96 mmol/L — ABNORMAL LOW (ref 98–111)
Chloride: 96 mmol/L — ABNORMAL LOW (ref 98–111)
Chloride: 100 mmol/L (ref 98–111)
Creatinine, Ser: 1.18 mg/dL — ABNORMAL HIGH (ref 0.44–1.00)
Creatinine, Ser: 0.92 mg/dL (ref 0.44–1.00)
Creatinine, Ser: 0.87 mg/dL (ref 0.44–1.00)
GFR, Estimated: >60 mL/min (ref >60)
GFR, Estimated: >60 mL/min (ref >60)
GFR, Estimated: >60 mL/min (ref >60)
Glucose, Bld: 278 mg/dL — ABNORMAL HIGH (ref 70–99)
Glucose, Bld: 186 mg/dL — ABNORMAL HIGH (ref 70–99)
Glucose, Bld: 139 mg/dL — ABNORMAL HIGH (ref 70–99)
Potassium: 3.5 mmol/L (ref 3.5–5.1)
Potassium: 3.9 mmol/L (ref 3.5–5.1)
Potassium: 3.1 mmol/L — ABNORMAL LOW (ref 3.5–5.1)
Sodium: 130 mmol/L — ABNORMAL LOW (ref 135–145)
Sodium: 129 mmol/L — ABNORMAL LOW (ref 135–145)
Sodium: 131 mmol/L — ABNORMAL LOW (ref 135–145)

## 2024-04-05 LAB — URINALYSIS, ROUTINE W REFLEX MICROSCOPIC
Bilirubin Urine: NEGATIVE
Glucose, UA: >=500 mg/dL — AB
Hgb urine dipstick: NEGATIVE
Ketones, ur: 80 mg/dL — AB
Leukocytes,Ua: NEGATIVE
Nitrite: NEGATIVE
Protein, ur: NEGATIVE mg/dL
Specific Gravity, Urine: 1.023 (ref 1.005–1.030)
pH: 5 (ref 5.0–8.0)

## 2024-04-05 LAB — COMPREHENSIVE METABOLIC PANEL WITH GFR
ALT: 19 U/L (ref 0–44)
AST: 25 U/L (ref 15–41)
Albumin: 4.2 g/dL (ref 3.5–5.0)
Alkaline Phosphatase: 96 U/L (ref 38–126)
Anion gap: 30 — ABNORMAL HIGH (ref 5–15)
BUN: 24 mg/dL — ABNORMAL HIGH (ref 6–20)
CO2: 14 mmol/L — ABNORMAL LOW (ref 22–32)
Calcium: 9.3 mg/dL (ref 8.9–10.3)
Chloride: 79 mmol/L — ABNORMAL LOW (ref 98–111)
Creatinine, Ser: 1.5 mg/dL — ABNORMAL HIGH (ref 0.44–1.00)
GFR, Estimated: 49 mL/min — ABNORMAL LOW (ref >60)
Glucose, Bld: 535 mg/dL (ref 70–99)
Potassium: 4.3 mmol/L (ref 3.5–5.1)
Sodium: 123 mmol/L — ABNORMAL LOW (ref 135–145)
Total Bilirubin: 1.6 mg/dL — ABNORMAL HIGH (ref 0.0–1.2)
Total Protein: 7.7 g/dL (ref 6.5–8.1)

## 2024-04-05 LAB — CBG MONITORING, ED
Glucose-Capillary: 554 mg/dL (ref 70–99)
Glucose-Capillary: 505 mg/dL (ref 70–99)
Glucose-Capillary: 567 mg/dL (ref 70–99)
Glucose-Capillary: 395 mg/dL — ABNORMAL HIGH (ref 70–99)
Glucose-Capillary: 430 mg/dL — ABNORMAL HIGH (ref 70–99)
Glucose-Capillary: 332 mg/dL — ABNORMAL HIGH (ref 70–99)
Glucose-Capillary: 305 mg/dL — ABNORMAL HIGH (ref 70–99)
Glucose-Capillary: 230 mg/dL — ABNORMAL HIGH (ref 70–99)

## 2024-04-05 LAB — BETA-HYDROXYBUTYRIC ACID: Beta-Hydroxybutyric Acid: >8 mmol/L — ABNORMAL HIGH (ref 0.05–0.27)

## 2024-04-05 LAB — RESP PANEL BY RT-PCR (RSV, FLU A&B, COVID)  RVPGX2
Influenza A by PCR: NEGATIVE
Influenza B by PCR: NEGATIVE
Resp Syncytial Virus by PCR: NEGATIVE
SARS Coronavirus 2 by RT PCR: NEGATIVE

## 2024-04-05 LAB — I-STAT CG4 LACTIC ACID, ED
Lactic Acid, Venous: 3.9 mmol/L (ref 0.5–1.9)
Lactic Acid, Venous: 2.5 mmol/L (ref 0.5–1.9)

## 2024-04-05 LAB — LACTIC ACID, PLASMA
Lactic Acid, Venous: 2 mmol/L (ref 0.5–1.9)
Lactic Acid, Venous: 2.6 mmol/L (ref 0.5–1.9)
Lactic Acid, Venous: 1.1 mmol/L (ref 0.5–1.9)

## 2024-04-05 LAB — MAGNESIUM: Magnesium: 1.8 mg/dL (ref 1.7–2.4)

## 2024-04-05 LAB — CBC
HCT: 39.5 % (ref 36.0–46.0)
Hemoglobin: 13.6 g/dL (ref 12.0–15.0)
MCH: 29.4 pg (ref 26.0–34.0)
MCHC: 34.4 g/dL (ref 30.0–36.0)
MCV: 85.5 fL (ref 80.0–100.0)
Platelets: 394 K/uL (ref 150–400)
RBC: 4.62 MIL/uL (ref 3.87–5.11)
RDW: 14 % (ref 11.5–15.5)
WBC: 24 K/uL — ABNORMAL HIGH (ref 4.0–10.5)
nRBC: 0 % (ref 0.0–0.2)

## 2024-04-05 LAB — GLUCOSE, CAPILLARY
Glucose-Capillary: 137 mg/dL — ABNORMAL HIGH (ref 70–99)
Glucose-Capillary: 145 mg/dL — ABNORMAL HIGH (ref 70–99)
Glucose-Capillary: 161 mg/dL — ABNORMAL HIGH (ref 70–99)
Glucose-Capillary: 168 mg/dL — ABNORMAL HIGH (ref 70–99)
Glucose-Capillary: 191 mg/dL — ABNORMAL HIGH (ref 70–99)
Glucose-Capillary: 199 mg/dL — ABNORMAL HIGH (ref 70–99)
Glucose-Capillary: 201 mg/dL — ABNORMAL HIGH (ref 70–99)

## 2024-04-05 LAB — HCG, SERUM, QUALITATIVE: Preg, Serum: NEGATIVE

## 2024-04-05 LAB — LIPASE, BLOOD: Lipase: 22 U/L (ref 11–51)

## 2024-04-05 LAB — C-REACTIVE PROTEIN: CRP: 0.6 mg/dL (ref <1.0)

## 2024-04-05 MED ORDER — ONDANSETRON HCL 4 MG/2ML IJ SOLN
4.0000 mg | Freq: Once | INTRAMUSCULAR | Status: AC
  Administered 2024-04-05: 4 mg via INTRAVENOUS
  Filled 2024-04-05: qty 2

## 2024-04-05 MED ORDER — POTASSIUM CHLORIDE 10 MEQ/100ML IV SOLN
10.0000 meq | INTRAVENOUS | Status: AC
  Administered 2024-04-05 (×2): 10 meq via INTRAVENOUS
  Filled 2024-04-05 (×2): qty 100

## 2024-04-05 MED ORDER — SODIUM CHLORIDE 0.9 % IV BOLUS
1000.0000 mL | Freq: Once | INTRAVENOUS | Status: AC
  Administered 2024-04-05: 1000 mL via INTRAVENOUS

## 2024-04-05 MED ORDER — LACTATED RINGERS IV SOLN: INTRAVENOUS | Status: DC

## 2024-04-05 MED ORDER — INSULIN ASPART 100 UNIT/ML IJ SOLN
0.0000 [IU] | Freq: Three times a day (TID) | INTRAMUSCULAR | Status: DC
  Administered 2024-04-06: 2 [IU] via SUBCUTANEOUS
  Administered 2024-04-06: 1 [IU] via SUBCUTANEOUS
  Administered 2024-04-06 – 2024-04-07 (×2): 2 [IU] via SUBCUTANEOUS

## 2024-04-05 MED ORDER — METOCLOPRAMIDE HCL 5 MG/ML IJ SOLN
10.0000 mg | Freq: Three times a day (TID) | INTRAMUSCULAR | Status: DC
  Administered 2024-04-05 – 2024-04-06 (×4): 10 mg via INTRAVENOUS
  Filled 2024-04-05 (×5): qty 2

## 2024-04-05 MED ORDER — POTASSIUM CHLORIDE 10 MEQ/100ML IV SOLN
10.0000 meq | INTRAVENOUS | Status: AC
  Administered 2024-04-06 (×4): 10 meq via INTRAVENOUS
  Filled 2024-04-05 (×4): qty 100

## 2024-04-05 MED ORDER — ENOXAPARIN SODIUM 40 MG/0.4ML IJ SOSY
40.0000 mg | PREFILLED_SYRINGE | INTRAMUSCULAR | Status: DC
  Administered 2024-04-05 – 2024-04-06 (×2): 40 mg via SUBCUTANEOUS
  Filled 2024-04-05 (×2): qty 0.4

## 2024-04-05 MED ORDER — INSULIN GLARGINE 100 UNIT/ML ~~LOC~~ SOLN
25.0000 [IU] | Freq: Every day | SUBCUTANEOUS | Status: DC
Start: 2024-04-06 — End: 2024-04-06
  Administered 2024-04-06: 25 [IU] via SUBCUTANEOUS
  Filled 2024-04-05 (×2): qty 0.25

## 2024-04-05 MED ORDER — DEXTROSE 50 % IV SOLN: 0.0000 mL | INTRAVENOUS | Status: DC | PRN

## 2024-04-05 MED ORDER — INFLUENZA VIRUS VACC SPLIT PF (FLUZONE) 0.5 ML IM SUSY
0.5000 mL | PREFILLED_SYRINGE | INTRAMUSCULAR | Status: DC
Start: 2024-04-06 — End: 2024-04-07
  Filled 2024-04-05: qty 0.5

## 2024-04-05 MED ORDER — INSULIN ASPART 100 UNIT/ML IJ SOLN: 0.0000 [IU] | Freq: Every day | INTRAMUSCULAR | Status: DC

## 2024-04-05 MED ORDER — DEXTROSE IN LACTATED RINGERS 5 % IV SOLN: INTRAVENOUS | Status: DC

## 2024-04-05 MED ORDER — INSULIN REGULAR(HUMAN) IN NACL 100-0.9 UT/100ML-% IV SOLN
INTRAVENOUS | Status: DC
  Administered 2024-04-05: 5.5 [IU]/h via INTRAVENOUS
  Filled 2024-04-05: qty 100

## 2024-04-05 MED ORDER — PANTOPRAZOLE SODIUM 40 MG IV SOLR: 40.0000 mg | INTRAVENOUS | Status: DC

## 2024-04-05 MED ORDER — PANTOPRAZOLE SODIUM 40 MG IV SOLR
40.0000 mg | Freq: Once | INTRAVENOUS | Status: AC
  Administered 2024-04-05: 40 mg via INTRAVENOUS
  Filled 2024-04-05: qty 10

## 2024-04-05 MED ORDER — BUTALBITAL-APAP-CAFFEINE 50-325-40 MG PO TABS
1.0000 | ORAL_TABLET | Freq: Four times a day (QID) | ORAL | Status: AC | PRN
  Administered 2024-04-05: 1 via ORAL
  Filled 2024-04-05: qty 1

## 2024-04-05 MED ORDER — SODIUM CHLORIDE 0.9 % IV BOLUS
500.0000 mL | Freq: Once | INTRAVENOUS | Status: AC
  Administered 2024-04-05: 500 mL via INTRAVENOUS

## 2024-04-05 MED ORDER — ONDANSETRON HCL 4 MG/2ML IJ SOLN
4.0000 mg | Freq: Four times a day (QID) | INTRAMUSCULAR | Status: DC | PRN
  Administered 2024-04-06 – 2024-04-07 (×3): 4 mg via INTRAVENOUS
  Filled 2024-04-05 (×4): qty 2

## 2024-04-05 MED ORDER — INSULIN REGULAR(HUMAN) IN NACL 100-0.9 UT/100ML-% IV SOLN: INTRAVENOUS | Status: DC

## 2024-04-05 MED ORDER — PANTOPRAZOLE SODIUM 40 MG IV SOLR: 40.0000 mg | Freq: Two times a day (BID) | INTRAVENOUS | Status: DC
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
## 2024-04-05 NOTE — ED Notes (Signed)
Pt ambulatory to the restroom with 1 staff assist. 

## 2024-04-05 NOTE — ED Provider Notes (Signed)
 [address] EMERGENCY DEPARTMENT AT [facility] Provider Note   CSN: [id number] Arrival date & time: 04/05/24  9283     Patient presents with: Hyperglycemia   Sabrina Sutton is a 28 y.o. female.  She is up 1 diabetic.  She has had some problems with her blood glucose monitoring and so has not been sure how much to dose herself with insulin  for the last few days.  Complaining of fatigue nausea vomiting and lower back pain for 3 days.  She is not sure if she has had a fever but has felt hot and cold.  Minimal cough.  No urinary symptoms.  Last menstrual period was 3 weeks ago.  She has been seen in the ED before and has had admissions for persistent nausea vomiting, cyclic vomiting.  Also has had DKA.  She does endorse marijuana.   The history is provided by the patient.  Hyperglycemia Blood sugar level PTA:  500 Severity:  Severe Onset quality:  Unable to specify Chronicity:  Recurrent Diabetes status:  Controlled with insulin  Associated symptoms: dehydration, fatigue, nausea, vomiting and weakness   Associated symptoms: no abdominal pain, no chest pain, no dysuria, no fever, no shortness of breath and no syncope   Risk factors: hx of DKA        Prior to Admission medications   Medication Sig Start Date End Date Taking? Authorizing Provider  glucose blood test strip Use as instructed 03/22/24   Yolande Lamar BROCKS, MD  insulin  glargine (LANTUS ) 100 UNIT/ML injection Inject 0.25 mLs (25 Units total) into the skin daily. Patient taking differently: Inject 5 Units into the skin daily. 10/15/23   Acheampong, Maude POUR, MD  Insulin  Pen Needle 32G X 4 MM MISC Use to inject insulin  daily 11/17/22   Howell Lunger, DO  metoCLOPramide  (REGLAN ) 5 MG tablet Take 2 tablets (10 mg total) by mouth every 8 (eight) hours as needed for up to 15 days for nausea. 03/22/24 04/06/24  Yolande Lamar BROCKS, MD  NOVOLOG  FLEXPEN 100 UNIT/ML FlexPen INJECT 4 TO 10 UNITS UNDER THE SKIN THREE TIMES DAILY WITH  MEALS IF GLUCOSE IS ABOVE 60 AND YOU ARE EATING 08/18/23   [name], DO  ondansetron  (ZOFRAN -ODT) 4 MG disintegrating tablet Take 1 tablet (4 mg total) by mouth every 8 (eight) hours as needed for nausea or vomiting. 01/27/24   Caleen Burgess BROCKS, MD  pantoprazole  (PROTONIX ) 20 MG tablet Take 1 tablet (20 mg total) by mouth daily before breakfast. 01/27/24 02/26/24  Caleen Burgess BROCKS, MD    Allergies: Patient has no known allergies.    Review of Systems  Constitutional:  Positive for fatigue. Negative for fever.  Eyes:  Negative for visual disturbance.  Respiratory:  Negative for shortness of breath.   Cardiovascular:  Negative for chest pain and syncope.  Gastrointestinal:  Positive for nausea and vomiting. Negative for abdominal pain.  Genitourinary:  Negative for dysuria.  Musculoskeletal:  Positive for back pain.  Neurological:  Positive for weakness.    Updated Vital Signs BP (!) 144/88 (BP Location: Left Arm)   Pulse (!) 138   Temp 98.5 F (36.9 C)   Resp 20   SpO2 100%   Physical Exam Vitals and nursing note reviewed.  Constitutional:      General: She is not in acute distress.    Appearance: Normal appearance. She is well-developed.  HENT:     Head: Normocephalic and atraumatic.  Eyes:     Conjunctiva/sclera: Conjunctivae normal.  Cardiovascular:  Rate and Rhythm: Regular rhythm. Tachycardia present.     Heart sounds: No murmur heard. Pulmonary:     Effort: Pulmonary effort is normal. No respiratory distress.     Breath sounds: Normal breath sounds. No stridor. No wheezing.  Abdominal:     Palpations: Abdomen is soft.     Tenderness: There is no abdominal tenderness. There is no guarding or rebound.  Musculoskeletal:        General: No tenderness or deformity.     Cervical back: Neck supple.  Skin:    General: Skin is warm and dry.  Neurological:     General: No focal deficit present.     Mental Status: She is alert.     GCS: GCS eye subscore is 4. GCS verbal  subscore is 5. GCS motor subscore is 6.     Sensory: No sensory deficit.     Motor: No weakness.     (all labs ordered are listed, but only abnormal results are displayed) Labs Reviewed  CBC - Abnormal; Notable for the following components:      Result Value   WBC 24.0 (*)    All other components within normal limits  URINALYSIS, ROUTINE W REFLEX MICROSCOPIC - Abnormal; Notable for the following components:   APPearance CLOUDY (*)    Glucose, UA >=500 (*)    Ketones, ur 80 (*)    Bacteria, UA RARE (*)    All other components within normal limits  COMPREHENSIVE METABOLIC PANEL WITH GFR - Abnormal; Notable for the following components:   Sodium 123 (*)    Chloride 79 (*)    CO2 14 (*)    Glucose, Bld 535 (*)    BUN 24 (*)    Creatinine, Ser 1.50 (*)    Total Bilirubin 1.6 (*)    GFR, Estimated 49 (*)    Anion gap 30 (*)    All other components within normal limits  BETA-HYDROXYBUTYRIC ACID - Abnormal; Notable for the following components:   Beta-Hydroxybutyric Acid >8.00 (*)    All other components within normal limits  LACTIC ACID, PLASMA - Abnormal; Notable for the following components:   Lactic Acid, Venous 2.0 (*)    All other components within normal limits  LACTIC ACID, PLASMA - Abnormal; Notable for the following components:   Lactic Acid, Venous 2.6 (*)    All other components within normal limits  BASIC METABOLIC PANEL WITH GFR - Abnormal; Notable for the following components:   Sodium 130 (*)    Chloride 96 (*)    CO2 20 (*)    Glucose, Bld 278 (*)    Creatinine, Ser 1.18 (*)    Calcium  8.4 (*)    All other components within normal limits  CBG MONITORING, ED - Abnormal; Notable for the following components:   Glucose-Capillary 554 (*)    All other components within normal limits  CBG MONITORING, ED - Abnormal; Notable for the following components:   Glucose-Capillary 567 (*)    All other components within normal limits  I-STAT VENOUS BLOOD GAS, ED -  Abnormal; Notable for the following components:   pCO2, Ven 31.9 (*)    pO2, Ven 168 (*)    Bicarbonate 19.8 (*)    TCO2 21 (*)    Acid-base deficit 4.0 (*)    Sodium 120 (*)    Calcium , Ion 0.96 (*)    Hemoglobin 15.3 (*)    All other components within normal limits  I-STAT CG4 LACTIC ACID, ED -  Abnormal; Notable for the following components:   Lactic Acid, Venous 3.9 (*)    All other components within normal limits  I-STAT CHEM 8, ED - Abnormal; Notable for the following components:   Sodium 121 (*)    Chloride 86 (*)    BUN 28 (*)    Creatinine, Ser 1.10 (*)    Glucose, Bld 604 (*)    Calcium , Ion 0.97 (*)    TCO2 20 (*)    Hemoglobin 15.6 (*)    All other components within normal limits  I-STAT CG4 LACTIC ACID, ED - Abnormal; Notable for the following components:   Lactic Acid, Venous 2.5 (*)    All other components within normal limits  CBG MONITORING, ED - Abnormal; Notable for the following components:   Glucose-Capillary 505 (*)    All other components within normal limits  CBG MONITORING, ED - Abnormal; Notable for the following components:   Glucose-Capillary 430 (*)    All other components within normal limits  CBG MONITORING, ED - Abnormal; Notable for the following components:   Glucose-Capillary 395 (*)    All other components within normal limits  CBG MONITORING, ED - Abnormal; Notable for the following components:   Glucose-Capillary 332 (*)    All other components within normal limits  CBG MONITORING, ED - Abnormal; Notable for the following components:   Glucose-Capillary 305 (*)    All other components within normal limits  CBG MONITORING, ED - Abnormal; Notable for the following components:   Glucose-Capillary 230 (*)    All other components within normal limits  RESP PANEL BY RT-PCR (RSV, FLU A&B, COVID)  RVPGX2  CULTURE, BLOOD (ROUTINE X 2)  CULTURE, BLOOD (ROUTINE X 2)  HCG, SERUM, QUALITATIVE  LIPASE, BLOOD  MAGNESIUM   BASIC METABOLIC PANEL  WITH GFR  BASIC METABOLIC PANEL WITH GFR  C-REACTIVE PROTEIN    EKG: None  Radiology: No results found.   .Critical Care  Performed by: Towana Ozell BROCKS, MD Authorized by: Towana Ozell BROCKS, MD   Critical care provider statement:    Critical care time (minutes):  45   Critical care time was exclusive of:  Separately billable procedures and treating other patients   Critical care was necessary to treat or prevent imminent or life-threatening deterioration of the following conditions:  Endocrine crisis and metabolic crisis   Critical care was time spent personally by me on the following activities:  Development of treatment plan with patient or surrogate, discussions with consultants, evaluation of patient's response to treatment, examination of patient, obtaining history from patient or surrogate, ordering and performing treatments and interventions, ordering and review of laboratory studies, ordering and review of radiographic studies, pulse oximetry and re-evaluation of patient's condition   I assumed direction of critical care for this patient from another provider in my specialty: no      Medications Ordered in the ED  insulin  regular, human (MYXREDLIN ) 100 units/ 100 mL infusion (2.2 Units/hr Intravenous Rate/Dose Change 04/05/24 1632)  lactated ringers  infusion (0 mLs Intravenous Stopped 04/05/24 1537)  dextrose  5 % in lactated ringers  infusion ( Intravenous New Bag/Given 04/05/24 1538)  dextrose  50 % solution 0-50 mL (has no administration in time range)  enoxaparin  (LOVENOX ) injection 40 mg (40 mg Subcutaneous Given 04/05/24 1227)  ondansetron  (ZOFRAN ) injection 4 mg (has no administration in time range)  pantoprazole  (PROTONIX ) injection 40 mg (has no administration in time range)  metoCLOPramide  (REGLAN ) injection 10 mg (10 mg Intravenous Given 04/05/24 1450)  sodium  chloride 0.9 % bolus 500 mL (has no administration in time range)  sodium chloride  0.9 % bolus 1,000 mL (0 mLs  Intravenous Stopped 04/05/24 1006)  ondansetron  (ZOFRAN ) injection 4 mg (4 mg Intravenous Given 04/05/24 0903)  pantoprazole  (PROTONIX ) injection 40 mg (40 mg Intravenous Given 04/05/24 0905)  sodium chloride  0.9 % bolus 1,000 mL (0 mLs Intravenous Stopped 04/05/24 1226)  potassium chloride  10 mEq in 100 mL IVPB (0 mEq Intravenous Stopped 04/05/24 1330)    Clinical Course as of 04/05/24 1642  Sat Apr 05, 2024  0845 Patient has difficult IV access so IV team is evaluating her now. [MB]  [id number] I-STAT back with elevated lactic acid of 3.9.  pH normal.  Sodium low at 121.  Glucose elevated at 604. [MB]    Clinical Course User Index [MB] Towana Ozell BROCKS, MD                                 Medical Decision Making Amount and/or Complexity of Data Reviewed Labs: ordered.  Risk Prescription drug management. Decision regarding hospitalization.   This patient complains of nausea and vomiting elevated blood sugar; this involves an extensive number of treatment Options and is a complaint that carries with it a high risk of complications and morbidity. The differential includes DKA, dehydration, metabolic derangement, hyperemesis, infection  I ordered, reviewed and interpreted labs, which included CBC with markedly elevated white count, chemistries with low sodium low bicarb elevated glucose elevated creatinine, beta hydroxybutyrate elevated, VBG with normal pH, lactate elevated, urinalysis with ketones, pregnancy negative I ordered medication IV fluids IV insulin  nausea medication PPI and reviewed PMP when indicated. Previous records obtained and reviewed in epic including recent discharge summary and ED notes I consulted [facility]  hospitalist Dr. Claudene and discussed lab and imaging findings and discussed disposition.  Cardiac monitoring reviewed, sinus tachycardia Social determinants considered, social isolation Critical Interventions: Initiation of IV insulin  and fluids for patient's early  DKA  After the interventions stated above, I reevaluated the patient and found patient still to be with normal sensorium and slowly correcting lab abnormalities Admission and further testing considered, she would benefit from mission for further management.  She is in agreement with plan for admission.      Final diagnoses:  Diabetic ketoacidosis without coma associated with type 1 diabetes mellitus [facility])    ED Discharge Orders     None          Towana Ozell BROCKS, MD 04/05/24 1645

## 2024-04-05 NOTE — ED Triage Notes (Signed)
 Pt came in via POV d/t the last 2 days feeling very fatigued, vomiting & having body aches. A/Ox4, denies missing her diabetes meds, & has not been able to check her sugar at home d/t the meter being out of batteries. CBG >500 in triage upon arrival.

## 2024-04-06 DIAGNOSIS — E871 Hypo-osmolality and hyponatremia: Secondary | ICD-10-CM | POA: Diagnosis not present

## 2024-04-06 DIAGNOSIS — Z91419 Personal history of unspecified adult abuse: Secondary | ICD-10-CM | POA: Diagnosis not present

## 2024-04-06 DIAGNOSIS — Z794 Long term (current) use of insulin: Secondary | ICD-10-CM | POA: Diagnosis not present

## 2024-04-06 DIAGNOSIS — Z87891 Personal history of nicotine dependence: Secondary | ICD-10-CM | POA: Diagnosis not present

## 2024-04-06 DIAGNOSIS — B2700 Gammaherpesviral mononucleosis without complication: Secondary | ICD-10-CM | POA: Diagnosis not present

## 2024-04-06 DIAGNOSIS — Z79899 Other long term (current) drug therapy: Secondary | ICD-10-CM | POA: Diagnosis not present

## 2024-04-06 DIAGNOSIS — N179 Acute kidney failure, unspecified: Secondary | ICD-10-CM | POA: Diagnosis not present

## 2024-04-06 DIAGNOSIS — E1010 Type 1 diabetes mellitus with ketoacidosis without coma: Secondary | ICD-10-CM | POA: Diagnosis not present

## 2024-04-06 DIAGNOSIS — E860 Dehydration: Secondary | ICD-10-CM | POA: Diagnosis not present

## 2024-04-06 DIAGNOSIS — Z8249 Family history of ischemic heart disease and other diseases of the circulatory system: Secondary | ICD-10-CM | POA: Diagnosis not present

## 2024-04-06 DIAGNOSIS — R17 Unspecified jaundice: Secondary | ICD-10-CM | POA: Diagnosis not present

## 2024-04-06 DIAGNOSIS — Z833 Family history of diabetes mellitus: Secondary | ICD-10-CM | POA: Diagnosis not present

## 2024-04-06 DIAGNOSIS — E876 Hypokalemia: Secondary | ICD-10-CM | POA: Diagnosis not present

## 2024-04-06 DIAGNOSIS — F331 Major depressive disorder, recurrent, moderate: Secondary | ICD-10-CM | POA: Diagnosis not present

## 2024-04-06 DIAGNOSIS — R6511 Systemic inflammatory response syndrome (SIRS) of non-infectious origin with acute organ dysfunction: Secondary | ICD-10-CM | POA: Diagnosis not present

## 2024-04-06 DIAGNOSIS — Z1152 Encounter for screening for COVID-19: Secondary | ICD-10-CM | POA: Diagnosis not present

## 2024-04-06 LAB — CBC
HCT: 37.2 % (ref 36.0–46.0)
Hemoglobin: 12.9 g/dL (ref 12.0–15.0)
MCH: 29.5 pg (ref 26.0–34.0)
MCHC: 34.7 g/dL (ref 30.0–36.0)
MCV: 84.9 fL (ref 80.0–100.0)
Platelets: 318 K/uL (ref 150–400)
RBC: 4.38 MIL/uL (ref 3.87–5.11)
RDW: 13.8 % (ref 11.5–15.5)
WBC: 14.5 K/uL — ABNORMAL HIGH (ref 4.0–10.5)
nRBC: 0 % (ref 0.0–0.2)

## 2024-04-06 LAB — COMPREHENSIVE METABOLIC PANEL WITH GFR
ALT: 17 U/L (ref 0–44)
AST: 21 U/L (ref 15–41)
Albumin: 3.4 g/dL — ABNORMAL LOW (ref 3.5–5.0)
Alkaline Phosphatase: 77 U/L (ref 38–126)
Anion gap: 13 (ref 5–15)
BUN: 8 mg/dL (ref 6–20)
CO2: 17 mmol/L — ABNORMAL LOW (ref 22–32)
Calcium: 8.5 mg/dL — ABNORMAL LOW (ref 8.9–10.3)
Chloride: 99 mmol/L (ref 98–111)
Creatinine, Ser: 0.89 mg/dL (ref 0.44–1.00)
GFR, Estimated: >60 mL/min (ref >60)
Glucose, Bld: 205 mg/dL — ABNORMAL HIGH (ref 70–99)
Potassium: 3.7 mmol/L (ref 3.5–5.1)
Sodium: 129 mmol/L — ABNORMAL LOW (ref 135–145)
Total Bilirubin: 1.2 mg/dL (ref 0.0–1.2)
Total Protein: 6.5 g/dL (ref 6.5–8.1)

## 2024-04-06 LAB — GLUCOSE, CAPILLARY
Glucose-Capillary: 131 mg/dL — ABNORMAL HIGH (ref 70–99)
Glucose-Capillary: 134 mg/dL — ABNORMAL HIGH (ref 70–99)
Glucose-Capillary: 139 mg/dL — ABNORMAL HIGH (ref 70–99)
Glucose-Capillary: 174 mg/dL — ABNORMAL HIGH (ref 70–99)
Glucose-Capillary: 180 mg/dL — ABNORMAL HIGH (ref 70–99)
Glucose-Capillary: 79 mg/dL (ref 70–99)

## 2024-04-06 LAB — BETA-HYDROXYBUTYRIC ACID: Beta-Hydroxybutyric Acid: 0.92 mmol/L — ABNORMAL HIGH (ref 0.05–0.27)

## 2024-04-06 LAB — MAGNESIUM: Magnesium: 1.8 mg/dL (ref 1.7–2.4)

## 2024-04-06 LAB — PHOSPHORUS: Phosphorus: 1.4 mg/dL — ABNORMAL LOW (ref 2.5–4.6)

## 2024-04-06 MED ORDER — DIPHENHYDRAMINE HCL 50 MG/ML IJ SOLN
50.0000 mg | Freq: Once | INTRAMUSCULAR | Status: AC
  Administered 2024-04-06: 50 mg via INTRAVENOUS
  Filled 2024-04-06: qty 1

## 2024-04-06 MED ORDER — PANTOPRAZOLE SODIUM 20 MG PO TBEC
20.0000 mg | DELAYED_RELEASE_TABLET | Freq: Every day | ORAL | Status: DC
  Administered 2024-04-06 – 2024-04-07 (×2): 20 mg via ORAL
  Filled 2024-04-06 (×2): qty 1

## 2024-04-06 MED ORDER — TRAZODONE HCL 50 MG PO TABS
100.0000 mg | ORAL_TABLET | Freq: Every day | ORAL | Status: DC
  Administered 2024-04-06: 100 mg via ORAL
  Filled 2024-04-06: qty 2

## 2024-04-06 MED ORDER — TRAZODONE HCL 50 MG PO TABS: 50.0000 mg | ORAL_TABLET | Freq: Every day | ORAL | Status: DC

## 2024-04-06 MED ORDER — DEXTROSE 5 % IV SOLN
30.0000 mmol | Freq: Once | INTRAVENOUS | Status: AC
  Administered 2024-04-06: 30 mmol via INTRAVENOUS
  Filled 2024-04-06: qty 10

## 2024-04-06 MED ORDER — LACTATED RINGERS IV SOLN: INTRAVENOUS | Status: AC

## 2024-04-06 MED ORDER — POTASSIUM CHLORIDE CRYS ER 20 MEQ PO TBCR: 40.0000 meq | EXTENDED_RELEASE_TABLET | Freq: Once | ORAL | Status: DC

## 2024-04-06 MED ORDER — SERTRALINE HCL 100 MG PO TABS
100.0000 mg | ORAL_TABLET | Freq: Every day | ORAL | Status: DC
Start: 2024-04-06 — End: 2024-04-06
  Administered 2024-04-06: 100 mg via ORAL
  Filled 2024-04-06: qty 1

## 2024-04-06 MED ORDER — MAGNESIUM SULFATE 2 GM/50ML IV SOLN
2.0000 g | Freq: Once | INTRAVENOUS | Status: AC
  Administered 2024-04-06: 2 g via INTRAVENOUS
  Filled 2024-04-06: qty 50

## 2024-04-06 MED ORDER — LACTATED RINGERS IV SOLN: INTRAVENOUS | Status: DC

## 2024-04-06 MED ORDER — METOCLOPRAMIDE HCL 5 MG/ML IJ SOLN
10.0000 mg | Freq: Three times a day (TID) | INTRAMUSCULAR | Status: DC
  Administered 2024-04-06 – 2024-04-07 (×2): 10 mg via INTRAVENOUS
  Filled 2024-04-06 (×2): qty 2

## 2024-04-06 MED ORDER — INSULIN GLARGINE 100 UNIT/ML ~~LOC~~ SOLN
25.0000 [IU] | Freq: Every day | SUBCUTANEOUS | Status: DC
  Administered 2024-04-07: 25 [IU] via SUBCUTANEOUS
  Filled 2024-04-06: qty 0.25

## 2024-04-06 NOTE — Progress Notes (Addendum)
 PROGRESS NOTE                                                                                                                                                                                                             Patient Demographics:    Sabrina Sutton, is a 28 y.o. female, DOB - 1995/08/21, FMW:[id number]  Outpatient Primary MD for the patient is Patient, No Pcp Per    LOS - 0  Admit date - 04/05/2024    Chief Complaint  Patient presents with   Hyperglycemia       Brief Narrative (HPI from H&P)   28 y.o. female with medical history significant of diabetes mellitus type 1 presents with nausea and vomiting.   She has been experiencing nausea and vomiting for the past two days, with emesis containing mucus and stomach acid. She also reports fatigue and pain. No coughing or other respiratory symptoms. Regular bowel movements are noted.   She has type 1 diabetes and adheres to her insulin  regimen, which includes long-acting insulin  at 5 units twice daily and short-acting insulin  at 10 units with meals. However, she is currently unable to check her blood sugar levels due to a dead battery in her glucose meter.  Hence her insulin  intake was slightly off, in the ER diagnosed with DKA and admitted.   Subjective:    Sabrina Sutton today has, No headache, No chest pain, No abdominal pain - No Nausea, No new weakness tingling or numbness, no shortness of breath   Assessment  & Plan :   DKA in a patient with type 1 diabetes mellitus due to malfunction of glucometer and inability to correctly dose her insulin . She has been appropriately treated with DKA protocol, hydrated with IV fluids, gap is closed we will transition her to subcu insulin , advance diet, continue IV fluids for hydration, monitor electrolytes.  Overall better, advance activity if stable likely discharge tomorrow, diabetic educator to see.  Poor outpatient  glycemic control  Lab Results  Component Value Date   HGBA1C 10.3 (H) 01/26/2024   CBG (last 3)  Recent Labs    04/05/24 2332 04/06/24 0055 04/06/24 0257  GLUCAP 137* 134* 131*    Nausea vomiting, dehydration, AKI, hyponatremia, hypophosphatemia and hypokalemia.  Due to #1 above, symptoms have improved, hydrated, electrolytes replaced, improving.  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  Mild hyperbilirubinemia.  Stable other LFTs.  Asymptomatic, monitor trend       Condition - Fair  Family Communication  : None present  Code Status : Full code  Consults  : Diabetic educator  PUD Prophylaxis : PPI   Procedures  :           Disposition Plan  :    Status is: Observation  DVT Prophylaxis  :    enoxaparin  (LOVENOX ) injection 40 mg Start: 04/05/24 1200   Lab Results  Component Value Date   PLT 394 04/05/2024    Diet :  Diet Order             Diet heart healthy/carb modified Room service appropriate? Yes; Fluid consistency: Thin  Diet effective now                    Inpatient Medications  Scheduled Meds:  enoxaparin  (LOVENOX ) injection  40 mg Subcutaneous Q24H   influenza vac split trivalent PF  0.5 mL Intramuscular Tomorrow-1000   insulin  aspart  0-5 Units Subcutaneous QHS   insulin  aspart  0-9 Units Subcutaneous TID WC   insulin  glargine  25 Units Subcutaneous QHS   metoCLOPramide  (REGLAN ) injection  10 mg Intravenous Q8H   pantoprazole   20 mg Oral QAC breakfast   potassium chloride   40 mEq Oral Once   sertraline   100 mg Oral Daily   Continuous Infusions:  lactated ringers  50 mL/hr at 04/06/24 0649   PRN Meds:.butalbital -acetaminophen -caffeine , dextrose , ondansetron  (ZOFRAN ) IV  Antibiotics  :    Anti-infectives (From admission, onward)    None         Objective:   Vitals:   04/05/24 1432 04/05/24 1931 04/05/24 2330 04/06/24 0329  BP: (!) 138/91  121/73 116/75 133/89  Pulse: (!) 122 (!) 106 85   Resp: 14 17 18 20   Temp: 98.4 F (36.9 C) 98.1 F (36.7 C) 98.8 F (37.1 C) 98.4 F (36.9 C)  TempSrc: Oral Oral Oral Oral  SpO2: 100% 100% 100%   Weight:      Height:        Wt Readings from Last 3 Encounters:  04/05/24 52.2 kg  03/22/24 59.1 kg  01/27/24 59.1 kg     Intake/Output Summary (Last 24 hours) at 04/06/2024 0751 Last data filed at 04/06/2024 0340 Gross per 24 hour  Intake 2500 ml  Output 200 ml  Net 2300 ml     Physical Exam  Awake Alert, No new F.N deficits, Normal affect [place].AT,PERRAL Supple Neck, No JVD,   Symmetrical Chest wall movement, Good air movement bilaterally, CTAB RRR,No Gallops,Rubs or new Murmurs,  +ve B.Sounds, Abd Soft, No tenderness,   No Cyanosis, Clubbing or edema      Data Review:    Recent Labs  Lab 04/05/24 0836 04/05/24 0844  WBC 24.0*  --   HGB 13.6 15.3*  15.6*  HCT 39.5 45.0  46.0  PLT 394  --   MCV 85.5  --   MCH 29.4  --   MCHC 34.4  --   RDW 14.0  --     Recent Labs  Lab 04/05/24 0836 04/05/24 0844 04/05/24 0845 04/05/24 1015 04/05/24 1336 04/05/24 1501 04/05/24 1817 04/05/24 2241  NA 123* 120*  121*  --   --   --  130* 129* 131*  K 4.3 4.4  4.3  --   --   --  3.5 3.9 3.1*  CL 79* 86*  --   --   --  96* 96* 100  CO2 14*  --   --   --   --  20* 18* 21*  ANIONGAP 30*  --   --   --   --  14 15 10   GLUCOSE 535* 604*  --   --   --  278* 186* 139*  BUN 24* 28*  --   --   --  16 13 11   CREATININE 1.50* 1.10*  --   --   --  1.18* 0.92 0.87  AST 25  --   --   --   --   --   --   --   ALT 19  --   --   --   --   --   --   --   ALKPHOS 96  --   --   --   --   --   --   --   BILITOT 1.6*  --   --   --   --   --   --   --   ALBUMIN 4.2  --   --   --   --   --   --   --   CRP  --   --   --   --   --   --  0.6  --   LATICACIDVEN  --   --  3.9* 2.5* 2.0* 2.6*  --  1.1  MG 1.8  --   --   --   --   --   --   --   CALCIUM  9.3  --   --   --   --  8.4* 8.2* 8.0*       Recent Labs  Lab 04/05/24 0836 04/05/24 0845 04/05/24 1015 04/05/24 1336 04/05/24 1501 04/05/24 1817 04/05/24 2241  CRP  --   --   --   --   --  0.6  --   LATICACIDVEN  --  3.9* 2.5* 2.0* 2.6*  --  1.1  MG 1.8  --   --   --   --   --   --   CALCIUM  9.3  --   --   --  8.4* 8.2* 8.0*    --------------------------------------------------------------------------------------------------------------- Lab Results  Component Value Date   CHOL 237 (H) 10/14/2018   HDL 70 10/14/2018   LDLCALC 158 (H) 10/14/2018   TRIG 45 10/14/2018   CHOLHDL 3.4 10/14/2018    Lab Results  Component Value Date   HGBA1C 10.3 (H) 01/26/2024   No results for input(s): TSH, T4TOTAL, FREET4, T3FREE, THYROIDAB in the last 72 hours. No results for input(s): VITAMINB12, FOLATE, FERRITIN, TIBC, IRON, RETICCTPCT in the last 72 hours. ------------------------------------------------------------------------------------------------------------------ Cardiac Enzymes No results for input(s): CKMB, TROPONINI, MYOGLOBIN in the last 168 hours.  Invalid input(s): CK  Micro Results Recent Results (from the past 240 hours)  Resp panel by RT-PCR (RSV, Flu A&B, Covid) Anterior Nasal Swab     Status: None   Collection Time: 04/05/24  3:01 PM   Specimen: Anterior Nasal Swab  Result Value Ref Range Status   SARS Coronavirus 2 by RT PCR NEGATIVE NEGATIVE Final   Influenza A by PCR NEGATIVE NEGATIVE Final   Influenza B by PCR NEGATIVE NEGATIVE Final    Comment: (NOTE) The Xpert Xpress SARS-CoV-2/FLU/RSV plus assay is intended as an aid in the diagnosis of influenza from  Nasopharyngeal swab specimens and should not be used as a sole basis for treatment. Nasal washings and aspirates are unacceptable for Xpert Xpress SARS-CoV-2/FLU/RSV testing.  Fact Sheet for Patients: BloggerCourse.com  Fact Sheet for Healthcare  Providers: SeriousBroker.it  This test is not yet approved or cleared by the [place]  FDA and has been authorized for detection and/or diagnosis of SARS-CoV-2 by FDA under an Emergency Use Authorization (EUA). This EUA will remain in effect (meaning this test can be used) for the duration of the COVID-19 declaration under Section 564(b)(1) of the Act, 21 U.S.C. section 360bbb-3(b)(1), unless the authorization is terminated or revoked.     Resp Syncytial Virus by PCR NEGATIVE NEGATIVE Final    Comment: (NOTE) Fact Sheet for Patients: BloggerCourse.com  Fact Sheet for Healthcare Providers: SeriousBroker.it  This test is not yet approved or cleared by the [place]  FDA and has been authorized for detection and/or diagnosis of SARS-CoV-2 by FDA under an Emergency Use Authorization (EUA). This EUA will remain in effect (meaning this test can be used) for the duration of the COVID-19 declaration under Section 564(b)(1) of the Act, 21 U.S.C. section 360bbb-3(b)(1), unless the authorization is terminated or revoked.  Performed at [facility], [address] [place], [place] [address]     Radiology Report No results found.   Signature  -   Lavada Stank M.D on 04/06/2024 at 7:51 AM   -  To page go to [email or web address]

## 2024-04-06 NOTE — Inpatient Diabetes Management (Signed)
 Inpatient Diabetes Program Recommendations  AACE/ADA: New Consensus Statement on Inpatient Glycemic Control (2015)  Target Ranges:  Prepandial:   less than 140 mg/dL      Peak postprandial:   less than 180 mg/dL (1-2 hours)      Critically ill patients:  140 - 180 mg/dL   Lab Results  Component Value Date   GLUCAP 174 (H) 04/06/2024   HGBA1C 10.3 (H) 01/26/2024    Review of Glycemic Control  Diabetes history: DM1 Outpatient Diabetes medications:  Lantus  25 units daily Novolog  4-10 units tid meal coverage Current orders for Inpatient glycemic control: Lantus  25 units daily, Novolog  0-9 units tid, 0-5 units hs  Inpatient Diabetes Program Recommendations:   Spoke with patient via phone (DM coordinator on weekend system wide call). Patient needs batteries for her glucose meter and didn't have transportation to get batteries. So she guessed @ her CBGs and then came into the hospital. Review with patient need to have backup batteries and to keep MD appts. Patient was let go of her previous endocrinologist due to no shows. Patient has Medicaid and requested pt. To check on transportation resources that Medicaid has to go to MD appts. Patient agrees and plans to get batteries. Patient has had 3 hospital admissions and 2 ED admissions in the past 6 months.  Thank you, Quanah Majka E. Yulissa Needham, RN, MSN, CNS, CDCES  Diabetes Coordinator Inpatient Glycemic Control Team Team Pager [phone] (8am-5pm) 04/06/2024 9:37 AM

## 2024-04-06 NOTE — Plan of Care (Signed)
°  Problem: Coping: Goal: Ability to adjust to condition or change in health will improve Outcome: Progressing   Problem: Fluid Volume: Goal: Ability to maintain a balanced intake and output will improve Outcome: Progressing   Problem: Health Behavior/Discharge Planning: Goal: Ability to identify and utilize available resources and services will improve Outcome: Progressing   Problem: Metabolic: Goal: Ability to maintain appropriate glucose levels will improve Outcome: Progressing   Problem: Nutritional: Goal: Maintenance of adequate nutrition will improve Outcome: Progressing Goal: Progress toward achieving an optimal weight will improve Outcome: Progressing   Problem: Skin Integrity: Goal: Risk for impaired skin integrity will decrease Outcome: Progressing

## 2024-04-06 NOTE — Plan of Care (Signed)
 Resolved to DKA.  Transition to subcu insulin  and diet.  Dashaun Onstott, MD [facility] 04/06/2024, 1:32 AM

## 2024-04-07 ENCOUNTER — Other Ambulatory Visit (HOSPITAL_COMMUNITY): Payer: Self-pay

## 2024-04-07 ENCOUNTER — Telehealth (HOSPITAL_COMMUNITY): Payer: Self-pay | Admitting: Pharmacy Technician

## 2024-04-07 DIAGNOSIS — E1010 Type 1 diabetes mellitus with ketoacidosis without coma: Secondary | ICD-10-CM | POA: Diagnosis not present

## 2024-04-07 LAB — CBC
HCT: 34.7 % — ABNORMAL LOW (ref 36.0–46.0)
Hemoglobin: 12.2 g/dL (ref 12.0–15.0)
MCH: 29.5 pg (ref 26.0–34.0)
MCHC: 35.2 g/dL (ref 30.0–36.0)
MCV: 84 fL (ref 80.0–100.0)
Platelets: 291 K/uL (ref 150–400)
RBC: 4.13 MIL/uL (ref 3.87–5.11)
RDW: 13.3 % (ref 11.5–15.5)
WBC: 8.1 K/uL (ref 4.0–10.5)
nRBC: 0 % (ref 0.0–0.2)

## 2024-04-07 LAB — COMPREHENSIVE METABOLIC PANEL WITH GFR
ALT: 15 U/L (ref 0–44)
AST: 21 U/L (ref 15–41)
Albumin: 3.2 g/dL — ABNORMAL LOW (ref 3.5–5.0)
Alkaline Phosphatase: 73 U/L (ref 38–126)
Anion gap: 10 (ref 5–15)
BUN: <5 mg/dL — ABNORMAL LOW (ref 6–20)
CO2: 25 mmol/L (ref 22–32)
Calcium: 8.7 mg/dL — ABNORMAL LOW (ref 8.9–10.3)
Chloride: 101 mmol/L (ref 98–111)
Creatinine, Ser: 0.79 mg/dL (ref 0.44–1.00)
GFR, Estimated: >60 mL/min (ref >60)
Glucose, Bld: 73 mg/dL (ref 70–99)
Potassium: 3.4 mmol/L — ABNORMAL LOW (ref 3.5–5.1)
Sodium: 136 mmol/L (ref 135–145)
Total Bilirubin: 1.4 mg/dL — ABNORMAL HIGH (ref 0.0–1.2)
Total Protein: 5.9 g/dL — ABNORMAL LOW (ref 6.5–8.1)

## 2024-04-07 LAB — MAGNESIUM: Magnesium: 2.1 mg/dL (ref 1.7–2.4)

## 2024-04-07 LAB — PHOSPHORUS: Phosphorus: 2.9 mg/dL (ref 2.5–4.6)

## 2024-04-07 LAB — GLUCOSE, CAPILLARY
Glucose-Capillary: 88 mg/dL (ref 70–99)
Glucose-Capillary: 219 mg/dL — ABNORMAL HIGH (ref 70–99)

## 2024-04-07 MED ORDER — POTASSIUM CHLORIDE CRYS ER 20 MEQ PO TBCR
40.0000 meq | EXTENDED_RELEASE_TABLET | Freq: Once | ORAL | Status: AC
  Administered 2024-04-07: 40 meq via ORAL
  Filled 2024-04-07: qty 2

## 2024-04-07 NOTE — Plan of Care (Signed)
  Problem: Education: Goal: Ability to describe self-care measures that may prevent or decrease complications (Diabetes Survival Skills Education) will improve Outcome: Completed/Met Goal: Individualized Educational Video(s) Outcome: Completed/Met   Problem: Coping: Goal: Ability to adjust to condition or change in health will improve Outcome: Completed/Met   Problem: Fluid Volume: Goal: Ability to maintain a balanced intake and output will improve Outcome: Completed/Met   Problem: Health Behavior/Discharge Planning: Goal: Ability to identify and utilize available resources and services will improve Outcome: Completed/Met Goal: Ability to manage health-related needs will improve Outcome: Completed/Met   Problem: Metabolic: Goal: Ability to maintain appropriate glucose levels will improve Outcome: Completed/Met   Problem: Nutritional: Goal: Maintenance of adequate nutrition will improve Outcome: Completed/Met Goal: Progress toward achieving an optimal weight will improve Outcome: Completed/Met   Problem: Skin Integrity: Goal: Risk for impaired skin integrity will decrease Outcome: Completed/Met   Problem: Tissue Perfusion: Goal: Adequacy of tissue perfusion will improve Outcome: Completed/Met   Problem: Education: Goal: Ability to describe self-care measures that may prevent or decrease complications (Diabetes Survival Skills Education) will improve Outcome: Completed/Met Goal: Individualized Educational Video(s) Outcome: Completed/Met   Problem: Cardiac: Goal: Ability to maintain an adequate cardiac output will improve Outcome: Completed/Met   Problem: Health Behavior/Discharge Planning: Goal: Ability to identify and utilize available resources and services will improve Outcome: Completed/Met Goal: Ability to manage health-related needs will improve Outcome: Completed/Met   Problem: Fluid Volume: Goal: Ability to achieve a balanced intake and output will  improve Outcome: Completed/Met   Problem: Metabolic: Goal: Ability to maintain appropriate glucose levels will improve Outcome: Completed/Met   Problem: Nutritional: Goal: Maintenance of adequate nutrition will improve Outcome: Completed/Met Goal: Maintenance of adequate weight for body size and type will improve Outcome: Completed/Met   Problem: Respiratory: Goal: Will regain and/or maintain adequate ventilation Outcome: Completed/Met   Problem: Urinary Elimination: Goal: Ability to achieve and maintain adequate renal perfusion and functioning will improve Outcome: Completed/Met   Problem: Education: Goal: Knowledge of General Education information will improve Description: Including pain rating scale, medication(s)/side effects and non-pharmacologic comfort measures Outcome: Completed/Met   Problem: Health Behavior/Discharge Planning: Goal: Ability to manage health-related needs will improve Outcome: Completed/Met   Problem: Clinical Measurements: Goal: Ability to maintain clinical measurements within normal limits will improve Outcome: Completed/Met Goal: Will remain free from infection Outcome: Completed/Met Goal: Diagnostic test results will improve Outcome: Completed/Met Goal: Respiratory complications will improve Outcome: Completed/Met Goal: Cardiovascular complication will be avoided Outcome: Completed/Met   Problem: Activity: Goal: Risk for activity intolerance will decrease Outcome: Completed/Met   Problem: Nutrition: Goal: Adequate nutrition will be maintained Outcome: Completed/Met   Problem: Coping: Goal: Level of anxiety will decrease Outcome: Completed/Met   Problem: Elimination: Goal: Will not experience complications related to bowel motility Outcome: Completed/Met Goal: Will not experience complications related to urinary retention Outcome: Completed/Met   Problem: Pain Managment: Goal: General experience of comfort will improve and/or  be controlled Outcome: Completed/Met   Problem: Safety: Goal: Ability to remain free from injury will improve Outcome: Completed/Met   Problem: Skin Integrity: Goal: Risk for impaired skin integrity will decrease Outcome: Completed/Met

## 2024-04-07 NOTE — Discharge Summary (Signed)
 Sabrina Sutton FMW:[id number] DOB: 06/07/1996 DOA: 04/05/2024  PCP: Patient, No Pcp Per  Admit date: 04/05/2024  Discharge date: 04/07/2024  Admitted From: Home   Disposition:  Home   Recommendations for Outpatient Follow-up:   Follow up with PCP in 1-2 weeks  PCP Please obtain BMP/CBC, 2 view CXR in 1week,  (see Discharge instructions)   PCP Please follow up on the following pending results:    Home Health: None   Equipment/Devices: None  Consultations: None  Discharge Condition: Stable    CODE STATUS: Full    Diet Recommendation: Heart Healthy Low Carb   Chief Complaint  Patient presents with   Hyperglycemia     Brief history of present illness from the day of admission and additional interim summary     28 y.o. female with medical history significant of diabetes mellitus type 1 presents with nausea and vomiting.   She has been experiencing nausea and vomiting for the past two days, with emesis containing mucus and stomach acid. She also reports fatigue and pain. No coughing or other respiratory symptoms. Regular bowel movements are noted.   She has type 1 diabetes and adheres to her insulin  regimen, which includes long-acting insulin  at 5 units twice daily and short-acting insulin  at 10 units with meals. However, she is currently unable to check her blood sugar levels due to a dead battery in her glucose meter.  Hence her insulin  intake was slightly off, in the ER diagnosed with DKA and admitted.                                                                 Hospital Course   DKA in a patient with type 1 diabetes mellitus due to malfunction of glucometer and inability to correctly dose her insulin . She has been appropriately treated with DKA protocol, hydrated with IV fluids, gap is closed, transition  to subcu insulin  and stable, now symptom-free and back to baseline.  Requested to make sure her glucometer is working prior to discharge, she confirms she has all testing supplies and insulin  at home.  Continue home regimen and follow-up with PCP for glycemic control.  Written instructions provided  Lab Results  Component Value Date   HGBA1C 10.3 (H) 01/26/2024    Nausea vomiting, dehydration, AKI, hyponatremia, hypophosphatemia and hypokalemia.  Due to #1 above, symptoms have improved, hydrated, electrolytes replaced, now symptom-free at baseline.   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   Mild hyperbilirubinemia.  Stable other LFTs.  Asymptomatic, near normal today PCP to recheck in 7 to 10 days.   Discharge diagnosis     Active Problems:   DKA, type 1 (HCC)   SIRS (systemic inflammatory response syndrome) (HCC)   Lactic acidosis  AKI (acute kidney injury) (HCC)   Hyponatremia   Nausea and vomiting   Hyperbilirubinemia   History of Epstein-Barr virus infection    Discharge instructions    Discharge Instructions     Discharge instructions   Complete by: As directed    Please make sure your glucometer is working before you leave the hospital, if you need a new glucometer please let us  know.    Follow with Primary MD  in 7 days   Get CBC, CMP, Magnesium   -  checked next visit with your primary MD   Activity: As tolerated with Full fall precautions use walker/cane & assistance as needed  Disposition Home   Diet: Heart Healthy Low carb  Accuchecks 4 times/day, Once in AM empty stomach and then before each meal. Log in all results and show them to your Prim.MD in 3 days. If any glucose reading is under 80 or above 300 call your Prim MD immidiately. Follow Low glucose instructions for glucose under 80 as instructed.   Special Instructions: If you have smoked or chewed Tobacco  in the last 2  yrs please stop smoking, stop any regular Alcohol   and or any Recreational drug use.  On your next visit with your primary care physician please Get Medicines reviewed and adjusted.  Please request your Prim.MD to go over all Hospital Tests and Procedure/Radiological results at the follow up, please get all Hospital records sent to your Prim MD by signing hospital release before you go home.  If you experience worsening of your admission symptoms, develop shortness of breath, life threatening emergency, suicidal or homicidal thoughts you must seek medical attention immediately by calling 911 or calling your MD immediately  if symptoms less severe.  You Must read complete instructions/literature along with all the possible adverse reactions/side effects for all the Medicines you take and that have been prescribed to you. Take any new Medicines after you have completely understood and accpet all the possible adverse reactions/side effects.   Do not drive when taking Pain medications.  Do not take more than prescribed Pain, Sleep and Anxiety Medications  Wear Seat belts while driving.   Increase activity slowly   Complete by: As directed        Discharge Medications   Allergies as of 04/07/2024   No Known Allergies      Medication List     TAKE these medications    glucose blood test strip Use as instructed   insulin  glargine 100 UNIT/ML injection Commonly known as: LANTUS  Inject 0.25 mLs (25 Units total) into the skin daily. What changed: how much to take   metoCLOPramide  5 MG tablet Commonly known as: Reglan  Take 2 tablets (10 mg total) by mouth every 8 (eight) hours as needed for up to 15 days for nausea.   NovoLOG  FlexPen 100 UNIT/ML FlexPen Generic drug: insulin  aspart INJECT 4 TO 10 UNITS UNDER THE SKIN THREE TIMES DAILY WITH MEALS IF GLUCOSE IS ABOVE 60 AND YOU ARE EATING   ondansetron  4 MG disintegrating tablet Commonly known as: ZOFRAN -ODT Take 1 tablet (4 mg  total) by mouth every 8 (eight) hours as needed for nausea or vomiting.   pantoprazole  20 MG tablet Commonly known as: Protonix  Take 1 tablet (20 mg total) by mouth daily before breakfast.   sertraline  100 MG tablet Commonly known as: ZOLOFT  Take 100 mg by mouth daily.   TechLite Pen Needles 32G X 4 MM Misc Generic drug: Insulin  Pen Needle Use to inject insulin   daily         Follow-up Information     Anyanwu, Gloris LABOR, MD. Schedule an appointment as soon as possible for a visit in 1 week(s).   Specialty: Obstetrics and Gynecology Contact information: [address] [place] [address] [phone]         [facility]. Schedule an appointment as soon as possible for a visit in 1 week(s).   Contact information: [address] [employer] [address] [place]  [address] [phone]                Major procedures and Radiology Reports - PLEASE review detailed and final reports thoroughly  -        No results found.  Micro Results    Recent Results (from the past 240 hours)  Resp panel by RT-PCR (RSV, Flu A&B, Covid) Anterior Nasal Swab     Status: None   Collection Time: 04/05/24  3:01 PM   Specimen: Anterior Nasal Swab  Result Value Ref Range Status   SARS Coronavirus 2 by RT PCR NEGATIVE NEGATIVE Final   Influenza A by PCR NEGATIVE NEGATIVE Final   Influenza B by PCR NEGATIVE NEGATIVE Final    Comment: (NOTE) The Xpert Xpress SARS-CoV-2/FLU/RSV plus assay is intended as an aid in the diagnosis of influenza from Nasopharyngeal swab specimens and should not be used as a sole basis for treatment. Nasal washings and aspirates are unacceptable for Xpert Xpress SARS-CoV-2/FLU/RSV testing.  Fact Sheet for Patients: BloggerCourse.com  Fact Sheet for Healthcare Providers: SeriousBroker.it  This test is not yet approved or cleared by the [place]  FDA and has  been authorized for detection and/or diagnosis of SARS-CoV-2 by FDA under an Emergency Use Authorization (EUA). This EUA will remain in effect (meaning this test can be used) for the duration of the COVID-19 declaration under Section 564(b)(1) of the Act, 21 U.S.C. section 360bbb-3(b)(1), unless the authorization is terminated or revoked.     Resp Syncytial Virus by PCR NEGATIVE NEGATIVE Final    Comment: (NOTE) Fact Sheet for Patients: BloggerCourse.com  Fact Sheet for Healthcare Providers: SeriousBroker.it  This test is not yet approved or cleared by the [place]  FDA and has been authorized for detection and/or diagnosis of SARS-CoV-2 by FDA under an Emergency Use Authorization (EUA). This EUA will remain in effect (meaning this test can be used) for the duration of the COVID-19 declaration under Section 564(b)(1) of the Act, 21 U.S.C. section 360bbb-3(b)(1), unless the authorization is terminated or revoked.  Performed at [facility], [address], [place], [place] [address]   Culture, blood (Routine X 2) w Reflex to ID Panel     Status: None (Preliminary result)   Collection Time: 04/05/24  5:57 PM   Specimen: BLOOD LEFT HAND  Result Value Ref Range Status   Specimen Description BLOOD LEFT HAND  Final   Special Requests   Final    BOTTLES DRAWN AEROBIC AND ANAEROBIC Blood Culture adequate volume   Culture   Final    NO GROWTH < 24 HOURS Performed at [facility], [address], [place], [place] [address]    Report Status PENDING  Incomplete  Culture, blood (Routine X 2) w Reflex to ID Panel     Status: None (Preliminary result)   Collection Time: 04/05/24  6:17 PM   Specimen: BLOOD RIGHT ARM  Result Value Ref Range Status   Specimen Description BLOOD RIGHT ARM  Final  Special Requests   Final    BOTTLES DRAWN AEROBIC AND ANAEROBIC Blood Culture results may not be optimal due to an inadequate volume  of blood received in culture bottles   Culture   Final    NO GROWTH < 24 HOURS Performed at [facility], [address], [place], [place] [address]    Report Status PENDING  Incomplete    Today   Subjective    Latoi Pepperman today has no headache,no chest abdominal pain,no new weakness tingling or numbness, feels much better wants to go home today.     Objective   Blood pressure 136/88, pulse 75, temperature 98.4 F (36.9 C), temperature source Oral, resp. rate 15, height 5' 7 (1.702 m), weight 52.2 kg, SpO2 98%, not currently breastfeeding.   Intake/Output Summary (Last 24 hours) at 04/07/2024 0900 Last data filed at 04/06/2024 1500 Gross per 24 hour  Intake 727.58 ml  Output --  Net 727.58 ml    Exam  Awake Alert, No new F.N deficits,    [place].AT,PERRAL Supple Neck,   Symmetrical Chest wall movement, Good air movement bilaterally, CTAB RRR,No Gallops,   +ve B.Sounds, Abd Soft, Non tender,  No Cyanosis, Clubbing or edema    Data Review   Recent Labs  Lab 04/05/24 0836 04/05/24 0844 04/06/24 0718 04/07/24 0440  WBC 24.0*  --  14.5* 8.1  HGB 13.6 15.3*  15.6* 12.9 12.2  HCT 39.5 45.0  46.0 37.2 34.7*  PLT 394  --  318 291  MCV 85.5  --  84.9 84.0  MCH 29.4  --  29.5 29.5  MCHC 34.4  --  34.7 35.2  RDW 14.0  --  13.8 13.3    Recent Labs  Lab 04/05/24 0836 04/05/24 0844 04/05/24 0845 04/05/24 1015 04/05/24 1336 04/05/24 1501 04/05/24 1817 04/05/24 2241 04/06/24 0718 04/07/24 0440  NA 123*   < >  --   --   --  130* 129* 131* 129* 136  K 4.3   < >  --   --   --  3.5 3.9 3.1* 3.7 3.4*  CL 79*   < >  --   --   --  96* 96* 100 99 101  CO2 14*  --   --   --   --  20* 18* 21* 17* 25  ANIONGAP 30*  --   --   --   --  14 15 10 13 10   GLUCOSE 535*   < >  --   --   --  278* 186* 139* 205* 73  BUN 24*   < >  --   --   --  16 13 11 8  <5*  CREATININE 1.50*   < >  --   --   --  1.18* 0.92 0.87 0.89 0.79  AST 25  --   --   --   --   --   --   --  21  21  ALT 19  --   --   --   --   --   --   --  17 15  ALKPHOS 96  --   --   --   --   --   --   --  77 73  BILITOT 1.6*  --   --   --   --   --   --   --  1.2 1.4*  ALBUMIN 4.2  --   --   --   --   --   --   --  3.4* 3.2*  CRP  --   --   --   --   --   --  0.6  --   --   --   LATICACIDVEN  --   --  3.9* 2.5* 2.0* 2.6*  --  1.1  --   --   MG 1.8  --   --   --   --   --   --   --  1.8 2.1  PHOS  --   --   --   --   --   --   --   --  1.4* 2.9  CALCIUM  9.3  --   --   --   --  8.4* 8.2* 8.0* 8.5* 8.7*   < > = values in this interval not displayed.    Total Time in preparing paper work, data evaluation and todays exam - 35 minutes  Signature  -    Lavada Stank M.D on 04/07/2024 at 9:00 AM   -  To page go to [email or web address]

## 2024-04-07 NOTE — Plan of Care (Signed)
  Problem: Health Behavior/Discharge Planning: Goal: Ability to identify and utilize available resources and services will improve Outcome: Progressing Goal: Ability to manage health-related needs will improve Outcome: Progressing   Problem: Metabolic: Goal: Ability to maintain appropriate glucose levels will improve Outcome: Progressing   Problem: Cardiac: Goal: Ability to maintain an adequate cardiac output will improve Outcome: Progressing   Problem: Respiratory: Goal: Will regain and/or maintain adequate ventilation Outcome: Progressing   Problem: Education: Goal: Knowledge of General Education information will improve Description: Including pain rating scale, medication(s)/side effects and non-pharmacologic comfort measures Outcome: Progressing

## 2024-04-07 NOTE — Discharge Instructions (Addendum)
 Please make sure your glucometer is working before you leave the hospital, if you need a new glucometer please let us  know.    Follow with Primary MD  in 7 days   Get CBC, CMP, Magnesium   -  checked next visit with your primary MD   Activity: As tolerated with Full fall precautions use walker/cane & assistance as needed  Disposition Home   Diet: Heart Healthy Low carb  Accuchecks 4 times/day, Once in AM empty stomach and then before each meal. Log in all results and show them to your Prim.MD in 3 days. If any glucose reading is under 80 or above 300 call your Prim MD immidiately. Follow Low glucose instructions for glucose under 80 as instructed.   Special Instructions: If you have smoked or chewed Tobacco  in the last 2 yrs please stop smoking, stop any regular Alcohol   and or any Recreational drug use.  On your next visit with your primary care physician please Get Medicines reviewed and adjusted.  Please request your Prim.MD to go over all Hospital Tests and Procedure/Radiological results at the follow up, please get all Hospital records sent to your Prim MD by signing hospital release before you go home.  If you experience worsening of your admission symptoms, develop shortness of breath, life threatening emergency, suicidal or homicidal thoughts you must seek medical attention immediately by calling 911 or calling your MD immediately  if symptoms less severe.  You Must read complete instructions/literature along with all the possible adverse reactions/side effects for all the Medicines you take and that have been prescribed to you. Take any new Medicines after you have completely understood and accpet all the possible adverse reactions/side effects.   Do not drive when taking Pain medications.  Do not take more than prescribed Pain, Sleep and Anxiety Medications  Wear Seat belts while driving.      [employer] assistance programs Crisis assistance programs  -Partners  Ending Homelessness Arts development officer. If you are experiencing homelessness in [place], [place] , your first point of contact should be Pensions consultant. You can reach Coordinated Entry by calling (336) 870-015-1808 or by emailing coordinatedentry@partnersendinghomelessness .[email or web address].  Community access points: [employer] 503 689 [address], [place]) every Tuesday from 9am-10am. [facility] ([address] [place]. [address] [place]) every Wednesday from 8am-9am.   -[place] Coordinated Re-entry Daniel Mcalpine: Dial  211 and request. Offers referrals to homeless shelters in the area.    -The [employer] (907) 652-3331) offers several services to local families, as funding allows. The Emergency Assistance Program (EAP), which they administer, provides household goods, free food, clothing, and financial aid to people in need in the [place]  area. The EAP program does have some qualification, and counselors will interview clients for financial assistance by written referral only. Referrals need to be made by the Department of Social Services or by other EAP approved human services agencies or charities in the area.  -Open [facility] Ministries of [employer], which can be reached at (838) 772-1171, offers emergency assistance programs for those in need of help, such as food, rent assistance, a soup kitchen, shelter, and clothing. They are based in [facility]  but provide a number of services to those that qualify for assistance.   [facility] Department of Social Services may be able to offer temporary financial assistance and cash grants for paying rent and utilities, Help may be provided for local county residents who may be experiencing personal crisis when other  resources, including government programs, are not available. Call (934)304-7174  -High [employer] is a [employer] agency, The organization can offer emergency assistance for  paying rent, Caremark Rx, utilities, food, household products and furniture. They offer extensive emergency and transitional housing for families, children and single women, and also run a Boy's and [employer]. Thrift Shops, Secondary school teacher, and other aid offered too. [address], [place], [place]  [address], [phone]  -[place] Low Income Energy Assistance Program -- This is offered for [facility] families. The federal government created [employer] Program provides a one-time cash grant payment to help eligible low-income families pay their electric and heating bills. [address], [place], [place]  [address], (520)403-8755  -[place] Emergency Assistance -- A program offers emergency utility and rent funds for greater [employer] area residents. The program can also provide counseling and referrals to charities and government programs. Also provides food and a free meal program that serves lunch Mondays - Saturdays and dinner seven days per week to individuals in the community. [address] [place], [place]  [address], [phone]  -[employer] - Offers affordable apartment and housing communities across      [place] and [place]. The low income and seniors can access public housing, rental assistance to qualified applicants, and apply for the section 8 rent subsidy program. Other programs include Chiropractor and Engineer, maintenance. [address], [place], [place]  [address], dial  [phone].  -The [facility] provides transitional housing to veterans and the disabled. Clients will also access other services too, including assistance in applying for Disability, life skills classes, case management, and assistance in finding permanent housing. [address], [place], [place]  [address], call ([phone])333-0461  -[facility] [place] Transitional  Housing through [employer] is for people who were just evicted or that are formerly homeless. The non-profit will also help then gain self-sufficiency, find a home or apartment to live in, and also provides information on rent assistance when needed. Phone ([phone]  -The [employer] Tri[place]  [place] [employer] Program helps low income, elderly, or disabled residents in seven counties in the [place]  ([place], [place], [place], [place], [place], [place], [place], and [place]) save energy and reduce their utility bills by improving energy efficiency. Phone [phone].  -[employer] is located in the [place] [facility] in the [employer], [address], [place], [place] [address]. Parking is in the rear of the building. Phone: [phone]   General Email: Sophia ann  GHC provides free housing counseling assistance in locating affordable rental housing or housing with support services for families and individuals in crisis and the chronically homeless. We provide potential resources for other housing needs like utilities. Our trained counselors also work with clients on budgeting and financial literacy in effort to empower them to take control of their financial situations. [employer] collaborates with homeless service providers and other stakeholders as part of the [employer] COC (Continuum of Care). The (COC) is a regional/local planning body that coordinates housing and services funding for homeless families and individuals. The role of GHC in the COC is through housing counseling to work with people we serve on diversion strategies for those that are at imminent risk of becoming homeless. We also work with the Coordinated Assessment/Entry Specialist who attempts to find temporary solutions and/or connects the people to Housing First, Rapid Re-housing or transitional housing programs.  Our  Homelessness Prevention Housing Counselors meet with clients on business days (Monday-Fridays, except scheduled holidays) from 8:30 am to 4:30 pm.  Legal assistance for evictions, foreclosure, and more -If you need free legal advice on civil issues, such as foreclosures, evictions, Electronics engineer, government programs, domestic issues and more, Landscape architect of [place]  [facility]) is a Associate Professor firm that provides free legal services and counsel to lower income people, seniors, disabled, and others, The goal is to ensure everyone has access to justice and fair representation. Call them at [phone].  [facility] for Housing and Community Studies can provide info about obtaining legal assistance with evictions. Phone ([phone])[phone].  Data processing manager  The [employer], [employer]. offers job and Dispensing optician. Resources are focused on helping students obtain the skills and experiences that are necessary to compete in today's challenging and tight job market. The non-profit faith-based community action agency offers internship trainings as well as classroom instruction. Classes are tailored to meet the needs of people in the [facility] region. [place], [place] [address], ([phone]  Foreclosure prevention/Debt Services Family Services of the [employer] Credit Counseling Service inludes debt and foreclosure prevention programs for local families. This includes money management, financial advice, budget review and development of a written action plan with a Pensions consultant to help solve specific individual financial problems. In addition, housing and mortgage counselors can also provide pre- and post-purchase homeownership counseling, default resolution counseling (to prevent foreclosure) and reverse mortgage counseling. A Debt Management Program allows people and families with a high level of credit card or medical debt to  consolidate and repay consumer debt and loans to creditors and rebuild positive credit ratings and scores. Contact (336) F1555895.  Community clinics in [place] -Health Department [facility]: [address] [place], [place], [address]. [phone].  -Health Department [place] [facility]: [address], [place], [address]. [phone].  -[facility] Network offers medical care through a group of doctors, pharmacies and other healthcare related agencies that offer services for low income, uninsured adults in [place]. Also offers adult Dental care and assistance with applying for an [employer]. Call [phone].   [facility]. This center provides low-cost health care to those without health insurance. Services offered include an onsite pharmacy. Phone [phone]. [address], [address], [place].  -Medication Assistance Program serves as a link between pharmaceutical companies and patients to provide low cost or free prescription medications. This service is available for residents who meet certain income restrictions and have no insurance coverage. PLEASE CALL [phone] [place]) OR [phone] ([place])  -One Step Further: Materials engineer, The [employer] Support & Nutrition Program, [employer]. Call [phone]/ [phone].  Food pantry and assistance -[facility]ban Ministry-Food Bank: [address], [place] [address]. Phone [phone]  -[facility] Food Pantry: [address] [place], [place] [address]. [phone].  -Missionary Ministry: has the purpose of visiting the sick and shut-ins and provide for needs in the surrounding communities. Call [phone]. Email: [email or web address] .[email or web address] This program provides: Food box for seniors, Financial assistance, Food to meet basic nutritional needs.  -Meals on Wheels with Senior Resources: [facility]  residents age 40 and over who are homebound and unable to obtain and prepare a nutritious meal for themselves are eligible for this service. There may be a waiting list in certain parts of [facility] if the  route in that area is full. If you are in [facility] and [place] call ([phone] to register. For all other areas call [phone] to register.  -Greater Dietitian: https://findfood.BargainContractor.[email or web address]  TRANSPORTATION: -[employer] Department of Health: Call [facility] and [employer] at [phone] for details. AttractionGuides.es  -Access [place]: Access [place] is the [employer] Agency's shared-ride transportation service for eligible riders who have a disability that prevents them from riding the fixed route bus. Call [phone]. Access [place] riders must pay a fare of $1.50 per trip, or may purchase a 10-ride punch card for $14.00 ($1.40 per ride) or a 40-ride punch card for $48.00 ($1.20 per ride).  -The Shepherd's WHEELS rideshare transportation service is provided for senior citizens (60+) who live independently within [place] city limits and are unable to drive or have limited access to transportation. Call [phone] to schedule an appointment.  -[place] Transportation: For Medicare or Medicaid recipients call [phone]?[facility] Ambulance, wheelchair fleeta, and ambulatory quotes available.   FLEEING VIOLENCE: -Family Services of the [place]- 24/7 Crisis line [phone]) -[facility] Justice Centers: (336) [phone]-[phone] 5703328328)  [place] 2-1-1 is another useful way to locate resources in the community. Visit ShedSizes.ch to find service information online. If you need additional assistance, 2-1-1 Referral Specialists are available 24 hours a day, every day by dialing 2-1-1 or [phone] from any phone. The call is free, confidential, and  available in any language.  Affordable Housing Search http://www.nchousingsearch.[facility])   M-F 8a-3p [address] [place]  [place], [place] [address] [phone] Services include: laundry, barbering, support groups, case management, phone & computer access, showers, AA/NA mtgs, mental health/substance abuse nurse, job skills class, disability information, VA assistance, spiritual classes, etc. Winter Shelter available when temperatures are less than 32 degrees.   HOMELESS SHELTERS [facility] at [facility]- Call [phone] ext. 347 or ext. 336. Located at [address], [place], [place] [address]  [facility] Mens Shelter- Call [phone]. Located at [address] [place] [address].  [facility]- [employer]. Call [phone]. Office located at [address] [place] [address].  [facility] Family Housing through [place] [phone].  [facility] Family Shelter- Call [phone]. Located at [address] [place], [place], [place] [address].  [facility] at the Inn-For Pregnant mothers. Call [phone]. Located at [address] [place], [address].  [place] [facility]-For men in [place]. Call [phone]. [facility]-Shelter in [place]. Call [phone].  Home of [employer] for [employer] [phone]. Office located at [address] [place], [address].  [employer] be agreeable to help with chores. Call [phone] ext. [phone].  Men's: [address] [place], [place] [address]. Women's: [facility]  [address] [place], [place] [address]  Crisis Services [facility] Mobile Crisis Management- [phone]  [facility] [address] [place], [place] [address]. Phone: [phone]

## 2024-04-07 NOTE — Progress Notes (Signed)
 Reviewed AVS, patient expressed understanding of medications, MD follow up reviewed.   Removed IV, Site clean, dry and intact.  Patient states all belongings brought to the hospital at time of admission are accounted for and packed to take home.  Patient CBG was low during a.m. and will be rechecked after she eats breakfast.  Patient has a ride that will pick her up afterwards.

## 2024-04-07 NOTE — Telephone Encounter (Signed)
 Pharmacy Patient Advocate Encounter  Received notification from [facility] MEDICAID that Prior Authorization for Dexcom G7 Sensor  has been APPROVED from 04/07/2024 to 10/05/2024. Ran test claim, Copay is $0.00. This test claim was processed through [facility]- copay amounts may vary at other pharmacies due to pharmacy/plan contracts, or as the patient moves through the different stages of their insurance plan.   PA #/Case ID/Reference #: [id number]

## 2024-04-07 NOTE — Telephone Encounter (Signed)
 Pharmacy Patient Advocate Encounter   Received notification from Inpatient Request that prior authorization for Dexcom G7 Sensor  is required/requested.   Insurance verification completed.   The patient is insured through [facility] MEDICAID .   Per test claim: PA required; PA submitted to above mentioned insurance via Latent Key/confirmation #/EOC [id number] Status is pending

## 2024-04-07 NOTE — Telephone Encounter (Signed)
 Patient Product/process development scientist completed.    The patient is insured through [facility] MEDICAID.     Ran test claim for [employer] and Requires Prior Authorizaiton  Ran test claim for [employer] and Requires Prior Authorizaiton  This test claim was processed through [employer]- copay amounts may vary at other pharmacies due to [employer], or as the patient moves through the different stages of their insurance plan.     Reyes Sharps, CPHT Pharmacy Technician III Certified Patient Advocate [facility] Patient Advocate Team Direct Number: [phone]  Fax: [phone]

## 2024-04-07 NOTE — Inpatient Diabetes Management (Signed)
 Inpatient Diabetes Program Recommendations  AACE/ADA: New Consensus Statement on Inpatient Glycemic Control (2015)  Target Ranges:  Prepandial:   less than 140 mg/dL      Peak postprandial:   less than 180 mg/dL (1-2 hours)      Critically ill patients:  140 - 180 mg/dL   Lab Results  Component Value Date   GLUCAP 219 (H) 04/07/2024   HGBA1C 10.3 (H) 01/26/2024    Review of Glycemic Control  Latest Reference Range & Units 04/06/24 02:57 04/06/24 08:15 04/06/24 12:27 04/06/24 16:04 04/06/24 21:25 04/07/24 07:24 04/07/24 10:40  Glucose-Capillary 70 - 99 mg/dL 868 (H) 825 (H) 819 (H) 139 (H) 79 88 219 (H)   Diabetes history: Type 1 DM  Inpatient Diabetes Program Recommendations:    Patient has been on both insulin  pump and CGM in the past. MD states that I can give patient Dexcom samples for discharge.  She states she already has the app on her phone and knows how to use.  Pre-authorization completed for Dexcom G7 by pharmacy as well.   Thanks,  Randall Bullocks, RN, BC-ADM Inpatient Diabetes Coordinator Pager [phone]  (8a-5p)

## 2024-04-09 ENCOUNTER — Telehealth: Payer: Self-pay

## 2024-04-09 DIAGNOSIS — E1065 Type 1 diabetes mellitus with hyperglycemia: Secondary | ICD-10-CM

## 2024-04-09 NOTE — Progress Notes (Signed)
 Complex Care Management Note  Care Guide Note 04/09/2024 Name: Shakedra Beam MRN: [id number] DOB: 03-12-1996  Indiya Izquierdo is a 28 y.o. year old female who sees Patient, No Pcp Per for primary care. I reached out to Katherina Reyburn by phone today to offer complex care management services.  Ms. Cuccaro was given information about Complex Care Management services today including:   The Complex Care Management services include support from the care team which includes your Nurse Care Manager, Clinical Social Worker, or Pharmacist.  The Complex Care Management team is here to help remove barriers to the health concerns and goals most important to you. Complex Care Management services are voluntary, and the patient may decline or stop services at any time by request to their care team member.   Complex Care Management Consent Status: Patient agreed to services and verbal consent obtained.   Follow up plan:  Telephone appointment with complex care management team member scheduled for:  [facility] 04/18/2024 BSW 04/11/2024  Encounter Outcome:  Patient Scheduled  Jeoffrey Buffalo , RMA     [place]  [facility], [facility] Guide  Direct Dial : [phone]  Website: [email or web address]

## 2024-04-10 LAB — CULTURE, BLOOD (ROUTINE X 2)
Culture: NO GROWTH
Culture: NO GROWTH
Special Requests: ADEQUATE

## 2024-04-11 ENCOUNTER — Other Ambulatory Visit: Payer: Self-pay

## 2024-04-15 ENCOUNTER — Telehealth: Payer: Self-pay

## 2024-04-15 NOTE — Progress Notes (Signed)
 Complex Care Management Note Care Guide Note  04/15/2024 Name: Sabrina Sutton MRN: [id number] DOB: August 02, 1995   Complex Care Management Outreach Attempts: An unsuccessful telephone outreach was attempted today to offer the patient information about available complex care management services.  Follow Up Plan:  Additional outreach attempts will be made to offer the patient complex care management information and services.   Encounter Outcome:  No Answer  Dreama Lynwood [facility]  [facility], [facility] VBCI Assistant Direct Dial : [phone]  Fax: [phone]

## 2024-04-18 ENCOUNTER — Telehealth

## 2024-04-18 NOTE — Progress Notes (Signed)
 Complex Care Management Note Care Guide Note  04/18/2024 Name: Sabrina Sutton MRN: [id number] DOB: 03-22-1996   Complex Care Management Outreach Attempts: A second unsuccessful outreach was attempted today to offer the patient with information about available complex care management services.  Follow Up Plan:  Additional outreach attempts will be made to offer the patient complex care management information and services.   Encounter Outcome:  Patient Request to Call Back  Sabrina Sutton [facility]  [facility], [facility] VBCI Assistant Direct Dial : [phone]  Fax: [phone]

## 2024-04-22 ENCOUNTER — Ambulatory Visit: Admitting: Obstetrics and Gynecology

## 2024-04-23 NOTE — Progress Notes (Signed)
 Complex Care Management Care Guide Note  04/23/2024 Name: Sabrina Sutton MRN: [id number] DOB: 11-Oct-1995  Sabrina Sutton is a 28 y.o. year old female who is a primary care patient of Patient, No Pcp Per and is actively engaged with the care management team. I reached out to Sabrina Sutton by phone today to assist with re-scheduling  with the BSW.  Follow up plan: Telephone appointment with complex care management team member scheduled for:  04/24/24 at 11:00 a.m.   Sabrina Sutton [facility]  [facility], [facility] VBCI Assistant Direct Dial : [phone]  Fax: [phone]

## 2024-04-24 ENCOUNTER — Other Ambulatory Visit: Payer: Self-pay

## 2024-04-24 NOTE — Patient Instructions (Signed)
 Sabrina Sutton - I am sorry I was unable to reach you today for our scheduled appointment. I work with Patient, No Pcp Per and am calling to support your healthcare needs. Please contact me at [phone] at your earliest convenience. I look forward to speaking with you soon.   Thank you,  Orlean Fey, BSW [place]  [facility] Social Worker, [employer] [phone]

## 2024-04-28 ENCOUNTER — Other Ambulatory Visit: Payer: Self-pay
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
## 2024-04-29 ENCOUNTER — Telehealth: Payer: Self-pay

## 2024-04-29 ENCOUNTER — Ambulatory Visit: Admitting: Family Medicine

## 2024-04-29 NOTE — Patient Instructions (Signed)
 Visit Information  Ms. Sabourin was given information about Medicaid Managed Care team care coordination services as a part of their [facility] Community Plan Medicaid benefit.   If you would like to schedule transportation through your [facility], please call the following number at least 2 days in advance of your appointment: [phone]   Rides for urgent appointments can also be made after hours by calling Member Services.  Call the Behavioral Health Crisis Line at [phone], at any time, 24 hours a day, 7 days a week. If you are in danger or need immediate medical attention call 911.  Please see education materials related to diabetes provided by e-mail link.  Patient verbalizes understanding of instructions and care plan provided today and agrees to view in MyChart. Active MyChart status and patient understanding of how to access instructions and care plan via MyChart confirmed with patient.     Telephone follow up appointment with Managed Medicaid care management team member scheduled for:05/08/24 at 1:00pm  Heddy Shutter, RN, MSN, BSN, CCM [place]  [facility], Population Health Case Manager Phone: [phone]

## 2024-04-29 NOTE — Progress Notes (Unsigned)
 Complex Care Management Note Care Guide Note  04/29/2024 Name: Sabrina Sutton MRN: [id number] DOB: 05-Mar-1996   Complex Care Management Outreach Attempts: An unsuccessful telephone outreach was attempted today to offer the patient information about available complex care management services.  Follow Up Plan:  Additional outreach attempts will be made to offer the patient complex care management information and services.   Encounter Outcome:  No Answer  Dreama [facility]  [facility], [facility] VBCI Assistant Direct Dial : [phone]  Fax: [phone]

## 2024-05-01 NOTE — Progress Notes (Signed)
 Complex Care Management Note Care Guide Note  05/01/2024 Name: Sabrina Sutton MRN: [id number] DOB: May 27, 1996   Complex Care Management Outreach Attempts: A second unsuccessful outreach was attempted today to offer the patient with information about available complex care management services.  Follow Up Plan:  No further outreach attempts will be made at this time. We have been unable to contact the patient to offer or enroll patient in complex care management services.  Encounter Outcome:  No Answer  Dreama [facility]wood [facility]  [facility], [facility] VBCI Assistant Direct Dial : [phone]  Fax: [phone]

## 2024-05-03 ENCOUNTER — Encounter (HOSPITAL_COMMUNITY): Payer: Self-pay | Admitting: Emergency Medicine

## 2024-05-03 ENCOUNTER — Ambulatory Visit (HOSPITAL_COMMUNITY): Admission: EM | Admit: 2024-05-03 | Discharge: 2024-05-03 | Disposition: A | Discharge status: Home / Self Care

## 2024-05-03 ENCOUNTER — Other Ambulatory Visit: Payer: Self-pay

## 2024-05-03 DIAGNOSIS — Z113 Encounter for screening for infections with a predominantly sexual mode of transmission: Secondary | ICD-10-CM | POA: Insufficient documentation

## 2024-05-03 DIAGNOSIS — J069 Acute upper respiratory infection, unspecified: Secondary | ICD-10-CM | POA: Insufficient documentation
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
## 2024-05-03 NOTE — ED Notes (Signed)
 Pt requesting to be check for STD too.

## 2024-05-03 NOTE — ED Triage Notes (Signed)
 Pt states she starting having increase cough and vomiting since yesterday. Pt denies any fever or chills.

## 2024-05-03 NOTE — Discharge Instructions (Signed)
  1. Screening for STD (sexually transmitted disease) (Primary) - POCT urine pregnancy collected in UC is still pending. - Cervicovaginal collected in UC and sent to lab for further testing results should be available in 2 to 3 days.  2. Viral URI with cough - Return to work note provided for patient prior to discharge. - Continue taking NyQuil for cough and nasal congestion symptoms. - Continue to monitor symptoms for any changes severity with any escalation of current symptoms or development of new symptoms follow-up for further evaluation and treatment

## 2024-05-05 ENCOUNTER — Ambulatory Visit (HOSPITAL_COMMUNITY): Payer: Self-pay

## 2024-05-05 LAB — CERVICOVAGINAL ANCILLARY ONLY
Bacterial Vaginitis (gardnerella): POSITIVE — AB
Candida Glabrata: NEGATIVE
Candida Vaginitis: POSITIVE — AB
Chlamydia: NEGATIVE
Comment: NEGATIVE
Comment: NEGATIVE
Comment: NEGATIVE
Comment: NEGATIVE
Comment: NEGATIVE
Comment: NORMAL
Neisseria Gonorrhea: NEGATIVE
Trichomonas: NEGATIVE

## 2024-05-05 MED ORDER — FLUCONAZOLE 150 MG PO TABS: 150.0000 mg | ORAL_TABLET | Freq: Once | ORAL | 0 refills | Status: AC

## 2024-05-08 ENCOUNTER — Telehealth: Payer: Self-pay

## 2024-05-08 NOTE — Patient Instructions (Signed)
 Sabrina Sutton - I have attempted to call you three times but have been unsuccessful in reaching you. I work with the Primary care Providers at the [facility] office and am calling to support your healthcare needs. If I can be of assistance to you, please contact me at [phone].     We have been unable to make contact with the patient x 3 attempts. Closing from Complex Care Management..  Please call the Suicide and Crisis Lifeline: 988 call the [place]  National Suicide Prevention Lifeline: 346-794-2405 or TTY: 331-271-3260 TTY (585)459-5553) to talk to a trained counselor if you are experiencing a Mental Health or Behavioral Health Crisis or need someone to talk to.   Heddy Shutter, RN, MSN, BSN, CCM [place]  [facility], Population Health Case Manager Phone: [phone]

## 2024-05-08 NOTE — Patient Outreach (Signed)
 Complex Care Management   Visit Note  05/08/2024  Name:  Sabrina Sutton MRN: [id number] DOB: Nov 02, 1995  Situation: RNCM attempted to outreach to patient regarding care management needs unsuccessfully. Numerous Unsuccessful outreach attempts have been made by the Care Management team.No additional outreaches at this time.   Follow Up Plan:   We have been unable to make contact with the patient x 4 attempts. Closing from Complex Care Management  Heddy Shutter, RN, MSN, BSN, CCM   [facility], Population Health Case Manager Phone: [phone]

## 2024-05-16 ENCOUNTER — Inpatient Hospital Stay (HOSPITAL_BASED_OUTPATIENT_CLINIC_OR_DEPARTMENT_OTHER)
Admission: EM | Admit: 2024-05-16 | Discharge: 2024-05-20 | DRG: 391 | Disposition: A | Discharge status: Home / Self Care | Attending: Internal Medicine | Admitting: Internal Medicine

## 2024-05-16 ENCOUNTER — Emergency Department (HOSPITAL_BASED_OUTPATIENT_CLINIC_OR_DEPARTMENT_OTHER)

## 2024-05-16 ENCOUNTER — Encounter (HOSPITAL_BASED_OUTPATIENT_CLINIC_OR_DEPARTMENT_OTHER): Payer: Self-pay | Admitting: *Deleted

## 2024-05-16 ENCOUNTER — Other Ambulatory Visit: Payer: Self-pay

## 2024-05-16 DIAGNOSIS — R109 Unspecified abdominal pain: Secondary | ICD-10-CM | POA: Diagnosis not present

## 2024-05-16 DIAGNOSIS — Z91419 Personal history of unspecified adult abuse: Secondary | ICD-10-CM

## 2024-05-16 DIAGNOSIS — F17210 Nicotine dependence, cigarettes, uncomplicated: Secondary | ICD-10-CM | POA: Diagnosis present

## 2024-05-16 DIAGNOSIS — Z823 Family history of stroke: Secondary | ICD-10-CM

## 2024-05-16 DIAGNOSIS — K8689 Other specified diseases of pancreas: Secondary | ICD-10-CM | POA: Diagnosis not present

## 2024-05-16 DIAGNOSIS — Z8249 Family history of ischemic heart disease and other diseases of the circulatory system: Secondary | ICD-10-CM

## 2024-05-16 DIAGNOSIS — E876 Hypokalemia: Secondary | ICD-10-CM | POA: Diagnosis not present

## 2024-05-16 DIAGNOSIS — E1010 Type 1 diabetes mellitus with ketoacidosis without coma: Secondary | ICD-10-CM | POA: Diagnosis present

## 2024-05-16 DIAGNOSIS — E1110 Type 2 diabetes mellitus with ketoacidosis without coma: Secondary | ICD-10-CM | POA: Diagnosis present

## 2024-05-16 DIAGNOSIS — D72829 Elevated white blood cell count, unspecified: Secondary | ICD-10-CM

## 2024-05-16 DIAGNOSIS — F1290 Cannabis use, unspecified, uncomplicated: Secondary | ICD-10-CM | POA: Diagnosis present

## 2024-05-16 DIAGNOSIS — K209 Esophagitis, unspecified without bleeding: Secondary | ICD-10-CM | POA: Diagnosis present

## 2024-05-16 DIAGNOSIS — Z833 Family history of diabetes mellitus: Secondary | ICD-10-CM

## 2024-05-16 DIAGNOSIS — E1065 Type 1 diabetes mellitus with hyperglycemia: Secondary | ICD-10-CM

## 2024-05-16 DIAGNOSIS — E860 Dehydration: Secondary | ICD-10-CM | POA: Diagnosis present

## 2024-05-16 DIAGNOSIS — Z23 Encounter for immunization: Secondary | ICD-10-CM

## 2024-05-16 DIAGNOSIS — Z83438 Family history of other disorder of lipoprotein metabolism and other lipidemia: Secondary | ICD-10-CM

## 2024-05-16 DIAGNOSIS — R1116 Cannabis hyperemesis syndrome: Principal | ICD-10-CM | POA: Diagnosis present

## 2024-05-16 DIAGNOSIS — Z720 Tobacco use: Secondary | ICD-10-CM

## 2024-05-16 DIAGNOSIS — R160 Hepatomegaly, not elsewhere classified: Secondary | ICD-10-CM | POA: Diagnosis not present

## 2024-05-16 DIAGNOSIS — R112 Nausea with vomiting, unspecified: Principal | ICD-10-CM | POA: Diagnosis present

## 2024-05-16 DIAGNOSIS — F12188 Cannabis abuse with other cannabis-induced disorder: Secondary | ICD-10-CM | POA: Diagnosis not present

## 2024-05-16 LAB — CBC
HCT: 39.3 % (ref 36.0–46.0)
Hemoglobin: 13.6 g/dL (ref 12.0–15.0)
MCH: 29.2 pg (ref 26.0–34.0)
MCHC: 34.6 g/dL (ref 30.0–36.0)
MCV: 84.3 fL (ref 80.0–100.0)
Platelets: 457 K/uL — ABNORMAL HIGH (ref 150–400)
RBC: 4.66 MIL/uL (ref 3.87–5.11)
RDW: 14.4 % (ref 11.5–15.5)
WBC: 20.8 K/uL — ABNORMAL HIGH (ref 4.0–10.5)
nRBC: 0 % (ref 0.0–0.2)

## 2024-05-16 LAB — COMPREHENSIVE METABOLIC PANEL WITH GFR
ALT: 31 U/L (ref 0–44)
AST: 29 U/L (ref 15–41)
Albumin: 5.1 g/dL — ABNORMAL HIGH (ref 3.5–5.0)
Alkaline Phosphatase: 110 U/L (ref 38–126)
Anion gap: 19 — ABNORMAL HIGH (ref 5–15)
BUN: 19 mg/dL (ref 6–20)
CO2: 26 mmol/L (ref 22–32)
Calcium: 11.2 mg/dL — ABNORMAL HIGH (ref 8.9–10.3)
Chloride: 91 mmol/L — ABNORMAL LOW (ref 98–111)
Creatinine, Ser: 1.03 mg/dL — ABNORMAL HIGH (ref 0.44–1.00)
GFR, Estimated: >60 mL/min (ref >60)
Glucose, Bld: 219 mg/dL — ABNORMAL HIGH (ref 70–99)
Potassium: 2.9 mmol/L — ABNORMAL LOW (ref 3.5–5.1)
Sodium: 136 mmol/L (ref 135–145)
Total Bilirubin: 1 mg/dL (ref 0.0–1.2)
Total Protein: 8.5 g/dL — ABNORMAL HIGH (ref 6.5–8.1)

## 2024-05-16 LAB — I-STAT VENOUS BLOOD GAS, ED
Acid-Base Excess: 9 mmol/L — ABNORMAL HIGH (ref 0.0–2.0)
Bicarbonate: 30.5 mmol/L — ABNORMAL HIGH (ref 20.0–28.0)
Calcium, Ion: 1.17 mmol/L (ref 1.15–1.40)
HCT: 43 % (ref 36.0–46.0)
Hemoglobin: 14.6 g/dL (ref 12.0–15.0)
O2 Saturation: 50 %
Potassium: 3.1 mmol/L — ABNORMAL LOW (ref 3.5–5.1)
Sodium: 133 mmol/L — ABNORMAL LOW (ref 135–145)
TCO2: 32 mmol/L (ref 22–32)
pCO2, Ven: 32.9 mmHg — ABNORMAL LOW (ref 44–60)
pH, Ven: 7.575 — ABNORMAL HIGH (ref 7.25–7.43)
pO2, Ven: 23 mmHg — CL (ref 32–45)

## 2024-05-16 LAB — HCG, SERUM, QUALITATIVE: Preg, Serum: NEGATIVE

## 2024-05-16 LAB — CBG MONITORING, ED
Glucose-Capillary: 172 mg/dL — ABNORMAL HIGH (ref 70–99)
Glucose-Capillary: 134 mg/dL — ABNORMAL HIGH (ref 70–99)

## 2024-05-16 LAB — BETA-HYDROXYBUTYRIC ACID: Beta-Hydroxybutyric Acid: 2.58 mmol/L — ABNORMAL HIGH (ref 0.05–0.27)

## 2024-05-16 LAB — LIPASE, BLOOD: Lipase: 13 U/L (ref 11–51)

## 2024-05-16 MED ORDER — SODIUM CHLORIDE 0.9 % IV BOLUS
1000.0000 mL | Freq: Once | INTRAVENOUS | Status: AC
  Administered 2024-05-16: 1000 mL via INTRAVENOUS

## 2024-05-16 MED ORDER — SODIUM CHLORIDE 0.9 % IV SOLN: INTRAVENOUS | Status: AC

## 2024-05-16 MED ORDER — MORPHINE SULFATE (PF) 4 MG/ML IV SOLN
4.0000 mg | Freq: Once | INTRAVENOUS | Status: AC
  Administered 2024-05-16: 4 mg via INTRAVENOUS
  Filled 2024-05-16: qty 1

## 2024-05-16 MED ORDER — IOHEXOL 300 MG/ML  SOLN
100.0000 mL | Freq: Once | INTRAMUSCULAR | Status: AC | PRN
  Administered 2024-05-16: 80 mL via INTRAVENOUS

## 2024-05-16 MED ORDER — POTASSIUM CHLORIDE 10 MEQ/100ML IV SOLN
10.0000 meq | INTRAVENOUS | Status: AC
  Administered 2024-05-16 (×2): 10 meq via INTRAVENOUS
  Filled 2024-05-16: qty 100

## 2024-05-16 MED ORDER — ONDANSETRON HCL 4 MG/2ML IJ SOLN
4.0000 mg | Freq: Once | INTRAMUSCULAR | Status: AC
  Administered 2024-05-16: 4 mg via INTRAVENOUS
  Filled 2024-05-16: qty 2

## 2024-05-16 MED ORDER — IOHEXOL 300 MG/ML  SOLN
80.0000 mL | Freq: Once | INTRAMUSCULAR | Status: AC | PRN
  Administered 2024-05-16: 80 mL via INTRAVENOUS

## 2024-05-16 MED ORDER — DROPERIDOL 2.5 MG/ML IJ SOLN
1.2500 mg | Freq: Once | INTRAMUSCULAR | Status: AC
  Administered 2024-05-16: 1.25 mg via INTRAVENOUS
  Filled 2024-05-16: qty 2

## 2024-05-16 NOTE — ED Notes (Signed)
 Patient requesting to wait for ultrasound IV in a room.

## 2024-05-16 NOTE — ED Notes (Signed)
 Patient transported to CT

## 2024-05-16 NOTE — ED Notes (Addendum)
 Pt had emesis episode x2 and hyperventilating. PA made aware.
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
## 2024-05-16 NOTE — ED Triage Notes (Signed)
 Pt to ED with generalized abd pain with vomiting since last night. Hx of Type 1 Diabetes but reports glucometer is not working but she has been taking medication as prescribed. Family at bedside also reports patient was recently admitted for abd pain and vomiting and was told she had a liver infection

## 2024-05-16 NOTE — Plan of Care (Signed)
 Plan of Care Note for accepted transfer   Patient name: Sabrina Sutton FMW:[id number] DOB: 28-Aug-1995  Facility requesting transfer: [facility] Requesting Provider: Lang Norleen POUR, PA-C  Reason for transfer: Intractable nausea and vomiting Facility course: 28 year old female with history of type 1 diabetes, cannabis abuse, depression, EBV presented to the ED with complaints of abdominal pain and vomiting since yesterday night.  Tachycardic but afebrile.  Not hypotensive.  Labs showing WBC count 20.8, potassium 2.9, glucose 219, bicarb 26, anion gap 19, creatinine 1.0, calcium  11.2, albumin 5.1, total protein 8.5, normal lipase and LFTs, beta hydroxybutyric acid 2.58, VBG with pH 7.575, serum pregnancy test negative.  CT abdomen pelvis showing mild wall thickening of the distal esophagus suspicious for reflux or esophagitis.  Patient was given droperidol , morphine , Zofran , IV potassium, and 2 L normal saline boluses.  Glucose improved to 134 after IV fluids.  Plan of care: The patient is accepted for admission to Progressive unit at [facility].  [facility] will assume care on arrival to accepting facility. Until arrival, care as per EDP. However, [facility] available 24/7 for questions and assistance.  Check [email or web address] for on-call coverage.  Nursing staff, please call [facility] Admits & Consults System-Wide number under Amion on patient's arrival so appropriate admitting provider can evaluate the pt.

## 2024-05-17 DIAGNOSIS — R112 Nausea with vomiting, unspecified: Secondary | ICD-10-CM

## 2024-05-17 LAB — COMPREHENSIVE METABOLIC PANEL WITH GFR
ALT: 30 U/L (ref 0–44)
AST: 31 U/L (ref 15–41)
Albumin: 4.2 g/dL (ref 3.5–5.0)
Alkaline Phosphatase: 96 U/L (ref 38–126)
Anion gap: 16 — ABNORMAL HIGH (ref 5–15)
BUN: 14 mg/dL (ref 6–20)
CO2: 22 mmol/L (ref 22–32)
Calcium: 8.8 mg/dL — ABNORMAL LOW (ref 8.9–10.3)
Chloride: 96 mmol/L — ABNORMAL LOW (ref 98–111)
Creatinine, Ser: 0.87 mg/dL (ref 0.44–1.00)
GFR, Estimated: >60 mL/min (ref >60)
Glucose, Bld: 247 mg/dL — ABNORMAL HIGH (ref 70–99)
Potassium: 3.1 mmol/L — ABNORMAL LOW (ref 3.5–5.1)
Sodium: 133 mmol/L — ABNORMAL LOW (ref 135–145)
Total Bilirubin: 0.8 mg/dL (ref 0.0–1.2)
Total Protein: 7 g/dL (ref 6.5–8.1)

## 2024-05-17 LAB — DIFFERENTIAL
Abs Immature Granulocytes: 0.1 K/uL — ABNORMAL HIGH (ref 0.00–0.07)
Basophils Absolute: 0.1 K/uL (ref 0.0–0.1)
Basophils Relative: 0 %
Eosinophils Absolute: 0 K/uL (ref 0.0–0.5)
Eosinophils Relative: 0 %
Immature Granulocytes: 1 %
Lymphocytes Relative: 15 %
Lymphs Abs: 2.5 K/uL (ref 0.7–4.0)
Monocytes Absolute: 0.8 K/uL (ref 0.1–1.0)
Monocytes Relative: 5 %
Neutro Abs: 12.9 K/uL — ABNORMAL HIGH (ref 1.7–7.7)
Neutrophils Relative %: 79 %

## 2024-05-17 LAB — URINALYSIS, ROUTINE W REFLEX MICROSCOPIC
Bacteria, UA: NONE SEEN
Bilirubin Urine: NEGATIVE
Bilirubin Urine: NEGATIVE
Glucose, UA: >1000 mg/dL — AB
Glucose, UA: >=500 mg/dL — AB
Hgb urine dipstick: NEGATIVE
Ketones, ur: >80 mg/dL — AB
Ketones, ur: 20 mg/dL — AB
Leukocytes,Ua: NEGATIVE
Nitrite: NEGATIVE
Nitrite: NEGATIVE
Protein, ur: 30 mg/dL — AB
Protein, ur: NEGATIVE mg/dL
Specific Gravity, Urine: >1.046 — ABNORMAL HIGH (ref 1.005–1.030)
Specific Gravity, Urine: 1.01 (ref 1.005–1.030)
pH: 6 (ref 5.0–8.0)
pH: 6 (ref 5.0–8.0)

## 2024-05-17 LAB — BETA-HYDROXYBUTYRIC ACID: Beta-Hydroxybutyric Acid: 1.55 mmol/L — ABNORMAL HIGH (ref 0.05–0.27)

## 2024-05-17 LAB — CBC
HCT: 35.5 % — ABNORMAL LOW (ref 36.0–46.0)
Hemoglobin: 11.9 g/dL — ABNORMAL LOW (ref 12.0–15.0)
MCH: 29.1 pg (ref 26.0–34.0)
MCHC: 33.5 g/dL (ref 30.0–36.0)
MCV: 86.8 fL (ref 80.0–100.0)
Platelets: 375 K/uL (ref 150–400)
RBC: 4.09 MIL/uL (ref 3.87–5.11)
RDW: 14.6 % (ref 11.5–15.5)
WBC: 16.4 K/uL — ABNORMAL HIGH (ref 4.0–10.5)
nRBC: 0 % (ref 0.0–0.2)

## 2024-05-17 LAB — HEMOGLOBIN A1C
Hgb A1c MFr Bld: 9.7 % — ABNORMAL HIGH (ref 4.8–5.6)
Mean Plasma Glucose: 231.69 mg/dL

## 2024-05-17 LAB — URINE DRUG SCREEN
Amphetamines: NEGATIVE
Barbiturates: NEGATIVE
Benzodiazepines: NEGATIVE
Cocaine: NEGATIVE
Fentanyl: NEGATIVE
Methadone Scn, Ur: NEGATIVE
Opiates: POSITIVE — AB
Tetrahydrocannabinol: POSITIVE — AB

## 2024-05-17 LAB — MAGNESIUM
Magnesium: 1.7 mg/dL (ref 1.7–2.4)
Magnesium: 1.9 mg/dL (ref 1.7–2.4)

## 2024-05-17 LAB — LACTIC ACID, PLASMA
Lactic Acid, Venous: 2 mmol/L (ref 0.5–1.9)
Lactic Acid, Venous: 1.6 mmol/L (ref 0.5–1.9)

## 2024-05-17 LAB — CBG MONITORING, ED
Glucose-Capillary: 251 mg/dL — ABNORMAL HIGH (ref 70–99)
Glucose-Capillary: 355 mg/dL — ABNORMAL HIGH (ref 70–99)

## 2024-05-17 LAB — GLUCOSE, CAPILLARY
Glucose-Capillary: 202 mg/dL — ABNORMAL HIGH (ref 70–99)
Glucose-Capillary: 174 mg/dL — ABNORMAL HIGH (ref 70–99)
Glucose-Capillary: 123 mg/dL — ABNORMAL HIGH (ref 70–99)

## 2024-05-17 MED ORDER — METOCLOPRAMIDE HCL 5 MG/ML IJ SOLN
5.0000 mg | Freq: Four times a day (QID) | INTRAMUSCULAR | Status: DC | PRN
  Administered 2024-05-17: 5 mg via INTRAVENOUS
  Filled 2024-05-17: qty 2

## 2024-05-17 MED ORDER — ONDANSETRON HCL 4 MG/2ML IJ SOLN
4.0000 mg | Freq: Once | INTRAMUSCULAR | Status: AC
  Administered 2024-05-17: 4 mg via INTRAVENOUS
  Filled 2024-05-17: qty 2

## 2024-05-17 MED ORDER — PNEUMOCOCCAL 20-VAL CONJ VACC 0.5 ML IM SUSY
0.5000 mL | PREFILLED_SYRINGE | INTRAMUSCULAR | Status: AC
  Administered 2024-05-18: 0.5 mL via INTRAMUSCULAR
  Filled 2024-05-17: qty 0.5

## 2024-05-17 MED ORDER — PROCHLORPERAZINE EDISYLATE 10 MG/2ML IJ SOLN
5.0000 mg | Freq: Once | INTRAMUSCULAR | Status: AC
  Administered 2024-05-17: 5 mg via INTRAVENOUS
  Filled 2024-05-17: qty 2

## 2024-05-17 MED ORDER — METOCLOPRAMIDE HCL 5 MG/ML IJ SOLN
10.0000 mg | Freq: Four times a day (QID) | INTRAMUSCULAR | Status: DC | PRN
  Administered 2024-05-17 – 2024-05-18 (×3): 10 mg via INTRAVENOUS
  Filled 2024-05-17 (×3): qty 2

## 2024-05-17 MED ORDER — INSULIN GLARGINE-YFGN 100 UNIT/ML ~~LOC~~ SOLN
5.0000 [IU] | Freq: Every day | SUBCUTANEOUS | Status: DC
  Administered 2024-05-17: 5 [IU] via SUBCUTANEOUS
  Filled 2024-05-17: qty 0.05

## 2024-05-17 MED ORDER — PANTOPRAZOLE SODIUM 40 MG IV SOLR
40.0000 mg | Freq: Two times a day (BID) | INTRAVENOUS | Status: DC
  Administered 2024-05-17 – 2024-05-20 (×7): 40 mg via INTRAVENOUS
  Filled 2024-05-17 (×7): qty 10

## 2024-05-17 MED ORDER — POTASSIUM CHLORIDE 10 MEQ/100ML IV SOLN
10.0000 meq | INTRAVENOUS | Status: AC
  Administered 2024-05-17 (×4): 10 meq via INTRAVENOUS
  Filled 2024-05-17 (×4): qty 100

## 2024-05-17 MED ORDER — ONDANSETRON HCL 4 MG/2ML IJ SOLN
4.0000 mg | Freq: Four times a day (QID) | INTRAMUSCULAR | Status: DC | PRN
  Administered 2024-05-17 – 2024-05-19 (×3): 4 mg via INTRAVENOUS
  Filled 2024-05-17 (×3): qty 2

## 2024-05-17 MED ORDER — INSULIN ASPART 100 UNIT/ML IJ SOLN
0.0000 [IU] | Freq: Three times a day (TID) | INTRAMUSCULAR | Status: DC
  Administered 2024-05-17: 3 [IU] via SUBCUTANEOUS
  Administered 2024-05-17: 2 [IU] via SUBCUTANEOUS
  Administered 2024-05-18: 9 [IU] via SUBCUTANEOUS

## 2024-05-17 MED ORDER — INSULIN ASPART 100 UNIT/ML IJ SOLN: 0.0000 [IU] | Freq: Every day | INTRAMUSCULAR | Status: DC

## 2024-05-17 MED ORDER — INSULIN ASPART 100 UNIT/ML IJ SOLN
12.0000 [IU] | Freq: Once | INTRAMUSCULAR | Status: AC
  Administered 2024-05-17: 12 [IU] via SUBCUTANEOUS

## 2024-05-17 MED ORDER — ACETAMINOPHEN 325 MG PO TABS
650.0000 mg | ORAL_TABLET | Freq: Four times a day (QID) | ORAL | Status: DC | PRN
  Administered 2024-05-17: 650 mg via ORAL
  Filled 2024-05-17: qty 2

## 2024-05-17 MED ORDER — SERTRALINE HCL 100 MG PO TABS: 100.0000 mg | ORAL_TABLET | Freq: Every day | ORAL | Status: DC

## 2024-05-17 MED ORDER — MORPHINE SULFATE (PF) 4 MG/ML IV SOLN
4.0000 mg | Freq: Once | INTRAVENOUS | Status: AC
  Administered 2024-05-17: 4 mg via INTRAVENOUS
  Filled 2024-05-17: qty 1

## 2024-05-17 MED ORDER — HYDROMORPHONE HCL 1 MG/ML IJ SOLN
0.5000 mg | INTRAMUSCULAR | Status: DC | PRN
  Administered 2024-05-17 – 2024-05-18 (×2): 0.5 mg via INTRAVENOUS
  Filled 2024-05-17 (×2): qty 0.5

## 2024-05-17 MED ORDER — ACETAMINOPHEN 650 MG RE SUPP: 650.0000 mg | Freq: Four times a day (QID) | RECTAL | Status: DC | PRN

## 2024-05-17 MED ORDER — ENOXAPARIN SODIUM 40 MG/0.4ML IJ SOSY
40.0000 mg | PREFILLED_SYRINGE | INTRAMUSCULAR | Status: DC
  Administered 2024-05-17 – 2024-05-19 (×3): 40 mg via SUBCUTANEOUS
  Filled 2024-05-17 (×3): qty 0.4

## 2024-05-17 MED ORDER — INSULIN ASPART 100 UNIT/ML IJ SOLN
2.0000 [IU] | Freq: Three times a day (TID) | INTRAMUSCULAR | Status: DC
  Administered 2024-05-17 – 2024-05-18 (×3): 2 [IU] via SUBCUTANEOUS

## 2024-05-17 NOTE — ED Notes (Signed)
She leaves with [facility] at this time.

## 2024-05-17 NOTE — ED Notes (Signed)
 She tells me her nausea is gone. She had had one episode of emesis earlier.
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
## 2024-05-17 NOTE — ED Notes (Signed)
 I have just given report to Inocente, CHARITY FUNDRAISER at [facility].

## 2024-05-17 NOTE — ED Notes (Signed)
 [facility] with cl called for transport

## 2024-05-17 NOTE — Plan of Care (Incomplete)
  Problem: Education: Goal: Knowledge of General Education information will improve Description: Including pain rating scale, medication(s)/side effects and non-pharmacologic comfort measures Outcome: Progressing   Problem: Health Behavior/Discharge Planning: Goal: Ability to manage health-related needs will improve Outcome: Progressing   Problem: Clinical Measurements: Goal: Ability to maintain clinical measurements within normal limits will improve Outcome: Progressing Goal: Diagnostic test results will improve Outcome: Progressing   Problem: Coping: Goal: Level of anxiety will decrease Outcome: Progressing   Problem: Pain Managment: Goal: General experience of comfort will improve and/or be controlled Outcome: Progressing   Problem: Fluid Volume: Goal: Ability to maintain a balanced intake and output will improve Outcome: Progressing   Problem: Nutrition: Goal: Adequate nutrition will be maintained Outcome: Not Progressing   Problem: Clinical Measurements: Goal: Will remain free from infection Outcome: Adequate for Discharge Goal: Respiratory complications will improve Outcome: Adequate for Discharge Goal: Cardiovascular complication will be avoided Outcome: Adequate for Discharge   Problem: Activity: Goal: Risk for activity intolerance will decrease Outcome: Adequate for Discharge   Problem: Elimination: Goal: Will not experience complications related to bowel motility Outcome: Adequate for Discharge Goal: Will not experience complications related to urinary retention Outcome: Adequate for Discharge   Problem: Skin Integrity: Goal: Risk for impaired skin integrity will decrease Outcome: Adequate for Discharge   Problem: Coping: Goal: Ability to adjust to condition or change in health will improve Outcome: Adequate for Discharge

## 2024-05-18 ENCOUNTER — Observation Stay (HOSPITAL_COMMUNITY)

## 2024-05-18 ENCOUNTER — Other Ambulatory Visit: Payer: Self-pay

## 2024-05-18 DIAGNOSIS — E876 Hypokalemia: Secondary | ICD-10-CM

## 2024-05-18 DIAGNOSIS — E860 Dehydration: Secondary | ICD-10-CM | POA: Diagnosis present

## 2024-05-18 DIAGNOSIS — D72829 Elevated white blood cell count, unspecified: Secondary | ICD-10-CM

## 2024-05-18 DIAGNOSIS — E1065 Type 1 diabetes mellitus with hyperglycemia: Secondary | ICD-10-CM | POA: Diagnosis not present

## 2024-05-18 DIAGNOSIS — Z720 Tobacco use: Secondary | ICD-10-CM | POA: Diagnosis not present

## 2024-05-18 DIAGNOSIS — Z91419 Personal history of unspecified adult abuse: Secondary | ICD-10-CM | POA: Diagnosis not present

## 2024-05-18 DIAGNOSIS — Z833 Family history of diabetes mellitus: Secondary | ICD-10-CM | POA: Diagnosis not present

## 2024-05-18 DIAGNOSIS — F1290 Cannabis use, unspecified, uncomplicated: Secondary | ICD-10-CM | POA: Diagnosis present

## 2024-05-18 DIAGNOSIS — F17210 Nicotine dependence, cigarettes, uncomplicated: Secondary | ICD-10-CM | POA: Diagnosis present

## 2024-05-18 DIAGNOSIS — E1010 Type 1 diabetes mellitus with ketoacidosis without coma: Secondary | ICD-10-CM | POA: Diagnosis not present

## 2024-05-18 DIAGNOSIS — Z823 Family history of stroke: Secondary | ICD-10-CM | POA: Diagnosis not present

## 2024-05-18 DIAGNOSIS — R112 Nausea with vomiting, unspecified: Secondary | ICD-10-CM | POA: Diagnosis not present

## 2024-05-18 DIAGNOSIS — Z83438 Family history of other disorder of lipoprotein metabolism and other lipidemia: Secondary | ICD-10-CM | POA: Diagnosis not present

## 2024-05-18 DIAGNOSIS — Z8249 Family history of ischemic heart disease and other diseases of the circulatory system: Secondary | ICD-10-CM | POA: Diagnosis not present

## 2024-05-18 DIAGNOSIS — Z23 Encounter for immunization: Secondary | ICD-10-CM | POA: Diagnosis not present

## 2024-05-18 DIAGNOSIS — Z452 Encounter for adjustment and management of vascular access device: Secondary | ICD-10-CM | POA: Diagnosis not present

## 2024-05-18 DIAGNOSIS — K209 Esophagitis, unspecified without bleeding: Secondary | ICD-10-CM | POA: Diagnosis not present

## 2024-05-18 DIAGNOSIS — R1116 Cannabis hyperemesis syndrome: Secondary | ICD-10-CM | POA: Diagnosis present

## 2024-05-18 LAB — URINALYSIS, COMPLETE (UACMP) WITH MICROSCOPIC
Bilirubin Urine: NEGATIVE
Glucose, UA: >=500 mg/dL — AB
Ketones, ur: 20 mg/dL — AB
Leukocytes,Ua: NEGATIVE
Nitrite: NEGATIVE
Protein, ur: NEGATIVE mg/dL
Specific Gravity, Urine: 1.002 — ABNORMAL LOW (ref 1.005–1.030)
pH: 6 (ref 5.0–8.0)

## 2024-05-18 LAB — BASIC METABOLIC PANEL WITH GFR
Anion gap: 16 — ABNORMAL HIGH (ref 5–15)
Anion gap: 11 (ref 5–15)
Anion gap: 10 (ref 5–15)
BUN: 9 mg/dL (ref 6–20)
BUN: 8 mg/dL (ref 6–20)
BUN: 6 mg/dL (ref 6–20)
CO2: 16 mmol/L — ABNORMAL LOW (ref 22–32)
CO2: 20 mmol/L — ABNORMAL LOW (ref 22–32)
CO2: 23 mmol/L (ref 22–32)
Calcium: 8.2 mg/dL — ABNORMAL LOW (ref 8.9–10.3)
Calcium: 8.3 mg/dL — ABNORMAL LOW (ref 8.9–10.3)
Calcium: 8.3 mg/dL — ABNORMAL LOW (ref 8.9–10.3)
Chloride: 100 mmol/L (ref 98–111)
Chloride: 100 mmol/L (ref 98–111)
Chloride: 99 mmol/L (ref 98–111)
Creatinine, Ser: 0.68 mg/dL (ref 0.44–1.00)
Creatinine, Ser: 0.74 mg/dL (ref 0.44–1.00)
Creatinine, Ser: 0.76 mg/dL (ref 0.44–1.00)
GFR, Estimated: >60 mL/min (ref >60)
GFR, Estimated: >60 mL/min (ref >60)
GFR, Estimated: >60 mL/min (ref >60)
Glucose, Bld: 162 mg/dL — ABNORMAL HIGH (ref 70–99)
Glucose, Bld: 544 mg/dL (ref 70–99)
Glucose, Bld: 198 mg/dL — ABNORMAL HIGH (ref 70–99)
Potassium: 3.3 mmol/L — ABNORMAL LOW (ref 3.5–5.1)
Potassium: 3.2 mmol/L — ABNORMAL LOW (ref 3.5–5.1)
Potassium: 3.4 mmol/L — ABNORMAL LOW (ref 3.5–5.1)
Sodium: 132 mmol/L — ABNORMAL LOW (ref 135–145)
Sodium: 131 mmol/L — ABNORMAL LOW (ref 135–145)
Sodium: 131 mmol/L — ABNORMAL LOW (ref 135–145)

## 2024-05-18 LAB — CBC
HCT: 35.6 % — ABNORMAL LOW (ref 36.0–46.0)
Hemoglobin: 12.2 g/dL (ref 12.0–15.0)
MCH: 29.7 pg (ref 26.0–34.0)
MCHC: 34.3 g/dL (ref 30.0–36.0)
MCV: 86.6 fL (ref 80.0–100.0)
Platelets: 367 K/uL (ref 150–400)
RBC: 4.11 MIL/uL (ref 3.87–5.11)
RDW: 14.7 % (ref 11.5–15.5)
WBC: 16.5 K/uL — ABNORMAL HIGH (ref 4.0–10.5)
nRBC: 0 % (ref 0.0–0.2)

## 2024-05-18 LAB — GLUCOSE, CAPILLARY
Glucose-Capillary: 426 mg/dL — ABNORMAL HIGH (ref 70–99)
Glucose-Capillary: 302 mg/dL — ABNORMAL HIGH (ref 70–99)
Glucose-Capillary: 208 mg/dL — ABNORMAL HIGH (ref 70–99)
Glucose-Capillary: 157 mg/dL — ABNORMAL HIGH (ref 70–99)
Glucose-Capillary: 136 mg/dL — ABNORMAL HIGH (ref 70–99)
Glucose-Capillary: 138 mg/dL — ABNORMAL HIGH (ref 70–99)
Glucose-Capillary: 126 mg/dL — ABNORMAL HIGH (ref 70–99)
Glucose-Capillary: 126 mg/dL — ABNORMAL HIGH (ref 70–99)
Glucose-Capillary: 140 mg/dL — ABNORMAL HIGH (ref 70–99)
Glucose-Capillary: 168 mg/dL — ABNORMAL HIGH (ref 70–99)
Glucose-Capillary: 179 mg/dL — ABNORMAL HIGH (ref 70–99)
Glucose-Capillary: 189 mg/dL — ABNORMAL HIGH (ref 70–99)
Glucose-Capillary: 180 mg/dL — ABNORMAL HIGH (ref 70–99)
Glucose-Capillary: 184 mg/dL — ABNORMAL HIGH (ref 70–99)
Glucose-Capillary: 182 mg/dL — ABNORMAL HIGH (ref 70–99)

## 2024-05-18 LAB — COMPREHENSIVE METABOLIC PANEL WITH GFR
ALT: 25 U/L (ref 0–44)
AST: 20 U/L (ref 15–41)
Albumin: 4 g/dL (ref 3.5–5.0)
Alkaline Phosphatase: 92 U/L (ref 38–126)
Anion gap: 24 — ABNORMAL HIGH (ref 5–15)
BUN: 11 mg/dL (ref 6–20)
CO2: 14 mmol/L — ABNORMAL LOW (ref 22–32)
Calcium: 8.9 mg/dL (ref 8.9–10.3)
Chloride: 93 mmol/L — ABNORMAL LOW (ref 98–111)
Creatinine, Ser: 0.86 mg/dL (ref 0.44–1.00)
GFR, Estimated: >60 mL/min (ref >60)
Glucose, Bld: 454 mg/dL — ABNORMAL HIGH (ref 70–99)
Potassium: 3.7 mmol/L (ref 3.5–5.1)
Sodium: 130 mmol/L — ABNORMAL LOW (ref 135–145)
Total Bilirubin: 1 mg/dL (ref 0.0–1.2)
Total Protein: 6.6 g/dL (ref 6.5–8.1)

## 2024-05-18 LAB — BETA-HYDROXYBUTYRIC ACID
Beta-Hydroxybutyric Acid: 1.63 mmol/L — ABNORMAL HIGH (ref 0.05–0.27)
Beta-Hydroxybutyric Acid: 1.33 mmol/L — ABNORMAL HIGH (ref 0.05–0.27)
Beta-Hydroxybutyric Acid: 0.79 mmol/L — ABNORMAL HIGH (ref 0.05–0.27)

## 2024-05-18 LAB — MAGNESIUM: Magnesium: 1.5 mg/dL — ABNORMAL LOW (ref 1.7–2.4)

## 2024-05-18 LAB — PHOSPHORUS: Phosphorus: 1.5 mg/dL — ABNORMAL LOW (ref 2.5–4.6)

## 2024-05-18 LAB — MRSA NEXT GEN BY PCR, NASAL: MRSA by PCR Next Gen: NOT DETECTED

## 2024-05-18 MED ORDER — POTASSIUM CHLORIDE CRYS ER 10 MEQ PO TBCR
40.0000 meq | EXTENDED_RELEASE_TABLET | Freq: Once | ORAL | Status: AC
  Administered 2024-05-18: 40 meq via ORAL
  Filled 2024-05-18: qty 4

## 2024-05-18 MED ORDER — POTASSIUM PHOSPHATES 15 MMOLE/5ML IV SOLN
30.0000 mmol | Freq: Once | INTRAVENOUS | Status: AC
  Administered 2024-05-18: 30 mmol via INTRAVENOUS
  Filled 2024-05-18: qty 10

## 2024-05-18 MED ORDER — SODIUM CHLORIDE 0.9% FLUSH
10.0000 mL | Freq: Two times a day (BID) | INTRAVENOUS | Status: DC
  Administered 2024-05-18: 10 mL
  Administered 2024-05-18: 40 mL
  Administered 2024-05-19 – 2024-05-20 (×3): 10 mL

## 2024-05-18 MED ORDER — FLUCONAZOLE 100 MG PO TABS
200.0000 mg | ORAL_TABLET | Freq: Every day | ORAL | Status: DC
  Administered 2024-05-19 – 2024-05-20 (×2): 200 mg via ORAL
  Filled 2024-05-18 (×2): qty 2

## 2024-05-18 MED ORDER — INFLUENZA VIRUS VACC SPLIT PF (FLUZONE) 0.5 ML IM SUSY
0.5000 mL | PREFILLED_SYRINGE | INTRAMUSCULAR | Status: AC
  Administered 2024-05-19: 0.5 mL via INTRAMUSCULAR
  Filled 2024-05-18: qty 0.5

## 2024-05-18 MED ORDER — SODIUM CHLORIDE 0.9% FLUSH: 10.0000 mL | INTRAVENOUS | Status: DC | PRN

## 2024-05-18 MED ORDER — DEXTROSE IN LACTATED RINGERS 5 % IV SOLN: INTRAVENOUS | Status: AC

## 2024-05-18 MED ORDER — MAGNESIUM SULFATE 4 GM/100ML IV SOLN
4.0000 g | Freq: Once | INTRAVENOUS | Status: AC
  Administered 2024-05-18: 4 g via INTRAVENOUS
  Filled 2024-05-18: qty 100

## 2024-05-18 MED ORDER — LACTATED RINGERS IV SOLN: INTRAVENOUS | Status: DC

## 2024-05-18 MED ORDER — FLUCONAZOLE IN SODIUM CHLORIDE 200-0.9 MG/100ML-% IV SOLN
200.0000 mg | Freq: Once | INTRAVENOUS | Status: AC
  Administered 2024-05-18: 200 mg via INTRAVENOUS
  Filled 2024-05-18 (×2): qty 100

## 2024-05-18 MED ORDER — INSULIN REGULAR(HUMAN) IN NACL 100-0.9 UT/100ML-% IV SOLN
INTRAVENOUS | Status: AC
  Administered 2024-05-18: 8 [IU]/h via INTRAVENOUS
  Administered 2024-05-18: 3 [IU]/h via INTRAVENOUS
  Filled 2024-05-18 (×3): qty 100

## 2024-05-18 MED ORDER — DEXTROSE 50 % IV SOLN: 0.0000 mL | INTRAVENOUS | Status: DC | PRN

## 2024-05-18 MED ORDER — LACTATED RINGERS IV BOLUS
20.0000 mL/kg | Freq: Once | INTRAVENOUS | Status: AC
  Administered 2024-05-18: 1000 mL via INTRAVENOUS

## 2024-05-18 MED ORDER — CHLORHEXIDINE GLUCONATE CLOTH 2 % EX PADS
6.0000 | MEDICATED_PAD | Freq: Every day | CUTANEOUS | Status: DC
  Administered 2024-05-18 – 2024-05-19 (×2): 6 via TOPICAL

## 2024-05-18 NOTE — Progress Notes (Signed)
 Called ICU 1222 nurse at [phone] and gave report.

## 2024-05-18 NOTE — Progress Notes (Signed)
 Peripherally Inserted Central Catheter Placement  The IV Nurse has discussed with the patient and/or persons authorized to consent for the patient, the purpose of this procedure and the potential benefits and risks involved with this procedure.  The benefits include less needle sticks, lab draws from the catheter, and the patient may be discharged home with the catheter. Risks include, but not limited to, infection, bleeding, blood clot (thrombus formation), and puncture of an artery; nerve damage and irregular heartbeat and possibility to perform a PICC exchange if needed/ordered by physician.  Alternatives to this procedure were also discussed.  Bard Power PICC patient education guide, fact sheet on infection prevention and patient information card has been provided to patient /or left at bedside.    PICC Placement Documentation  PICC Double Lumen 05/18/24 Right Brachial 35 cm 1 cm (Active)  Indication for Insertion or Continuance of Line Vasoactive infusions;Limited venous access - need for IV therapy >5 days (PICC only) 05/18/24 1121  Exposed Catheter (cm) 1 cm 05/18/24 1121  Site Assessment Clean, Dry, Intact 05/18/24 1121  Lumen #1 Status Flushed;Saline locked;Blood return noted 05/18/24 1121  Lumen #2 Status Flushed;Saline locked;Blood return noted 05/18/24 1121  Dressing Type Transparent;Securing device 05/18/24 1121  Dressing Status Antimicrobial disc/dressing in place;Clean, Dry, Intact 05/18/24 1121  Line Care Connections checked and tightened 05/18/24 1121  Line Adjustment (NICU/IV Team Only) No 05/18/24 1121  Dressing Intervention New dressing;Adhesive placed at insertion site (IV team only);Adhesive placed around edges of dressing (IV team/ICU RN only) 05/18/24 1121  Dressing Change Due 05/25/24 05/18/24 1121       Sabrina Sutton 05/18/2024, 11:22 AM

## 2024-05-18 NOTE — Inpatient Diabetes Management (Signed)
 Inpatient Diabetes Program Recommendations  AACE/ADA: New Consensus Statement on Inpatient Glycemic Control  Target Ranges:  Prepandial:   less than 140 mg/dL      Peak postprandial:   less than 180 mg/dL (1-2 hours)      Critically ill patients:  140 - 180 mg/dL    Latest Reference Range & Units 05/18/24 05:31  CO2 22 - 32 mmol/L 14 (L)  Glucose 70 - 99 mg/dL 545 (H)  BUN 6 - 20 mg/dL 11  Creatinine 9.55 - 8.99 mg/dL 9.13  Calcium  8.9 - 10.3 mg/dL 8.9  Anion gap 5 - 15  24 (H)   Review of Glycemic Control  Diabetes history: DM1 Outpatient Diabetes medications: Lantus  5 units BID, Novolog  4-10 units TID with meals Current orders for Inpatient glycemic control: IV insulin   Inpatient Diabetes Program Recommendations:    Insulin : Noted labs this morning indicating acidosis, lab glucose 426 mg/dl, and IV insulin  was ordered at 8:13 am today.  NOTE: Noted consult for Diabetes Coordinator. Diabetes Coordinator is not on campus over the weekend but available by pager from 8am to 5pm for questions or concerns. Patient admitted with N/V, abdominal pain. Patient was recently inpatient 04/05/24-04/07/24 and inpatient diabetes coordinator spoke with patient on 04/06/24 during that admission.   Thanks, Earnie Gainer, RN, MSN, CDCES Diabetes Coordinator Inpatient Diabetes Program [phone] (Team Pager from 8am to 5pm)

## 2024-05-18 NOTE — Progress Notes (Signed)
 PROGRESS NOTE    Sabrina Sutton  FMW:[id number] DOB: [birth year] DOA: 05/16/2024 PCP: Patient, No Pcp Per    Chief Complaint  Patient presents with   Abdominal Pain   Emesis    Brief Narrative:  Patient is a 28 year old female history of type 1 diabetes, depression, ongoing marijuana use and tobacco use presenting with abdominal pain, nausea and vomiting.  CT abdomen and pelvis done concerning for esophagitis.  EKG with normal sinus rhythm.  UDS positive for opiates and marijuana.  Patient noted to be hypokalemic on admission, bicarb of 26 initially and anion gap of 19 with a white count of 20.8.  Patient admitted for further evaluation and management.  Repeat labs obtained with concerns for DKA, patient with CBG in the 400s on 05/18/2024 and placed on the Endo tool and transferred to the stepdown unit.   Assessment & Plan:   Principal Problem:   DKA (diabetic ketoacidosis) (HCC) Active Problems:   Hypokalemia   Intractable nausea and vomiting   Leukocytosis   Tobacco abuse   Hypercalcemia  #1 DKA in type I diabetic -Patient noted with hemoglobin A1c of 10.3 in 01/2024. - Repeat hemoglobin A1c of 9.7. - Patient presented with intractable nausea and vomiting. - Initial labs obtained on presentation with a bicarb of 26, anion gap of 19, glucose of 219 with a potassium of 2.9. - Beta hydroxybutyric acid noted at 2.58>>> 1.55. - Repeat labs obtained with a sodium of 130, bicarb of 14, glucose of 454, anion gap of 24. - Patient in DKA and as such we will transfer to the stepdown unit and placed on the Endo tool. - Repeat UA with cultures and sensitivities to rule out infectious etiology. - Check a chest x-ray. - Initial UA done with budding yeast noted to be present with trace leukocytes, nitrite negative, 11-20 WBCs. - Check BMET now and every 4 hours. - Check a beta hydroxybutyric acid now and every 4 hours, magnesium  level, phosphorus level. - Place on the Endo tool, 1 L IV  fluid bolus, aggressive hydration. - Bowel rest, supportive care. - Due to limited venous access, PICC line to be placed. - Consult with diabetic coordinator.  2.  Intractable nausea and vomiting -Likely secondary to problem #1 versus cannabinoid hyperemesis. - Patient denies any diarrhea any prior history of gastroparesis - Clinical improvement. - CT abdomen and pelvis with concerns of esophagitis. - Continue IV fluids, IV PPI. - Marijuana cessation stressed to patient. - Continue IV Reglan  as needed nausea and vomiting. - Zofran  for refractory nausea and vomiting. - Supportive care.  3.  Hypokalemia -Likely secondary to GI losses. -Potassium at 3.7 this morning. -Check a magnesium , phosphorus level.  4.  Hypercalcemia -Secondary to dehydration. - Resolved with hydration.  5.  Leukocytosis -Felt likely secondary to demargination and hemoconcentration. - Patient afebrile. - Repeat UA with cultures and sensitivities. - Check a chest x-ray. - Hold off on antibiotics at this time.  6.  Marijuana and tobacco use -Patient noted to smoke 3 to 4 cigarettes a day and 1-2 joints of marijuana a day. - Cessation stressed to patient.    DVT prophylaxis: Lovenox  Code Status: Full Family Communication: Updated patient.  No family at bedside. Disposition: Transfer to stepdown unit.  Likely home when clinically improved with resolution of DKA and nausea and vomiting.  Status is: Observation The patient remains OBS appropriate and will d/c before 2 midnights.   Consultants:  None  Procedures:  CT abdomen pelvis 05/16/2024 Chest  x-ray pending  Antimicrobials:  Anti-infectives (From admission, onward)    Start     Dose/Rate Route Frequency Ordered Stop   05/19/24 1000  fluconazole  (DIFLUCAN ) tablet 200 mg        200 mg Oral Daily 05/18/24 0830 05/25/24 0959   05/18/24 1000  fluconazole  (DIFLUCAN ) IVPB 200 mg        200 mg 100 mL/hr over 60 Minutes Intravenous  Once 05/18/24  0830           Subjective: Patient sitting up in bed on her phone.  Denies any chest pain or shortness of breath.  States nausea and emesis has improved this morning.  Denies any abdominal pain.  No dysuria, no constipation, no diarrhea, no productive cough.  Overall feeling better than she did on admission.  Objective: Vitals:   05/18/24 0700 05/18/24 0800 05/18/24 0900 05/18/24 1000  BP:    113/65  Pulse:    92  Resp: 16 17 20  (!) 8  Temp:  98.9 F (37.2 C)    TempSrc:  Oral    SpO2:    100%  Weight:      Height:        Intake/Output Summary (Last 24 hours) at 05/18/2024 1030 Last data filed at 05/18/2024 0900 Gross per 24 hour  Intake 1306.88 ml  Output 2200 ml  Net -893.12 ml   Filed Weights   05/17/24 1030  Weight: 55.2 kg    Examination:  General exam: Appears calm and comfortable  Respiratory system: Clear to auscultation. Respiratory effort normal. Cardiovascular system: S1 & S2 heard, RRR. No JVD, murmurs, rubs, gallops or clicks. No pedal edema. Gastrointestinal system: Abdomen is nondistended, soft and nontender. No organomegaly or masses felt. Normal bowel sounds heard. Central nervous system: Alert and oriented. No focal neurological deficits. Extremities: Symmetric 5 x 5 power. Skin: No rashes, lesions or ulcers Psychiatry: Judgement and insight appear normal. Mood & affect appropriate.     Data Reviewed: I have personally reviewed following labs and imaging studies  CBC: Recent Labs  Lab 05/16/24 1646 05/16/24 1653 05/17/24 1108 05/18/24 0531  WBC 20.8*  --  16.4* 16.5*  NEUTROABS  --   --  12.9*  --   HGB 13.6 14.6 11.9* 12.2  HCT 39.3 43.0 35.5* 35.6*  MCV 84.3  --  86.8 86.6  PLT 457*  --  375 367    Basic Metabolic Panel: Recent Labs  Lab 05/16/24 1646 05/16/24 1653 05/17/24 0917 05/17/24 1108 05/18/24 0531  NA 136 133*  --  133* 130*  K 2.9* 3.1*  --  3.1* 3.7  CL 91*  --   --  96* 93*  CO2 26  --   --  22 14*  GLUCOSE  219*  --   --  247* 454*  BUN 19  --   --  14 11  CREATININE 1.03*  --   --  0.87 0.86  CALCIUM  11.2*  --   --  8.8* 8.9  MG  --   --  1.7 1.9  --     GFR: Estimated Creatinine Clearance: 86.4 mL/min (by C-G formula based on SCr of 0.86 mg/dL).  Liver Function Tests: Recent Labs  Lab 05/16/24 1646 05/17/24 1108 05/18/24 0531  AST 29 31 20   ALT 31 30 25   ALKPHOS 110 96 92  BILITOT 1.0 0.8 1.0  PROT 8.5* 7.0 6.6  ALBUMIN 5.1* 4.2 4.0    CBG: Recent Labs  Lab 05/17/24  1152 05/17/24 1732 05/17/24 2158 05/18/24 0742 05/18/24 0955  GLUCAP 202* 174* 123* 426* 302*     No results found for this or any previous visit (from the past 240 hours).       Radiology Studies: US  EKG SITE RITE Result Date: 05/18/2024 If Site Rite image not attached, placement could not be confirmed due to current cardiac rhythm.  CT ABDOMEN PELVIS W CONTRAST Result Date: 05/16/2024 CLINICAL DATA:  Acute abdominal pain. EXAM: CT ABDOMEN AND PELVIS WITH CONTRAST TECHNIQUE: Multidetector CT imaging of the abdomen and pelvis was performed using the standard protocol following bolus administration of intravenous contrast. RADIATION DOSE REDUCTION: This exam was performed according to the departmental dose-optimization program which includes automated exposure control, adjustment of the mA and/or kV according to patient size and/or use of iterative reconstruction technique. CONTRAST:  80mL OMNIPAQUE  IOHEXOL  300 MG/ML SOLN, 80mL OMNIPAQUE  IOHEXOL  300 MG/ML SOLN COMPARISON:  CT 01/25/2024 FINDINGS: Lower chest: The lung bases are clear. Hepatobiliary: No focal liver abnormality is seen. Slightly diminished hepatomegaly, liver spanning 18.9 cm, previously 20.2 cm. No gallstones, gallbladder wall thickening, or biliary dilatation. Pancreas: Atrophic.  No ductal dilatation or inflammation. Spleen: Normal in size without focal abnormality. Adrenals/Urinary Tract: No adrenal nodule. There is excreted IV contrast in  the renal collecting systems which partially obscures detailed assessment. No hydronephrosis or evidence of renal flow may shin. Excreted IV contrast in the urinary bladder. Stomach/Bowel: Detailed bowel assessment is limited in the absence of enteric contrast and paucity of intra-abdominal fat. Mild wall thickening of the distal esophagus. No bowel obstruction or inflammatory change. The appendix is not confidently visualized on the current exam, no evidence of appendicitis. Small to moderate colonic stool burden. Vascular/Lymphatic: No significant vascular findings are present. No enlarged abdominal or pelvic lymph nodes. Reproductive: IUD in the uterus. The IUD again appears to be malpositioned and rotated. No adnexal mass. Other: No free air, free fluid, or intra-abdominal fluid collection. Musculoskeletal: There are no acute or suspicious osseous abnormalities. IMPRESSION: 1. Mild wall thickening of the distal esophagus, can be seen with reflux or esophagitis. 2. Mild positioned IUD, although unchanged from prior exam. Electronically Signed   By: Andrea Gasman M.D.   On: 05/16/2024 19:29        Scheduled Meds:  Chlorhexidine  Gluconate Cloth  6 each Topical Daily   enoxaparin  (LOVENOX ) injection  40 mg Subcutaneous Q24H   [START ON 05/19/2024] fluconazole   200 mg Oral Daily   [START ON 05/19/2024] influenza vac split trivalent PF  0.5 mL Intramuscular Tomorrow-1000   pantoprazole  (PROTONIX ) IV  40 mg Intravenous Q12H   Continuous Infusions:  dextrose  5% lactated ringers      fluconazole  (DIFLUCAN ) IV     insulin  8 Units/hr (05/18/24 1004)   lactated ringers        LOS: 0 days    Time spent: 40 minutes    Toribio Hummer, MD [facility]   To contact the attending provider between 7A-7P or the covering provider during after hours 7P-7A, please log into the web site [email or web address] and access using universal [place] password for that web site. If you do not have the  password, please call the hospital operator.  05/18/2024, 10:30 AM

## 2024-05-19 DIAGNOSIS — E1010 Type 1 diabetes mellitus with ketoacidosis without coma: Secondary | ICD-10-CM | POA: Diagnosis not present

## 2024-05-19 DIAGNOSIS — R112 Nausea with vomiting, unspecified: Secondary | ICD-10-CM | POA: Diagnosis not present

## 2024-05-19 DIAGNOSIS — D72829 Elevated white blood cell count, unspecified: Secondary | ICD-10-CM | POA: Diagnosis not present

## 2024-05-19 DIAGNOSIS — K209 Esophagitis, unspecified without bleeding: Secondary | ICD-10-CM | POA: Diagnosis present

## 2024-05-19 DIAGNOSIS — E876 Hypokalemia: Secondary | ICD-10-CM | POA: Diagnosis not present

## 2024-05-19 LAB — GLUCOSE, CAPILLARY
Glucose-Capillary: 136 mg/dL — ABNORMAL HIGH (ref 70–99)
Glucose-Capillary: 145 mg/dL — ABNORMAL HIGH (ref 70–99)
Glucose-Capillary: 145 mg/dL — ABNORMAL HIGH (ref 70–99)
Glucose-Capillary: 147 mg/dL — ABNORMAL HIGH (ref 70–99)
Glucose-Capillary: 154 mg/dL — ABNORMAL HIGH (ref 70–99)
Glucose-Capillary: 161 mg/dL — ABNORMAL HIGH (ref 70–99)
Glucose-Capillary: 161 mg/dL — ABNORMAL HIGH (ref 70–99)
Glucose-Capillary: 171 mg/dL — ABNORMAL HIGH (ref 70–99)
Glucose-Capillary: 176 mg/dL — ABNORMAL HIGH (ref 70–99)
Glucose-Capillary: 192 mg/dL — ABNORMAL HIGH (ref 70–99)
Glucose-Capillary: 263 mg/dL — ABNORMAL HIGH (ref 70–99)
Glucose-Capillary: 267 mg/dL — ABNORMAL HIGH (ref 70–99)

## 2024-05-19 LAB — BASIC METABOLIC PANEL WITH GFR
Anion gap: 9 (ref 5–15)
Anion gap: 9 (ref 5–15)
BUN: 6 mg/dL (ref 6–20)
BUN: <5 mg/dL — ABNORMAL LOW (ref 6–20)
CO2: 23 mmol/L (ref 22–32)
CO2: 24 mmol/L (ref 22–32)
Calcium: 8.2 mg/dL — ABNORMAL LOW (ref 8.9–10.3)
Calcium: 8.2 mg/dL — ABNORMAL LOW (ref 8.9–10.3)
Chloride: 102 mmol/L (ref 98–111)
Chloride: 103 mmol/L (ref 98–111)
Creatinine, Ser: 0.68 mg/dL (ref 0.44–1.00)
Creatinine, Ser: 0.7 mg/dL (ref 0.44–1.00)
GFR, Estimated: >60 mL/min (ref >60)
GFR, Estimated: >60 mL/min (ref >60)
Glucose, Bld: 150 mg/dL — ABNORMAL HIGH (ref 70–99)
Glucose, Bld: 168 mg/dL — ABNORMAL HIGH (ref 70–99)
Potassium: 3.3 mmol/L — ABNORMAL LOW (ref 3.5–5.1)
Potassium: 3.5 mmol/L (ref 3.5–5.1)
Sodium: 135 mmol/L (ref 135–145)
Sodium: 136 mmol/L (ref 135–145)

## 2024-05-19 LAB — CBC WITH DIFFERENTIAL/PLATELET
Abs Immature Granulocytes: 0.03 K/uL (ref 0.00–0.07)
Basophils Absolute: 0 K/uL (ref 0.0–0.1)
Basophils Relative: 0 %
Eosinophils Absolute: 0 K/uL (ref 0.0–0.5)
Eosinophils Relative: 0 %
HCT: 30.1 % — ABNORMAL LOW (ref 36.0–46.0)
Hemoglobin: 10.1 g/dL — ABNORMAL LOW (ref 12.0–15.0)
Immature Granulocytes: 0 %
Lymphocytes Relative: 32 %
Lymphs Abs: 3.2 K/uL (ref 0.7–4.0)
MCH: 29.1 pg (ref 26.0–34.0)
MCHC: 33.6 g/dL (ref 30.0–36.0)
MCV: 86.7 fL (ref 80.0–100.0)
Monocytes Absolute: 0.9 K/uL (ref 0.1–1.0)
Monocytes Relative: 9 %
Neutro Abs: 5.9 K/uL (ref 1.7–7.7)
Neutrophils Relative %: 59 %
Platelets: 266 K/uL (ref 150–400)
RBC: 3.47 MIL/uL — ABNORMAL LOW (ref 3.87–5.11)
RDW: 14.3 % (ref 11.5–15.5)
WBC: 10 K/uL (ref 4.0–10.5)
nRBC: 0 % (ref 0.0–0.2)

## 2024-05-19 LAB — VITAMIN B12: Vitamin B-12: 358 pg/mL (ref 180–914)

## 2024-05-19 LAB — PHOSPHORUS: Phosphorus: 2.6 mg/dL (ref 2.5–4.6)

## 2024-05-19 LAB — URINE CULTURE

## 2024-05-19 LAB — IRON AND TIBC
Iron: 62 ug/dL (ref 28–170)
Saturation Ratios: 26 % (ref 10.4–31.8)
TIBC: 239 ug/dL — ABNORMAL LOW (ref 250–450)
UIBC: 177 ug/dL

## 2024-05-19 LAB — BETA-HYDROXYBUTYRIC ACID
Beta-Hydroxybutyric Acid: 0.56 mmol/L — ABNORMAL HIGH (ref 0.05–0.27)
Beta-Hydroxybutyric Acid: 0.46 mmol/L — ABNORMAL HIGH (ref 0.05–0.27)

## 2024-05-19 LAB — FERRITIN: Ferritin: 141 ng/mL (ref 11–307)

## 2024-05-19 LAB — FOLATE: Folate: 7.9 ng/mL (ref >5.9)

## 2024-05-19 LAB — MAGNESIUM: Magnesium: 2.2 mg/dL (ref 1.7–2.4)

## 2024-05-19 MED ORDER — INSULIN GLARGINE-YFGN 100 UNIT/ML ~~LOC~~ SOLN
10.0000 [IU] | Freq: Every day | SUBCUTANEOUS | Status: DC
  Filled 2024-05-19: qty 0.1

## 2024-05-19 MED ORDER — POTASSIUM CHLORIDE CRYS ER 20 MEQ PO TBCR
20.0000 meq | EXTENDED_RELEASE_TABLET | Freq: Once | ORAL | Status: AC
  Administered 2024-05-19: 20 meq via ORAL
  Filled 2024-05-19: qty 1

## 2024-05-19 MED ORDER — INSULIN ASPART 100 UNIT/ML IJ SOLN: 0.0000 [IU] | Freq: Every day | INTRAMUSCULAR | Status: DC

## 2024-05-19 MED ORDER — INSULIN GLARGINE-YFGN 100 UNIT/ML ~~LOC~~ SOLN
10.0000 [IU] | Freq: Once | SUBCUTANEOUS | Status: AC
  Administered 2024-05-19: 10 [IU] via SUBCUTANEOUS
  Filled 2024-05-19: qty 0.1

## 2024-05-19 MED ORDER — SODIUM CHLORIDE 0.9 % IV SOLN: INTRAVENOUS | Status: AC

## 2024-05-19 MED ORDER — INSULIN ASPART 100 UNIT/ML IJ SOLN
0.0000 [IU] | Freq: Three times a day (TID) | INTRAMUSCULAR | Status: DC
  Administered 2024-05-19: 5 [IU] via SUBCUTANEOUS
  Administered 2024-05-19 – 2024-05-20 (×3): 2 [IU] via SUBCUTANEOUS
  Administered 2024-05-20: 7 [IU] via SUBCUTANEOUS

## 2024-05-19 MED ORDER — INSULIN GLARGINE-YFGN 100 UNIT/ML ~~LOC~~ SOPN
10.0000 [IU] | PEN_INJECTOR | SUBCUTANEOUS | Status: DC
Start: 2024-05-20 — End: 2024-05-19

## 2024-05-19 NOTE — Inpatient Diabetes Management (Addendum)
 Inpatient Diabetes Program Recommendations  AACE/ADA: New Consensus Statement on Inpatient Glycemic Control (2015)  Target Ranges:  Prepandial:   less than 140 mg/dL      Peak postprandial:   less than 180 mg/dL (1-2 hours)      Critically ill patients:  140 - 180 mg/dL    Latest Reference Range & Units 05/17/24 11:59  Hemoglobin A1C 4.8 - 5.6 % 9.7 (H)  231 mg/dl  (H): Data is abnormally high  Latest Reference Range & Units 05/18/24 20:54 05/18/24 21:53 05/18/24 22:52 05/19/24 00:08 05/19/24 01:11 05/19/24 02:08 05/19/24 03:10 05/19/24 05:13 05/19/24 06:17  Glucose-Capillary 70 - 99 mg/dL 819 (H)  IV Insulin  Drip Running 184 (H) 182 (H) 171 (H) 161 (H) 145 (H) 147 (H) 176 (H) 136 (H)  10 units Semglee  @ 0626  (H): Data is abnormally high   Admit with: Intractable nausea and vomiting/ DKA  History: Type 1 Diabetes  Home DM Meds: Lantus  5 units BID     Novolog  4-10 units TID with meals   Current Orders: IV Insulin  Drip      Transitioning to Novolog  Sensitive Correction Scale/ SSI (0-9 units) TID AC + HS + Semglee  (one time dose given)   MD- Note pt transitioning to SQ Insulin  this AM One time dose Semglee  10 units given at 6:26am Needs Basal Insulin  ordered daily  Please add Semglee  10 units daily (start AM 10/28)  When able to tolerate PO, will likely need Novolog  Meal Coverage as well    Patient was recently inpatient 04/05/24-04/07/24 and inpatient diabetes coordinator spoke with patient on 04/06/24 during that admission.   PCP?? ENDO--Got dismissed from [facility] due to No Show for appts   Addendum 12pm--Met w/ pt at bedside in the ICU.  Pt A&O and able to have meaningful conversation.  Pt told me she uses Lantus  5 units BID (vials) and Novolog  4-10 units TID (pens).  Has been using her insulin  and unsure why she went into DKA?  Has insulin  at home--Has pen needles, syringes.  Would like to be switched to Lantus  insulin  pen (pt unsure why the MD prescribed the  Lantus  in vial form).  Confirmed she needs new ENDO as was let go from previous ENDO--She lives in [place] and had a hard time getting to appts as she does not drive.  States she is trying to get appt here in [place] off [address]--Doesn't know the name of the MD--Looks like possibly [facility]?  Pt told me her CBG meter needs new batteries and she hasn't been able to check her CBGs--Does not have transportation to go to the store but stated to me she will try to get a ride or have family bring her batteries.  She told me she has used both the Dexcom G6 and the Dexcom G7 CGM before (uses the app on her phone).  I was able to give her 1 free sample Dexcom G7 and asked to start it as soon as she goes home.  I told pt I will ask the discharging MD to switch her to Lantus  insulin  pen and will also ask for refills for the Dexcom G7 CGM.  Also discussed with patient diagnosis of DKA (pathophysiology), treatment of DKA, lab results, and transition plan to SQ insulin  regimen.  Pt very appreciative of visit and did not have any questions for me at this time.       --Will follow patient during hospitalization--  Adina Rudolpho Arrow RN,  MSN, CDCES Diabetes Coordinator Inpatient Glycemic Control Team Team Pager: [phone] (8a-5p)

## 2024-05-19 NOTE — Plan of Care (Signed)
  Problem: Education: Goal: Knowledge of General Education information will improve Description: Including pain rating scale, medication(s)/side effects and non-pharmacologic comfort measures Outcome: Progressing   Problem: Clinical Measurements: Goal: Respiratory complications will improve Outcome: Progressing Goal: Cardiovascular complication will be avoided Outcome: Progressing   Problem: Elimination: Goal: Will not experience complications related to urinary retention Outcome: Progressing   Problem: Nutrition: Goal: Adequate nutrition will be maintained Outcome: Not Progressing   Problem: Pain Managment: Goal: General experience of comfort will improve and/or be controlled Outcome: Not Progressing

## 2024-05-19 NOTE — Progress Notes (Signed)
 Chaplain visited pt briefly to follow up from visit earlier today with Chaplain Amy. This chaplain provided a pillow to pt and encouragement to pt. Chaplain team remains available to provide additional support. If urgent need arises, please page Chaplain Team [phone].   Chaplain [place], [place]. Div.    05/19/24 1400  Spiritual Encounters  Type of Visit Follow up  Care provided to: Patient

## 2024-05-19 NOTE — Progress Notes (Signed)
   05/19/24 1200  Spiritual Encounters  Type of Visit Initial  Care provided to: Patient  Referral source Chaplain assessment  Reason for visit Routine spiritual support   I visited briefly with Ms Sabrina Sutton. While rounding on unit I noticed her big smile and shared that her smile was beuatiful and that I hoped she was feeling better today. I asked Chaplain Abby to follow up to offer a dogbone pillow as Ms Sabrina Sutton appeared she might benefit from that.  Will continue to follow. Jola Critzer L. Delores HERO.Div

## 2024-05-19 NOTE — Progress Notes (Addendum)
 PROGRESS NOTE    Sabrina Sutton  FMW:[id number] DOB: 07-Feb-1996 DOA: 05/16/2024 PCP: Patient, No Pcp Per    Chief Complaint  Patient presents with   Abdominal Pain   Emesis    Brief Narrative:  Patient is a 28 year old female history of type 1 diabetes, depression, ongoing marijuana use and tobacco use presenting with abdominal pain, nausea and vomiting.  CT abdomen and pelvis done concerning for esophagitis.  EKG with normal sinus rhythm.  UDS positive for opiates and marijuana.  Patient noted to be hypokalemic on admission, bicarb of 26 initially and anion gap of 19 with a white count of 20.8.  Patient admitted for further evaluation and management.  Repeat labs obtained with concerns for DKA, patient with CBG in the 400s on 05/18/2024 and placed on the Endo tool and transferred to the stepdown unit.   Assessment & Plan:   Principal Problem:   DKA (diabetic ketoacidosis) (HCC) Active Problems:   Hypokalemia   Intractable nausea and vomiting   Leukocytosis   Tobacco abuse   Hypercalcemia   Hypomagnesemia   Esophagitis  #1 DKA in type I diabetic -Patient noted with hemoglobin A1c of 10.3 in 01/2024. - Repeat hemoglobin A1c of 9.7. - Patient presented with intractable nausea and vomiting. - Initial labs obtained on presentation with a bicarb of 26, anion gap of 19, glucose of 219 with a potassium of 2.9. - Beta hydroxybutyric acid noted at 2.58>>> 1.55. - Repeat labs obtained with a sodium of 130, bicarb of 14, glucose of 454, anion gap of 24. - Patient in DKA and as such was transferred to the stepdown unit and started on Endo tool/insulin  drip.  -Urinalysis nitrite negative leukocytes negative. -Chest x-ray negative for any acute infiltrate. -Patient afebrile. -Leukocytosis trended down. - Initial UA done with budding yeast noted to be present with trace leukocytes, nitrite negative, 11-20 WBCs. -Continue Diflucan . -Anion gap closed, acidosis resolved and patient  transitioned to subcutaneous insulin /Semglee  10 units daily this morning. -Continue SSI. -Continue hydration for another 24 hours. -Change CBGs to [facility] and at bedtime. -Diabetes coordinator following.  2.  Intractable nausea and vomiting/?  Esophagitis -Likely secondary to problem #1 versus cannabinoid hyperemesis. - Patient denies any diarrhea any prior history of gastroparesis - Clinical improvement. - CT abdomen and pelvis with concerns of esophagitis. - Continue IV fluids, IV PPI. -If oral intake improves with no further nausea and vomiting will transition to oral PPI. - Marijuana cessation stressed to patient. - Continue IV Reglan  as needed nausea and vomiting. - Zofran  for refractory nausea and vomiting. - Supportive care.  3.  Hypokalemia/hypomagnesemia/hypophosphatemia -Likely secondary to GI losses. -Potassium 3.3 this morning  - Magnesium  noted at 2.2, phosphorus at 2.6. -Replete potassium.  4.  Hypercalcemia -Secondary to dehydration. - Resolved with hydration.  5.  Leukocytosis -Felt likely secondary to demargination and hemoconcentration. - Patient afebrile. -Leukocytosis trended down -Urinalysis nitrite negative, leukocytes negative. -Urine cultures pending. -Chest x-ray no with no infiltrate noted. -Follow.  6 anemia -Likely dilutional. - Patient with no overt bleeding. - Check anemia panel. - Follow H&H. - Transfusion threshold hemoglobin < 7  6.  Marijuana and tobacco use -Patient noted to smoke 3 to 4 cigarettes a day and 1-2 joints of marijuana a day. - Cessation stressed to patient.    DVT prophylaxis: Lovenox  Code Status: Full Family Communication: Updated patient.  No family at bedside. Disposition: Likely home when clinically improved with resolution of DKA and nausea and vomiting.  Status is:  Inpatient    Consultants:  None  Procedures:  CT abdomen pelvis 05/16/2024 Chest x-ray 05/18/2024 PICC line placement  05/18/2024  Antimicrobials:  Anti-infectives (From admission, onward)    Start     Dose/Rate Route Frequency Ordered Stop   05/19/24 1000  fluconazole  (DIFLUCAN ) tablet 200 mg        200 mg Oral Daily 05/18/24 0830 05/25/24 0959   05/18/24 1000  fluconazole  (DIFLUCAN ) IVPB 200 mg        200 mg 100 mL/hr over 60 Minutes Intravenous  Once 05/18/24 0830 05/18/24 1335         Subjective: Patient sitting up in bed.  States she feels much better today.  Denies any chest pain or shortness of breath.  No abdominal pain.  No nausea or vomiting.  Boyfriend at bedside.    Objective: Vitals:   05/19/24 0600 05/19/24 0700 05/19/24 0730 05/19/24 0800  BP: 102/65   (!) 133/120  Pulse: 75 64  64  Resp: (!) 21 17  16   Temp:   98.2 F (36.8 C)   TempSrc:   Oral   SpO2: 100% 100%  100%  Weight:      Height:        Intake/Output Summary (Last 24 hours) at 05/19/2024 0923 Last data filed at 05/19/2024 0824 Gross per 24 hour  Intake 3582.78 ml  Output --  Net 3582.78 ml   Filed Weights   05/17/24 1030 05/18/24 1000  Weight: 55.2 kg 57.4 kg    Examination:  General exam: NAD. Respiratory system: Clear to auscultation bilaterally.  No wheezes, no crackles, no rhonchi.  Fair air movement.  Speaking in full sentences.  Cardiovascular system: RRR no murmurs rubs or gallops.  No JVD.  No lower extremity edema.  Gastrointestinal system: Abdomen is soft, nontender, nondistended, positive bowel sounds.  No rebound.  No guarding.  Central nervous system: Alert and oriented. No focal neurological deficits. Extremities: Symmetric 5 x 5 power. Skin: No rashes, lesions or ulcers Psychiatry: Judgement and insight appear normal. Mood & affect appropriate.     Data Reviewed: I have personally reviewed following labs and imaging studies  CBC: Recent Labs  Lab 05/16/24 1646 05/16/24 1653 05/17/24 1108 05/18/24 0531 05/19/24 0400  WBC 20.8*  --  16.4* 16.5* 10.0  NEUTROABS  --   --  12.9*   --  5.9  HGB 13.6 14.6 11.9* 12.2 10.1*  HCT 39.3 43.0 35.5* 35.6* 30.1*  MCV 84.3  --  86.8 86.6 86.7  PLT 457*  --  375 367 266    Basic Metabolic Panel: Recent Labs  Lab 05/17/24 0917 05/17/24 1108 05/18/24 0531 05/18/24 1134 05/18/24 1531 05/18/24 2029 05/19/24 0016 05/19/24 0400  NA  --  133*   < > 132* 131* 131* 135 136  K  --  3.1*   < > 3.3* 3.2* 3.4* 3.5 3.3*  CL  --  96*   < > 100 100 99 102 103  CO2  --  22   < > 16* 20* 23 23 24   GLUCOSE  --  247*   < > 162* 544* 198* 168* 150*  BUN  --  14   < > 9 8 6 6  <5*  CREATININE  --  0.87   < > 0.68 0.74 0.76 0.70 0.68  CALCIUM   --  8.8*   < > 8.2* 8.3* 8.3* 8.2* 8.2*  MG 1.7 1.9  --  1.5*  --   --   --  2.2  PHOS  --   --   --  1.5*  --   --   --  2.6   < > = values in this interval not displayed.    GFR: Estimated Creatinine Clearance: 96.6 mL/min (by C-G formula based on SCr of 0.68 mg/dL).  Liver Function Tests: Recent Labs  Lab 05/16/24 1646 05/17/24 1108 05/18/24 0531  AST 29 31 20   ALT 31 30 25   ALKPHOS 110 96 92  BILITOT 1.0 0.8 1.0  PROT 8.5* 7.0 6.6  ALBUMIN 5.1* 4.2 4.0    CBG: Recent Labs  Lab 05/19/24 0310 05/19/24 0513 05/19/24 0617 05/19/24 0723 05/19/24 0832  GLUCAP 147* 176* 136* 145* 161*     Recent Results (from the past 240 hours)  MRSA Next Gen by PCR, Nasal     Status: None   Collection Time: 05/18/24  9:51 AM   Specimen: Nasal Mucosa; Nasal Swab  Result Value Ref Range Status   MRSA by PCR Next Gen NOT DETECTED NOT DETECTED Final    Comment: (NOTE) The GeneXpert MRSA Assay (FDA approved for NASAL specimens only), is one component of a comprehensive MRSA colonization surveillance program. It is not intended to diagnose MRSA infection nor to guide or monitor treatment for MRSA infections. Test performance is not FDA approved in patients less than 73 years old. Performed at [facility], [address] [place], [place] [address]           Radiology Studies: DG CHEST PORT 1 VIEW Result Date: 05/18/2024 EXAM: 1 VIEW(S) XRAY OF THE CHEST 05/18/2024 12:07:00 PM COMPARISON: 01/26/2024 CLINICAL HISTORY: Nausea \\T \ vomiting [id number]. Nausea and vomiting. FINDINGS: LINES, TUBES AND DEVICES: Right PICC line terminates at superior cavoatrial junction. LUNGS AND PLEURA: No focal pulmonary opacity. No pulmonary edema. No pleural effusion. No pneumothorax. HEART AND MEDIASTINUM: No acute abnormality of the cardiac and mediastinal silhouettes. BONES AND SOFT TISSUES: No acute osseous abnormality. IMPRESSION: 1. No acute cardiopulmonary process identified. Electronically signed by: Selinda Blue MD 05/18/2024 02:09 PM EDT RP Workstation: [id number]   US  EKG SITE RITE Result Date: 05/18/2024 If Site Rite image not attached, placement could not be confirmed due to current cardiac rhythm.       Scheduled Meds:  Chlorhexidine  Gluconate Cloth  6 each Topical Daily   enoxaparin  (LOVENOX ) injection  40 mg Subcutaneous Q24H   fluconazole   200 mg Oral Daily   influenza vac split trivalent PF  0.5 mL Intramuscular Tomorrow-1000   insulin  aspart  0-5 Units Subcutaneous QHS   insulin  aspart  0-9 Units Subcutaneous TID WC   [START ON 05/20/2024] insulin  glargine-yfgn  10 Units Subcutaneous Daily   pantoprazole  (PROTONIX ) IV  40 mg Intravenous Q12H   sodium chloride  flush  10-40 mL Intracatheter Q12H   Continuous Infusions:  sodium chloride  125 mL/hr at 05/19/24 0859     LOS: 1 day    Time spent: 40 minutes    Toribio Hummer, MD [facility]   To contact the attending provider between 7A-7P or the covering provider during after hours 7P-7A, please log into the web site [email or web address] and access using universal Tea password for that web site. If you do not have the password, please call the hospital operator.  05/19/2024, 9:23 AM

## 2024-05-19 NOTE — Progress Notes (Signed)
       Overnight   NAME: Sabrina Sutton MRN: [id number] DOB : June 14, 1996    Date of Service   05/19/2024   HPI/Events of Note    Notified by RN for Lab values   CO2 and anion gap in range 3x and Bha below 0.50. CBGs in range - multiple times  Transition off insulin  drip in progress    Interventions/ Plan   Transition off of insulin  drip - 2 hours  Carb mod diet  AC-HS  CBG/SSI Labs to continue 05/20/24 AM       Lynwood Kipper BSN MSNA MSN ACNPC-AG Acute Care Nurse Practitioner [facility]

## 2024-05-20 ENCOUNTER — Other Ambulatory Visit (HOSPITAL_COMMUNITY): Payer: Self-pay

## 2024-05-20 DIAGNOSIS — E1065 Type 1 diabetes mellitus with hyperglycemia: Secondary | ICD-10-CM

## 2024-05-20 DIAGNOSIS — E1010 Type 1 diabetes mellitus with ketoacidosis without coma: Secondary | ICD-10-CM | POA: Diagnosis not present

## 2024-05-20 DIAGNOSIS — R112 Nausea with vomiting, unspecified: Secondary | ICD-10-CM | POA: Diagnosis not present

## 2024-05-20 DIAGNOSIS — K209 Esophagitis, unspecified without bleeding: Secondary | ICD-10-CM | POA: Diagnosis not present

## 2024-05-20 LAB — CBC WITH DIFFERENTIAL/PLATELET
Abs Immature Granulocytes: 0.02 K/uL (ref 0.00–0.07)
Basophils Absolute: 0 K/uL (ref 0.0–0.1)
Basophils Relative: 0 %
Eosinophils Absolute: 0.1 K/uL (ref 0.0–0.5)
Eosinophils Relative: 1 %
HCT: 27.9 % — ABNORMAL LOW (ref 36.0–46.0)
Hemoglobin: 8.9 g/dL — ABNORMAL LOW (ref 12.0–15.0)
Immature Granulocytes: 0 %
Lymphocytes Relative: 53 %
Lymphs Abs: 4 K/uL (ref 0.7–4.0)
MCH: 28.3 pg (ref 26.0–34.0)
MCHC: 31.9 g/dL (ref 30.0–36.0)
MCV: 88.6 fL (ref 80.0–100.0)
Monocytes Absolute: 0.7 K/uL (ref 0.1–1.0)
Monocytes Relative: 9 %
Neutro Abs: 2.8 K/uL (ref 1.7–7.7)
Neutrophils Relative %: 37 %
Platelets: 207 K/uL (ref 150–400)
RBC: 3.15 MIL/uL — ABNORMAL LOW (ref 3.87–5.11)
RDW: 14.6 % (ref 11.5–15.5)
Smear Review: NORMAL
WBC: 7.5 K/uL (ref 4.0–10.5)
nRBC: 0 % (ref 0.0–0.2)

## 2024-05-20 LAB — BASIC METABOLIC PANEL WITH GFR
Anion gap: 5 (ref 5–15)
BUN: <5 mg/dL — ABNORMAL LOW (ref 6–20)
CO2: 23 mmol/L (ref 22–32)
Calcium: 7.5 mg/dL — ABNORMAL LOW (ref 8.9–10.3)
Chloride: 109 mmol/L (ref 98–111)
Creatinine, Ser: 0.73 mg/dL (ref 0.44–1.00)
GFR, Estimated: >60 mL/min (ref >60)
Glucose, Bld: 285 mg/dL — ABNORMAL HIGH (ref 70–99)
Potassium: 3.7 mmol/L (ref 3.5–5.1)
Sodium: 137 mmol/L (ref 135–145)

## 2024-05-20 LAB — GLUCOSE, CAPILLARY
Glucose-Capillary: 333 mg/dL — ABNORMAL HIGH (ref 70–99)
Glucose-Capillary: 179 mg/dL — ABNORMAL HIGH (ref 70–99)

## 2024-05-20 LAB — MAGNESIUM: Magnesium: 1.8 mg/dL (ref 1.7–2.4)

## 2024-05-20 MED ORDER — PANTOPRAZOLE SODIUM 40 MG PO TBEC
40.0000 mg | DELAYED_RELEASE_TABLET | Freq: Every day | ORAL | 0 refills | Status: DC
  Filled 2024-05-20: qty 90, 90d supply, fill #0

## 2024-05-20 MED ORDER — INSULIN GLARGINE-YFGN 100 UNIT/ML ~~LOC~~ SOLN
12.0000 [IU] | Freq: Every day | SUBCUTANEOUS | Status: DC
  Administered 2024-05-20: 12 [IU] via SUBCUTANEOUS
  Filled 2024-05-20: qty 0.12

## 2024-05-20 MED ORDER — DEXCOM G7 SENSOR MISC
12.0000 [IU] | Freq: Every day | 3 refills | Status: DC
  Filled 2024-05-20: qty 1, 15d supply, fill #0

## 2024-05-20 MED ORDER — BLOOD GLUCOSE MONITOR SYSTEM W/DEVICE KIT
1.0000 | PACK | 0 refills | Status: AC
  Filled 2024-05-20: qty 1, 30d supply, fill #0

## 2024-05-20 MED ORDER — NOVOLOG FLEXPEN 100 UNIT/ML ~~LOC~~ SOPN
4.0000 [IU] | PEN_INJECTOR | Freq: Three times a day (TID) | SUBCUTANEOUS | 0 refills | Status: DC
  Filled 2024-05-20: qty 15, 90d supply, fill #0

## 2024-05-20 MED ORDER — PEN NEEDLES 31G X 5 MM MISC
1.0000 | 0 refills | Status: DC
  Filled 2024-05-20: qty 100, 25d supply, fill #0

## 2024-05-20 MED ORDER — INSULIN GLARGINE 100 UNIT/ML SOLOSTAR PEN
12.0000 [IU] | PEN_INJECTOR | Freq: Every day | SUBCUTANEOUS | 0 refills | Status: DC
  Filled 2024-05-20: qty 9, 75d supply, fill #0

## 2024-05-20 MED ORDER — FLUCONAZOLE 200 MG PO TABS
200.0000 mg | ORAL_TABLET | Freq: Every day | ORAL | 0 refills | Status: AC
  Filled 2024-05-20: qty 5, 5d supply, fill #0

## 2024-05-20 MED ORDER — METOCLOPRAMIDE HCL 5 MG PO TABS
10.0000 mg | ORAL_TABLET | Freq: Three times a day (TID) | ORAL | 0 refills | Status: DC | PRN
  Filled 2024-05-20: qty 30, 5d supply, fill #0

## 2024-05-20 MED ORDER — LANCET DEVICE MISC
1.0000 | 0 refills | Status: AC
  Filled 2024-05-20: qty 1, fill #0

## 2024-05-20 MED ORDER — CALCIUM GLUCONATE-NACL 1-0.675 GM/50ML-% IV SOLN
1.0000 g | Freq: Once | INTRAVENOUS | Status: AC
  Administered 2024-05-20: 1000 mg via INTRAVENOUS
  Filled 2024-05-20: qty 50

## 2024-05-20 MED ORDER — ACETAMINOPHEN 325 MG PO TABS: 650.0000 mg | ORAL_TABLET | Freq: Four times a day (QID) | ORAL | Status: AC | PRN

## 2024-05-20 MED ORDER — BLOOD GLUCOSE TEST VI STRP
1.0000 | ORAL_STRIP | 0 refills | Status: DC
  Filled 2024-05-20: qty 100, 25d supply, fill #0

## 2024-05-20 MED ORDER — LANCETS MISC
1.0000 | 0 refills | Status: DC
  Filled 2024-05-20: qty 100, 25d supply, fill #0

## 2024-05-20 NOTE — Progress Notes (Signed)
 Discharge meds in a secure bag delivered to patient in room by this RN. Too soon for Novolog  fill, patient confirmed she had med at home

## 2024-05-20 NOTE — Inpatient Diabetes Management (Signed)
 Inpatient Diabetes Program Recommendations  AACE/ADA: New Consensus Statement on Inpatient Glycemic Control (2015)  Target Ranges:  Prepandial:   less than 140 mg/dL      Peak postprandial:   less than 180 mg/dL (1-2 hours)      Critically ill patients:  140 - 180 mg/dL    Latest Reference Range & Units 05/19/24 03:10 05/19/24 05:13 05/19/24 06:17 05/19/24 07:23 05/19/24 08:32 05/19/24 11:45 05/19/24 16:45 05/19/24 21:30  Glucose-Capillary 70 - 99 mg/dL 852 (H) 823 (H)  IV Insulin  Drip Running 136 (H)  10 units Semglee  @0626  145 (H)  IV Insulin  Drip Stopped 161 (H)  2 units Novolog   154 (H)  2 units Novolog   263 (H)  5 units Novolog   192 (H)  (H): Data is abnormally high  Latest Reference Range & Units 05/20/24 07:22  Glucose-Capillary 70 - 99 mg/dL 666 (H)  (H): Data is abnormally high   History: Type 1 Diabetes   Home DM Meds: Lantus  5 units BID     Novolog  4-10 units TID with meals    Current Orders: Novolog  Sensitive Correction Scale/ SSI (0-9 units) TID AC + HS     Semglee  12 units daily   Note Semglee  increased to 12 units daily this AM Plan to d/c home today    --Will follow patient during hospitalization--  Adina Rudolpho Arrow RN, MSN, CDCES Diabetes Coordinator Inpatient Glycemic Control Team Team Pager: [phone] (8a-5p)     --Will follow patient during hospitalization--  Adina Rudolpho Arrow RN, MSN, CDCES Diabetes Coordinator Inpatient Glycemic Control Team Team Pager: [phone] (8a-5p)

## 2024-05-20 NOTE — Progress Notes (Incomplete)
 Discharged instructions discussed with patient. PICC removed by PICC nurse and flat time completed at 12 pm.    Patient confirmed all belongings. Wheeled to front entrance.

## 2024-05-20 NOTE — Discharge Summary (Signed)
 Physician Discharge Summary  Sabrina Sutton FMW:[id number] DOB: Jul 03, 1996 DOA: 05/16/2024  PCP: Patient, No Pcp Per  Admit date: 05/16/2024 Discharge date: 05/20/2024  Time spent: 60 minutes  Recommendations for Outpatient Follow-up:  Follow-up with Patient, No Pcp Per;/PCP in 2 weeks.  On follow-up patient's diabetes will need to be reassessed.  Patient will need a basic metabolic profile, phosphorus level, magnesium  level done to follow-up on electrolytes and renal function.  Patient need a CBC done to follow-up on counts.  May need a vitamin D level checked on follow-up.   Discharge Diagnoses:  Principal Problem:   DKA (diabetic ketoacidosis) (HCC) Active Problems:   Hypokalemia   Intractable nausea and vomiting   Leukocytosis   Tobacco abuse   Hypercalcemia   Hypomagnesemia   Esophagitis   Discharge Condition: Stable and improved.  Diet recommendation: Carb modified diet  Filed Weights   05/17/24 1030 05/18/24 1000  Weight: 55.2 kg 57.4 kg    History of present illness:  HPI per Dr. Gonfa Jadesola Llorente PMH of DM-1, depression, marijuana use and tobacco use disorder presenting with abdominal pain and vomiting.   Patient reports abdominal pain and vomiting for about a day before she presented to [facility]. Describes the pain as feeling sick in stomach.  Reports countless emesis before she presented to ED.  She has had 4 episode of emesis since arrival to ED.  Last emesis was about 4 hours ago.  Emesis was nonbloody.  She denies fever, runny nose, congestion, chest pain, shortness of breath, cough or UTI symptoms.  Denies reflux or heartburn.  She reports sore throat that she attributes to vomiting.  Reports compliance with her insulin .  She reports using Lantus  5 units twice daily and 10 units of short acting with meals.    Reports smoking 3 to 4 cigarettes a day.  Reports smoking 1-2 marijuana a day.  Denies alcohol  or other recreational drug use   In ED,  stable vitals.  pH 7.57.  K2.9.  Glucose 219. Cr 1.03.  Calcium  11.2.  Bicarb 26.  AG 19.  WBC 20.8.  DHEA 2.58.  Regnancy test negative.  CT abdomen and pelvis suggested esophagitis.  EKG sinus rhythm.  UDS positive for opiate and marijuana.  UA ordered.  Received droperidol , NovoLog  12 units, IV morphine  4 mg x 2, Zofran , 10 mL equivalent of IV KCl and 2 L of NS bolus 2 L.  Admission accepted by my colleague from overnight for intractable nausea and vomiting.   Hospital Course:  #1 DKA in type I diabetic -Patient noted with hemoglobin A1c of 10.3 in 01/2024. - Repeat hemoglobin A1c of 9.7. - Patient presented with intractable nausea and vomiting. - Initial labs obtained on presentation with a bicarb of 26, anion gap of 19, glucose of 219 with a potassium of 2.9. - Beta hydroxybutyric acid noted at 2.58>>> 1.55. - Repeat labs obtained with a sodium of 130, bicarb of 14, glucose of 454, anion gap of 24. - Patient was noted to be in DKA and as such was transferred to the stepdown unit and started on Endo tool/insulin  drip.  -Urinalysis nitrite negative leukocytes negative. -Chest x-ray negative for any acute infiltrate. -Patient afebrile. -Leukocytosis trended down. - Initial UA done with budding yeast noted to be present with trace leukocytes, nitrite negative, 11-20 WBCs. - Patient placed on Diflucan  and be discharged on 5 more days of Diflucan  to complete a course of treatment.  -Anion gap closed, acidosis resolved and patient transitioned  to subcutaneous insulin /Semglee  10 units daily.  - Patient maintained on sliding scale insulin .  - Long-acting insulin  was uptitrated to 12 units daily which patient be discharged home on.  - Patient seen and followed by diabetic coordinator during the hospitalization.  - Patient will be discharged home in stable and improved condition.    2.  Intractable nausea and vomiting/?  Esophagitis -Likely secondary to problem #1 versus cannabinoid hyperemesis. -  Patient denied any diarrhea any prior history of gastroparesis - CT abdomen and pelvis with concerns of esophagitis. - Patient maintained on IV PPI and IV fluids.   - Patient improved clinically with correction of DKA with no further nausea or vomiting tolerating oral intake.   - Patient be discharged on Protonix  40 mg daily. - Marijuana cessation stressed to patient. - Patient also maintained on IV Reglan  as needed during the hospitalization and Zofran  for refractory nausea vomiting. - Patient will be discharged in stable and improved condition.    3.  Hypokalemia/hypomagnesemia/hypophosphatemia -Likely secondary to GI losses. - Repleted during the hospitalization.  - Outpatient follow-up.   4.  Hypercalcemia/hypocalcemia -Secondary to dehydration. - Resolved with hydration. -Hypercalcemia improved and calcium  noted at 7.5 on day of discharge. -Patient received a gram of calcium  gluconate. -Will need outpatient follow-up with PCP.   5.  Leukocytosis -Felt likely secondary to demargination and hemoconcentration. - Patient afebrile. -Leukocytosis trended down -Urinalysis nitrite negative, leukocytes negative. -Urine cultures with multiple species. -Chest x-ray no with no infiltrate noted. -Leukocytosis had resolved by day of discharge.   6 anemia -Likely dilutional. - Patient with no overt bleeding. - Anemia panel with iron level of 62 noted, TIBC of 239, folate of 7.9, vitamin B12 of 358.   - Outpatient follow-up with PCP.   6.  Marijuana and tobacco use -Patient noted to smoke 3 to 4 cigarettes a day and 1-2 joints of marijuana a day. - Cessation stressed to patient.      Procedures: CT abdomen pelvis 05/16/2024 Chest x-ray 05/18/2024 PICC line placement 05/18/2024    Consultations: None  Discharge Exam: Vitals:   05/20/24 0723 05/20/24 0800  BP:  (!) 152/100  Pulse:  (!) 54  Resp:  15  Temp: 98.4 F (36.9 C)   SpO2:  100%    General:  NAD Cardiovascular: RRR no murmurs rubs or gallops.  No JVD.  No lower extremity edema. Respiratory: Clear to auscultation bilaterally.  No wheezes, no crackles, no rhonchi.  Fair air movement.  Speaking in full sentences.  Discharge Instructions   Discharge Instructions     Diet Carb Modified   Complete by: As directed    Increase activity slowly   Complete by: As directed       Allergies as of 05/20/2024   No Known Allergies      Medication List     STOP taking these medications    insulin  glargine 100 UNIT/ML injection Commonly known as: LANTUS  Replaced by: insulin  glargine 100 UNIT/ML Solostar Pen       TAKE these medications    acetaminophen  325 MG tablet Commonly known as: TYLENOL  Take 2 tablets (650 mg total) by mouth every 6 (six) hours as needed for mild pain (pain score 1-3) or fever (or Fever >/= 101).   Blood Glucose Monitoring Suppl Devi 1 each by Does not apply route as directed. Dispense based on patient and insurance preference. Use up to four times daily as directed. (FOR ICD-10 E10.9, E11.9).   Dexcom G7 Sensor Misc  12 Units by Does not apply route daily.   fluconazole  200 MG tablet Commonly known as: DIFLUCAN  Take 1 tablet (200 mg total) by mouth daily for 5 days.   glucose blood test strip Use as instructed What changed: Another medication with the same name was added. Make sure you understand how and when to take each.   BLOOD GLUCOSE TEST STRIPS Strp 1 each by Does not apply route as directed. Dispense based on patient and insurance preference. Use up to four times daily as directed. (FOR ICD-10 E10.9, E11.9). What changed: You were already taking a medication with the same name, and this prescription was added. Make sure you understand how and when to take each.   insulin  glargine 100 UNIT/ML Solostar Pen Commonly known as: LANTUS  Inject 12 Units into the skin daily. May substitute as needed per insurance. Replaces: insulin  glargine  100 UNIT/ML injection   Lancet Device Misc 1 each by Does not apply route as directed. Dispense based on patient and insurance preference. Use up to four times daily as directed. (FOR ICD-10 E10.9, E11.9).   Lancets Misc 1 each by Does not apply route as directed. Dispense based on patient and insurance preference. Use up to four times daily as directed. (FOR ICD-10 E10.9, E11.9).   metoCLOPramide  5 MG tablet Commonly known as: Reglan  Take 2 tablets (10 mg total) by mouth every 8 (eight) hours as needed for nausea.   NovoLOG  FlexPen 100 UNIT/ML FlexPen Generic drug: insulin  aspart Inject 4 Units into the skin 3 (three) times daily with meals. What changed: See the new instructions.   ondansetron  4 MG disintegrating tablet Commonly known as: ZOFRAN -ODT Take 1 tablet (4 mg total) by mouth every 8 (eight) hours as needed for nausea or vomiting.   pantoprazole  40 MG tablet Commonly known as: Protonix  Take 1 tablet (40 mg total) by mouth daily.   TechLite Pen Needles 32G X 4 MM Misc Generic drug: Insulin  Pen Needle Use to inject insulin  daily What changed: Another medication with the same name was added. Make sure you understand how and when to take each.   Pen Needles 31G X 5 MM Misc 1 each by Does not apply route as directed. Dispense based on patient and insurance preference. Use up to four times daily as directed. (FOR ICD-10 E10.9, E11.9). What changed: You were already taking a medication with the same name, and this prescription was added. Make sure you understand how and when to take each.       No Known Allergies  Follow-up Information     Anyanwu, Ugonna A, MD. Schedule an appointment as soon as possible for a visit in 2 week(s).   Specialty: Obstetrics and Gynecology Contact information: [address] [place] [address] [phone]                  The results of significant diagnostics from this hospitalization (including imaging, microbiology,  ancillary and laboratory) are listed below for reference.    Significant Diagnostic Studies: DG CHEST PORT 1 VIEW Result Date: 05/18/2024 EXAM: 1 VIEW(S) XRAY OF THE CHEST 05/18/2024 12:07:00 PM COMPARISON: 01/26/2024 CLINICAL HISTORY: Nausea \\T \ vomiting [id number]. Nausea and vomiting. FINDINGS: LINES, TUBES AND DEVICES: Right PICC line terminates at superior cavoatrial junction. LUNGS AND PLEURA: No focal pulmonary opacity. No pulmonary edema. No pleural effusion. No pneumothorax. HEART AND MEDIASTINUM: No acute abnormality of the cardiac and mediastinal silhouettes. BONES AND SOFT TISSUES: No acute osseous abnormality. IMPRESSION: 1. No acute cardiopulmonary process identified.  Electronically signed by: Selinda Blue MD 05/18/2024 02:09 PM EDT RP Workstation: [id number]   US  EKG SITE RITE Result Date: 05/18/2024 If Site Rite image not attached, placement could not be confirmed due to current cardiac rhythm.  CT ABDOMEN PELVIS W CONTRAST Result Date: 05/16/2024 CLINICAL DATA:  Acute abdominal pain. EXAM: CT ABDOMEN AND PELVIS WITH CONTRAST TECHNIQUE: Multidetector CT imaging of the abdomen and pelvis was performed using the standard protocol following bolus administration of intravenous contrast. RADIATION DOSE REDUCTION: This exam was performed according to the departmental dose-optimization program which includes automated exposure control, adjustment of the mA and/or kV according to patient size and/or use of iterative reconstruction technique. CONTRAST:  80mL OMNIPAQUE  IOHEXOL  300 MG/ML SOLN, 80mL OMNIPAQUE  IOHEXOL  300 MG/ML SOLN COMPARISON:  CT 01/25/2024 FINDINGS: Lower chest: The lung bases are clear. Hepatobiliary: No focal liver abnormality is seen. Slightly diminished hepatomegaly, liver spanning 18.9 cm, previously 20.2 cm. No gallstones, gallbladder wall thickening, or biliary dilatation. Pancreas: Atrophic.  No ductal dilatation or inflammation. Spleen: Normal in size without focal abnormality.  Adrenals/Urinary Tract: No adrenal nodule. There is excreted IV contrast in the renal collecting systems which partially obscures detailed assessment. No hydronephrosis or evidence of renal flow may shin. Excreted IV contrast in the urinary bladder. Stomach/Bowel: Detailed bowel assessment is limited in the absence of enteric contrast and paucity of intra-abdominal fat. Mild wall thickening of the distal esophagus. No bowel obstruction or inflammatory change. The appendix is not confidently visualized on the current exam, no evidence of appendicitis. Small to moderate colonic stool burden. Vascular/Lymphatic: No significant vascular findings are present. No enlarged abdominal or pelvic lymph nodes. Reproductive: IUD in the uterus. The IUD again appears to be malpositioned and rotated. No adnexal mass. Other: No free air, free fluid, or intra-abdominal fluid collection. Musculoskeletal: There are no acute or suspicious osseous abnormalities. IMPRESSION: 1. Mild wall thickening of the distal esophagus, can be seen with reflux or esophagitis. 2. Mild positioned IUD, although unchanged from prior exam. Electronically Signed   By: Andrea Gasman M.D.   On: 05/16/2024 19:29    Microbiology: Recent Results (from the past 240 hours)  Urine Culture (for pregnant, neutropenic or urologic patients or patients with an indwelling urinary catheter)     Status: Abnormal   Collection Time: 05/17/24  2:08 PM   Specimen: Urine, Clean Catch  Result Value Ref Range Status   Specimen Description   Final    URINE, CLEAN CATCH Performed at [facility], [address] [place], [place] [address]    Special Requests   Final    NONE Performed at [facility], [address] [place], [place] [address]    Culture MULTIPLE SPECIES PRESENT, SUGGEST RECOLLECTION (A)  Final   Report Status 05/19/2024 FINAL  Final  MRSA Next Gen by PCR, Nasal     Status: None   Collection Time: 05/18/24   9:51 AM   Specimen: Nasal Mucosa; Nasal Swab  Result Value Ref Range Status   MRSA by PCR Next Gen NOT DETECTED NOT DETECTED Final    Comment: (NOTE) The GeneXpert MRSA Assay (FDA approved for NASAL specimens only), is one component of a comprehensive MRSA colonization surveillance program. It is not intended to diagnose MRSA infection nor to guide or monitor treatment for MRSA infections. Test performance is not FDA approved in patients less than 66 years old. Performed at [facility], [address], [place], [place] [address]  Labs: Basic Metabolic Panel: Recent Labs  Lab 05/17/24 0917 05/17/24 1108 05/18/24 0531 05/18/24 1134 05/18/24 1531 05/18/24 2029 05/19/24 0016 05/19/24 0400 05/20/24 0430  NA  --  133*   < > 132* 131* 131* 135 136 137  K  --  3.1*   < > 3.3* 3.2* 3.4* 3.5 3.3* 3.7  CL  --  96*   < > 100 100 99 102 103 109  CO2  --  22   < > 16* 20* 23 23 24 23   GLUCOSE  --  247*   < > 162* 544* 198* 168* 150* 285*  BUN  --  14   < > 9 8 6 6  <5* <5*  CREATININE  --  0.87   < > 0.68 0.74 0.76 0.70 0.68 0.73  CALCIUM   --  8.8*   < > 8.2* 8.3* 8.3* 8.2* 8.2* 7.5*  MG 1.7 1.9  --  1.5*  --   --   --  2.2 1.8  PHOS  --   --   --  1.5*  --   --   --  2.6  --    < > = values in this interval not displayed.   Liver Function Tests: Recent Labs  Lab 05/16/24 1646 05/17/24 1108 05/18/24 0531  AST 29 31 20   ALT 31 30 25   ALKPHOS 110 96 92  BILITOT 1.0 0.8 1.0  PROT 8.5* 7.0 6.6  ALBUMIN 5.1* 4.2 4.0   Recent Labs  Lab 05/16/24 1646  LIPASE 13   No results for input(s): AMMONIA in the last 168 hours. CBC: Recent Labs  Lab 05/16/24 1646 05/16/24 1653 05/17/24 1108 05/18/24 0531 05/19/24 0400 05/20/24 0430  WBC 20.8*  --  16.4* 16.5* 10.0 7.5  NEUTROABS  --   --  12.9*  --  5.9 2.8  HGB 13.6 14.6 11.9* 12.2 10.1* 8.9*  HCT 39.3 43.0 35.5* 35.6* 30.1* 27.9*  MCV 84.3  --  86.8 86.6 86.7 88.6  PLT 457*  --  375 367 266 207    Cardiac Enzymes: No results for input(s): CKTOTAL, CKMB, CKMBINDEX, TROPONINI in the last 168 hours. BNP: BNP (last 3 results) No results for input(s): BNP in the last 8760 hours.  ProBNP (last 3 results) No results for input(s): PROBNP in the last 8760 hours.  CBG: Recent Labs  Lab 05/19/24 0832 05/19/24 1145 05/19/24 1645 05/19/24 2130 05/20/24 0722  GLUCAP 161* 154* 263* 192* 333*       Signed:  Toribio Hummer MD.  [facility] 05/20/2024, 9:29 AM

## 2024-05-20 NOTE — Progress Notes (Signed)
 PICC Removal Note: PICC line removed from RUE. PICC catheter tip visualized and intact. Pressure dressing applied. No redness, ecchymosis, edema, swelling, or drainage noted at site. Instructions provided on post PICC discharge care, including followup notification instructions.

## 2024-05-20 NOTE — Plan of Care (Signed)
  Problem: Education: Goal: Knowledge of General Education information will improve Description: Including pain rating scale, medication(s)/side effects and non-pharmacologic comfort measures Outcome: Progressing   Problem: Health Behavior/Discharge Planning: Goal: Ability to manage health-related needs will improve Outcome: Progressing   Problem: Clinical Measurements: Goal: Ability to maintain clinical measurements within normal limits will improve Outcome: Progressing Goal: Diagnostic test results will improve Outcome: Progressing Goal: Respiratory complications will improve Outcome: Progressing   Problem: Activity: Goal: Risk for activity intolerance will decrease Outcome: Progressing   

## 2024-05-20 NOTE — Progress Notes (Signed)
   05/20/24 1030  TOC Brief Assessment  Insurance and Status Reviewed  Patient has primary care physician No  Home environment has been reviewed Apartment  Prior level of function: Indpendent with ADL's  Prior/Current Home Services No current home services  Social Drivers of Health Review SDOH reviewed no interventions necessary  Readmission risk has been reviewed Yes  Transition of care needs no transition of care needs at this time   Pt screened. Pt has no PCP listed in chart. Pt has [place] Medicaid and should follow up with them and they can help pt figure out who is listed as her Primary Care Provider. There are no HH, DME or SDOH needs identified. There are no other ICM needs at this time.

## 2024-06-09 ENCOUNTER — Ambulatory Visit: Admitting: Family Medicine

## 2024-06-26 ENCOUNTER — Ambulatory Visit: Admitting: Family Medicine

## 2024-06-26 NOTE — Progress Notes (Signed)
 Hi,  I have reviewed pt chart and found 3 unsuccessfull attempts have been made to pt with no returned calls. Closing RS.  Thank you, Shereen Gin [facility] Assistant Direct Dial : [phone]  Fax: [phone] Website: [email or web address]

## 2024-07-02 ENCOUNTER — Inpatient Hospital Stay (HOSPITAL_BASED_OUTPATIENT_CLINIC_OR_DEPARTMENT_OTHER)
Admission: EM | Admit: 2024-07-02 | Discharge: 2024-07-04 | DRG: 074 | Disposition: A | Discharge status: Home / Self Care | Attending: Student | Admitting: Student

## 2024-07-02 ENCOUNTER — Encounter (HOSPITAL_COMMUNITY): Payer: Self-pay | Admitting: Internal Medicine

## 2024-07-02 ENCOUNTER — Other Ambulatory Visit: Payer: Self-pay

## 2024-07-02 DIAGNOSIS — R112 Nausea with vomiting, unspecified: Secondary | ICD-10-CM | POA: Diagnosis not present

## 2024-07-02 DIAGNOSIS — E1065 Type 1 diabetes mellitus with hyperglycemia: Secondary | ICD-10-CM | POA: Diagnosis present

## 2024-07-02 DIAGNOSIS — E109 Type 1 diabetes mellitus without complications: Secondary | ICD-10-CM | POA: Diagnosis present

## 2024-07-02 DIAGNOSIS — R1110 Vomiting, unspecified: Principal | ICD-10-CM

## 2024-07-02 HISTORY — DX: Anxiety disorder, unspecified: F41.9

## 2024-07-02 LAB — LIPASE, BLOOD: Lipase: 16 U/L (ref 11–51)

## 2024-07-02 LAB — URINALYSIS, ROUTINE W REFLEX MICROSCOPIC
Bacteria, UA: NONE SEEN
Bilirubin Urine: NEGATIVE
Glucose, UA: >1000 mg/dL — AB
Hgb urine dipstick: NEGATIVE
Ketones, ur: >80 mg/dL — AB
Nitrite: NEGATIVE
Specific Gravity, Urine: 1.018 (ref 1.005–1.030)
pH: 8.5 — ABNORMAL HIGH (ref 5.0–8.0)

## 2024-07-02 LAB — GLUCOSE, CAPILLARY
Glucose-Capillary: 402 mg/dL — ABNORMAL HIGH (ref 70–99)
Glucose-Capillary: 303 mg/dL — ABNORMAL HIGH (ref 70–99)
Glucose-Capillary: 202 mg/dL — ABNORMAL HIGH (ref 70–99)
Glucose-Capillary: 121 mg/dL — ABNORMAL HIGH (ref 70–99)

## 2024-07-02 LAB — COMPREHENSIVE METABOLIC PANEL WITH GFR
ALT: 21 U/L (ref 0–44)
AST: 37 U/L (ref 15–41)
Albumin: 4.8 g/dL (ref 3.5–5.0)
Alkaline Phosphatase: 110 U/L (ref 38–126)
Anion gap: 17 — ABNORMAL HIGH (ref 5–15)
BUN: 11 mg/dL (ref 6–20)
CO2: 27 mmol/L (ref 22–32)
Calcium: 10.7 mg/dL — ABNORMAL HIGH (ref 8.9–10.3)
Chloride: 96 mmol/L — ABNORMAL LOW (ref 98–111)
Creatinine, Ser: 0.94 mg/dL (ref 0.44–1.00)
GFR, Estimated: >60 mL/min (ref >60)
Glucose, Bld: 140 mg/dL — ABNORMAL HIGH (ref 70–99)
Potassium: 3.1 mmol/L — ABNORMAL LOW (ref 3.5–5.1)
Sodium: 140 mmol/L (ref 135–145)
Total Bilirubin: 0.5 mg/dL (ref 0.0–1.2)
Total Protein: 8 g/dL (ref 6.5–8.1)

## 2024-07-02 LAB — CBC
HCT: 38 % (ref 36.0–46.0)
Hemoglobin: 13.2 g/dL (ref 12.0–15.0)
MCH: 30.1 pg (ref 26.0–34.0)
MCHC: 34.7 g/dL (ref 30.0–36.0)
MCV: 86.6 fL (ref 80.0–100.0)
Platelets: 420 K/uL — ABNORMAL HIGH (ref 150–400)
RBC: 4.39 MIL/uL (ref 3.87–5.11)
RDW: 14 % (ref 11.5–15.5)
WBC: 13.3 K/uL — ABNORMAL HIGH (ref 4.0–10.5)
nRBC: 0 % (ref 0.0–0.2)

## 2024-07-02 LAB — CBG MONITORING, ED
Glucose-Capillary: 128 mg/dL — ABNORMAL HIGH (ref 70–99)
Glucose-Capillary: 390 mg/dL — ABNORMAL HIGH (ref 70–99)

## 2024-07-02 LAB — HCG, SERUM, QUALITATIVE: Preg, Serum: NEGATIVE

## 2024-07-02 LAB — BETA-HYDROXYBUTYRIC ACID: Beta-Hydroxybutyric Acid: 1.87 mmol/L — ABNORMAL HIGH (ref 0.05–0.27)

## 2024-07-02 LAB — PREGNANCY, URINE: Preg Test, Ur: NEGATIVE

## 2024-07-02 MED ORDER — ONDANSETRON HCL 4 MG/2ML IJ SOLN
4.0000 mg | Freq: Four times a day (QID) | INTRAMUSCULAR | Status: DC | PRN
  Administered 2024-07-02 (×2): 4 mg via INTRAVENOUS
  Filled 2024-07-02 (×3): qty 2

## 2024-07-02 MED ORDER — ONDANSETRON HCL 4 MG PO TABS: 4.0000 mg | ORAL_TABLET | Freq: Four times a day (QID) | ORAL | Status: DC | PRN

## 2024-07-02 MED ORDER — METOCLOPRAMIDE HCL 5 MG/ML IJ SOLN
5.0000 mg | Freq: Once | INTRAMUSCULAR | Status: AC
  Administered 2024-07-02: 5 mg via INTRAVENOUS
  Filled 2024-07-02: qty 2

## 2024-07-02 MED ORDER — ONDANSETRON HCL 4 MG/2ML IJ SOLN
4.0000 mg | Freq: Once | INTRAMUSCULAR | Status: AC
  Administered 2024-07-02: 4 mg via INTRAVENOUS
  Filled 2024-07-02: qty 2

## 2024-07-02 MED ORDER — SODIUM CHLORIDE 0.9 % IV BOLUS
1000.0000 mL | Freq: Once | INTRAVENOUS | Status: AC
  Administered 2024-07-02: 1000 mL via INTRAVENOUS

## 2024-07-02 MED ORDER — DROPERIDOL 2.5 MG/ML IJ SOLN
2.5000 mg | Freq: Once | INTRAMUSCULAR | Status: AC
  Administered 2024-07-02: 2.5 mg via INTRAVENOUS
  Filled 2024-07-02: qty 2

## 2024-07-02 MED ORDER — INSULIN ASPART 100 UNIT/ML IJ SOLN
0.0000 [IU] | Freq: Three times a day (TID) | INTRAMUSCULAR | Status: DC
  Administered 2024-07-02: 3 [IU] via SUBCUTANEOUS
  Administered 2024-07-03: 1 [IU] via SUBCUTANEOUS
  Administered 2024-07-03: 2 [IU] via SUBCUTANEOUS
  Administered 2024-07-03: 5 [IU] via SUBCUTANEOUS
  Administered 2024-07-04: 2 [IU] via SUBCUTANEOUS
  Filled 2024-07-02: qty 5
  Filled 2024-07-02: qty 2
  Filled 2024-07-02: qty 5
  Filled 2024-07-02: qty 1
  Filled 2024-07-02: qty 3

## 2024-07-02 MED ORDER — PANTOPRAZOLE SODIUM 40 MG IV SOLR
40.0000 mg | Freq: Once | INTRAVENOUS | Status: AC
  Administered 2024-07-02: 40 mg via INTRAVENOUS
  Filled 2024-07-02: qty 10

## 2024-07-02 MED ORDER — INSULIN GLARGINE 100 UNIT/ML ~~LOC~~ SOLN
12.0000 [IU] | Freq: Every day | SUBCUTANEOUS | Status: DC
  Administered 2024-07-02 – 2024-07-03 (×2): 12 [IU] via SUBCUTANEOUS
  Filled 2024-07-02 (×3): qty 0.12

## 2024-07-02 MED ORDER — ACETAMINOPHEN 650 MG RE SUPP: 650.0000 mg | Freq: Four times a day (QID) | RECTAL | Status: DC | PRN

## 2024-07-02 MED ORDER — INSULIN ASPART 100 UNIT/ML IJ SOLN
14.0000 [IU] | Freq: Once | INTRAMUSCULAR | Status: AC
  Administered 2024-07-02: 14 [IU] via SUBCUTANEOUS
  Filled 2024-07-02: qty 14

## 2024-07-02 MED ORDER — ACETAMINOPHEN 325 MG PO TABS
650.0000 mg | ORAL_TABLET | Freq: Four times a day (QID) | ORAL | Status: DC | PRN
  Filled 2024-07-02: qty 2

## 2024-07-02 MED ORDER — ENOXAPARIN SODIUM 40 MG/0.4ML IJ SOSY
40.0000 mg | PREFILLED_SYRINGE | INTRAMUSCULAR | Status: DC
  Administered 2024-07-02 – 2024-07-03 (×2): 40 mg via SUBCUTANEOUS
  Filled 2024-07-02 (×2): qty 0.4

## 2024-07-02 MED ORDER — POTASSIUM CHLORIDE CRYS ER 20 MEQ PO TBCR
20.0000 meq | EXTENDED_RELEASE_TABLET | Freq: Once | ORAL | Status: AC
  Administered 2024-07-02: 20 meq via ORAL
  Filled 2024-07-02: qty 1

## 2024-07-02 MED ORDER — POTASSIUM CHLORIDE IN NACL 20-0.45 MEQ/L-% IV SOLN
INTRAVENOUS | Status: DC
  Filled 2024-07-02 (×4): qty 1000

## 2024-07-02 MED ORDER — PROCHLORPERAZINE EDISYLATE 10 MG/2ML IJ SOLN
10.0000 mg | Freq: Four times a day (QID) | INTRAMUSCULAR | Status: DC | PRN
  Administered 2024-07-02 – 2024-07-03 (×3): 10 mg via INTRAVENOUS
  Filled 2024-07-02 (×3): qty 2

## 2024-07-02 MED ORDER — KETOROLAC TROMETHAMINE 30 MG/ML IJ SOLN
30.0000 mg | Freq: Once | INTRAMUSCULAR | Status: AC
  Administered 2024-07-02: 30 mg via INTRAVENOUS
  Filled 2024-07-02: qty 1
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
## 2024-07-02 NOTE — ED Notes (Signed)
 Per mom, pt had another episode of emesis

## 2024-07-02 NOTE — ED Notes (Signed)
 Called Thomas at [facility] for transport 07:42-TC

## 2024-07-02 NOTE — ED Triage Notes (Signed)
 BIB Mother from home. Emesis since last night. Dry heaving on arrival. Endorses body aches. Afebrile.

## 2024-07-02 NOTE — ED Notes (Signed)
 CBG taken before [facility] departure. BG 390, patient states nothing to eat or drink since she's been here.

## 2024-07-02 NOTE — ED Notes (Signed)
[facility] at bedside to transport patient. 

## 2024-07-02 NOTE — ED Provider Notes (Signed)
 [facility] AT [facility] Provider Note   CSN: [id number] Arrival date & time: 07/02/24  9985     Patient presents with: No chief complaint on file.   Sabrina Sutton is a 28 y.o. female.   Patient is a 28 year old female with past medical history of type 1 diabetes, gastroparesis with cyclic vomiting, and hyperemesis related to cannabis use.  Patient presenting here with nausea and vomiting and being unable to keep anything down since this morning.  She denies any fevers or chills.  She denies generalized abdominal cramping with vomiting, but no focal pain.  No fevers or chills.  She does admit to marijuana use, most recently last night.       Prior to Admission medications   Medication Sig Start Date End Date Taking? Authorizing Provider  acetaminophen  (TYLENOL ) 325 MG tablet Take 2 tablets (650 mg total) by mouth every 6 (six) hours as needed for mild pain (pain score 1-3) or fever (or Fever >/= 101). 05/20/24   Sebastian Toribio GAILS, MD  Blood Glucose Monitoring Suppl (BLOOD GLUCOSE MONITOR SYSTEM) w/Device KIT Use to check blood sugar 4 times daily as directed 05/20/24   Sebastian Toribio GAILS, MD  Continuous Glucose Sensor (DEXCOM G7 SENSOR) MISC Apply to skin as directed 05/20/24   Sebastian Toribio GAILS, MD  Glucose Blood (BLOOD GLUCOSE TEST STRIPS) STRP Use up to four times daily as directed. (FOR ICD-10 E10.9, E11.9). 05/20/24   Sebastian Toribio GAILS, MD  glucose blood test strip Use as instructed 03/22/24   Yolande Lamar BROCKS, MD  insulin  aspart (NOVOLOG  FLEXPEN) 100 UNIT/ML FlexPen Inject 4 Units into the skin 3 (three) times daily with meals. 05/20/24   Sebastian Toribio GAILS, MD  insulin  glargine (LANTUS ) 100 UNIT/ML Solostar Pen Inject 12 Units into the skin daily. 05/20/24   Sebastian Toribio GAILS, MD  Insulin  Pen Needle (PEN NEEDLES) 31G X 5 MM MISC Use up to four times daily as directed. (FOR ICD-10 E10.9, E11.9). 05/20/24   Sebastian Toribio GAILS, MD  Insulin  Pen  Needle 32G X 4 MM MISC Use to inject insulin  daily 11/17/22   Howell Lunger, DO  Lancet Device MISC 1 each by Does not apply route as directed. Dispense based on patient and insurance preference. Use up to four times daily as directed. (FOR ICD-10 E10.9, E11.9). 05/20/24   Sebastian Toribio GAILS, MD  Lancets MISC Use up to four times daily as directed. (FOR ICD-10 E10.9, E11.9). 05/20/24   Sebastian Toribio GAILS, MD  metoCLOPramide  (REGLAN ) 5 MG tablet Take 2 tablets (10 mg total) by mouth every 8 (eight) hours as needed for nausea. 05/20/24   Sebastian Toribio GAILS, MD  ondansetron  (ZOFRAN -ODT) 4 MG disintegrating tablet Take 1 tablet (4 mg total) by mouth every 8 (eight) hours as needed for nausea or vomiting. 01/27/24   Amin, Ankit C, MD  pantoprazole  (PROTONIX ) 40 MG tablet Take 1 tablet (40 mg total) by mouth daily. 05/20/24 05/20/25  Sebastian Toribio GAILS, MD    Allergies: Patient has no known allergies.    Review of Systems  All other systems reviewed and are negative.   Updated Vital Signs BP 132/83   Pulse 90   Temp 98.7 F (37.1 C) (Oral)   Resp 19   SpO2 100%   Physical Exam Vitals and nursing note reviewed.  Constitutional:      General: She is not in acute distress.    Appearance: She is well-developed. She is not diaphoretic.  HENT:  Head: Normocephalic and atraumatic.  Cardiovascular:     Rate and Rhythm: Normal rate and regular rhythm.     Heart sounds: No murmur heard.    No friction rub. No gallop.  Pulmonary:     Effort: Pulmonary effort is normal. No respiratory distress.     Breath sounds: Normal breath sounds. No wheezing.  Abdominal:     General: Bowel sounds are normal. There is no distension.     Palpations: Abdomen is soft.     Tenderness: There is no abdominal tenderness.  Musculoskeletal:        General: Normal range of motion.     Cervical back: Normal range of motion and neck supple.  Skin:    General: Skin is warm and dry.  Neurological:     General: No  focal deficit present.     Mental Status: She is alert and oriented to person, place, and time.     (all labs ordered are listed, but only abnormal results are displayed) Labs Reviewed  CBG MONITORING, ED - Abnormal; Notable for the following components:      Result Value   Glucose-Capillary 128 (*)    All other components within normal limits  LIPASE, BLOOD  COMPREHENSIVE METABOLIC PANEL WITH GFR  CBC  URINALYSIS, ROUTINE W REFLEX MICROSCOPIC  PREGNANCY, URINE    EKG: None  Radiology: No results found.   Procedures   Medications Ordered in the ED  sodium chloride  0.9 % bolus 1,000 mL (has no administration in time range)  ketorolac  (TORADOL ) 30 MG/ML injection 30 mg (has no administration in time range)  ondansetron  (ZOFRAN ) injection 4 mg (has no administration in time range)                                    Medical Decision Making Amount and/or Complexity of Data Reviewed Labs: ordered.  Risk Prescription drug management. Decision regarding hospitalization.   Patient is a 28 year old female with history of cyclic vomiting and gastroparesis presenting with nausea and vomiting.  Symptoms have been worsening since this morning.  Patient arrives with stable vital signs and is afebrile.  Physical examination reveals generalized abdominal tenderness, but no peritoneal signs.  Laboratory studies obtained including CBC, metabolic panel, and lipase, all of which are basically unremarkable.  There is no evidence for DKA.  Patient has been hydrated with normal saline and received Zofran  and droperidol  for nausea, but continues to feel nauseated and unable to eat or drink.  Patient will be admitted to the hospitalist service for further management of her gastroparesis/cyclic vomiting.     Final diagnoses:  None    ED Discharge Orders     None          Geroldine Berg, MD 07/02/24 [phone]

## 2024-07-02 NOTE — Inpatient Diabetes Management (Signed)
 Inpatient Diabetes Program Recommendations  AACE/ADA: New Consensus Statement on Inpatient Glycemic Control (2015)  Target Ranges:  Prepandial:   less than 140 mg/dL      Peak postprandial:   less than 180 mg/dL (1-2 hours)      Critically ill patients:  140 - 180 mg/dL   Lab Results  Component Value Date   GLUCAP 303 (H) 07/02/2024   HGBA1C 9.7 (H) 05/17/2024    Review of Glycemic Control  Diabetes history: DM1 Outpatient Diabetes medications: Lantus  12 units daily, Novolog  4 units TID Current orders for Inpatient glycemic control: Lantus  12 units daily, Novolog  0-9 TID  HgbA1C - 9.7% PCP - [facility]  Inpatient Diabetes Program Recommendations:    Consider adding Novolog  2-3 units TID with meals if eating > 50% - on CL diet at present  Spoke to Diabetes Coordinator on 10/26, 10/27 and 05/20/2024 regarding her diabetes control.  Will see in am.   Thank you. Shona Brandy, RD, LDN, CDCES Inpatient Diabetes Coordinator [phone]

## 2024-07-03 DIAGNOSIS — F410 Panic disorder [episodic paroxysmal anxiety] without agoraphobia: Secondary | ICD-10-CM | POA: Diagnosis present

## 2024-07-03 DIAGNOSIS — Z833 Family history of diabetes mellitus: Secondary | ICD-10-CM | POA: Diagnosis not present

## 2024-07-03 DIAGNOSIS — E860 Dehydration: Secondary | ICD-10-CM | POA: Diagnosis present

## 2024-07-03 DIAGNOSIS — E876 Hypokalemia: Secondary | ICD-10-CM | POA: Diagnosis not present

## 2024-07-03 DIAGNOSIS — R030 Elevated blood-pressure reading, without diagnosis of hypertension: Secondary | ICD-10-CM | POA: Diagnosis not present

## 2024-07-03 DIAGNOSIS — D72829 Elevated white blood cell count, unspecified: Secondary | ICD-10-CM | POA: Diagnosis present

## 2024-07-03 DIAGNOSIS — Z83438 Family history of other disorder of lipoprotein metabolism and other lipidemia: Secondary | ICD-10-CM | POA: Diagnosis not present

## 2024-07-03 DIAGNOSIS — F411 Generalized anxiety disorder: Secondary | ICD-10-CM | POA: Diagnosis present

## 2024-07-03 DIAGNOSIS — Z823 Family history of stroke: Secondary | ICD-10-CM | POA: Diagnosis not present

## 2024-07-03 DIAGNOSIS — Z5901 Sheltered homelessness: Secondary | ICD-10-CM | POA: Diagnosis not present

## 2024-07-03 DIAGNOSIS — R1116 Cannabis hyperemesis syndrome: Secondary | ICD-10-CM | POA: Diagnosis present

## 2024-07-03 DIAGNOSIS — K3184 Gastroparesis: Secondary | ICD-10-CM | POA: Diagnosis present

## 2024-07-03 DIAGNOSIS — E1043 Type 1 diabetes mellitus with diabetic autonomic (poly)neuropathy: Secondary | ICD-10-CM | POA: Diagnosis present

## 2024-07-03 DIAGNOSIS — E1065 Type 1 diabetes mellitus with hyperglycemia: Secondary | ICD-10-CM | POA: Diagnosis present

## 2024-07-03 DIAGNOSIS — R112 Nausea with vomiting, unspecified: Secondary | ICD-10-CM | POA: Diagnosis not present

## 2024-07-03 DIAGNOSIS — F1210 Cannabis abuse, uncomplicated: Secondary | ICD-10-CM | POA: Diagnosis present

## 2024-07-03 DIAGNOSIS — Z87891 Personal history of nicotine dependence: Secondary | ICD-10-CM | POA: Diagnosis not present

## 2024-07-03 DIAGNOSIS — R1110 Vomiting, unspecified: Secondary | ICD-10-CM | POA: Diagnosis present

## 2024-07-03 DIAGNOSIS — Z85820 Personal history of malignant melanoma of skin: Secondary | ICD-10-CM | POA: Diagnosis not present

## 2024-07-03 DIAGNOSIS — Z8249 Family history of ischemic heart disease and other diseases of the circulatory system: Secondary | ICD-10-CM | POA: Diagnosis not present

## 2024-07-03 DIAGNOSIS — F1290 Cannabis use, unspecified, uncomplicated: Secondary | ICD-10-CM | POA: Diagnosis not present

## 2024-07-03 LAB — GLUCOSE, CAPILLARY
Glucose-Capillary: 144 mg/dL — ABNORMAL HIGH (ref 70–99)
Glucose-Capillary: 281 mg/dL — ABNORMAL HIGH (ref 70–99)
Glucose-Capillary: 189 mg/dL — ABNORMAL HIGH (ref 70–99)
Glucose-Capillary: 103 mg/dL — ABNORMAL HIGH (ref 70–99)

## 2024-07-03 LAB — CBC
HCT: 34.7 % — ABNORMAL LOW (ref 36.0–46.0)
Hemoglobin: 12.1 g/dL (ref 12.0–15.0)
MCH: 30.3 pg (ref 26.0–34.0)
MCHC: 34.9 g/dL (ref 30.0–36.0)
MCV: 86.8 fL (ref 80.0–100.0)
Platelets: 384 K/uL (ref 150–400)
RBC: 4 MIL/uL (ref 3.87–5.11)
RDW: 14.2 % (ref 11.5–15.5)
WBC: 15.6 K/uL — ABNORMAL HIGH (ref 4.0–10.5)
nRBC: 0 % (ref 0.0–0.2)

## 2024-07-03 LAB — COMPREHENSIVE METABOLIC PANEL WITH GFR
ALT: 22 U/L (ref 0–44)
AST: 26 U/L (ref 15–41)
Albumin: 4 g/dL (ref 3.5–5.0)
Alkaline Phosphatase: 93 U/L (ref 38–126)
Anion gap: 14 (ref 5–15)
BUN: 12 mg/dL (ref 6–20)
CO2: 19 mmol/L — ABNORMAL LOW (ref 22–32)
Calcium: 8.5 mg/dL — ABNORMAL LOW (ref 8.9–10.3)
Chloride: 99 mmol/L (ref 98–111)
Creatinine, Ser: 0.66 mg/dL (ref 0.44–1.00)
GFR, Estimated: >60 mL/min (ref >60)
Glucose, Bld: 123 mg/dL — ABNORMAL HIGH (ref 70–99)
Potassium: 3.9 mmol/L (ref 3.5–5.1)
Sodium: 132 mmol/L — ABNORMAL LOW (ref 135–145)
Total Bilirubin: 0.7 mg/dL (ref 0.0–1.2)
Total Protein: 6.8 g/dL (ref 6.5–8.1)

## 2024-07-03 LAB — MAGNESIUM: Magnesium: 2 mg/dL (ref 1.7–2.4)

## 2024-07-03 LAB — PHOSPHORUS: Phosphorus: 1.8 mg/dL — ABNORMAL LOW (ref 2.5–4.6)

## 2024-07-03 MED ORDER — INSULIN GLARGINE 100 UNIT/ML ~~LOC~~ SOLN
15.0000 [IU] | Freq: Every day | SUBCUTANEOUS | Status: DC
  Administered 2024-07-04: 15 [IU] via SUBCUTANEOUS
  Filled 2024-07-03: qty 0.15

## 2024-07-03 MED ORDER — LACTATED RINGERS IV SOLN: INTRAVENOUS | Status: DC

## 2024-07-03 MED ORDER — SODIUM PHOSPHATES 45 MMOLE/15ML IV SOLN
30.0000 mmol | Freq: Once | INTRAVENOUS | Status: AC
  Administered 2024-07-03: 30 mmol via INTRAVENOUS
  Filled 2024-07-03: qty 10

## 2024-07-03 MED ORDER — INSULIN ASPART 100 UNIT/ML IJ SOLN
3.0000 [IU] | Freq: Three times a day (TID) | INTRAMUSCULAR | Status: DC
  Administered 2024-07-03 – 2024-07-04 (×3): 3 [IU] via SUBCUTANEOUS
  Filled 2024-07-03 (×2): qty 3

## 2024-07-03 MED ORDER — HYDROXYZINE HCL 25 MG PO TABS
25.0000 mg | ORAL_TABLET | Freq: Three times a day (TID) | ORAL | Status: DC | PRN
  Administered 2024-07-03: 50 mg via ORAL
  Administered 2024-07-03: 25 mg via ORAL
  Filled 2024-07-03: qty 2
  Filled 2024-07-03: qty 1

## 2024-07-03 MED ADMIN — Metoclopramide HCl Inj 5 MG/ML (Base Equivalent): 5 mg | INTRAVENOUS | NDC 00409341421

## 2024-07-03 MED FILL — Metoclopramide HCl Inj 5 MG/ML (Base Equivalent): 5.0000 mg | INTRAMUSCULAR | Qty: 2 | Status: AC

## 2024-07-03 NOTE — Progress Notes (Signed)
 Patient expressed concerns to me repeatedly of her anxiety being out of control so MD notified and Psych consulted. She stated she has always had anxiety but after having her daughter, who is now 26 year old, her anxiety is even worse and she reported to me frequent panic attacks and feeling like she can never calm her body down. She frequently became tearful/sobbing during normal conversations, which happened as well when other nurses went in to answer her call bell.   She said she works part-time at [employer] and lives at a hotel with her partner and daughter who intermittently stays with her mother/sister. She said she has good family support but has had a very hard year getting evicted from her apartment earlier this year and having trouble getting a new apartment. She is motivated to provide for her daughter and keep working, and is working with a child psychotherapist to find housing options. She says her DM1 and health issues makes it harder. She said she uses marijuana to help calm herself down, but would like to take medication instead, as it makes her have N/V. I gave patient 25 mg of Atarax  which she stated helped her and she appears much more calm to me.

## 2024-07-03 NOTE — Inpatient Diabetes Management (Signed)
 Inpatient Diabetes Program Recommendations  AACE/ADA: New Consensus Statement on Inpatient Glycemic Control (2015)  Target Ranges:  Prepandial:   less than 140 mg/dL      Peak postprandial:   less than 180 mg/dL (1-2 hours)      Critically ill patients:  140 - 180 mg/dL   Lab Results  Component Value Date   GLUCAP 144 (H) 07/03/2024   HGBA1C 9.7 (H) 05/17/2024    Review of Glycemic Control  Diabetes history: DM2 Outpatient Diabetes medications: Lantus  12 daily, Novolog  4 units TID, Dexcom G7 Current orders for Inpatient glycemic control: Lantus  12 daily, Novolog  0-9 TID  HgbA1C - 9.7%  Inpatient Diabetes Program Recommendations:    Consider adding Novolog  2-3 units TID with meals if eating > 50%. On [place] diet at present.  TOC social work consult for assistance with obtaining a PCP. Pt states she was dismissed from previous practice for several no-shows and is having trouble securing a PCP.  Spoke with pt at bedside. Aware that blood sugars have been elevated, as pt has CGM. Does not miss her insulin  doses. Her biggest issue has been obtaining PCP for managing her DM.   Discussed A1C results (9.7%) and explained that current A1C indicates an average glucose of 232 mg/dl over the past 2-3 months. Discussed glucose and A1C goals. Discussed importance of checking CBGs and maintaining good CBG control to prevent long-term and short-term complications. Explained how hyperglycemia leads to damage within blood vessels which lead to the common complications seen with uncontrolled diabetes. Stressed to the patient the importance of improving glycemic control to prevent further complications from uncontrolled diabetes. Discussed impact of nutrition, exercise, stress, sickness, and medications on diabetes control. Reviewed hypoglycemia s/s and treatment.  Answered all questions/concerns.  Continue to follow glucose trends.  Thank you. Shona Brandy, RD, LDN, CDCES Inpatient Diabetes  Coordinator [phone]

## 2024-07-03 NOTE — Progress Notes (Signed)
 PROGRESS NOTE  Sabrina Sutton FMW:[id number] DOB: November 17, 1995   PCP: Patient, No Pcp Per  Patient is from: Home.  DOA: 07/02/2024 LOS: 0  Chief complaints No chief complaint on file.    Brief Narrative / Interim history: 28 year old F with PMH of DM-1, anxiety, depression, marijuana and tobacco use presented to ED with nausea and vomiting, and admitted with working diagnosis of intractable nausea and vomiting.  In ED, stable vitals.  WBC 13.3.  CT is negative.  Not in DKA or HHS.  UA with ketonuria.  Started on IV fluids and antiemetics and admitted.   Subjective: Seen and examined earlier this morning.  No major events overnight or this morning.  Reports improvement in her nausea and vomiting.  Still with some nausea but no emesis.  Last emesis was about midnight.  Denies abdominal pain, diarrhea, UTI or cardiopulmonary symptoms.  Reports history of anxiety that she has been managing with marijuana.  She tried BuSpar in the past that did not help.   Assessment and plan: Intractable nausea and vomiting: Likely due to diabetic gastroparesis and cannabinoid hyperemesis.  Still with some nausea but no emesis since last night.  Abdominal exam benign.  -Scheduled Reglan  ACHS -Advance to full liquid diet -Continue PPI and IV fluid. -Encourage marijuana cessation  Uncontrolled DM-1 with hyperglycemia: A1c 9.7% on 10/25. Recent Labs  Lab 07/02/24 1236 07/02/24 1610 07/02/24 2031 07/03/24 0750 07/03/24 1128  GLUCAP 303* 202* 121* 144* 281*  -Continue SSI-sensitive -Add NovoLog  3 units 3 times daily with meals starting now -Will increase basal from 12 to 15 units starting tomorrow -Appreciate help by diabetic coordinator  Hypokalemia/hypophosphatemia -Monitor replenish K, P and Mg as appropriate  Hypercalcemia: Likely due to dehydration.  Resolved.  Elevated BP: Likely stress-induced.  Improved  Tobacco use disorder Marijuana use - Encourage cessation. - Not interested  in nicotine  patch.  Anxiety: Report of anxiety state by staff but she appeared calm during my evaluation.  She states BuSpar did not help in the past. -Psychiatry consulted-I recommended starting Atarax  25 to 50 mg and UDS   Leukocytosis: Likely demargination. -Recheck in the morning  Body mass index is 21.81 kg/m.          DVT prophylaxis:  enoxaparin  (LOVENOX ) injection 40 mg Start: 07/02/24 2200  Code Status: Full code Family Communication: None at bedside Level of care: Telemetry Status is: Observation The patient will require care spanning > 2 midnights and should be moved to inpatient because: Intractable nausea and vomiting   Final disposition: Home   55 minutes with more than 50% spent in reviewing records, counseling patient/family and coordinating care.  Consultants:  Psychiatry  Procedures: None  Microbiology summarized: None  Objective: Vitals:   07/02/24 1955 07/02/24 2102 07/03/24 0444 07/03/24 1300  BP:  131/89 125/70 (!) 134/94  Pulse:  (!) 106 91 82  Resp:  19 16 20   Temp:  98.4 F (36.9 C) 98.2 F (36.8 C) 98.7 F (37.1 C)  TempSrc:  Oral Oral Oral  SpO2:  100% 99% 100%  Weight: 61.3 kg     Height: 5' 6 (1.676 m)       Examination:  GENERAL: No apparent distress.  Nontoxic. HEENT: MMM.  Vision and hearing grossly intact.  NECK: Supple.  No apparent JVD.  RESP:  No IWOB.  Fair aeration bilaterally. CVS:  RRR. Heart sounds normal.  ABD/GI/GU: BS+. Abd soft, NTND.  MSK/EXT:  Moves extremities. No apparent deformity. No edema.  SKIN: no apparent  skin lesion or wound NEURO: AA.  Oriented appropriately.  No apparent focal neuro deficit. PSYCH: Calm. Normal affect.   Sch Meds:  Scheduled Meds:  enoxaparin  (LOVENOX ) injection  40 mg Subcutaneous Q24H   insulin  aspart  0-9 Units Subcutaneous TID WC   insulin  glargine  12 Units Subcutaneous Daily   metoCLOPramide  (REGLAN ) injection  5 mg Intravenous TID AC & HS   Continuous  Infusions:  sodium PHOSPHATE  IVPB (in mmol) 30 mmol (07/03/24 1230)   PRN Meds:.acetaminophen  **OR** acetaminophen , hydrOXYzine , ondansetron  **OR** ondansetron  (ZOFRAN ) IV, prochlorperazine   Antimicrobials: Anti-infectives (From admission, onward)    None        I have personally reviewed the following labs and images: CBC: Recent Labs  Lab 07/02/24 0032 07/03/24 0408  WBC 13.3* 15.6*  HGB 13.2 12.1  HCT 38.0 34.7*  MCV 86.6 86.8  PLT 420* 384   BMP &GFR Recent Labs  Lab 07/02/24 0032 07/03/24 0408  NA 140 132*  K 3.1* 3.9  CL 96* 99  CO2 27 19*  GLUCOSE 140* 123*  BUN 11 12  CREATININE 0.94 0.66  CALCIUM  10.7* 8.5*  MG  --  2.0  PHOS  --  1.8*   Estimated Creatinine Clearance: 99.8 mL/min (by C-G formula based on SCr of 0.66 mg/dL). Liver & Pancreas: Recent Labs  Lab 07/02/24 0032 07/03/24 0408  AST 37 26  ALT 21 22  ALKPHOS 110 93  BILITOT 0.5 0.7  PROT 8.0 6.8  ALBUMIN 4.8 4.0   Recent Labs  Lab 07/02/24 0032  LIPASE 16   No results for input(s): AMMONIA in the last 168 hours. Diabetic: No results for input(s): HGBA1C in the last 72 hours. Recent Labs  Lab 07/02/24 1236 07/02/24 1610 07/02/24 2031 07/03/24 0750 07/03/24 1128  GLUCAP 303* 202* 121* 144* 281*   Cardiac Enzymes: No results for input(s): CKTOTAL, CKMB, CKMBINDEX, TROPONINI in the last 168 hours. No results for input(s): PROBNP in the last 8760 hours. Coagulation Profile: No results for input(s): INR, PROTIME in the last 168 hours. Thyroid  Function Tests: No results for input(s): TSH, T4TOTAL, FREET4, T3FREE, THYROIDAB in the last 72 hours. Lipid Profile: No results for input(s): CHOL, HDL, LDLCALC, TRIG, CHOLHDL, LDLDIRECT in the last 72 hours. Anemia Panel: No results for input(s): VITAMINB12, FOLATE, FERRITIN, TIBC, IRON, RETICCTPCT in the last 72 hours. Urine analysis:    Component Value Date/Time   COLORURINE  YELLOW 07/02/2024 0500   APPEARANCEUR CLEAR 07/02/2024 0500   APPEARANCEUR Clear 03/29/2022 1154   LABSPEC 1.018 07/02/2024 0500   PHURINE 8.5 (H) 07/02/2024 0500   GLUCOSEU >1,000 (A) 07/02/2024 0500   HGBUR NEGATIVE 07/02/2024 0500   BILIRUBINUR NEGATIVE 07/02/2024 0500   BILIRUBINUR negative 12/05/2022 1437   BILIRUBINUR Negative 03/29/2022 1154   KETONESUR >80 (A) 07/02/2024 0500   PROTEINUR TRACE (A) 07/02/2024 0500   UROBILINOGEN 0.2 12/05/2022 1437   UROBILINOGEN 0.2 12/18/2019 1947   NITRITE NEGATIVE 07/02/2024 0500   LEUKOCYTESUR SMALL (A) 07/02/2024 0500   Sepsis Labs: Invalid input(s): PROCALCITONIN, LACTICIDVEN  Microbiology: No results found for this or any previous visit (from the past 240 hours).  Radiology Studies: No results found.    Sabrina Sutton T. Nancylee Gaines [facility]  If 7PM-7AM, please contact night-coverage [email or web address] 07/03/2024, 1:30 PM

## 2024-07-04 ENCOUNTER — Other Ambulatory Visit (HOSPITAL_COMMUNITY): Payer: Self-pay

## 2024-07-04 ENCOUNTER — Encounter (HOSPITAL_COMMUNITY): Payer: Self-pay | Admitting: Family

## 2024-07-04 DIAGNOSIS — F411 Generalized anxiety disorder: Secondary | ICD-10-CM

## 2024-07-04 DIAGNOSIS — F1290 Cannabis use, unspecified, uncomplicated: Secondary | ICD-10-CM

## 2024-07-04 LAB — COMPREHENSIVE METABOLIC PANEL WITH GFR
ALT: 20 U/L (ref 0–44)
AST: 19 U/L (ref 15–41)
Albumin: 3.9 g/dL (ref 3.5–5.0)
Alkaline Phosphatase: 90 U/L (ref 38–126)
Anion gap: 13 (ref 5–15)
BUN: 10 mg/dL (ref 6–20)
CO2: 21 mmol/L — ABNORMAL LOW (ref 22–32)
Calcium: 8.9 mg/dL (ref 8.9–10.3)
Chloride: 101 mmol/L (ref 98–111)
Creatinine, Ser: 0.7 mg/dL (ref 0.44–1.00)
GFR, Estimated: >60 mL/min (ref >60)
Glucose, Bld: 224 mg/dL — ABNORMAL HIGH (ref 70–99)
Potassium: 3.7 mmol/L (ref 3.5–5.1)
Sodium: 135 mmol/L (ref 135–145)
Total Bilirubin: 0.6 mg/dL (ref 0.0–1.2)
Total Protein: 6.6 g/dL (ref 6.5–8.1)

## 2024-07-04 LAB — CBC
HCT: 40.1 % (ref 36.0–46.0)
Hemoglobin: 13.1 g/dL (ref 12.0–15.0)
MCH: 30.3 pg (ref 26.0–34.0)
MCHC: 32.7 g/dL (ref 30.0–36.0)
MCV: 92.6 fL (ref 80.0–100.0)
Platelets: 297 K/uL (ref 150–400)
RBC: 4.33 MIL/uL (ref 3.87–5.11)
RDW: 14.5 % (ref 11.5–15.5)
WBC: 8.8 K/uL (ref 4.0–10.5)
nRBC: 0 % (ref 0.0–0.2)

## 2024-07-04 LAB — URINE DRUG SCREEN
Amphetamines: NEGATIVE
Barbiturates: NEGATIVE
Benzodiazepines: NEGATIVE
Cocaine: NEGATIVE
Fentanyl: NEGATIVE
Methadone Scn, Ur: NEGATIVE
Opiates: NEGATIVE
Tetrahydrocannabinol: POSITIVE — AB

## 2024-07-04 LAB — MAGNESIUM: Magnesium: 1.9 mg/dL (ref 1.7–2.4)

## 2024-07-04 LAB — GLUCOSE, CAPILLARY: Glucose-Capillary: 169 mg/dL — ABNORMAL HIGH (ref 70–99)

## 2024-07-04 MED ORDER — INSULIN LISPRO (1 UNIT DIAL) 100 UNIT/ML (KWIKPEN)
4.0000 [IU] | PEN_INJECTOR | Freq: Three times a day (TID) | SUBCUTANEOUS | 0 refills | Status: DC
  Filled 2024-07-04: qty 3, 25d supply, fill #0

## 2024-07-04 MED ORDER — INSULIN GLARGINE 100 UNIT/ML SOLOSTAR PEN
12.0000 [IU] | PEN_INJECTOR | Freq: Every day | SUBCUTANEOUS | 0 refills | Status: DC
  Filled 2024-07-04: qty 15, 125d supply, fill #0

## 2024-07-04 MED ORDER — METOCLOPRAMIDE HCL 5 MG PO TABS
5.0000 mg | ORAL_TABLET | Freq: Three times a day (TID) | ORAL | 0 refills | Status: DC
  Filled 2024-07-04: qty 90, 30d supply, fill #0

## 2024-07-04 MED ORDER — HYDROXYZINE HCL 25 MG PO TABS
25.0000 mg | ORAL_TABLET | Freq: Three times a day (TID) | ORAL | 0 refills | Status: AC | PRN
  Filled 2024-07-04: qty 90, 30d supply, fill #0

## 2024-07-04 MED ADMIN — Metoclopramide HCl Inj 5 MG/ML (Base Equivalent): 5 mg | INTRAVENOUS | NDC 00409341421

## 2024-07-04 NOTE — TOC Transition Note (Incomplete)
 Transition of Care [facility]) - Discharge Note   Patient Details  Name: Sabrina Sutton MRN: [id number] Date of Birth: 03/19/1996  Transition of [facility]) CM/SW Contact:  Tawni CHRISTELLA Eva, LCSW Phone Number: 07/04/2024, 10:48 AM   Clinical Narrative:     CSW spoke with pt   Final next level of care: Home/Self Care Barriers to Discharge: Barriers Resolved   Patient Goals and CMS Choice            Discharge Placement                       Discharge Plan and Services Additional resources added to the After Visit Summary for                                       Social Drivers of Health (SDOH) Interventions SDOH Screenings   Food Insecurity: Food Insecurity Present (07/02/2024)  Housing: High Risk (07/02/2024)  Transportation Needs: No Transportation Needs (07/02/2024)  Utilities: Not At Risk (07/02/2024)  Alcohol  Screen: Low Risk (08/05/2023)  Depression (PHQ2-9): Low Risk (04/28/2024)  Financial Resource Strain: Medium Risk (08/05/2023)  Physical Activity: Unknown (08/05/2023)  Social Connections: Moderately Isolated (07/02/2024)  Stress: No Stress Concern Present (08/05/2023)  Tobacco Use: Medium Risk (07/02/2024)     Readmission Risk Interventions    05/20/2024   10:30 AM  Readmission Risk Prevention Plan  Transportation Screening Complete  PCP or Specialist Appt within 5-7 Days Complete  Home Care Screening Complete  Medication Review (RN CM) Complete

## 2024-07-04 NOTE — Plan of Care (Signed)

## 2024-07-04 NOTE — Discharge Instructions (Signed)
 [facility] Primary Care List    Bascom Claudene Borer, [place] Specialty: Internal Medicine, Nurse Practitioner  [facility] [address] Jew[address], [place] [address] Main Phone: [phone]  [facility]   Lauraine FORBES Pereyra, NP Specialty: Geriatrics, Internal Medicine  [facility] [address] [place], [place] [address] Main Phone: [phone]  [facility]   Boby LITTIE Mackintosh, NP Specialty: Internal Medicine  [facility] [address] [place], [place] [address] Main Phone: [phone]  [facility]   Debby LITTIE Molt, MD Specialty: Internal Medicine  [facility] at [facility] [address] [place], [place] [address] Main Phone: [phone]  [facility]   Corean Morgan Ku, FNP-C Specialty: Internal Medicine, Nurse Practitioner  [facility] at [facility] [address] [place], [place] [address] Main Phone: [phone]  [facility]   Iuka. Purcell, MD Specialty: Internal Medicine  [facility] [address] [place], [place] [address] Main Phone: [phone]  [facility]   Harlene Venson An, NP Specialty: Geriatrics, Internal Medicine  [facility] [address] [place], [place] [address] Main Phone: [phone]  [facility]   Amy Almarie Cluster, NP Specialty: Geriatrics, Internal Medicine  [facility] [address] [place], [place] [address] Main Phone: [phone]  [facility]   Dinah JAYSON Plough, FNP-C, NP Specialty: Internal Medicine  [facility] [address] [place], [place] [address] Main Phone:  [phone]  [facility]   Gaines Nichole Ada, [place] Specialty: Internal Medicine  [facility] [address] [place], [place] [address] Main Phone: [phone]  [facility]   Bruna Creighton, FNP-C Specialty: Internal Medicine  [facility] [address] [place] [address] [place], [place] [address] Main Phone: [phone]  [facility]   Estela Myrick Ly, MD Specialty: Internal Medicine  [facility] [address] [place], [place] [address] Main Phone: [phone]  [facility]   Bernardino Cordella Cone, MD Specialty: Internal Medicine  [facility] at [facility] [address] [place], [place] [address] Main Phone: [phone]  [facility]   Greig Drones, NP Specialty: Internal Medicine  [facility] at [facility] [address] [place], [place] [address] Main Phone: [phone]  [facility]   Tinnie Arlean Harada, NP Specialty: Internal Medicine, Nurse Practitioner  [facility] [address] [place], [place] [address] Main Phone: [phone]  [facility]   Cleatus Specking, MD Specialty: Internal Medicine  [facility] [address] [place], [place] [address] Main Phone: [phone]  [facility]   Neale Carpen, [place] Specialty: Adult Gerontology Nurse Practitioner, Internal Medicine  [facility] [phone] [address] [place], [place] [address] Main Phone: [phone]  [facility]   Angeline Trudy Laura, NP Specialty: Internal Medicine  [facility] [address]  [place], [place] [address] Main Phone: [phone]  [facility]  Harlene Saddler, MD Specialty: Internal Medicine  [facility] at [facility] [address] [place], [place] [address] Main Phone: [phone]  [facility]

## 2024-07-04 NOTE — Progress Notes (Signed)
 Discharge Medications delivered from TOC meds to bed [facility] outpatient pharmacy by this RN.
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
## 2024-07-04 NOTE — Discharge Summary (Signed)
 Physician Discharge Summary  Tyreshia Ingman FMW:[id number] DOB: February 09, 1996 DOA: 07/02/2024  PCP: Patient, No Pcp Per  Admit date: 07/02/2024 Discharge date: 07/04/2024  Admitted From: Motel Disposition: Motel Recommendations for Outpatient Follow-up:  Encouraged to reestablish care with PCP as soon as possible Ambulatory referral to [facility] as below Check glycemic control, CMP and CBC at follow-up Please follow up on the following pending results: None  Home Health: No need identified Equipment/Devices: No need identified  Discharge Condition: Stable CODE STATUS: Full code   Follow-up Information     [facility] Follow up.   Specialty: Urgent Care Why: A referreal has been initiated on your behalf for Partial Hospitalization Program (PHP) at [facility]). The PHP team will contact to schedule intake assessment. Outpatient follow-up for individual counseling and medication manegement also available at [facility] second floor. Call for appointment or walk-in hours. Contact information: [address] [place]  [phone]                Hospital course 28 year old F with PMH of DM-1, anxiety, depression, marijuana and tobacco use presented to ED with nausea and vomiting, and admitted with working diagnosis of intractable nausea and vomiting.   In ED, stable vitals.  WBC 13.3.  CT is negative.  Not in DKA or HHS.  UA with ketonuria.  Started on IV fluids and antiemetics and admitted. The next day, symptoms improved.  Continued on scheduled Reglan .  Advance to full liquid diet.  Also evaluated by psychiatry for anxiety and started on Atarax .  On the day of discharge, GI symptoms resolved.  Tolerating regular diet.  Prescribed Reglan .  Also renewed her insulin  prescriptions.  Cleared for discharge by psychiatry.  She is discharged on Atarax  25 mg 3 times  daily as needed for anxiety.  Ambulatory referral to PHP as above.  See individual problem list below for more.   Problems addressed during this hospitalization Intractable nausea and vomiting: Likely due to diabetic gastroparesis and cannabinoid hyperemesis.  Still with some nausea but no emesis since last night.  Abdominal exam benign.  GI symptoms resolved.  Tolerating regular diet. - Continue Reglan  -Encouraged marijuana cessation   Uncontrolled DM-1 with hyperglycemia and gastroparesis: A1c 9.7% on 10/25. - Renewed her prescriptions for insulin . - Reglan  AC.  Hypokalemia/hypophosphatemia: Resolved.   Hypercalcemia: Likely due to dehydration.  Resolved.   Elevated BP: Likely stress-induced.  Normotensive.   Tobacco use disorder Marijuana use - Encourage cessation.   Anxiety/stress: Patient reports feeling stressed about her living condition.  She has one-year-old child and lives in a motel.  She is not in crisis.  Denies SI or HI.  She was started on Atarax  by psychiatry and reports feeling better. -Cleared for discharge by psychiatry on Atarax  25 mg 3 times daily as needed anxiety -Referred to PHP by psychiatry   Leukocytosis: Likely demargination.  Resolved.  Body mass index is 21.81 kg/m.           Consultations: Psychiatry  Time spent 35  minutes  Vital signs Vitals:   07/03/24 0444 07/03/24 1300 07/03/24 1943 07/04/24 0334  BP: 125/70 (!) 134/94 115/78 120/72  Pulse: 91 82 97 65  Temp: 98.2 F (36.8 C) 98.7 F (37.1 C) 98 F (36.7 C) 97.7 F (36.5 C)  Resp: 16 20 18 18   Height:      Weight:      SpO2: 99% 100% 100% 100%  TempSrc: Oral Oral Oral Oral  BMI (Calculated):         Discharge exam  GENERAL: No apparent distress.  Nontoxic. HEENT: MMM.  Vision and hearing grossly intact.  NECK: Supple.  No apparent JVD.  RESP:  No IWOB.  Fair aeration bilaterally. CVS:  RRR. Heart sounds normal.  ABD/GI/GU: BS+. Abd soft, NTND.  MSK/EXT:  Moves  extremities. No apparent deformity. No edema.  SKIN: no apparent skin lesion or wound NEURO: Awake and alert. Oriented appropriately.  No apparent focal neuro deficit. PSYCH: Calm. Normal affect.   Discharge Instructions Discharge Instructions     Discharge instructions   Complete by: As directed    It has been a pleasure taking care of you!  You were hospitalized due to intractable nausea and vomiting likely due to gastroparesis and marijuana.  We have started you on Reglan  to help with gastroparesis.  Marijuana can also cause cyclic vomiting.  We strongly recommend you stop smoking marijuana and cigarettes.  Use your insulin  as prescribed.  We have also started you on medication for anxiety. Follow-up with your primary care doctor or establish care with any doctor as soon as possible.   Take care,   Increase activity slowly   Complete by: As directed       Allergies as of 07/04/2024   No Known Allergies      Medication List     STOP taking these medications    NovoLOG  FlexPen 100 UNIT/ML FlexPen Generic drug: insulin  aspart Replaced by: insulin  lispro 100 UNIT/ML KwikPen   ondansetron  4 MG disintegrating tablet Commonly known as: ZOFRAN -ODT       TAKE these medications    Accu-Chek Guide Me w/Device Kit Use to check blood sugar 4 times daily as directed   Accu-Chek Softclix Lancets lancets Use up to four times daily as directed. (FOR ICD-10 E10.9, E11.9).   acetaminophen  325 MG tablet Commonly known as: TYLENOL  Take 2 tablets (650 mg total) by mouth every 6 (six) hours as needed for mild pain (pain score 1-3) or fever (or Fever >/= 101).   Dexcom G7 Sensor Misc Apply to skin as directed   glucose blood test strip Use as instructed   Accu-Chek Guide Test test strip Generic drug: glucose blood Use up to four times daily as directed. (FOR ICD-10 E10.9, E11.9).   hydrOXYzine  25 MG tablet Commonly known as: ATARAX  Take 1 tablet (25 mg total) by mouth  every 8 (eight) hours as needed for anxiety.   insulin  glargine 100 UNIT/ML Solostar Pen Commonly known as: LANTUS  Inject 12 Units into the skin daily.   insulin  lispro 100 UNIT/ML KwikPen Commonly known as: HumaLOG  KwikPen Inject 4 Units into the skin 3 (three) times daily with meals. Replaces: NovoLOG  FlexPen 100 UNIT/ML FlexPen   metoCLOPramide  5 MG tablet Commonly known as: Reglan  Take 1 tablet (5 mg total) by mouth 3 (three) times daily before meals.   OneTouch Delica Plus Lancing Misc 1 each by Does not apply route as directed. Dispense based on patient and insurance preference. Use up to four times daily as directed. (FOR ICD-10 E10.9, E11.9).   pantoprazole  40 MG tablet Commonly known as: Protonix  Take 1 tablet (40 mg total) by mouth daily.   TechLite Pen Needles 32G X 4 MM Misc Generic drug: Insulin  Pen Needle Use to inject insulin  daily   Insupen Pen Needles 31G X 5 MM Misc Generic drug: Insulin  Pen Needle Use up to four times daily as directed. (FOR ICD-10  E10.9, E11.9).         Procedures/Studies:   No results found.     The results of significant diagnostics from this hospitalization (including imaging, microbiology, ancillary and laboratory) are listed below for reference.     Microbiology: No results found for this or any previous visit (from the past 240 hours).   Labs:  CBC: Recent Labs  Lab 07/02/24 0032 07/03/24 0408 07/04/24 0923  WBC 13.3* 15.6* 8.8  HGB 13.2 12.1 13.1  HCT 38.0 34.7* 40.1  MCV 86.6 86.8 92.6  PLT 420* 384 297   BMP &GFR Recent Labs  Lab 07/02/24 0032 07/03/24 0408 07/04/24 0923  NA 140 132* 135  K 3.1* 3.9 3.7  CL 96* 99 101  CO2 27 19* 21*  GLUCOSE 140* 123* 224*  BUN 11 12 10   CREATININE 0.94 0.66 0.70  CALCIUM  10.7* 8.5* 8.9  MG  --  2.0 1.9  PHOS  --  1.8*  --    Estimated Creatinine Clearance: 99.8 mL/min (by C-G formula based on SCr of 0.7 mg/dL). Liver & Pancreas: Recent Labs  Lab  07/02/24 0032 07/03/24 0408 07/04/24 0923  AST 37 26 19  ALT 21 22 20   ALKPHOS 110 93 90  BILITOT 0.5 0.7 0.6  PROT 8.0 6.8 6.6  ALBUMIN 4.8 4.0 3.9   Recent Labs  Lab 07/02/24 0032  LIPASE 16   No results for input(s): AMMONIA in the last 168 hours. Diabetic: No results for input(s): HGBA1C in the last 72 hours. Recent Labs  Lab 07/03/24 0750 07/03/24 1128 07/03/24 1614 07/03/24 2104 07/04/24 0755  GLUCAP 144* 281* 189* 103* 169*   Cardiac Enzymes: No results for input(s): CKTOTAL, CKMB, CKMBINDEX, TROPONINI in the last 168 hours. No results for input(s): PROBNP in the last 8760 hours. Coagulation Profile: No results for input(s): INR, PROTIME in the last 168 hours. Thyroid  Function Tests: No results for input(s): TSH, T4TOTAL, FREET4, T3FREE, THYROIDAB in the last 72 hours. Lipid Profile: No results for input(s): CHOL, HDL, LDLCALC, TRIG, CHOLHDL, LDLDIRECT in the last 72 hours. Anemia Panel: No results for input(s): VITAMINB12, FOLATE, FERRITIN, TIBC, IRON, RETICCTPCT in the last 72 hours. Urine analysis:    Component Value Date/Time   COLORURINE YELLOW 07/02/2024 0500   APPEARANCEUR CLEAR 07/02/2024 0500   APPEARANCEUR Clear 03/29/2022 1154   LABSPEC 1.018 07/02/2024 0500   PHURINE 8.5 (H) 07/02/2024 0500   GLUCOSEU >1,000 (A) 07/02/2024 0500   HGBUR NEGATIVE 07/02/2024 0500   BILIRUBINUR NEGATIVE 07/02/2024 0500   BILIRUBINUR negative 12/05/2022 1437   BILIRUBINUR Negative 03/29/2022 1154   KETONESUR >80 (A) 07/02/2024 0500   PROTEINUR TRACE (A) 07/02/2024 0500   UROBILINOGEN 0.2 12/05/2022 1437   UROBILINOGEN 0.2 12/18/2019 1947   NITRITE NEGATIVE 07/02/2024 0500   LEUKOCYTESUR SMALL (A) 07/02/2024 0500   Sepsis Labs: Invalid input(s): PROCALCITONIN, LACTICIDVEN   SIGNED:  Mignon ONEIDA Bump, MD  [facility] 07/04/2024, 5:29 PM

## 2024-07-10 ENCOUNTER — Ambulatory Visit (HOSPITAL_COMMUNITY)
Admission: EM | Admit: 2024-07-10 | Discharge: 2024-07-10 | Disposition: A | Discharge status: Home / Self Care | Source: Home / Self Care

## 2024-07-10 ENCOUNTER — Encounter (HOSPITAL_COMMUNITY): Payer: Self-pay

## 2024-07-10 DIAGNOSIS — R197 Diarrhea, unspecified: Secondary | ICD-10-CM | POA: Diagnosis not present

## 2024-07-10 DIAGNOSIS — E1065 Type 1 diabetes mellitus with hyperglycemia: Secondary | ICD-10-CM | POA: Diagnosis not present

## 2024-07-10 LAB — POC COVID19/FLU A&B COMBO
Covid Antigen, POC: NEGATIVE
Influenza A Antigen, POC: NEGATIVE
Influenza B Antigen, POC: NEGATIVE

## 2024-07-10 LAB — BASIC METABOLIC PANEL WITH GFR
Anion gap: 14 (ref 5–15)
BUN: 12 mg/dL (ref 6–20)
CO2: 19 mmol/L — ABNORMAL LOW (ref 22–32)
Calcium: 9.6 mg/dL (ref 8.9–10.3)
Chloride: 97 mmol/L — ABNORMAL LOW (ref 98–111)
Creatinine, Ser: 0.68 mg/dL (ref 0.44–1.00)
GFR, Estimated: >60 mL/min (ref >60)
Glucose, Bld: 178 mg/dL — ABNORMAL HIGH (ref 70–99)
Potassium: 4.1 mmol/L (ref 3.5–5.1)
Sodium: 130 mmol/L — ABNORMAL LOW (ref 135–145)

## 2024-07-10 LAB — CBC
HCT: 38.1 % (ref 36.0–46.0)
Hemoglobin: 13 g/dL (ref 12.0–15.0)
MCH: 29.9 pg (ref 26.0–34.0)
MCHC: 34.1 g/dL (ref 30.0–36.0)
MCV: 87.6 fL (ref 80.0–100.0)
Platelets: 337 K/uL (ref 150–400)
RBC: 4.35 MIL/uL (ref 3.87–5.11)
RDW: 14 % (ref 11.5–15.5)
WBC: 6.5 K/uL (ref 4.0–10.5)
nRBC: 0 % (ref 0.0–0.2)

## 2024-07-10 LAB — GLUCOSE, POCT (MANUAL RESULT ENTRY): POCT Glucose (KUC): 166 mg/dL — AB (ref 70–99)

## 2024-07-10 MED ORDER — ONDANSETRON 4 MG PO TBDP: 4.0000 mg | ORAL_TABLET | Freq: Three times a day (TID) | ORAL | 0 refills | Status: DC | PRN

## 2024-07-10 NOTE — ED Triage Notes (Signed)
 Pt c/o of liquid diarrhea that started yesterday. Patient states that she can't even remember how many times she had BM yesterday. Patient continues with liquid diarrhea today x 3, ranging from small-moderate volume. Patient denies any nausea or vomiting. Patient denies any blood in the stool. Denies abd pain. NAD. Steady gait. Patient is able to tolerate oral intake. Patient has not tried any medications for symptoms.
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
## 2024-07-10 NOTE — Discharge Instructions (Addendum)
 Your evaluation suggests that your symptoms are most likely due to viral stomach illness (gastroenteritis/"stomach bug") which will improve on its own with rest and fluids in the next few days.   Take zofran to help with nausea every 8 hours as needed. You may use over the counter medicines for aches and pains such as tylenol as needed.  Start sipping on liquids (broth, water, gatorade, etc). If you are able to keep liquids down without vomiting for 1-2 hours, you may eat bland foods like jello, pudding, applesauce, bananas, rice, and Mings toast. Once you can tolerate blands, you may return to normal diet.   Pedialyte or gatorolyte may help to prevent/fix dehydration due to vomiting and diarrhea.  Please follow up with your primary care provider for further management. Return if you experience worsening or uncontrolled pain, inability to tolerate fluids by mouth, difficulty breathing, fevers 100.13F or greater, recurrent vomiting, or any other concerning symptoms.

## 2024-07-11 ENCOUNTER — Ambulatory Visit (HOSPITAL_COMMUNITY): Payer: Self-pay

## 2024-07-28 ENCOUNTER — Ambulatory Visit: Payer: Self-pay

## 2024-07-28 NOTE — Telephone Encounter (Signed)
" ° °  Copied from CRM #[id number]. Topic: Clinical - Medication Question >> Jul 28, 2024  1:18 PM Ahlexyia S wrote: Reason for CRM: Pt called in wanting to know if medications insulin  glargine (LANTUS ) 100 UNIT/ML Solostar Pen and insulin  lispro (HUMALOG  KWIKPEN) 100 UNIT/ML KwikPen can be filled. Pt is not established but does have an appointment scheduled for 02/24 with [facility]. Pt stated she is currently out of both medications. Pt is requesting a callback regarding this.   Pt also mentioned if it can be filled she would like for it to go to [facility] on [address] [place] for Disposition  Caller has medicine question only, adult not sick, AND triager answers question  Answer Assessment - Initial Assessment Questions Advised patient we cannot refill medications until she has her establishing care visit and will need to contact the prescribing physician for refills. Patient verbalized understanding   1. NAME of MEDICINE: What medicine(s) are you calling about?     sulin glargine (LANTUS ) 100 UNIT/ML Solostar Pen and insulin  lispro (HUMALOG  KWIKPEN) 100 UNIT/ML KwikPen 2. QUESTION: What is your question? (e.g., double dose of medicine, side effect)     Can these meds be filled prior to new patient appointment in feb  3. PRESCRIBER: Who prescribed the medicine? Reason: if prescribed by specialist, call should be referred to that group.     Current PCP, Gonfa, Taye  4. SYMPTOMS: Do you have any symptoms? If Yes, ask: What symptoms are you having?  How bad are the symptoms (e.g., mild, moderate, severe)     N/a  Protocols used: Medication Question Call-A-AH  "

## 2024-08-06 ENCOUNTER — Emergency Department (HOSPITAL_COMMUNITY)

## 2024-08-06 ENCOUNTER — Other Ambulatory Visit: Payer: Self-pay

## 2024-08-06 ENCOUNTER — Encounter (HOSPITAL_COMMUNITY): Payer: Self-pay

## 2024-08-06 ENCOUNTER — Inpatient Hospital Stay (HOSPITAL_COMMUNITY)
Admission: EM | Admit: 2024-08-06 | Discharge: 2024-08-08 | DRG: 074 | Disposition: A | Discharge status: Home / Self Care | Attending: Internal Medicine | Admitting: Internal Medicine

## 2024-08-06 DIAGNOSIS — F1290 Cannabis use, unspecified, uncomplicated: Secondary | ICD-10-CM | POA: Diagnosis present

## 2024-08-06 DIAGNOSIS — R1116 Cannabis hyperemesis syndrome: Secondary | ICD-10-CM | POA: Diagnosis present

## 2024-08-06 DIAGNOSIS — R112 Nausea with vomiting, unspecified: Secondary | ICD-10-CM | POA: Diagnosis not present

## 2024-08-06 DIAGNOSIS — Z833 Family history of diabetes mellitus: Secondary | ICD-10-CM

## 2024-08-06 DIAGNOSIS — Z59819 Housing instability, housed unspecified: Secondary | ICD-10-CM

## 2024-08-06 DIAGNOSIS — Z91419 Personal history of unspecified adult abuse: Secondary | ICD-10-CM

## 2024-08-06 DIAGNOSIS — Z79899 Other long term (current) drug therapy: Secondary | ICD-10-CM

## 2024-08-06 DIAGNOSIS — E1110 Type 2 diabetes mellitus with ketoacidosis without coma: Secondary | ICD-10-CM

## 2024-08-06 DIAGNOSIS — S32049A Unspecified fracture of fourth lumbar vertebra, initial encounter for closed fracture: Secondary | ICD-10-CM | POA: Diagnosis present

## 2024-08-06 DIAGNOSIS — N179 Acute kidney failure, unspecified: Secondary | ICD-10-CM | POA: Diagnosis present

## 2024-08-06 DIAGNOSIS — D72829 Elevated white blood cell count, unspecified: Secondary | ICD-10-CM | POA: Diagnosis present

## 2024-08-06 DIAGNOSIS — E869 Volume depletion, unspecified: Secondary | ICD-10-CM | POA: Diagnosis present

## 2024-08-06 DIAGNOSIS — S32039A Unspecified fracture of third lumbar vertebra, initial encounter for closed fracture: Secondary | ICD-10-CM | POA: Diagnosis present

## 2024-08-06 DIAGNOSIS — Z8249 Family history of ischemic heart disease and other diseases of the circulatory system: Secondary | ICD-10-CM

## 2024-08-06 DIAGNOSIS — E1065 Type 1 diabetes mellitus with hyperglycemia: Secondary | ICD-10-CM | POA: Diagnosis present

## 2024-08-06 DIAGNOSIS — Z823 Family history of stroke: Secondary | ICD-10-CM

## 2024-08-06 DIAGNOSIS — Z794 Long term (current) use of insulin: Secondary | ICD-10-CM

## 2024-08-06 DIAGNOSIS — E860 Dehydration: Secondary | ICD-10-CM | POA: Diagnosis present

## 2024-08-06 DIAGNOSIS — F32A Depression, unspecified: Secondary | ICD-10-CM | POA: Diagnosis present

## 2024-08-06 DIAGNOSIS — E1043 Type 1 diabetes mellitus with diabetic autonomic (poly)neuropathy: Principal | ICD-10-CM | POA: Diagnosis present

## 2024-08-06 DIAGNOSIS — Z83438 Family history of other disorder of lipoprotein metabolism and other lipidemia: Secondary | ICD-10-CM

## 2024-08-06 DIAGNOSIS — Z87891 Personal history of nicotine dependence: Secondary | ICD-10-CM

## 2024-08-06 DIAGNOSIS — E872 Acidosis, unspecified: Secondary | ICD-10-CM | POA: Diagnosis present

## 2024-08-06 DIAGNOSIS — W19XXXA Unspecified fall, initial encounter: Secondary | ICD-10-CM | POA: Diagnosis present

## 2024-08-06 DIAGNOSIS — E10649 Type 1 diabetes mellitus with hypoglycemia without coma: Secondary | ICD-10-CM | POA: Diagnosis not present

## 2024-08-06 DIAGNOSIS — E878 Other disorders of electrolyte and fluid balance, not elsewhere classified: Secondary | ICD-10-CM

## 2024-08-06 DIAGNOSIS — E0810 Diabetes mellitus due to underlying condition with ketoacidosis without coma: Secondary | ICD-10-CM

## 2024-08-06 DIAGNOSIS — K3184 Gastroparesis: Secondary | ICD-10-CM | POA: Diagnosis present

## 2024-08-06 DIAGNOSIS — Z820 Family history of epilepsy and other diseases of the nervous system: Secondary | ICD-10-CM

## 2024-08-06 DIAGNOSIS — E109 Type 1 diabetes mellitus without complications: Secondary | ICD-10-CM | POA: Diagnosis present

## 2024-08-06 DIAGNOSIS — Z8419 Family history of other disorders of kidney and ureter: Secondary | ICD-10-CM

## 2024-08-06 DIAGNOSIS — F419 Anxiety disorder, unspecified: Secondary | ICD-10-CM | POA: Diagnosis present

## 2024-08-06 DIAGNOSIS — R739 Hyperglycemia, unspecified: Principal | ICD-10-CM

## 2024-08-06 LAB — BASIC METABOLIC PANEL WITH GFR
Anion gap: 25 — ABNORMAL HIGH (ref 5–15)
BUN: 16 mg/dL (ref 6–20)
CO2: 18 mmol/L — ABNORMAL LOW (ref 22–32)
Calcium: 11.1 mg/dL — ABNORMAL HIGH (ref 8.9–10.3)
Chloride: 89 mmol/L — ABNORMAL LOW (ref 98–111)
Creatinine, Ser: 1.06 mg/dL — ABNORMAL HIGH (ref 0.44–1.00)
GFR, Estimated: >60 mL/min
Glucose, Bld: 267 mg/dL — ABNORMAL HIGH (ref 70–99)
Potassium: 4.4 mmol/L (ref 3.5–5.1)
Sodium: 132 mmol/L — ABNORMAL LOW (ref 135–145)

## 2024-08-06 LAB — RESP PANEL BY RT-PCR (RSV, FLU A&B, COVID)  RVPGX2
Influenza A by PCR: NEGATIVE
Influenza B by PCR: NEGATIVE
Resp Syncytial Virus by PCR: NEGATIVE
SARS Coronavirus 2 by RT PCR: NEGATIVE

## 2024-08-06 LAB — CBG MONITORING, ED
Glucose-Capillary: 163 mg/dL — ABNORMAL HIGH (ref 70–99)
Glucose-Capillary: 268 mg/dL — ABNORMAL HIGH (ref 70–99)
Glucose-Capillary: 36 mg/dL — CL (ref 70–99)

## 2024-08-06 LAB — CBC
HCT: 44.3 % (ref 36.0–46.0)
Hemoglobin: 15.5 g/dL — ABNORMAL HIGH (ref 12.0–15.0)
MCH: 29.5 pg (ref 26.0–34.0)
MCHC: 35 g/dL (ref 30.0–36.0)
MCV: 84.2 fL (ref 80.0–100.0)
Platelets: 413 K/uL — ABNORMAL HIGH (ref 150–400)
RBC: 5.26 MIL/uL — ABNORMAL HIGH (ref 3.87–5.11)
RDW: 13.2 % (ref 11.5–15.5)
WBC: 20.3 K/uL — ABNORMAL HIGH (ref 4.0–10.5)
nRBC: 0 % (ref 0.0–0.2)

## 2024-08-06 LAB — I-STAT CHEM 8, ED
BUN: 20 mg/dL (ref 6–20)
Calcium, Ion: 1.06 mmol/L — ABNORMAL LOW (ref 1.15–1.40)
Chloride: 95 mmol/L — ABNORMAL LOW (ref 98–111)
Creatinine, Ser: 0.8 mg/dL (ref 0.44–1.00)
Glucose, Bld: 278 mg/dL — ABNORMAL HIGH (ref 70–99)
HCT: 49 % — ABNORMAL HIGH (ref 36.0–46.0)
Hemoglobin: 16.7 g/dL — ABNORMAL HIGH (ref 12.0–15.0)
Potassium: 3.5 mmol/L (ref 3.5–5.1)
Sodium: 132 mmol/L — ABNORMAL LOW (ref 135–145)
TCO2: 23 mmol/L (ref 22–32)

## 2024-08-06 LAB — I-STAT VENOUS BLOOD GAS, ED
Acid-Base Excess: 2 mmol/L (ref 0.0–2.0)
Bicarbonate: 24.3 mmol/L (ref 20.0–28.0)
Calcium, Ion: 1.07 mmol/L — ABNORMAL LOW (ref 1.15–1.40)
HCT: 49 % — ABNORMAL HIGH (ref 36.0–46.0)
Hemoglobin: 16.7 g/dL — ABNORMAL HIGH (ref 12.0–15.0)
O2 Saturation: 74 %
Potassium: 3.5 mmol/L (ref 3.5–5.1)
Sodium: 131 mmol/L — ABNORMAL LOW (ref 135–145)
TCO2: 25 mmol/L (ref 22–32)
pCO2, Ven: 32.1 mmHg — ABNORMAL LOW (ref 44–60)
pH, Ven: 7.487 — ABNORMAL HIGH (ref 7.25–7.43)
pO2, Ven: 35 mmHg (ref 32–45)

## 2024-08-06 LAB — HCG, SERUM, QUALITATIVE: Preg, Serum: NEGATIVE

## 2024-08-06 LAB — I-STAT CG4 LACTIC ACID, ED
Lactic Acid, Venous: 1.8 mmol/L (ref 0.5–1.9)
Lactic Acid, Venous: 2.6 mmol/L (ref 0.5–1.9)

## 2024-08-06 LAB — BETA-HYDROXYBUTYRIC ACID: Beta-Hydroxybutyric Acid: 2.1 mmol/L — ABNORMAL HIGH (ref 0.05–0.27)

## 2024-08-06 MED ORDER — DEXTROSE 50 % IV SOLN
50.0000 mL | Freq: Once | INTRAVENOUS | Status: AC
  Administered 2024-08-06: 50 mL via INTRAVENOUS

## 2024-08-06 MED ORDER — LACTATED RINGERS IV BOLUS
1000.0000 mL | Freq: Once | INTRAVENOUS | Status: AC
  Administered 2024-08-06: 1000 mL via INTRAVENOUS

## 2024-08-06 MED ORDER — ONDANSETRON HCL 4 MG/2ML IJ SOLN
4.0000 mg | Freq: Four times a day (QID) | INTRAMUSCULAR | Status: DC | PRN
  Administered 2024-08-07: 4 mg via INTRAVENOUS
  Filled 2024-08-06: qty 2

## 2024-08-06 MED ORDER — DEXTROSE 50 % IV SOLN
INTRAVENOUS | Status: AC
  Filled 2024-08-06: qty 50

## 2024-08-06 MED ORDER — SODIUM CHLORIDE 0.9 % IV BOLUS: 1000.0000 mL | Freq: Once | INTRAVENOUS | Status: DC

## 2024-08-06 MED ORDER — METOCLOPRAMIDE HCL 5 MG/ML IJ SOLN
10.0000 mg | Freq: Four times a day (QID) | INTRAMUSCULAR | Status: DC
  Administered 2024-08-07 – 2024-08-08 (×6): 10 mg via INTRAVENOUS
  Filled 2024-08-06 (×6): qty 2

## 2024-08-06 MED ORDER — ONDANSETRON 4 MG PO TBDP
4.0000 mg | ORAL_TABLET | Freq: Once | ORAL | Status: AC
  Administered 2024-08-06: 4 mg via ORAL
  Filled 2024-08-06: qty 1

## 2024-08-06 MED ORDER — ENOXAPARIN SODIUM 40 MG/0.4ML IJ SOSY
40.0000 mg | PREFILLED_SYRINGE | INTRAMUSCULAR | Status: DC
  Administered 2024-08-07: 40 mg via SUBCUTANEOUS
  Filled 2024-08-06: qty 0.4

## 2024-08-06 MED ORDER — MORPHINE SULFATE (PF) 4 MG/ML IV SOLN
4.0000 mg | Freq: Once | INTRAVENOUS | Status: AC
  Administered 2024-08-06: 4 mg via INTRAVENOUS
  Filled 2024-08-06: qty 1

## 2024-08-06 MED ORDER — INSULIN ASPART 100 UNIT/ML IJ SOLN
0.0000 [IU] | INTRAMUSCULAR | Status: DC
  Administered 2024-08-07: 2 [IU] via SUBCUTANEOUS
  Filled 2024-08-06: qty 2

## 2024-08-06 MED ORDER — LACTATED RINGERS IV SOLN: INTRAVENOUS | Status: DC

## 2024-08-06 MED ORDER — ONDANSETRON HCL 4 MG/2ML IJ SOLN
4.0000 mg | Freq: Once | INTRAMUSCULAR | Status: AC
  Administered 2024-08-06: 4 mg via INTRAVENOUS
  Filled 2024-08-06: qty 2

## 2024-08-06 MED ORDER — HYDROXYZINE HCL 25 MG PO TABS
25.0000 mg | ORAL_TABLET | Freq: Three times a day (TID) | ORAL | Status: DC | PRN
  Administered 2024-08-07 (×2): 25 mg via ORAL
  Filled 2024-08-06 (×2): qty 1

## 2024-08-06 MED ORDER — METOCLOPRAMIDE HCL 5 MG/ML IJ SOLN
10.0000 mg | Freq: Once | INTRAMUSCULAR | Status: AC
  Administered 2024-08-06: 10 mg via INTRAVENOUS
  Filled 2024-08-06: qty 2

## 2024-08-06 MED ORDER — ONDANSETRON HCL 4 MG/2ML IJ SOLN: 4.0000 mg | Freq: Once | INTRAMUSCULAR | Status: DC

## 2024-08-06 MED ORDER — INSULIN GLARGINE-YFGN 100 UNIT/ML ~~LOC~~ SOLN
8.0000 [IU] | Freq: Every day | SUBCUTANEOUS | Status: DC
  Filled 2024-08-06: qty 0.08

## 2024-08-06 MED ORDER — IOHEXOL 350 MG/ML SOLN
75.0000 mL | Freq: Once | INTRAVENOUS | Status: AC | PRN
  Administered 2024-08-06: 75 mL via INTRAVENOUS

## 2024-08-06 MED ORDER — ACETAMINOPHEN 650 MG RE SUPP: 650.0000 mg | Freq: Four times a day (QID) | RECTAL | Status: DC | PRN

## 2024-08-06 MED ORDER — ACETAMINOPHEN 325 MG PO TABS: 650.0000 mg | ORAL_TABLET | Freq: Four times a day (QID) | ORAL | Status: DC | PRN

## 2024-08-06 MED ORDER — ONDANSETRON HCL 4 MG PO TABS: 4.0000 mg | ORAL_TABLET | Freq: Four times a day (QID) | ORAL | Status: DC | PRN

## 2024-08-06 NOTE — ED Notes (Signed)
 Attempted x2 for blood cultures. Unsuccessful, PA notified.
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
## 2024-08-06 NOTE — ED Notes (Signed)
 I tried to place an IV on pt, but was unsuccessful.

## 2024-08-06 NOTE — Progress Notes (Addendum)
 CSW received call from nurse requesting assistance; patient acutely sick, in pain, vomiting, tearful, trying to watch 29 yo daughter Sabrina Sutton. CSW met with patient at bedside. Collaborated with patient on calling their mom, Sabrina Sutton [phone]). Patient without concerns letting mom assume care of Sabrina. Sabrina stated she is on the way to the ED to pick her up. Patient remains tearful, verbalizing guilt. Support/affirmation provided. CSW will follow up shortly to support at bedside.   9:09 - Sabrina Sutton came and assumed custody of Sabrina. CSW continued to to support patient.

## 2024-08-06 NOTE — Hospital Course (Signed)
 Sabrina Sutton is a 29 y.o. female with medical history significant for T1DM, depression/anxiety, marijuana use who is admitted with intractable nausea and vomiting.

## 2024-08-06 NOTE — ED Provider Triage Note (Signed)
 Emergency Medicine Provider Triage Evaluation Note  Trystyn Sitts , a 29 y.o. female  was evaluated in triage.  Pt complains of nausea, vomiting.  Patient out of her short acting insulin  but she has been taking her long-acting.  Review of Systems  Positive: Nausea, vomiting Negative: Fever  Physical Exam  BP 108/70 (BP Location: Right Arm)   Pulse (!) 124   Temp 98.6 F (37 C) (Oral)   Resp 16   LMP 06/09/2024 (Approximate)   SpO2 100%  Gen:   Awake, no distress uncomfortable appearing thin adult female Resp:  Normal effort tachypneic MSK:   Moves extremities without difficulty no obvious deformity Other:  Patient here with small child  Medical Decision Making  Medically screening exam initiated at 3:55 PM.  Appropriate orders placed.  Marquelle Baris was informed that the remainder of the evaluation will be completed by another provider, this initial triage assessment does not replace that evaluation, and the importance of remaining in the ED until their evaluation is complete.   Garrick Charleston, MD 08/06/24 1556

## 2024-08-06 NOTE — ED Triage Notes (Signed)
 Pt arrived via [facility] from home for hyperglycemia, N/Vx3d. Pt has been out of short acting insulinx2-3wks. Pt is actively vomiting in EMS triage.
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
## 2024-08-06 NOTE — ED Notes (Signed)
 Pt given orange juice to drink ?

## 2024-08-06 NOTE — ED Notes (Signed)
 Phlebotomy at bedside attempting to collect blood cultures

## 2024-08-07 DIAGNOSIS — E10649 Type 1 diabetes mellitus with hypoglycemia without coma: Secondary | ICD-10-CM | POA: Diagnosis not present

## 2024-08-07 DIAGNOSIS — Z87891 Personal history of nicotine dependence: Secondary | ICD-10-CM | POA: Diagnosis not present

## 2024-08-07 DIAGNOSIS — S32039A Unspecified fracture of third lumbar vertebra, initial encounter for closed fracture: Secondary | ICD-10-CM | POA: Diagnosis present

## 2024-08-07 DIAGNOSIS — E860 Dehydration: Secondary | ICD-10-CM | POA: Diagnosis present

## 2024-08-07 DIAGNOSIS — E872 Acidosis, unspecified: Secondary | ICD-10-CM | POA: Diagnosis present

## 2024-08-07 DIAGNOSIS — F1290 Cannabis use, unspecified, uncomplicated: Secondary | ICD-10-CM | POA: Diagnosis present

## 2024-08-07 DIAGNOSIS — R739 Hyperglycemia, unspecified: Secondary | ICD-10-CM | POA: Diagnosis present

## 2024-08-07 DIAGNOSIS — E1043 Type 1 diabetes mellitus with diabetic autonomic (poly)neuropathy: Secondary | ICD-10-CM | POA: Diagnosis present

## 2024-08-07 DIAGNOSIS — Z833 Family history of diabetes mellitus: Secondary | ICD-10-CM | POA: Diagnosis not present

## 2024-08-07 DIAGNOSIS — N179 Acute kidney failure, unspecified: Secondary | ICD-10-CM | POA: Diagnosis present

## 2024-08-07 DIAGNOSIS — F419 Anxiety disorder, unspecified: Secondary | ICD-10-CM | POA: Diagnosis present

## 2024-08-07 DIAGNOSIS — Z8419 Family history of other disorders of kidney and ureter: Secondary | ICD-10-CM | POA: Diagnosis not present

## 2024-08-07 DIAGNOSIS — R1116 Cannabis hyperemesis syndrome: Secondary | ICD-10-CM | POA: Diagnosis present

## 2024-08-07 DIAGNOSIS — W19XXXA Unspecified fall, initial encounter: Secondary | ICD-10-CM | POA: Diagnosis present

## 2024-08-07 DIAGNOSIS — Z8249 Family history of ischemic heart disease and other diseases of the circulatory system: Secondary | ICD-10-CM | POA: Diagnosis not present

## 2024-08-07 DIAGNOSIS — Z79899 Other long term (current) drug therapy: Secondary | ICD-10-CM | POA: Diagnosis not present

## 2024-08-07 DIAGNOSIS — Z59819 Housing instability, housed unspecified: Secondary | ICD-10-CM | POA: Diagnosis not present

## 2024-08-07 DIAGNOSIS — E1065 Type 1 diabetes mellitus with hyperglycemia: Secondary | ICD-10-CM | POA: Diagnosis present

## 2024-08-07 DIAGNOSIS — Z794 Long term (current) use of insulin: Secondary | ICD-10-CM | POA: Diagnosis not present

## 2024-08-07 DIAGNOSIS — K3184 Gastroparesis: Secondary | ICD-10-CM | POA: Diagnosis present

## 2024-08-07 DIAGNOSIS — F32A Depression, unspecified: Secondary | ICD-10-CM | POA: Diagnosis present

## 2024-08-07 DIAGNOSIS — S32049A Unspecified fracture of fourth lumbar vertebra, initial encounter for closed fracture: Secondary | ICD-10-CM | POA: Diagnosis present

## 2024-08-07 DIAGNOSIS — Z820 Family history of epilepsy and other diseases of the nervous system: Secondary | ICD-10-CM | POA: Diagnosis not present

## 2024-08-07 DIAGNOSIS — D72829 Elevated white blood cell count, unspecified: Secondary | ICD-10-CM | POA: Diagnosis present

## 2024-08-07 DIAGNOSIS — Z83438 Family history of other disorder of lipoprotein metabolism and other lipidemia: Secondary | ICD-10-CM | POA: Diagnosis not present

## 2024-08-07 LAB — COMPREHENSIVE METABOLIC PANEL WITH GFR
ALT: 22 U/L (ref 0–44)
AST: 22 U/L (ref 15–41)
Albumin: 4 g/dL (ref 3.5–5.0)
Alkaline Phosphatase: 92 U/L (ref 38–126)
Anion gap: 11 (ref 5–15)
BUN: 12 mg/dL (ref 6–20)
CO2: 27 mmol/L (ref 22–32)
Calcium: 9.2 mg/dL (ref 8.9–10.3)
Chloride: 98 mmol/L (ref 98–111)
Creatinine, Ser: 0.75 mg/dL (ref 0.44–1.00)
GFR, Estimated: >60 mL/min
Glucose, Bld: 125 mg/dL — ABNORMAL HIGH (ref 70–99)
Potassium: 3.1 mmol/L — ABNORMAL LOW (ref 3.5–5.1)
Sodium: 136 mmol/L (ref 135–145)
Total Bilirubin: 0.8 mg/dL (ref 0.0–1.2)
Total Protein: 6.6 g/dL (ref 6.5–8.1)

## 2024-08-07 LAB — CBC
HCT: 36 % (ref 36.0–46.0)
Hemoglobin: 12.9 g/dL (ref 12.0–15.0)
MCH: 29.5 pg (ref 26.0–34.0)
MCHC: 35.8 g/dL (ref 30.0–36.0)
MCV: 82.2 fL (ref 80.0–100.0)
Platelets: 322 K/uL (ref 150–400)
RBC: 4.38 MIL/uL (ref 3.87–5.11)
RDW: 13 % (ref 11.5–15.5)
WBC: 17 K/uL — ABNORMAL HIGH (ref 4.0–10.5)
nRBC: 0 % (ref 0.0–0.2)

## 2024-08-07 LAB — GLUCOSE, CAPILLARY
Glucose-Capillary: 108 mg/dL — ABNORMAL HIGH (ref 70–99)
Glucose-Capillary: 160 mg/dL — ABNORMAL HIGH (ref 70–99)
Glucose-Capillary: 179 mg/dL — ABNORMAL HIGH (ref 70–99)
Glucose-Capillary: 193 mg/dL — ABNORMAL HIGH (ref 70–99)
Glucose-Capillary: 223 mg/dL — ABNORMAL HIGH (ref 70–99)
Glucose-Capillary: 270 mg/dL — ABNORMAL HIGH (ref 70–99)
Glucose-Capillary: 379 mg/dL — ABNORMAL HIGH (ref 70–99)
Glucose-Capillary: 43 mg/dL — CL (ref 70–99)
Glucose-Capillary: 60 mg/dL — ABNORMAL LOW (ref 70–99)
Glucose-Capillary: 96 mg/dL (ref 70–99)

## 2024-08-07 LAB — URINALYSIS, ROUTINE W REFLEX MICROSCOPIC
Bacteria, UA: NONE SEEN
Bilirubin Urine: NEGATIVE
Glucose, UA: >=500 mg/dL — AB
Hgb urine dipstick: NEGATIVE
Ketones, ur: 20 mg/dL — AB
Nitrite: NEGATIVE
Protein, ur: 30 mg/dL — AB
Specific Gravity, Urine: 1.051 — ABNORMAL HIGH (ref 1.005–1.030)
pH: 6 (ref 5.0–8.0)

## 2024-08-07 LAB — URINE DRUG SCREEN
Amphetamines: NEGATIVE
Barbiturates: NEGATIVE
Benzodiazepines: NEGATIVE
Cocaine: NEGATIVE
Fentanyl: NEGATIVE
Methadone Scn, Ur: NEGATIVE
Opiates: POSITIVE — AB
Tetrahydrocannabinol: POSITIVE — AB

## 2024-08-07 LAB — CBG MONITORING, ED: Glucose-Capillary: 246 mg/dL — ABNORMAL HIGH (ref 70–99)

## 2024-08-07 LAB — MAGNESIUM: Magnesium: 1.7 mg/dL (ref 1.7–2.4)

## 2024-08-07 LAB — LACTIC ACID, PLASMA: Lactic Acid, Venous: 1.2 mmol/L (ref 0.5–1.9)

## 2024-08-07 MED ORDER — POTASSIUM CHLORIDE 10 MEQ/100ML IV SOLN
10.0000 meq | INTRAVENOUS | Status: AC
  Administered 2024-08-07 (×3): 10 meq via INTRAVENOUS
  Filled 2024-08-07 (×3): qty 100

## 2024-08-07 MED ORDER — LACTATED RINGERS IV SOLN: INTRAVENOUS | Status: DC

## 2024-08-07 MED ORDER — INSULIN GLARGINE 100 UNIT/ML ~~LOC~~ SOLN
6.0000 [IU] | Freq: Every day | SUBCUTANEOUS | Status: DC
  Filled 2024-08-07: qty 0.06

## 2024-08-07 MED ORDER — INSULIN ASPART 100 UNIT/ML IJ SOLN
0.0000 [IU] | INTRAMUSCULAR | Status: DC
  Administered 2024-08-07: 5 [IU] via SUBCUTANEOUS
  Administered 2024-08-08: 3 [IU] via SUBCUTANEOUS
  Administered 2024-08-08: 1 [IU] via SUBCUTANEOUS
  Filled 2024-08-07: qty 2
  Filled 2024-08-07: qty 5
  Filled 2024-08-07: qty 3

## 2024-08-07 MED ORDER — DEXTROSE 50 % IV SOLN: 25.0000 g | INTRAVENOUS | Status: DC

## 2024-08-07 MED ORDER — DEXTROSE-SODIUM CHLORIDE 5-0.45 % IV SOLN: INTRAVENOUS | Status: DC

## 2024-08-07 MED ORDER — DEXTROSE 50 % IV SOLN
INTRAVENOUS | Status: AC
  Filled 2024-08-07: qty 50

## 2024-08-07 NOTE — Progress Notes (Signed)
 New Admission Note:    Arrival Method: ED stretcher Mental Orientation: AAOx4 Telemetry:NA Assessment: Completed Skin: See flowsheet IV: RFA Pain: 0/10 Tubes: n/a Safety Measures: Safety Fall Prevention Plan has been given, discussed and signed Admission: Completed [facility] Orientation: Patient has been orientated to the room, unit and staff.  Family: none at bedside   Orders have been reviewed and implemented. Will continue to monitor the patient. Call light has been placed within reach and bed alarm has been activated.

## 2024-08-07 NOTE — ED Notes (Signed)
 28M given courtesy call

## 2024-08-07 NOTE — Inpatient Diabetes Management (Signed)
 Inpatient Diabetes Program Recommendations  AACE/ADA: New Consensus Statement on Inpatient Glycemic Control (2015)  Target Ranges:  Prepandial:   less than 140 mg/dL      Peak postprandial:   less than 180 mg/dL (1-2 hours)      Critically ill patients:  140 - 180 mg/dL   Lab Results  Component Value Date   GLUCAP 108 (H) 08/07/2024   HGBA1C 9.7 (H) 05/17/2024    Review of Glycemic Control  Latest Reference Range & Units 08/07/24 04:16 08/07/24 08:14 08/07/24 11:25 08/07/24 11:55 08/07/24 12:14  Glucose-Capillary 70 - 99 mg/dL 96 839 (H) 60 (L) 43 (LL) 108 (H)   Diabetes history: Type 1 DM Outpatient Diabetes medications: Lantus  12 units every day, Humalog  4 units TID (out of prescription 2-wks) Current orders for Inpatient glycemic control: Novolog  0-9 units Q4H, Lantus  6 units QD  Inpatient Diabetes Program Recommendations:    Noted hypoglycemia of 43 mg/dL following 2 units of Novolog . Consider discontinuining Lantus  and changing correction to Novolog  0-6 units Q4H. Patient is sensitive to insulin .   Spoke with patient at bedside following hypoglycemia episode. Patient verified last dose of Lantus  outpatient was yesterday and reports that she has struggled to get her prescription of short acting because her primary dropped her as a patient for no show appointments. Has an appointment scheduled with new PCP for next month. Reviewed patient's current A1c of 9.7%. Explained what a A1c is and what it measures. Also reviewed goal A1c with patient, importance of good glucose control @ home, and blood sugar goals. Discussed with patient [employer] products to include Regular insulin  and Medicaid's transportation service.  No further questions at this time. Secure chat sent to Dr Davia.  Thanks, Tinnie Minus, MSN, RNC-OB Diabetes Coordinator [phone] (8a-5p)

## 2024-08-07 NOTE — Progress Notes (Addendum)
 "          [facility]                                                                              Sabrina Sutton, is a 29 y.o. female, DOB - 17-Feb-1996, FMW:[id number] Admit date - 08/06/2024    Outpatient Primary MD for the patient is [name], DO  LOS - 0  days  No chief complaint on file.      Brief summary   Patient is a 29 year old female with diabetes mellitus type 1, depression, anxiety, marijuana use presented to ED with intractable nausea and vomiting.  Patient reported that she ran out of her short acting insulin  about 2 weeks ago. She has had her basal Lantus  12 units daily which she has been taking.  A day before the admission, developed nausea and vomiting and unable to maintain any adequate oral intake.  Reported that her blood sugars were running in the 360s and on the morning of admission, her monitor read blood sugar as high.  She called the EMS and was brought to ED for further evaluation.   Patient reports ongoing marijuana use.  No frequent diarrhea, abdominal pain or dizziness. CT abdomen pelvis negative for acute findings.  Patient was admitted for observation.  Assessment & Plan       Intractable nausea and vomiting -Likely secondary to diabetic gastroparesis versus cannabinoid hyperemesis syndrome - She feels mildly better today, continue IV fluids, IV Reglan  scheduled and IV Zofran  as needed - Currently on full liquid diet If she tolerates for lunch, will advance to soft solids for supper  - Continue IV fluids    Type 1 diabetes (HCC) with hyper and hypoglycemia. -Hemoglobin A1c 9.7 on 05/17/2024 -Outpatient on Lantus  12 units daily and Humalog  4 units 3 times daily with meals, had not been taking short acting insulin  On arrival, serum glucose 267 on arrival, subsequently became hypoglycemic with CBGs 36.   CBG (last 3)  Recent Labs    08/07/24 1125 08/07/24 1155 08/07/24 1214  GLUCAP 60* 43* 108*   Persistently hypoglycemic, hold  basal insulin , placed on D5 half-normal saline. Changed sliding scale insulin  to very sensitive every 4 hours  Hypercalcemia: - Resolved, likely due to dehydration and volume depletion.  - Currently on IV fluid hydration.    Mild AKI with lactic acidosis - Baseline creatinine 0.6-0.7, presented with creatinine of 1.06 with anion gap metabolic acidosis lactic acidosis 2.6 - Resolved, continue gentle hydration, creatinine 0.75, lactic acid improved   Leukocytosis: Likely from hemoconcentration.  No obvious infectious symptoms.  UA with large leukocytes but no bacteria, WBCs 11-20.   -Improving  Marijuana use with possible hyperemesis  - Counseled on marijuana cessation.  Continue management as #1   Depression/anxiety: Continue Atarax  as needed.   L3 and L4 transverse process fractures: CT imaging shows healing right L3 and L4 transverse process fractures, new when compared to imaging in October 2025.  Reported fall about a month ago.  She has not had any back pain or lower extremity weakness.   Estimated body mass index is 20.14 kg/m as calculated from the following:   Height as of this encounter:  5' 6 (1.676 m).   Weight as of this encounter: 56.6 kg.  Code Status: Full code DVT Prophylaxis:  enoxaparin  (LOVENOX ) injection 40 mg Start: 08/07/24 2200   Level of Care: Level of care: Med-Surg Family Communication: Updated patient  Disposition Plan:      Remains inpatient appropriate: Possible DC home in a.m. if tolerating solid diet.   Procedures:  None  Consultants:   None  Antimicrobials:   Anti-infectives (From admission, onward)    None          Medications  enoxaparin  (LOVENOX ) injection  40 mg Subcutaneous Q24H   insulin  aspart  0-9 Units Subcutaneous Q4H   metoCLOPramide  (REGLAN ) injection  10 mg Intravenous Q6H      Subjective:   Sabrina Sutton was seen and examined today.  Curled up in the bed, no active nausea or vomiting.  States did not  eat much.  No acute abdominal pain.  Patient denies dizziness, chest pain, shortness of breath, abdominal pain, fever  Objective:   Vitals:   08/07/24 0147 08/07/24 0418 08/07/24 0429 08/07/24 0811  BP: 134/89 101/65  (!) 137/98  Pulse: (!) 102 71  71  Resp: 17 18  18   Temp: 98.6 F (37 C) 98.5 F (36.9 C)  98.3 F (36.8 C)  TempSrc: Oral Oral  Oral  SpO2: 100% 100%  100%  Weight:   56.6 kg   Height: 5' 6 (1.676 m)       Intake/Output Summary (Last 24 hours) at 08/07/2024 1007 Last data filed at 08/07/2024 0200 Gross per 24 hour  Intake 2491.77 ml  Output --  Net 2491.77 ml     Wt Readings from Last 3 Encounters:  08/07/24 56.6 kg  07/02/24 61.3 kg  05/18/24 57.4 kg     Exam General: Alert and oriented x 3, NAD Cardiovascular: S1 S2 auscultated,  RRR Respiratory: Clear to auscultation bilaterally, no wheezing Gastrointestinal: Soft, nontender, nondistended, + bowel sounds Ext: no pedal edema bilaterally Neuro: No new deficits Psych: Normal affect     Data Reviewed:  I have personally reviewed following labs    CBC Lab Results  Component Value Date   WBC 17.0 (H) 08/07/2024   RBC 4.38 08/07/2024   HGB 12.9 08/07/2024   HCT 36.0 08/07/2024   MCV 82.2 08/07/2024   MCH 29.5 08/07/2024   PLT 322 08/07/2024   MCHC 35.8 08/07/2024   RDW 13.0 08/07/2024   LYMPHSABS 4.0 05/20/2024   MONOABS 0.7 05/20/2024   EOSABS 0.1 05/20/2024   BASOSABS 0.0 05/20/2024     Last metabolic panel Lab Results  Component Value Date   NA 136 08/07/2024   K 3.1 (L) 08/07/2024   CL 98 08/07/2024   CO2 27 08/07/2024   BUN 12 08/07/2024   CREATININE 0.75 08/07/2024   GLUCOSE 125 (H) 08/07/2024   GFRNONAA >60 08/07/2024   GFRAA >60 01/17/2020   CALCIUM  9.2 08/07/2024   PHOS 1.8 (L) 07/03/2024   PROT 6.6 08/07/2024   ALBUMIN 4.0 08/07/2024   LABGLOB 2.4 12/05/2022   AGRATIO 1.8 12/05/2022   BILITOT 0.8 08/07/2024   ALKPHOS 92 08/07/2024   AST 22 08/07/2024   ALT 22  08/07/2024   ANIONGAP 11 08/07/2024    CBG (last 3)  Recent Labs    08/07/24 0152 08/07/24 0416 08/07/24 0814  GLUCAP 193* 96 160*      Coagulation Profile: No results for input(s): INR, PROTIME in the last 168 hours.   Radiology Studies: I  have personally reviewed the imaging studies  CT ABDOMEN PELVIS W CONTRAST Result Date: 08/06/2024 EXAM: CT ABDOMEN AND PELVIS WITH CONTRAST 08/06/2024 09:33:19 PM TECHNIQUE: CT of the abdomen and pelvis was performed with the administration of 75 mL of iohexol  (OMNIPAQUE ) 350 MG/ML injection. Multiplanar reformatted images are provided for review. Automated exposure control, iterative reconstruction, and/or weight-based adjustment of the mA/kV was utilized to reduce the radiation dose to as low as reasonably achievable. COMPARISON: CT Abdomen and Pelvis dated 05/16/2024. CLINICAL HISTORY: Nausea and vomiting with abdominal pain, initial encounter. FINDINGS: LOWER CHEST: No acute abnormality. LIVER: The liver is unremarkable. GALLBLADDER AND BILE DUCTS: Gallbladder is unremarkable. No biliary ductal dilatation. SPLEEN: No acute abnormality. PANCREAS: No acute abnormality. ADRENAL GLANDS: No acute abnormality. KIDNEYS, URETERS AND BLADDER: No stones in the kidneys or ureters. No hydronephrosis. No perinephric or periureteral stranding. Urinary bladder is unremarkable. GI AND BOWEL: The appendix is air-filled and within normal limits. Stomach demonstrates no acute abnormality. There is no bowel obstruction. PERITONEUM AND RETROPERITONEUM: No free fluid is seen. No free air. VASCULATURE: Aorta is normal in caliber. LYMPH NODES: No lymphadenopathy. REPRODUCTIVE ORGANS: An intrauterine device (IUD) is noted within the uterus. Fluid is noted in the endometrial canal, likely related to the patient's current menstrual status. Simple-appearing cysts are noted within the ovaries bilaterally, slightly larger on the left than the right, measuring up to 3.3 cm. BONES  AND SOFT TISSUES: A prior right L3 transverse process fracture with healing is noted. Similar findings are noted at L4 on the right. These changes are new from the prior CT examination. No other bony abnormalities noted. No focal soft tissue abnormality. IMPRESSION: 1. No acute findings in the abdomen or pelvis. 2. Healing right L3 and L4 transverse process fractures, new since 05/16/2024. 3. Fluid in the endometrial canal is likely related to the current menstrual status. 4. Ovarian cysts are seen. No specific follow-up is recommended. Electronically signed by: Oneil Devonshire MD 08/06/2024 09:56 PM EST RP Workstation: [id number] Nydia Distance M.D. [facility] 08/07/2024, 10:07 AM  Available via Epic secure chat 7am-7pm After 7 pm, please refer to night coverage provider listed on amion.    "

## 2024-08-07 NOTE — Progress Notes (Signed)
 NT sent notification that patient sugar was 60 and that she was given apple juice. Follow up sugar check returned a result of 43. Contacted provider for hypoglycemic orders. Patient given 8oz of cola as per order, along with some pudding from lunch tray. Reglan  administered for N/V, fluids changed from LR 125mL to D5 45% NSL @100mL /hr. Active emesis at bedside after reglan  and po intake. Next POCT showed increase from 43 to 108. No further interventions performed. Education for po intake and meal advancement given. Will continue to monitor patient status.

## 2024-08-08 ENCOUNTER — Other Ambulatory Visit (HOSPITAL_COMMUNITY): Payer: Self-pay

## 2024-08-08 LAB — BASIC METABOLIC PANEL WITH GFR
Anion gap: 10 (ref 5–15)
BUN: 8 mg/dL (ref 6–20)
CO2: 25 mmol/L (ref 22–32)
Calcium: 9 mg/dL (ref 8.9–10.3)
Chloride: 98 mmol/L (ref 98–111)
Creatinine, Ser: 0.89 mg/dL (ref 0.44–1.00)
GFR, Estimated: >60 mL/min
Glucose, Bld: 279 mg/dL — ABNORMAL HIGH (ref 70–99)
Potassium: 3.2 mmol/L — ABNORMAL LOW (ref 3.5–5.1)
Sodium: 133 mmol/L — ABNORMAL LOW (ref 135–145)

## 2024-08-08 LAB — HEMOGLOBIN A1C
Hgb A1c MFr Bld: 9.9 % — ABNORMAL HIGH (ref 4.8–5.6)
Mean Plasma Glucose: 237.43 mg/dL

## 2024-08-08 LAB — MAGNESIUM: Magnesium: 1.8 mg/dL (ref 1.7–2.4)

## 2024-08-08 LAB — GLUCOSE, CAPILLARY
Glucose-Capillary: 292 mg/dL — ABNORMAL HIGH (ref 70–99)
Glucose-Capillary: 196 mg/dL — ABNORMAL HIGH (ref 70–99)

## 2024-08-08 MED ORDER — INSULIN PEN NEEDLE 32G X 4 MM MISC
1.0000 | 0 refills | Status: AC
  Filled 2024-08-08: qty 100, 25d supply, fill #0

## 2024-08-08 MED ORDER — PANTOPRAZOLE SODIUM 40 MG PO TBEC
40.0000 mg | DELAYED_RELEASE_TABLET | Freq: Every day | ORAL | 2 refills | Status: AC
  Filled 2024-08-08: qty 30, 30d supply, fill #0

## 2024-08-08 MED ORDER — NOVOLOG FLEXPEN 100 UNIT/ML ~~LOC~~ SOPN
0.0000 [IU] | PEN_INJECTOR | Freq: Three times a day (TID) | SUBCUTANEOUS | 3 refills | Status: AC
  Filled 2024-08-08: qty 15, 84d supply, fill #0

## 2024-08-08 MED ORDER — BLOOD GLUCOSE MONITOR SYSTEM W/DEVICE KIT
1.0000 | PACK | 0 refills | Status: AC
  Filled 2024-08-08: qty 1, 30d supply, fill #0

## 2024-08-08 MED ORDER — LANCETS MISC
1.0000 | 0 refills | Status: AC
  Filled 2024-08-08: qty 100, 25d supply, fill #0

## 2024-08-08 MED ORDER — LANCET DEVICE MISC
1.0000 | 0 refills | Status: AC
  Filled 2024-08-08: qty 1, fill #0

## 2024-08-08 MED ORDER — METOCLOPRAMIDE HCL 5 MG PO TABS
5.0000 mg | ORAL_TABLET | Freq: Three times a day (TID) | ORAL | 0 refills | Status: AC
  Filled 2024-08-08: qty 90, 30d supply, fill #0

## 2024-08-08 MED ORDER — INSULIN ASPART 100 UNIT/ML IJ SOLN: 0.0000 [IU] | Freq: Three times a day (TID) | INTRAMUSCULAR | Status: DC

## 2024-08-08 MED ORDER — BLOOD GLUCOSE TEST VI STRP
1.0000 | ORAL_STRIP | 0 refills | Status: AC
  Filled 2024-08-08: qty 100, 25d supply, fill #0

## 2024-08-08 MED ORDER — INSULIN GLARGINE 100 UNIT/ML SOLOSTAR PEN
10.0000 [IU] | PEN_INJECTOR | Freq: Every day | SUBCUTANEOUS | 3 refills | Status: AC
  Filled 2024-08-08: qty 9, 84d supply, fill #0

## 2024-08-08 MED ORDER — POTASSIUM CHLORIDE 10 MEQ/100ML IV SOLN
10.0000 meq | INTRAVENOUS | Status: AC
  Administered 2024-08-08 (×3): 10 meq via INTRAVENOUS
  Filled 2024-08-08 (×3): qty 100

## 2024-08-08 MED ORDER — INSULIN GLARGINE 100 UNIT/ML ~~LOC~~ SOLN
10.0000 [IU] | Freq: Every day | SUBCUTANEOUS | Status: DC
  Administered 2024-08-08: 10 [IU] via SUBCUTANEOUS
  Filled 2024-08-08: qty 0.1

## 2024-08-08 MED ORDER — ONDANSETRON 4 MG PO TBDP
4.0000 mg | ORAL_TABLET | Freq: Three times a day (TID) | ORAL | 0 refills | Status: AC | PRN
  Filled 2024-08-08: qty 30, 10d supply, fill #0

## 2024-08-08 NOTE — TOC Transition Note (Signed)
 Transition of Care [facility]) - Discharge Note   Patient Details  Name: Sabrina Sutton MRN: [id number] Date of Birth: Oct 05, 1995  [facility]) CM/SW Contact:  Tom-Johnson, Harvest Muskrat, RN Phone Number: 08/08/2024, 10:04 AM   Clinical Narrative:     Patient is scheduled for discharge today.  Readmission Risk Assessment done. New patient establishment info, hospital f/u and discharge instructions on AVS. Prescriptions sent to [facility] pharmacy and patient will receive meds prior discharge. No ICM needs or recommendations noted. Mother, Rexanne will transport at discharge.  No further ICM needs noted.      Final next level of care: Home/Self Care Barriers to Discharge: Barriers Resolved   Patient Goals and CMS Choice Patient states their goals for this hospitalization and ongoing recovery are:: To return to a [employer]. CMS Medicare.gov Compare Post Acute Care list provided to:: Patient        Discharge Placement                Patient to be transferred to facility by: Mother Name of family member notified: Letreile    Discharge Plan and Services Additional resources added to the After Visit Summary for                  DME Arranged: N/A DME Agency: NA       HH Arranged: NA HH Agency: NA        Social Drivers of Health (SDOH) Interventions SDOH Screenings   Food Insecurity: Food Insecurity Present (08/07/2024)  Housing: High Risk (08/07/2024)  Transportation Needs: Patient Declined (08/07/2024)  Utilities: Not At Risk (08/07/2024)  Alcohol  Screen: Low Risk (08/05/2023)  Depression (PHQ2-9): Low Risk (04/28/2024)  Financial Resource Strain: Low Risk (08/08/2024)  Physical Activity: Unknown (08/05/2023)  Social Connections: Moderately Isolated (07/02/2024)  Stress: No Stress Concern Present (08/05/2023)  Tobacco Use: Medium Risk (08/06/2024)     Readmission Risk Interventions    08/08/2024   10:02 AM 05/20/2024   10:30 AM  Readmission Risk  Prevention Plan  Transportation Screening Complete Complete  PCP or Specialist Appt within 5-7 Days Complete Complete  Home Care Screening Complete Complete  Medication Review (RN CM) Referral to Pharmacy Complete

## 2024-08-08 NOTE — Discharge Summary (Signed)
 " Physician Discharge Summary   Patient: Sabrina Sutton MRN: [id number] DOB: 1996/04/20  Admit date:     08/06/2024  Discharge date: 08/08/24  Discharge Physician: Nydia Distance, MD    PCP: [name], DO   Recommendations at discharge:   Continue Lantus  10 units daily, NovoLog  FlexPen with sliding scale insulin , sensitive  Discharge Diagnoses:    Intractable nausea and vomiting,  gastroparesis versus cannabis hyperemesis syndrome   Type 1 diabetes (HCC)   Anxiety   Leukocytosis   Hypercalcemia    Hospital Course:  Patient is a 29 year old female with diabetes mellitus type 1, depression, anxiety, marijuana use presented to ED with intractable nausea and vomiting.  Patient reported that she ran out of her short acting insulin  about 2 weeks ago. She has had her basal Lantus  12 units daily which she has been taking.  A day before the admission, developed nausea and vomiting and unable to maintain any adequate oral intake.  Reported that her blood sugars were running in the 360s and on the morning of admission, her monitor read blood sugar as high.  She called the EMS and was brought to ED for further evaluation.   Patient reports ongoing marijuana use.  No frequent diarrhea, abdominal pain or dizziness. CT abdomen pelvis negative for acute findings.  Assessment and Plan:   Intractable nausea and vomiting -Likely secondary to diabetic gastroparesis versus cannabinoid hyperemesis syndrome - Feels much better today, tolerating solid diet.  Patient was placed on fluids which were discontinued earlier this morning.   - Continue Reglan  and Zofran  as needed, tight glycemic control to help with gastroparesis.  Counseled on THC cessation.     Type 1 diabetes (HCC) with hyper and hypoglycemia. - Hemoglobin A1c 9.9 -Outpatient on Lantus  12 units daily and Humalog  4 units 3 times daily with meals, had not been taking short acting insulin  as she ran out for 2 to 3 weeks -During  hospitalization, patient initially had hypoglycemia due to intractable nausea and vomiting and not eating.  She was placed briefly on D5 half-normal saline. -Now tolerating diet, placed on Lantus  10 units daily and sliding scale insulin  at discharge.  Patient will follow-up with her PCP for further adjustment in the insulin  regimen.  With   Hypercalcemia: - Resolved, likely due to dehydration and volume depletion.  - Received IV fluid hydration.   Mild AKI with lactic acidosis - Baseline creatinine 0.6-0.7, presented with creatinine of 1.06 with anion gap metabolic acidosis lactic acidosis 2.6 - Resolved, 0.8 at discharge     Leukocytosis: Likely from hemoconcentration.  No obvious infectious symptoms.  UA with large leukocytes but no bacteria, WBCs 11-20.   -Improving   Marijuana use with possible hyperemesis  - Counseled on marijuana cessation.   Depression/anxiety: Continue Atarax  as needed.   L3 and L4 transverse process fractures: CT imaging shows healing right L3 and L4 transverse process fractures, new when compared to imaging in October 2025.  Reported fall about a month ago.  She has not had any back pain or lower extremity weakness.     Estimated body mass index is 20.14 kg/m as calculated from the following:   Height as of this encounter: 5' 6 (1.676 m).   Weight as of this encounter: 56.6 kg.         Pain control - [place]  Controlled Substance Reporting System database was reviewed. and patient was instructed, not to drive, operate heavy machinery, perform activities at heights, swimming or participation in water  activities or provide baby-sitting services while on Pain, Sleep and Anxiety Medications; until their outpatient Physician has advised to do so again. Also recommended to not to take more than prescribed Pain, Sleep and Anxiety Medications.  Consultants: Diabetic coordinator  Procedures performed: None Disposition: Home Diet recommendation:   Discharge Diet Orders (From admission, onward)     Start     Ordered   08/08/24 0000  Diet Carb Modified        08/08/24 0948           Carb modified diet DISCHARGE MEDICATION: Allergies as of 08/08/2024   No Known Allergies      Medication List     STOP taking these medications    insulin  lispro 100 UNIT/ML KwikPen Commonly known as: HumaLOG  KwikPen       TAKE these medications    Accu-Chek Guide Me w/Device Kit Use to check blood sugar 4 times daily as directed What changed: Another medication with the same name was added. Make sure you understand how and when to take each.   Blood Glucose Monitor System w/Device Kit Dispense based on patient and insurance preference. Use up to four times daily as directed. (FOR ICD-10 E10.9, E11.9). What changed: You were already taking a medication with the same name, and this prescription was added. Make sure you understand how and when to take each.   Accu-Chek Softclix Lancets lancets Dispense based on patient and insurance preference. Use up to four times daily as directed. (FOR ICD-10 E10.9, E11.9).   acetaminophen  325 MG tablet Commonly known as: TYLENOL  Take 2 tablets (650 mg total) by mouth every 6 (six) hours as needed for mild pain (pain score 1-3) or fever (or Fever >/= 101).   Dexcom G7 Sensor Misc Apply to skin as directed   glucose blood test strip Use as instructed   Accu-Chek Guide Test test strip Generic drug: glucose blood Dispense based on patient and insurance preference. Use up to four times daily as directed. (FOR ICD-10 E10.9, E11.9).   hydrOXYzine  25 MG tablet Commonly known as: ATARAX  Take 1 tablet (25 mg total) by mouth every 8 (eight) hours as needed for anxiety.   Insulin  Aspart FlexPen 100 UNIT/ML Commonly known as: NOVOLOG  Inject 0-6 Units into the skin 3 (three) times daily with meals. If eating and Blood Glucose (BG) 80 or higher inject 0-6 units for meal coverage and add correction  dose per scale. If not eating, correction dose only. BG <150= 0 unit; BG 150-200= 1 unit; BG 201-250= 2 unit; BG 251-300= 3 unit; BG 301-350= 4 unit; BG 351-400= 5 unit; BG >400= 6 unit and Call Primary Care.   Lantus  SoloStar 100 UNIT/ML Solostar Pen Generic drug: insulin  glargine Inject 10 Units into the skin daily. May substitute as needed per insurance. What changed:  how much to take additional instructions   metoCLOPramide  5 MG tablet Commonly known as: Reglan  Take 1 tablet (5 mg total) by mouth 3 (three) times daily before meals.   ondansetron  4 MG disintegrating tablet Commonly known as: ZOFRAN -ODT Take 1 tablet (4 mg total) by mouth every 8 (eight) hours as needed for nausea or vomiting.   OneTouch Delica Plus Lancing Misc 1 each by Does not apply route as directed. Dispense based on patient and insurance preference. Use up to four times daily as directed. (FOR ICD-10 E10.9, E11.9). What changed: Another medication with the same name was added. Make sure you understand how and when to take each.  Lancet Device Misc Dispense based on patient and insurance preference. Use up to four times daily as directed. (FOR ICD-10 E10.9, E11.9). What changed: You were already taking a medication with the same name, and this prescription was added. Make sure you understand how and when to take each.   pantoprazole  40 MG tablet Commonly known as: Protonix  Take 1 tablet (40 mg total) by mouth daily.   TechLite Pen Needles 32G X 4 MM Misc Generic drug: Insulin  Pen Needle Use to inject insulin  daily What changed: Another medication with the same name was changed. Make sure you understand how and when to take each.   TechLite Pen Needles 32G X 4 MM Misc Generic drug: Insulin  Pen Needle Dispense based on patient and insurance preference. Use up to four times daily as directed. (FOR ICD-10 E10.9, E11.9). What changed: medication strength        Follow-up Information     Paseda,  Folashade R, FNP Follow up on 09/16/2024.   Specialty: Nurse Practitioner Why: at 3:20 PM, for hospital follow-up Contact information: [address] [place], [place] [address] [phone]                Discharge Exam: Fredricka Weights   08/07/24 0429  Weight: 56.6 kg   S: Feels really good today, no nausea vomiting, abdominal pain.  Tolerating diet.  Wants to go home today.  BP (!) 123/93   Pulse 75   Temp 98.5 F (36.9 C)   Resp 18   Ht 5' 6 (1.676 m)   Wt 56.6 kg   LMP 06/09/2024 (Approximate)   SpO2 100%   BMI 20.14 kg/m   Physical Exam General: Alert and oriented x 3, NAD Cardiovascular: S1 S2 clear, RRR.  Respiratory: CTAB, no wheezing Gastrointestinal: Soft, nontender, nondistended, NBS Ext: no pedal edema bilaterally Neuro: no new deficits Psych: Normal affect    Condition at discharge: fair  The results of significant diagnostics from this hospitalization (including imaging, microbiology, ancillary and laboratory) are listed below for reference.   Imaging Studies: CT ABDOMEN PELVIS W CONTRAST Result Date: 08/06/2024 EXAM: CT ABDOMEN AND PELVIS WITH CONTRAST 08/06/2024 09:33:19 PM TECHNIQUE: CT of the abdomen and pelvis was performed with the administration of 75 mL of iohexol  (OMNIPAQUE ) 350 MG/ML injection. Multiplanar reformatted images are provided for review. Automated exposure control, iterative reconstruction, and/or weight-based adjustment of the mA/kV was utilized to reduce the radiation dose to as low as reasonably achievable. COMPARISON: CT Abdomen and Pelvis dated 05/16/2024. CLINICAL HISTORY: Nausea and vomiting with abdominal pain, initial encounter. FINDINGS: LOWER CHEST: No acute abnormality. LIVER: The liver is unremarkable. GALLBLADDER AND BILE DUCTS: Gallbladder is unremarkable. No biliary ductal dilatation. SPLEEN: No acute abnormality. PANCREAS: No acute abnormality. ADRENAL GLANDS: No acute abnormality. KIDNEYS, URETERS AND  BLADDER: No stones in the kidneys or ureters. No hydronephrosis. No perinephric or periureteral stranding. Urinary bladder is unremarkable. GI AND BOWEL: The appendix is air-filled and within normal limits. Stomach demonstrates no acute abnormality. There is no bowel obstruction. PERITONEUM AND RETROPERITONEUM: No free fluid is seen. No free air. VASCULATURE: Aorta is normal in caliber. LYMPH NODES: No lymphadenopathy. REPRODUCTIVE ORGANS: An intrauterine device (IUD) is noted within the uterus. Fluid is noted in the endometrial canal, likely related to the patient's current menstrual status. Simple-appearing cysts are noted within the ovaries bilaterally, slightly larger on the left than the right, measuring up to 3.3 cm. BONES AND SOFT TISSUES: A prior right L3 transverse process fracture with  healing is noted. Similar findings are noted at L4 on the right. These changes are new from the prior CT examination. No other bony abnormalities noted. No focal soft tissue abnormality. IMPRESSION: 1. No acute findings in the abdomen or pelvis. 2. Healing right L3 and L4 transverse process fractures, new since 05/16/2024. 3. Fluid in the endometrial canal is likely related to the current menstrual status. 4. Ovarian cysts are seen. No specific follow-up is recommended. Electronically signed by: Oneil Devonshire MD 08/06/2024 09:56 PM EST RP Workstation: [id number]    Microbiology: Results for orders placed or performed during the hospital encounter of 08/06/24  Resp panel by RT-PCR (RSV, Flu A&B, Covid) Anterior Nasal Swab     Status: None   Collection Time: 08/06/24  8:09 PM   Specimen: Anterior Nasal Swab  Result Value Ref Range Status   SARS Coronavirus 2 by RT PCR NEGATIVE NEGATIVE Final   Influenza A by PCR NEGATIVE NEGATIVE Final   Influenza B by PCR NEGATIVE NEGATIVE Final    Comment: (NOTE) The Xpert Xpress SARS-CoV-2/FLU/RSV plus assay is intended as an aid in the diagnosis of influenza from Nasopharyngeal  swab specimens and should not be used as a sole basis for treatment. Nasal washings and aspirates are unacceptable for Xpert Xpress SARS-CoV-2/FLU/RSV testing.  Fact Sheet for Patients: bloggercourse.com  Fact Sheet for Healthcare Providers: seriousbroker.it  This test is not yet approved or cleared by the [place]  FDA and has been authorized for detection and/or diagnosis of SARS-CoV-2 by FDA under an Emergency Use Authorization (EUA). This EUA will remain in effect (meaning this test can be used) for the duration of the COVID-19 declaration under Section 564(b)(1) of the Act, 21 U.S.C. section 360bbb-3(b)(1), unless the authorization is terminated or revoked.     Resp Syncytial Virus by PCR NEGATIVE NEGATIVE Final    Comment: (NOTE) Fact Sheet for Patients: bloggercourse.com  Fact Sheet for Healthcare Providers: seriousbroker.it  This test is not yet approved or cleared by the [place]  FDA and has been authorized for detection and/or diagnosis of SARS-CoV-2 by FDA under an Emergency Use Authorization (EUA). This EUA will remain in effect (meaning this test can be used) for the duration of the COVID-19 declaration under Section 564(b)(1) of the Act, 21 U.S.C. section 360bbb-3(b)(1), unless the authorization is terminated or revoked.  Performed at [facility], [address], [place], [place] [address]     Labs: CBC: Recent Labs  Lab 08/06/24 1810 08/06/24 1822 08/07/24 0322  WBC 20.3*  --  17.0*  HGB 15.5* 16.7*  16.7* 12.9  HCT 44.3 49.0*  49.0* 36.0  MCV 84.2  --  82.2  PLT 413*  --  322   Basic Metabolic Panel: Recent Labs  Lab 08/06/24 1810 08/06/24 1822 08/07/24 0322 08/08/24 0337  NA 132* 131*  132* 136 133*  K 4.4 3.5  3.5 3.1* 3.2*  CL 89* 95* 98 98  CO2 18*  --  27 25  GLUCOSE 267* 278* 125* 279*  BUN 16 20 12 8   CREATININE  1.06* 0.80 0.75 0.89  CALCIUM  11.1*  --  9.2 9.0  MG  --   --  1.7 1.8   Liver Function Tests: Recent Labs  Lab 08/07/24 0322  AST 22  ALT 22  ALKPHOS 92  BILITOT 0.8  PROT 6.6  ALBUMIN 4.0   CBG: Recent Labs  Lab 08/07/24 1630 08/07/24 2117 08/07/24 2336 08/08/24 0440 08/08/24 0757  GLUCAP 223* 270* 379* 292*  196*    Discharge time spent: greater than 30 minutes.  Signed: Nydia Distance, MD [facility] 08/08/2024 "

## 2024-08-08 NOTE — Inpatient Diabetes Management (Signed)
 Inpatient Diabetes Program Recommendations  AACE/ADA: New Consensus Statement on Inpatient Glycemic Control (2015)  Target Ranges:  Prepandial:   less than 140 mg/dL      Peak postprandial:   less than 180 mg/dL (1-2 hours)      Critically ill patients:  140 - 180 mg/dL   Lab Results  Component Value Date   GLUCAP 196 (H) 08/08/2024   HGBA1C 9.9 (H) 08/08/2024    Review of Glycemic Control  Latest Reference Range & Units 08/07/24 12:56 08/07/24 16:30 08/07/24 21:17 08/07/24 23:36 08/08/24 04:40 08/08/24 07:57  Glucose-Capillary 70 - 99 mg/dL 820 (H) 776 (H) 729 (H) 379 (H) 292 (H) 196 (H)  (H): Data is abnormally high Diabetes history: Type 1 DM Outpatient Diabetes medications: Lantus  12 units every day, Humalog  4 units TID (out of prescription 2-wks) Current orders for Inpatient glycemic control: Novolog  0-9 units TID, Lantus  10 units QD  Inpatient Diabetes Program Recommendations:    Discharge Recommendations: Long acting recommendations: Insulin  Glargine (LANTUS ) Solostar Pen 10 units QD  Short acting recommendations:  Meal + Correction coverage Insulin  lispro (HUMALOG ) KwikPen  Sensitive Scale.  VERY sensitive, 3-4 units TID (Assuming patietn consuming >50% of meals) Supply/Referral recommendations: Pen needles - standard   Use Adult Diabetes Insulin  Treatment Post Discharge order set.   Thanks, Tinnie Minus, MSN, RNC-OB Diabetes Coordinator [phone] (8a-5p)

## 2024-08-26 ENCOUNTER — Encounter: Payer: Self-pay | Admitting: Nurse Practitioner

## 2024-08-26 ENCOUNTER — Ambulatory Visit: Admitting: Nurse Practitioner

## 2024-08-26 VITALS — BP 105/71 | HR 115 | Temp 97.1°F | Wt 123.4 lb

## 2024-08-26 DIAGNOSIS — F32A Depression, unspecified: Secondary | ICD-10-CM | POA: Insufficient documentation

## 2024-08-26 DIAGNOSIS — E876 Hypokalemia: Secondary | ICD-10-CM

## 2024-08-26 DIAGNOSIS — R000 Tachycardia, unspecified: Secondary | ICD-10-CM | POA: Diagnosis not present

## 2024-08-26 DIAGNOSIS — Z59819 Housing instability, housed unspecified: Secondary | ICD-10-CM | POA: Insufficient documentation

## 2024-08-26 DIAGNOSIS — D72829 Elevated white blood cell count, unspecified: Secondary | ICD-10-CM | POA: Diagnosis not present

## 2024-08-26 DIAGNOSIS — S32008A Other fracture of unspecified lumbar vertebra, initial encounter for closed fracture: Secondary | ICD-10-CM | POA: Insufficient documentation

## 2024-08-26 DIAGNOSIS — E1065 Type 1 diabetes mellitus with hyperglycemia: Secondary | ICD-10-CM | POA: Diagnosis not present

## 2024-08-26 DIAGNOSIS — F419 Anxiety disorder, unspecified: Secondary | ICD-10-CM

## 2024-08-26 DIAGNOSIS — S32009D Unspecified fracture of unspecified lumbar vertebra, subsequent encounter for fracture with routine healing: Secondary | ICD-10-CM

## 2024-08-26 DIAGNOSIS — S34109D Unspecified injury to unspecified level of lumbar spinal cord, subsequent encounter: Secondary | ICD-10-CM | POA: Diagnosis not present

## 2024-08-26 DIAGNOSIS — R112 Nausea with vomiting, unspecified: Secondary | ICD-10-CM | POA: Diagnosis not present

## 2024-08-26 LAB — POCT GLYCOSYLATED HEMOGLOBIN (HGB A1C): Hemoglobin A1C: 10.6 % — AB (ref 4.0–5.6)

## 2024-08-26 MED ORDER — DEXCOM G7 SENSOR MISC: 12.0000 [IU] | Freq: Every day | 3 refills | Status: AC

## 2024-08-26 NOTE — Assessment & Plan Note (Signed)
" °  Healing right L3 and L4 transverse process fractures, new since 05/16/2024 noted on CT Denies pain  "

## 2024-08-26 NOTE — Assessment & Plan Note (Signed)
" °    08/26/2024   10:30 AM 04/28/2024   11:15 AM 08/23/2022    2:41 PM 04/20/2022   10:38 AM  GAD 7 : Generalized Anxiety Score  Nervous, Anxious, on Edge 0 2  0  0   Control/stop worrying 0 0  0  0   Worry too much - different things 2 0  0  1   Trouble relaxing 0 0  0  1   Restless 0 0  0  0   Easily annoyed or irritable 0 1  0  1   Afraid - awful might happen 0 0  0  0   Total GAD 7 Score 2 3 0 3  Anxiety Difficulty Somewhat difficult Not difficult at all Not difficult at all Somewhat difficult     Data saved with a previous flowsheet row definition       Anxiety and depression Mild anxiety and depression, exacerbated by living situation. Previously on medication for depression, which was discontinued due to nausea. No marijuana use. Working with child psychotherapist for housing assistance. - Continue hydroxyzine  25 mg every 8 hours as needed. - Encouraged follow-up with child psychotherapist for housing assistance. - Discussed alternative medications for depression if needed. "

## 2024-08-26 NOTE — Assessment & Plan Note (Signed)
Currently asymptomatic.  Will monitor

## 2024-08-26 NOTE — Patient Instructions (Signed)
 1. Uncontrolled type 1 diabetes mellitus with hyperglycemia (HCC) (Primary) - Continuous Glucose Sensor (DEXCOM G7 SENSOR) MISC; Apply to skin as directed  Dispense: 1 each; Refill: 3 - POCT glycosylated hemoglobin (Hb A1C) - Microalbumin / creatinine urine ratio - Basic Metabolic Panel - Ambulatory referral to Endocrinology  2. Hypokalemia - Basic Metabolic Panel  3. Leukocytosis, unspecified type - CBC    [facility]   [phone]   It is important that you exercise regularly at least 30 minutes 5 times a week as tolerated  Think about what you will eat, plan ahead. Choose  clean, green, fresh or frozen over canned, processed or packaged foods which are more sugary, salty and fatty. 70 to 75% of food eaten should be vegetables and fruit. Three meals at set times with snacks allowed between meals, but they must be fruit or vegetables. Aim to eat over a 12 hour period , example 7 am to 7 pm, and STOP after  your last meal of the day. Drink water,generally about 64 ounces per day, no other drink is as healthy. Fruit juice is best enjoyed in a healthy way, by EATING the fruit.  Thanks for choosing [facility] we consider it a privelige to serve you.

## 2024-08-26 NOTE — Assessment & Plan Note (Addendum)
 Continue reglan , take 5mg  TID as ordered  and zofran  4mg  every 8 hours  as needed

## 2024-08-26 NOTE — Assessment & Plan Note (Signed)
"   she is working with a child psychotherapist to secure housing. She is not currently employed. "

## 2024-08-26 NOTE — Assessment & Plan Note (Signed)
 Lab Results  Component Value Date   NA 133 (L) 08/08/2024   K 3.2 (L) 08/08/2024   CO2 25 08/08/2024   GLUCOSE 279 (H) 08/08/2024   BUN 8 08/08/2024   CREATININE 0.89 08/08/2024   CALCIUM  9.0 08/08/2024   EGFR 106 12/05/2022   GFRNONAA >60 08/08/2024   Rechecking labs

## 2024-08-26 NOTE — Assessment & Plan Note (Signed)
 Type 1 diabetes mellitus with hyperglycemia Recent hyperglycemia due to running out of Novolog , leading to hospitalization. Currently on Lantus  and sliding scale Novolog . Blood pressure controlled. No pain from previous fractures. No chest pain, shortness of breath, or acid reflux. - Referred to endocrinologist for diabetes management. - Continue Lantus  10 units daily. - Continue sliding scale Novolog . Counseled on low carb diet - Ensure glucometer is available for blood glucose monitoring,

## 2024-08-27 ENCOUNTER — Ambulatory Visit: Payer: Self-pay | Admitting: Nurse Practitioner

## 2024-08-27 LAB — BASIC METABOLIC PANEL WITH GFR
BUN/Creatinine Ratio: 12 (ref 9–23)
BUN: 13 mg/dL (ref 6–20)
CO2: 18 mmol/L — ABNORMAL LOW (ref 20–29)
Calcium: 10.6 mg/dL — ABNORMAL HIGH (ref 8.7–10.2)
Chloride: 95 mmol/L — ABNORMAL LOW (ref 96–106)
Creatinine, Ser: 1.06 mg/dL — ABNORMAL HIGH (ref 0.57–1.00)
Glucose: 276 mg/dL — ABNORMAL HIGH (ref 70–99)
Potassium: 4.7 mmol/L (ref 3.5–5.2)
Sodium: 135 mmol/L (ref 134–144)
eGFR: 74 mL/min/{1.73_m2}

## 2024-08-27 LAB — CBC
Hematocrit: 47.3 % — ABNORMAL HIGH (ref 34.0–46.6)
Hemoglobin: 15.1 g/dL (ref 11.1–15.9)
MCH: 29 pg (ref 26.6–33.0)
MCHC: 31.9 g/dL (ref 31.5–35.7)
MCV: 91 fL (ref 79–97)
Platelets: 370 10*3/uL (ref 150–450)
RBC: 5.2 x10E6/uL (ref 3.77–5.28)
RDW: 12.6 % (ref 11.7–15.4)
WBC: 7.5 10*3/uL (ref 3.4–10.8)

## 2024-08-27 LAB — MICROALBUMIN / CREATININE URINE RATIO
Creatinine, Urine: 85.5 mg/dL
Microalb/Creat Ratio: 5 mg/g{creat} (ref 0–29)
Microalbumin, Urine: 4.7 ug/mL

## 2024-09-02 ENCOUNTER — Encounter

## 2024-09-16 ENCOUNTER — Ambulatory Visit: Payer: Self-pay | Admitting: Nurse Practitioner

## 2024-11-18 ENCOUNTER — Encounter

## 2024-12-03 ENCOUNTER — Encounter: Payer: Self-pay | Admitting: Nurse Practitioner
